# Patient Record
Sex: Female | Born: 1935 | Race: White | Hispanic: No | Marital: Married | Smoking: Former smoker
Health system: Southern US, Community
[De-identification: ages and names within clinical notes are randomized; demographics above are authoritative.]

## PROBLEM LIST (undated history)

## (undated) DIAGNOSIS — N183 Chronic kidney disease, stage 3 unspecified: Secondary | ICD-10-CM

## (undated) DIAGNOSIS — N63 Unspecified lump in unspecified breast: Secondary | ICD-10-CM

## (undated) DIAGNOSIS — R519 Headache, unspecified: Secondary | ICD-10-CM

## (undated) DIAGNOSIS — I10 Essential (primary) hypertension: Secondary | ICD-10-CM

## (undated) DIAGNOSIS — Z8679 Personal history of other diseases of the circulatory system: Secondary | ICD-10-CM

## (undated) DIAGNOSIS — E119 Type 2 diabetes mellitus without complications: Secondary | ICD-10-CM

## (undated) DIAGNOSIS — M797 Fibromyalgia: Secondary | ICD-10-CM

## (undated) DIAGNOSIS — Z932 Ileostomy status: Secondary | ICD-10-CM

## (undated) DIAGNOSIS — D649 Anemia, unspecified: Secondary | ICD-10-CM

## (undated) DIAGNOSIS — R51 Headache: Secondary | ICD-10-CM

## (undated) DIAGNOSIS — F329 Major depressive disorder, single episode, unspecified: Secondary | ICD-10-CM

## (undated) DIAGNOSIS — R079 Chest pain, unspecified: Secondary | ICD-10-CM

## (undated) DIAGNOSIS — E785 Hyperlipidemia, unspecified: Secondary | ICD-10-CM

## (undated) DIAGNOSIS — F32A Depression, unspecified: Secondary | ICD-10-CM

## (undated) DIAGNOSIS — H539 Unspecified visual disturbance: Secondary | ICD-10-CM

## (undated) DIAGNOSIS — Z972 Presence of dental prosthetic device (complete) (partial): Secondary | ICD-10-CM

## (undated) DIAGNOSIS — K219 Gastro-esophageal reflux disease without esophagitis: Secondary | ICD-10-CM

## (undated) DIAGNOSIS — I82419 Acute embolism and thrombosis of unspecified femoral vein: Secondary | ICD-10-CM

## (undated) DIAGNOSIS — C189 Malignant neoplasm of colon, unspecified: Secondary | ICD-10-CM

## (undated) DIAGNOSIS — E039 Hypothyroidism, unspecified: Secondary | ICD-10-CM

## (undated) DIAGNOSIS — M1990 Unspecified osteoarthritis, unspecified site: Secondary | ICD-10-CM

## (undated) DIAGNOSIS — Z8551 Personal history of malignant neoplasm of bladder: Secondary | ICD-10-CM

## (undated) DIAGNOSIS — R001 Bradycardia, unspecified: Secondary | ICD-10-CM

## (undated) DIAGNOSIS — K56609 Unspecified intestinal obstruction, unspecified as to partial versus complete obstruction: Secondary | ICD-10-CM

## (undated) DIAGNOSIS — K220 Achalasia of cardia: Secondary | ICD-10-CM

## (undated) DIAGNOSIS — D500 Iron deficiency anemia secondary to blood loss (chronic): Secondary | ICD-10-CM

## (undated) DIAGNOSIS — K635 Polyp of colon: Secondary | ICD-10-CM

## (undated) DIAGNOSIS — IMO0001 Reserved for inherently not codable concepts without codable children: Secondary | ICD-10-CM

## (undated) DIAGNOSIS — K660 Peritoneal adhesions (postprocedural) (postinfection): Secondary | ICD-10-CM

## (undated) DIAGNOSIS — Z8709 Personal history of other diseases of the respiratory system: Secondary | ICD-10-CM

## (undated) DIAGNOSIS — K469 Unspecified abdominal hernia without obstruction or gangrene: Secondary | ICD-10-CM

## (undated) DIAGNOSIS — Z5189 Encounter for other specified aftercare: Secondary | ICD-10-CM

## (undated) DIAGNOSIS — I447 Left bundle-branch block, unspecified: Secondary | ICD-10-CM

## (undated) HISTORY — DX: Malignant neoplasm of colon, unspecified: C18.9

## (undated) HISTORY — PX: ABDOMINAL HYSTERECTOMY: SHX81

## (undated) HISTORY — DX: Presence of dental prosthetic device (complete) (partial): Z97.2

## (undated) HISTORY — DX: Anemia, unspecified: D64.9

## (undated) HISTORY — PX: APPENDECTOMY: SHX54

## (undated) HISTORY — PX: BREAST SURGERY: SHX581

## (undated) HISTORY — PX: COLON SURGERY: SHX602

## (undated) HISTORY — DX: Chronic kidney disease, stage 3 (moderate): N18.3

## (undated) HISTORY — DX: Bradycardia, unspecified: R00.1

## (undated) HISTORY — DX: Unspecified abdominal hernia without obstruction or gangrene: K46.9

## (undated) HISTORY — DX: Achalasia of cardia: K22.0

## (undated) HISTORY — DX: Unspecified osteoarthritis, unspecified site: M19.90

## (undated) HISTORY — DX: Left bundle-branch block, unspecified: I44.7

## (undated) HISTORY — DX: Hyperlipidemia, unspecified: E78.5

## (undated) HISTORY — DX: Polyp of colon: K63.5

## (undated) HISTORY — DX: Personal history of other diseases of the respiratory system: Z87.09

## (undated) HISTORY — DX: Unspecified lump in unspecified breast: N63.0

## (undated) HISTORY — PX: ILEOSTOMY: SHX1783

## (undated) HISTORY — PX: ROTATOR CUFF REPAIR: SHX139

## (undated) HISTORY — DX: Unspecified intestinal obstruction, unspecified as to partial versus complete obstruction: K56.609

## (undated) HISTORY — PX: CHOLECYSTECTOMY: SHX55

## (undated) HISTORY — DX: Chest pain, unspecified: R07.9

## (undated) HISTORY — DX: Type 2 diabetes mellitus without complications: E11.9

## (undated) HISTORY — DX: Iron deficiency anemia secondary to blood loss (chronic): D50.0

## (undated) HISTORY — DX: Encounter for other specified aftercare: Z51.89

## (undated) HISTORY — PX: TONSILLECTOMY: SUR1361

## (undated) HISTORY — PX: OTHER SURGICAL HISTORY: SHX169

## (undated) HISTORY — DX: Essential (primary) hypertension: I10

## (undated) HISTORY — PX: CORONARY ANGIOPLASTY: SHX604

## (undated) HISTORY — DX: Reserved for inherently not codable concepts without codable children: IMO0001

## (undated) HISTORY — DX: Personal history of malignant neoplasm of bladder: Z85.51

## (undated) HISTORY — DX: Hypothyroidism, unspecified: E03.9

## (undated) HISTORY — DX: Acute embolism and thrombosis of unspecified femoral vein: I82.419

## (undated) HISTORY — DX: Personal history of other diseases of the circulatory system: Z86.79

## (undated) HISTORY — DX: Chronic kidney disease, stage 3 unspecified: N18.30

---

## 2000-12-10 ENCOUNTER — Ambulatory Visit (HOSPITAL_COMMUNITY): Admission: RE | Admit: 2000-12-10 | Discharge: 2000-12-10 | Payer: Self-pay | Admitting: Cardiology

## 2000-12-10 HISTORY — PX: CARDIAC CATHETERIZATION: SHX172

## 2002-05-24 ENCOUNTER — Other Ambulatory Visit: Admission: RE | Admit: 2002-05-24 | Discharge: 2002-05-24 | Payer: Self-pay | Admitting: Orthopaedic Surgery

## 2002-08-25 ENCOUNTER — Ambulatory Visit (HOSPITAL_COMMUNITY): Admission: RE | Admit: 2002-08-25 | Discharge: 2002-08-25 | Payer: Self-pay | Admitting: Orthopaedic Surgery

## 2002-12-20 ENCOUNTER — Other Ambulatory Visit: Admission: RE | Admit: 2002-12-20 | Discharge: 2002-12-20 | Payer: Self-pay | Admitting: Orthopaedic Surgery

## 2003-03-03 ENCOUNTER — Other Ambulatory Visit: Admission: RE | Admit: 2003-03-03 | Discharge: 2003-03-03 | Payer: Self-pay | Admitting: Orthopaedic Surgery

## 2004-07-26 ENCOUNTER — Ambulatory Visit (HOSPITAL_COMMUNITY): Admission: RE | Admit: 2004-07-26 | Discharge: 2004-07-26 | Payer: Self-pay | Admitting: Orthopaedic Surgery

## 2006-02-10 HISTORY — PX: JOINT REPLACEMENT: SHX530

## 2006-07-02 ENCOUNTER — Inpatient Hospital Stay (HOSPITAL_COMMUNITY): Admission: RE | Admit: 2006-07-02 | Discharge: 2006-07-07 | Payer: Self-pay | Admitting: Orthopedic Surgery

## 2006-07-07 ENCOUNTER — Inpatient Hospital Stay: Admission: AD | Admit: 2006-07-07 | Discharge: 2006-07-09 | Payer: Self-pay | Admitting: Internal Medicine

## 2007-04-13 ENCOUNTER — Encounter: Admission: RE | Admit: 2007-04-13 | Discharge: 2007-04-13 | Payer: Self-pay | Admitting: Gastroenterology

## 2007-05-05 ENCOUNTER — Ambulatory Visit (HOSPITAL_COMMUNITY): Admission: RE | Admit: 2007-05-05 | Discharge: 2007-05-05 | Payer: Self-pay | Admitting: Gastroenterology

## 2007-05-05 ENCOUNTER — Encounter (INDEPENDENT_AMBULATORY_CARE_PROVIDER_SITE_OTHER): Payer: Self-pay | Admitting: Gastroenterology

## 2007-05-18 ENCOUNTER — Encounter: Admission: RE | Admit: 2007-05-18 | Discharge: 2007-05-18 | Payer: Self-pay | Admitting: Cardiology

## 2007-05-28 ENCOUNTER — Encounter: Admission: RE | Admit: 2007-05-28 | Discharge: 2007-05-28 | Payer: Self-pay | Admitting: Cardiology

## 2007-06-11 ENCOUNTER — Encounter (INDEPENDENT_AMBULATORY_CARE_PROVIDER_SITE_OTHER): Payer: Self-pay | Admitting: Interventional Radiology

## 2007-06-11 ENCOUNTER — Encounter: Admission: RE | Admit: 2007-06-11 | Discharge: 2007-06-11 | Payer: Self-pay | Admitting: Otolaryngology

## 2007-06-11 ENCOUNTER — Other Ambulatory Visit: Admission: RE | Admit: 2007-06-11 | Discharge: 2007-06-11 | Payer: Self-pay | Admitting: Interventional Radiology

## 2007-07-12 ENCOUNTER — Encounter: Admission: RE | Admit: 2007-07-12 | Discharge: 2007-07-12 | Payer: Self-pay | Admitting: Orthopedic Surgery

## 2007-08-25 ENCOUNTER — Encounter: Admission: RE | Admit: 2007-08-25 | Discharge: 2007-08-25 | Payer: Self-pay | Admitting: Gastroenterology

## 2008-08-21 ENCOUNTER — Inpatient Hospital Stay (HOSPITAL_COMMUNITY): Admission: EM | Admit: 2008-08-21 | Discharge: 2008-11-01 | Payer: Self-pay | Admitting: Pulmonary Disease

## 2008-08-21 ENCOUNTER — Ambulatory Visit: Payer: Self-pay | Admitting: Pulmonary Disease

## 2008-08-21 ENCOUNTER — Encounter (INDEPENDENT_AMBULATORY_CARE_PROVIDER_SITE_OTHER): Payer: Self-pay | Admitting: General Surgery

## 2008-08-29 ENCOUNTER — Ambulatory Visit: Payer: Self-pay | Admitting: Physical Medicine & Rehabilitation

## 2008-09-02 ENCOUNTER — Encounter (INDEPENDENT_AMBULATORY_CARE_PROVIDER_SITE_OTHER): Payer: Self-pay | Admitting: Internal Medicine

## 2008-09-03 ENCOUNTER — Ambulatory Visit: Payer: Self-pay | Admitting: Infectious Diseases

## 2008-09-04 ENCOUNTER — Encounter: Payer: Self-pay | Admitting: Cardiology

## 2008-11-06 ENCOUNTER — Ambulatory Visit (HOSPITAL_COMMUNITY): Admission: RE | Admit: 2008-11-06 | Discharge: 2008-11-06 | Payer: Self-pay | Admitting: General Surgery

## 2008-11-24 ENCOUNTER — Inpatient Hospital Stay (HOSPITAL_COMMUNITY): Admission: EM | Admit: 2008-11-24 | Discharge: 2008-12-05 | Payer: Self-pay | Admitting: Emergency Medicine

## 2008-11-25 ENCOUNTER — Ambulatory Visit: Payer: Self-pay | Admitting: Vascular Surgery

## 2008-11-25 ENCOUNTER — Encounter: Payer: Self-pay | Admitting: Cardiology

## 2008-11-28 ENCOUNTER — Encounter: Payer: Self-pay | Admitting: Cardiology

## 2008-12-20 ENCOUNTER — Ambulatory Visit (HOSPITAL_COMMUNITY): Admission: RE | Admit: 2008-12-20 | Discharge: 2008-12-20 | Payer: Self-pay | Admitting: General Surgery

## 2009-01-08 ENCOUNTER — Ambulatory Visit (HOSPITAL_COMMUNITY): Admission: RE | Admit: 2009-01-08 | Discharge: 2009-01-08 | Payer: Self-pay | Admitting: General Surgery

## 2009-02-16 ENCOUNTER — Encounter: Admission: RE | Admit: 2009-02-16 | Discharge: 2009-02-16 | Payer: Self-pay | Admitting: General Surgery

## 2009-08-03 ENCOUNTER — Ambulatory Visit: Payer: Self-pay | Admitting: Genetic Counselor

## 2009-09-20 ENCOUNTER — Ambulatory Visit: Payer: Self-pay | Admitting: Cardiology

## 2009-10-25 IMAGING — CR DG SINUS / FISTULA TRACT / ABSCESSOGRAM
1 series · 1 of 1 positions shown · non-contrast
Comparison: CT 09/04/2008.

CLINICAL DATA: History given of sepsis, colon carcinoma and
fistula.

SINUS TRACT INJECTION/FISTULOGRAM
TECHNIQUE: A Jackson-Pratt drain has been previously placed into
the percutaneous fistulous tract. A total of 50ml of Omnipaquewere
slowly injected. Spot film images were obtained.
Fluoroscopy Time: 2.3 minutes.

[t abdomen supine]
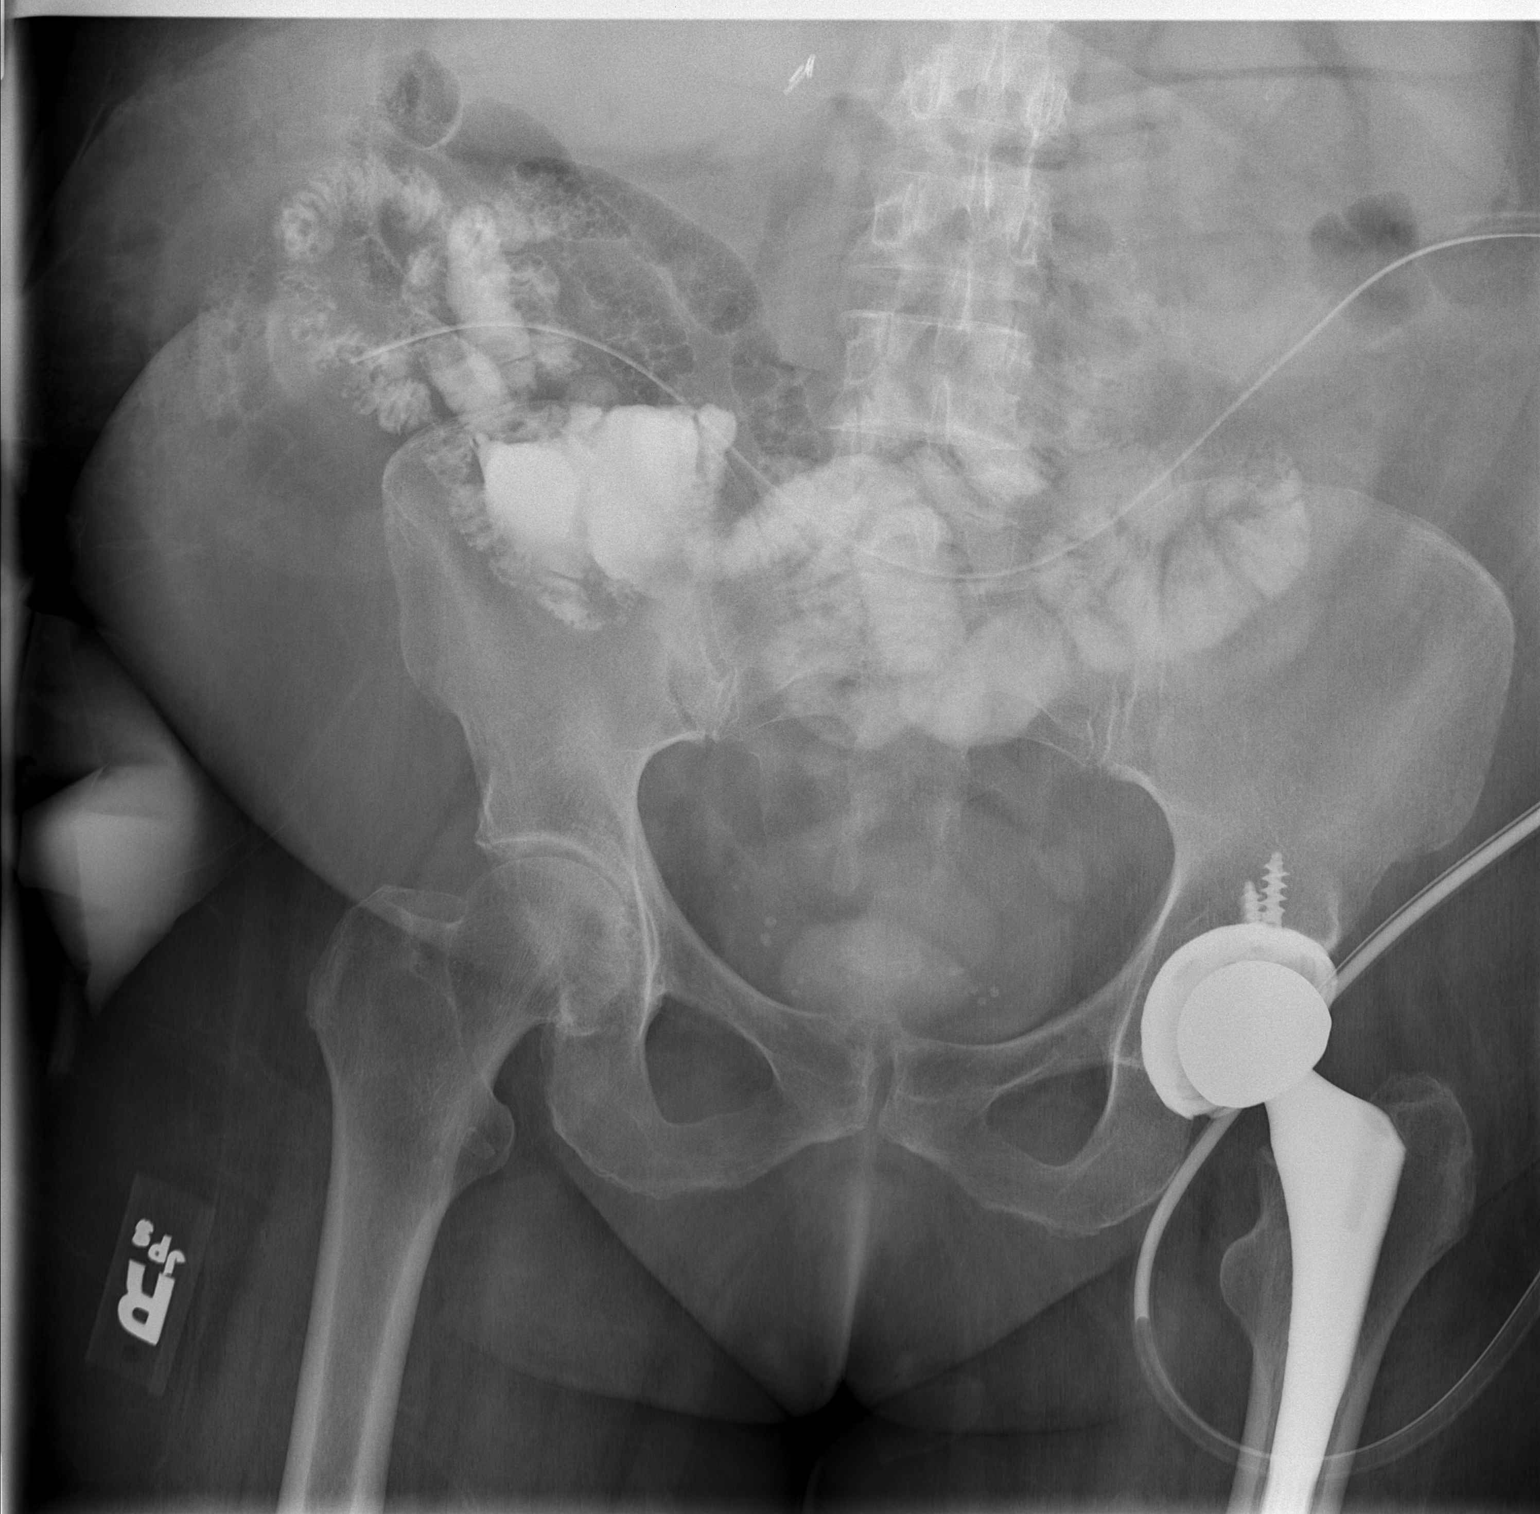

[1 of 1 positions shown; findings below may reference images not displayed]

FINDINGS: The catheter which was attached to the Jackson-Pratt
drain on the left was injected with contrast .  There was immediate
filling of loops of jejunum in the left lower abdomen.  With
further injection of contrast loops of ileum were separately
opacified in the right lower quadrant.  Delayed images were also
obtained again demonstrating connections with intestine.
IMPRESSION: Enterocutaneous fistulae.  With a single injection there is
connection with jejunum in the left lower quadrant and ileum in the
right lower quadrant.

## 2009-11-02 IMAGING — CR DG HIP (WITH OR WITHOUT PELVIS) 2-3V*L*
3 series · 3 of 3 positions shown · non-contrast
Comparison: Left hip radiographs performed 07/02/2006, and
abdominal radiograph performed 10/24/2008

CLINICAL DATA: Left hip pain; history of colon cancer and sepsis.

LEFT HIP - COMPLETE 2+ VIEW

[t pelvis a.p.]
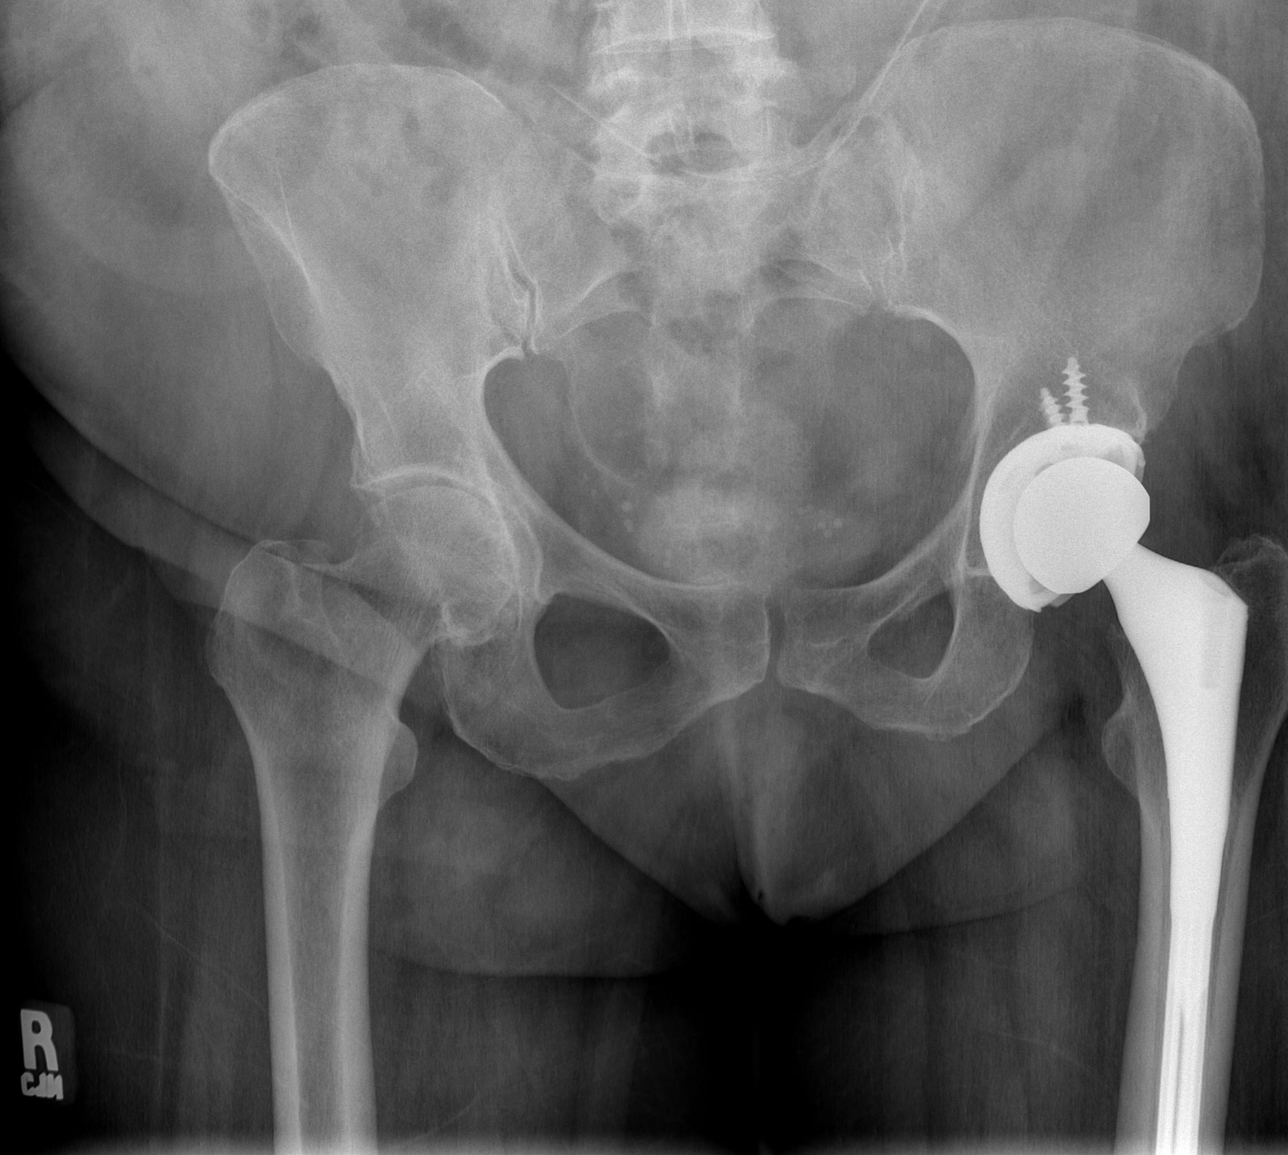

[t hip ap left]
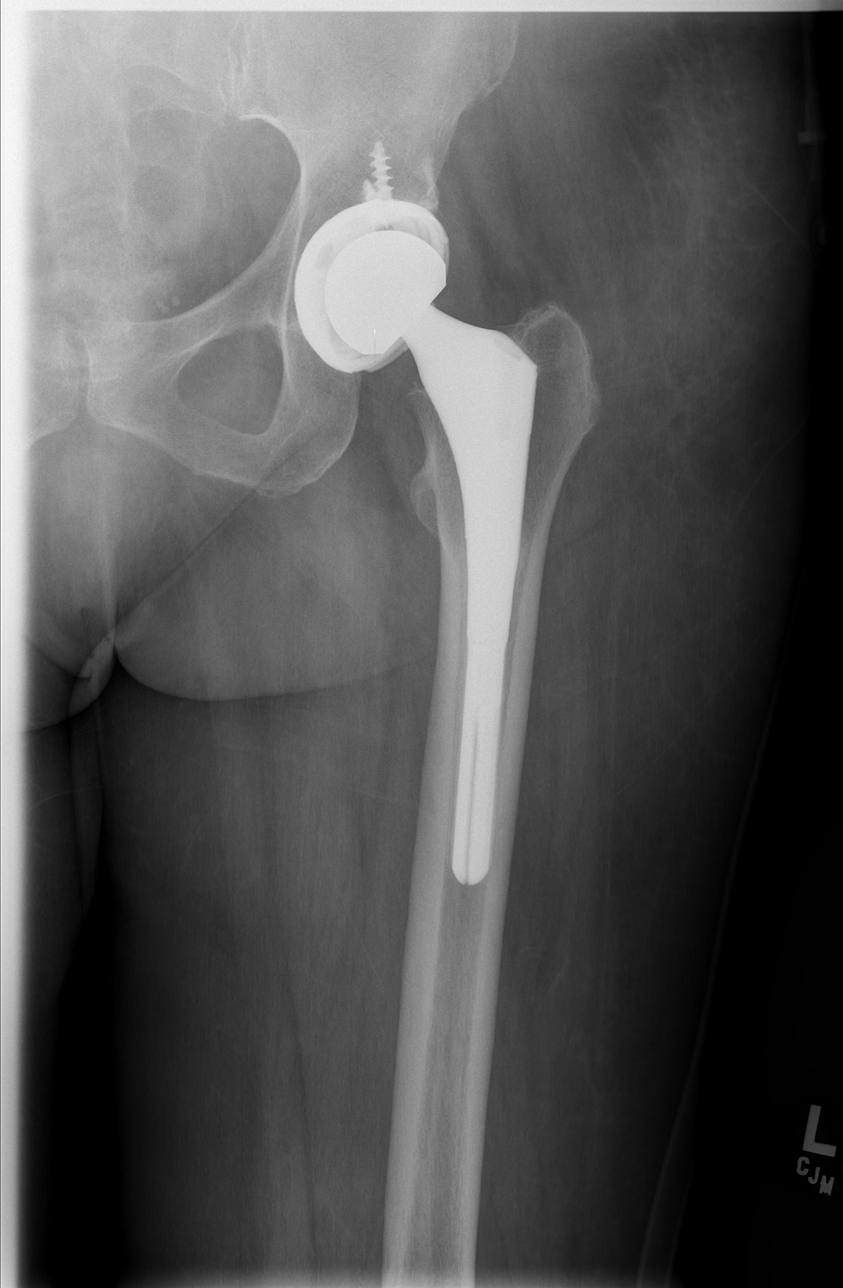

[t hip frog leg left]
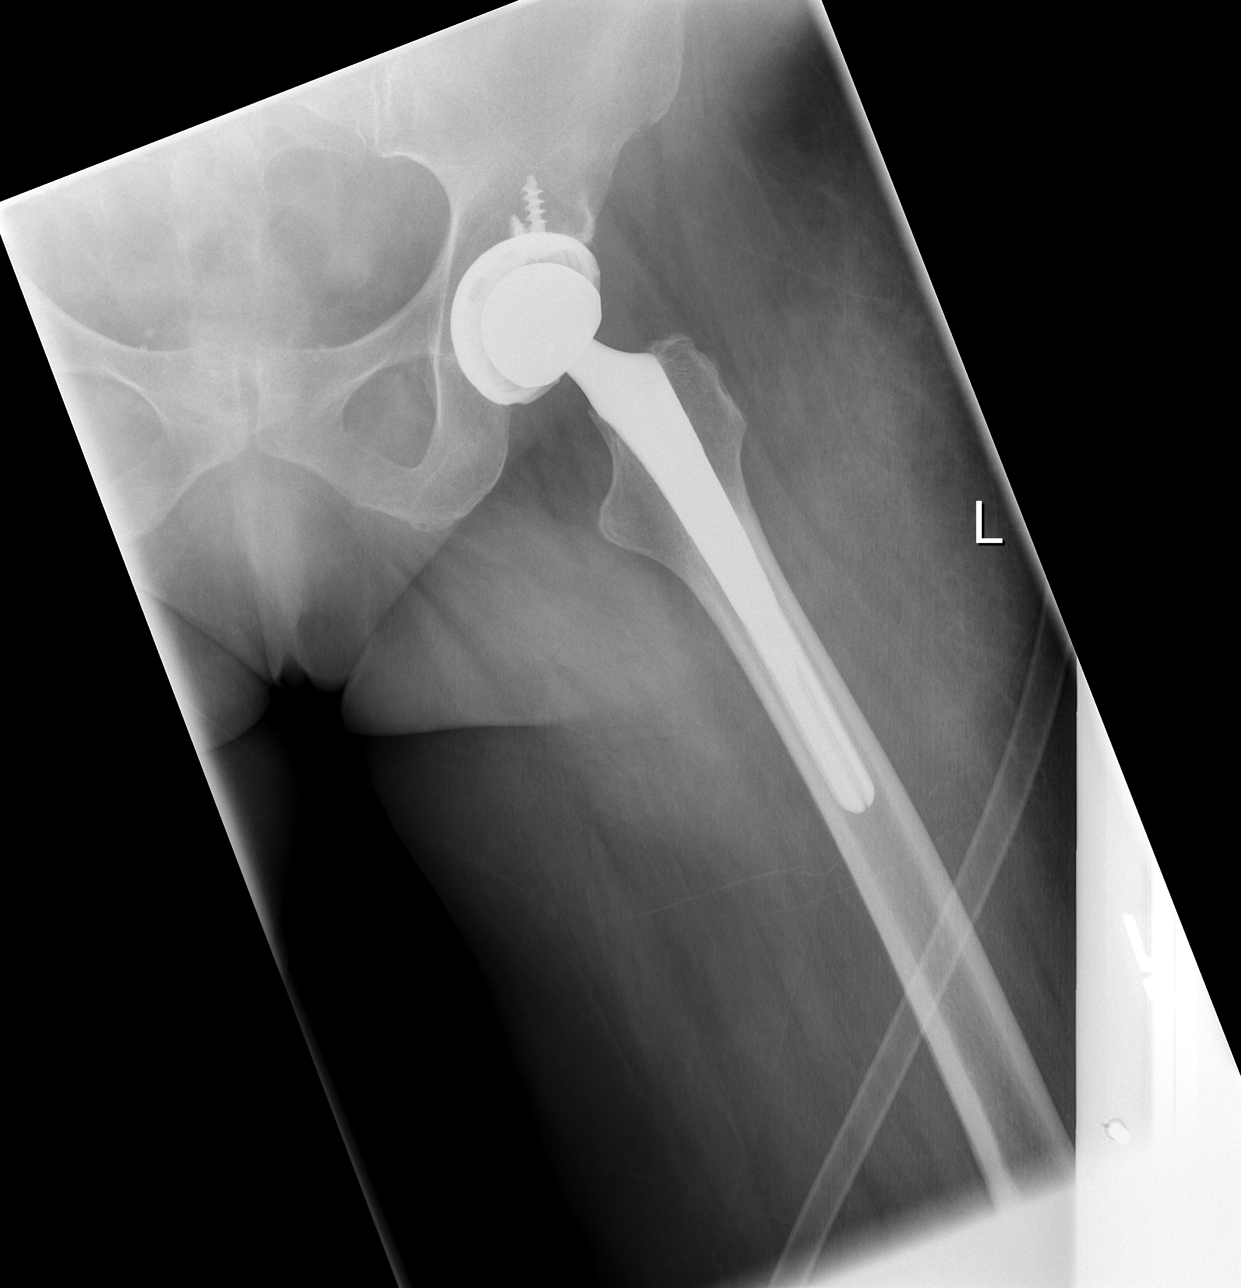

[3 of 3 positions shown; findings below may reference images not displayed]

FINDINGS: The patient's left total hip arthroplasty is unchanged in
appearance.  There is no evidence of loosening or fracture.  The
femoral stem is unremarkable.  No significant soft tissue
abnormalities are seen.  Axial narrowing is noted at the right hip,
reflecting degenerative change.

An abdominal drain is unchanged in appearance from the prior
abdominal radiograph. The visualized bowel gas pattern is grossly
unremarkable.
IMPRESSION: No evidence of fracture; left total hip arthroplasty unremarkable
in appearance.

## 2009-11-27 ENCOUNTER — Ambulatory Visit: Payer: Self-pay | Admitting: Cardiology

## 2010-01-14 ENCOUNTER — Ambulatory Visit: Payer: Self-pay | Admitting: Internal Medicine

## 2010-01-15 IMAGING — XA IR FISTULA/SINUS TRACT
1 series · 6 of 6 positions shown · non-contrast
Comparison: none

CLINICAL HISTORY: History of small bowel fistula.

[Series 1: run · 6 of 6 slices shown]
[im 1/6]
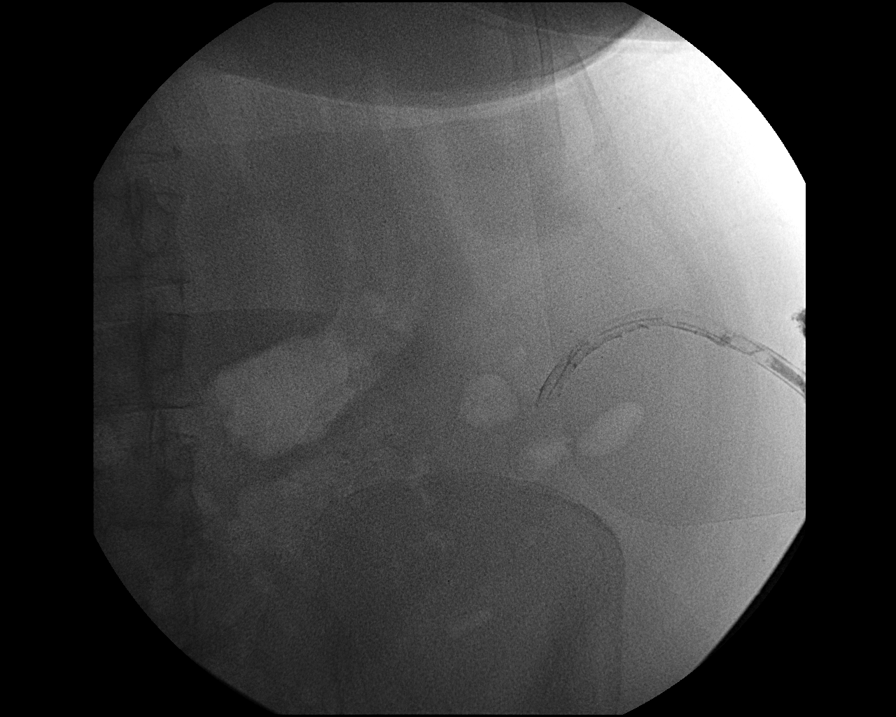
[im 2/6]
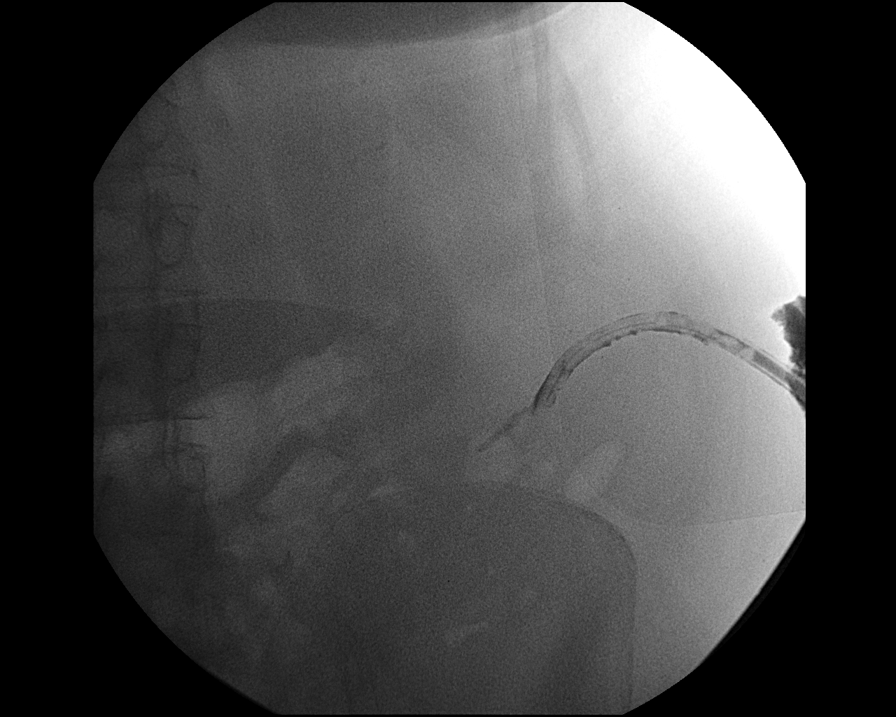
[im 3/6]
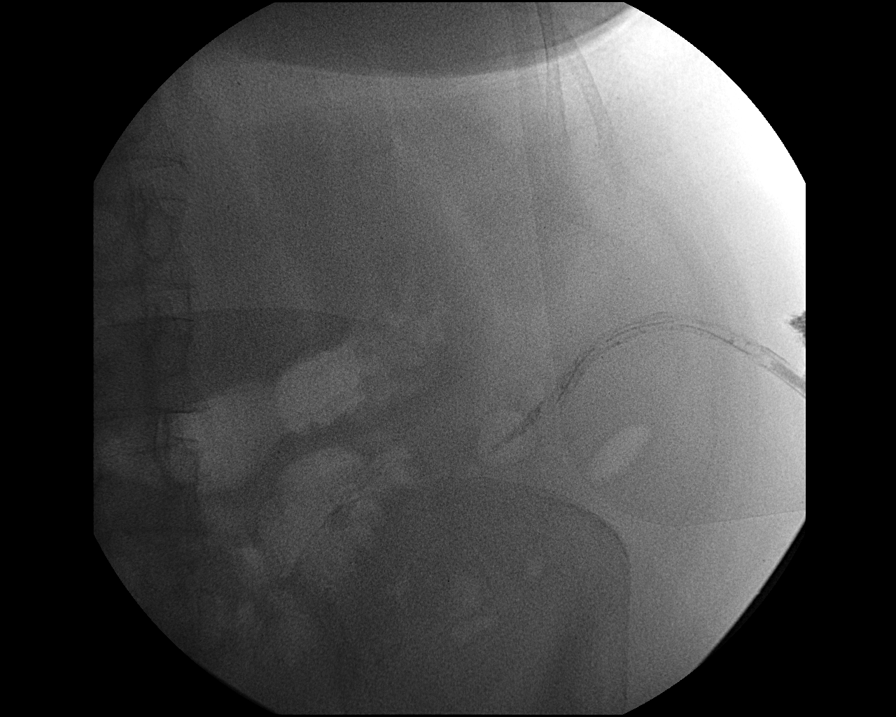
[im 4/6]
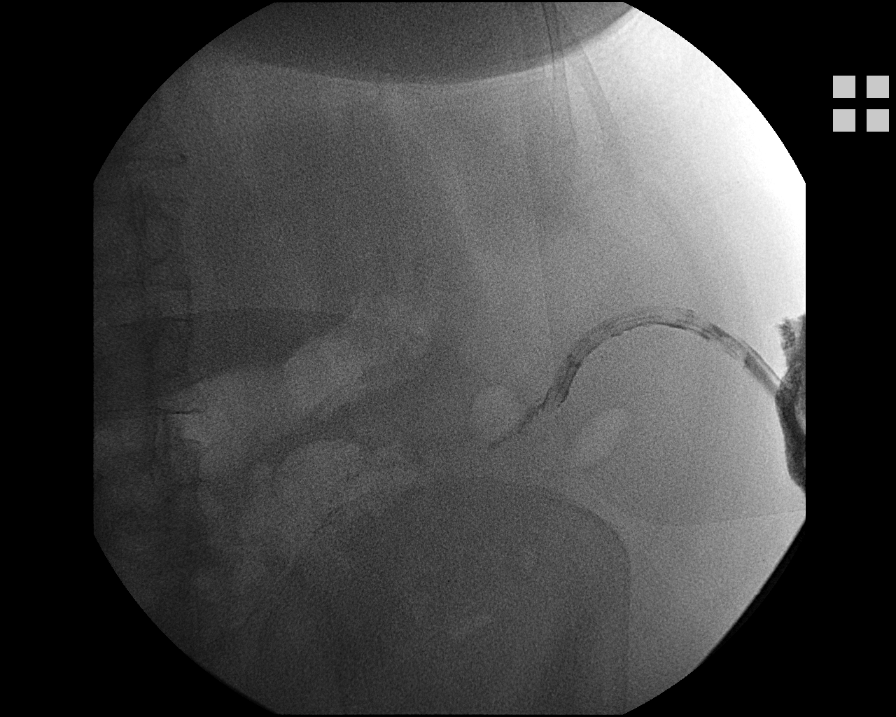
[im 5/6]
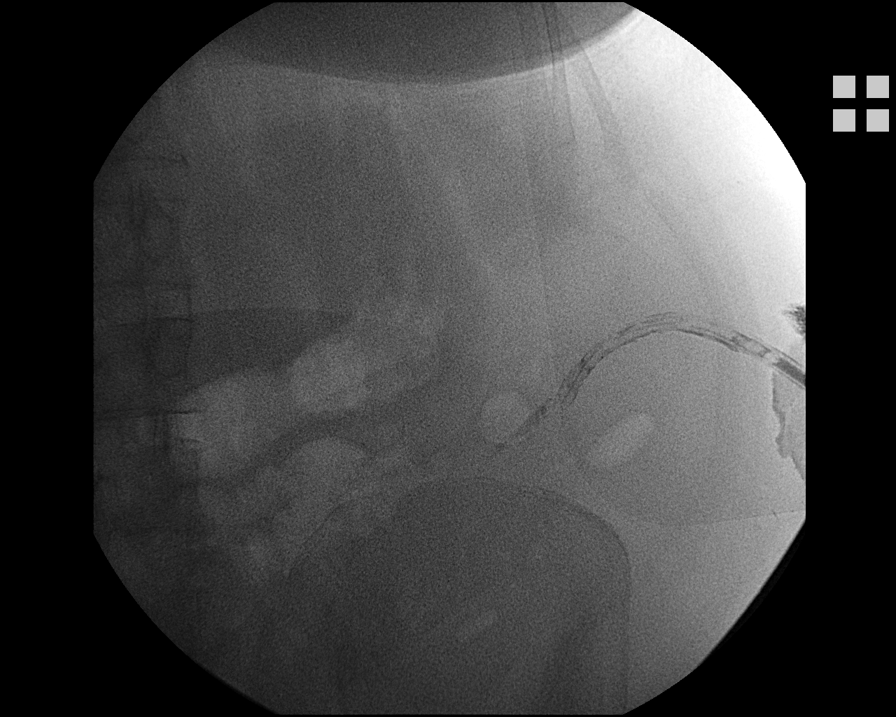
[im 6/6]
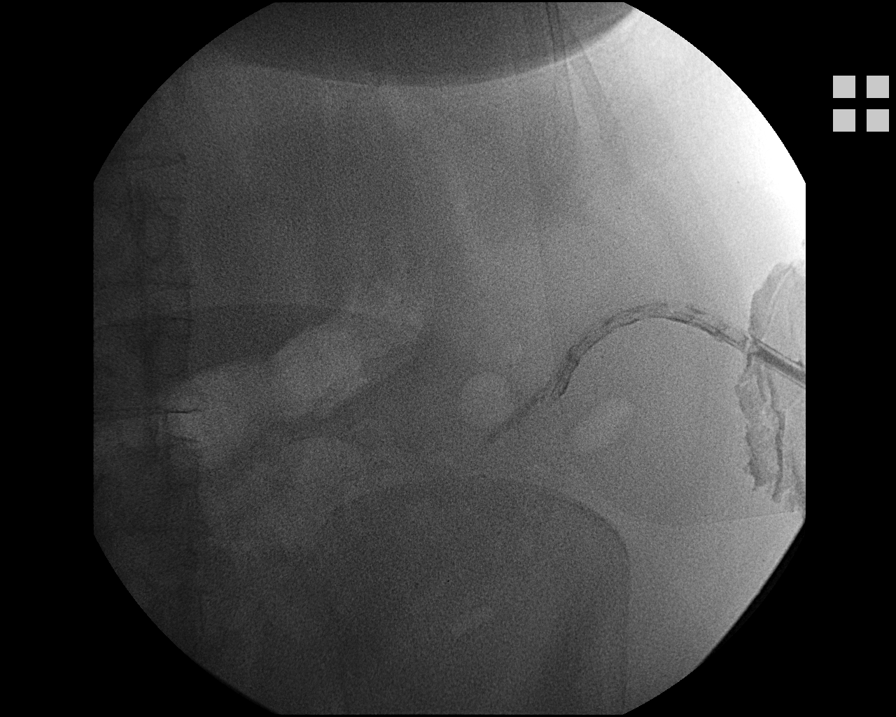

[6 of 6 positions shown; findings below may reference images not displayed]

PROCEDURE(S): FISTULOGRAM THROUGH EXISTING CATHETER

Medications:None

Moderate sedation time:None

Fluoroscopy time: 0.9 minutes

Contrast:  15 ml Fmnipaque-6NN

Procedure:The patient was placed supine on the interventional
table.  Scout film was obtained of the left abdomen.  15 ml of
contrast was injected under fluoroscopic guidance.  The patient was
taken to the nursing station.  The catheter suture was cut and the
catheter was removed.
FINDINGS: Multiple filling defects within the left abdominal drain
and a large amount of the contrast leaked around the skin.  Small
amount of contrast was injected into the abdomen and there was no
connection to the small bowel.
IMPRESSION: Partially occluded left abdominal drain.  No evidence
for a fistula tract to the small bowel.

Findings were discussed with Dr. Petraina and the catheter was
removed.

## 2010-03-03 ENCOUNTER — Encounter: Payer: Self-pay | Admitting: General Surgery

## 2010-03-04 ENCOUNTER — Ambulatory Visit: Payer: Self-pay | Admitting: Cardiology

## 2010-04-03 ENCOUNTER — Other Ambulatory Visit (HOSPITAL_COMMUNITY)
Admission: RE | Admit: 2010-04-03 | Discharge: 2010-04-03 | Disposition: A | Discharge status: Home / Self Care | Payer: Medicare Other | Source: Ambulatory Visit | Attending: Orthopaedic Surgery | Admitting: Orthopaedic Surgery

## 2010-04-03 ENCOUNTER — Other Ambulatory Visit: Payer: Self-pay | Admitting: Orthopaedic Surgery

## 2010-04-03 DIAGNOSIS — M6530 Trigger finger, unspecified finger: Secondary | ICD-10-CM | POA: Insufficient documentation

## 2010-04-15 ENCOUNTER — Inpatient Hospital Stay (HOSPITAL_COMMUNITY)
Admission: AD | Admit: 2010-04-15 | Discharge: 2010-04-19 | DRG: 389 | Disposition: A | Discharge status: Home / Self Care | Payer: Medicare Other | Source: Other Acute Inpatient Hospital | Attending: Surgery | Admitting: Surgery

## 2010-04-15 DIAGNOSIS — E039 Hypothyroidism, unspecified: Secondary | ICD-10-CM | POA: Diagnosis present

## 2010-04-15 DIAGNOSIS — I2510 Atherosclerotic heart disease of native coronary artery without angina pectoris: Secondary | ICD-10-CM | POA: Diagnosis present

## 2010-04-15 DIAGNOSIS — F411 Generalized anxiety disorder: Secondary | ICD-10-CM | POA: Diagnosis present

## 2010-04-15 DIAGNOSIS — N390 Urinary tract infection, site not specified: Secondary | ICD-10-CM | POA: Diagnosis not present

## 2010-04-15 DIAGNOSIS — E119 Type 2 diabetes mellitus without complications: Secondary | ICD-10-CM | POA: Diagnosis present

## 2010-04-15 DIAGNOSIS — K56609 Unspecified intestinal obstruction, unspecified as to partial versus complete obstruction: Principal | ICD-10-CM | POA: Diagnosis present

## 2010-04-15 DIAGNOSIS — Z8551 Personal history of malignant neoplasm of bladder: Secondary | ICD-10-CM

## 2010-04-15 DIAGNOSIS — E785 Hyperlipidemia, unspecified: Secondary | ICD-10-CM | POA: Diagnosis present

## 2010-04-15 DIAGNOSIS — Z85038 Personal history of other malignant neoplasm of large intestine: Secondary | ICD-10-CM

## 2010-04-15 DIAGNOSIS — Z932 Ileostomy status: Secondary | ICD-10-CM

## 2010-04-15 DIAGNOSIS — I10 Essential (primary) hypertension: Secondary | ICD-10-CM | POA: Diagnosis present

## 2010-04-15 DIAGNOSIS — K219 Gastro-esophageal reflux disease without esophagitis: Secondary | ICD-10-CM | POA: Diagnosis present

## 2010-04-15 DIAGNOSIS — N179 Acute kidney failure, unspecified: Secondary | ICD-10-CM | POA: Diagnosis present

## 2010-04-15 LAB — GLUCOSE, CAPILLARY: Glucose-Capillary: 127 mg/dL — ABNORMAL HIGH (ref 70–99)

## 2010-04-16 ENCOUNTER — Inpatient Hospital Stay (HOSPITAL_COMMUNITY): Payer: Medicare Other

## 2010-04-16 LAB — BASIC METABOLIC PANEL
BUN: 26 mg/dL — ABNORMAL HIGH (ref 6–23)
CO2: 26 mEq/L (ref 19–32)
Chloride: 108 mEq/L (ref 96–112)
Creatinine, Ser: 1.09 mg/dL (ref 0.4–1.2)
Glucose, Bld: 124 mg/dL — ABNORMAL HIGH (ref 70–99)

## 2010-04-16 LAB — CBC
HCT: 38.6 % (ref 36.0–46.0)
Hemoglobin: 12.5 g/dL (ref 12.0–15.0)
MCH: 28.7 pg (ref 26.0–34.0)
MCV: 88.7 fL (ref 78.0–100.0)
Platelets: 211 10*3/uL (ref 150–400)
RBC: 4.35 MIL/uL (ref 3.87–5.11)

## 2010-04-17 ENCOUNTER — Inpatient Hospital Stay (HOSPITAL_COMMUNITY): Payer: Medicare Other

## 2010-04-17 LAB — URINALYSIS, ROUTINE W REFLEX MICROSCOPIC
Glucose, UA: NEGATIVE mg/dL
Specific Gravity, Urine: 1.018 (ref 1.005–1.030)

## 2010-04-17 LAB — URINE MICROSCOPIC-ADD ON

## 2010-04-18 LAB — GLUCOSE, CAPILLARY
Glucose-Capillary: 130 mg/dL — ABNORMAL HIGH (ref 70–99)
Glucose-Capillary: 119 mg/dL — ABNORMAL HIGH (ref 70–99)

## 2010-04-20 NOTE — H&P (Signed)
NAME:  Karen [name], Karen [name]               ACCOUNT NO.:  [id number]  MEDICAL RECORD NO.:  [id number]           PATIENT TYPE:  I  LOCATION:  5508                         FACILITY:  [facility]  PHYSICIAN:  Adolph Pollack, M.D.DATE OF BIRTH:  [birth year]  DATE OF ADMISSION:  04/15/2010 DATE OF DISCHARGE:                             HISTORY & PHYSICAL   REASON:  Small-bowel obstruction.  HISTORY:  This is a 77 year old female with very complex abdominal surgical history who was in her normal state of health and awoke this morning with some upper abdominal pain that persisted.  She had bloating and decreased ileostomy output.  She began having some nausea.  She presented to [facility] emergency department, was evaluated there, and found to have a small bowel obstruction.  At patient's request she was transferred down here for further evaluation and treatment.  She states, since she has been here, she has had a little bit of output start from the ileostomy.  She had an NG tube placed at [facility].  Family says she is less distended now.  PAST MEDICAL HISTORY:  Coronary artery disease, hypertension, hypothyroidism, type 2 diabetes mellitus, colon cancer, gastroesophageal reflux disease, anxiety disorder, bladder cancer, dyslipidemia, intracutaneous fistulae, bowel perforation, acute renal failure, and ventilatory-dependent respiratory failure.  PREVIOUS OPERATIONS:  Right colectomy, left hemicolectomy, TAH/BSO, TURBT, right shoulder surgery, bilateral carpal tunnel release, small bowel resection with ileostomy, repair of anastomotic leak, open abdominal wound closure, right hand surgery, and left total hip replacement.  ALLERGIES:  CODEINE, DEMEROL, MORPHINE, PENTAZOCINE, NITROFURANTOIN, NOVOCAIN.  MEDICATIONS: 1. Acyclovir. 2. Alprazolam. 3. Aspirin. 4. Vicodin. 5. Digoxin. 6. Multivitamin. 7. L-thyroxine. 8. Folate. 9. Diovan. 10.Coreg. 11.Omeprazole.  SOCIAL HISTORY:   She is married,  lives with her husband.  Is currently nonsmoker, nondrinker.  REVIEW OF SYSTEMS:  GENERAL:  She denies any fever or chills. CARDIOVASCULAR:  No chest pain.  RESPIRATORY:  No shortness of breath. GI: She states she ate some raw vegetables and a hot dog yesterday.  She has an intermittent mucoid bowel movement.  GU:  No dysuria or hematuria.  PHYSICAL EXAMINATION:  GENERAL:  An overweight female in no acute distress, pleasant and cooperative.  NG tube in. VITAL SIGNS:  Temperature is 98.3, blood pressure is 184/65, pulse 78, and respiratory rate 20. NECK:  Supple without masses. CV:  Regular rate and regular rhythm, no murmurs. LUNGS:  Clear. ABDOMEN:  Soft.  Minimal tenderness present.  There is a transverse upper abdominal scar and a lower midline scar without obvious hernia although some weakness of the abdominal wall in the transverse upper abdominal scar.  There is a right-sided ileostomy present with a little bit of pasty output at the stoma site and a little bit of liquid in the bag.  Intermittent high-pitched bowel sounds are heard. MUSCULOSKELETAL:  No edema present.  LABORATORY DATA:  Electrolytes within normal limits except for glucose of 107.  White count 6700, hemoglobin 11.8.  Albumin 4.0.  Triglycerides elevated at 296.  Urinalysis negative.  CT scan was reviewed and showed dilated small-bowel loops consistent with small-bowel obstruction.  There was no free air  present.  IMPRESSION:  Small-bowel obstruction, most likely adhesive in nature. Also, has multiple other medical problems which appear to be well controlled at this time.  PLAN:  We will admit to the hospital and continue medical management with NG tube decompression and hydration.  Repeat lab and x-ray in the morning.  This would be a very difficult abdominal surgery, and I think only as very last resort would we recommend exploratory laparotomy to her, and this has been explained to her  and her family and they understand this.     Adolph Pollack, M.D.     Kari Baars  D:  04/15/2010  T:  04/16/2010  Job:  [id number]  Electronically Signed by Avel Peace M.D. on 04/20/2010 09:37:20 PM

## 2010-04-21 ENCOUNTER — Emergency Department (HOSPITAL_COMMUNITY): Payer: Medicare Other

## 2010-04-21 ENCOUNTER — Inpatient Hospital Stay (HOSPITAL_COMMUNITY)
Admission: EM | Admit: 2010-04-21 | Discharge: 2010-04-25 | DRG: 389 | Disposition: A | Discharge status: Home / Self Care | Payer: Medicare Other | Attending: Surgery | Admitting: Surgery

## 2010-04-21 DIAGNOSIS — Z7982 Long term (current) use of aspirin: Secondary | ICD-10-CM

## 2010-04-21 DIAGNOSIS — K219 Gastro-esophageal reflux disease without esophagitis: Secondary | ICD-10-CM | POA: Diagnosis present

## 2010-04-21 DIAGNOSIS — N289 Disorder of kidney and ureter, unspecified: Secondary | ICD-10-CM | POA: Diagnosis present

## 2010-04-21 DIAGNOSIS — E785 Hyperlipidemia, unspecified: Secondary | ICD-10-CM | POA: Diagnosis present

## 2010-04-21 DIAGNOSIS — Z8551 Personal history of malignant neoplasm of bladder: Secondary | ICD-10-CM

## 2010-04-21 DIAGNOSIS — I5032 Chronic diastolic (congestive) heart failure: Secondary | ICD-10-CM | POA: Diagnosis present

## 2010-04-21 DIAGNOSIS — E039 Hypothyroidism, unspecified: Secondary | ICD-10-CM | POA: Diagnosis present

## 2010-04-21 DIAGNOSIS — I509 Heart failure, unspecified: Secondary | ICD-10-CM | POA: Diagnosis present

## 2010-04-21 DIAGNOSIS — Z85038 Personal history of other malignant neoplasm of large intestine: Secondary | ICD-10-CM

## 2010-04-21 DIAGNOSIS — K56609 Unspecified intestinal obstruction, unspecified as to partial versus complete obstruction: Principal | ICD-10-CM | POA: Diagnosis present

## 2010-04-21 DIAGNOSIS — I10 Essential (primary) hypertension: Secondary | ICD-10-CM | POA: Diagnosis present

## 2010-04-21 DIAGNOSIS — F411 Generalized anxiety disorder: Secondary | ICD-10-CM | POA: Diagnosis present

## 2010-04-21 DIAGNOSIS — I2510 Atherosclerotic heart disease of native coronary artery without angina pectoris: Secondary | ICD-10-CM | POA: Diagnosis present

## 2010-04-21 LAB — POCT CARDIAC MARKERS
Myoglobin, poc: 98.9 ng/mL (ref 12–200)
Troponin i, poc: <0.05 ng/mL (ref 0.00–0.09)

## 2010-04-21 LAB — URINALYSIS, ROUTINE W REFLEX MICROSCOPIC
Glucose, UA: NEGATIVE mg/dL
pH: 5.5 (ref 5.0–8.0)

## 2010-04-21 LAB — CBC
MCH: 28.5 pg (ref 26.0–34.0)
MCV: 88.4 fL (ref 78.0–100.0)
Platelets: 207 10*3/uL (ref 150–400)
RBC: 3.97 MIL/uL (ref 3.87–5.11)
RDW: 13.7 % (ref 11.5–15.5)

## 2010-04-21 LAB — DIFFERENTIAL
Basophils Relative: 0 % (ref 0–1)
Eosinophils Absolute: 0.1 10*3/uL (ref 0.0–0.7)
Eosinophils Relative: 2 % (ref 0–5)
Lymphs Abs: 2.2 10*3/uL (ref 0.7–4.0)
Monocytes Relative: 10 % (ref 3–12)
Neutrophils Relative %: 58 % (ref 43–77)

## 2010-04-21 LAB — COMPREHENSIVE METABOLIC PANEL
AST: 28 U/L (ref 0–37)
Albumin: 3.7 g/dL (ref 3.5–5.2)
BUN: 31 mg/dL — ABNORMAL HIGH (ref 6–23)
Calcium: 9.1 mg/dL (ref 8.4–10.5)
Chloride: 110 mEq/L (ref 96–112)
Creatinine, Ser: 1.62 mg/dL — ABNORMAL HIGH (ref 0.4–1.2)
GFR calc Af Amer: 37 mL/min — ABNORMAL LOW (ref >60)
Total Protein: 6.9 g/dL (ref 6.0–8.3)

## 2010-04-21 MED ORDER — IOHEXOL 300 MG/ML  SOLN
75.0000 mL | Freq: Once | INTRAMUSCULAR | Status: AC | PRN
  Administered 2010-04-21: 75 mL via INTRAVENOUS

## 2010-04-22 ENCOUNTER — Inpatient Hospital Stay (HOSPITAL_COMMUNITY): Payer: Medicare Other

## 2010-04-22 LAB — BASIC METABOLIC PANEL
CO2: 24 mEq/L (ref 19–32)
Glucose, Bld: 117 mg/dL — ABNORMAL HIGH (ref 70–99)
Potassium: 4 mEq/L (ref 3.5–5.1)
Sodium: 139 mEq/L (ref 135–145)

## 2010-04-22 LAB — CBC
HCT: 34.1 % — ABNORMAL LOW (ref 36.0–46.0)
Hemoglobin: 11 g/dL — ABNORMAL LOW (ref 12.0–15.0)
RBC: 3.88 MIL/uL (ref 3.87–5.11)
WBC: 5.5 10*3/uL (ref 4.0–10.5)

## 2010-04-22 MED ORDER — IOHEXOL 300 MG/ML  SOLN
10.0000 mL | Freq: Once | INTRAMUSCULAR | Status: AC | PRN
  Administered 2010-04-22: 10 mL via INTRAVENOUS

## 2010-04-23 LAB — COMPREHENSIVE METABOLIC PANEL
ALT: 17 U/L (ref 0–35)
Albumin: 3 g/dL — ABNORMAL LOW (ref 3.5–5.2)
Alkaline Phosphatase: 73 U/L (ref 39–117)
BUN: 5 mg/dL — ABNORMAL LOW (ref 6–23)
Chloride: 111 mEq/L (ref 96–112)
Glucose, Bld: 109 mg/dL — ABNORMAL HIGH (ref 70–99)
Potassium: 3.6 mEq/L (ref 3.5–5.1)
Sodium: 142 mEq/L (ref 135–145)
Total Bilirubin: 0.4 mg/dL (ref 0.3–1.2)

## 2010-04-23 LAB — CBC
HCT: 31.3 % — ABNORMAL LOW (ref 36.0–46.0)
MCV: 87.9 fL (ref 78.0–100.0)
Platelets: 163 10*3/uL (ref 150–400)
RBC: 3.56 MIL/uL — ABNORMAL LOW (ref 3.87–5.11)
WBC: 5.6 10*3/uL (ref 4.0–10.5)

## 2010-04-23 NOTE — H&P (Signed)
NAME:  KYNSLEI, ART               ACCOUNT NO.:  [id number]  MEDICAL RECORD NO.:  [id number]           PATIENT TYPE:  I  LOCATION:  5118                         FACILITY:  [facility]  PHYSICIAN:  Almond Lint, MD       DATE OF BIRTH:  [birth year]  DATE OF ADMISSION:  04/21/2010 DATE OF DISCHARGE:                             HISTORY & PHYSICAL   REQUESTING PHYSICIAN:  Charles B. Bernette Mayers, MD  CONSULTING PHYSICIAN:  Lawrence Marseilles. [name] Royalty, MD  CHIEF COMPLAINT:  Decreased ostomy output and abdominal pain.  HISTORY OF PRESENT ILLNESS:  Ms. Bultema is a 77 year old female who has a long complicated surgical history which I will detail later who was recently admitted for a small bowel obstruction last week.  She went home 2 days ago and then last night had a recurrent abdominal pain and nausea, vomiting.  She did not improve this morning despite taking only liquids and so she returns to the hospital.  She describes crampy abdominal pain and shortness of breath.  Shortness of breath has now improved.  She continues to have bloating, abdominal pain, nausea, vomiting.  She has had no output in her ostomy bag since last night.  When she went home on Friday, she had tolerated a regular diet x2 meals with reasonable ostomy output.  She has not had a diarrhea, constipation or recent illnesses. She denies chest pain and also of note denies other symptoms x15 systems.  PAST MEDICAL HISTORY:  is significant for coronary artery disease, hypertension, hyperthyroidism, colon cancer, gastroesophageal reflux disease, anxiety, bladder cancer, dyslipidemia, enterocutaneous fistula, bowel perforation, acute renal failure in the past and ventilator dependent respiratory failure.  FAMILY HISTORY:  Father died at age 67 with leukemia.  Mother at 49 with stroke and bladder cancer.  She has had also a brother with coronary artery disease.  PAST SURGICAL HISTORY:  Most recently she has had a left  hemicolectomy, right hemicolectomy complicated by a leak at an outside hospital.  She was taken back to our hospital for small bowel resection and revision of ileocolonic anastomosis with ileostomy.  She then had another episode of bowel perforation and had repair of enterotomy and open abdomen.  She was taken back for abdominal wound VAC changes and then closure of the abdomen.  Additionally, she has had vaginal hysterectomy and oophorectomy, TURBT, right shoulder surgery, bilateral carpal tunnel release, left hand surgery, recent right hand surgery, left total hip replacement, right breast biopsy in 2000, right knee arthroscopy in 1998, lap chole in 1993, vein stripping bilaterally in 1957, back surgery in 1966.  ALLERGIES:  CODEINE, DEMEROL, MORPHINE, PENTAZOCINE, NITROFURANTOIN, NOVOCAINE.  MEDICATIONS:  Acyclovir, Vicodin, Xanax, aspirin, digoxin, multivitamin, levothyroxine, folic acid, Diovan, Coreg and omeprazole.  PHYSICAL EXAMINATION:  VITAL SIGNS:  Temperature is 99.2, pulse 70, respiratory rate 20, blood pressure 132/51. GENERAL:  She is alert and oriented x3 and looks uncomfortable. HEENT:  Normocephalic, atraumatic.  Sclerae anicteric. NECK:  Supple. HEART:  Regular. LUNGS:  Clear. ABDOMEN:  Soft, distended, mild tenderness with palpation diffusely and no gas or succus in the ostomy bag. EXTREMITIES:  Warm and well  perfused without pain and edema.  She has a dressing on her right palm from recent surgery. SKIN:  No rashes were seen. NEUROLOGIC:  No gross motor sensory deficits.  LABORATORY FINDINGS:  White count 7.2, hematocrit 35.1, platelet count 207,000.  Sodium 137, potassium 4.4, chloride 110, CO2 20, BUN 31, creatinine 1.62, glucose 111, BUN 0.4, AST is 28, ALT 22, alk phos 93, albumin 3.7, lipase 41.  CT shows a small bowel obstruction with transition point on the anterior left pelvis with no evidence of free air, free fluid.  There is also a possible mass  in the left kidney.  IMPRESSION:  Ms. Wilmouth is a 77 year old female with recurrent small bowel obstruction, acute renal insufficiency and possible left kidney mass.  We will admit her to the floor for IV fluids and rehydration, bowel rest with n.p.o. and NG tube.  We will place her on TNA as she has had minimal enteral nutrition over the last week.  I will check her prealbumin to evaluate for protein calorie malnutrition.  Certainly, her albumin is acceptable.   Dr. [name] Royalty of Medicine consulted on her to assist with managing her chronic medical problems.  We will get a Hemoglobin A1c as her past records document diabetes, but she is on no diabetic meds and has normal glucose.  We will also get repeat abdominal films in the morning.     Almond Lint, MD     FB/MEDQ  D:  04/21/2010  T:  04/22/2010  Job:  [id number]  Electronically Signed by Almond Lint MD on 04/23/2010 09:36:17 AM

## 2010-04-24 LAB — URINALYSIS, MICROSCOPIC ONLY
Bilirubin Urine: NEGATIVE
Glucose, UA: NEGATIVE mg/dL
Hgb urine dipstick: NEGATIVE
Ketones, ur: NEGATIVE mg/dL
Protein, ur: NEGATIVE mg/dL

## 2010-04-26 NOTE — Discharge Summary (Signed)
NAME:  Karen [name], Karen [name]               ACCOUNT NO.:  [id number]  MEDICAL RECORD NO.:  [id number]           PATIENT TYPE:  I  LOCATION:  5508                         FACILITY:  [facility]  PHYSICIAN:  Currie Paris, M.D.DATE OF BIRTH:  10-10-1929  DATE OF ADMISSION:  04/15/2010 DATE OF DISCHARGE:  04/19/2010                              DISCHARGE SUMMARY   ADMITTING PHYSICIAN:  Adolph Pollack, MD  DISCHARGING PHYSICIAN:  Currie Paris, MD  PRIMARY SURGEON:  Adolph Pollack, MD  REASON FOR ADMISSION:  Ms. Karen [name] is a 77 year old female with a very complex abdominal surgical history who presented to [facility] with complaints of bloating and decreased ileostomy output along with some nausea.  After evaluation, she was found to have a small bowel obstruction, but the patient requested transfer down to [facility] for further evaluation and treatment.  Please see admitting history and physical for further details.  ADMITTING DIAGNOSES: 1. Small bowel obstruction. 2. Multiple other medical problems.  HOSPITAL COURSE:  At this time, the patient was admitted.  An NG tube was placed for decompression.  This was kept in for approximately 2-1/2 days.  Repeat abdominal films revealed improving and resolution of a small bowel obstruction.  Therefore, her NG tube was discontinued on the hospital day #2-1/2.  Her abdomen at this time was soft and nontender. She had approximately 200 mL of bilious liquid in her ostomy pouch. After NG tube was removed, her diet was advanced as tolerated.  By the hospital day #4, the patient was tolerating a regular diet and having good weekly bowel movement in her ileostomy pouch.  The patient was also found to have a urinary tract infection.  This was treated with Cipro and she will go home with about 5 days of Cipro.  The patient did complain prior to discharge of some mucous-type bowel movements which I assured her was normal.  She did  have some brown tinged to them.  During the colonoscopy that she received earlier this year, they did give her 3 enemas at that time with successful removal of some old contrast and stool.  I discussed with Dr. Jamey Ripa the possibility of giving her a very low-flow gentle enema prior to discharge, however, not knowing how long her rectal stump is, we will defer that as apparently the patient had relatively good results from her prior enemas during her colonoscopy.  DISCHARGE DIAGNOSES: 1. Small bowel obstruction, resolved. 2. Biliary tract infection. 3. Coronary artery disease. 4. Hypertension. 5. Hypothyroidism. 6. Type 2 diabetes mellitus. 7. History of colon cancer, status post multiple resections. 8. GERD. 9. Anxiety disorder. 10.Bladder cancer. 11.Dyslipidemia. 12.Acute renal failure. 13.History of ventilator-dependent respiratory failure.  DISCHARGE MEDICATIONS:  She may resume all normal home medications and I will add Cipro for UTI coverage.  DISCHARGE INSTRUCTIONS:  The patient has no activity restrictions.  She may resume her normal diet.  She may follow up with her primary care as needed.  She may follow up with Dr. Abbey Chatters as needed.     Karen Cape, PA   ______________________________ Currie Paris, M.D.  KEO/MEDQ  D:  04/19/2010  T:  04/19/2010  Job:  [id number]  cc:   Adolph Pollack, M.D.  Electronically Signed by Barnetta Chapel PA on 04/24/2010 01:34:37 PM Electronically Signed by Cyndia Bent M.D. on 04/26/2010 07:22:30 AM

## 2010-05-03 NOTE — Discharge Summary (Signed)
NAME:  Karen [name], Karen [name]               ACCOUNT NO.:  [id number]  MEDICAL RECORD NO.:  [id number]           PATIENT TYPE:  I  LOCATION:  5118                         FACILITY:  [facility]  PHYSICIAN:  Sandria Bales. Ezzard Standing, M.D.  DATE OF BIRTH:  03-12-1929  DATE OF ADMISSION:  04/21/2010 DATE OF DISCHARGE:                        DISCHARGE SUMMARY - REFERRING   ADMISSION DIAGNOSES: 1. Recurrent small bowel obstruction status post hospitalization with     same problem March 6 through April 22, 2010. 2. Status post right colectomy, left hemicolectomy; small bowel     obstruction with ileostomy; repair of an anastomotic leak;     abdominal wound closure. 3. Urinary tract infection on antibiotics from at least March 6     through March 12 hospitalization. 4. New renal lesion left kidney 7 mm, rule out carcinoma on CT this     admission. 5. History of colon cancer, bladder cancer with multiple     enterocutaneous fistulas. 6. History of supraventricular tachycardia and congestive heart     failure. 7. Hypertension. 8. History of acute renal failure. 9. History of ventilatory dependent respiratory failure. 10.She has a history of diabetes but says she has never been on     treatment. 11.Hypothyroid. 12.Gastroesophageal reflux disease. 13.The patient is on acyclovir, we were not sure of the etiology but     only a remote history of shingles.  DISCHARGE DIAGNOSES: 1. Recurrent small bowel obstruction status post hospitalization with     same problem March 6 through April 22, 2010. 2. Status post right colectomy, left hemicolectomy; small bowel     obstruction with ileostomy; repair of an anastomotic leak;     abdominal wound closure. 3. Urinary tract infection on antibiotics from at least March 6     through March 12 hospitalization. 4. New renal lesion left kidney 7 mm, rule out carcinoma on CT this     admission. 5. History of colon cancer, bladder cancer with multiple     enterocutaneous  fistulas. 6. History of supraventricular tachycardia and congestive heart     failure. 7. Hypertension. 8. History of acute renal failure. 9. History of ventilatory dependent respiratory failure. 10.She has a history of diabetes but says she has never been on     treatment. 11.Hypothyroid. 12.Gastroesophageal reflux disease. 13.The patient is on acyclovir, we were not sure of the etiology but     only a remote history of shingles.  PROCEDURES: 1. CT of the abdomen April 21, 2010.  This showed small bowel     obstruction with transition point at the anterior left pelvis     without discrete cause identified.  There is question of adhesions,     no evidence of pneumoperitoneum.  There is a 7 mm possible solid     mass within the left lower kidney, consider elective MRI of the     kidneys with and without contrast confirmation. 2. PICC placement April 23, 2010.  BRIEF HISTORY:  The patient is a 77 year old female with long complicated surgical history as detailed above.  She was recently hospitalized for small bowel obstruction, went home 2 days  prior to this admission.  She had recurrent abdominal pain and nausea and vomiting. This has not improved despite taking only liquids, so she returned to the hospital.  She describes a crampy abdominal feeling, shortness of breath which had subsequently improved.  The patient was readmitted at this time for further treatment and evaluation.  PAST MEDICAL HISTORY:  As detailed above.  ALLERGIES:  CODEINE, DEMEROL, MORPHINE, PENTAZOCINE, NITROFURANTOIN, and NOVOCAINE.  MEDICATIONS ON ADMISSION:  This is from the medication reconciliation order list that included Zofran 4 mg q.6 p.r.n., Nexium 40 mg daily, multivitamin 1 daily, levothyroxine 75 mcg daily, hydrocodone/APAP 5/325 one q.4 h. p.r.n., folic acid 1 mg daily, Diovan 160 mg daily, digoxin 0.125 mg daily, Cipro 500 mg b.i.d., Coreg 25 mg p.o. b.i.d., aspirin 325 mg daily, alprazolam  0.5 mg daily and acyclovir 800 mg one dose 5 times a day, Tylenol 325 mg p.r.n.  For further history and physical, please see the dictated note.  Labs on admission showed a white count of 7.2, hematocrit of 35, platelet count was normal.  Sodium is 137, potassium 4.4, chloride is 110, CO2 is 20, BUN is 31, creatinine slightly elevated at 1.62, glucose 111, BUN was 4.  LFTs were normal.  Lipase was 41.  HOSPITAL COURSE:  The patient was admitted, placed on IV fluids and a PICC line was actually placed and with some difficulty with tentative plans for TNA nutrition since this was a recurrence in such a short time.  By the time we saw her on the following day, her small bowel obstruction broke loose and abdomen was benign.  The ostomy had an output.  We removed her NG tube and started her on some clear liquids. On the next hospital day, April 23, 2010, her abdomen was soft.  She had bowel sounds.  She still had some epigastric discomfort.  Her ileostomy was putting out in fair amount and it was our opinion she was not doing better.   She was advanced to full liquids.  We were asked whether we want to put her on sliding scale with a history of diabetes.  The patient says she has never been treated for that, she is apparently a diet controlled diabetic but we had no documentation. Her sugars were all within normal limits in the hospital, so we chose to defer on the sliding scale.  She was seen by medicine and they recommended placing her back on her regular medicines and felt no further treatment was necessary from their standpoint.  As we reviewed her medicines, we noticed she was on large dose of Coreg and Diovan.  Ordered those medicines but ordered orthostatic blood pressures which showed she in deed have a orthostatic drop, she went from 157/70, supine down to 130/69.  She was asymptomatic but we discussed this with Dr. Neita Carp and agreed to cut her Coreg down to 12.5 b.i.d. and her  Diovan down to 80 mg daily and for Dr. Neita Carp to see her in about a week at which time he will readjust her medicines as needed.  We gave her a 10-day supply of this new doses in order to make sure she has what she needs.      She is rather anxious about going forward with a full diet but would encourage her advance slowly,  we plan to discharge her home this morning, April 25, 2010.  We have  discontinued the Diovan 160 daily, carvedilol 25 mg b.i.d., acyclovir 800 mg daily and Cipro  500 mg b.i.d.  Her UA yesterday showed only 3-6 white cells, no bacteria.  Current meds will be Coreg 25 mg half tablet p.o. b.i.d., Diovan 80 mg daily.  She will continue her aspirin 325 mg daily, Tylenol p.r.n., alprazolam 0.5 mg p.r.n. for sleep, digoxin 0.125 mg daily, folic acid 1 mg daily, hydrocodone/APAP p.r.n., levothyroxine 75 mcg daily, multivitamin 1 daily, Nexium 40 mg daily and Zofran 4 mg p.r.n.  FOLLOWUP:  She will call Dr. Dian Situ office and get an appointment to come back and see him in 1 week.  She will follow up with Dr. Abbey Chatters on May 08, 2010 at 11:15.  DISCHARGE ACTIVITY:  Light to moderate as instructed.  CONDITION ON DISCHARGE:  Improving.  Dr. Neita Carp has been made aware of the 7 mm lesion noted in the left kidney and he will follow this up as an outpatient.  The patient requested outpatient MRI that was necessary.   Eber Hong, P.A.  Sandria Bales. Ezzard Standing, M.D., FACS   WDJ/MEDQ  D:  04/25/2010  T:  04/25/2010  Job:  [id number]  cc:   Fara Chute  Electronically Signed by Sherrie George P.A. on 04/30/2010 04:53:22 PM Electronically Signed by Ovidio Kin M.D. on 05/03/2010 07:41:15 AM

## 2010-05-16 LAB — BRAIN NATRIURETIC PEPTIDE
Pro B Natriuretic peptide (BNP): 411 pg/mL — ABNORMAL HIGH (ref 0.0–100.0)
Pro B Natriuretic peptide (BNP): 446 pg/mL — ABNORMAL HIGH (ref 0.0–100.0)
Pro B Natriuretic peptide (BNP): 1158 pg/mL — ABNORMAL HIGH (ref 0.0–100.0)

## 2010-05-16 LAB — IRON AND TIBC
Iron: 18 ug/dL — ABNORMAL LOW (ref 42–135)
TIBC: 243 ug/dL — ABNORMAL LOW (ref 250–470)

## 2010-05-16 LAB — URINALYSIS, ROUTINE W REFLEX MICROSCOPIC
Bilirubin Urine: NEGATIVE
Glucose, UA: NEGATIVE mg/dL
Ketones, ur: NEGATIVE mg/dL
Nitrite: NEGATIVE
Specific Gravity, Urine: 1.022 (ref 1.005–1.030)
pH: 8 (ref 5.0–8.0)

## 2010-05-16 LAB — GLUCOSE, CAPILLARY
Glucose-Capillary: 103 mg/dL — ABNORMAL HIGH (ref 70–99)
Glucose-Capillary: 106 mg/dL — ABNORMAL HIGH (ref 70–99)
Glucose-Capillary: 109 mg/dL — ABNORMAL HIGH (ref 70–99)
Glucose-Capillary: 111 mg/dL — ABNORMAL HIGH (ref 70–99)
Glucose-Capillary: 112 mg/dL — ABNORMAL HIGH (ref 70–99)
Glucose-Capillary: 114 mg/dL — ABNORMAL HIGH (ref 70–99)
Glucose-Capillary: 115 mg/dL — ABNORMAL HIGH (ref 70–99)
Glucose-Capillary: 117 mg/dL — ABNORMAL HIGH (ref 70–99)
Glucose-Capillary: 118 mg/dL — ABNORMAL HIGH (ref 70–99)
Glucose-Capillary: 124 mg/dL — ABNORMAL HIGH (ref 70–99)
Glucose-Capillary: 130 mg/dL — ABNORMAL HIGH (ref 70–99)
Glucose-Capillary: 131 mg/dL — ABNORMAL HIGH (ref 70–99)
Glucose-Capillary: 139 mg/dL — ABNORMAL HIGH (ref 70–99)
Glucose-Capillary: 141 mg/dL — ABNORMAL HIGH (ref 70–99)
Glucose-Capillary: 144 mg/dL — ABNORMAL HIGH (ref 70–99)
Glucose-Capillary: 150 mg/dL — ABNORMAL HIGH (ref 70–99)
Glucose-Capillary: 158 mg/dL — ABNORMAL HIGH (ref 70–99)
Glucose-Capillary: 90 mg/dL (ref 70–99)
Glucose-Capillary: 92 mg/dL (ref 70–99)
Glucose-Capillary: 94 mg/dL (ref 70–99)
Glucose-Capillary: 95 mg/dL (ref 70–99)
Glucose-Capillary: 97 mg/dL (ref 70–99)
Glucose-Capillary: 99 mg/dL (ref 70–99)
Glucose-Capillary: 98 mg/dL (ref 70–99)
Glucose-Capillary: 99 mg/dL (ref 70–99)

## 2010-05-16 LAB — COMPREHENSIVE METABOLIC PANEL
ALT: 32 U/L (ref 0–35)
ALT: 51 U/L — ABNORMAL HIGH (ref 0–35)
ALT: 37 U/L — ABNORMAL HIGH (ref 0–35)
AST: 44 U/L — ABNORMAL HIGH (ref 0–37)
Albumin: 2.1 g/dL — ABNORMAL LOW (ref 3.5–5.2)
Albumin: 2.3 g/dL — ABNORMAL LOW (ref 3.5–5.2)
Albumin: 2.4 g/dL — ABNORMAL LOW (ref 3.5–5.2)
Alkaline Phosphatase: 129 U/L — ABNORMAL HIGH (ref 39–117)
Alkaline Phosphatase: 153 U/L — ABNORMAL HIGH (ref 39–117)
Alkaline Phosphatase: 135 U/L — ABNORMAL HIGH (ref 39–117)
BUN: 38 mg/dL — ABNORMAL HIGH (ref 6–23)
BUN: 43 mg/dL — ABNORMAL HIGH (ref 6–23)
BUN: 56 mg/dL — ABNORMAL HIGH (ref 6–23)
BUN: 59 mg/dL — ABNORMAL HIGH (ref 6–23)
BUN: 33 mg/dL — ABNORMAL HIGH (ref 6–23)
CO2: 30 mEq/L (ref 19–32)
CO2: 36 mEq/L — ABNORMAL HIGH (ref 19–32)
CO2: 26 mEq/L (ref 19–32)
Calcium: 8.8 mg/dL (ref 8.4–10.5)
Calcium: 8.8 mg/dL (ref 8.4–10.5)
Chloride: 105 mEq/L (ref 96–112)
Chloride: 99 mEq/L (ref 96–112)
Creatinine, Ser: 0.81 mg/dL (ref 0.4–1.2)
Creatinine, Ser: 0.85 mg/dL (ref 0.4–1.2)
Creatinine, Ser: 0.98 mg/dL (ref 0.4–1.2)
GFR calc Af Amer: >60 mL/min (ref >60)
GFR calc non Af Amer: 54 mL/min — ABNORMAL LOW (ref >60)
GFR calc non Af Amer: >60 mL/min (ref >60)
GFR calc non Af Amer: >60 mL/min (ref >60)
Glucose, Bld: 119 mg/dL — ABNORMAL HIGH (ref 70–99)
Glucose, Bld: 133 mg/dL — ABNORMAL HIGH (ref 70–99)
Glucose, Bld: 149 mg/dL — ABNORMAL HIGH (ref 70–99)
Glucose, Bld: 113 mg/dL — ABNORMAL HIGH (ref 70–99)
Potassium: 3.6 mEq/L (ref 3.5–5.1)
Potassium: 3.8 mEq/L (ref 3.5–5.1)
Potassium: 4.1 mEq/L (ref 3.5–5.1)
Potassium: 4.3 mEq/L (ref 3.5–5.1)
Potassium: 3.8 mEq/L (ref 3.5–5.1)
Sodium: 136 mEq/L (ref 135–145)
Sodium: 140 mEq/L (ref 135–145)
Sodium: 141 mEq/L (ref 135–145)
Sodium: 143 mEq/L (ref 135–145)
Total Bilirubin: 0.3 mg/dL (ref 0.3–1.2)
Total Bilirubin: 0.4 mg/dL (ref 0.3–1.2)
Total Protein: 6.4 g/dL (ref 6.0–8.3)
Total Protein: 7 g/dL (ref 6.0–8.3)
Total Protein: 7.2 g/dL (ref 6.0–8.3)

## 2010-05-16 LAB — BASIC METABOLIC PANEL
BUN: 42 mg/dL — ABNORMAL HIGH (ref 6–23)
BUN: 36 mg/dL — ABNORMAL HIGH (ref 6–23)
CO2: 32 mEq/L (ref 19–32)
Chloride: 101 mEq/L (ref 96–112)
Chloride: 105 mEq/L (ref 96–112)
Chloride: 108 mEq/L (ref 96–112)
Creatinine, Ser: 0.86 mg/dL (ref 0.4–1.2)
GFR calc Af Amer: >60 mL/min (ref >60)
GFR calc Af Amer: >60 mL/min (ref >60)
GFR calc non Af Amer: >60 mL/min (ref >60)
GFR calc non Af Amer: >60 mL/min (ref >60)
Potassium: 3.3 mEq/L — ABNORMAL LOW (ref 3.5–5.1)
Potassium: 4 mEq/L (ref 3.5–5.1)
Potassium: 3.9 mEq/L (ref 3.5–5.1)
Sodium: 139 mEq/L (ref 135–145)
Sodium: 141 mEq/L (ref 135–145)

## 2010-05-16 LAB — URINE MICROSCOPIC-ADD ON

## 2010-05-16 LAB — MAGNESIUM
Magnesium: 2.2 mg/dL (ref 1.5–2.5)
Magnesium: 2.2 mg/dL (ref 1.5–2.5)
Magnesium: 2.4 mg/dL (ref 1.5–2.5)

## 2010-05-16 LAB — HEMOGLOBIN A1C: Mean Plasma Glucose: 103 mg/dL

## 2010-05-16 LAB — CBC
HCT: 27.6 % — ABNORMAL LOW (ref 36.0–46.0)
HCT: 28 % — ABNORMAL LOW (ref 36.0–46.0)
HCT: 29.1 % — ABNORMAL LOW (ref 36.0–46.0)
HCT: 29.2 % — ABNORMAL LOW (ref 36.0–46.0)
HCT: 31.4 % — ABNORMAL LOW (ref 36.0–46.0)
Hemoglobin: 9.5 g/dL — ABNORMAL LOW (ref 12.0–15.0)
Hemoglobin: 10.5 g/dL — ABNORMAL LOW (ref 12.0–15.0)
MCHC: 33.1 g/dL (ref 30.0–36.0)
MCHC: 33.1 g/dL (ref 30.0–36.0)
MCHC: 33.7 g/dL (ref 30.0–36.0)
MCV: 87.7 fL (ref 78.0–100.0)
MCV: 87.9 fL (ref 78.0–100.0)
MCV: 88.8 fL (ref 78.0–100.0)
MCV: 89.4 fL (ref 78.0–100.0)
MCV: 90.1 fL (ref 78.0–100.0)
MCV: 89.4 fL (ref 78.0–100.0)
Platelets: 304 10*3/uL (ref 150–400)
Platelets: 318 10*3/uL (ref 150–400)
Platelets: 292 10*3/uL (ref 150–400)
RBC: 3.21 MIL/uL — ABNORMAL LOW (ref 3.87–5.11)
RBC: 3.22 MIL/uL — ABNORMAL LOW (ref 3.87–5.11)
RBC: 3.24 MIL/uL — ABNORMAL LOW (ref 3.87–5.11)
RBC: 3.28 MIL/uL — ABNORMAL LOW (ref 3.87–5.11)
RBC: 3.51 MIL/uL — ABNORMAL LOW (ref 3.87–5.11)
RDW: 15.2 % (ref 11.5–15.5)
RDW: 15.3 % (ref 11.5–15.5)
RDW: 15.7 % — ABNORMAL HIGH (ref 11.5–15.5)
WBC: 5.7 10*3/uL (ref 4.0–10.5)
WBC: 6.3 10*3/uL (ref 4.0–10.5)
WBC: 7.9 10*3/uL (ref 4.0–10.5)

## 2010-05-16 LAB — LIPID PANEL
Cholesterol: 97 mg/dL (ref 0–200)
Cholesterol: 98 mg/dL (ref 0–200)
HDL: 30 mg/dL — ABNORMAL LOW (ref >39)
LDL Cholesterol: 53 mg/dL (ref 0–99)
LDL Cholesterol: 55 mg/dL (ref 0–99)
Triglycerides: 67 mg/dL (ref <150)
Triglycerides: 68 mg/dL (ref <150)

## 2010-05-16 LAB — DIFFERENTIAL
Basophils Absolute: 0 10*3/uL (ref 0.0–0.1)
Basophils Absolute: 0 10*3/uL (ref 0.0–0.1)
Basophils Relative: 0 % (ref 0–1)
Eosinophils Absolute: 0.1 10*3/uL (ref 0.0–0.7)
Eosinophils Absolute: 0.1 10*3/uL (ref 0.0–0.7)
Eosinophils Absolute: 0 10*3/uL (ref 0.0–0.7)
Eosinophils Relative: 1 % (ref 0–5)
Eosinophils Relative: 0 % (ref 0–5)
Lymphocytes Relative: 27 % (ref 12–46)
Lymphocytes Relative: 18 % (ref 12–46)
Lymphs Abs: 1.5 10*3/uL (ref 0.7–4.0)
Lymphs Abs: 1.8 10*3/uL (ref 0.7–4.0)
Lymphs Abs: 1.4 10*3/uL (ref 0.7–4.0)
Monocytes Absolute: 0.6 10*3/uL (ref 0.1–1.0)
Monocytes Absolute: 0.8 10*3/uL (ref 0.1–1.0)
Monocytes Absolute: 0.7 10*3/uL (ref 0.1–1.0)
Monocytes Relative: 12 % (ref 3–12)
Monocytes Relative: 14 % — ABNORMAL HIGH (ref 3–12)
Monocytes Relative: 9 % (ref 3–12)
Neutro Abs: 2.6 10*3/uL (ref 1.7–7.7)
Neutro Abs: 3.2 10*3/uL (ref 1.7–7.7)
Neutrophils Relative %: 46 % (ref 43–77)

## 2010-05-16 LAB — PROTIME-INR
INR: 1.13 (ref 0.00–1.49)
INR: 1.24 (ref 0.00–1.49)
INR: 1.26 (ref 0.00–1.49)
INR: 1.49 (ref 0.00–1.49)
INR: 1.5 — ABNORMAL HIGH (ref 0.00–1.49)
INR: 1.58 — ABNORMAL HIGH (ref 0.00–1.49)
INR: 1.81 — ABNORMAL HIGH (ref 0.00–1.49)
INR: 2.08 — ABNORMAL HIGH (ref 0.00–1.49)
Prothrombin Time: 15.5 seconds — ABNORMAL HIGH (ref 11.6–15.2)
Prothrombin Time: 18 seconds — ABNORMAL HIGH (ref 11.6–15.2)
Prothrombin Time: 21.4 seconds — ABNORMAL HIGH (ref 11.6–15.2)

## 2010-05-16 LAB — CARDIAC PANEL(CRET KIN+CKTOT+MB+TROPI)
CK, MB: 1.6 ng/mL (ref 0.3–4.0)
CK, MB: 2.4 ng/mL (ref 0.3–4.0)
Relative Index: INVALID (ref 0.0–2.5)
Relative Index: INVALID (ref 0.0–2.5)
Total CK: 41 U/L (ref 7–177)
Troponin I: 0.09 ng/mL — ABNORMAL HIGH (ref 0.00–0.06)
Troponin I: 0.21 ng/mL — ABNORMAL HIGH (ref 0.00–0.06)

## 2010-05-16 LAB — TRIGLYCERIDES
Triglycerides: 83 mg/dL (ref <150)
Triglycerides: 83 mg/dL (ref <150)

## 2010-05-16 LAB — PREALBUMIN
Prealbumin: 19.5 mg/dL (ref 18.0–45.0)
Prealbumin: 24.4 mg/dL (ref 18.0–45.0)

## 2010-05-16 LAB — POCT CARDIAC MARKERS: Troponin i, poc: <0.05 ng/mL (ref 0.00–0.09)

## 2010-05-16 LAB — APTT: aPTT: 36 seconds (ref 24–37)

## 2010-05-16 LAB — CK TOTAL AND CKMB (NOT AT ARMC): Relative Index: INVALID (ref 0.0–2.5)

## 2010-05-16 LAB — PHOSPHORUS: Phosphorus: 3.8 mg/dL (ref 2.3–4.6)

## 2010-05-16 LAB — TROPONIN I: Troponin I: 0.23 ng/mL — ABNORMAL HIGH (ref 0.00–0.06)

## 2010-05-16 LAB — TSH: TSH: 2.025 u[IU]/mL (ref 0.350–4.500)

## 2010-05-17 LAB — COMPREHENSIVE METABOLIC PANEL
ALT: 36 U/L — ABNORMAL HIGH (ref 0–35)
ALT: 45 U/L — ABNORMAL HIGH (ref 0–35)
ALT: 25 U/L (ref 0–35)
ALT: 43 U/L — ABNORMAL HIGH (ref 0–35)
AST: 37 U/L (ref 0–37)
AST: 55 U/L — ABNORMAL HIGH (ref 0–37)
Albumin: 1.6 g/dL — ABNORMAL LOW (ref 3.5–5.2)
Albumin: 1.6 g/dL — ABNORMAL LOW (ref 3.5–5.2)
Alkaline Phosphatase: 223 U/L — ABNORMAL HIGH (ref 39–117)
Alkaline Phosphatase: 127 U/L — ABNORMAL HIGH (ref 39–117)
Alkaline Phosphatase: 189 U/L — ABNORMAL HIGH (ref 39–117)
BUN: 34 mg/dL — ABNORMAL HIGH (ref 6–23)
BUN: 28 mg/dL — ABNORMAL HIGH (ref 6–23)
CO2: 26 mEq/L (ref 19–32)
CO2: 29 mEq/L (ref 19–32)
CO2: 25 mEq/L (ref 19–32)
CO2: 28 mEq/L (ref 19–32)
CO2: 29 mEq/L (ref 19–32)
Calcium: 7.7 mg/dL — ABNORMAL LOW (ref 8.4–10.5)
Calcium: 8 mg/dL — ABNORMAL LOW (ref 8.4–10.5)
Calcium: 7.9 mg/dL — ABNORMAL LOW (ref 8.4–10.5)
Calcium: 7.9 mg/dL — ABNORMAL LOW (ref 8.4–10.5)
Calcium: 8 mg/dL — ABNORMAL LOW (ref 8.4–10.5)
Chloride: 105 mEq/L (ref 96–112)
Creatinine, Ser: 0.54 mg/dL (ref 0.4–1.2)
Creatinine, Ser: 0.5 mg/dL (ref 0.4–1.2)
Creatinine, Ser: 0.54 mg/dL (ref 0.4–1.2)
Creatinine, Ser: 0.59 mg/dL (ref 0.4–1.2)
GFR calc Af Amer: >60 mL/min (ref >60)
GFR calc Af Amer: >60 mL/min (ref >60)
GFR calc non Af Amer: >60 mL/min (ref >60)
GFR calc non Af Amer: >60 mL/min (ref >60)
GFR calc non Af Amer: >60 mL/min (ref >60)
GFR calc non Af Amer: >60 mL/min (ref >60)
GFR calc non Af Amer: >60 mL/min (ref >60)
GFR calc non Af Amer: >60 mL/min (ref >60)
Glucose, Bld: 101 mg/dL — ABNORMAL HIGH (ref 70–99)
Glucose, Bld: 127 mg/dL — ABNORMAL HIGH (ref 70–99)
Glucose, Bld: 111 mg/dL — ABNORMAL HIGH (ref 70–99)
Glucose, Bld: 129 mg/dL — ABNORMAL HIGH (ref 70–99)
Potassium: 4 mEq/L (ref 3.5–5.1)
Sodium: 131 mEq/L — ABNORMAL LOW (ref 135–145)
Sodium: 137 mEq/L (ref 135–145)
Sodium: 136 mEq/L (ref 135–145)
Total Bilirubin: 0.3 mg/dL (ref 0.3–1.2)
Total Protein: 5.6 g/dL — ABNORMAL LOW (ref 6.0–8.3)

## 2010-05-17 LAB — CBC
HCT: 24.1 % — ABNORMAL LOW (ref 36.0–46.0)
Hemoglobin: 8.1 g/dL — ABNORMAL LOW (ref 12.0–15.0)
MCHC: 32.9 g/dL (ref 30.0–36.0)
MCHC: 32.9 g/dL (ref 30.0–36.0)
MCHC: 33.3 g/dL (ref 30.0–36.0)
MCHC: 33.8 g/dL (ref 30.0–36.0)
MCV: 91.2 fL (ref 78.0–100.0)
MCV: 90.6 fL (ref 78.0–100.0)
MCV: 91.6 fL (ref 78.0–100.0)
Platelets: 223 10*3/uL (ref 150–400)
Platelets: 237 10*3/uL (ref 150–400)
Platelets: 285 10*3/uL (ref 150–400)
RBC: 2.69 MIL/uL — ABNORMAL LOW (ref 3.87–5.11)
RBC: 2.66 MIL/uL — ABNORMAL LOW (ref 3.87–5.11)
RBC: 2.76 MIL/uL — ABNORMAL LOW (ref 3.87–5.11)
RDW: 19.1 % — ABNORMAL HIGH (ref 11.5–15.5)
RDW: 20.1 % — ABNORMAL HIGH (ref 11.5–15.5)
RDW: 20.2 % — ABNORMAL HIGH (ref 11.5–15.5)
WBC: 5.3 10*3/uL (ref 4.0–10.5)

## 2010-05-17 LAB — URINALYSIS, ROUTINE W REFLEX MICROSCOPIC
Hgb urine dipstick: NEGATIVE
Ketones, ur: NEGATIVE mg/dL
Protein, ur: 30 mg/dL — AB
Urobilinogen, UA: 0.2 mg/dL (ref 0.0–1.0)

## 2010-05-17 LAB — GLUCOSE, CAPILLARY
Glucose-Capillary: 102 mg/dL — ABNORMAL HIGH (ref 70–99)
Glucose-Capillary: 102 mg/dL — ABNORMAL HIGH (ref 70–99)
Glucose-Capillary: 105 mg/dL — ABNORMAL HIGH (ref 70–99)
Glucose-Capillary: 107 mg/dL — ABNORMAL HIGH (ref 70–99)
Glucose-Capillary: 108 mg/dL — ABNORMAL HIGH (ref 70–99)
Glucose-Capillary: 112 mg/dL — ABNORMAL HIGH (ref 70–99)
Glucose-Capillary: 130 mg/dL — ABNORMAL HIGH (ref 70–99)
Glucose-Capillary: 153 mg/dL — ABNORMAL HIGH (ref 70–99)
Glucose-Capillary: 167 mg/dL — ABNORMAL HIGH (ref 70–99)
Glucose-Capillary: 111 mg/dL — ABNORMAL HIGH (ref 70–99)
Glucose-Capillary: 112 mg/dL — ABNORMAL HIGH (ref 70–99)
Glucose-Capillary: 115 mg/dL — ABNORMAL HIGH (ref 70–99)
Glucose-Capillary: 119 mg/dL — ABNORMAL HIGH (ref 70–99)
Glucose-Capillary: 127 mg/dL — ABNORMAL HIGH (ref 70–99)
Glucose-Capillary: 128 mg/dL — ABNORMAL HIGH (ref 70–99)
Glucose-Capillary: 135 mg/dL — ABNORMAL HIGH (ref 70–99)
Glucose-Capillary: 137 mg/dL — ABNORMAL HIGH (ref 70–99)
Glucose-Capillary: 137 mg/dL — ABNORMAL HIGH (ref 70–99)
Glucose-Capillary: 140 mg/dL — ABNORMAL HIGH (ref 70–99)
Glucose-Capillary: 141 mg/dL — ABNORMAL HIGH (ref 70–99)
Glucose-Capillary: 142 mg/dL — ABNORMAL HIGH (ref 70–99)
Glucose-Capillary: 143 mg/dL — ABNORMAL HIGH (ref 70–99)
Glucose-Capillary: 143 mg/dL — ABNORMAL HIGH (ref 70–99)
Glucose-Capillary: 144 mg/dL — ABNORMAL HIGH (ref 70–99)
Glucose-Capillary: 148 mg/dL — ABNORMAL HIGH (ref 70–99)
Glucose-Capillary: 148 mg/dL — ABNORMAL HIGH (ref 70–99)
Glucose-Capillary: 151 mg/dL — ABNORMAL HIGH (ref 70–99)
Glucose-Capillary: 152 mg/dL — ABNORMAL HIGH (ref 70–99)
Glucose-Capillary: 152 mg/dL — ABNORMAL HIGH (ref 70–99)
Glucose-Capillary: 153 mg/dL — ABNORMAL HIGH (ref 70–99)
Glucose-Capillary: 162 mg/dL — ABNORMAL HIGH (ref 70–99)
Glucose-Capillary: 95 mg/dL (ref 70–99)
Glucose-Capillary: 95 mg/dL (ref 70–99)
Glucose-Capillary: 95 mg/dL (ref 70–99)
Glucose-Capillary: 97 mg/dL (ref 70–99)

## 2010-05-17 LAB — IRON AND TIBC
Iron: 41 ug/dL — ABNORMAL LOW (ref 42–135)
UIBC: 105 ug/dL

## 2010-05-17 LAB — PHOSPHORUS
Phosphorus: 3.2 mg/dL (ref 2.3–4.6)
Phosphorus: 3.2 mg/dL (ref 2.3–4.6)
Phosphorus: 3.3 mg/dL (ref 2.3–4.6)
Phosphorus: 3.5 mg/dL (ref 2.3–4.6)

## 2010-05-17 LAB — DIFFERENTIAL
Basophils Absolute: 0 10*3/uL (ref 0.0–0.1)
Basophils Relative: 0 % (ref 0–1)
Eosinophils Absolute: 0 10*3/uL (ref 0.0–0.7)
Eosinophils Absolute: 0 10*3/uL (ref 0.0–0.7)
Eosinophils Relative: 0 % (ref 0–5)
Lymphocytes Relative: 23 % (ref 12–46)
Lymphs Abs: 1.6 10*3/uL (ref 0.7–4.0)
Monocytes Absolute: 0.6 10*3/uL (ref 0.1–1.0)
Monocytes Relative: 11 % (ref 3–12)
Neutro Abs: 4.5 10*3/uL (ref 1.7–7.7)
Neutrophils Relative %: 65 % (ref 43–77)

## 2010-05-17 LAB — BASIC METABOLIC PANEL
BUN: 32 mg/dL — ABNORMAL HIGH (ref 6–23)
BUN: 36 mg/dL — ABNORMAL HIGH (ref 6–23)
CO2: 29 mEq/L (ref 19–32)
CO2: 29 mEq/L (ref 19–32)
CO2: 30 mEq/L (ref 19–32)
CO2: 28 mEq/L (ref 19–32)
CO2: 30 mEq/L (ref 19–32)
Calcium: 8.3 mg/dL — ABNORMAL LOW (ref 8.4–10.5)
Calcium: 7.9 mg/dL — ABNORMAL LOW (ref 8.4–10.5)
Calcium: 8 mg/dL — ABNORMAL LOW (ref 8.4–10.5)
Calcium: 8 mg/dL — ABNORMAL LOW (ref 8.4–10.5)
Chloride: 102 mEq/L (ref 96–112)
Chloride: 104 mEq/L (ref 96–112)
Chloride: 99 mEq/L (ref 96–112)
Creatinine, Ser: 0.53 mg/dL (ref 0.4–1.2)
Creatinine, Ser: 0.55 mg/dL (ref 0.4–1.2)
GFR calc Af Amer: >60 mL/min (ref >60)
GFR calc Af Amer: >60 mL/min (ref >60)
GFR calc Af Amer: >60 mL/min (ref >60)
GFR calc Af Amer: >60 mL/min (ref >60)
GFR calc Af Amer: >60 mL/min (ref >60)
GFR calc non Af Amer: >60 mL/min (ref >60)
GFR calc non Af Amer: >60 mL/min (ref >60)
GFR calc non Af Amer: >60 mL/min (ref >60)
Glucose, Bld: 102 mg/dL — ABNORMAL HIGH (ref 70–99)
Potassium: 3.6 mEq/L (ref 3.5–5.1)
Potassium: 4.2 mEq/L (ref 3.5–5.1)
Potassium: 4.4 mEq/L (ref 3.5–5.1)
Sodium: 137 mEq/L (ref 135–145)
Sodium: 140 mEq/L (ref 135–145)
Sodium: 131 mEq/L — ABNORMAL LOW (ref 135–145)

## 2010-05-17 LAB — URINE CULTURE
Colony Count: >=100000
Special Requests: NEGATIVE

## 2010-05-17 LAB — TRIGLYCERIDES: Triglycerides: 93 mg/dL (ref <150)

## 2010-05-17 LAB — PREALBUMIN
Prealbumin: 11.9 mg/dL — ABNORMAL LOW (ref 18.0–45.0)
Prealbumin: 11 mg/dL — ABNORMAL LOW (ref 18.0–45.0)

## 2010-05-17 LAB — MAGNESIUM
Magnesium: 2 mg/dL (ref 1.5–2.5)
Magnesium: 2 mg/dL (ref 1.5–2.5)
Magnesium: 1.9 mg/dL (ref 1.5–2.5)
Magnesium: 1.9 mg/dL (ref 1.5–2.5)

## 2010-05-17 LAB — URINE MICROSCOPIC-ADD ON

## 2010-05-17 LAB — VITAMIN B12: Vitamin B-12: 478 pg/mL (ref 211–911)

## 2010-05-18 LAB — CBC
HCT: 24.4 % — ABNORMAL LOW (ref 36.0–46.0)
HCT: 24.6 % — ABNORMAL LOW (ref 36.0–46.0)
HCT: 25.6 % — ABNORMAL LOW (ref 36.0–46.0)
HCT: 26.9 % — ABNORMAL LOW (ref 36.0–46.0)
HCT: 28.1 % — ABNORMAL LOW (ref 36.0–46.0)
HCT: 31.5 % — ABNORMAL LOW (ref 36.0–46.0)
Hemoglobin: 10.2 g/dL — ABNORMAL LOW (ref 12.0–15.0)
Hemoglobin: 8.5 g/dL — ABNORMAL LOW (ref 12.0–15.0)
Hemoglobin: 8.6 g/dL — ABNORMAL LOW (ref 12.0–15.0)
Hemoglobin: 9.2 g/dL — ABNORMAL LOW (ref 12.0–15.0)
Hemoglobin: 9.4 g/dL — ABNORMAL LOW (ref 12.0–15.0)
MCHC: 33.5 g/dL (ref 30.0–36.0)
MCHC: 34.2 g/dL (ref 30.0–36.0)
MCHC: 34.3 g/dL (ref 30.0–36.0)
MCHC: 34.5 g/dL (ref 30.0–36.0)
MCHC: 34.6 g/dL (ref 30.0–36.0)
MCV: 86.2 fL (ref 78.0–100.0)
MCV: 86.4 fL (ref 78.0–100.0)
MCV: 87.9 fL (ref 78.0–100.0)
MCV: 89.2 fL (ref 78.0–100.0)
MCV: 89.2 fL (ref 78.0–100.0)
MCV: 89.3 fL (ref 78.0–100.0)
Platelets: 167 10*3/uL (ref 150–400)
Platelets: 199 10*3/uL (ref 150–400)
Platelets: 357 10*3/uL (ref 150–400)
RBC: 2.76 MIL/uL — ABNORMAL LOW (ref 3.87–5.11)
RBC: 2.87 MIL/uL — ABNORMAL LOW (ref 3.87–5.11)
RBC: 3.15 MIL/uL — ABNORMAL LOW (ref 3.87–5.11)
RBC: 3.32 MIL/uL — ABNORMAL LOW (ref 3.87–5.11)
RBC: 3.39 MIL/uL — ABNORMAL LOW (ref 3.87–5.11)
RBC: 3.58 MIL/uL — ABNORMAL LOW (ref 3.87–5.11)
RDW: 16.6 % — ABNORMAL HIGH (ref 11.5–15.5)
RDW: 17 % — ABNORMAL HIGH (ref 11.5–15.5)
RDW: 17.1 % — ABNORMAL HIGH (ref 11.5–15.5)
RDW: 19.2 % — ABNORMAL HIGH (ref 11.5–15.5)
WBC: 10.1 10*3/uL (ref 4.0–10.5)
WBC: 12.8 10*3/uL — ABNORMAL HIGH (ref 4.0–10.5)
WBC: 18.7 10*3/uL — ABNORMAL HIGH (ref 4.0–10.5)
WBC: 4.5 10*3/uL (ref 4.0–10.5)
WBC: 5.9 10*3/uL (ref 4.0–10.5)
WBC: 6.8 10*3/uL (ref 4.0–10.5)

## 2010-05-18 LAB — GLUCOSE, CAPILLARY
Glucose-Capillary: 105 mg/dL — ABNORMAL HIGH (ref 70–99)
Glucose-Capillary: 106 mg/dL — ABNORMAL HIGH (ref 70–99)
Glucose-Capillary: 113 mg/dL — ABNORMAL HIGH (ref 70–99)
Glucose-Capillary: 116 mg/dL — ABNORMAL HIGH (ref 70–99)
Glucose-Capillary: 116 mg/dL — ABNORMAL HIGH (ref 70–99)
Glucose-Capillary: 120 mg/dL — ABNORMAL HIGH (ref 70–99)
Glucose-Capillary: 120 mg/dL — ABNORMAL HIGH (ref 70–99)
Glucose-Capillary: 120 mg/dL — ABNORMAL HIGH (ref 70–99)
Glucose-Capillary: 120 mg/dL — ABNORMAL HIGH (ref 70–99)
Glucose-Capillary: 121 mg/dL — ABNORMAL HIGH (ref 70–99)
Glucose-Capillary: 121 mg/dL — ABNORMAL HIGH (ref 70–99)
Glucose-Capillary: 124 mg/dL — ABNORMAL HIGH (ref 70–99)
Glucose-Capillary: 125 mg/dL — ABNORMAL HIGH (ref 70–99)
Glucose-Capillary: 127 mg/dL — ABNORMAL HIGH (ref 70–99)
Glucose-Capillary: 128 mg/dL — ABNORMAL HIGH (ref 70–99)
Glucose-Capillary: 129 mg/dL — ABNORMAL HIGH (ref 70–99)
Glucose-Capillary: 129 mg/dL — ABNORMAL HIGH (ref 70–99)
Glucose-Capillary: 129 mg/dL — ABNORMAL HIGH (ref 70–99)
Glucose-Capillary: 130 mg/dL — ABNORMAL HIGH (ref 70–99)
Glucose-Capillary: 131 mg/dL — ABNORMAL HIGH (ref 70–99)
Glucose-Capillary: 132 mg/dL — ABNORMAL HIGH (ref 70–99)
Glucose-Capillary: 132 mg/dL — ABNORMAL HIGH (ref 70–99)
Glucose-Capillary: 133 mg/dL — ABNORMAL HIGH (ref 70–99)
Glucose-Capillary: 133 mg/dL — ABNORMAL HIGH (ref 70–99)
Glucose-Capillary: 133 mg/dL — ABNORMAL HIGH (ref 70–99)
Glucose-Capillary: 134 mg/dL — ABNORMAL HIGH (ref 70–99)
Glucose-Capillary: 135 mg/dL — ABNORMAL HIGH (ref 70–99)
Glucose-Capillary: 136 mg/dL — ABNORMAL HIGH (ref 70–99)
Glucose-Capillary: 137 mg/dL — ABNORMAL HIGH (ref 70–99)
Glucose-Capillary: 138 mg/dL — ABNORMAL HIGH (ref 70–99)
Glucose-Capillary: 138 mg/dL — ABNORMAL HIGH (ref 70–99)
Glucose-Capillary: 139 mg/dL — ABNORMAL HIGH (ref 70–99)
Glucose-Capillary: 140 mg/dL — ABNORMAL HIGH (ref 70–99)
Glucose-Capillary: 140 mg/dL — ABNORMAL HIGH (ref 70–99)
Glucose-Capillary: 140 mg/dL — ABNORMAL HIGH (ref 70–99)
Glucose-Capillary: 141 mg/dL — ABNORMAL HIGH (ref 70–99)
Glucose-Capillary: 142 mg/dL — ABNORMAL HIGH (ref 70–99)
Glucose-Capillary: 142 mg/dL — ABNORMAL HIGH (ref 70–99)
Glucose-Capillary: 145 mg/dL — ABNORMAL HIGH (ref 70–99)
Glucose-Capillary: 148 mg/dL — ABNORMAL HIGH (ref 70–99)
Glucose-Capillary: 148 mg/dL — ABNORMAL HIGH (ref 70–99)
Glucose-Capillary: 148 mg/dL — ABNORMAL HIGH (ref 70–99)
Glucose-Capillary: 149 mg/dL — ABNORMAL HIGH (ref 70–99)
Glucose-Capillary: 150 mg/dL — ABNORMAL HIGH (ref 70–99)
Glucose-Capillary: 151 mg/dL — ABNORMAL HIGH (ref 70–99)
Glucose-Capillary: 152 mg/dL — ABNORMAL HIGH (ref 70–99)
Glucose-Capillary: 154 mg/dL — ABNORMAL HIGH (ref 70–99)
Glucose-Capillary: 155 mg/dL — ABNORMAL HIGH (ref 70–99)
Glucose-Capillary: 156 mg/dL — ABNORMAL HIGH (ref 70–99)
Glucose-Capillary: 159 mg/dL — ABNORMAL HIGH (ref 70–99)
Glucose-Capillary: 162 mg/dL — ABNORMAL HIGH (ref 70–99)
Glucose-Capillary: 165 mg/dL — ABNORMAL HIGH (ref 70–99)
Glucose-Capillary: 166 mg/dL — ABNORMAL HIGH (ref 70–99)
Glucose-Capillary: 168 mg/dL — ABNORMAL HIGH (ref 70–99)
Glucose-Capillary: 169 mg/dL — ABNORMAL HIGH (ref 70–99)
Glucose-Capillary: 176 mg/dL — ABNORMAL HIGH (ref 70–99)
Glucose-Capillary: 185 mg/dL — ABNORMAL HIGH (ref 70–99)
Glucose-Capillary: 185 mg/dL — ABNORMAL HIGH (ref 70–99)
Glucose-Capillary: 208 mg/dL — ABNORMAL HIGH (ref 70–99)
Glucose-Capillary: 84 mg/dL (ref 70–99)
Glucose-Capillary: 86 mg/dL (ref 70–99)
Glucose-Capillary: 88 mg/dL (ref 70–99)
Glucose-Capillary: 94 mg/dL (ref 70–99)

## 2010-05-18 LAB — CHOLESTEROL, TOTAL
Cholesterol: 109 mg/dL (ref 0–200)
Cholesterol: 94 mg/dL (ref 0–200)

## 2010-05-18 LAB — BLOOD GAS, ARTERIAL
Acid-base deficit: 3.8 mmol/L — ABNORMAL HIGH (ref 0.0–2.0)
Bicarbonate: 19.7 mEq/L — ABNORMAL LOW (ref 20.0–24.0)
O2 Saturation: 93.5 %
pO2, Arterial: 65.4 mmHg — ABNORMAL LOW (ref 80.0–100.0)

## 2010-05-18 LAB — BASIC METABOLIC PANEL
BUN: 15 mg/dL (ref 6–23)
BUN: 39 mg/dL — ABNORMAL HIGH (ref 6–23)
BUN: 44 mg/dL — ABNORMAL HIGH (ref 6–23)
BUN: 45 mg/dL — ABNORMAL HIGH (ref 6–23)
BUN: 46 mg/dL — ABNORMAL HIGH (ref 6–23)
BUN: 46 mg/dL — ABNORMAL HIGH (ref 6–23)
BUN: 48 mg/dL — ABNORMAL HIGH (ref 6–23)
CO2: 21 mEq/L (ref 19–32)
CO2: 21 mEq/L (ref 19–32)
CO2: 22 mEq/L (ref 19–32)
Calcium: 7.1 mg/dL — ABNORMAL LOW (ref 8.4–10.5)
Calcium: 7.6 mg/dL — ABNORMAL LOW (ref 8.4–10.5)
Calcium: 7.7 mg/dL — ABNORMAL LOW (ref 8.4–10.5)
Calcium: 7.7 mg/dL — ABNORMAL LOW (ref 8.4–10.5)
Calcium: 7.7 mg/dL — ABNORMAL LOW (ref 8.4–10.5)
Calcium: 7.9 mg/dL — ABNORMAL LOW (ref 8.4–10.5)
Calcium: 8 mg/dL — ABNORMAL LOW (ref 8.4–10.5)
Calcium: 8.1 mg/dL — ABNORMAL LOW (ref 8.4–10.5)
Calcium: 8.2 mg/dL — ABNORMAL LOW (ref 8.4–10.5)
Chloride: 102 mEq/L (ref 96–112)
Chloride: 105 mEq/L (ref 96–112)
Chloride: 106 mEq/L (ref 96–112)
Chloride: 107 mEq/L (ref 96–112)
Chloride: 108 mEq/L (ref 96–112)
Creatinine, Ser: 0.73 mg/dL (ref 0.4–1.2)
Creatinine, Ser: 0.8 mg/dL (ref 0.4–1.2)
Creatinine, Ser: 0.83 mg/dL (ref 0.4–1.2)
Creatinine, Ser: 0.91 mg/dL (ref 0.4–1.2)
Creatinine, Ser: 1.16 mg/dL (ref 0.4–1.2)
Creatinine, Ser: 1.2 mg/dL (ref 0.4–1.2)
GFR calc Af Amer: 52 mL/min — ABNORMAL LOW (ref >60)
GFR calc Af Amer: 55 mL/min — ABNORMAL LOW (ref >60)
GFR calc Af Amer: >60 mL/min (ref >60)
GFR calc Af Amer: >60 mL/min (ref >60)
GFR calc Af Amer: >60 mL/min (ref >60)
GFR calc Af Amer: >60 mL/min (ref >60)
GFR calc non Af Amer: 43 mL/min — ABNORMAL LOW (ref >60)
GFR calc non Af Amer: 45 mL/min — ABNORMAL LOW (ref >60)
GFR calc non Af Amer: 49 mL/min — ABNORMAL LOW (ref >60)
GFR calc non Af Amer: >60 mL/min (ref >60)
GFR calc non Af Amer: >60 mL/min (ref >60)
GFR calc non Af Amer: >60 mL/min (ref >60)
GFR calc non Af Amer: >60 mL/min (ref >60)
GFR calc non Af Amer: >60 mL/min (ref >60)
GFR calc non Af Amer: >60 mL/min (ref >60)
GFR calc non Af Amer: >60 mL/min (ref >60)
GFR calc non Af Amer: >60 mL/min (ref >60)
Glucose, Bld: 122 mg/dL — ABNORMAL HIGH (ref 70–99)
Glucose, Bld: 128 mg/dL — ABNORMAL HIGH (ref 70–99)
Glucose, Bld: 138 mg/dL — ABNORMAL HIGH (ref 70–99)
Glucose, Bld: 140 mg/dL — ABNORMAL HIGH (ref 70–99)
Glucose, Bld: 140 mg/dL — ABNORMAL HIGH (ref 70–99)
Glucose, Bld: 155 mg/dL — ABNORMAL HIGH (ref 70–99)
Glucose, Bld: 158 mg/dL — ABNORMAL HIGH (ref 70–99)
Potassium: 3.8 mEq/L (ref 3.5–5.1)
Potassium: 3.8 mEq/L (ref 3.5–5.1)
Potassium: 4 mEq/L (ref 3.5–5.1)
Potassium: 4.7 mEq/L (ref 3.5–5.1)
Potassium: 4.9 mEq/L (ref 3.5–5.1)
Potassium: 7.1 mEq/L (ref 3.5–5.1)
Sodium: 128 mEq/L — ABNORMAL LOW (ref 135–145)
Sodium: 134 mEq/L — ABNORMAL LOW (ref 135–145)
Sodium: 135 mEq/L (ref 135–145)
Sodium: 138 mEq/L (ref 135–145)
Sodium: 141 mEq/L (ref 135–145)
Sodium: 150 mEq/L — ABNORMAL HIGH (ref 135–145)

## 2010-05-18 LAB — POCT I-STAT 3, ART BLOOD GAS (G3+)
Acid-Base Excess: 4 mmol/L — ABNORMAL HIGH (ref 0.0–2.0)
Bicarbonate: 23.9 mEq/L (ref 20.0–24.0)
O2 Saturation: 97 %
Patient temperature: 98.3
pH, Arterial: 7.355 (ref 7.350–7.400)

## 2010-05-18 LAB — CULTURE, BLOOD (ROUTINE X 2): Culture: NO GROWTH

## 2010-05-18 LAB — MAGNESIUM
Magnesium: 1.9 mg/dL (ref 1.5–2.5)
Magnesium: 1.9 mg/dL (ref 1.5–2.5)
Magnesium: 2 mg/dL (ref 1.5–2.5)
Magnesium: 2.1 mg/dL (ref 1.5–2.5)

## 2010-05-18 LAB — URINE CULTURE
Colony Count: NO GROWTH
Colony Count: NO GROWTH
Culture: NO GROWTH
Culture: NO GROWTH
Special Requests: NEGATIVE

## 2010-05-18 LAB — COMPREHENSIVE METABOLIC PANEL
ALT: 23 U/L (ref 0–35)
ALT: 30 U/L (ref 0–35)
ALT: 30 U/L (ref 0–35)
ALT: 42 U/L — ABNORMAL HIGH (ref 0–35)
ALT: 56 U/L — ABNORMAL HIGH (ref 0–35)
AST: 28 U/L (ref 0–37)
AST: 29 U/L (ref 0–37)
AST: 31 U/L (ref 0–37)
AST: 53 U/L — ABNORMAL HIGH (ref 0–37)
Albumin: 1.4 g/dL — ABNORMAL LOW (ref 3.5–5.2)
Albumin: 1.5 g/dL — ABNORMAL LOW (ref 3.5–5.2)
Albumin: 1.6 g/dL — ABNORMAL LOW (ref 3.5–5.2)
Alkaline Phosphatase: 107 U/L (ref 39–117)
Alkaline Phosphatase: 116 U/L (ref 39–117)
Alkaline Phosphatase: 117 U/L (ref 39–117)
Alkaline Phosphatase: 131 U/L — ABNORMAL HIGH (ref 39–117)
Alkaline Phosphatase: 146 U/L — ABNORMAL HIGH (ref 39–117)
Alkaline Phosphatase: 153 U/L — ABNORMAL HIGH (ref 39–117)
Alkaline Phosphatase: 158 U/L — ABNORMAL HIGH (ref 39–117)
Alkaline Phosphatase: 171 U/L — ABNORMAL HIGH (ref 39–117)
BUN: 33 mg/dL — ABNORMAL HIGH (ref 6–23)
BUN: 36 mg/dL — ABNORMAL HIGH (ref 6–23)
BUN: 37 mg/dL — ABNORMAL HIGH (ref 6–23)
BUN: 39 mg/dL — ABNORMAL HIGH (ref 6–23)
BUN: 54 mg/dL — ABNORMAL HIGH (ref 6–23)
CO2: 22 mEq/L (ref 19–32)
CO2: 24 mEq/L (ref 19–32)
CO2: 24 mEq/L (ref 19–32)
CO2: 24 mEq/L (ref 19–32)
CO2: 25 mEq/L (ref 19–32)
CO2: 30 mEq/L (ref 19–32)
Calcium: 7.6 mg/dL — ABNORMAL LOW (ref 8.4–10.5)
Calcium: 8 mg/dL — ABNORMAL LOW (ref 8.4–10.5)
Chloride: 106 mEq/L (ref 96–112)
Chloride: 113 mEq/L — ABNORMAL HIGH (ref 96–112)
Chloride: 117 mEq/L — ABNORMAL HIGH (ref 96–112)
Chloride: 119 mEq/L — ABNORMAL HIGH (ref 96–112)
Chloride: 99 mEq/L (ref 96–112)
Creatinine, Ser: 0.59 mg/dL (ref 0.4–1.2)
Creatinine, Ser: 0.59 mg/dL (ref 0.4–1.2)
Creatinine, Ser: 0.8 mg/dL (ref 0.4–1.2)
Creatinine, Ser: 1.01 mg/dL (ref 0.4–1.2)
GFR calc Af Amer: >60 mL/min (ref >60)
GFR calc Af Amer: >60 mL/min (ref >60)
GFR calc Af Amer: >60 mL/min (ref >60)
GFR calc Af Amer: >60 mL/min (ref >60)
GFR calc Af Amer: >60 mL/min (ref >60)
GFR calc non Af Amer: 53 mL/min — ABNORMAL LOW (ref >60)
GFR calc non Af Amer: >60 mL/min (ref >60)
GFR calc non Af Amer: >60 mL/min (ref >60)
GFR calc non Af Amer: >60 mL/min (ref >60)
Glucose, Bld: 119 mg/dL — ABNORMAL HIGH (ref 70–99)
Glucose, Bld: 131 mg/dL — ABNORMAL HIGH (ref 70–99)
Glucose, Bld: 136 mg/dL — ABNORMAL HIGH (ref 70–99)
Glucose, Bld: 144 mg/dL — ABNORMAL HIGH (ref 70–99)
Glucose, Bld: 152 mg/dL — ABNORMAL HIGH (ref 70–99)
Potassium: 3.4 mEq/L — ABNORMAL LOW (ref 3.5–5.1)
Potassium: 3.7 mEq/L (ref 3.5–5.1)
Potassium: 4.1 mEq/L (ref 3.5–5.1)
Potassium: 4.3 mEq/L (ref 3.5–5.1)
Potassium: 4.3 mEq/L (ref 3.5–5.1)
Potassium: 4.4 mEq/L (ref 3.5–5.1)
Sodium: 130 mEq/L — ABNORMAL LOW (ref 135–145)
Sodium: 131 mEq/L — ABNORMAL LOW (ref 135–145)
Sodium: 139 mEq/L (ref 135–145)
Sodium: 141 mEq/L (ref 135–145)
Sodium: 147 mEq/L — ABNORMAL HIGH (ref 135–145)
Sodium: 147 mEq/L — ABNORMAL HIGH (ref 135–145)
Total Bilirubin: 0.6 mg/dL (ref 0.3–1.2)
Total Bilirubin: 0.7 mg/dL (ref 0.3–1.2)
Total Bilirubin: 0.7 mg/dL (ref 0.3–1.2)
Total Bilirubin: 0.8 mg/dL (ref 0.3–1.2)
Total Bilirubin: 1.2 mg/dL (ref 0.3–1.2)
Total Protein: 4.9 g/dL — ABNORMAL LOW (ref 6.0–8.3)
Total Protein: 5.2 g/dL — ABNORMAL LOW (ref 6.0–8.3)
Total Protein: 5.3 g/dL — ABNORMAL LOW (ref 6.0–8.3)
Total Protein: 5.4 g/dL — ABNORMAL LOW (ref 6.0–8.3)
Total Protein: 5.6 g/dL — ABNORMAL LOW (ref 6.0–8.3)

## 2010-05-18 LAB — DIFFERENTIAL
Basophils Relative: 0 % (ref 0–1)
Eosinophils Relative: 1 % (ref 0–5)
Lymphs Abs: 1.8 10*3/uL (ref 0.7–4.0)
Monocytes Absolute: 0.8 10*3/uL (ref 0.1–1.0)
Monocytes Relative: 14 % — ABNORMAL HIGH (ref 3–12)
Neutro Abs: 3.2 10*3/uL (ref 1.7–7.7)

## 2010-05-18 LAB — TYPE AND SCREEN: Antibody Screen: POSITIVE

## 2010-05-18 LAB — URINALYSIS, MICROSCOPIC ONLY
Bilirubin Urine: NEGATIVE
Hgb urine dipstick: NEGATIVE
Ketones, ur: NEGATIVE mg/dL
Nitrite: NEGATIVE
Specific Gravity, Urine: 1.019 (ref 1.005–1.030)
Urobilinogen, UA: 0.2 mg/dL (ref 0.0–1.0)

## 2010-05-18 LAB — PHOSPHORUS
Phosphorus: 2.6 mg/dL (ref 2.3–4.6)
Phosphorus: 3.4 mg/dL (ref 2.3–4.6)
Phosphorus: 3.7 mg/dL (ref 2.3–4.6)
Phosphorus: 4.7 mg/dL — ABNORMAL HIGH (ref 2.3–4.6)
Phosphorus: 5 mg/dL — ABNORMAL HIGH (ref 2.3–4.6)

## 2010-05-18 LAB — CARDIAC PANEL(CRET KIN+CKTOT+MB+TROPI)
CK, MB: 2.9 ng/mL (ref 0.3–4.0)
Relative Index: INVALID (ref 0.0–2.5)
Total CK: 26 U/L (ref 7–177)
Troponin I: 0.31 ng/mL — ABNORMAL HIGH (ref 0.00–0.06)

## 2010-05-18 LAB — TRIGLYCERIDES
Triglycerides: 121 mg/dL (ref <150)
Triglycerides: 175 mg/dL — ABNORMAL HIGH (ref <150)

## 2010-05-18 LAB — PREALBUMIN
Prealbumin: 12.2 mg/dL — ABNORMAL LOW (ref 18.0–45.0)
Prealbumin: 15.2 mg/dL — ABNORMAL LOW (ref 18.0–45.0)

## 2010-05-19 LAB — PREALBUMIN
Prealbumin: 11.4 mg/dL — ABNORMAL LOW (ref 18.0–45.0)
Prealbumin: 3.7 mg/dL — ABNORMAL LOW (ref 18.0–45.0)

## 2010-05-19 LAB — POCT I-STAT 3, ART BLOOD GAS (G3+)
Acid-Base Excess: 22 mmol/L — ABNORMAL HIGH (ref 0.0–2.0)
Acid-base deficit: 11 mmol/L — ABNORMAL HIGH (ref 0.0–2.0)
Acid-base deficit: 12 mmol/L — ABNORMAL HIGH (ref 0.0–2.0)
Bicarbonate: 43.6 mEq/L — ABNORMAL HIGH (ref 20.0–24.0)
Bicarbonate: 46.7 mEq/L — ABNORMAL HIGH (ref 20.0–24.0)
O2 Saturation: 95 %
O2 Saturation: 97 %
O2 Saturation: 99 %
Patient temperature: 97.5
Patient temperature: 97.9
Patient temperature: 98.6
Patient temperature: 98.7
TCO2: 15 mmol/L (ref 0–100)
TCO2: 25 mmol/L (ref 0–100)
TCO2: 45 mmol/L (ref 0–100)
TCO2: 48 mmol/L (ref 0–100)
TCO2: >50 mmol/L (ref 0–100)
pCO2 arterial: 37.7 mmHg (ref 35.0–45.0)
pCO2 arterial: 48.5 mmHg — ABNORMAL HIGH (ref 35.0–45.0)
pCO2 arterial: 48.5 mmHg — ABNORMAL HIGH (ref 35.0–45.0)
pCO2 arterial: 51.1 mmHg — ABNORMAL HIGH (ref 35.0–45.0)
pH, Arterial: 7.255 — ABNORMAL LOW (ref 7.350–7.400)
pH, Arterial: 7.56 — ABNORMAL HIGH (ref 7.350–7.400)
pH, Arterial: 7.567 — ABNORMAL HIGH (ref 7.350–7.400)
pH, Arterial: 7.612 (ref 7.350–7.400)
pO2, Arterial: 147 mmHg — ABNORMAL HIGH (ref 80.0–100.0)
pO2, Arterial: 69 mmHg — ABNORMAL LOW (ref 80.0–100.0)
pO2, Arterial: 74 mmHg — ABNORMAL LOW (ref 80.0–100.0)
pO2, Arterial: 77 mmHg — ABNORMAL LOW (ref 80.0–100.0)

## 2010-05-19 LAB — CBC
HCT: 22.5 % — ABNORMAL LOW (ref 36.0–46.0)
HCT: 23.6 % — ABNORMAL LOW (ref 36.0–46.0)
HCT: 24.5 % — ABNORMAL LOW (ref 36.0–46.0)
HCT: 24.7 % — ABNORMAL LOW (ref 36.0–46.0)
HCT: 26.1 % — ABNORMAL LOW (ref 36.0–46.0)
HCT: 27.4 % — ABNORMAL LOW (ref 36.0–46.0)
HCT: 27.9 % — ABNORMAL LOW (ref 36.0–46.0)
HCT: 28.4 % — ABNORMAL LOW (ref 36.0–46.0)
HCT: 30.4 % — ABNORMAL LOW (ref 36.0–46.0)
Hemoglobin: 11 g/dL — ABNORMAL LOW (ref 12.0–15.0)
Hemoglobin: 11.1 g/dL — ABNORMAL LOW (ref 12.0–15.0)
Hemoglobin: 7.6 g/dL — CL (ref 12.0–15.0)
Hemoglobin: 8.3 g/dL — ABNORMAL LOW (ref 12.0–15.0)
Hemoglobin: 8.4 g/dL — ABNORMAL LOW (ref 12.0–15.0)
Hemoglobin: 8.5 g/dL — ABNORMAL LOW (ref 12.0–15.0)
Hemoglobin: 9.2 g/dL — ABNORMAL LOW (ref 12.0–15.0)
Hemoglobin: 9.2 g/dL — ABNORMAL LOW (ref 12.0–15.0)
Hemoglobin: 9.3 g/dL — ABNORMAL LOW (ref 12.0–15.0)
Hemoglobin: 9.3 g/dL — ABNORMAL LOW (ref 12.0–15.0)
Hemoglobin: 9.5 g/dL — ABNORMAL LOW (ref 12.0–15.0)
Hemoglobin: 9.7 g/dL — ABNORMAL LOW (ref 12.0–15.0)
Hemoglobin: 9.9 g/dL — ABNORMAL LOW (ref 12.0–15.0)
MCHC: 32.7 g/dL (ref 30.0–36.0)
MCHC: 33.3 g/dL (ref 30.0–36.0)
MCHC: 33.6 g/dL (ref 30.0–36.0)
MCHC: 33.6 g/dL (ref 30.0–36.0)
MCHC: 34 g/dL (ref 30.0–36.0)
MCHC: 34.1 g/dL (ref 30.0–36.0)
MCHC: 34.3 g/dL (ref 30.0–36.0)
MCHC: 35.3 g/dL (ref 30.0–36.0)
MCHC: 35.8 g/dL (ref 30.0–36.0)
MCV: 84 fL (ref 78.0–100.0)
MCV: 84.6 fL (ref 78.0–100.0)
MCV: 85.7 fL (ref 78.0–100.0)
MCV: 85.8 fL (ref 78.0–100.0)
MCV: 86.9 fL (ref 78.0–100.0)
MCV: 89.7 fL (ref 78.0–100.0)
Platelets: 130 10*3/uL — ABNORMAL LOW (ref 150–400)
Platelets: 177 10*3/uL (ref 150–400)
Platelets: 204 10*3/uL (ref 150–400)
Platelets: 257 10*3/uL (ref 150–400)
RBC: 2.62 MIL/uL — ABNORMAL LOW (ref 3.87–5.11)
RBC: 2.79 MIL/uL — ABNORMAL LOW (ref 3.87–5.11)
RBC: 2.88 MIL/uL — ABNORMAL LOW (ref 3.87–5.11)
RBC: 2.94 MIL/uL — ABNORMAL LOW (ref 3.87–5.11)
RBC: 2.96 MIL/uL — ABNORMAL LOW (ref 3.87–5.11)
RBC: 3.02 MIL/uL — ABNORMAL LOW (ref 3.87–5.11)
RBC: 3.1 MIL/uL — ABNORMAL LOW (ref 3.87–5.11)
RBC: 3.11 MIL/uL — ABNORMAL LOW (ref 3.87–5.11)
RBC: 3.14 MIL/uL — ABNORMAL LOW (ref 3.87–5.11)
RBC: 3.24 MIL/uL — ABNORMAL LOW (ref 3.87–5.11)
RBC: 3.27 MIL/uL — ABNORMAL LOW (ref 3.87–5.11)
RBC: 3.42 MIL/uL — ABNORMAL LOW (ref 3.87–5.11)
RBC: 3.64 MIL/uL — ABNORMAL LOW (ref 3.87–5.11)
RBC: 3.68 MIL/uL — ABNORMAL LOW (ref 3.87–5.11)
RDW: 15.3 % (ref 11.5–15.5)
RDW: 15.9 % — ABNORMAL HIGH (ref 11.5–15.5)
RDW: 16 % — ABNORMAL HIGH (ref 11.5–15.5)
RDW: 16.2 % — ABNORMAL HIGH (ref 11.5–15.5)
RDW: 16.7 % — ABNORMAL HIGH (ref 11.5–15.5)
RDW: 18.5 % — ABNORMAL HIGH (ref 11.5–15.5)
WBC: 10.2 10*3/uL (ref 4.0–10.5)
WBC: 10.2 10*3/uL (ref 4.0–10.5)
WBC: 11.3 10*3/uL — ABNORMAL HIGH (ref 4.0–10.5)
WBC: 11.5 10*3/uL — ABNORMAL HIGH (ref 4.0–10.5)
WBC: 5.1 10*3/uL (ref 4.0–10.5)
WBC: 6.1 10*3/uL (ref 4.0–10.5)
WBC: 6.2 10*3/uL (ref 4.0–10.5)
WBC: 6.7 10*3/uL (ref 4.0–10.5)
WBC: 7.4 10*3/uL (ref 4.0–10.5)
WBC: 7.7 10*3/uL (ref 4.0–10.5)
WBC: 7.9 10*3/uL (ref 4.0–10.5)

## 2010-05-19 LAB — CROSSMATCH
DAT, IgG: POSITIVE
Donor AG Type: NEGATIVE
Donor AG Type: NEGATIVE
Donor AG Type: NEGATIVE

## 2010-05-19 LAB — CULTURE, BLOOD (ROUTINE X 2)
Culture: NO GROWTH
Culture: NO GROWTH
Culture: NO GROWTH

## 2010-05-19 LAB — GLUCOSE, CAPILLARY
Glucose-Capillary: 101 mg/dL — ABNORMAL HIGH (ref 70–99)
Glucose-Capillary: 103 mg/dL — ABNORMAL HIGH (ref 70–99)
Glucose-Capillary: 108 mg/dL — ABNORMAL HIGH (ref 70–99)
Glucose-Capillary: 109 mg/dL — ABNORMAL HIGH (ref 70–99)
Glucose-Capillary: 115 mg/dL — ABNORMAL HIGH (ref 70–99)
Glucose-Capillary: 116 mg/dL — ABNORMAL HIGH (ref 70–99)
Glucose-Capillary: 116 mg/dL — ABNORMAL HIGH (ref 70–99)
Glucose-Capillary: 118 mg/dL — ABNORMAL HIGH (ref 70–99)
Glucose-Capillary: 118 mg/dL — ABNORMAL HIGH (ref 70–99)
Glucose-Capillary: 118 mg/dL — ABNORMAL HIGH (ref 70–99)
Glucose-Capillary: 124 mg/dL — ABNORMAL HIGH (ref 70–99)
Glucose-Capillary: 124 mg/dL — ABNORMAL HIGH (ref 70–99)
Glucose-Capillary: 124 mg/dL — ABNORMAL HIGH (ref 70–99)
Glucose-Capillary: 124 mg/dL — ABNORMAL HIGH (ref 70–99)
Glucose-Capillary: 126 mg/dL — ABNORMAL HIGH (ref 70–99)
Glucose-Capillary: 128 mg/dL — ABNORMAL HIGH (ref 70–99)
Glucose-Capillary: 128 mg/dL — ABNORMAL HIGH (ref 70–99)
Glucose-Capillary: 128 mg/dL — ABNORMAL HIGH (ref 70–99)
Glucose-Capillary: 128 mg/dL — ABNORMAL HIGH (ref 70–99)
Glucose-Capillary: 129 mg/dL — ABNORMAL HIGH (ref 70–99)
Glucose-Capillary: 131 mg/dL — ABNORMAL HIGH (ref 70–99)
Glucose-Capillary: 134 mg/dL — ABNORMAL HIGH (ref 70–99)
Glucose-Capillary: 135 mg/dL — ABNORMAL HIGH (ref 70–99)
Glucose-Capillary: 140 mg/dL — ABNORMAL HIGH (ref 70–99)
Glucose-Capillary: 142 mg/dL — ABNORMAL HIGH (ref 70–99)
Glucose-Capillary: 142 mg/dL — ABNORMAL HIGH (ref 70–99)
Glucose-Capillary: 143 mg/dL — ABNORMAL HIGH (ref 70–99)
Glucose-Capillary: 146 mg/dL — ABNORMAL HIGH (ref 70–99)
Glucose-Capillary: 151 mg/dL — ABNORMAL HIGH (ref 70–99)
Glucose-Capillary: 154 mg/dL — ABNORMAL HIGH (ref 70–99)
Glucose-Capillary: 157 mg/dL — ABNORMAL HIGH (ref 70–99)
Glucose-Capillary: 159 mg/dL — ABNORMAL HIGH (ref 70–99)
Glucose-Capillary: 159 mg/dL — ABNORMAL HIGH (ref 70–99)
Glucose-Capillary: 161 mg/dL — ABNORMAL HIGH (ref 70–99)
Glucose-Capillary: 161 mg/dL — ABNORMAL HIGH (ref 70–99)
Glucose-Capillary: 162 mg/dL — ABNORMAL HIGH (ref 70–99)
Glucose-Capillary: 165 mg/dL — ABNORMAL HIGH (ref 70–99)
Glucose-Capillary: 168 mg/dL — ABNORMAL HIGH (ref 70–99)
Glucose-Capillary: 170 mg/dL — ABNORMAL HIGH (ref 70–99)
Glucose-Capillary: 179 mg/dL — ABNORMAL HIGH (ref 70–99)
Glucose-Capillary: 184 mg/dL — ABNORMAL HIGH (ref 70–99)
Glucose-Capillary: 184 mg/dL — ABNORMAL HIGH (ref 70–99)
Glucose-Capillary: 188 mg/dL — ABNORMAL HIGH (ref 70–99)
Glucose-Capillary: 265 mg/dL — ABNORMAL HIGH (ref 70–99)
Glucose-Capillary: 66 mg/dL — ABNORMAL LOW (ref 70–99)
Glucose-Capillary: 76 mg/dL (ref 70–99)
Glucose-Capillary: 88 mg/dL (ref 70–99)
Glucose-Capillary: 91 mg/dL (ref 70–99)
Glucose-Capillary: 97 mg/dL (ref 70–99)
Glucose-Capillary: 98 mg/dL (ref 70–99)
Glucose-Capillary: 98 mg/dL (ref 70–99)

## 2010-05-19 LAB — BASIC METABOLIC PANEL
BUN: 35 mg/dL — ABNORMAL HIGH (ref 6–23)
BUN: 55 mg/dL — ABNORMAL HIGH (ref 6–23)
CO2: 29 mEq/L (ref 19–32)
CO2: 34 mEq/L — ABNORMAL HIGH (ref 19–32)
CO2: 35 mEq/L — ABNORMAL HIGH (ref 19–32)
CO2: 38 mEq/L — ABNORMAL HIGH (ref 19–32)
Calcium: 7.4 mg/dL — ABNORMAL LOW (ref 8.4–10.5)
Calcium: 7.6 mg/dL — ABNORMAL LOW (ref 8.4–10.5)
Calcium: 7.6 mg/dL — ABNORMAL LOW (ref 8.4–10.5)
Calcium: 8.1 mg/dL — ABNORMAL LOW (ref 8.4–10.5)
Calcium: 8.2 mg/dL — ABNORMAL LOW (ref 8.4–10.5)
Chloride: 101 mEq/L (ref 96–112)
Chloride: 104 mEq/L (ref 96–112)
Chloride: 106 mEq/L (ref 96–112)
Chloride: 92 mEq/L — ABNORMAL LOW (ref 96–112)
Chloride: 97 mEq/L (ref 96–112)
Creatinine, Ser: 0.88 mg/dL (ref 0.4–1.2)
Creatinine, Ser: 0.91 mg/dL (ref 0.4–1.2)
Creatinine, Ser: 1.13 mg/dL (ref 0.4–1.2)
Creatinine, Ser: 4.45 mg/dL — ABNORMAL HIGH (ref 0.4–1.2)
GFR calc Af Amer: 12 mL/min — ABNORMAL LOW (ref >60)
GFR calc Af Amer: 28 mL/min — ABNORMAL LOW (ref >60)
GFR calc Af Amer: 56 mL/min — ABNORMAL LOW (ref >60)
GFR calc Af Amer: 57 mL/min — ABNORMAL LOW (ref >60)
GFR calc Af Amer: 58 mL/min — ABNORMAL LOW (ref >60)
GFR calc Af Amer: >60 mL/min (ref >60)
GFR calc non Af Amer: 31 mL/min — ABNORMAL LOW (ref >60)
GFR calc non Af Amer: 35 mL/min — ABNORMAL LOW (ref >60)
GFR calc non Af Amer: 40 mL/min — ABNORMAL LOW (ref >60)
GFR calc non Af Amer: 46 mL/min — ABNORMAL LOW (ref >60)
GFR calc non Af Amer: 48 mL/min — ABNORMAL LOW (ref >60)
GFR calc non Af Amer: >60 mL/min (ref >60)
GFR calc non Af Amer: >60 mL/min (ref >60)
Glucose, Bld: 101 mg/dL — ABNORMAL HIGH (ref 70–99)
Glucose, Bld: 119 mg/dL — ABNORMAL HIGH (ref 70–99)
Glucose, Bld: 125 mg/dL — ABNORMAL HIGH (ref 70–99)
Glucose, Bld: 137 mg/dL — ABNORMAL HIGH (ref 70–99)
Glucose, Bld: 159 mg/dL — ABNORMAL HIGH (ref 70–99)
Glucose, Bld: 160 mg/dL — ABNORMAL HIGH (ref 70–99)
Potassium: 2.6 mEq/L — CL (ref 3.5–5.1)
Potassium: 2.6 mEq/L — CL (ref 3.5–5.1)
Potassium: 2.7 mEq/L — CL (ref 3.5–5.1)
Potassium: 3 mEq/L — ABNORMAL LOW (ref 3.5–5.1)
Potassium: 3 mEq/L — ABNORMAL LOW (ref 3.5–5.1)
Potassium: 3.5 mEq/L (ref 3.5–5.1)
Potassium: 4.3 mEq/L (ref 3.5–5.1)
Sodium: 131 mEq/L — ABNORMAL LOW (ref 135–145)
Sodium: 137 mEq/L (ref 135–145)
Sodium: 141 mEq/L (ref 135–145)
Sodium: 142 mEq/L (ref 135–145)
Sodium: 142 mEq/L (ref 135–145)
Sodium: 143 mEq/L (ref 135–145)
Sodium: 143 mEq/L (ref 135–145)
Sodium: 143 mEq/L (ref 135–145)
Sodium: 148 mEq/L — ABNORMAL HIGH (ref 135–145)

## 2010-05-19 LAB — DIFFERENTIAL
Basophils Absolute: 0 10*3/uL (ref 0.0–0.1)
Basophils Absolute: 0 10*3/uL (ref 0.0–0.1)
Basophils Absolute: 0 10*3/uL (ref 0.0–0.1)
Basophils Absolute: 0 10*3/uL (ref 0.0–0.1)
Basophils Relative: 0 % (ref 0–1)
Basophils Relative: 0 % (ref 0–1)
Blasts: 0 %
Eosinophils Absolute: 0 10*3/uL (ref 0.0–0.7)
Eosinophils Absolute: 0 10*3/uL (ref 0.0–0.7)
Eosinophils Absolute: 0.1 10*3/uL (ref 0.0–0.7)
Eosinophils Absolute: 0.1 10*3/uL (ref 0.0–0.7)
Eosinophils Absolute: 0.1 10*3/uL (ref 0.0–0.7)
Eosinophils Relative: 1 % (ref 0–5)
Eosinophils Relative: 1 % (ref 0–5)
Eosinophils Relative: 1 % (ref 0–5)
Lymphocytes Relative: 10 % — ABNORMAL LOW (ref 12–46)
Lymphocytes Relative: 12 % (ref 12–46)
Lymphocytes Relative: 9 % — ABNORMAL LOW (ref 12–46)
Lymphs Abs: 0.5 10*3/uL — ABNORMAL LOW (ref 0.7–4.0)
Lymphs Abs: 0.7 10*3/uL (ref 0.7–4.0)
Lymphs Abs: 0.8 10*3/uL (ref 0.7–4.0)
Lymphs Abs: 0.9 10*3/uL (ref 0.7–4.0)
Metamyelocytes Relative: 0 %
Monocytes Absolute: 0.1 10*3/uL (ref 0.1–1.0)
Monocytes Absolute: 0.6 10*3/uL (ref 0.1–1.0)
Monocytes Absolute: 0.7 10*3/uL (ref 0.1–1.0)
Monocytes Relative: 2 % — ABNORMAL LOW (ref 3–12)
Monocytes Relative: 9 % (ref 3–12)
Monocytes Relative: 9 % (ref 3–12)
Myelocytes: 0 %
Neutro Abs: 4.7 10*3/uL (ref 1.7–7.7)
Neutro Abs: 6.1 10*3/uL (ref 1.7–7.7)
Neutro Abs: 6.1 10*3/uL (ref 1.7–7.7)
Neutro Abs: 7.4 10*3/uL (ref 1.7–7.7)
Neutro Abs: 8.3 10*3/uL — ABNORMAL HIGH (ref 1.7–7.7)
Neutrophils Relative %: 88 % — ABNORMAL HIGH (ref 43–77)
Neutrophils Relative %: 90 % — ABNORMAL HIGH (ref 43–77)
WBC Morphology: INCREASED
nRBC: 0 /100 WBC

## 2010-05-19 LAB — RENAL FUNCTION PANEL
Albumin: 1.4 g/dL — ABNORMAL LOW (ref 3.5–5.2)
Albumin: 1.6 g/dL — ABNORMAL LOW (ref 3.5–5.2)
Albumin: 2.1 g/dL — ABNORMAL LOW (ref 3.5–5.2)
Albumin: 2.8 g/dL — ABNORMAL LOW (ref 3.5–5.2)
BUN: 50 mg/dL — ABNORMAL HIGH (ref 6–23)
BUN: 58 mg/dL — ABNORMAL HIGH (ref 6–23)
BUN: 67 mg/dL — ABNORMAL HIGH (ref 6–23)
CO2: 32 mEq/L (ref 19–32)
CO2: 36 mEq/L — ABNORMAL HIGH (ref 19–32)
CO2: 41 mEq/L — ABNORMAL HIGH (ref 19–32)
Calcium: 7.4 mg/dL — ABNORMAL LOW (ref 8.4–10.5)
Calcium: 7.5 mg/dL — ABNORMAL LOW (ref 8.4–10.5)
Chloride: 92 mEq/L — ABNORMAL LOW (ref 96–112)
Creatinine, Ser: 3.96 mg/dL — ABNORMAL HIGH (ref 0.4–1.2)
Creatinine, Ser: 5.09 mg/dL — ABNORMAL HIGH (ref 0.4–1.2)
GFR calc Af Amer: 25 mL/min — ABNORMAL LOW (ref >60)
GFR calc non Af Amer: 23 mL/min — ABNORMAL LOW (ref >60)
Glucose, Bld: 101 mg/dL — ABNORMAL HIGH (ref 70–99)
Glucose, Bld: 114 mg/dL — ABNORMAL HIGH (ref 70–99)
Glucose, Bld: 125 mg/dL — ABNORMAL HIGH (ref 70–99)
Glucose, Bld: 166 mg/dL — ABNORMAL HIGH (ref 70–99)
Phosphorus: 3.1 mg/dL (ref 2.3–4.6)
Phosphorus: 3.5 mg/dL (ref 2.3–4.6)
Phosphorus: 4.4 mg/dL (ref 2.3–4.6)
Phosphorus: 4.8 mg/dL — ABNORMAL HIGH (ref 2.3–4.6)
Potassium: 2.8 mEq/L — ABNORMAL LOW (ref 3.5–5.1)
Potassium: 2.8 mEq/L — ABNORMAL LOW (ref 3.5–5.1)
Potassium: 3 mEq/L — ABNORMAL LOW (ref 3.5–5.1)
Potassium: 3.4 mEq/L — ABNORMAL LOW (ref 3.5–5.1)
Sodium: 131 mEq/L — ABNORMAL LOW (ref 135–145)
Sodium: 134 mEq/L — ABNORMAL LOW (ref 135–145)
Sodium: 134 mEq/L — ABNORMAL LOW (ref 135–145)

## 2010-05-19 LAB — COMPREHENSIVE METABOLIC PANEL
ALT: 12 U/L (ref 0–35)
ALT: 19 U/L (ref 0–35)
ALT: 22 U/L (ref 0–35)
ALT: 22 U/L (ref 0–35)
AST: 22 U/L (ref 0–37)
AST: 22 U/L (ref 0–37)
AST: 26 U/L (ref 0–37)
AST: 64 U/L — ABNORMAL HIGH (ref 0–37)
Albumin: 1.3 g/dL — ABNORMAL LOW (ref 3.5–5.2)
Albumin: 1.6 g/dL — ABNORMAL LOW (ref 3.5–5.2)
Alkaline Phosphatase: 57 U/L (ref 39–117)
Alkaline Phosphatase: 62 U/L (ref 39–117)
Alkaline Phosphatase: 72 U/L (ref 39–117)
Alkaline Phosphatase: 75 U/L (ref 39–117)
Alkaline Phosphatase: 79 U/L (ref 39–117)
BUN: 22 mg/dL (ref 6–23)
BUN: 58 mg/dL — ABNORMAL HIGH (ref 6–23)
BUN: 62 mg/dL — ABNORMAL HIGH (ref 6–23)
CO2: 28 mEq/L (ref 19–32)
CO2: 37 mEq/L — ABNORMAL HIGH (ref 19–32)
CO2: 38 mEq/L — ABNORMAL HIGH (ref 19–32)
CO2: 40 mEq/L — ABNORMAL HIGH (ref 19–32)
Calcium: 7.5 mg/dL — ABNORMAL LOW (ref 8.4–10.5)
Calcium: 8 mg/dL — ABNORMAL LOW (ref 8.4–10.5)
Calcium: 8.2 mg/dL — ABNORMAL LOW (ref 8.4–10.5)
Chloride: 105 mEq/L (ref 96–112)
Chloride: 109 mEq/L (ref 96–112)
Chloride: 87 mEq/L — ABNORMAL LOW (ref 96–112)
Chloride: 95 mEq/L — ABNORMAL LOW (ref 96–112)
Creatinine, Ser: 1.02 mg/dL (ref 0.4–1.2)
Creatinine, Ser: 2.33 mg/dL — ABNORMAL HIGH (ref 0.4–1.2)
GFR calc Af Amer: 25 mL/min — ABNORMAL LOW (ref >60)
GFR calc Af Amer: 34 mL/min — ABNORMAL LOW (ref >60)
GFR calc Af Amer: >60 mL/min (ref >60)
GFR calc Af Amer: >60 mL/min (ref >60)
GFR calc non Af Amer: 20 mL/min — ABNORMAL LOW (ref >60)
GFR calc non Af Amer: 20 mL/min — ABNORMAL LOW (ref >60)
GFR calc non Af Amer: 28 mL/min — ABNORMAL LOW (ref >60)
GFR calc non Af Amer: 57 mL/min — ABNORMAL LOW (ref >60)
GFR calc non Af Amer: 8 mL/min — ABNORMAL LOW (ref >60)
Glucose, Bld: 121 mg/dL — ABNORMAL HIGH (ref 70–99)
Glucose, Bld: 141 mg/dL — ABNORMAL HIGH (ref 70–99)
Glucose, Bld: 79 mg/dL (ref 70–99)
Potassium: 2.9 mEq/L — ABNORMAL LOW (ref 3.5–5.1)
Potassium: 3.7 mEq/L (ref 3.5–5.1)
Potassium: 5.9 mEq/L — ABNORMAL HIGH (ref 3.5–5.1)
Sodium: 140 mEq/L (ref 135–145)
Sodium: 143 mEq/L (ref 135–145)
Sodium: 145 mEq/L (ref 135–145)
Total Bilirubin: 0.9 mg/dL (ref 0.3–1.2)
Total Bilirubin: 0.9 mg/dL (ref 0.3–1.2)
Total Bilirubin: 1.3 mg/dL — ABNORMAL HIGH (ref 0.3–1.2)
Total Protein: 4.6 g/dL — ABNORMAL LOW (ref 6.0–8.3)
Total Protein: 5 g/dL — ABNORMAL LOW (ref 6.0–8.3)
Total Protein: 5.8 g/dL — ABNORMAL LOW (ref 6.0–8.3)

## 2010-05-19 LAB — BLOOD GAS, ARTERIAL
Acid-Base Excess: 16.5 mmol/L — ABNORMAL HIGH (ref 0.0–2.0)
FIO2: 0.4 %
FIO2: 0.4 %
MECHVT: 400 mL
O2 Content: 4 L/min
PEEP: 5 cmH2O
RATE: 8 resp/min
TCO2: 41.1 mmol/L (ref 0–100)
TCO2: 42.7 mmol/L (ref 0–100)
pCO2 arterial: 48.2 mmHg — ABNORMAL HIGH (ref 35.0–45.0)
pCO2 arterial: 50.7 mmHg — ABNORMAL HIGH (ref 35.0–45.0)
pCO2 arterial: 53.5 mmHg — ABNORMAL HIGH (ref 35.0–45.0)
pH, Arterial: 7.511 — ABNORMAL HIGH (ref 7.350–7.400)
pH, Arterial: 7.517 — ABNORMAL HIGH (ref 7.350–7.400)
pH, Arterial: 7.524 — ABNORMAL HIGH (ref 7.350–7.400)
pO2, Arterial: 49.8 mmHg — ABNORMAL LOW (ref 80.0–100.0)
pO2, Arterial: 82.1 mmHg (ref 80.0–100.0)
pO2, Arterial: 95.1 mmHg (ref 80.0–100.0)

## 2010-05-19 LAB — CARDIAC PANEL(CRET KIN+CKTOT+MB+TROPI)
CK, MB: 11.7 ng/mL — ABNORMAL HIGH (ref 0.3–4.0)
CK, MB: 21.1 ng/mL — ABNORMAL HIGH (ref 0.3–4.0)
Relative Index: 3.1 — ABNORMAL HIGH (ref 0.0–2.5)

## 2010-05-19 LAB — TYPE AND SCREEN
ABO/RH(D): O POS
DAT, IgG: NEGATIVE
Donor AG Type: NEGATIVE
Donor AG Type: NEGATIVE
PT AG Type: NEGATIVE

## 2010-05-19 LAB — POCT I-STAT 7, (LYTES, BLD GAS, ICA,H+H)
Acid-base deficit: 11 mmol/L — ABNORMAL HIGH (ref 0.0–2.0)
Bicarbonate: 16.9 mEq/L — ABNORMAL LOW (ref 20.0–24.0)
HCT: 24 % — ABNORMAL LOW (ref 36.0–46.0)
Hemoglobin: 8.8 g/dL — ABNORMAL LOW (ref 12.0–15.0)
O2 Saturation: 100 %
O2 Saturation: 100 %
Potassium: 5.1 mEq/L (ref 3.5–5.1)
Sodium: 130 mEq/L — ABNORMAL LOW (ref 135–145)
TCO2: 18 mmol/L (ref 0–100)
TCO2: 18 mmol/L (ref 0–100)
pCO2 arterial: 31.7 mmHg — ABNORMAL LOW (ref 35.0–45.0)
pH, Arterial: 7.343 — ABNORMAL LOW (ref 7.350–7.400)
pO2, Arterial: 237 mmHg — ABNORMAL HIGH (ref 80.0–100.0)
pO2, Arterial: 274 mmHg — ABNORMAL HIGH (ref 80.0–100.0)

## 2010-05-19 LAB — TRIGLYCERIDES
Triglycerides: 136 mg/dL (ref <150)
Triglycerides: 149 mg/dL (ref <150)
Triglycerides: 184 mg/dL — ABNORMAL HIGH (ref <150)

## 2010-05-19 LAB — WOUND CULTURE

## 2010-05-19 LAB — URINALYSIS, ROUTINE W REFLEX MICROSCOPIC
Ketones, ur: NEGATIVE mg/dL
Nitrite: NEGATIVE
Specific Gravity, Urine: 1.016 (ref 1.005–1.030)
pH: 5 (ref 5.0–8.0)

## 2010-05-19 LAB — URINE MICROSCOPIC-ADD ON

## 2010-05-19 LAB — CULTURE, BLOOD (SINGLE)
Culture: NO GROWTH
Culture: NO GROWTH

## 2010-05-19 LAB — MAGNESIUM
Magnesium: 1.4 mg/dL — ABNORMAL LOW (ref 1.5–2.5)
Magnesium: 1.4 mg/dL — ABNORMAL LOW (ref 1.5–2.5)
Magnesium: 1.8 mg/dL (ref 1.5–2.5)
Magnesium: 2 mg/dL (ref 1.5–2.5)
Magnesium: 2.2 mg/dL (ref 1.5–2.5)
Magnesium: 2.3 mg/dL (ref 1.5–2.5)

## 2010-05-19 LAB — PROTIME-INR
INR: 1.4 (ref 0.00–1.49)
INR: 1.6 — ABNORMAL HIGH (ref 0.00–1.49)
Prothrombin Time: 17.4 seconds — ABNORMAL HIGH (ref 11.6–15.2)

## 2010-05-19 LAB — APTT
aPTT: 28 seconds (ref 24–37)
aPTT: 34 seconds (ref 24–37)
aPTT: 38 seconds — ABNORMAL HIGH (ref 24–37)
aPTT: 39 seconds — ABNORMAL HIGH (ref 24–37)
aPTT: 39 seconds — ABNORMAL HIGH (ref 24–37)

## 2010-05-19 LAB — TSH: TSH: 9.6 u[IU]/mL — ABNORMAL HIGH (ref 0.350–4.500)

## 2010-05-19 LAB — PHOSPHORUS
Phosphorus: 2.5 mg/dL (ref 2.3–4.6)
Phosphorus: 3.4 mg/dL (ref 2.3–4.6)
Phosphorus: 4.9 mg/dL — ABNORMAL HIGH (ref 2.3–4.6)

## 2010-05-19 LAB — CARBOXYHEMOGLOBIN: Methemoglobin: 0.8 % (ref 0.0–1.5)

## 2010-05-19 LAB — CHOLESTEROL, TOTAL: Cholesterol: 95 mg/dL (ref 0–200)

## 2010-05-19 LAB — PREPARE RBC (CROSSMATCH)

## 2010-05-19 LAB — MRSA PCR SCREENING: MRSA by PCR: NEGATIVE

## 2010-05-19 LAB — ANAEROBIC CULTURE

## 2010-05-21 NOTE — Consult Note (Signed)
NAME:  Karen [name], Karen [name]               ACCOUNT NO.:  [id number]  MEDICAL RECORD NO.:  [id number]           PATIENT TYPE:  I  LOCATION:  5118                         FACILITY:  [facility]  PHYSICIAN:  Altha Harm, MDDATE OF BIRTH:  Jun 15, 1929  DATE OF CONSULTATION:  04/21/2010 DATE OF DISCHARGE:                                CONSULTATION   REQUESTING PHYSICIAN:  Almond Lint, MD.  CHIEF COMPLAINT:  Decrease ostomy output and abdominal pain.  RECENT FOR CONSULTATION:  Management of diabetes, hypertension, and other medical issues.  HISTORY OF PRESENT ILLNESS:  Karen [name] is a lovely 77 year old female with a long complicated surgical history, who was recently admitted for small bowel obstruction.  The patient went home 2 days ago and was presented again to the emergency with recurrent abdominal pain for the last 24 hours accompanied by nausea and vomiting.  The patient comes in with crampy abdominal pain, shortness of breath, which is now improved.The patient continues to have bloating, abdominal pain, nausea, and vomiting.  She is being admitted by the surgical service.  Dr. Donell Beers is asked to see the patient in consultation to assist with management of her medical issues.  Presently, the patient states that her blood sugars in terms of her diabetes had been well controlled at home.  However, her oral intake has been decreased.  She has no chest pain.  No dizziness.  No headaches. No blurring of vision.  No cough.  No difficulty with breathing at present.  No wheezing.  She has no difficulty with urination.  PAST MEDICAL HISTORY:  Significant for: 1. Colorectal cancer status post hemicolectomy. 2. Coronary artery disease. 3. Hypertension. 4. Hypothyroidism. 5. Left bundle branch block. 6. Gastroesophageal reflux disease. 7. Bladder cancer. 8. Abnormal lipids. 9. Chronic diastolic dysfunction.  FAMILY HISTORY:  Significant for cancer in her siblings and coronary artery  disease in her brother and a CVA in her mother.  SOCIAL HISTORY:  The patient lives with her husband.  She has a very supportive children.  Her son and daughter were here with her.  She denies any tobacco, alcohol, or drug use.  MEDICATIONS AT HOME:  Include the following: 1. Zofran 4 mg p.o. q.6 h. p.r.n. 2. Tylenol 650 mg p.o. q.6 h. p.r.n. 3. Diovan 160 mg p.o. daily. 4. Xanax 0.5 mg p.o. nightly. 5. Coreg 25 mg p.o. b.i.d. 6. Digoxin 0.125 mg p.o. daily. 7. Multivitamin 1 tablet p.o. daily. 8. Acyclovir 800 mg p.o. 5 times a day, which she takes only when she     has a shingles flare. 9. Hydrocodone/APAP 5/325 p.o. q.4 h. p.r.n. 10.Aspirin enteric-coated 325 mg p.o. daily. 11.Nexium 40 mg p.o. daily. 12.Ciprofloxacin 500 mg p.o. b.i.d., which the patient was taking for     partially treated urinary tract infection. 13.Synthroid 75 mcg p.o. daily. 14.Folic acid 1 mg p.o. daily.  ALLERGIES:  CODEINE, DEMEROL, MORPHINE, PENTAZOCINE, NITROFURANTOIN, and NOVOCAINE.  REVIEW OF SYSTEMS:  All other systems negative.  PHYSICAL EXAMINATION:  GENERAL:  The patient is laying in bed, in some distress due to her abdominal pain. VITAL SIGNS:  Her temperature is  99.3, heart rate 70, blood pressure 132/71, respiratory rate 20, O2 sat 100% on room air. HEENT:  She is normocephalic, atraumatic.  Pupils are equal, round, reactive to light, and accommodation.  Extraocular movements are intact. Oropharynx is moist.  No exudates, erythema, or lesions noted. NECK:  Trachea is midline.  No masses.  No thyromegaly.  No JVD.  No carotid bruit. RESPIRATORY:  The patient has a normal respiratory effort and equal excursion bilaterally.  No wheezing or rhonchi noted. ABDOMEN:  Soft.  There is mild distention and there is diffuse mild tenderness.  The patient had diminished bowel sounds. EXTREMITIES:  No clubbing, cyanosis, or edema.  Capillary refill is less than 3 seconds.  The patient has a  postsurgical area on the right pronator surface of her hand. NEUROLOGIC:  Cranial nerves II-XII are grossly intact.  No focal neurological deficits noted.  LABORATORY STUDIES:  Shows a white blood cell count of 7.2, hemoglobin of 11.3, hematocrit of 35.1, platelet count of 207.  Sodium 139, potassium 4.4, chloride 110, bicarb 20, BUN 31, creatinine 1.62.  Please note that baseline BUN is 26 and baseline creatinine is 1.0.  Review of her recent echo in 2010 shows an ejection fraction of 65%.  Urinalysis done in the emergency room shows no elements consistent with urinary tract infection.  Chest x-ray is negative for any acute cardiopulmonary conditions.  ASSESSMENT AND PLAN:  This is a patient who presents to the hospital with recurrent small bowel obstruction.  Additionally, the patient has the following medical conditions: 1. Hypothyroidism. 2. Hypertension. 3. Renal mass found on the CT of the abdomen of left kidney. 4. Partially treated urinary tract infection. 5. Prerenal azotemia.  RECOMMENDATIONS:  The patient should resume her Synthroid and half the dose by IV.  The patient has been kept n.p.o. that she needs to be on her rate control medications.  I will go ahead and place the patient on IV dose of Lopressor scheduled and additional p.r.n. medications for hypertension.  The patient shows evidence of a prerenal azotemia likely secondary to mild dehydration and I would recommend that the patient gets gentle IV fluids in order to keep her well hydrated.  In terms of her partially treated urinary tract infection, the patient has 3 remaining days of ciprofloxacin and this will be given by IV with a does a 400 mg q.12 hours for 3 days.  We will be happy to follow along with this patient.  I have informed the patient and her family of the renal mass found on the left kidney and the recommendations are per elective MRI of the kidneys with and without contrast for confirmation as  clinically indicated.     Altha Harm, MD     MAM/MEDQ  D:  04/24/2010  T:  04/25/2010  Job:  [id number]  Electronically Signed by Marthann Schiller MD on 05/21/2010 08:42:55 PM

## 2010-06-07 ENCOUNTER — Emergency Department (HOSPITAL_COMMUNITY): Payer: Medicare Other

## 2010-06-07 ENCOUNTER — Inpatient Hospital Stay (HOSPITAL_COMMUNITY)
Admission: EM | Admit: 2010-06-07 | Discharge: 2010-06-10 | DRG: 390 | Disposition: A | Discharge status: Home / Self Care | Payer: Medicare Other | Attending: Surgery | Admitting: Surgery

## 2010-06-07 DIAGNOSIS — K56609 Unspecified intestinal obstruction, unspecified as to partial versus complete obstruction: Principal | ICD-10-CM | POA: Diagnosis present

## 2010-06-07 DIAGNOSIS — Z7982 Long term (current) use of aspirin: Secondary | ICD-10-CM

## 2010-06-07 DIAGNOSIS — I2510 Atherosclerotic heart disease of native coronary artery without angina pectoris: Secondary | ICD-10-CM | POA: Diagnosis present

## 2010-06-07 DIAGNOSIS — I10 Essential (primary) hypertension: Secondary | ICD-10-CM | POA: Diagnosis present

## 2010-06-07 DIAGNOSIS — E039 Hypothyroidism, unspecified: Secondary | ICD-10-CM | POA: Diagnosis present

## 2010-06-07 DIAGNOSIS — Z8551 Personal history of malignant neoplasm of bladder: Secondary | ICD-10-CM

## 2010-06-07 DIAGNOSIS — Z85038 Personal history of other malignant neoplasm of large intestine: Secondary | ICD-10-CM

## 2010-06-07 LAB — COMPREHENSIVE METABOLIC PANEL
AST: 24 U/L (ref 0–37)
Albumin: 4.3 g/dL (ref 3.5–5.2)
Alkaline Phosphatase: 86 U/L (ref 39–117)
Chloride: 104 mEq/L (ref 96–112)
Creatinine, Ser: 0.92 mg/dL (ref 0.4–1.2)
GFR calc Af Amer: >60 mL/min (ref >60)
Potassium: 4 mEq/L (ref 3.5–5.1)
Sodium: 141 mEq/L (ref 135–145)
Total Bilirubin: 0.5 mg/dL (ref 0.3–1.2)

## 2010-06-07 LAB — CBC
Platelets: 202 10*3/uL (ref 150–400)
RBC: 4.21 MIL/uL (ref 3.87–5.11)
WBC: 8.8 10*3/uL (ref 4.0–10.5)

## 2010-06-07 LAB — DIFFERENTIAL
Basophils Relative: 0 % (ref 0–1)
Eosinophils Absolute: 0.1 10*3/uL (ref 0.0–0.7)
Neutrophils Relative %: 60 % (ref 43–77)

## 2010-06-08 ENCOUNTER — Inpatient Hospital Stay (HOSPITAL_COMMUNITY): Payer: Medicare Other

## 2010-06-08 LAB — DIFFERENTIAL
Eosinophils Absolute: 0.1 10*3/uL (ref 0.0–0.7)
Lymphs Abs: 1.4 10*3/uL (ref 0.7–4.0)
Monocytes Relative: 8 % (ref 3–12)
Neutrophils Relative %: 71 % (ref 43–77)

## 2010-06-08 LAB — BASIC METABOLIC PANEL
BUN: 15 mg/dL (ref 6–23)
CO2: 26 mEq/L (ref 19–32)
Chloride: 104 mEq/L (ref 96–112)
Creatinine, Ser: 0.81 mg/dL (ref 0.4–1.2)
Glucose, Bld: 121 mg/dL — ABNORMAL HIGH (ref 70–99)

## 2010-06-08 LAB — CBC
HCT: 34.2 % — ABNORMAL LOW (ref 36.0–46.0)
Hemoglobin: 11 g/dL — ABNORMAL LOW (ref 12.0–15.0)
MCH: 27.9 pg (ref 26.0–34.0)
MCV: 86.8 fL (ref 78.0–100.0)
Platelets: 159 10*3/uL (ref 150–400)
RBC: 3.94 MIL/uL (ref 3.87–5.11)

## 2010-06-08 LAB — URINALYSIS, ROUTINE W REFLEX MICROSCOPIC
Glucose, UA: NEGATIVE mg/dL
Hgb urine dipstick: NEGATIVE
Protein, ur: NEGATIVE mg/dL

## 2010-06-09 LAB — BASIC METABOLIC PANEL
BUN: 9 mg/dL (ref 6–23)
CO2: 27 mEq/L (ref 19–32)
Calcium: 8.8 mg/dL (ref 8.4–10.5)
Creatinine, Ser: 0.81 mg/dL (ref 0.4–1.2)
GFR calc Af Amer: >60 mL/min (ref >60)

## 2010-06-09 LAB — DIFFERENTIAL
Basophils Relative: 0 % (ref 0–1)
Eosinophils Absolute: 0.1 10*3/uL (ref 0.0–0.7)
Eosinophils Relative: 2 % (ref 0–5)
Monocytes Absolute: 0.5 10*3/uL (ref 0.1–1.0)
Monocytes Relative: 9 % (ref 3–12)

## 2010-06-09 LAB — CBC
MCH: 27.8 pg (ref 26.0–34.0)
MCHC: 31.6 g/dL (ref 30.0–36.0)
MCV: 88.1 fL (ref 78.0–100.0)
Platelets: 152 10*3/uL (ref 150–400)

## 2010-06-09 NOTE — H&P (Signed)
  NAME:  Karen [name], KLAPPER               ACCOUNT NO.:  [id number]  MEDICAL RECORD NO.:  [id number]           PATIENT TYPE:  I  LOCATION:  5118                         FACILITY:  [facility]  PHYSICIAN:  Ollen Gross. Vernell Morgans, M.D. DATE OF BIRTH:  October 04, 1929  DATE OF ADMISSION:  06/07/2010 DATE OF DISCHARGE:                             HISTORY & PHYSICAL   Ms. Karen [name] is a 77 year old white female, who presents with abdominal cramps that started a few hours ago.  She has had some nausea and vomiting associated with it over the last few hours.  She was apparently hospitalized in March twice with similar symptoms of partial SBO that resolved spontaneously.  She does have a very complicated abdominal history of multiple surgeries.  PAST MEDICAL HISTORY:  Significant for: 1. Colon cancer. 2. Coronary artery disease. 3. Hypertension. 4. Hypothyroidism. 5. Bladder cancer.  PAST SURGICAL HISTORY:  Significant for multiple abdominal surgeries for colectomy, ileostomy, and intracutaneous fistulas.  MEDICATIONS:  Zofran, Diovan, Xanax, Coreg, digoxin, acyclovir, Vicodin, aspirin, Nexium, Cipro, Synthroid, folate.  ALLERGIES:  CODEINE, DEMEROL, MORPHINE, TERAZOSIN, NITROFURANTOIN, and NOVOCAINE.  SOCIAL HISTORY:  She denies use of alcohol or tobacco products.  FAMILY HISTORY:  Noncontributory.  PHYSICAL EXAMINATION:  GENERAL:  She is an elderly white female in no acute distress. SKIN:  Warm and dry.  No jaundice. HEENT:  Eyes, her extraocular movements are intact.  Pupils are equal, round, and reactive to light.  Sclerae are nonicteric. LUNGS:  Clear bilaterally with no use of accessory respiratory muscles. HEART:  Regular rate and rhythm with an impulse in the left chest. ABDOMEN:  Soft.  She has got some mild diffuse tenderness, mild distention.  No guarding or peritonitis.  Her ostomy is pink with a small amount stool in the bag. EXTREMITIES:  No cyanosis, clubbing, or edema.  Good strength  in her arms and legs. PSYCHOLOGIC:  She is alert and oriented x3 with no evidence of anxiety or depression.  On review of her lab work, her white count is normal at 8800.  Her abdominal x-rays seem to be unchanged from what they were about a month or so ago with a nonspecific bowel gas pattern.  We will plan to admit her for IV hydration, bowel rest, NG tube, and monitor closely.     Ollen Gross. Vernell Morgans, M.D.     PST/MEDQ  D:  06/07/2010  T:  06/08/2010  Job:  [id number]  Electronically Signed by Chevis Pretty III M.D. on 06/09/2010 06:41:43 AM

## 2010-06-10 LAB — BASIC METABOLIC PANEL
CO2: 24 mEq/L (ref 19–32)
Calcium: 8.6 mg/dL (ref 8.4–10.5)
Creatinine, Ser: 0.74 mg/dL (ref 0.4–1.2)
GFR calc Af Amer: >60 mL/min (ref >60)
GFR calc non Af Amer: >60 mL/min (ref >60)
Sodium: 143 mEq/L (ref 135–145)

## 2010-06-10 LAB — DIFFERENTIAL
Basophils Absolute: 0 10*3/uL (ref 0.0–0.1)
Basophils Relative: 0 % (ref 0–1)
Eosinophils Absolute: 0.2 10*3/uL (ref 0.0–0.7)
Monocytes Absolute: 0.5 10*3/uL (ref 0.1–1.0)
Neutro Abs: 1.9 10*3/uL (ref 1.7–7.7)
Neutrophils Relative %: 45 % (ref 43–77)

## 2010-06-10 LAB — CBC
Hemoglobin: 10.5 g/dL — ABNORMAL LOW (ref 12.0–15.0)
MCHC: 32 g/dL (ref 30.0–36.0)
Platelets: 143 10*3/uL — ABNORMAL LOW (ref 150–400)

## 2010-06-11 ENCOUNTER — Other Ambulatory Visit: Payer: Self-pay | Admitting: *Deleted

## 2010-06-11 MED ORDER — DIGOXIN 125 MCG PO TABS: 125.0000 ug | ORAL_TABLET | Freq: Every day | ORAL | Status: DC

## 2010-06-11 NOTE — Telephone Encounter (Signed)
Refilled meds per fax request.  

## 2010-06-25 NOTE — H&P (Signed)
NAME:  Karen [name], Karen [name]               ACCOUNT NO.:  [id number]   MEDICAL RECORD NO.:  [id number]           PATIENT TYPE:   LOCATION:                                 FACILITY:   PHYSICIAN:  Dooley L. Underwood, P.A.DATE OF BIRTH:  [birth year]   DATE OF ADMISSION:  07/02/2006  DATE OF DISCHARGE:                              HISTORY & PHYSICAL   CHIEF COMPLAINT:  Pain in my left hip.   PRESENT ILLNESS:  This 77 year old white female was seen by Dr.  Simonne Come primarily for a second opinion concerning left hip pain and  left leg pain.  She has had progressive problems concerning this hip  with the pain in the groin with radiation in the lower left lower  extremity to her foot.  She has had evaluation by another orthopedist as  well as a Midwife.  MRI, as well as routine x-rays, have shown  advanced left hip osteoarthritis.  After much consideration, including  the risks and benefits of surgery, it was decided to go ahead with total  hip replacement arthroplasty of the left hip.  Dr. Cassell Clement has  cleared her as far as her cardiology status is concerned for this  surgical procedure.   Recently, she had been diagnosed with diabetes by Dr. Neita Carp.  All  questions, including the risks and benefits of surgery, have been  discussed with the patient and her husband by myself as well as Dr.  Simonne Come.   PAST MEDICAL HISTORY:  The patient is being treated for coronary artery  disease, hypertension, diabetes (diet), hypothyroidism, fibrocystic  breast disease.   CURRENT MEDICATIONS:  1. Coreg 0.25 mg 1 b.i.d..  2. Potassium 10 mEq caps 1 b.i.d..  3. Levoxyl 50 mcg tablet 1 daily.  4. Nexium 40 mg tab 1 daily.  5. Triamterene/HCTZ 75/50 tabs 1/2 tablet daily.  6. Ecotrin 81 mg tab daily (will stop prior to surgery).  7. Ranitidine 150 mg 1/2 tablet at bedtime.  8. __________ 5 mg 1/2 tablet daily.  9. Amitriptyline hydrochloride 50 mg tab at bedtime.  10.Alprazolam 0.5 mg tab  1/2 tablet at bedtime.  11.Diovan tab 160 mg tab daily.  12.Celebrex 200 mg tabs daily.  13.Hydrocodone for discomfort.   PAST SURGERIES AND PROCEDURES:  1. Colonoscopy 06/15/2001.  2. Heart catheterization October 2002.  3. Right hand carpal tunnel release 2002.  4. Left hand unknown date.  5. Right breast biopsy in 2000.  6. Right knee scope in 1998.  7. Repair of the rotator cuff with subacromial decompression 1991.  8. Laparoscopic cholecystectomy in 1993.  9. Left hemicolectomy for adenocarcinoma of the colon 04/12/1987 as      well as removal of cyst from the liver.  10.Heart catheterization August 1987.  11.Colonoscopy for benign polyps 05/04/1984.  12.Bilateral vein stripping in 1967.  13.Right hysterectomy 1996.  14.Removal of tumor embedded in back 1966.  15.Vaginal hysterectomies and oophorectomy 1962.  16.Left ear surgery 1957.  17.Partial removal of right ovary 1955.   ALLERGIES:  No allergies to medications, but she is very sensitive to  analgesics and other sedative-type  medications.  She cannot take  Vicodin.   SOCIAL HISTORY:  The patient is married, retired, no intake of alcohol  or tobacco products.  She has four children.  They live in a one-story  house.   FAMILY HISTORY:  Positive for heart disease, diabetes, cancer, stroke  and arthritis.   REVIEW OF SYSTEMS:  CNS: No seizure disorder, paralysis or double  vision.  RESPIRATORY: No productive cough.  However, the patient does  have an intermittent bronchitic-type symptomatology which she says  clears in a few days.  Her last episode of bronchitis was in January of  this year.  CARDIOVASCULAR:  No chest pain, no angina or orthopnea.  GASTROINTESTINAL:  No nausea, vomiting, melena or bloody stool.  GENITOURINARY:  No discharge, dysuria or hematuria.  MUSCULOSKELETAL:  Primarily in present illness of the left hip.   PHYSICAL EXAMINATION:  GENERAL:  Alert, cooperative and friendly,  somewhat obese,  77 year old white female who is accompanied by her  husband.  VITAL SIGNS:  Blood pressure 160/80 seated, pulse 80 regular,  respirations 12 unlabored.  HEENT: Normocephalic, wears glasses.  Pupils  are equal and reactive to light.  However, she does have lax ocular  muscle control particularly in the right eye.  Oropharynx is clear.  CHEST:  Clear to auscultation.  No rhonchi or rales.  HEART:  Regular rate and rhythm.  No murmurs are heard.  ABDOMEN:  Obese, soft, nontender.  Liver and spleen not felt.  GENITALIA:  Rectal not done, not pertinent to present illness.  EXTREMITIES:  Good circulation in the left lower extremity; however, she  has painful range of motion any parameters and extreme difficulty coming  to a standing position secondary to her left hip pain.   ADMISSION DIAGNOSES:  1. Severe end-stage osteoarthritis of left hip.  2. Coronary artery disease.  3. Hypertension.  4. Hypothyroidism.  5. Fibrocystic breast disease.  6. Recently diagnosed diabetes.   PLAN:  The patient has been admitted for left total hip replacement  arthroplasty assisted by Dr. Darrelyn Hillock.  Will see how she does with her in-  house physical therapy.  She may need to have rehabilitation after  regular hospitalization.  We will certainly see at that time.  Should  she have any medical problems, we will certainly contact one of the  hospitalists, and if any cardiology situation arise, will contact Dr.  Ronny Flurry.      Dooley L. Cherlynn June.     DLU/MEDQ  D:  06/23/2006  T:  06/23/2006  Job:  [id number]   cc:   Marlowe Kays, M.D.  Fax: [phone]   Fara Chute  Fax: [phone]   Cassell Clement, M.D.  Fax: [phone]

## 2010-06-25 NOTE — Discharge Summary (Signed)
NAME:  Karen [name], Karen [name]               ACCOUNT NO.:  [id number]   MEDICAL RECORD NO.:  [id number]          PATIENT TYPE:  INP   LOCATION:  1620                         FACILITY:  [facility]   PHYSICIAN:  Marlowe Kays, M.D.  DATE OF BIRTH:  Aug 13, 1929   DATE OF ADMISSION:  07/02/2006  DATE OF DISCHARGE:  07/07/2006                               DISCHARGE SUMMARY   ADMISSION DIAGNOSES:  1. Severe end-stage osteoarthritis of left hip.  2. Coronary artery disease.  3. Hypertension.  4. Hypothyroidism.  5. Fibrocystic breast disease.  6. Recently diagnosed diabetes.   DISCHARGE DIAGNOSES:  1. Severe end-stage osteoarthritis of left hip.  2. Coronary artery disease.  3. Hypertension.  4. Hypothyroidism.  5. Fibrocystic breast disease.  6. Recently diagnosed diabetes.  7. Postoperative anemia treated with transfusion.   OPERATION:  On Jul 02, 2006 the patient underwent [employer] total hip  replacement arthroplasty of left hip, press fit femoral stem and cup.  Ronald A. Gioffre, M.D. assisted.   CONSULTATIONS:  None.   BRIEF HISTORY:  This 77 year old white female was seen by [place] for  primarily second opinion concerning left hip and leg pain.  She had  previous evaluation by another orthopedist as well as a Midwife.  X-  ray, routine x-rays and MRI showed advanced left hip arthritis.  The  patient chose to be treated by [place] and the groin and leg pain was  primarily associated with osteoarthritis of the left hip versus lumbar  radiculitis and total hip replacement arthroplasty was discussed.  After  risks and benefits of surgery had been explained to the patient and her  family, it was decided to go ahead with the above procedure.   HOSPITAL COURSE:  The patient tolerated the surgical procedure quite  well.  She did have a drop in hemoglobin postoperatively to 7.4.  She  was transfused with packed red blood cells bringing her hemoglobin back  up to 9.2.  She was placed on iron  postoperatively.   She was very slow with her physical therapy and it is felt she would  benefit from her rehabilitation program versus going directly home with  home health.  Search was undertaken utilizing skills of Reece Levy,  social worker and her family.  T[facility]atively the [facility] was chosen  and she can continue with her physical therapy and total hip protocol as  well as precautions there.   Therapy was limited to touch down weightbearing to the opposite  extremity on the left due to the fact that it was press-fit and not  cemented.  Press-fit was chosen by Dr. Simonne Come due to her good bone  stock.  Stability with ingrowth of bone into the prosthesis is much  better than cemented and that was chosen for long life on this hip.   Wound was dry.  Neurovascular intact to opposite extremity.  Patient was  looking forward to her continuing rehabilitation.   LABORATORY DATA:  Hematologically showed preoperative CBC with a mild  anemia, hemoglobin was 11.8, hematocrit was 35.1.  Final hemoglobin she  was 9.2 with hematocrit of 27.1.  Blood chemistries were essentially  normal.  Glucose was 92.  Urinalysis negative for urinary tract  infection.  GFR was 58.  Chest x-ray showed mild cardiomegaly and mild  __________  with chronic bronchitis.  Postoperative hip x-ray showed no  radiographic evidence of complications.  Electrocardiogram showed normal  sinus rhythm with left bundle branch block.   CONDITION ON DISCHARGE:  Improved stable.   PLAN:  Patient discharged to skilled nursing facilities, continue with  touch down weightbearing on the left lower extremity.  Use dry dressings  to the hip on an as needed basis.   CURRENT MEDICATIONS:  Coumadin protocol which will continue for four  weeks after the date of surgery, maintaining the INR around 2 by dosing  by pharmacy utilizing Coumadin protocol.  Colace cap twice daily or  daily.  Coreg 25 mg twice daily.  Zocor 10 mg at 6  p.m.  Xanax 0.25 mg  bedtime.  Protonix 40 mg EZ tab daily w. c. Elavil 50 mg at bedtime.  Pepcid 10 mg at bedtime.  Triamterene hydrochlorothiazide 37.5/25 mg  daily.  Avapro 300 mg tab daily, Synthroid Levothroid 50 mcg tab daily.  Celebrex 20 mg tab daily.  Maalox as needed.  Darvocet 1 to 2 every four  hours as needed for pain.  Robaxin 500 mg 1 by mouth every six hours as  needed for muscle spasm.   She is to follow-up with Dr. Simonne Come at 2 to 2-1/2 weeks after date of  surgery.  His number is [phone].  Staples may be removed ast 2-1/2  weeks after date of surgery using Steri-Strips as needed.  For any  medical problems recommend you call Fara Chute, MD and any cardiology  problems or questions, call Cassell Clement, M.D.      Dooley L. Cherlynn June.    ______________________________  Marlowe Kays, M.D.    DLU/MEDQ  D:  07/07/2006  T:  07/07/2006  Job:  [id number]   cc:   Fara Chute  Fax: [phone]   Cassell Clement, M.D.  Fax: [phone]

## 2010-06-25 NOTE — Op Note (Signed)
[name], [name]               ACCOUNT NO.:  [id number]   MEDICAL RECORD NO.:  [id number]          PATIENT TYPE:  INP   LOCATION:  2116                         FACILITY:  [facility]   PHYSICIAN:  Adolph Pollack, M.D.DATE OF BIRTH:  [birth year]   DATE OF PROCEDURE:  08/21/2008  DATE OF DISCHARGE:                               OPERATIVE REPORT   PREOPERATIVE DIAGNOSIS:  Sepsis with anastomotic leak.   POSTOPERATIVE DIAGNOSES:  1. Sepsis with small bowel leak x2.  2. Anastomotic ischemia.   PROCEDURES:  1. Exploratory laparotomy.  2. Small bowel resection.  3. Resection of ileocolonic anastomosis.  4. Ileostomy.   SURGEON:  Adolph Pollack, MD   ASSISTANT:  Amber L. Freida Busman, MD   ANESTHESIA:  General.   INDICATIONS:  Karen [name] is a 77 year old female who is postop day #5  from a right colectomy for colon cancer.  She began becoming ill and  anuric over the weekend and then was transferred to [facility] today  with acute renal failure and sepsis.  CT scan demonstrated the enteral  contrast to be in the peritoneal cavity and it was felt that she had an  anastomotic leak.  She is now being brought to the operating room for  emergency exploratory laparotomy.   TECHNIQUE:  She was brought to the operating room, intubated, and placed  on the operating table.  General anesthesia was administered.  The  abdominal area was sterilely prepped and draped.   The staples were removed.  Opening up the subcutaneous tissue, there was  a large rush of air and succus material.  I evacuated this by way of  suction.  There was succus staining on the subcutaneous tissue.  I then  identified a Prolene type suture and removed this in the anterior layer  and I removed the posterior layer Prolene sutures as well.  Large amount  of succus contamination was encountered.  This was evacuated by way of  the suction.  There was significant staining on the all intra-abdominal  organs in the  peritoneal cavity.  Subsequently, I approached the midline  area.  I then identified what appeared to be the anastomosis.  Upon  inspection, there was an area at the anastomosis that was paper thin and  ischemic.  This was not consistent with the leak but was consistent with  potential anastomotic failure in the near future.   I began to free up some of the small bowel and drained the interloop  contamination.  I then noted 2 holes in the small bowel in the left  upper quadrant region very close to one another and not far from the  anastomosis.  These were leaking the succus material.  I mobilized these  areas of small bowel by lysing adhesions sharply between the abdominal  wall and from small bowel to small bowel.  Once I had this area up into  the wound, I then divided the small bowel at relatively normal areas to  include both the enterotomies and a segment of resection.  I then  divided the mesentery with the LigaSure and  handed the specimen off the  field.  The distal portion of the small bowel appeared to be a bit  ischemic, so I removed some more of it until it was pink.   I then created a side-to-side anastomosis between the 2 loops of small  bowel using a GIA stapler.  The common defect was closed with a linear  noncutting stapler.  The anastomosis was patent, viable under no  tension.  A suture of 3-0 Vicryl was placed at the distal staple line.  The mesenteric defect was closed with interrupted 3-0 Vicryl sutures.   Following this, I then approached the anastomosis and noted with minimal  manipulation, the area of ischemia was easily disrupted.  I subsequently  divided the small bowel just proximal to the anastomosis and the colon  just distal to the anastomosis, then divided the mesentery with LigaSure  and resected the anastomosis and handed it off the field.  I left a long  Hartmann limb.  I mobilized the ileum by dividing the sutures  approximating the mesenteric  defect.   Following this, the abdominal cavity was irrigated out copiously with  saline solution.  All quadrants were irrigated and fluid evacuated until  it was clear.   I then chose a point in the right lower quadrant adjacent to the  umbilicus.  This was the best point available to me given the extended  right subcostal incision that was used.  I made a circular incision in  the skin and subcutaneous tissue.  A cruciate incision was then made in  the anterior rectus sheath and posterior rectus sheath.  I brought up  the distal ileus stump through a very thick abdominal wall and went to  the mesentery.  I brought up enough to potentially perform a Brooke  ileostomy.   Following this, I then closed the fascia in 1 layer with a running #1  looped PDS suture.  Subcutaneous tissue was then packed with sterile  gauze.  The ileostomy was somewhat ischemic at its stump, so I resected  part of this and then matured the ileostomy with interrupted 3-0 Vicryl  sutures.  It was somewhat pale, but given of the distance between the  abdominal cavity and the skin, I had no choice but to accept this.   A stoma appliance was then applied, and a dry dressing was placed over  the wound.   She tolerated the procedure without any apparent complications and was  taken back to the recovery room in critical condition.  Findings were  discussed with her family.      Adolph Pollack, M.D.  Electronically Signed     TJR/MEDQ  D:  08/22/2008  T:  08/22/2008  Job:  [id number]   cc:   Sherilyn Cooter A. Cleotis Nipper, M.D.

## 2010-06-25 NOTE — Consult Note (Signed)
[name], [name]               ACCOUNT NO.:  [id number]   MEDICAL RECORD NO.:  [id number]          PATIENT TYPE:  INP   LOCATION:  2116                         FACILITY:  [facility]   PHYSICIAN:  Adolph Pollack, M.D.DATE OF BIRTH:  10-Sep-1929   DATE OF CONSULTATION:  08/21/2008  DATE OF DISCHARGE:                                 CONSULTATION   REASON FOR CONSULT:  Anastomotic leak, sepsis, and acute renal failure.   HISTORY:  This is a 77 year old female who underwent a right colectomy  for adenocarcinoma of the colon 5 days ago.  Subsequently, she became  acutely ill with acute renal failure and septic shock and was  transferred to [facility] for further evaluation.  She was placed on  vasopressors and underwent abdominal CT scan with oral contrast, which  demonstrated free contrast in the peritoneal cavity and evidence around  the anastomosis of an anastomotic leak.  I was asked to see her at that  time.  She is currently intubated and thus I cannot get a history from  her, thus it comes from notes that are on the chart.   PAST MEDICAL HISTORY:  1. Left colon cancer.  2. Hypertension.  3. Dyslipidemia.  4. Bladder cancer.  5. Coronary artery disease.  6. Gastroesophageal reflux disease.  7. Hypothyroidism.  8. Degenerative joint disease.   PAST SURGICAL HISTORY:  Previous operations per chart include left  hemicolectomy, left total hip replacement, TAH/BSO, TURBT.   CURRENT MEDICATIONS:  Include Levophed, phenylephrine, Solu-Cortef,  Zosyn, amiodarone, as well as sedation protocol.   SOCIAL HISTORY:  Unobtainable.   REVIEW OF SYSTEMS:  Unobtainable.   PHYSICAL EXAMINATION:  GENERAL:  Ill-appearing female, sedated on the  ventilator.  Pulse rate is in the low 100s, blood pressure is 103  systolic.  CARDIOVASCULAR:  Increased rate.  RESPIRATORY:  Breath sounds are equal and clear.  ABDOMEN:  Soft with a fresh extended right subcostal incision with  staples and no  purulent drainage present.  The abdomen is somewhat  distended.   LABORATORY DATA:  White cell count 11,600, hemoglobin is 8.5.  BUN 58,  creatinine 5.1, potassium 5.4, sodium 131, lactate 2.3, albumin 2.1.   CT scan was reviewed.   IMPRESSION:  Septic shock and acute renal failure secondary to  anastomotic leak.   PLAN:  To the operating room for emergency exploratory laparotomy,  likely ileostomy.  I have discussed the procedure and the risks with her  husband.  I told there is a chance she may not survive the operation.  He seems to understand this and agrees to proceed.      Adolph Pollack, M.D.  Electronically Signed     TJR/MEDQ  D:  08/21/2008  T:  08/22/2008  Job:  [id number]

## 2010-06-25 NOTE — Consult Note (Signed)
NAME:  Karen [name], Karen [name]               ACCOUNT NO.:  [id number]   MEDICAL RECORD NO.:  [id number]          PATIENT TYPE:  INP   LOCATION:  2116                         FACILITY:  [facility]   PHYSICIAN:  Aram Beecham B. Eliott Nine, M.D.DATE OF BIRTH:  [birth year]   DATE OF CONSULTATION:  08/21/2008  DATE OF DISCHARGE:                                 CONSULTATION   HISTORY OF PRESENT ILLNESS:  We are asked to see this patient because of  acute renal failure, hyperkalemia and metabolic acidosis.  She is a 77-  year-old female who was transferred from [facility] to the  critical care service today because of renal failure, hypotension and  respiratory failure.  She has a recent history of poorly differentiated  adenocarcinoma of the ascending colon and is status post a right  hemicolectomy on August 16, 2008, with primary anastomosis of the ileum to  the transverse colon.  Her preoperative creatinine on August 16, 2008 was  0.95.  On 07/08 was 1.86, 07/10 4.09 and 07/11, 4.49.  Today creatinine  was up to 5.1 with a venous CO2 of 14 and a potassium of 5.8.   Over this past weekend, she had hypotension into the 80s to 90s.  She  was receiving q. 6-hour Toradol which was discontinued on the 11th.  She  had a drop in hemoglobin from 11.3 preop to 8.6.  In addition, she was  on Diovan therapy along with a diuretic as an outpatient prior to her  surgery. She had no exposure to contrast that I could locate in the  records which were sent with her.   Her workup to [facility] was limited to an ultrasound which showed 9.8 and  9.7 cm kidneys right and left respectively, and on July 10, she had a  urinalysis which showed greater than 300 mg percent protein, but was a  very concentrated specimen with a specific gravity of 1.030.  She also  had large bacteria on that specimen.   She had other issues while she was at [facility] including shortness of  breath with hypoxemia and a significant AA gradient. On arrival to  the  [facility] ICU she required urgent intubation for respiratory failure and was  noted as well to be in atrial fib with a rapid ventricular response.   PAST MEDICAL HISTORY:  1. Hypertension.  2. Dyslipidemia.  3. History of TURB for bladder cancer.  4. Coronary artery disease with a left bundle branch block (chronic)      followed by Dr. Patty Sermons.  5. GERD.  6. Hypothyroidism, on replacement.  7. History of TAH-BSO.  8. Left total hip replacement (2008).  9. Prior left hemicolectomy for cancer in 1989, recent second      hemicolectomy August 16, 2008.  10.History of carpal tunnel syndrome.  11.According to the note, she was also started on Septra one day prior      to admission because of some sort of bronchial infection.   MEDICATIONS:  Her medications prior to transfer included:  1. Levaquin.  2. Zosyn.  3. Reglan.  4. Protonix.  5. Toradol (which  was stopped on the 11th).  6. Synthroid 44 mcg IV daily.  7. Half normal saline with 1 ampule of bicarb/liter.  8. Lasix drip.  9. Lovenox.  10.Kayexalate was ordered but does not appear to have been given.   Preoperatively, as an outpatient, she was receiving:  1. Carvedilol 25 mg b.i.d.  2. Maxzide 25 once a day.  3. Levothyroxine 88 mcg a day.  4. Aspirin.  5. Amitriptyline 50 mg a day.  6. Diovan 160 mg a day.  7. Xanax 0.5 mg as needed.  8. Nexium 40 mg a day.  9. Vicodin 7.5/750 as needed.  10.Iron and potassium supplementation.   Current medications pending her admission orders right now include only  Levophed and Zosyn, and meds she is receiving therapy through the sepsis  protocol.   FAMILY HISTORY:  Noncontributory.   SOCIAL HISTORY:  No alcohol.  No tobacco.   REVIEW OF SYSTEMS:  Unobtainable as the patient has just been intubated.   PHYSICAL EXAMINATION:  VITAL SIGNS:  Blood pressure on pressor support  is 120/70, heart rate is 150, A fib versus SVT.  HEENT:  Her eyes are open and she appears to be following  some commands.  She will squeeze the respiratory therapist's hand on the left, but I  cannot get her to squeeze on the right.  She is a corpulent pale white  female.  She has a fresh right IJ hemodialysis catheter which was just  placed.  LUNGS:  Grossly clear anteriorly.  ABDOMEN:  Reveals a large new right semicircular incision with some  bruising and some peau d'orange look to the right lower abdomen.  She  has a healed left semicircular old hemicolectomy scar.  Abdomen is  somewhat distended, but I cannot elicit any focal tenderness.  She has  2+ edema of both lower extremities.  There is bruising on the left arm,  and she has scarring from a left carpal tunnel surgery done in the past.  Foley catheter has 2 mL of muddy brown urine in the tubing.   LABORATORY DATA:  All [place] laboratory and chest x-rays are pending.   From [facility] today, her creatinine was 5, CO2 14, potassium  5.8, anion gap 15, hemoglobin 8.6 down from 11.3 preoperatively and  blood gas on 6 liters of oxygen at [facility] pH 7.26, pO2 67,  pCO2 of 34.  Chest x-ray and CT the abdomen are both pending.  Renal ultrasound from [facility] showed right and left kidneys  measuring 9.8 and 9.7 cm respectively with no evidence for  hydronephrosis.   IMPRESSION:  A 77 year old woman who is status post hemicolectomy for  colon cancer who has developed oligoanuric acute renal failure in the  setting of hypotension, acute blood loss anemia, nonsteroidal anti-  inflammatory drug administration and preoperative therapy with  angiotensin receptor blockers.  She also now has rapid atrial  fibrillation contributing to hypotension with the need for pressors.  She has developed a metabolic acidosis with hyperkalemia, and she is  essentially anuric.  Because she is requiring pressor support for  hypotension, she will require CVVHD opposed to  conventional  hemodialysis for correction of her electrolyte  abnormalities.  Pending  CVP and chest x-ray data, will keep volume even though my sense is that  she is volume overloaded.   I note on outside urinalysis that she had greater than 300 mg percent  protein, although this was a concentrated specimen.  Will need  to  recheck a urinalysis if she makes any urine and check a protein  creatinine ratio.  If there is significant proteinuria, then she will  require additional workup for this, but as far as her current renal  failure goes most of this picture is acute particularly since her  preoperative creatinine was 0.95.   Thank you for asking [place] to see this lady.  Will initiate CVVHD and  follow closely with you.      Duke Salvia Eliott Nine, M.D.  Electronically Signed     CBD/MEDQ  D:  08/21/2008  T:  08/21/2008  Job:  [id number]

## 2010-06-25 NOTE — Op Note (Signed)
NAME:  TARAH, BUBOLTZ               ACCOUNT NO.:  [id number]   MEDICAL RECORD NO.:  [id number]          PATIENT TYPE:  INP   LOCATION:  2030                         FACILITY:  [facility]   PHYSICIAN:  Cherylynn Ridges, M.D.    DATE OF BIRTH:  10-07-1929   DATE OF PROCEDURE:  09/06/2008  DATE OF DISCHARGE:                               OPERATIVE REPORT   PREOPERATIVE DIAGNOSIS:  Open abdomen with enteric drainage, bilious  drainage in necrotic fashion.   POSTOPERATIVE DIAGNOSIS:  Open abdomen with enteric drainage, bilious  drainage in necrotic fashion with enterotomy at the site of her previous  small bowel anastomosis.   PROCEDURE:  Irrigation and debridement of necrotic abdominal fascia,  exploratory laparotomy, repair of small bowel enterotomy or  enterorrhaphy, and VAC dressing.   SURGEON:  Marta Lamas. Lindie Spruce, MD   ASSISTANT:  Brayton El, PA-C   ANESTHESIA:  General endotracheal.   ESTIMATED BLOOD LOSS:  100 mL.   COMPLICATIONS:  None.   CONDITION:  Stable.  She had a VAC dressing in place with Mepitel  protecting the bowel.   INDICATIONS FOR OPERATION:  The patient is an unfortunate 77 year old  who had undergone a right colectomy with the subsequent development of  small necrosis of the anastomosis, a leak.  She was re-explored about a  week to 10 days after original operation, found to have exposed bowel  with leakage.  Bowel resection was performed along with new ileostomy  and now 2 weeks after that operation, she has an open abdomen with  necrotic fascia, gross drainage of enteric contents in bowel.  So, she  was taken back to the operating room for exploration and treatment.   OPERATION:  The patient was taken to the operating room, placed on table  in supine position.  After an adequate general endotracheal anesthetic  was administered, she was prepped and draped in usual sterile manner.  Her stoma bag was taken off.  A 1000 drape was used to protect that.  She was  sprayed in the midline wound and then painted with Betadine.   The initial portion of the procedure, there has been debriding necrotic  fascia, subcutaneous tissue, and removing of suture material that have  been placed at the last operation.  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   This small bowel opening was repaired using interrupted 2-0 silk  sutures.  However, the chances of this healing without leak again seems  unlikely because of a gross amount of contamination.  We did place a 19-  mm Blake drain in the left upper quadrant, brought out the lateral  aspect of the left abdominal wall and secured it with 3-0 nylon.  This  was placed on top of the piece of Mepitel that was placed on the bowel  itself completely across the open abdomen.   We debrided a large amount of necrotic subcutaneous tissue and tissue in  the anterior abdominal wall and the muscle fascia on the right side.  This was sharply and bluntly dissected free and washed out with a pulse  lavage.   We did enter to peritoneal cavity completely because of her frozen  bowel.  There was no complete evisceration.  We removed a large amount  of double-stranded looped PDS, which had pulled through necrotic fascia.  We irrigated everything with pulse lavage and with saline solution.  Once we put down the Mepitel, we placed the Gilbert drain on top that can  completely across the wound, then we put on abdominal VAC dressing using  an inner double-layered plastic black dressing on the inner part, then  two foam dressings to conform to the wound on top, seemed to be an  excellent seal once we got it completely on place.  The bulb of the  Blake drain in left upper quadrant did appear to lose discharge;  however, may be maintained once the [facility] is completely functional.  The   patient was taken to the recovery room in stable condition.      Cherylynn Ridges, M.D.  Electronically Signed     JOW/MEDQ  D:  09/06/2008  T:  09/07/2008  Job:  [id number]   cc:   Adolph Pollack, M.D.  Henry A. Cleotis Nipper, M.D.

## 2010-06-25 NOTE — Op Note (Signed)
[name], [name]               ACCOUNT NO.:  [id number]   MEDICAL RECORD NO.:  [id number]          PATIENT TYPE:  INP   LOCATION:  2109                         FACILITY:  [facility]   PHYSICIAN:  Lennie Muckle, MD      DATE OF BIRTH:  Jul 15, 1929   DATE OF PROCEDURE:  DATE OF DISCHARGE:                               OPERATIVE REPORT   PREOPERATIVE DIAGNOSIS:  Open abdomen.   POSTOPERATIVE DIAGNOSIS:  Open abdomen.   PROCEDURE:  Closure of abdomen, placement of an abdominal drain.   FINDINGS:  Poor fascia, small continued leak from the small intestinal  anastomosis, the abdomen was closed with retention sutures as well as a  looped PDS.  Minimal blood loss.  Tolerated procedure well.   INDICATIONS FOR PROCEDURE:  Karen [name] is a 77 year old female who has  had a colectomy, was taken to the operating room for anastomotic leak  and enterotomy. Subsequently, developed increasing abdominal pain and  was noted to have leaking from anastomosis site.  This is reinforced  last week by Dr. Elmon Kirschner.  She comes today for wound VAC and possible  abdominal closure.   DETAILS OF PROCEDURE:  Karen [name] was identified in the preoperative  holding area.  She had already been on vancomycin.  She was taken to the  operating room and placed under general endotracheal anesthesia.  Abdomen was prepped.  The wound VAC was removed, prepped the abdomen  with Betadine including the ostomy site.  Irrigated the abdomen, removed  the gauze.  There appeared to be still a small amount of bilious leakage  from the anastomosis.  Tissues were very friable.  There was no further  evidence of infection.  I removed the packing on the right side of the  abdomen, irrigated the abdomen with 2 liters of saline.  I then closed  the fascial defect of the umbilicus with #1 PDS suture in a running  fashion.  At some point, the fascia was very poor, therefore intervening  the retention sutures and Ethibond suture.  The wound  was packed with a  moist gauze.  She was transported to the unit intubated, hopefully being  weaned to extubate, and will continue wound management.      Lennie Muckle, MD  Electronically Signed     ALA/MEDQ  D:  09/11/2008  T:  09/12/2008  [name]:  [phone]

## 2010-06-25 NOTE — H&P (Signed)
NAME:  Karen [name], Karen [name]               ACCOUNT NO.:  [id number]   MEDICAL RECORD NO.:  [id number]           PATIENT TYPE:   LOCATION:                                 FACILITY:   PHYSICIAN:  Marlowe Kays, M.D.  DATE OF BIRTH:  Sep 09, 1929   DATE OF ADMISSION:  07/02/2006  DATE OF DISCHARGE:                              HISTORY & PHYSICAL   CHIEF COMPLAINT:  Pain in my left hip.   PRESENT ILLNESS:  This 77 year old white female was seen by Dr.  Simonne Come primarily for a second opinion concerning left hip pain and  left leg pain.  She has had progressive problems concerning this hip  with the pain in the groin with radiation in the lower left lower  extremity to her foot.  She has had evaluation by another orthopedist as  well as a Midwife.  MRI, as well as routine x-rays, have shown  advanced left hip osteoarthritis.  After much consideration, including  the risks and benefits of surgery, it was decided to go ahead with total  hip replacement arthroplasty of the left hip.  Dr. Cassell Clement has  cleared her as far as her cardiology status is concerned for this  surgical procedure.   Recently, she had been diagnosed with diabetes by Dr. Neita Carp.  All  questions, including the risks and benefits of surgery, have been  discussed with the patient and her husband by myself as well as Dr.  Simonne Come.   PAST MEDICAL HISTORY:  The patient is being treated for coronary artery  disease, hypertension, diabetes (diet), hypothyroidism, fibrocystic  breast disease.   CURRENT MEDICATIONS:  1. Coreg 0.25 mg 1 b.i.d..  2. Potassium 10 mEq caps 1 b.i.d..  3. Levoxyl 50 mcg tablet 1 daily.  4. Nexium 40 mg tab 1 daily.  5. Triamterene/HCTZ 75/50 tabs 1/2 tablet daily.  6. Ecotrin 81 mg tab daily (will stop prior to surgery).  7. Ranitidine 150 mg 1/2 tablet at bedtime.  8. __________ 5 mg 1/2 tablet daily.  9. Amitriptyline hydrochloride 50 mg tab at bedtime.  10.Alprazolam 0.5 mg tab  1/2 tablet at bedtime.  11.Diovan tab 160 mg tab daily.  12.Celebrex 200 mg tabs daily.  13.Hydrocodone for discomfort.   PAST SURGERIES AND PROCEDURES:  1. Colonoscopy 06/15/2001.  2. Heart catheterization October 2002.  3. Right hand carpal tunnel release 2002.  4. Left hand unknown date.  5. Right breast biopsy in 2000.  6. Right knee scope in 1998.  7. Repair of the rotator cuff with subacromial decompression 1991.  8. Laparoscopic cholecystectomy in 1993.  9. Left hemicolectomy for adenocarcinoma of the colon 04/12/1987 as      well as removal of cyst from the liver.  10.Heart catheterization August 1987.  11.Colonoscopy for benign polyps 05/04/1984.  12.Bilateral vein stripping in 1967.  13.Right hysterectomy 1996.  14.Removal of tumor embedded in back 1966.  15.Vaginal hysterectomies and oophorectomy 1962.  16.Left ear surgery 1957.  17.Partial removal of right ovary 1955.   ALLERGIES:  No allergies to medications, but she is very sensitive to  analgesics and other  sedative-type medications.  She cannot take  Vicodin.   SOCIAL HISTORY:  The patient is married, retired, no intake of alcohol  or tobacco products.  She has four children.  They live in a one-story  house.   FAMILY HISTORY:  Positive for heart disease, diabetes, cancer, stroke  and arthritis.   REVIEW OF SYSTEMS:  CNS: No seizure disorder, paralysis or double  vision.  RESPIRATORY: No productive cough.  However, the patient does  have an intermittent bronchitic-type symptomatology which she says  clears in a few days.  Her last episode of bronchitis was in January of  this year.  CARDIOVASCULAR:  No chest pain, no angina or orthopnea.  GASTROINTESTINAL:  No nausea, vomiting, melena or bloody stool.  GENITOURINARY:  No discharge, dysuria or hematuria.  MUSCULOSKELETAL:  Primarily in present illness of the left hip.   PHYSICAL EXAMINATION:  GENERAL:  Alert, cooperative and friendly,  somewhat obese,  77 year old white female who is accompanied by her  husband.  VITAL SIGNS:  Blood pressure 160/80 seated, pulse 80 regular,  respirations 12 unlabored.  HEENT: Normocephalic, wears glasses.  Pupils  are equal and reactive to light.  However, she does have lax ocular  muscle control particularly in the right eye.  Oropharynx is clear.  CHEST:  Clear to auscultation.  No rhonchi or rales.  HEART:  Regular rate and rhythm.  No murmurs are heard.  ABDOMEN:  Obese, soft, nontender.  Liver and spleen not felt.  GENITALIA:  Rectal not done, not pertinent to present illness.  EXTREMITIES:  Good circulation in the left lower extremity; however, she  has painful range of motion any parameters and extreme difficulty coming  to a standing position secondary to her left hip pain.   ADMISSION DIAGNOSES:  1. Severe end-stage osteoarthritis of left hip.  2. Coronary artery disease.  3. Hypertension.  4. Hypothyroidism.  5. Fibrocystic breast disease.  6. Recently diagnosed diabetes.   PLAN:  The patient has been admitted for left total hip replacement  arthroplasty assisted by Dr. Darrelyn Hillock.  Will see how she does with her in-  house physical therapy.  She may need to have rehabilitation after  regular hospitalization.  We will certainly see at that time.  Should  she have any medical problems, we will certainly contact one of the  hospitalists, and if any cardiology situation arise, will contact Dr.  Ronny Flurry.      Dooley L. Cherlynn June.    ______________________________  Marlowe Kays, M.D.    DLU/MEDQ  D:  06/23/2006  T:  06/23/2006  Job:  [id number]   cc:   Marlowe Kays, M.D.  Fax: [phone]   Fara Chute  Fax: [phone]   Cassell Clement, M.D.  Fax: [phone]

## 2010-06-25 NOTE — Op Note (Signed)
NAME:  [name], Karen               ACCOUNT NO.:  [id number]   MEDICAL RECORD NO.:  [id number]          PATIENT TYPE:  AMB   LOCATION:  ENDO                         FACILITY:  [facility]   PHYSICIAN:  Graylin Shiver, M.D.   DATE OF BIRTH:  1929-08-22   DATE OF PROCEDURE:  05/05/2007  DATE OF DISCHARGE:                               OPERATIVE REPORT   PROCEDURE:  Esophagogastroduodenoscopy with biopsy and Savory dilatation  of an esophageal stenosis.   INDICATION:  A 77 year old female with dysphagia, a barium swallow  showed a stricture in the mid esophagus region.  Endoscopy with  dilatation is being done.   Informed consent was obtained after explanation of the risks of  bleeding, infection and perforation.   PREMEDICATION:  Fentanyl 75 mcg IV, Versed 7 mg IV.   PROCEDURE:  With the patient in the left lateral decubitus position, the  Pentax gastroscope was inserted into the oropharynx and passed into the  esophagus.  It was advanced down the esophagus then into the stomach and  into the duodenum.  The second portion and bulb of the duodenum were  normal.  The stomach showed a 1 cm mid body polyp on the greater  curvature.  This was benign-appearing and was biopsied.  There was a  tiny polyp adjacent to it.  The fundus and cardia of the stomach and the  rest of the stomach looked unremarkable.  The esophagus was inspected.  The esophagogastric junction area was normal.  The distal and mid  esophagus appeared normal but up in the region of the mid to proximal  esophagus area there did appear to be a narrowing which was difficult to  photograph because it kept spasming and closing down very quickly, I was  never able to document with a photograph.  I passed the scope through  this area without difficulty but it kept closing down and this I believe  is in the region where the x-ray showed the narrowing.  I advanced the  scope back down into the stomach and placed a guidewire.  I then  ran a  12 mm as Savary dilator under fluoroscopy over the guidewire across the  strictured area.  Then a 15 mm Savary dilator across the strictured  area.  The Savary dilator and guidewire were removed.  The scope was  then reinserted and I inspected the esophagus.  There was a little heme  in the proximal esophagus area where the strictured area was.  The rest  of the esophagus looked fine.  She tolerated this procedure well without  complications.   IMPRESSION:  Proximal esophageal stenosis dilated to 15 mm.  We will  observe the response to the dilatation.  There was also a 1 cm benign-  appearing gastric polyp that was biopsied.           ______________________________  Graylin Shiver, M.D.     SFG/MEDQ  D:  05/05/2007  T:  05/05/2007  Job:  [id number]   cc:   Cassell Clement, M.D.

## 2010-06-25 NOTE — Op Note (Signed)
NAME:  Karen [name], Karen [name]               ACCOUNT NO.:  [id number]   MEDICAL RECORD NO.:  [id number]          PATIENT TYPE:  INP   LOCATION:  3304                         FACILITY:  [facility]   PHYSICIAN:  Cherylynn Ridges, M.D.    DATE OF BIRTH:  1929-07-09   DATE OF PROCEDURE:  09/08/2008  DATE OF DISCHARGE:                               OPERATIVE REPORT   PREOPERATIVE DIAGNOSIS:  Open abdomen with infection and necrosis.   POSTOPERATIVE DIAGNOSIS:  Open abdomen with infection and necrosis.   PROCEDURE:  Irrigation, debridement, and change of vacuum-assisted  closure dressing on open abdomen.   SURGEON:  Marta Lamas. Lindie Spruce, MD   ANESTHESIA:  General endotracheal.   ESTIMATED BLOOD LOSS:  Less than 50 mL.   COMPLICATIONS:  None.   CONDITION:  Serious to fair.   INDICATIONS FOR OPERATION:  The patient has an open abdomen from a  previous dehiscence enteric contamination and necrosis of fascia who was  taken to the operating room 2 days ago for debridement and VAC dressing  change and drainage, now comes in for replacement of her drains and  packing and replacement of VAC dressing.   OPERATION:  The patient was taken to the operating room, placed on table  in a supine position.  After an adequate general endotracheal anesthetic  was administered, her VAC dressing was removed and appropriate prep was  performed.   The patient had a Kerlix gauze in the right upper quadrant between the  anterior abdominal wall and the rib cage and the abdominal wall on the  right side.  This packing was soaked with purulent material; however,  there was no obvious enteric content, it was not foul smelling or  feculent.  We removed the Kerlix gauze.  Ended up replacing with 2 wet-  to-dry Kerlix gauzes on that right side.  There was significant  improvement in the amount of necrosis and the tissue on that right side,  however, still significant contamination and what appeared to be  infection.  There was no  active drainage and again no obvious feculent  drainage.   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  This was actively leaking  bile.  We attempted to repair this with several 2-0 silk sutures.  At  the end of the case, there was no bile leakage; however, I would be  surprised that these would hold these sutures.  The drain of the [place]  drain was still in place near that side and will likely drain that area.   We used a pulse lavage, irrigated out the abdomen in the right upper  quadrant, the abdomen, around the stoma in the left upper quadrant.  All  obvious necrotic debris was removed either with sharp debridement with  Metzenbaum scissors or with a gauze whipping.   Once we had completed our debridement and irrigation, we packed the  right upper quadrant with 2 Kerlix gauze wet-to-dry, then we placed a  Mepitel on top of the bowel directly, and laid the [place] drain across  the abdomen on top of the Mepitel, then placed a VAC dressing using 3  layers, the plastic line layer on top and then 2 black foams on top of  that.  There was excellent VAC coverage and suction for the wound.  Stoma device was then placed.      Cherylynn Ridges, M.D.  Electronically Signed     JOW/MEDQ  D:  09/08/2008  T:  09/09/2008  Job:  [id number]

## 2010-06-25 NOTE — Op Note (Signed)
NAME:  Karen [name], Karen [name]               ACCOUNT NO.:  [id number]   MEDICAL RECORD NO.:  [id number]          PATIENT TYPE:  INP   LOCATION:  0010                         [facility]ACILITY:  [facility]   PHYSICIAN:  Marlowe Kays, M.D.  DATE OF BIRTH:  July 29, 1929   DATE OF PROCEDURE:  07/02/2006  DATE OF DISCHARGE:                               OPERATIVE REPORT   PREOPERATIVE DIAGNOSIS:  Osteoarthritis left hip.   POSTOPERATIVE DIAGNOSIS:  Osteoarthritis left hip.   OPERATION:  Osteonics total hip replacement, left.   SURGEON:  Marlowe Kays, M.D.   ASSISTANT:  Georges Lynch. Darrelyn Hillock, M.D.   INDICATIONS:  She had painful advanced osteoarthritic left hip.   DESCRIPTION OF PROCEDURE:  Time-out was performed.  Prophylactic  antibiotics.  Right lateral decubitus position on the Sharpes II frame.  Left hip was prepped with DuraPrep and sterile field.  Posterolateral  incision down to the fascia lata, Zickel's band was cut and external  rotators detached from the femur.  In general they were somewhat  deficient.  The hip capsule was identified.  Subtotal capsulectomy  performed and the hip dislocated.  The piriformis fossa was cleared of  soft tissue and I then placed a guide pin down through it into the  femur, overdrilling it,  followed by canal finder.  Just prior to this,  he had cut the femoral neck just below the femoral head, then began a  vertical reaming process along the way.  Used the trochanteric reamer  and also made my final femoral neck cut using a guide a fingerbreadth  above the lesser trochanter.  I reamed up to a #8 which is what was  templated, then began the rasping process.  In general, her bone seen to  be quite good for a 77 year old, and consequently I elected to proceed  with the noncemented femoral component.  We were able to rasp up to #8  and reamed for the distal femoral tip at 12.5 feet.  After completing  this process, I then went to the acetabulum and completed the  capsulectomy and began deepening and reaming process.  She had a fairly  deep fossa ovale and also on finally, and by the time I had leveled this  off, there was not a lot of residual acetabular wall which I monitored  with a small drill hole.  Then began the expanding process, working up  to a 50.  At this point even though her acetabulum was sclerotic on  plain x-rays, we felt we had obtained good cancellus bone.  A trial 50  cuff fit nicely and consequently we went to the final porous coated  Trident PSL HA cup, impacting it and stabilizing with two screws which  were individually drilled, measured and screwed.  Then went through a  trial reduction using a 10-degree liner centered at about 2 o'clock and  a -5 neck length which gave [place] a nice snug fit.  We felt that maybe a  little more coverage anteriorly would be desirable and consequently  plans were made to rotate the final polyethylene shell at the 12 o'clock  position.  This was done after removing the trial components, the wound  was irrigated and the final polyethylene cup placed.  It is noted to  accept a 36 mm head.  I then inserted a size 8, 127-degree Secur-Fit  plus max femoral component and we went through another trial which  gently impacted.  There was no iatrogenic fracture noted.  We then went  through another trial reduction.  Once again the -5 neck length appeared  to be appropriate and consequently the -5 C tapered head was placed, the  hip reduced and found to be nicely stable in all directions with good  coverage.  There wound was then irrigated with sterile saline.  Remnants  of the external rotators were reattached to the femur the Zickel's band  was reapproximated as well, both with #1 Vicryl.  Fascia lata was closed  with the same.  Subcutaneous tissue with combination of #1 and  2-0  Vicryl, staples in the skin.  Betadine and Adaptic dressing were  applied.  She was gently placed on her back in an abduction  pillow and  then transferred to her PACU bed.  Estimated blood loss was 750 mL.  No  blood replacement.  No known complications.           ______________________________  Marlowe Kays, M.D.     JA/MEDQ  D:  07/02/2006  T:  07/02/2006  Job:  [id number]

## 2010-06-25 NOTE — Consult Note (Signed)
NAME:  Karen [name], Karen [name]               ACCOUNT NO.:  [id number]   MEDICAL RECORD NO.:  [id number]          PATIENT TYPE:  INP   LOCATION:  2030                         FACILITY:  [facility]   PHYSICIAN:  Acey Lav, MD  DATE OF BIRTH:  04/15/1929   DATE OF CONSULTATION:  DATE OF DISCHARGE:                                 CONSULTATION   REQUESTING PHYSICIAN:  [facility]   REASON FOR INFECTIOUS DISEASE CONSULTATION:  A patient with persistent  fevers, persistent fungemia after treatment for sepsis with anastomotic  leak and exploratory laparotomy and small bowel resection.   HISTORY OF PRESENT ILLNESS:  Ms. Karen [name] is a 77 year old Caucasian  female who underwent a right colectomy for colon cancer on approximately  June 8.  She became ill, anuric, septic and went into renal failure and  was transferred to [facility].  At [facility] she had imaging  of the abdomen which showed contrast in the peritoneal cavity and she  was found to have an anastomotic leak.  She was taken urgently to the  operating room by Dr. Abbey Chatters who performed exploratory laparotomy  with resection of small bowel, resection of her ileocolonic anastomosis,  placement of an ileostomy and packing of her abdominal wound with gauze.  The patient was covered with Zosyn and has been continued on this since  then.  In the interim she developed fungemia which grew from her  hemodialysis catheter and from peripheral site on July 12.  This was  later identified as Candida glabrata.  She was started on fluconazole on  July 14 and was continued through yesterday.  She had repeat blood  cultures taken on July 19 and these are again growing yeast 2 out of 2  cultures.  Of note, the patient had did have a PICC line in place  through which she was getting total parenteral nutrition.  This has been  removed and discontinued.  She is scheduled for a 2-D echocardiogram  today.  She remains persistently  febrile and has copious drainage from  her abdominal wound.  My colleague, Dr. Sampson Goon, was called yesterday  about the patient in the evening and he changed her from fluconazole to  caspofungin.   PAST MEDICAL HISTORY:  1. Adenocarcinoma status post partial colectomy as described above.  2. Exploratory laparotomy with small bowel resection and resection of      ileocolonic anastomosis as described above with ileostomy.  3. History of surgery for bladder cancer.  4. Coronary artery disease with a left bundle branch block followed by      Dr. Patty Sermons.  5. Gastroesophageal reflux disease.  6. Hypothyroidism.  7. History of total abdominal hysterectomy and bilateral salpingo-      oophorectomy.  8. Total left hip replacement 2008.  9. Prior left hemicolectomy for cancer in 1989.  10.History of carpal tunnel syndrome.   FAMILY HISTORY:  Noncontributory.   SOCIAL HISTORY:  Married, retired, has four children.   REVIEW OF SYSTEMS:  As described in the history of present illness.  Pertinent for cough also with some occasional productive  sputum, copious  drainage, fevers, some abdominal pain.  Otherwise 10 point review of  systems negative.   CURRENT MEDICATIONS AND ANTIBIOTIC HISTORY:  1. Piperacillin, tazobactam, Zosyn started on July 12 and continued      through present day, fluconazole started on July 14 and continued      through yesterday, caspofungin initiated yesterday with a one time      dose of 70 mg and now 50 mg being given.  2. Other medications acyclovir topically q.i.d.  3. Aspirin.  4. Lovenox.  5. Dilaudid.  6. Lactose free nutrition.  7. Levothyroxine.  8. Metoprolol.  9. Nystatin.  10.Pantoprazole.  11.Protein supplements.  12.Tylenol.  13.Ambien.   ALLERGIES:  CODEINE, MEPERIDINE, MORPHINE, PROCAINE.   PHYSICAL EXAMINATION:  VITAL SIGNS:  T maximum last 24 hours 101.2, T  current 97.3, blood pressure 128/59, pulse 72, respirations 20, pulse ox   90% on 2 liters via nasal cannula.  GENERAL:  A dysphoric lady who is in pain while wound dressings were  being made.  She is alert and oriented x3.  HEENT:  Normocephalic, atraumatic.  Pupils equal, round and reactive to  light.  She has some herpetiform lesions above her lips.  Oropharynx  clear.  CARDIOVASCULAR:  Regular rate and rhythm.  No murmurs, gallops or rubs.  ABDOMEN:  She has a large wound which extends from right to left and  across the upper quadrants of her abdomen.  The right lateral aspect of  this wound drained copious brown bile colored material fairly vigorously  with removal of bandages.  This persistently drains for at least a  minute after removal of the gauze.  That slowed down a bit but then  still continued to drain material.  There was some erythema around this  wound as well.  She has diminished bowel sounds.  Her ostomy site has  dark liquidy stool in it.  EXTREMITIES:  Her extremities are with trace edema.  NEUROLOGICAL:  Nonfocal.   LABORATORY DATA:  Chest x-ray two views shows some bilateral pleural  effusions present.  CT abdomen and pelvis on July 12 showed a  perioperative bowel leak with collection of contrast enteric contents  adjacent to small bowel, large bowel anastomosis level mid transverse  colon with free transverse and free air elsewhere.  There is a large  right abdominal oblique muscular area suspicious for postoperative  spontaneous intramuscular hematoma versus superinfection, moderate right  pleural effusion.   Metabolic panel today sodium 142, potassium 3.5, chloride 108, bicarb  27, BUN and creatinine 24 and 1.5, glucose 113, calcium 7.8.  CBC  differential white count 5.1, hemoglobin 9.3, platelets 315.   MICROBIOLOGICAL DATA:  Cultures from July 19 right arm growing yeast,  another one from July 19 from the right hand no growth to date.  Wound  culture August 27, 2008, yeast consistent with candida, anaerobic culture,  no growth.   Blood cultures from July 12 two out of two grew Candida  glabrata.   IMPRESSION AND RECOMMENDATIONS:  This is a 77 year old Caucasian female  with multiple comorbidities who underwent hemicolectomy for  adenocarcinoma complicated by anastomotic leak, sepsis with renal  failure with the patient having undergone exploratory laparotomy by Dr.  Abbey Chatters on July 12 with exploratory laparotomy, small bowel resection  and resection of ileocolonic anastomosis, ileostomy.  The patient since  then has remained on Zosyn but has been persistently febrile and now  growing Candida glabrata from yeast which has been persistent  from July  12 through July 19.  1. Candida glabrata fungemia.  Unfortunately Candida glabrata is      frequently resistant to azoles and she was likely receiving      ineffective antimicrobial therapy throughout this time.      Additionally the patient was receiving total parenteral nutrition      through a PICC line which is high risk for fungemia as well.  The      PICC line is out and the TPN is no longer being administered.  She      has been correctly changed to caspofungin.  I would continue      caspofungin and she will require at least 2 weeks of therapy.  I      think in addition to the 2-D echocardiogram she needs a      transesophageal echocardiogram to look for vegetation given that      she has had essentially unchecked fungemia from July 12 to now July      24, nearly 12 days of fungemia.  She also need an eye exam by      ophthalmology to look for endophthalmitis.  2. Intra-abdominal infection.  The patient had anastomotic leak and      sepsis from this.  She has persistent drainage of dark brown almost      bilious material from this wound.  I am concerned she potentially      could have a fistula or undrained abscess in her abdomen.  I agree      with getting a CT of the abdomen and pelvis with oral and IV      contrast to look for this.  I will touch base  with general surgery      as well.  I agree with continuing Zosyn for now.  Certainly should      she deteriorate I would broaden her to carbapenem and also consider      vancomycin although Staphylococcus would be an unlikely bacteria in      a patient with intra-abdominal pathology as the primary source of      her troubles.  3. History of previous renal failure.  This is resolving.  Will dose      antibiotics appropriately according to function.  She is getting      hydration prior to her CT contrast.   Thank you for this infectious disease consultation.  We will follow  along closely.  Dr. Maurice March will be here tomorrow.      Acey Lav, MD  Electronically Signed     CV/MEDQ  D:  09/03/2008  T:  09/03/2008  Job:  [id number]   cc:   Adolph Pollack, M.D.  Velora Heckler, MD

## 2010-06-26 NOTE — Discharge Summary (Signed)
  NAME:  Karen [name], MENEELY               ACCOUNT NO.:  [id number]  MEDICAL RECORD NO.:  [id number]           PATIENT TYPE:  I  LOCATION:  5118                         FACILITY:  [facility]  PHYSICIAN:  Wilmon Arms. Corliss Skains, M.D. DATE OF BIRTH:  1929/12/05  DATE OF ADMISSION:  06/07/2010 DATE OF DISCHARGE:  06/10/2010                              DISCHARGE SUMMARY   HISTORY OF PRESENT ILLNESS:  Ms. Karen [name] is a patient well known to our service for prior extensive abdominal history and surgery.  She has had multiple previous procedures.  She has also been back multiple times for partial small bowel obstruction secondary to adhesive disease from her extensive surgical history.  She represented with complaint of abdominal discomfort and nausea and vomiting, and was readmitted on June 07, 2010, with the diagnosis of partial small bowel obstruction.  SUMMARY OF HOSPITAL COURSE:  The patient was admitted on April 27 with diagnosis of partial small bowel obstruction.  She was admitted and placed at bowel rest with an NG tube for bowel decompression.  She quickly improved having some output from her ileostomy and significantly feeling better.  Her NG tube was clamped and subsequently discontinued. She was started on liquid diet and advanced to full liquid diet.  She understands that she is to avoid certain food products that potentially cause food impaction or bezoar for her; but after tolerating full liquid diet, she felt well enough to go home as of June 10, 2010.  DISCHARGE DIAGNOSIS:  Recurrent partial small bowel obstruction.  DISCHARGE/PLAN:  The patient will resume her previous diet.  She is encouraged to avoid raw fruits and vegetables that are set up for her to have recurrent symptoms.  She can otherwise follow up with Dr. Abbey Chatters in office on a p.r.n. basis as well as her primary care physician.     Brayton El, PA-C   ______________________________ Wilmon Arms. Corliss Skains,  M.D.    KB/MEDQ  D:  06/18/2010  T:  06/19/2010  Job:  [id number]  Electronically Signed by Brayton El  on 06/25/2010 01:25:04 PM Electronically Signed by Manus Rudd M.D. on 06/26/2010 07:57:13 AM

## 2010-06-28 NOTE — H&P (Signed)
[place]  Patient:    Karen [name], Karen [name] Visit Number: [id number] MRN: [id number]          Service Type: Attending:  Darden Palmer., M.D. Dictated by:   Darden Palmer., M.D. Adm. Date:  12/10/00   CC:         Dr. Fara Chute in [place]  Dr. Cassell Clement   History and Physical  REASON FOR ADMISSION:  Chest pain and for catheterization.  HISTORY:  The patient is a 77 year old woman who has a prior history of a left bundle-branch block.  She underwent a cardiac catheterization in 1987 with findings of normal coronary arteries at that time.  She had normal ejection fraction at that time.  At that time she had had a longstanding history of chest pain and palpitations.  Since that time she has had episodic chest discomfort and respiratory problems as well as palpitations.  She recently had the onset of throat pain and chest pain which has worsened.  Last summer she had an adenosine Cardiolite scan which showed breast attenuation.  She had a recent onset of a Cardiolite scan with findings of chest tightness and throat tightness and left bundle-branch block pattern.  EF was only 36%.  There was possible reversible anterior wall ischemia although there was a question about whether this could be breast attenuation.  Because of this, catheterization was advised.  The patient has continued to have a history of chest discomfort and tightness and exertional dyspnea.  PAST MEDICAL HISTORY:  Her past history is complex.  She has a longstanding history of esophageal reflux.  She has never been diagnosed with hypertension but her blood pressure has been elevated recently.  There is a longstanding history of tachycardia.  She has hypothyroidism and significant osteoarthritis.  PREVIOUS SURGERY:  She has recently had carpal tunnel surgery on her right hand, cholecystectomy, right breast biopsy, right knee arthroscopy, right shoulder spurs,  colectomy for colon cancer, vaginal hysterectomy, tonsillectomy, growth on her right ovary, oophorectomy, mass in bladder, vein stripping bilaterally.  She has had ear surgery four times.  ALLERGIES:  DEMEROL, CODEINE, NORPACE, and MORPHINE.  She complains of tachycardia from NOVOCAINE but can take Carbocaine okay; tolerated Xylocaine well previously.  CURRENT MEDICATIONS:  1. Maxzide 75/50 one-half tablet daily.  2. Premarin 1.25 daily.  3. Xanax 0.25 q.h.s.  4. Aspirin daily.  5. Verapamil 180 mg daily.  6. Tenormin 50 mg daily.  7. Vitamin E 800 mg daily.  8. Prilosec 40 mg daily.  9. Elavil 25 q.h.s. 10. Celebrex 200 mg daily. 11. Synthroid 0.05 mg daily. 12. Flexeril 10 mg p.r.n. 13. Nitroglycerin p.r.n.  FAMILY HISTORY:  Father died age 58 of leukemia.  Mother died age 41 of stroke and bladder cancer.  Three brothers are living - one has had bypass grafting. She has two sisters.  SOCIAL HISTORY:  She is a retired Diplomatic Services operational officer for [employer].  She married at age 67 and has been married for 52 years.  She and her husband have four children.  She attends [employer] up in [place].  She has been mildly anxious.  REVIEW OF SYSTEMS:  She has significant difficulty with weight gain and has gained significant amounts of weight.  She has insomnia at night and has had significant fatigue.  She wears glasses and has a strabismus in the right eye. She has poor hearing and has had several ear operations and is supposed to wear bilateral  hearing aids but only wears it on the left side.  She does have moderate dyspnea on exertion.  She does not have pleuritic-type chest pain. She has significant symptoms of esophageal reflux and has had intermittent constipation.  She has some urgency and some mild stress incontinence.  She has significant arthritis.  She continues to have pain involving her right wrist, for which she wears an ankle brace.  She does tend to be anxious  and mildly depressed.  Other than as noted above, the remainder of the review of systems is unremarkable.  PHYSICAL EXAMINATION:  GENERAL:  She is an obese, anxious-appearing woman who appears her stated age.  VITAL SIGNS:  Blood pressure 160/90 sitting and standing, pulse 76.  SKIN:  Warm and dry.  HEENT:  The patient wears glasses.  There is a right external strabismus present.  EOMI, PERRLA.  C&S clear.  Fundi were normal.  Pharynx is negative. There is a hearing aid present in the left ear.  NECK:  Supple without masses.  No JVD, thyromegaly, or bruits.  LUNGS:  Clear to A&P.  LYMPH NODES:  Normal.  BREASTS:  Not examined.  CARDIAC:  Shows normal S1 and S2, no S3, S4, or murmur.  ABDOMEN:  Obese, soft and nontender.  No mass and no organomegaly.  Femoral pulses are 2+ without bruits.  EXTREMITIES:  Show no significant edema.  There are moderate venous varicosities present over both lower extremities.  There are also previous surgical scars of vein stripping present bilaterally.  Posterior tibial pulses are 1+ and dorsalis pedis are 2+ bilaterally.  NEUROLOGIC:  Normal.  GENITOURINARY AND RECTAL:  Deferred due to cardiac reasons.  LABORATORY DATA:  A 12-lead electrocardiogram shows a left bundle-branch block pattern.  Recent chest x-ray shows cardiomegaly.  IMPRESSION: 1. Throat and chest discomfort which has been progressive, rule out    significant coronary artery disease in light of an abnormal Cardiolite. 2. Cardiomegaly with depressed ejection fraction consistent with    cardiomyopathy, rule out ischemia; look at drug effect or possible viral    as an etiology. 3. Elevation of blood pressure without previous diagnosis of hypertension. 4. Episodic palpitations. 5. Esophageal reflux. 6. History of vein stripping. 7. Hypothyroidism under treatment.   RECOMMENDATIONS:  The patient is brought in at this time for same-day cardiac catheterization, possible  angioplasty and stenting.  The procedure was discussed with the patient fully including risks of MI, death, or CVA, and she is agreeable to proceed. Dictated by:   Darden Palmer., M.D. Attending:  Darden Palmer., M.D. DD:  12/09/00 TD:  12/09/00 Job: [id number]

## 2010-06-28 NOTE — Op Note (Signed)
   NAME:  Karen [name], [name]                         ACCOUNT NO.:  [id number]   MEDICAL RECORD NO.:  [id number]                   PATIENT TYPE:  AMB   LOCATION:  DAY                                  FACILITY:  [facility]   PHYSICIAN:  J. Darreld Mclean, M.D.              DATE OF BIRTH:  [birth year]   DATE OF PROCEDURE:  DATE OF DISCHARGE:                                 OPERATIVE REPORT   PREOPERATIVE DIAGNOSIS:  Carpal tunnel syndrome, left.   POSTOPERATIVE DIAGNOSIS:  Carpal tunnel syndrome, left.   PROCEDURE:  Release volar carpal ligament. saline neurolysis epineurotomy  left median nerve.   ANESTHESIA:  Bier block.   SURGEON:  J. Darreld Mclean, M.D.   ASSISTANT:  Candace Cruise, P.A.   INDICATIONS FOR PROCEDURE:  The patient is a 77 year old female with a  history of carpal tunnel syndrome on the left proven by nerve conduction  EMG. She previously had right carpal tunnel release in 2002, done well with  it and now she has pain and tenderness on the left not improved by  conservative treatment. Surgery is now recommended. Risks and imponderables  have been discussed with the patient preoperatively.  She understands and  wants to proceed forward.  She states she understands it well.   DESCRIPTION OF PROCEDURE:  The patient was placed supine on the operating  room table and Bier block was given. Her arm was prepped and draped in the  usual manner. We reascertained we were doing Ms. Sherrod's left arm. An  incision was made and with careful dissection the median nerve was  identified proximally. A Vessiloop placed around the nerve. A groove  director was then placed in the carpal tunnel space and the volar carpal  ligament was then incised. Specimen of volar carpal ligament sent to  pathology. The  nerve was obviously compressed. Retinaculum cut proximally,  saline neurolysis epineurotomy carried over the nerve. Nerve reinspected, no  apparent injury, wound reapproximated using 3-0 nylon  suture interrupted  vertical mattress manner. Sterile dressing applied, bulky dressing applied,  sheet cotton applied, sheet cotton cut dorsally. Volar plaster splint  applied, Ace bandage applied. The patient tolerated the procedure well and  will go to recovery in good condition. A prescription for Darvocet 100 given  for pain. I will see her in the office in approximately 10 days to 2 days.  Any difficulty or problem she is to call the office and let me know, numbers  have been provided.                                               Teola Bradley, M.D.    JWK/MEDQ  D:  08/25/2002  T:  08/25/2002  Job:  [id number]

## 2010-06-28 NOTE — Cardiovascular Report (Signed)
[place]  Patient:    Karen [name], Karen [name] Visit Number: [id number] MRN: [id number]          Service Type: CAT Location: [facility] 2852 01 Attending Physician:  Norman Clay Dictated by:   Darden Palmer., M.D. Proc. Date: 12/10/00 Admit Date:  12/10/2000   CC:         Fara Chute, M.D.  Thomas A. Patty Sermons, M.D.   Cardiac Catheterization  INDICATIONS: The patient is a 77 year old female with left bundle-branch block, chest pain and abnormal Cardiolite test. She also has cardiomyopathy noted.  COMMENTS ABOUT PROCEDURE: The patient tolerated the procedure well without complications. At the end of the procedure, she had good hemostasis and good pedal pulses present. Please see the attached catheterization log for remainder of the details.  HEMODYNAMIC DATA: Aorta post contrast 153/76, LV post contrast 153/21.  ANGIOGRAPHIC DATA:  LEFT VENTRICULOGRAM: The left ventriculogram performed in the 30 degree RAO projection. The aortic valve appears normal. The mitral valve appears normal. The left ventricle is mildly dilated. There is mild to moderate diffuse hypokinesis noted with an estimated ejection fraction of 35%. Coronary arteries arise and distribute normally. No significant coronary calcification is present.  The left main coronary artery appears normal.  The left anterior descending appears normal and has a large first diagonal branch. No significant coronary disease is noted.  The circumflex coronary artery contains a large marginal and two smaller marginal branches and is free of disease.  The right coronary artery is normal and contains no significant disease.  IMPRESSION: 1. Dilated left ventricle with mildly elevated left ventricular end-diastolic    pressure and diffuse hypokinesis compatible with cardiomyopathy. 2. No significant coronary artery disease identified. Coronaries appear     normal.  RECOMMENDATIONS: Discontinuation of verapamil, add ACE inhibitors and other treatment for depressed LV function to regimen. Dictated by:   Darden Palmer., M.D. Attending Physician:  Norman Clay DD:  12/10/00 TD:  12/10/00 Job: [id number]

## 2010-07-22 ENCOUNTER — Encounter: Payer: Self-pay | Admitting: Cardiology

## 2010-07-24 ENCOUNTER — Encounter: Payer: Self-pay | Admitting: Cardiology

## 2010-07-24 ENCOUNTER — Ambulatory Visit (INDEPENDENT_AMBULATORY_CARE_PROVIDER_SITE_OTHER): Payer: Medicare Other | Admitting: Cardiology

## 2010-07-24 VITALS — BP 178/80 | HR 69 | Wt 152.0 lb

## 2010-07-24 DIAGNOSIS — I119 Hypertensive heart disease without heart failure: Secondary | ICD-10-CM

## 2010-07-24 DIAGNOSIS — I447 Left bundle-branch block, unspecified: Secondary | ICD-10-CM

## 2010-07-24 DIAGNOSIS — R079 Chest pain, unspecified: Secondary | ICD-10-CM | POA: Insufficient documentation

## 2010-07-24 DIAGNOSIS — E785 Hyperlipidemia, unspecified: Secondary | ICD-10-CM

## 2010-07-24 DIAGNOSIS — E039 Hypothyroidism, unspecified: Secondary | ICD-10-CM

## 2010-07-24 MED ORDER — LOSARTAN POTASSIUM-HCTZ 100-12.5 MG PO TABS: 1.0000 | ORAL_TABLET | Freq: Every day | ORAL | Status: DC

## 2010-07-24 NOTE — Progress Notes (Signed)
Karen [name] Date of Birth:  [birth year] [facility] / [facility] [address]   [address] [place], [place]  [address] [phone]           Fax   [phone]  History of Present Illness: This pleasant 77 year old woman is seen for a scheduled followup office visit.  She has not been feeling as well as usual.  She has been experiencing exertional throat pain when she walks.  She does not have it at rest.  She has been on last blood pressure medication since her recent hospitalizations for intestinal obstruction and adhesions in March 2012.  Her weight is essentially unchanged since last visit.  She does not have any history of known coronary disease and she had normal coronary arteriogram in 2002 and she had a normal adenosine Cardiolite stress test in 2009.  Her last echocardiogram was 05/14/09 and showed an ejection fraction of 50-55% and paradoxical septal motion consistent with her known left bundle-branch block and no significant valvular heart disease.  She does have diastolic dysfunction with impaired relaxation.  Current Outpatient Prescriptions  Medication Sig Dispense Refill  . ALPRAZolam (XANAX) 0.5 MG tablet Take 0.5 mg by mouth at bedtime.        Marland Kitchen aspirin 325 MG tablet Take 325 mg by mouth daily.        . carvedilol (COREG) 12.5 MG tablet Take 12.5 mg by mouth 2 (two) times daily with a meal.        . digoxin (LANOXIN) 0.125 MG tablet Take 1 tablet (125 mcg total) by mouth daily.  60 tablet  5  . esomeprazole (NEXIUM) 40 MG capsule Take 40 mg by mouth daily before breakfast.        . folic acid (FOLVITE) 1 MG tablet Take 1 mg by mouth daily.        Marland Kitchen levothyroxine (SYNTHROID, LEVOTHROID) 75 MCG tablet Take 75 mcg by mouth daily.        . metoprolol succinate (TOPROL-XL) 25 MG 24 hr tablet Take 25 mg by mouth as needed.        . Multiple Vitamin (MULTIVITAMIN) tablet Take 1 tablet by mouth daily.        Marland Kitchen DISCONTD: losartan-hydrochlorothiazide (HYZAAR) 100-12.5  MG per tablet Take 1 tablet by mouth daily. 1/2 daily      . DISCONTD: valsartan (DIOVAN) 80 MG tablet Take 80 mg by mouth daily.        Marland Kitchen losartan-hydrochlorothiazide (HYZAAR) 100-12.5 MG per tablet Take 1 tablet by mouth daily.  30 tablet  12  . DISCONTD: losartan-hydrochlorothiazide (HYZAAR) 100-12.5 MG per tablet Take 1 tablet by mouth daily.  30 tablet  12  . DISCONTD: valsartan-hydrochlorothiazide (DIOVAN-HCT) 160-12.5 MG per tablet Take 1 tablet by mouth daily.          Allergies  Allergen Reactions  . Codeine   . Demerol   . Lisinopril     Patient Active Problem List  Diagnoses  . Left bundle branch block  . Chest pain, exertional  . Benign hypertensive heart disease without heart failure  . Hypothyroid  . Dyslipidemia    History  Smoking status  . Former Smoker  . Quit date: 02/11/1963  Smokeless tobacco  . Not on file    History  Alcohol Use No    Family History  Problem Relation Age of Onset  . Stroke    . Hypertension      Review of Systems: Constitutional: no fever chills diaphoresis  or fatigue or change in weight.  Head and neck: no hearing loss, no epistaxis, no photophobia or visual disturbance. Respiratory: No cough, shortness of breath or wheezing. Cardiovascular: No chest pain peripheral edema, palpitations. Gastrointestinal: No abdominal distention, no abdominal pain, no change in bowel habits hematochezia or melena. Genitourinary: No dysuria, no frequency, no urgency, no nocturia. Musculoskeletal:No arthralgias, no back pain, no gait disturbance or myalgias. Neurological: No dizziness, no headaches, no numbness, no seizures, no syncope, no weakness, no tremors. Hematologic: No lymphadenopathy, no easy bruising. Psychiatric: No confusion, no hallucinations, no sleep disturbance.    Physical Exam: Filed Vitals:   07/24/10 1138  BP: 178/80  Pulse: 69  The general appearance reveals a well-developed well-nourished woman in no distress.The  head and neck exam reveals pupils equal and reactive.  Extraocular movements are full.  There is no scleral icterus.  The mouth and pharynx are normal.  The neck is supple.  The carotids reveal no bruits.  The jugular venous pressure is normal.  The  thyroid is not enlarged.  There is no lymphadenopathy.  The chest is clear to percussion and auscultation.  There are no rales or rhonchi.  Expansion of the chest is symmetrical.  The precordium is quiet.  The first heart sound is normal.  The second heart sound is physiologically split.  There is no murmur gallop rub or click.  There is no abnormal lift or heave.  The abdomen is soft and nontender.  The bowel sounds are normal.  The liver and spleen are not enlarged.  There are no abdominal masses.  There are no abdominal bruits.  Extremities reveal good pedal pulses.  There is no phlebitis or edema.  There is no cyanosis or clubbing.  Strength is normal and symmetrical in all extremities.  There is no lateralizing weakness.  There are no sensory deficits.  The skin is warm and dry.  There is no rash.  EKG shows normal sinus rhythm at 69 per minute with left bundle-branch block with secondary ST-T wave abnormalities.  Assessment / Plan: Her high blood pressure may be causing some if not all of her exertional throat pain.  We will adjust her medication and put her back on losartan HCT for better blood pressure control.  We'll plan to see her back in 4 months or sooner if she continues to have difficulty we will consider a repeat LexiScan Myoview if symptoms persist.  She has a office visit appointment with her primary care physician Dr. Neita Carp next week and we can see what progress she is making in blood pressure control.  She may also need to go back up to her previous dose of Coreg 25 mg twice a day if blood pressure remains elevated.

## 2010-07-24 NOTE — Assessment & Plan Note (Signed)
The patient has been experiencing some exertional throat pain.  The pain resolved within several minutes when she stops walking and rests.  The patient does not have any history of known coronary disease.  She does have a known left bundle-branch block the patient had cardiac catheterization in 2002 which showed no significant coronary artery disease.  At the time of catheter ejection fraction was 35% and she had diffuse hypokinesis the patient had a normal one day adenosine Cardiolite stress test on 01/21/08 no evidence of ischemia and her ejection fraction on that study was 74%.  The patient has not tried any sublingual nitroglycerin for her throat pain because it resolves on its own

## 2010-07-24 NOTE — Assessment & Plan Note (Signed)
The patient has a past history of high blood pressure.  She had 3 admissions to [facility] in March 2012 for transient intestinal obstruction secondary to adhesions.  She was on the surgical service at that time.  Her blood pressure was low and her blood pressure medications were reduced.  She comes in today and her blood pressure is high at 180/80.  Her high blood pressure may be contributing to her exertional throat pain.

## 2010-07-25 ENCOUNTER — Encounter: Payer: Self-pay | Admitting: Cardiology

## 2010-08-08 ENCOUNTER — Emergency Department (HOSPITAL_COMMUNITY): Payer: Medicare Other

## 2010-08-08 ENCOUNTER — Encounter (HOSPITAL_COMMUNITY): Payer: Self-pay | Admitting: Radiology

## 2010-08-08 ENCOUNTER — Inpatient Hospital Stay (HOSPITAL_COMMUNITY)
Admission: EM | Admit: 2010-08-08 | Discharge: 2010-08-09 | DRG: 390 | Disposition: A | Discharge status: Home / Self Care | Payer: Medicare Other | Attending: General Surgery | Admitting: General Surgery

## 2010-08-08 DIAGNOSIS — R112 Nausea with vomiting, unspecified: Secondary | ICD-10-CM

## 2010-08-08 DIAGNOSIS — R109 Unspecified abdominal pain: Secondary | ICD-10-CM

## 2010-08-08 DIAGNOSIS — Z932 Ileostomy status: Secondary | ICD-10-CM

## 2010-08-08 DIAGNOSIS — E039 Hypothyroidism, unspecified: Secondary | ICD-10-CM | POA: Diagnosis present

## 2010-08-08 DIAGNOSIS — Z85038 Personal history of other malignant neoplasm of large intestine: Secondary | ICD-10-CM

## 2010-08-08 DIAGNOSIS — I2510 Atherosclerotic heart disease of native coronary artery without angina pectoris: Secondary | ICD-10-CM | POA: Diagnosis present

## 2010-08-08 DIAGNOSIS — I10 Essential (primary) hypertension: Secondary | ICD-10-CM | POA: Diagnosis present

## 2010-08-08 DIAGNOSIS — Z8551 Personal history of malignant neoplasm of bladder: Secondary | ICD-10-CM

## 2010-08-08 DIAGNOSIS — Z7982 Long term (current) use of aspirin: Secondary | ICD-10-CM

## 2010-08-08 DIAGNOSIS — K56609 Unspecified intestinal obstruction, unspecified as to partial versus complete obstruction: Principal | ICD-10-CM | POA: Diagnosis present

## 2010-08-08 HISTORY — DX: Peritoneal adhesions (postprocedural) (postinfection): K66.0

## 2010-08-08 HISTORY — DX: Ileostomy status: Z93.2

## 2010-08-08 LAB — CBC
HCT: 35.7 % — ABNORMAL LOW (ref 36.0–46.0)
MCH: 28.6 pg (ref 26.0–34.0)
MCHC: 33.3 g/dL (ref 30.0–36.0)
RDW: 13.5 % (ref 11.5–15.5)

## 2010-08-08 LAB — DIFFERENTIAL
Basophils Absolute: 0 10*3/uL (ref 0.0–0.1)
Basophils Relative: 0 % (ref 0–1)
Eosinophils Relative: 1 % (ref 0–5)
Monocytes Absolute: 0.5 10*3/uL (ref 0.1–1.0)
Monocytes Relative: 6 % (ref 3–12)

## 2010-08-08 LAB — URINALYSIS, ROUTINE W REFLEX MICROSCOPIC
Hgb urine dipstick: NEGATIVE
Leukocytes, UA: NEGATIVE
Nitrite: NEGATIVE
Protein, ur: NEGATIVE mg/dL
Specific Gravity, Urine: >1.046 — ABNORMAL HIGH (ref 1.005–1.030)
Urobilinogen, UA: 0.2 mg/dL (ref 0.0–1.0)

## 2010-08-08 LAB — DIGOXIN LEVEL: Digoxin Level: 0.4 ng/mL — ABNORMAL LOW (ref 0.8–2.0)

## 2010-08-08 LAB — COMPREHENSIVE METABOLIC PANEL
Albumin: 4 g/dL (ref 3.5–5.2)
Alkaline Phosphatase: 94 U/L (ref 39–117)
BUN: 16 mg/dL (ref 6–23)
Calcium: 8.9 mg/dL (ref 8.4–10.5)
GFR calc Af Amer: >60 mL/min (ref >60)
Potassium: 3.7 mEq/L (ref 3.5–5.1)
Total Protein: 7.3 g/dL (ref 6.0–8.3)

## 2010-08-08 LAB — LIPASE, BLOOD: Lipase: 25 U/L (ref 11–59)

## 2010-08-08 MED ORDER — IOHEXOL 300 MG/ML  SOLN
100.0000 mL | Freq: Once | INTRAMUSCULAR | Status: AC | PRN
  Administered 2010-08-08: 100 mL via INTRAVENOUS

## 2010-08-09 ENCOUNTER — Inpatient Hospital Stay (HOSPITAL_COMMUNITY): Payer: Medicare Other

## 2010-08-09 LAB — CBC
Platelets: 202 10*3/uL (ref 150–400)
RBC: 4.43 MIL/uL (ref 3.87–5.11)
RDW: 13.6 % (ref 11.5–15.5)
WBC: 6.6 10*3/uL (ref 4.0–10.5)

## 2010-08-09 LAB — BASIC METABOLIC PANEL
CO2: 26 mEq/L (ref 19–32)
Chloride: 101 mEq/L (ref 96–112)
Creatinine, Ser: 0.75 mg/dL (ref 0.50–1.10)
GFR calc Af Amer: >60 mL/min (ref >60)
Potassium: 3.1 mEq/L — ABNORMAL LOW (ref 3.5–5.1)
Sodium: 140 mEq/L (ref 135–145)

## 2010-08-10 LAB — URINE CULTURE: Colony Count: 85000

## 2010-08-22 NOTE — H&P (Signed)
[name], [name]               ACCOUNT NO.:  [id number]  MEDICAL RECORD NO.:  [id number]  LOCATION:  [facility]                         FACILITY:  [facility]  PHYSICIAN:  Mary Sella. Andrey Campanile, MD     DATE OF BIRTH:  [birth year]  DATE OF ADMISSION:  08/08/2010 DATE OF DISCHARGE:                             HISTORY & PHYSICAL   PRIMARY CARE PHYSICIAN:  Cassell Clement, MD  CHIEF COMPLAINT:  Abdominal pain, nausea, vomiting.  HISTORY OF PRESENT ILLNESS:  Karen [name] is a 77 year old female, who has an extensive history of recurrent partial bowel obstructions due to very complicated abdominal history, requiring multiple surgeries that initiated with colectomy for colon cancer.  She was most recently admitted early part of May for similar symptoms and had a partial bowel obstruction that resolved with conservative management.  She states she had been well since that time, but has since yesterday had abdominal discomfort and pain, leading up to nausea and vomiting that prompted her to come to the emergency department.  She states her ileostomy has otherwise been functioning fine up until early this morning where there really has not been any significant output from this.  She denies any chest pain, shortness of breath.  She denies any ill contacts or recent viral syndrome.  She denies any blood coming from her ostomy.  She states she has been trying to adhere to the diet that we have given her to try to avoid food impactions or digestive problems that could lead to recurrent bowel obstructions.  However, she is presenting to the emergency department today with similar symptoms.  Her films in the ER looked pretty unremarkable, but the ER physician is concerned that the patient still has pain and nausea and vomiting.  Therefore, we are asked to evaluate the patient.  PAST MEDICAL HISTORY:  Significant for: 1. 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 2. Coronary artery disease. 3. Hypertension. 4. Hypothyroidism. 5. History of bladder cancer.  PAST SURGICAL HISTORY:  As mentioned and included in one of those surgeries was a creation of an ileostomy.  FAMILY HISTORY:  Noncontributory to present case.  SOCIAL HISTORY:  The patient is married, lives at home.  She denies any alcohol, tobacco, or illicit drug use.  Allergies include CODEINE, MORPHINE, DEMEROL, and NOVOCAINE.  Medications currently include: 1. Levothyroxine. 2. Vicodin p.r.n. 3. Nexium once daily. 4. Aspirin 325 mg daily. 5. Folic acid 1 mg daily. 6. Alprazolam 0.5 mg as needed. 7. Coreg 12.5 mg daily. 8. Digoxin 0.125 mg daily.  REVIEW OF SYSTEMS:  Please see history of present illness for pertinent findings, otherwise complete 12-system review negative.  PHYSICAL EXAMINATION:  GENERAL:  A 77 year old female who is in mild distress, but nontoxic appearing, certainly not in any extreme pain. ENT:  Unremarkable. NECK:  Supple without lymphadenopathy.  Trachea is midline.  No thyromegaly or masses. LUNGS:  Clear to auscultation.  No wheezes, rhonchi, or rales. HEART:  Regular rate and rhythm.  No murmurs, gallops, or rubs. Carotids are 2+ and brisk without bruit. ABDOMEN:  Soft and mildly distended.  She has had some  generalized tenderness, but no evidence of peritonitis.  No mass effect is appreciated.  Her right midabdomen ileostomy is pink and viable with minimal-to-no output.  She has an upper abdominal chevron incision that is well healed. RECTAL:  Deferred. EXTREMITIES:  Good active range of motion in all extremities without crepitus or pain.  Normal muscle strength and tone without atrophy. SKIN:  Otherwise warm and dry with good turgor. NEUROLOGIC:  The patient is alert and oriented x3.  DIAGNOSTICS:  CBC today shows a white blood cell count of 7.0, hemoglobin of 11.9, hematocrit of 35.7, platelet count of  178. Metabolic panel shows a sodium of 143, potassium of 3.7, chloride of 103, CO2 of 28, BUN of 16, creatinine of 0.7, glucose of 139.  Liver enzymes within normal limits including a lipase of 25.  Abdominal x-rays show basically unremarkable bowel gas pattern with no evidence of obstruction or pneumoperitoneum.  IMPRESSION: 1. Abdominal pain. 2. Nausea, vomiting. 3. Probable recurrent partial small bowel obstruction secondary to     adhesions. 4. Hypertension. 5. Coronary artery disease.  PLAN:  We will admit the patient, make her n.p.o. status, place an NG tube if the patient has recurrent nausea, vomiting.  We will order a CT scan of her abdomen and pelvis with contrast to further assess for definitive obstruction.  Hopefully, this patient will be able to be managed with conservative management as she has in the past.     Brayton El, PA-C   ______________________________ Mary Sella. Andrey Campanile, MD    KB/MEDQ  D:  08/08/2010  T:  08/08/2010  Job:  [id number]  Electronically Signed by Brayton El  on 08/21/2010 03:05:35 PM Electronically Signed by Gaynelle Adu M.D. on 08/22/2010 02:31:31 PM

## 2010-09-02 ENCOUNTER — Encounter (INDEPENDENT_AMBULATORY_CARE_PROVIDER_SITE_OTHER): Payer: Self-pay | Admitting: General Surgery

## 2010-09-03 ENCOUNTER — Ambulatory Visit (INDEPENDENT_AMBULATORY_CARE_PROVIDER_SITE_OTHER): Payer: Medicare Other | Admitting: General Surgery

## 2010-09-03 ENCOUNTER — Encounter (INDEPENDENT_AMBULATORY_CARE_PROVIDER_SITE_OTHER): Payer: Self-pay | Admitting: General Surgery

## 2010-09-03 VITALS — Wt 149.0 lb

## 2010-09-03 DIAGNOSIS — K56609 Unspecified intestinal obstruction, unspecified as to partial versus complete obstruction: Secondary | ICD-10-CM

## 2010-09-03 DIAGNOSIS — K56600 Partial intestinal obstruction, unspecified as to cause: Secondary | ICD-10-CM

## 2010-09-03 NOTE — Patient Instructions (Signed)
Low residue (low fiber) diet, lifelong.

## 2010-09-03 NOTE — Progress Notes (Signed)
She is here for her post hospital visit. He is admitted briefly at the end of June for what was felt to be a partial small bowel obstruction clinically. Radiographically there is no evidence of this. She feels much better now.  Exam: Her abdomen is soft and nontender. There is a right lower quadrant ileostomy with green pasty output. There is a transverse incision was some weakness in the right upper quadrant area.  Assessment: A partial small bowel obstruction that resolved rapidly. This was more evident on clinical exam and it was radiographically.  Plan: Lifelong low fiber low-residue diet. Return visit p.r.n.

## 2010-12-21 ENCOUNTER — Other Ambulatory Visit: Payer: Self-pay | Admitting: Cardiology

## 2010-12-23 ENCOUNTER — Other Ambulatory Visit: Payer: Self-pay | Admitting: Cardiology

## 2010-12-23 MED ORDER — CARVEDILOL 25 MG PO TABS: 12.5000 mg | ORAL_TABLET | Freq: Two times a day (BID) | ORAL | Status: DC

## 2010-12-30 ENCOUNTER — Ambulatory Visit (INDEPENDENT_AMBULATORY_CARE_PROVIDER_SITE_OTHER): Payer: Medicare Other | Admitting: Cardiology

## 2010-12-30 ENCOUNTER — Encounter: Payer: Self-pay | Admitting: Cardiology

## 2010-12-30 VITALS — BP 128/70 | HR 80 | Ht 59.0 in | Wt 152.0 lb

## 2010-12-30 DIAGNOSIS — E785 Hyperlipidemia, unspecified: Secondary | ICD-10-CM

## 2010-12-30 DIAGNOSIS — I447 Left bundle-branch block, unspecified: Secondary | ICD-10-CM

## 2010-12-30 DIAGNOSIS — I119 Hypertensive heart disease without heart failure: Secondary | ICD-10-CM

## 2010-12-30 DIAGNOSIS — E1169 Type 2 diabetes mellitus with other specified complication: Secondary | ICD-10-CM

## 2010-12-30 DIAGNOSIS — R1310 Dysphagia, unspecified: Secondary | ICD-10-CM | POA: Insufficient documentation

## 2010-12-30 NOTE — Progress Notes (Signed)
Karen [name] [place] Date of Birth:  [birth year] [facility] / [facility] [address]   [address] [place], [place]  [address] [phone]           Fax   [phone]  History of Present Illness: This pleasant 77 year old woman is seen for a scheduled followup office visit.  She has a past history of left bundle branch block.  She does not have any significant valvular heart disease.  She does have normal systolic function with diastolic dysfunction and impaired relaxation by echocardiogram in April of 2011.  Her ejection fraction was 50-55%.  She had normal coronary arteriograms in 2002 and a normal adenosine Cardiolite stress test in 2009.  Since last visit she's not been experiencing any exertional chest pain or angina.  She has begun to have problems with recurrent symptoms of achalasia which previously was treated at [facility] with Botox injections.  She now is experiencing recurrent dysphagia.  Current Outpatient Prescriptions  Medication Sig Dispense Refill  . ALPRAZolam (XANAX) 0.5 MG tablet Take 0.5 mg by mouth at bedtime.        Marland Kitchen aspirin 325 MG tablet Take 325 mg by mouth daily.        . carvedilol (COREG) 25 MG tablet Take 0.5 tablets (12.5 mg total) by mouth 2 (two) times daily with a meal.  30 tablet  6  . digoxin (LANOXIN) 0.125 MG tablet Take 1 tablet (125 mcg total) by mouth daily.  60 tablet  5  . esomeprazole (NEXIUM) 40 MG capsule Take 40 mg by mouth daily before breakfast.        . folic acid (FOLVITE) 1 MG tablet Take 1 mg by mouth daily.        Marland Kitchen levothyroxine (SYNTHROID, LEVOTHROID) 75 MCG tablet Take 75 mcg by mouth daily.        Marland Kitchen losartan-hydrochlorothiazide (HYZAAR) 100-12.5 MG per tablet Take 1 tablet by mouth daily.  30 tablet  12  . metoprolol succinate (TOPROL-XL) 25 MG 24 hr tablet Take 25 mg by mouth as needed.        . Multiple Vitamin (MULTIVITAMIN) tablet Take 1 tablet by mouth daily.        Marland Kitchen HYDROcodone-acetaminophen (VICODIN) 5-500 MG per  tablet Take 5-500 mg by mouth Daily. Given by Dr. Neita Carp        Allergies  Allergen Reactions  . Codeine   . Demerol   . Lisinopril   . Macrobid   . Morphine And Related   . Ramipril (Altace)     Patient Active Problem List  Diagnoses  . Left bundle branch block  . Chest pain, exertional  . Benign hypertensive heart disease without heart failure  . Hypothyroid  . Dyslipidemia  . Small bowel obstruction, partial    History  Smoking status  . Former Smoker  . Quit date: 02/11/1963  Smokeless tobacco  . Not on file    History  Alcohol Use No    Family History  Problem Relation Age of Onset  . Stroke    . Hypertension    . Heart disease Mother   . Stroke Mother   . Cancer Mother   . Arthritis Mother   . Arthritis Father   . Heart disease Father   . Cancer Father     leukemia  . Stroke Sister   . Hypertension Sister   . Other Sister     paralysis  . Heart disease Brother     bypass  surgery  . Cancer Brother   . Arthritis Brother   . Stroke Sister   . Diabetes Sister   . Other Brother     stomach problems  . Other Brother     bladder    Review of Systems: Constitutional: no fever chills diaphoresis or fatigue or change in weight.  Head and neck: no hearing loss, no epistaxis, no photophobia or visual disturbance. Respiratory: No cough, shortness of breath or wheezing. Cardiovascular: No chest pain peripheral edema, palpitations. Gastrointestinal: No abdominal distention, no abdominal pain, no change in bowel habits hematochezia or melena. Genitourinary: No dysuria, no frequency, no urgency, no nocturia. Musculoskeletal:No arthralgias, no back pain, no gait disturbance or myalgias. Neurological: No dizziness, no headaches, no numbness, no seizures, no syncope, no weakness, no tremors. Hematologic: No lymphadenopathy, no easy bruising. Psychiatric: No confusion, no hallucinations, no sleep disturbance.    Physical Exam: Filed Vitals:   12/30/10  1410  BP: 128/70  Pulse: 80   the general appearance reveals a well-developed well-nourished woman in no distress.Pupils equal and reactive.   Extraocular Movements are full.  There is no scleral icterus.  The mouth and pharynx are normal.  The neck is supple.  The carotids reveal no bruits.  The jugular venous pressure is normal.  The thyroid is not enlarged.  There is no lymphadenopathy.  The chest is clear to percussion and auscultation. There are no rales or rhonchi. Expansion of the chest is symmetrical.  The precordium is quiet.  The first heart sound is normal.  The second heart sound is physiologically split.  There is no murmur gallop rub or click.  There is no abnormal lift or heave.  The abdomen reveals a ileostomy on the right side.  Normal extremity without phlebitis or edema.Strength is normal and symmetrical in all extremities.  There is no lateralizing weakness.  There are no sensory deficits.  The skin is warm and dry.  There is no rash.    Assessment / Plan:  Same medication.  Recheck in 4 months for followup office visit and EKG

## 2010-12-30 NOTE — Patient Instructions (Signed)
Your physician recommends that you continue on your current medications as directed. Please refer to the Current Medication list given to you today. Your physician recommends that you schedule a follow-up appointment in: 4 months for office visit and EKG

## 2010-12-30 NOTE — Assessment & Plan Note (Signed)
The patient is beginning to have difficulty swallowing again.  Several years ago she had to have multiple treatments at [facility] and eventually had a Botox injection which helped.  She will discuss her situation further with her primary care physician

## 2010-12-30 NOTE — Assessment & Plan Note (Signed)
The patient has a history of previous supraventricular tachycardia and palpitations.  She has not experienced any recent symptoms.  She's not having any symptoms or blood pressure the present time

## 2010-12-30 NOTE — Assessment & Plan Note (Signed)
Patient complains of weakness and fatigue but no dizziness or syncope.  No episodes of known bradycardia or Stokes-Adams attack

## 2011-01-16 ENCOUNTER — Encounter (INDEPENDENT_AMBULATORY_CARE_PROVIDER_SITE_OTHER): Payer: Self-pay | Admitting: Internal Medicine

## 2011-01-16 ENCOUNTER — Ambulatory Visit (INDEPENDENT_AMBULATORY_CARE_PROVIDER_SITE_OTHER): Payer: Medicare Other | Admitting: Internal Medicine

## 2011-01-16 DIAGNOSIS — R1310 Dysphagia, unspecified: Secondary | ICD-10-CM

## 2011-01-16 DIAGNOSIS — K220 Achalasia of cardia: Secondary | ICD-10-CM

## 2011-01-16 NOTE — Patient Instructions (Signed)
Follow up in 1 year. Schedule appt with [facility]

## 2011-01-16 NOTE — Progress Notes (Addendum)
Subjective:     Patient ID: Karen [name], female   DOB: 1929/11/24, 77 y.o.   MRN: [id number]  HPI   Patient is allergic to Novacaine  Karen [name] is a 77 yr old female presenting with c/o dysphagia.   She has a hx of dysphagia.  She underwent and EGD/ED which did not help years ago.   She was sent to [facility] in 2009 and she received botox to her esophagus which helped with her dysphagia.  She was suppose to return 2010 but cancelled. . She has an ileostomy.  No melena or bright red bleeding. Hx of colon cancer x 2.  Her last colon cancer was in 2010 she had another stage 2 cancer of her ascending colon.   Appetite is good. No weight loss. Flexible sigmoid  09/2009: Patchy erythema which was quite intense in the sigmoid colon.  This may be related to fecal matter or she may be developing diversion colitis.  Today she tell me that dysphagia is occurring every day.     Review of Systems  See hpi Current Outpatient Prescriptions  Medication Sig Dispense Refill  . ALPRAZolam (XANAX) 0.5 MG tablet Take 0.5 mg by mouth at bedtime.        Marland Kitchen aspirin 325 MG tablet Take 325 mg by mouth daily.        . carvedilol (COREG) 25 MG tablet Take 0.5 tablets (12.5 mg total) by mouth 2 (two) times daily with a meal.  30 tablet  6  . digoxin (LANOXIN) 0.125 MG tablet Take 1 tablet (125 mcg total) by mouth daily.  60 tablet  5  . esomeprazole (NEXIUM) 40 MG capsule Take 40 mg by mouth daily before breakfast.        . folic acid (FOLVITE) 1 MG tablet Take 1 mg by mouth daily.        Marland Kitchen HYDROcodone-acetaminophen (VICODIN) 5-500 MG per tablet Take 5-500 mg by mouth Daily. Given by Dr. Neita Carp      . levothyroxine (SYNTHROID, LEVOTHROID) 75 MCG tablet Take 75 mcg by mouth daily.        Marland Kitchen losartan-hydrochlorothiazide (HYZAAR) 100-12.5 MG per tablet Take 1 tablet by mouth daily.  30 tablet  12  . Multiple Vitamin (MULTIVITAMIN) tablet Take 1 tablet by mouth daily.         Past Medical History  Diagnosis Date  . CHF  (congestive heart failure)     SECONDARY TO DIASTOLIC DYSFUNCTION  . DVT of deep femoral vein   . Palpitations   . History of PSVT (paroxysmal supraventricular tachycardia)   . LBBB (left bundle branch block)   . Diabetes mellitus   . Hypertension   . Abdominal adhesions   . Ileostomy present   . Colon polyp   . Heart disease   . Wears dentures   . Breast lump   . Hyperlipidemia   . Thyroid disease   . Cancer     bladder  . Anemia   . History of blood clots   . Hernia   . Blood transfusion   . Hearing loss   . Arthritis   . Colon cancer     x 2. Last 2010   Past Surgical History  Procedure Date  . Cardiac catheterization 12/10/2000    THE LEFT VENTRICLE IS MILDY DILATED. THERE IS MILD TO MODERATE DIFFUSE HYPOKINESIS WITH EF 35%  . Colon cancer surgery   . Ileostomy    Family Status  Relation Status Death Age  .  Mother Deceased   . Father Deceased   . Sister Alive   . Sister Alive    History   Social History  . Marital Status: Married    Spouse Name: N/A    Number of Children: N/A  . Years of Education: N/A   Occupational History  . Not on file.   Social History Main Topics  . Smoking status: Former Smoker    Quit date: 02/11/1963  . Smokeless tobacco: Not on file  . Alcohol Use: No  . Drug Use: No  . Sexually Active:    Other Topics Concern  . Not on file   Social History Narrative  . No narrative on file   Allergies  Allergen Reactions  . Codeine   . Demerol   . Lisinopril   . Macrobid   . Morphine And Related   . Ramipril (Altace)    History   Social History Narrative  . No narrative on file   .    Objective:   Physical Exam Filed Vitals:   01/16/11 1043  BP: 140/62  Pulse: 64  Temp: 98.6 F (37 C)  Height: 5' (1.524 m)  Weight: 148 lb (67.132 kg)   Alert and oriented. Skin warm and dry. Oral mucosa is moist. Natural teeth in good condition. Sclera anicteric, conjunctivae is pink. Thyroid not enlarged. No cervical  lymphadenopathy. Lungs clear. Heart regular rate and rhythm.  Abdomen is soft. Bowel sounds are positive. No hepatomegaly. No abdominal masses felt. No tenderness.  No edema to lower extremities. Patient is alert and oriented.     Assessment:    Hx of colon cancer x 2.  Achalasia. I discussed this case with Dr Karilyn Cota. Will get records from [facility] before proceeding with EGD/Botox.      Plan:    Continue present medication. Flara will come in in the next few days and sign release for her records.

## 2011-01-22 ENCOUNTER — Other Ambulatory Visit (INDEPENDENT_AMBULATORY_CARE_PROVIDER_SITE_OTHER): Payer: Self-pay | Admitting: *Deleted

## 2011-01-22 ENCOUNTER — Other Ambulatory Visit (INDEPENDENT_AMBULATORY_CARE_PROVIDER_SITE_OTHER): Payer: Self-pay | Admitting: Internal Medicine

## 2011-01-22 ENCOUNTER — Telehealth (INDEPENDENT_AMBULATORY_CARE_PROVIDER_SITE_OTHER): Payer: Self-pay | Admitting: Internal Medicine

## 2011-01-22 DIAGNOSIS — K220 Achalasia of cardia: Secondary | ICD-10-CM

## 2011-01-22 NOTE — Telephone Encounter (Signed)
Discussed with Dr. Karilyn Cota.  Records from [facility] indicated Kierstyn has achalasia.  Dr. Karilyn Cota will do Botox.    Ann, EGD with Botox

## 2011-01-22 NOTE — Telephone Encounter (Signed)
EGD w/ Botox sch'd 02/12/10, patient aware

## 2011-01-31 ENCOUNTER — Encounter (HOSPITAL_COMMUNITY): Payer: Self-pay

## 2011-02-12 MED ORDER — SODIUM CHLORIDE 0.45 % IV SOLN
Freq: Once | INTRAVENOUS | Status: AC
  Administered 2011-02-13: 20 mL via INTRAVENOUS

## 2011-02-13 ENCOUNTER — Ambulatory Visit (HOSPITAL_COMMUNITY): Payer: Medicare Other

## 2011-02-13 ENCOUNTER — Ambulatory Visit (HOSPITAL_COMMUNITY)
Admission: RE | Admit: 2011-02-13 | Discharge: 2011-02-13 | Disposition: A | Discharge status: Home / Self Care | Payer: Medicare Other | Source: Ambulatory Visit | Attending: Internal Medicine | Admitting: Internal Medicine

## 2011-02-13 ENCOUNTER — Encounter (HOSPITAL_COMMUNITY)
Admission: RE | Disposition: A | Discharge status: Home / Self Care | Payer: Self-pay | Source: Ambulatory Visit | Attending: Internal Medicine

## 2011-02-13 ENCOUNTER — Encounter (HOSPITAL_COMMUNITY): Payer: Self-pay | Admitting: *Deleted

## 2011-02-13 DIAGNOSIS — Z79899 Other long term (current) drug therapy: Secondary | ICD-10-CM | POA: Diagnosis not present

## 2011-02-13 DIAGNOSIS — E785 Hyperlipidemia, unspecified: Secondary | ICD-10-CM | POA: Insufficient documentation

## 2011-02-13 DIAGNOSIS — K220 Achalasia of cardia: Secondary | ICD-10-CM | POA: Insufficient documentation

## 2011-02-13 DIAGNOSIS — R1310 Dysphagia, unspecified: Secondary | ICD-10-CM | POA: Insufficient documentation

## 2011-02-13 DIAGNOSIS — D130 Benign neoplasm of esophagus: Secondary | ICD-10-CM

## 2011-02-13 DIAGNOSIS — I10 Essential (primary) hypertension: Secondary | ICD-10-CM | POA: Diagnosis not present

## 2011-02-13 DIAGNOSIS — Z7982 Long term (current) use of aspirin: Secondary | ICD-10-CM | POA: Diagnosis not present

## 2011-02-13 DIAGNOSIS — E119 Type 2 diabetes mellitus without complications: Secondary | ICD-10-CM | POA: Diagnosis not present

## 2011-02-13 HISTORY — PX: ESOPHAGOGASTRODUODENOSCOPY: SHX5428

## 2011-02-13 SURGERY — EGD (ESOPHAGOGASTRODUODENOSCOPY)
Anesthesia: Moderate Sedation

## 2011-02-13 MED ORDER — FENTANYL CITRATE 0.05 MG/ML IJ SOLN
INTRAMUSCULAR | Status: DC | PRN
  Administered 2011-02-13 (×4): 25 ug via INTRAVENOUS

## 2011-02-13 MED ORDER — BENZONATATE 100 MG PO CAPS
ORAL_CAPSULE | ORAL | Status: DC | PRN
  Administered 2011-02-13: 200 mg via ORAL

## 2011-02-13 MED ORDER — BENZONATATE 100 MG PO CAPS
ORAL_CAPSULE | ORAL | Status: AC
  Filled 2011-02-13: qty 2

## 2011-02-13 MED ORDER — ONABOTULINUMTOXINA 100 UNITS IJ SOLR
INTRAMUSCULAR | Status: DC | PRN
  Administered 2011-02-13: 100 [IU] via INTRAMUSCULAR

## 2011-02-13 MED ORDER — SODIUM CHLORIDE 0.9 % IJ SOLN
INTRAMUSCULAR | Status: AC
  Filled 2011-02-13: qty 30

## 2011-02-13 MED ORDER — FENTANYL CITRATE 0.05 MG/ML IJ SOLN
INTRAMUSCULAR | Status: AC
  Filled 2011-02-13: qty 2

## 2011-02-13 MED ORDER — MIDAZOLAM HCL 5 MG/5ML IJ SOLN
INTRAMUSCULAR | Status: DC | PRN
  Administered 2011-02-13 (×3): 2 mg via INTRAVENOUS

## 2011-02-13 MED ORDER — STERILE WATER FOR IRRIGATION IR SOLN
Status: DC | PRN
  Administered 2011-02-13: 11:00:00

## 2011-02-13 MED ORDER — MORPHINE SULFATE 4 MG/ML IJ SOLN
INTRAMUSCULAR | Status: AC
  Administered 2011-02-13: 3 mg via INTRAVENOUS
  Filled 2011-02-13: qty 1

## 2011-02-13 MED ORDER — HYDROCODONE-HOMATROPINE 5-1.5 MG/5ML PO SYRP: 5.0000 mL | ORAL_SOLUTION | Freq: Four times a day (QID) | ORAL | Status: AC | PRN

## 2011-02-13 MED ORDER — SODIUM CHLORIDE 0.9 % IV SOLN
INTRAVENOUS | Status: DC
  Administered 2011-02-13: 500 mL via INTRAVENOUS

## 2011-02-13 MED ORDER — MIDAZOLAM HCL 5 MG/5ML IJ SOLN
INTRAMUSCULAR | Status: AC
  Filled 2011-02-13: qty 10

## 2011-02-13 NOTE — Progress Notes (Signed)
Patient underwent  barium esophagogram because she was complaining of chest pain and regurgitated water. Study performed by Dr. Ulyses Southward she has tertiary contractions involving esophageal body some delay in passage of contrast into her stomach. Symptoms appear to be secondary to Botox injection. Plan Liquid diet for 24 hours. Prescription given for Hycodan liquid

## 2011-02-13 NOTE — Op Note (Signed)
EGD PROCEDURE REPORT  PATIENT:  Karen [name]  MR#:  [id number] Birthdate:  18-Jun-1929, 78 y.o., female Endoscopist:  Dr. Malissa Hippo, MD Referred By:  Dr. Estanislado Pandy, MD Procedure Date: 02/13/2011  Procedure:   EGD with Botox injection for achalasia.  Indications:  Patient is 78 year old Caucasian female was history of achalasia she diagnosed at [facility] and she had Botox injection November 2009 and has done well until a few weeks ago. Now she is experiencing dysphagia.. Since she has done so well with Botox therapy, he is undergoing repeat session today.            Informed Consent:  Procedure and risks were reviewed with the patient and informed consent was obtained. Medications:  Fentanyl 100 mcg IV Versed 6 mg IV two Tessalon Perles for topical  oropharyngeal anesthesia  Description of procedure:  The endoscope was introduced through the mouth and advanced to the second portion of the duodenum without difficulty or limitations. The mucosal surfaces were surveyed very carefully during advancement of the scope and upon withdrawal.  Findings:  Esophagus:  Mucosa of the esophagus was normal. Narrow segment was noted just proximal to gastroesophageal junction. Significant resistance noted as the scope was passed across it. However esophageal body was not dilated and no food debris noted. GEJ:  38 cm Stomach:  Stomach was empty and distended very well with insufflation. Few hyperplastic appearing polyps were noted at gastric body and fundus. These were  few millimeters in size with exception of one which was about 10 mm. Largest polyp was located at gastric body along the posterior wall. Pyloric  channel was patent. Angularis was unremarkable. Fundus and cardia were carefully examined and no abnormality noted. Duodenum:  Normal bulbar and post bulbar mucosa.  Therapeutic/Diagnostic Maneuvers Performed:  Botox was injected at 4 sites just  proximal to GE junction. Each site was  injected with 1.5 cc fluid (22 units of Botox). There appeared to be submucosal injection at one site but others appeared to be into the muscle. Postinjection scope was passed across this area with much less resistance.  Complications:  None  Impression: Status post Botox injection for achalasia. Total of 88 units of Botox was injected with 22 units at each site. Hyperplastic appearing gastric polyps which were left alone.  Recommendations:  Standard instructions given. Patient advised to call office  a progress report next week.  Baron Parmelee U  02/13/2011  11:36 AM  CC: Dr. Estanislado Pandy, MD & Dr. Bonnetta Barry ref. provider found

## 2011-02-13 NOTE — H&P (Signed)
This is an update to history and physical from 01/16/2011. Patient is here for therapeutic EGD. She is undergoing EGD with Botox injection for achalasia. Initial injection was in November 2009 at [facility]. Now she is experiencing solid food dysphagia and therefore undergoing this procedure.

## 2011-02-17 ENCOUNTER — Telehealth (INDEPENDENT_AMBULATORY_CARE_PROVIDER_SITE_OTHER): Payer: Self-pay | Admitting: Internal Medicine

## 2011-02-17 NOTE — Telephone Encounter (Signed)
I called patient to see how she was doing. She is not able to swallow soft foods without any difficulty. She is not having any chest pain. She will return for office visit in 8 weeks.

## 2011-02-18 NOTE — Telephone Encounter (Signed)
8 week f/u has been scheduled for 04/14/11 at 10:30 am.

## 2011-02-19 DIAGNOSIS — Z85038 Personal history of other malignant neoplasm of large intestine: Secondary | ICD-10-CM | POA: Diagnosis not present

## 2011-02-19 DIAGNOSIS — Z7982 Long term (current) use of aspirin: Secondary | ICD-10-CM | POA: Diagnosis not present

## 2011-02-19 DIAGNOSIS — IMO0001 Reserved for inherently not codable concepts without codable children: Secondary | ICD-10-CM | POA: Diagnosis not present

## 2011-02-19 DIAGNOSIS — I2510 Atherosclerotic heart disease of native coronary artery without angina pectoris: Secondary | ICD-10-CM | POA: Diagnosis not present

## 2011-02-19 DIAGNOSIS — C679 Malignant neoplasm of bladder, unspecified: Secondary | ICD-10-CM | POA: Diagnosis not present

## 2011-02-19 DIAGNOSIS — E039 Hypothyroidism, unspecified: Secondary | ICD-10-CM | POA: Diagnosis not present

## 2011-02-19 DIAGNOSIS — C182 Malignant neoplasm of ascending colon: Secondary | ICD-10-CM | POA: Diagnosis not present

## 2011-02-19 DIAGNOSIS — Z79899 Other long term (current) drug therapy: Secondary | ICD-10-CM | POA: Diagnosis not present

## 2011-02-20 ENCOUNTER — Encounter (HOSPITAL_COMMUNITY): Payer: Self-pay | Admitting: Internal Medicine

## 2011-02-20 DIAGNOSIS — C679 Malignant neoplasm of bladder, unspecified: Secondary | ICD-10-CM | POA: Diagnosis not present

## 2011-02-20 DIAGNOSIS — Z79899 Other long term (current) drug therapy: Secondary | ICD-10-CM | POA: Diagnosis not present

## 2011-02-20 DIAGNOSIS — Z85038 Personal history of other malignant neoplasm of large intestine: Secondary | ICD-10-CM | POA: Diagnosis not present

## 2011-02-20 DIAGNOSIS — Z7982 Long term (current) use of aspirin: Secondary | ICD-10-CM | POA: Diagnosis not present

## 2011-02-20 DIAGNOSIS — C182 Malignant neoplasm of ascending colon: Secondary | ICD-10-CM | POA: Diagnosis not present

## 2011-02-20 DIAGNOSIS — IMO0001 Reserved for inherently not codable concepts without codable children: Secondary | ICD-10-CM | POA: Diagnosis not present

## 2011-02-20 DIAGNOSIS — I2510 Atherosclerotic heart disease of native coronary artery without angina pectoris: Secondary | ICD-10-CM | POA: Diagnosis not present

## 2011-02-20 DIAGNOSIS — E039 Hypothyroidism, unspecified: Secondary | ICD-10-CM | POA: Diagnosis not present

## 2011-03-14 DIAGNOSIS — Z1231 Encounter for screening mammogram for malignant neoplasm of breast: Secondary | ICD-10-CM | POA: Diagnosis not present

## 2011-03-14 DIAGNOSIS — R922 Inconclusive mammogram: Secondary | ICD-10-CM | POA: Diagnosis not present

## 2011-03-18 DIAGNOSIS — R928 Other abnormal and inconclusive findings on diagnostic imaging of breast: Secondary | ICD-10-CM | POA: Diagnosis not present

## 2011-03-18 DIAGNOSIS — N63 Unspecified lump in unspecified breast: Secondary | ICD-10-CM | POA: Diagnosis not present

## 2011-03-27 DIAGNOSIS — IMO0002 Reserved for concepts with insufficient information to code with codable children: Secondary | ICD-10-CM | POA: Diagnosis not present

## 2011-03-31 DIAGNOSIS — M545 Low back pain: Secondary | ICD-10-CM | POA: Diagnosis not present

## 2011-03-31 DIAGNOSIS — M25559 Pain in unspecified hip: Secondary | ICD-10-CM | POA: Diagnosis not present

## 2011-04-14 ENCOUNTER — Ambulatory Visit (INDEPENDENT_AMBULATORY_CARE_PROVIDER_SITE_OTHER): Payer: Medicare Other | Admitting: Internal Medicine

## 2011-04-14 ENCOUNTER — Encounter (INDEPENDENT_AMBULATORY_CARE_PROVIDER_SITE_OTHER): Payer: Self-pay | Admitting: Internal Medicine

## 2011-04-14 VITALS — BP 130/72 | HR 78 | Temp 98.6°F | Resp 14 | Ht 60.0 in | Wt 147.0 lb

## 2011-04-14 DIAGNOSIS — K220 Achalasia of cardia: Secondary | ICD-10-CM | POA: Diagnosis not present

## 2011-04-14 NOTE — Progress Notes (Signed)
Presenting complaint; Followup for dysphagia/achalasia. Subjective: Patient is 78 year old Caucasian female who was achalasia and underwent Botox injection therapy on 02/13/2011. She has some difficulty and pain for few days and took Lortab liquid. Since then she has noted improvement in her dysphagia. She only had 2 episodes of regurgitation last week after she ate at a restaurant. He has dropped her Nexium to once a day and not having any breakthrough symptoms. Her appetite is fair. Her weight has been stable. She denies nausea or vomiting. She has chronic abdominal soreness. At times she feels as if she needs to have a bowel movement but nothing comes out. There's been no change in ileostomy output. She has had surgery for colon cancer on 2 occasions. She is advised against takedown of her colostomy because of risks involved. Current Medications: Current Outpatient Prescriptions  Medication Sig Dispense Refill  . ALPRAZolam (XANAX) 0.5 MG tablet Take 0.5 mg by mouth at bedtime.        Marland Kitchen aspirin EC 325 MG tablet Take 325 mg by mouth daily.      . carvedilol (COREG) 25 MG tablet Take 0.5 tablets (12.5 mg total) by mouth 2 (two) times daily with a meal.  30 tablet  6  . digoxin (LANOXIN) 0.125 MG tablet Take 1 tablet (125 mcg total) by mouth daily.  60 tablet  5  . esomeprazole (NEXIUM) 40 MG capsule Take 40 mg by mouth daily before breakfast.       . folic acid (FOLVITE) 1 MG tablet Take 1 mg by mouth daily.        Marland Kitchen HYDROcodone-acetaminophen (VICODIN) 5-500 MG per tablet Take 1 tablet by mouth daily as needed. For pain       . levothyroxine (SYNTHROID, LEVOTHROID) 75 MCG tablet Take 75 mcg by mouth daily.       Marland Kitchen losartan-hydrochlorothiazide (HYZAAR) 100-12.5 MG per tablet Take 1 tablet by mouth daily.  30 tablet  12  . Multiple Vitamins-Minerals (EQL GUMMY ADULT) CHEW Chew 1 tablet by mouth daily.          Objective: Blood pressure 130/72, pulse 78, temperature 98.6 F (37 C), temperature  source Oral, resp. rate 14, height 5' (1.524 m), weight 147 lb (66.679 kg). Patient appears to be in no acute distress. Conjunctiva is pink. Sclera is nonicteric Oropharyngeal mucosa is normal. No neck masses or thyromegaly noted. Cardiac exam with regular rhythm normal S1 and S2. No murmur or gallop noted. Lungs are clear to auscultation. Abdomen; long Scott across upper abdomen and ileostomy is located in right mid abdomen with greenish stool in the bag. Abdomen is soft with mild generalized tenderness without organomegaly or masses.  No LE edema or clubbing noted.  Assessment: #1. Achalasia. She appears to be doing much better since she had or talks injection therapy 2 months ago. Need for further therapy will depend on her clinical course. #2. History of colon carcinoma. Initial surgery was in 1989 when she had left, colectomy. She had another primary and had right, colectomy in 2010 complicated by leak and prolonged hospitalization with abscesses leading to ileostomy. She had a flexible sigmoidoscopy along with Gastrografin study to look at excluded segment and no significant abnormality was noted.   Plan: Patient will continue Nexium at 40 mg by mouth every morning. Office visit in one year unless dysphagia recurs or she has rectal discharge.

## 2011-04-14 NOTE — Patient Instructions (Addendum)
Call if swallowing difficulty gets worse or rectal discharge noted.

## 2011-04-23 ENCOUNTER — Telehealth (INDEPENDENT_AMBULATORY_CARE_PROVIDER_SITE_OTHER): Payer: Self-pay | Admitting: *Deleted

## 2011-04-23 DIAGNOSIS — Z432 Encounter for attention to ileostomy: Secondary | ICD-10-CM | POA: Diagnosis not present

## 2011-04-23 DIAGNOSIS — C189 Malignant neoplasm of colon, unspecified: Secondary | ICD-10-CM | POA: Diagnosis not present

## 2011-04-23 DIAGNOSIS — R1310 Dysphagia, unspecified: Secondary | ICD-10-CM | POA: Diagnosis not present

## 2011-04-23 DIAGNOSIS — E119 Type 2 diabetes mellitus without complications: Secondary | ICD-10-CM | POA: Diagnosis not present

## 2011-04-23 DIAGNOSIS — K220 Achalasia of cardia: Secondary | ICD-10-CM | POA: Diagnosis not present

## 2011-04-23 NOTE — Telephone Encounter (Signed)
Nurse from [facility] called, she was in the home of Karen [name] to do an assessment of her Stoma, She states there is no yeast, does not itch,nor is the patient experiencing any pain. There is discoloration noted but per the nurse this is not anything new. They did try something different and that was applying the waffer with no paste. The nurse, Bonita Quin may be reached at [phone]. Dr. Karilyn Cota made aware.

## 2011-04-24 DIAGNOSIS — E119 Type 2 diabetes mellitus without complications: Secondary | ICD-10-CM | POA: Diagnosis not present

## 2011-04-24 DIAGNOSIS — I10 Essential (primary) hypertension: Secondary | ICD-10-CM | POA: Diagnosis not present

## 2011-04-24 DIAGNOSIS — E7800 Pure hypercholesterolemia, unspecified: Secondary | ICD-10-CM | POA: Diagnosis not present

## 2011-04-25 NOTE — Telephone Encounter (Signed)
Noted  

## 2011-04-29 DIAGNOSIS — K220 Achalasia of cardia: Secondary | ICD-10-CM | POA: Diagnosis not present

## 2011-04-29 DIAGNOSIS — R1310 Dysphagia, unspecified: Secondary | ICD-10-CM | POA: Diagnosis not present

## 2011-04-29 DIAGNOSIS — E119 Type 2 diabetes mellitus without complications: Secondary | ICD-10-CM | POA: Diagnosis not present

## 2011-04-29 DIAGNOSIS — C189 Malignant neoplasm of colon, unspecified: Secondary | ICD-10-CM | POA: Diagnosis not present

## 2011-04-29 DIAGNOSIS — Z432 Encounter for attention to ileostomy: Secondary | ICD-10-CM | POA: Diagnosis not present

## 2011-05-01 ENCOUNTER — Ambulatory Visit: Payer: Medicare Other | Admitting: Cardiology

## 2011-05-02 DIAGNOSIS — C189 Malignant neoplasm of colon, unspecified: Secondary | ICD-10-CM | POA: Diagnosis not present

## 2011-05-02 DIAGNOSIS — Z432 Encounter for attention to ileostomy: Secondary | ICD-10-CM | POA: Diagnosis not present

## 2011-05-02 DIAGNOSIS — E119 Type 2 diabetes mellitus without complications: Secondary | ICD-10-CM | POA: Diagnosis not present

## 2011-05-02 DIAGNOSIS — K220 Achalasia of cardia: Secondary | ICD-10-CM | POA: Diagnosis not present

## 2011-05-02 DIAGNOSIS — R1310 Dysphagia, unspecified: Secondary | ICD-10-CM | POA: Diagnosis not present

## 2011-05-06 ENCOUNTER — Encounter: Payer: Self-pay | Admitting: Cardiology

## 2011-05-06 ENCOUNTER — Ambulatory Visit (INDEPENDENT_AMBULATORY_CARE_PROVIDER_SITE_OTHER): Payer: Medicare Other | Admitting: Cardiology

## 2011-05-06 VITALS — BP 128/64 | HR 76 | Wt 147.0 lb

## 2011-05-06 DIAGNOSIS — M1990 Unspecified osteoarthritis, unspecified site: Secondary | ICD-10-CM | POA: Diagnosis not present

## 2011-05-06 DIAGNOSIS — E039 Hypothyroidism, unspecified: Secondary | ICD-10-CM | POA: Diagnosis not present

## 2011-05-06 DIAGNOSIS — Z432 Encounter for attention to ileostomy: Secondary | ICD-10-CM | POA: Diagnosis not present

## 2011-05-06 DIAGNOSIS — K220 Achalasia of cardia: Secondary | ICD-10-CM | POA: Diagnosis not present

## 2011-05-06 DIAGNOSIS — R300 Dysuria: Secondary | ICD-10-CM | POA: Diagnosis not present

## 2011-05-06 DIAGNOSIS — I447 Left bundle-branch block, unspecified: Secondary | ICD-10-CM

## 2011-05-06 DIAGNOSIS — M545 Low back pain: Secondary | ICD-10-CM | POA: Diagnosis not present

## 2011-05-06 DIAGNOSIS — R350 Frequency of micturition: Secondary | ICD-10-CM | POA: Diagnosis not present

## 2011-05-06 DIAGNOSIS — I471 Supraventricular tachycardia, unspecified: Secondary | ICD-10-CM | POA: Insufficient documentation

## 2011-05-06 DIAGNOSIS — R079 Chest pain, unspecified: Secondary | ICD-10-CM | POA: Diagnosis not present

## 2011-05-06 DIAGNOSIS — E119 Type 2 diabetes mellitus without complications: Secondary | ICD-10-CM | POA: Diagnosis not present

## 2011-05-06 DIAGNOSIS — I119 Hypertensive heart disease without heart failure: Secondary | ICD-10-CM | POA: Diagnosis not present

## 2011-05-06 DIAGNOSIS — C189 Malignant neoplasm of colon, unspecified: Secondary | ICD-10-CM | POA: Diagnosis not present

## 2011-05-06 DIAGNOSIS — I10 Essential (primary) hypertension: Secondary | ICD-10-CM | POA: Diagnosis not present

## 2011-05-06 DIAGNOSIS — R1310 Dysphagia, unspecified: Secondary | ICD-10-CM | POA: Diagnosis not present

## 2011-05-06 DIAGNOSIS — E7800 Pure hypercholesterolemia, unspecified: Secondary | ICD-10-CM | POA: Diagnosis not present

## 2011-05-06 DIAGNOSIS — F411 Generalized anxiety disorder: Secondary | ICD-10-CM | POA: Diagnosis not present

## 2011-05-06 MED ORDER — DIGOXIN 125 MCG PO TABS: 125.0000 ug | ORAL_TABLET | Freq: Every day | ORAL | Status: DC

## 2011-05-06 MED ORDER — NITROGLYCERIN 0.4 MG SL SUBL: 0.4000 mg | SUBLINGUAL_TABLET | SUBLINGUAL | Status: DC | PRN

## 2011-05-06 NOTE — Patient Instructions (Signed)
Your physician recommends that you continue on your current medications as directed. Please refer to the Current Medication list given to you today.  Your physician recommends that you schedule a follow-up appointment in: 3 months  

## 2011-05-06 NOTE — Progress Notes (Signed)
Karen [name] Date of Birth:  [birth year] [facility] [address] [place], [place]  [address] [phone]         Fax   [phone]  History of Present Illness: This pleasant 78 year old woman is seen for a scheduled followup office visit she has a history of known left bundle branch block.  She does not have any history of ischemic heart disease.  She had a normal coronary arteriogram in 2002.  She had a normal adenosine Cardiolite stress test in 2009.  She does have a history of exertional throat pain.  She has a history of central hypertension and she has a history of diastolic dysfunction by echo.  Her last echo was in April of 2011 showing normal systolic function with diastolic dysfunction.  She has a history of swallowing problems secondary to achalasia and has had recent successful endoscopic injections of Botox into her esophagus.  She has a remote history of having had a ileostomy present partial small bowel obstruction and intra-abdominal infection.  She has a history of hypothyroidism.  Current Outpatient Prescriptions  Medication Sig Dispense Refill  . ALPRAZolam (XANAX) 0.5 MG tablet Take 0.5 mg by mouth at bedtime.        Marland Kitchen aspirin EC 325 MG tablet Take 325 mg by mouth daily.      . carvedilol (COREG) 25 MG tablet Take 0.5 tablets (12.5 mg total) by mouth 2 (two) times daily with a meal.  30 tablet  6  . digoxin (LANOXIN) 0.125 MG tablet Take 1 tablet (125 mcg total) by mouth daily.  30 tablet  11  . esomeprazole (NEXIUM) 40 MG capsule Take 40 mg by mouth daily before breakfast.       . folic acid (FOLVITE) 1 MG tablet Take 1 mg by mouth daily.        Marland Kitchen HYDROcodone-acetaminophen (VICODIN) 5-500 MG per tablet Take 1 tablet by mouth daily as needed. For pain       . levothyroxine (SYNTHROID, LEVOTHROID) 75 MCG tablet Take 75 mcg by mouth daily.       Marland Kitchen losartan-hydrochlorothiazide (HYZAAR) 100-12.5 MG per tablet Take 1 tablet by mouth daily.  30 tablet   12  . Multiple Vitamins-Minerals (EQL GUMMY ADULT) CHEW Chew 1 tablet by mouth daily.        Marland Kitchen DISCONTD: digoxin (LANOXIN) 0.125 MG tablet Take 1 tablet (125 mcg total) by mouth daily.  60 tablet  5  . nitroGLYCERIN (NITROSTAT) 0.4 MG SL tablet Place 1 tablet (0.4 mg total) under the tongue every 5 (five) minutes as needed for chest pain.  25 tablet  prn    Allergies  Allergen Reactions  . Xylocaine Other (See Comments)    tachycardia  . Lisinopril Cough  . Macrobid Other (See Comments)    Unknown   . Ramipril (Altace) Cough  . Codeine Palpitations and Other (See Comments)    Heart races  . Demerol Palpitations and Other (See Comments)    tachycardia  . Morphine And Related Palpitations    Tachycardia     Patient Active Problem List  Diagnoses  . Left bundle branch block  . Chest pain, exertional  . Benign hypertensive heart disease without heart failure  . Hypothyroid  . Dyslipidemia  . Small bowel obstruction, partial  . Dysphagia    History  Smoking status  . Former Smoker  . Quit date: 02/11/1963  Smokeless tobacco  . Never Used    History  Alcohol Use  No    Family History  Problem Relation Age of Onset  . Stroke    . Hypertension    . Heart disease Mother   . Stroke Mother   . Cancer Mother   . Arthritis Mother   . Arthritis Father   . Heart disease Father   . Cancer Father     leukemia  . Stroke Sister   . Hypertension Sister   . Other Sister     paralysis  . Heart disease Brother     bypass surgery  . Cancer Brother   . Arthritis Brother   . Stroke Sister   . Diabetes Sister   . Other Brother     stomach problems  . Other Brother     bladder    Review of Systems: Constitutional: no fever chills diaphoresis or fatigue or change in weight.  Head and neck: no hearing loss, no epistaxis, no photophobia or visual disturbance. Respiratory: No cough, shortness of breath or wheezing. Cardiovascular: No chest pain peripheral edema,  palpitations. Gastrointestinal: No abdominal distention, no abdominal pain, no change in bowel habits hematochezia or melena. Genitourinary: No dysuria, no frequency, no urgency, no nocturia. Musculoskeletal:No arthralgias, no back pain, no gait disturbance or myalgias. Neurological: No dizziness, no headaches, no numbness, no seizures, no syncope, no weakness, no tremors. Hematologic: No lymphadenopathy, no easy bruising. Psychiatric: No confusion, no hallucinations, no sleep disturbance.    Physical Exam: Filed Vitals:   05/06/11 1605  BP: 128/64  Pulse: 76   the general appearance reveals a well-developed well-nourished middle-age woman in no distress.Pupils equal and reactive.   Extraocular Movements are full.  There is no scleral icterus.  The mouth and pharynx are normal.  The neck is supple.  The carotids reveal no bruits.  The jugular venous pressure is normal.  The thyroid is not enlarged.  There is no lymphadenopathy.  The chest is clear to percussion and auscultation. There are no rales or rhonchi. Expansion of the chest is symmetrical.  The precordium is quiet.  The first heart sound is normal.  The second heart sound is physiologically split.  There is no murmur gallop rub or click.  There is no abnormal lift or heave.  The abdomen reveals ileostomy and no hepatosplenomegaly.The abdomen is soft and nontender. Bowel sounds are normal. The liver and spleen are not enlarged. There Are no abdominal masses. There are no bruits.  The pedal pulses are good.  There is no phlebitis or edema.  There is no cyanosis or clubbing. Strength is normal and symmetrical in all extremities.  There is no lateralizing weakness.  There are no sensory deficits.  The skin is warm and dry.  There is no rash.  EKG today shows normal sinus rhythm with left bundle branch block unchanged from prior tracings  Assessment / Plan: The patient is doing well on her current regimen we will continue same  medications today.  Recheck in 3-4 months for followup office visit.  Of note is the fact that her daughter had a recent non-STEMI and was hospitalized at [facility]

## 2011-05-06 NOTE — Assessment & Plan Note (Signed)
The patient has a past history of paroxysmal supraventricular tachycardia.  These episodes have resolved since she started on digoxin 0.125 mg daily.

## 2011-05-06 NOTE — Assessment & Plan Note (Signed)
EKG today shows a left bundle branch block unchanged from prior tracings.  She has not had any dizzy spells or bradycardia arrhythmias secondary to her left bundle branch block.

## 2011-05-06 NOTE — Assessment & Plan Note (Signed)
The patient has not been having any recent substernal chest discomfort.  She does have exertional throat pain with efforts at going for a fast walk.  She has no symptoms with ordinary activity.

## 2011-05-08 ENCOUNTER — Telehealth (INDEPENDENT_AMBULATORY_CARE_PROVIDER_SITE_OTHER): Payer: Self-pay

## 2011-05-08 NOTE — Telephone Encounter (Signed)
Pt calling in b/c she has had a thick brown drainage from her rectum 4 times in the month of March. The pt has a ileostomy which is working fine. I asked the pt about the drainage being more of a mucous drainage and the pt said no this was different b/c she has had the mucous drainage before to happen from her bottom. The pt said this is thick like chocolate syrup with a bad odor. I asked the pt if she has had any bloodwork done lately to check her hgb. The pt had some labs drawn at the beginning of the month with her PCP that was normal except for her Triglycerides. I told the pt that she just needed to be seen by Dr Abbey Chatters b/c she has not been seen since last July. The first available appt with Dr Abbey Chatters is not till 06-04-11 so I told her that I would notify his nurse and him just in case if they need to do something different with her date or order any test on the pt. The pt understands if any changes Cyndra Numbers will call with the info.

## 2011-05-09 DIAGNOSIS — R1310 Dysphagia, unspecified: Secondary | ICD-10-CM | POA: Diagnosis not present

## 2011-05-09 DIAGNOSIS — K220 Achalasia of cardia: Secondary | ICD-10-CM | POA: Diagnosis not present

## 2011-05-09 DIAGNOSIS — C189 Malignant neoplasm of colon, unspecified: Secondary | ICD-10-CM | POA: Diagnosis not present

## 2011-05-09 DIAGNOSIS — E119 Type 2 diabetes mellitus without complications: Secondary | ICD-10-CM | POA: Diagnosis not present

## 2011-05-09 DIAGNOSIS — Z432 Encounter for attention to ileostomy: Secondary | ICD-10-CM | POA: Diagnosis not present

## 2011-05-16 ENCOUNTER — Telehealth (INDEPENDENT_AMBULATORY_CARE_PROVIDER_SITE_OTHER): Payer: Self-pay

## 2011-05-16 NOTE — Telephone Encounter (Signed)
Called Karen [name] today to check on her.  She said the drainage from her rectum had stopped.  She is having intermittent stomach discomfort, but it goes away.  She is emptying her ileostomy about 4xdaily.  I advised her to call us if anything changed between now and her appt on 06/04/11.

## 2011-05-31 ENCOUNTER — Other Ambulatory Visit: Payer: Self-pay | Admitting: Cardiology

## 2011-06-02 NOTE — Telephone Encounter (Signed)
Refilled generic hyzaar 

## 2011-06-04 ENCOUNTER — Ambulatory Visit (INDEPENDENT_AMBULATORY_CARE_PROVIDER_SITE_OTHER): Payer: Medicare Other | Admitting: General Surgery

## 2011-06-04 ENCOUNTER — Encounter (INDEPENDENT_AMBULATORY_CARE_PROVIDER_SITE_OTHER): Payer: Self-pay | Admitting: General Surgery

## 2011-06-04 VITALS — BP 150/72 | HR 76 | Temp 98.6°F | Resp 18 | Ht 60.0 in | Wt 146.0 lb

## 2011-06-04 DIAGNOSIS — Z932 Ileostomy status: Secondary | ICD-10-CM

## 2011-06-04 NOTE — Progress Notes (Signed)
Patient ID: Karen [name], female   DOB: 10/05/1929, 78 y.o.   MRN: [id number]  Chief Complaint  Patient presents with  . Follow-up    reck ileostomy    HPI Karen [name] is a 78 y.o. female.   HPI  She presents today complaining of having some bowel movements out of her rectum. She called on March 28 and it had 4 loose brown colored BMs per rectum. She had 2 more couple days after that. Her ileostomy is working well. She was concerned that she is having bowel movements. She still has some of her large intestine and all of her rectum left.  Past Medical History  Diagnosis Date  . CHF (congestive heart failure)     SECONDARY TO DIASTOLIC DYSFUNCTION  . DVT of deep femoral vein   . Palpitations   . History of PSVT (paroxysmal supraventricular tachycardia)   . LBBB (left bundle branch block)   . Diabetes mellitus   . Hypertension   . Abdominal adhesions   . Ileostomy present   . Colon polyp   . Heart disease   . Wears dentures   . Breast lump   . Hyperlipidemia   . Thyroid disease   . Cancer     bladder  . Anemia   . History of blood clots   . Hernia   . Blood transfusion   . Hearing loss   . Arthritis   . Colon cancer     x 2. Last 2010    Past Surgical History  Procedure Date  . Cardiac catheterization 12/10/2000    THE LEFT VENTRICLE IS MILDY DILATED. THERE IS MILD TO MODERATE DIFFUSE HYPOKINESIS WITH EF 35%  . Colon cancer surgery   . Ileostomy   . Esophagogastroduodenoscopy 02/13/2011    Procedure: ESOPHAGOGASTRODUODENOSCOPY (EGD);  Surgeon: Malissa Hippo, MD;  Location: [facility];  Service: Endoscopy;  Laterality: N/A;  1030    Family History  Problem Relation Age of Onset  . Stroke    . Hypertension    . Heart disease Mother   . Stroke Mother   . Cancer Mother     bladder  . Arthritis Mother   . Arthritis Father   . Heart disease Father   . Cancer Father     leukemia  . Stroke Sister   . Hypertension Sister   . Other Sister     paralysis    . Heart disease Brother     bypass surgery  . Cancer Brother   . Arthritis Brother   . Stroke Sister   . Diabetes Sister   . Other Brother     stomach problems  . Other Brother     bladder    Social History History  Substance Use Topics  . Smoking status: Former Smoker    Quit date: 02/11/1963  . Smokeless tobacco: Never Used  . Alcohol Use: No    Allergies  Allergen Reactions  . Lidocaine Hcl (Local Anesth.) Other (See Comments)    tachycardia  . Lisinopril Cough  . Macrobid Other (See Comments)    Unknown   . Ramipril (Altace) Cough  . Codeine Palpitations and Other (See Comments)    Heart races  . Demerol Palpitations and Other (See Comments)    tachycardia  . Morphine And Related Palpitations    Tachycardia     Current Outpatient Prescriptions  Medication Sig Dispense Refill  . ALPRAZolam (XANAX) 0.5 MG tablet Take 0.5 mg by mouth at  bedtime.        Marland Kitchen aspirin EC 325 MG tablet Take 325 mg by mouth daily.      . carvedilol (COREG) 25 MG tablet Take 0.5 tablets (12.5 mg total) by mouth 2 (two) times daily with a meal.  30 tablet  6  . digoxin (LANOXIN) 0.125 MG tablet Take 1 tablet (125 mcg total) by mouth daily.  30 tablet  11  . esomeprazole (NEXIUM) 40 MG capsule Take 40 mg by mouth daily before breakfast.       . folic acid (FOLVITE) 1 MG tablet Take 1 mg by mouth daily.        Marland Kitchen HYDROcodone-acetaminophen (VICODIN) 5-500 MG per tablet Take 1 tablet by mouth daily as needed. For pain       . levothyroxine (SYNTHROID, LEVOTHROID) 75 MCG tablet Take 75 mcg by mouth daily.       Marland Kitchen losartan-hydrochlorothiazide (HYZAAR) 100-12.5 MG per tablet TAKE 1 TABLET BY MOUTH EVERY DAY  90 tablet  3  . Multiple Vitamins-Minerals (EQL GUMMY ADULT) CHEW Chew 1 tablet by mouth daily.        . nitroGLYCERIN (NITROSTAT) 0.4 MG SL tablet Place 1 tablet (0.4 mg total) under the tongue every 5 (five) minutes as needed for chest pain.  25 tablet  prn    Review of Systems Review  of Systems  Constitutional: Negative for fever and chills.  Gastrointestinal: Positive for abdominal pain (ruq area with heavy activity).    Blood pressure 150/72, pulse 76, temperature 98.6 F (37 C), temperature source Temporal, resp. rate 18, height 5' (1.524 m), weight 146 lb (66.225 kg).  Physical Exam Physical Exam  Constitutional: She appears well-developed and well-nourished. No distress.  Abdominal: Soft. She exhibits no mass. There is no tenderness.       Right lower quadrant ileostomy putting out thick green stool. Subcostal incision with weakness in the central and right upper quadrant areas.  Genitourinary:       Digital rectal exam-normal tone; thin, brown, pasty stool is present; no masses.    Data Reviewed Dr. Patty Sermons note  Assessment    Ileostomy is working well.  I feel she is just slowly evacuating residual stool from her remaining large intestine and rectum.    Plan    I gave her some reassurance I felt this was a normal condition at this time. I told her to talk with Dr. Karilyn Cota and ask when he wanted to repeat her colonoscopy.  Return prn.       Lyriq Finerty J 06/04/2011, 12:34 PM

## 2011-06-04 NOTE — Patient Instructions (Signed)
Ask Dr. Karilyn Cota when he wants you to have your next colonoscopy.

## 2011-08-07 ENCOUNTER — Ambulatory Visit: Payer: Medicare Other | Admitting: Cardiology

## 2011-08-08 ENCOUNTER — Encounter: Payer: Self-pay | Admitting: Cardiology

## 2011-08-08 ENCOUNTER — Ambulatory Visit (INDEPENDENT_AMBULATORY_CARE_PROVIDER_SITE_OTHER): Payer: Medicare Other | Admitting: Cardiology

## 2011-08-08 VITALS — BP 108/60 | HR 72 | Ht 60.0 in | Wt 147.0 lb

## 2011-08-08 DIAGNOSIS — I447 Left bundle-branch block, unspecified: Secondary | ICD-10-CM

## 2011-08-08 DIAGNOSIS — M792 Neuralgia and neuritis, unspecified: Secondary | ICD-10-CM

## 2011-08-08 DIAGNOSIS — I119 Hypertensive heart disease without heart failure: Secondary | ICD-10-CM

## 2011-08-08 DIAGNOSIS — I471 Supraventricular tachycardia, unspecified: Secondary | ICD-10-CM

## 2011-08-08 DIAGNOSIS — IMO0002 Reserved for concepts with insufficient information to code with codable children: Secondary | ICD-10-CM | POA: Diagnosis not present

## 2011-08-08 DIAGNOSIS — Z932 Ileostomy status: Secondary | ICD-10-CM | POA: Diagnosis not present

## 2011-08-08 NOTE — Assessment & Plan Note (Signed)
Blood pressure has been remaining stable on current therapy.  She has had occasional palpitations.  She has had some left arm and left shoulder discomfort which is present essentially constantly and suggests a pinched nerve rather than coronary ischemia.

## 2011-08-08 NOTE — Assessment & Plan Note (Signed)
The patient has had past history of paroxysmal supraventricular tachycardia but has had no recent episodes since being on chronic low-dose digoxin

## 2011-08-08 NOTE — Assessment & Plan Note (Signed)
The patient has been having some discomfort around the ileostomy stoma.  Her oncologist Dr. Lafayette Dragon has ordered a CT scan of the abdomen to be done on 08/27/11.  Her surgeon however has indicated that even if a hernia is found that she would be a very high risk for any further intra-abdominal surgical procedures.

## 2011-08-08 NOTE — Patient Instructions (Addendum)
Your physician recommends that you continue on your current medications as directed. Please refer to the Current Medication list given to you today. Your physician wants you to follow-up in: 4 months You will receive a reminder letter in the mail two months in advance. If you don't receive a letter, please call our office to schedule the follow-up appointment.  

## 2011-08-08 NOTE — Assessment & Plan Note (Signed)
The patient has not had any symptoms referable to her left bundle-branch block

## 2011-08-08 NOTE — Progress Notes (Signed)
Karen [name] Date of Birth:  1929/04/03 [facility] [address] [place], [place]  [address] [phone]         Fax   [phone]  History of Present Illness: This pleasant 78 year old woman is seen for a scheduled followup office visit.  She has a complex past medical history.  She has a known left bundle branch block.  She does not have coronary disease.  She had a normal coronary arteriogram in 2002 and a normal adenosine Cardiolite stress test in 2009.  She had an echocardiogram in April 2011 showing normal systolic function with diastolic dysfunction.  He's had a past history of swallowing problems secondary to achalasia and has had successful endoscopic injections of Botox into her esophagus.  She has a remote history of a severe intra-abdominal infection resulting in the need for an ileostomy.  She has a history of hypothyroidism.  Current Outpatient Prescriptions  Medication Sig Dispense Refill  . ALPRAZolam (XANAX) 0.5 MG tablet Take 0.5 mg by mouth at bedtime.        Marland Kitchen aspirin EC 325 MG tablet Take 325 mg by mouth daily.      . carvedilol (COREG) 25 MG tablet Take 0.5 tablets (12.5 mg total) by mouth 2 (two) times daily with a meal.  30 tablet  6  . digoxin (LANOXIN) 0.125 MG tablet Take 1 tablet (125 mcg total) by mouth daily.  30 tablet  11  . esomeprazole (NEXIUM) 40 MG capsule Take 40 mg by mouth daily before breakfast.       . folic acid (FOLVITE) 1 MG tablet Take 1 mg by mouth daily.        Marland Kitchen HYDROcodone-acetaminophen (VICODIN) 5-500 MG per tablet Take 1 tablet by mouth daily as needed. For pain       . levothyroxine (SYNTHROID, LEVOTHROID) 75 MCG tablet Take 75 mcg by mouth daily.       Marland Kitchen losartan-hydrochlorothiazide (HYZAAR) 100-12.5 MG per tablet TAKE 1 TABLET BY MOUTH EVERY DAY  90 tablet  3  . Multiple Vitamins-Minerals (EQL GUMMY ADULT) CHEW Chew 1 tablet by mouth daily.        . nitroGLYCERIN (NITROSTAT) 0.4 MG SL tablet Place 1 tablet (0.4  mg total) under the tongue every 5 (five) minutes as needed for chest pain.  25 tablet  prn    Allergies  Allergen Reactions  . Lidocaine Hcl Other (See Comments)    tachycardia  . Lisinopril Cough  . Nitrofurantoin Monohyd Macro Other (See Comments)    Unknown   . Ramipril (Ramipril) Cough  . Codeine Palpitations and Other (See Comments)    Heart races  . Demerol Palpitations and Other (See Comments)    tachycardia  . Morphine And Related Palpitations    Tachycardia     Patient Active Problem List  Diagnosis  . Left bundle branch block  . Chest pain, exertional  . Benign hypertensive heart disease without heart failure  . Hypothyroid  . Dyslipidemia  . Small bowel obstruction, partial  . Dysphagia  . Paroxysmal SVT (supraventricular tachycardia)  . Ileostomy status    History  Smoking status  . Former Smoker  . Quit date: 02/11/1963  Smokeless tobacco  . Never Used    History  Alcohol Use No    Family History  Problem Relation Age of Onset  . Stroke    . Hypertension    . Heart disease Mother   . Stroke Mother   . Cancer Mother  bladder  . Arthritis Mother   . Arthritis Father   . Heart disease Father   . Cancer Father     leukemia  . Stroke Sister   . Hypertension Sister   . Other Sister     paralysis  . Heart disease Brother     bypass surgery  . Cancer Brother   . Arthritis Brother   . Stroke Sister   . Diabetes Sister   . Other Brother     stomach problems  . Other Brother     bladder    Review of Systems: Constitutional: no fever chills diaphoresis or fatigue or change in weight.  Head and neck: no hearing loss, no epistaxis, no photophobia or visual disturbance. Respiratory: No cough, shortness of breath or wheezing. Cardiovascular: No chest pain peripheral edema, palpitations. Gastrointestinal: No abdominal distention, no abdominal pain, no change in bowel habits hematochezia or melena. Genitourinary: No dysuria, no  frequency, no urgency, no nocturia. Musculoskeletal:No arthralgias, no back pain, no gait disturbance or myalgias. Neurological: No dizziness, no headaches, no numbness, no seizures, no syncope, no weakness, no tremors. Hematologic: No lymphadenopathy, no easy bruising. Psychiatric: No confusion, no hallucinations, no sleep disturbance.    Physical Exam: Filed Vitals:   08/08/11 1431  BP: 108/60  Pulse: 72   the general appearance reveals a well-developed well-nourished woman in no distress.Pupils equal and reactive.   Extraocular Movements are full.  There is no scleral icterus.  The mouth and pharynx are normal.  The neck is supple.  The carotids reveal no bruits.  The jugular venous pressure is normal.  The thyroid is not enlarged.  There is no lymphadenopathy.  The chest is clear to percussion and auscultation. There are no rales or rhonchi. Expansion of the chest is symmetrical.  The heart reveals a regular rhythm without murmur gallop rub or click.  The abdomen is mildly tender in the right lower coughing and she has an ileostomy in the right lower quadrant bowel sounds are present The pedal pulses are good.  There is no phlebitis or edema.  There is no cyanosis or clubbing. Strength is normal and symmetrical in all extremities.  There is no lateralizing weakness.  There are no sensory deficits.  The skin is warm and dry.  There is no rash.    Assessment / Plan: Continue same medication.  Recheck in 4 months for followup office visit and EKG

## 2011-08-25 ENCOUNTER — Other Ambulatory Visit (HOSPITAL_COMMUNITY)
Admission: RE | Admit: 2011-08-25 | Discharge: 2011-08-25 | Disposition: A | Discharge status: Home / Self Care | Payer: Medicare Other | Source: Ambulatory Visit | Attending: Orthopaedic Surgery | Admitting: Orthopaedic Surgery

## 2011-08-25 DIAGNOSIS — M6530 Trigger finger, unspecified finger: Secondary | ICD-10-CM | POA: Diagnosis not present

## 2011-08-25 DIAGNOSIS — D2110 Benign neoplasm of connective and other soft tissue of unspecified upper limb, including shoulder: Secondary | ICD-10-CM | POA: Diagnosis not present

## 2011-08-26 DIAGNOSIS — I10 Essential (primary) hypertension: Secondary | ICD-10-CM | POA: Diagnosis not present

## 2011-08-26 DIAGNOSIS — E119 Type 2 diabetes mellitus without complications: Secondary | ICD-10-CM | POA: Diagnosis not present

## 2011-08-26 DIAGNOSIS — H353190 Nonexudative age-related macular degeneration, unspecified eye, stage unspecified: Secondary | ICD-10-CM | POA: Diagnosis not present

## 2011-08-26 DIAGNOSIS — E7800 Pure hypercholesterolemia, unspecified: Secondary | ICD-10-CM | POA: Diagnosis not present

## 2011-08-27 DIAGNOSIS — I2510 Atherosclerotic heart disease of native coronary artery without angina pectoris: Secondary | ICD-10-CM | POA: Diagnosis not present

## 2011-08-27 DIAGNOSIS — M4000 Postural kyphosis, site unspecified: Secondary | ICD-10-CM | POA: Diagnosis not present

## 2011-08-27 DIAGNOSIS — C189 Malignant neoplasm of colon, unspecified: Secondary | ICD-10-CM | POA: Diagnosis not present

## 2011-08-27 DIAGNOSIS — N281 Cyst of kidney, acquired: Secondary | ICD-10-CM | POA: Diagnosis not present

## 2011-08-27 DIAGNOSIS — I517 Cardiomegaly: Secondary | ICD-10-CM | POA: Diagnosis not present

## 2011-08-27 DIAGNOSIS — I428 Other cardiomyopathies: Secondary | ICD-10-CM | POA: Diagnosis not present

## 2011-08-27 DIAGNOSIS — IMO0001 Reserved for inherently not codable concepts without codable children: Secondary | ICD-10-CM | POA: Diagnosis not present

## 2011-08-27 DIAGNOSIS — E039 Hypothyroidism, unspecified: Secondary | ICD-10-CM | POA: Diagnosis not present

## 2011-08-27 DIAGNOSIS — Z980 Intestinal bypass and anastomosis status: Secondary | ICD-10-CM | POA: Diagnosis not present

## 2011-08-27 DIAGNOSIS — Z85038 Personal history of other malignant neoplasm of large intestine: Secondary | ICD-10-CM | POA: Diagnosis not present

## 2011-08-28 ENCOUNTER — Encounter: Payer: Medicare Other | Admitting: Hematology and Oncology

## 2011-08-28 DIAGNOSIS — I2510 Atherosclerotic heart disease of native coronary artery without angina pectoris: Secondary | ICD-10-CM | POA: Diagnosis not present

## 2011-08-28 DIAGNOSIS — I10 Essential (primary) hypertension: Secondary | ICD-10-CM | POA: Diagnosis not present

## 2011-08-28 DIAGNOSIS — C189 Malignant neoplasm of colon, unspecified: Secondary | ICD-10-CM

## 2011-08-28 DIAGNOSIS — C679 Malignant neoplasm of bladder, unspecified: Secondary | ICD-10-CM | POA: Diagnosis not present

## 2011-09-03 DIAGNOSIS — R300 Dysuria: Secondary | ICD-10-CM | POA: Diagnosis not present

## 2011-09-03 DIAGNOSIS — E7800 Pure hypercholesterolemia, unspecified: Secondary | ICD-10-CM | POA: Diagnosis not present

## 2011-09-03 DIAGNOSIS — E781 Pure hyperglyceridemia: Secondary | ICD-10-CM | POA: Diagnosis not present

## 2011-09-03 DIAGNOSIS — N3000 Acute cystitis without hematuria: Secondary | ICD-10-CM | POA: Diagnosis not present

## 2011-09-03 DIAGNOSIS — R350 Frequency of micturition: Secondary | ICD-10-CM | POA: Diagnosis not present

## 2011-09-03 DIAGNOSIS — E039 Hypothyroidism, unspecified: Secondary | ICD-10-CM | POA: Diagnosis not present

## 2011-09-03 DIAGNOSIS — E119 Type 2 diabetes mellitus without complications: Secondary | ICD-10-CM | POA: Diagnosis not present

## 2011-09-03 DIAGNOSIS — I10 Essential (primary) hypertension: Secondary | ICD-10-CM | POA: Diagnosis not present

## 2011-09-03 DIAGNOSIS — F411 Generalized anxiety disorder: Secondary | ICD-10-CM | POA: Diagnosis not present

## 2011-09-05 ENCOUNTER — Encounter (INDEPENDENT_AMBULATORY_CARE_PROVIDER_SITE_OTHER): Payer: Self-pay | Admitting: *Deleted

## 2011-09-09 DIAGNOSIS — N6009 Solitary cyst of unspecified breast: Secondary | ICD-10-CM | POA: Diagnosis not present

## 2011-09-09 DIAGNOSIS — Z09 Encounter for follow-up examination after completed treatment for conditions other than malignant neoplasm: Secondary | ICD-10-CM | POA: Diagnosis not present

## 2011-09-09 DIAGNOSIS — N63 Unspecified lump in unspecified breast: Secondary | ICD-10-CM | POA: Diagnosis not present

## 2011-09-15 ENCOUNTER — Telehealth (INDEPENDENT_AMBULATORY_CARE_PROVIDER_SITE_OTHER): Payer: Self-pay | Admitting: *Deleted

## 2011-09-15 NOTE — Telephone Encounter (Signed)
agree

## 2011-09-15 NOTE — Telephone Encounter (Signed)
PCP/Requesting MD: Dr Cleone Slim  Name & DOB: Karen [name] 2029-04-22      Procedure: tcs  Reason/Indication:  Hx colon ca, fh crc  Has patient had this procedure before?  Flex sig 2011  If so, when, by whom and where?    Is there a family history of colon cancer?  yes  Who?  What age when diagnosed?  brother  Is patient diabetic?   Diet control      Does patient have prosthetic heart valve?  no  Do you have a pacemaker?  no  Has patient had joint replacement within last 12 months?  no  Is patient on Coumadin, Plavix and/or Aspirin? yes  Medications: asa 325 mg daily, carvedilol 25 mg  tab bid,  alprazolam 0.5 mg @ bedtime nightly, digoxin 0.125 mg daily, nexium 40 mg daily, folic acid 1 mg daily, levothyroxine 75 mg daily, losartan/hctz 100/12.5 mg  tab bid, hydrocone 5/500 mg q 6 hrs prn, multi vit bid  Allergies: bactrim, codiene, demerol, novacain, ambien, lisinopril, ramipril, lidocaine, morphine ?  Medication Adjustment: asa 2 days  Procedure date & time: 09/26/11 at 2:15

## 2011-09-16 ENCOUNTER — Other Ambulatory Visit (INDEPENDENT_AMBULATORY_CARE_PROVIDER_SITE_OTHER): Payer: Self-pay | Admitting: *Deleted

## 2011-09-16 DIAGNOSIS — Z800 Family history of malignant neoplasm of digestive organs: Secondary | ICD-10-CM

## 2011-09-16 DIAGNOSIS — Z85038 Personal history of other malignant neoplasm of large intestine: Secondary | ICD-10-CM

## 2011-09-19 ENCOUNTER — Encounter (HOSPITAL_COMMUNITY): Payer: Self-pay | Admitting: Pharmacy Technician

## 2011-09-26 ENCOUNTER — Telehealth: Payer: Self-pay | Admitting: Cardiology

## 2011-09-26 ENCOUNTER — Encounter (HOSPITAL_COMMUNITY): Payer: Self-pay | Admitting: *Deleted

## 2011-09-26 ENCOUNTER — Ambulatory Visit (HOSPITAL_COMMUNITY)
Admission: RE | Admit: 2011-09-26 | Discharge: 2011-09-26 | Disposition: A | Discharge status: Home / Self Care | Payer: Medicare Other | Source: Ambulatory Visit | Attending: Internal Medicine | Admitting: Internal Medicine

## 2011-09-26 ENCOUNTER — Encounter (HOSPITAL_COMMUNITY)
Admission: RE | Disposition: A | Discharge status: Home / Self Care | Payer: Self-pay | Source: Ambulatory Visit | Attending: Internal Medicine

## 2011-09-26 DIAGNOSIS — E119 Type 2 diabetes mellitus without complications: Secondary | ICD-10-CM | POA: Diagnosis not present

## 2011-09-26 DIAGNOSIS — Z85038 Personal history of other malignant neoplasm of large intestine: Secondary | ICD-10-CM | POA: Insufficient documentation

## 2011-09-26 DIAGNOSIS — I10 Essential (primary) hypertension: Secondary | ICD-10-CM | POA: Diagnosis not present

## 2011-09-26 DIAGNOSIS — Z79899 Other long term (current) drug therapy: Secondary | ICD-10-CM | POA: Insufficient documentation

## 2011-09-26 DIAGNOSIS — Z800 Family history of malignant neoplasm of digestive organs: Secondary | ICD-10-CM

## 2011-09-26 DIAGNOSIS — Z932 Ileostomy status: Secondary | ICD-10-CM | POA: Insufficient documentation

## 2011-09-26 HISTORY — PX: COLONOSCOPY: SHX5424

## 2011-09-26 SURGERY — COLONOSCOPY
Anesthesia: Moderate Sedation

## 2011-09-26 MED ORDER — SODIUM CHLORIDE 0.9 % IV SOLN: INTRAVENOUS | Status: DC

## 2011-09-26 MED ORDER — SODIUM CHLORIDE 0.45 % IV SOLN
Freq: Once | INTRAVENOUS | Status: AC
  Administered 2011-09-26: 14:00:00 via INTRAVENOUS

## 2011-09-26 MED ORDER — FENTANYL CITRATE 0.05 MG/ML IJ SOLN
INTRAMUSCULAR | Status: AC
  Filled 2011-09-26: qty 2

## 2011-09-26 MED ORDER — MIDAZOLAM HCL 5 MG/5ML IJ SOLN
INTRAMUSCULAR | Status: DC | PRN
  Administered 2011-09-26: 2 mg via INTRAVENOUS
  Administered 2011-09-26: 1 mg via INTRAVENOUS
  Administered 2011-09-26: 2 mg via INTRAVENOUS

## 2011-09-26 MED ORDER — MIDAZOLAM HCL 5 MG/5ML IJ SOLN
INTRAMUSCULAR | Status: AC
  Filled 2011-09-26: qty 10

## 2011-09-26 MED ORDER — FENTANYL CITRATE 0.05 MG/ML IJ SOLN
INTRAMUSCULAR | Status: DC | PRN
  Administered 2011-09-26 (×3): 25 ug via INTRAVENOUS

## 2011-09-26 MED ORDER — STERILE WATER FOR IRRIGATION IR SOLN
Status: DC | PRN
  Administered 2011-09-26: 15:00:00

## 2011-09-26 NOTE — Telephone Encounter (Signed)
Advised patient and she will call back next week with update

## 2011-09-26 NOTE — Telephone Encounter (Signed)
Feeling bloated feeling and unable to get a deep breath last couple of nights.  Under a lot of stress.  Has history of fluid retention about 3 years ago. Does sleep on 2 pillows.  Is going for colonoscopy today. Husband has some lasix and wanted to use some. Offered appointment for Monday with Lawson Fiscal NP but would like to try something first.  Depends a ride to [place].  Call back before 12:30

## 2011-09-26 NOTE — H&P (Signed)
Karen [name] is an 78 y.o. female.   Chief Complaint: Patient is here for colonoscopy. HPI: Patient is 78 year old Caucasian female with history of colon carcinoma. Her initial surgery was in 66s and she had second primary 3 years ago. She had anastomotic leak and abscesses. She finally and tablet ileostomy. Her last colonoscopy a flexible sigmoidoscopy was in September 2011 followed by a Gastrografin enema. She is now returning for surveillance examination. He denies rectal bleeding. She still has sporadic stool per rectum. She also has history of achalasia and underwent Botox injection months ago and is still able to swallow well.  Past Medical History  Diagnosis Date  . CHF (congestive heart failure)     SECONDARY TO DIASTOLIC DYSFUNCTION  . DVT of deep femoral vein   . Palpitations   . History of PSVT (paroxysmal supraventricular tachycardia)   . LBBB (left bundle branch block)   . Diabetes mellitus   . Hypertension   . Abdominal adhesions   . Ileostomy present   . Colon polyp   . Heart disease   . Wears dentures   . Breast lump   . Hyperlipidemia   . Thyroid disease   . Cancer     bladder  . Anemia   . History of blood clots   . Hernia   . Blood transfusion   . Hearing loss   . Arthritis   . Colon cancer     x 2. Last 2010    Past Surgical History  Procedure Date  . Cardiac catheterization 12/10/2000    THE LEFT VENTRICLE IS MILDY DILATED. THERE IS MILD TO MODERATE DIFFUSE HYPOKINESIS WITH EF 35%  . Colon cancer surgery   . Ileostomy   . Esophagogastroduodenoscopy 02/13/2011    Procedure: ESOPHAGOGASTRODUODENOSCOPY (EGD);  Surgeon: Karen Hippo, MD;  Location: [facility];  Service: Endoscopy;  Laterality: N/A;  1030    Family History  Problem Relation Age of Onset  . Stroke    . Hypertension    . Heart disease Mother   . Stroke Mother   . Cancer Mother     bladder  . Arthritis Mother   . Arthritis Father   . Heart disease Father   . Cancer Father       leukemia  . Stroke Sister   . Hypertension Sister   . Other Sister     paralysis  . Heart disease Brother     bypass surgery  . Cancer Brother   . Arthritis Brother   . Stroke Sister   . Diabetes Sister   . Other Brother     stomach problems  . Other Brother     bladder   Social History:  reports that she quit smoking about 48 years ago. She has never used smokeless tobacco. She reports that she does not drink alcohol or use illicit drugs.  Allergies:  Allergies  Allergen Reactions  . Bactrim (Sulfamethoxazole W-Trimethoprim) Shortness Of Breath and Other (See Comments)    Hurting all over  . Lidocaine Hcl Other (See Comments)    tachycardia  . Lisinopril Cough  . Nitrofurantoin Monohyd Macro Other (See Comments)    Unknown   . Ramipril (Ramipril) Cough  . Codeine Palpitations and Other (See Comments)    Heart races  . Demerol Palpitations and Other (See Comments)    tachycardia  . Morphine And Related Palpitations    Tachycardia     Medications Prior to Admission  Medication Sig Dispense Refill  .  ALPRAZolam (XANAX) 0.5 MG tablet Take 0.5 mg by mouth at bedtime.       Marland Kitchen aspirin EC 325 MG tablet Take 325 mg by mouth daily.      . carvedilol (COREG) 25 MG tablet Take 0.5 tablets (12.5 mg total) by mouth 2 (two) times daily with a meal.  30 tablet  6  . digoxin (LANOXIN) 0.125 MG tablet Take 1 tablet (125 mcg total) by mouth daily.  30 tablet  11  . esomeprazole (NEXIUM) 40 MG capsule Take 40 mg by mouth daily before breakfast.       . folic acid (FOLVITE) 1 MG tablet Take 1 mg by mouth daily.        Marland Kitchen HYDROcodone-acetaminophen (VICODIN) 5-500 MG per tablet Take 1 tablet by mouth every 6 (six) hours as needed. For pain       . levothyroxine (SYNTHROID, LEVOTHROID) 75 MCG tablet Take 75 mcg by mouth daily.       Marland Kitchen losartan-hydrochlorothiazide (HYZAAR) 100-12.5 MG per tablet       . Multiple Vitamins-Minerals (EQL GUMMY ADULT) CHEW Chew 2 tablets by mouth daily.        . nitroGLYCERIN (NITROSTAT) 0.4 MG SL tablet Place 0.4 mg under the tongue every 5 (five) minutes as needed. For chest pain        No results found for this or any previous visit (from the past 48 hour(s)). No results found.  ROS  Blood pressure 168/76, temperature 97.7 F (36.5 C), temperature source Oral, resp. rate 16, height 4\' 11"  (1.499 m), weight 146 lb (66.225 kg), SpO2 71.00%. Physical Exam  Constitutional: She is oriented to person, place, and time. She appears well-developed and well-nourished.  HENT:  Mouth/Throat: Oropharynx is clear and moist.  Eyes: Conjunctivae are normal. No scleral icterus.  Neck: No thyromegaly present.  Cardiovascular: Normal rate, regular rhythm and normal heart sounds.   No murmur heard. GI: Soft. She exhibits no mass. There is Tenderness: mild tenderness at LLQ.Marland Kitchen       Ileostomy at right lower quadrant  Musculoskeletal: She exhibits no edema.  Lymphadenopathy:    She has no cervical adenopathy.  Neurological: She is alert and oriented to person, place, and time.     Assessment/Plan History of colon carcinoma. Colonoscopy to examine residual large bowel extending from rectum to distal transverse colon.  Karen [name] U 09/26/2011, 3:11 PM

## 2011-09-26 NOTE — Telephone Encounter (Signed)
Okay for her to try her husband's Lasix and see if that might help

## 2011-09-26 NOTE — Op Note (Signed)
COLONOSCOPY PROCEDURE REPORT  PATIENT:  Karen [name]  MR#:  [id number] Birthdate:  Jun 12, 1929, 78 y.o., female Endoscopist:  Dr. Malissa Hippo, MD Referred By:  Dr. Margo Common, MD Procedure Date: 09/26/2011  Procedure:   Colonoscopy(incomplete).  Indications: Patient is 78 year old Caucasian female with history of colon carcinoma(initially in 53s and second primary in 2010) who is undergoing surveillance examination. She has ileostomy. She still has rectum, sigmoid colon, descending and distal transverse colon. She is felt to be too high risk for reversal of ileostomy.  Informed Consent:  The procedure and risks were reviewed with the patient and informed consent was obtained.  Medications:  Fentanyl 75 mcg IV Versed 5 mg IV  Description of procedure: Procedure performed endoscopy suite. Patient's vital signs and O2 sat were monitored during the procedure and remained stable. Patient was placed in left lateral position rectal examination performed. No abnormality noted an external addition exam. Pediatric colonoscope was placed in the rectum where remnants of inspissated dried stool noted. Scope was passed in the sigmoid colon which was noncompliant tortuous with fecal matter. Therefore unable to advance scope to descending colon. Multiple stool fragments were removed using Roth net. Anorectal junction was unremarkable.  Findings:   Normal mucosa of rectum and sigmoid colon. Descending colon and splenic flexure could not be examined. Fecal matter removed from sigmoid colon and rectum.  Therapeutic/Diagnostic Maneuvers Performed:  See above  Complications:  None  Cecal Withdrawal Time:  NA minutes  Impression:  Examination limited to sigmoidoscopy. Unable to advance scope and splenic flexure as she still has fecal matter in the colon and sigmoid colon is very noncompliant and tortuous. Most of the fecal matter was removed using Roth net.  Recommendations:  Standard instructions  given. Patient is agreeable we'll consider either Gastrografin enema or virtual colonoscopy.  Saul Dorsi U  09/26/2011 3:53 PM  CC: Dr. Estanislado Pandy, MD & Dr. Bonnetta Barry ref. provider found        Dr. Margo Common, MD

## 2011-09-26 NOTE — Telephone Encounter (Signed)
New msg Pt called and said she has some sob and some swelling. Please call

## 2011-09-29 ENCOUNTER — Telehealth (INDEPENDENT_AMBULATORY_CARE_PROVIDER_SITE_OTHER): Payer: Self-pay | Admitting: *Deleted

## 2011-09-29 NOTE — Telephone Encounter (Signed)
Do I need to schedule patient for Gastrografin enema or virtual colonoscopy?

## 2011-10-01 DIAGNOSIS — Q438 Other specified congenital malformations of intestine: Secondary | ICD-10-CM

## 2011-10-01 DIAGNOSIS — Z85038 Personal history of other malignant neoplasm of large intestine: Secondary | ICD-10-CM | POA: Diagnosis not present

## 2011-10-02 ENCOUNTER — Encounter (HOSPITAL_COMMUNITY): Payer: Self-pay | Admitting: Internal Medicine

## 2011-11-04 DIAGNOSIS — M47812 Spondylosis without myelopathy or radiculopathy, cervical region: Secondary | ICD-10-CM | POA: Diagnosis not present

## 2011-11-04 DIAGNOSIS — M542 Cervicalgia: Secondary | ICD-10-CM | POA: Diagnosis not present

## 2011-11-05 ENCOUNTER — Other Ambulatory Visit: Payer: Self-pay | Admitting: Neurosurgery

## 2011-11-05 DIAGNOSIS — M542 Cervicalgia: Secondary | ICD-10-CM

## 2011-11-10 ENCOUNTER — Encounter (HOSPITAL_COMMUNITY): Payer: Self-pay | Admitting: Pharmacy Technician

## 2011-11-10 DIAGNOSIS — H2510 Age-related nuclear cataract, unspecified eye: Secondary | ICD-10-CM | POA: Diagnosis not present

## 2011-11-10 DIAGNOSIS — Z23 Encounter for immunization: Secondary | ICD-10-CM | POA: Diagnosis not present

## 2011-11-10 DIAGNOSIS — H31019 Macula scars of posterior pole (postinflammatory) (post-traumatic), unspecified eye: Secondary | ICD-10-CM | POA: Diagnosis not present

## 2011-11-10 DIAGNOSIS — H35379 Puckering of macula, unspecified eye: Secondary | ICD-10-CM | POA: Diagnosis not present

## 2011-11-10 DIAGNOSIS — H53029 Refractive amblyopia, unspecified eye: Secondary | ICD-10-CM | POA: Diagnosis not present

## 2011-11-11 ENCOUNTER — Other Ambulatory Visit: Payer: Self-pay | Admitting: Neurosurgery

## 2011-11-11 ENCOUNTER — Encounter (HOSPITAL_COMMUNITY): Payer: Self-pay

## 2011-11-11 ENCOUNTER — Ambulatory Visit (INDEPENDENT_AMBULATORY_CARE_PROVIDER_SITE_OTHER): Payer: Medicare Other | Admitting: Urology

## 2011-11-11 ENCOUNTER — Encounter (HOSPITAL_COMMUNITY)
Admission: RE | Admit: 2011-11-11 | Discharge: 2011-11-11 | Disposition: A | Discharge status: Home / Self Care | Payer: Medicare Other | Source: Ambulatory Visit | Attending: Ophthalmology | Admitting: Ophthalmology

## 2011-11-11 DIAGNOSIS — Z01810 Encounter for preprocedural cardiovascular examination: Secondary | ICD-10-CM | POA: Diagnosis not present

## 2011-11-11 DIAGNOSIS — H2510 Age-related nuclear cataract, unspecified eye: Secondary | ICD-10-CM | POA: Diagnosis not present

## 2011-11-11 DIAGNOSIS — I10 Essential (primary) hypertension: Secondary | ICD-10-CM | POA: Diagnosis not present

## 2011-11-11 DIAGNOSIS — C679 Malignant neoplasm of bladder, unspecified: Secondary | ICD-10-CM | POA: Diagnosis not present

## 2011-11-11 DIAGNOSIS — Z79899 Other long term (current) drug therapy: Secondary | ICD-10-CM | POA: Diagnosis not present

## 2011-11-11 DIAGNOSIS — E119 Type 2 diabetes mellitus without complications: Secondary | ICD-10-CM | POA: Diagnosis not present

## 2011-11-11 DIAGNOSIS — Z01812 Encounter for preprocedural laboratory examination: Secondary | ICD-10-CM | POA: Diagnosis not present

## 2011-11-11 DIAGNOSIS — Z139 Encounter for screening, unspecified: Secondary | ICD-10-CM

## 2011-11-11 HISTORY — DX: Gastro-esophageal reflux disease without esophagitis: K21.9

## 2011-11-11 LAB — BASIC METABOLIC PANEL
BUN: 22 mg/dL (ref 6–23)
Calcium: 9.5 mg/dL (ref 8.4–10.5)
GFR calc Af Amer: 59 mL/min — ABNORMAL LOW (ref >90)
GFR calc non Af Amer: 51 mL/min — ABNORMAL LOW (ref >90)
Potassium: 3.3 mEq/L — ABNORMAL LOW (ref 3.5–5.1)
Sodium: 142 mEq/L (ref 135–145)

## 2011-11-11 LAB — HEMOGLOBIN AND HEMATOCRIT, BLOOD: HCT: 34.9 % — ABNORMAL LOW (ref 36.0–46.0)

## 2011-11-11 NOTE — Patient Instructions (Addendum)
Your procedure is scheduled on:  11/13/2011  Report to [facility] at  800      AM.  Call this number if you have problems the morning of surgery: [phone]   Do not eat food or drink liquids :After Midnight.      Take these medicines the morning of surgery with A SIP OF WATER:xanax,vicodin,coreg,digoxin,synthroid,hyzaar,protonix  Do not wear jewelry, make-up or nail polish.  Do not wear lotions, powders, or perfumes. You may wear deodorant.  Do not shave 48 hours prior to surgery.  Do not bring valuables to the hospital.  Contacts, dentures or bridgework may not be worn into surgery.  Leave suitcase in the car. After surgery it may be brought to your room.  For patients admitted to the hospital, checkout time is 11:00 AM the day of discharge.   Patients discharged the day of surgery will not be allowed to drive home.  :     Please read over the following fact sheets that you were given: Coughing and Deep Breathing, Surgical Site Infection Prevention, Anesthesia Post-op Instructions and Care and Recovery After Surgery    Cataract A cataract is a clouding of the lens of the eye. When a lens becomes cloudy, vision is reduced based on the degree and nature of the clouding. Many cataracts reduce vision to some degree. Some cataracts make people more near-sighted as they develop. Other cataracts increase glare. Cataracts that are ignored and become worse can sometimes look white. The white color can be seen through the pupil. CAUSES   Aging. However, cataracts may occur at any age, even in newborns.   Certain drugs.   Trauma to the eye.   Certain diseases such as diabetes.   Specific eye diseases such as chronic inflammation inside the eye or a sudden attack of a rare form of glaucoma.   Inherited or acquired medical problems.  SYMPTOMS   Gradual, progressive drop in vision in the affected eye.   Severe, rapid visual loss. This most often happens when trauma is the cause.    DIAGNOSIS  To detect a cataract, an eye doctor examines the lens. Cataracts are best diagnosed with an exam of the eyes with the pupils enlarged (dilated) by drops.  TREATMENT  For an early cataract, vision may improve by using different eyeglasses or stronger lighting. If that does not help your vision, surgery is the only effective treatment. A cataract needs to be surgically removed when vision loss interferes with your everyday activities, such as driving, reading, or watching TV. A cataract may also have to be removed if it prevents examination or treatment of another eye problem. Surgery removes the cloudy lens and usually replaces it with a substitute lens (intraocular lens, IOL).  At a time when both you and your doctor agree, the cataract will be surgically removed. If you have cataracts in both eyes, only one is usually removed at a time. This allows the operated eye to heal and be out of danger from any possible problems after surgery (such as infection or poor wound healing). In rare cases, a cataract may be doing damage to your eye. In these cases, your caregiver may advise surgical removal right away. The vast majority of people who have cataract surgery have better vision afterward. HOME CARE INSTRUCTIONS  If you are not planning surgery, you may be asked to do the following:  Use different eyeglasses.   Use stronger or brighter lighting.   Ask your eye doctor about reducing  your medicine dose or changing medicines if it is thought that a medicine caused your cataract. Changing medicines does not make the cataract go away on its own.   Become familiar with your surroundings. Poor vision can lead to injury. Avoid bumping into things on the affected side. You are at a higher risk for tripping or falling.   Exercise extreme care when driving or operating machinery.   Wear sunglasses if you are sensitive to bright light or experiencing problems with glare.  SEEK IMMEDIATE MEDICAL CARE  IF:   You have a worsening or sudden vision loss.   You notice redness, swelling, or increasing pain in the eye.   You have a fever.  Document Released: 01/27/2005 Document Revised: 01/16/2011 Document Reviewed: 09/20/2010 [facility] Patient Information 2012 [place], [place].PATIENT INSTRUCTIONS POST-ANESTHESIA  IMMEDIATELY FOLLOWING SURGERY:  Do not drive or operate machinery for the first twenty four hours after surgery.  Do not make any important decisions for twenty four hours after surgery or while taking narcotic pain medications or sedatives.  If you develop intractable nausea and vomiting or a severe headache please notify your doctor immediately.  FOLLOW-UP:  Please make an appointment with your surgeon as instructed. You do not need to follow up with anesthesia unless specifically instructed to do so.  WOUND CARE INSTRUCTIONS (if applicable):  Keep a dry clean dressing on the anesthesia/puncture wound site if there is drainage.  Once the wound has quit draining you may leave it open to air.  Generally you should leave the bandage intact for twenty four hours unless there is drainage.  If the epidural site drains for more than 36-48 hours please call the anesthesia department.  QUESTIONS?:  Please feel free to call your physician or the hospital operator if you have any questions, and they will be happy to assist you.

## 2011-11-12 ENCOUNTER — Inpatient Hospital Stay: Admission: RE | Admit: 2011-11-12 | Payer: Medicare Other | Source: Ambulatory Visit

## 2011-11-12 ENCOUNTER — Other Ambulatory Visit: Payer: Medicare Other

## 2011-11-12 MED ORDER — TETRACAINE HCL 0.5 % OP SOLN
OPHTHALMIC | Status: AC
  Filled 2011-11-12: qty 2

## 2011-11-12 MED ORDER — NEOMYCIN-POLYMYXIN-DEXAMETH 3.5-10000-0.1 OP OINT
TOPICAL_OINTMENT | OPHTHALMIC | Status: AC
  Filled 2011-11-12: qty 3.5

## 2011-11-12 MED ORDER — PHENYLEPHRINE HCL 2.5 % OP SOLN
OPHTHALMIC | Status: AC
  Filled 2011-11-12: qty 2

## 2011-11-12 MED ORDER — CYCLOPENTOLATE HCL 1 % OP SOLN
OPHTHALMIC | Status: AC
  Filled 2011-11-12: qty 2

## 2011-11-12 MED ORDER — LIDOCAINE HCL 3.5 % OP GEL
OPHTHALMIC | Status: AC
  Filled 2011-11-12: qty 5

## 2011-11-12 MED ORDER — LIDOCAINE HCL (PF) 1 % IJ SOLN
INTRAMUSCULAR | Status: AC
  Filled 2011-11-12: qty 2

## 2011-11-12 NOTE — Pre-Procedure Instructions (Signed)
Potassium 3.3. Dr Jayme Cloud notified and no orders given.

## 2011-11-13 ENCOUNTER — Ambulatory Visit (HOSPITAL_COMMUNITY)
Admission: RE | Admit: 2011-11-13 | Discharge: 2011-11-13 | Disposition: A | Discharge status: Home / Self Care | Payer: Medicare Other | Source: Ambulatory Visit | Attending: Ophthalmology | Admitting: Ophthalmology

## 2011-11-13 ENCOUNTER — Encounter (HOSPITAL_COMMUNITY): Payer: Self-pay | Admitting: Anesthesiology

## 2011-11-13 ENCOUNTER — Ambulatory Visit (HOSPITAL_COMMUNITY): Payer: Medicare Other | Admitting: Anesthesiology

## 2011-11-13 ENCOUNTER — Encounter (HOSPITAL_COMMUNITY)
Admission: RE | Disposition: A | Discharge status: Home / Self Care | Payer: Self-pay | Source: Ambulatory Visit | Attending: Ophthalmology

## 2011-11-13 ENCOUNTER — Encounter (HOSPITAL_COMMUNITY): Payer: Self-pay | Admitting: *Deleted

## 2011-11-13 DIAGNOSIS — H2510 Age-related nuclear cataract, unspecified eye: Secondary | ICD-10-CM | POA: Insufficient documentation

## 2011-11-13 DIAGNOSIS — Z79899 Other long term (current) drug therapy: Secondary | ICD-10-CM | POA: Insufficient documentation

## 2011-11-13 DIAGNOSIS — Z01810 Encounter for preprocedural cardiovascular examination: Secondary | ICD-10-CM | POA: Insufficient documentation

## 2011-11-13 DIAGNOSIS — E119 Type 2 diabetes mellitus without complications: Secondary | ICD-10-CM | POA: Insufficient documentation

## 2011-11-13 DIAGNOSIS — I10 Essential (primary) hypertension: Secondary | ICD-10-CM | POA: Diagnosis not present

## 2011-11-13 DIAGNOSIS — Z01812 Encounter for preprocedural laboratory examination: Secondary | ICD-10-CM | POA: Diagnosis not present

## 2011-11-13 DIAGNOSIS — H269 Unspecified cataract: Secondary | ICD-10-CM | POA: Diagnosis not present

## 2011-11-13 HISTORY — PX: CATARACT EXTRACTION W/PHACO: SHX586

## 2011-11-13 SURGERY — PHACOEMULSIFICATION, CATARACT, WITH IOL INSERTION
Anesthesia: Monitor Anesthesia Care | Site: Eye | Laterality: Right | Wound class: Clean

## 2011-11-13 MED ORDER — PHENYLEPHRINE HCL 2.5 % OP SOLN
1.0000 [drp] | OPHTHALMIC | Status: AC
  Administered 2011-11-13 (×3): 1 [drp] via OPHTHALMIC

## 2011-11-13 MED ORDER — TETRACAINE HCL 0.5 % OP SOLN
1.0000 [drp] | OPHTHALMIC | Status: AC
  Administered 2011-11-13 (×3): 1 [drp] via OPHTHALMIC

## 2011-11-13 MED ORDER — LIDOCAINE HCL 3.5 % OP GEL
1.0000 "application " | Freq: Once | OPHTHALMIC | Status: AC
  Administered 2011-11-13: 1 via OPHTHALMIC

## 2011-11-13 MED ORDER — MIDAZOLAM HCL 2 MG/2ML IJ SOLN
1.0000 mg | INTRAMUSCULAR | Status: DC | PRN
Start: 2011-11-13 — End: 2011-11-13
  Administered 2011-11-13: 2 mg via INTRAVENOUS

## 2011-11-13 MED ORDER — EPINEPHRINE HCL 1 MG/ML IJ SOLN
INTRAOCULAR | Status: DC | PRN
  Administered 2011-11-13: 10:00:00

## 2011-11-13 MED ORDER — PROVISC 10 MG/ML IO SOLN
INTRAOCULAR | Status: DC | PRN
  Administered 2011-11-13: 8.5 mg via INTRAOCULAR

## 2011-11-13 MED ORDER — LIDOCAINE 3.5 % OP GEL OPTIME - NO CHARGE
OPHTHALMIC | Status: DC | PRN
  Administered 2011-11-13: 2 [drp] via OPHTHALMIC

## 2011-11-13 MED ORDER — CYCLOPENTOLATE-PHENYLEPHRINE 0.2-1 % OP SOLN: 1.0000 [drp] | OPHTHALMIC | Status: DC

## 2011-11-13 MED ORDER — POVIDONE-IODINE 5 % OP SOLN
OPHTHALMIC | Status: DC | PRN
  Administered 2011-11-13: 1 via OPHTHALMIC

## 2011-11-13 MED ORDER — MIDAZOLAM HCL 2 MG/2ML IJ SOLN
INTRAMUSCULAR | Status: AC
  Filled 2011-11-13: qty 2

## 2011-11-13 MED ORDER — LIDOCAINE HCL (PF) 1 % IJ SOLN
INTRAMUSCULAR | Status: DC | PRN
  Administered 2011-11-13: .2 mL

## 2011-11-13 MED ORDER — LACTATED RINGERS IV SOLN
INTRAVENOUS | Status: DC
  Administered 2011-11-13: 1000 mL via INTRAVENOUS

## 2011-11-13 MED ORDER — BSS IO SOLN
INTRAOCULAR | Status: DC | PRN
  Administered 2011-11-13: 15 mL via INTRAOCULAR

## 2011-11-13 MED ORDER — NEOMYCIN-POLYMYXIN-DEXAMETH 0.1 % OP OINT
TOPICAL_OINTMENT | OPHTHALMIC | Status: DC | PRN
  Administered 2011-11-13: 1 via OPHTHALMIC

## 2011-11-13 MED ORDER — CYCLOPENTOLATE HCL 1 % OP SOLN
1.0000 [drp] | OPHTHALMIC | Status: AC
  Administered 2011-11-13 (×3): 1 [drp] via OPHTHALMIC

## 2011-11-13 MED ORDER — EPINEPHRINE HCL 1 MG/ML IJ SOLN
INTRAMUSCULAR | Status: AC
  Filled 2011-11-13: qty 1

## 2011-11-13 SURGICAL SUPPLY — 32 items

## 2011-11-13 NOTE — H&P (Signed)
I have reviewed the H&P, the patient was re-examined, and I have identified no interval changes in medical condition and plan of care since the history and physical of record  

## 2011-11-13 NOTE — Anesthesia Preprocedure Evaluation (Signed)
Anesthesia Evaluation  Patient identified by MRN, date of birth, ID band Patient awake    Reviewed: Allergy & Precautions, NPO status , Patient's Chart, lab work & pertinent test results  Airway Mallampati: II      Dental  (+) Edentulous Upper and Edentulous Lower   Pulmonary  breath sounds clear to auscultation        Cardiovascular hypertension, +CHF and DVT + dysrhythmias Supra Ventricular Tachycardia Rhythm:Regular     Neuro/Psych    GI/Hepatic GERD-  ,  Endo/Other  diabetesHypothyroidism   Renal/GU      Musculoskeletal   Abdominal   Peds  Hematology   Anesthesia Other Findings   Reproductive/Obstetrics                           Anesthesia Physical Anesthesia Plan  ASA: III  Anesthesia Plan: MAC   Post-op Pain Management:    Induction: Intravenous  Airway Management Planned: Nasal Cannula  Additional Equipment:   Intra-op Plan:   Post-operative Plan:   Informed Consent: I have reviewed the patients History and Physical, chart, labs and discussed the procedure including the risks, benefits and alternatives for the proposed anesthesia with the patient or authorized representative who has indicated his/her understanding and acceptance.     Plan Discussed with:   Anesthesia Plan Comments:         Anesthesia Quick Evaluation

## 2011-11-13 NOTE — Brief Op Note (Signed)
Pre-Op Dx: Cataract OD Post-Op Dx: Cataract OD Surgeon: Sirus Labrie Anesthesia: Topical with MAC Surgery: Cataract Extraction with Intraocular lens Implant OD Implant: B&L enVista Specimen: None Complications: None 

## 2011-11-13 NOTE — Op Note (Signed)
NAME:  Karen [name], Karen [name]               ACCOUNT NO.:  [id number]  MEDICAL RECORD NO.:  [id number]  LOCATION:  [facility]                          FACILITY:  [facility]  PHYSICIAN:  Susanne Greenhouse, MD       DATE OF BIRTH:  1929-12-23  DATE OF PROCEDURE:  11/13/2011 DATE OF DISCHARGE:  11/13/2011                              OPERATIVE REPORT   PREOPERATIVE DIAGNOSIS:  Nuclear cataract, right eye, diagnosis code 366.16.  POSTOPERATIVE DIAGNOSIS:  Nuclear cataract, right eye, diagnosis code 366.16.  SURGEON:  Susanne Greenhouse, MD  OPERATION PERFORMED:  Phacoemulsification with posterior chamber intraocular lens implantation, right eye.  ANESTHESIA:  Topical with IV sedation.  OPERATIVE SUMMARY:  In the preoperative area, dilating drops were placed into the right eye.  The patient was then brought into the operating room where she was placed under topical anesthesia and IV sedation.  The eye was then prepped and draped.  Beginning with a 75 blade, a paracentesis port was made at the surgeon's 2 o'clock position.  The anterior chamber was then filled with a 1% nonpreserved lidocaine solution with epinephrine.  This was followed by Viscoat to deepen the chamber.  A small fornix-based peritomy was performed superiorly.  Next, a single iris hook was placed through the limbus superiorly.  A 2.4-mm keratome blade was then used to make a clear corneal incision over the iris hook.  A bent cystotome needle and Utrata forceps were used to create a continuous tear capsulotomy.  Hydrodissection was performed using balanced salt solution on a fine cannula.  The lens nucleus was then removed using phacoemulsification in a quadrant cracking technique. The cortical material was then removed with irrigation and aspiration. The capsular bag and anterior chamber were refilled with Provisc.  The wound was widened to approximately 3 mm and a posterior chamber intraocular lens was placed into the capsular bag without  difficulty using an [employer] lens injecting system.  A single 10-0 nylon suture was then used to close the incision as well as stromal hydration. The Provisc was removed from the anterior chamber and capsular bag with irrigation and aspiration.  At this point, the wounds were tested for leak, which were negative.  The anterior chamber remained deep and stable.  The patient tolerated the procedure well.  There were no operative complications, and she awoke from topical anesthesia and IV sedation without problem.  No surgical specimens.  Prosthetic device used is a [employer]ch [employer] enVista posterior chamber lens, model MX60, power of 30.0, serial number is [id number].          ______________________________ Susanne Greenhouse, MD     KEH/MEDQ  D:  11/13/2011  T:  11/13/2011  Job:  [id number]

## 2011-11-13 NOTE — Anesthesia Postprocedure Evaluation (Signed)
  Anesthesia Post-op Note  Patient: Karen [name]  Procedure(s) Performed: Procedure(s) (LRB) with comments: CATARACT EXTRACTION PHACO AND INTRAOCULAR LENS PLACEMENT (IOC) (Right) - CDE 12.26  Patient Location: PACU and Short Stay  Anesthesia Type: MAC  Level of Consciousness: awake  Airway and Oxygen Therapy: Patient Spontanous Breathing  Post-op Pain: none  Post-op Assessment: Post-op Vital signs reviewed  Post-op Vital Signs: Reviewed and stable  Complications: No apparent anesthesia complications

## 2011-11-13 NOTE — Transfer of Care (Signed)
Immediate Anesthesia Transfer of Care Note  Patient: Karen [name]  Procedure(s) Performed: Procedure(s) (LRB) with comments: CATARACT EXTRACTION PHACO AND INTRAOCULAR LENS PLACEMENT (IOC) (Right) - CDE 12.26  Patient Location: PACU and Short Stay  Anesthesia Type: MAC  Level of Consciousness: awake  Airway & Oxygen Therapy: Patient Spontanous Breathing  Post-op Assessment: Report given to PACU RN  Post vital signs: Reviewed  Complications: No apparent anesthesia complications

## 2011-11-17 ENCOUNTER — Encounter (HOSPITAL_COMMUNITY): Payer: Self-pay | Admitting: Ophthalmology

## 2011-11-17 ENCOUNTER — Other Ambulatory Visit: Payer: Medicare Other

## 2011-11-22 DIAGNOSIS — M5030 Other cervical disc degeneration, unspecified cervical region: Secondary | ICD-10-CM | POA: Diagnosis not present

## 2011-11-22 DIAGNOSIS — M47812 Spondylosis without myelopathy or radiculopathy, cervical region: Secondary | ICD-10-CM | POA: Diagnosis not present

## 2011-11-25 DIAGNOSIS — M47812 Spondylosis without myelopathy or radiculopathy, cervical region: Secondary | ICD-10-CM | POA: Diagnosis not present

## 2011-11-25 DIAGNOSIS — M542 Cervicalgia: Secondary | ICD-10-CM | POA: Diagnosis not present

## 2011-11-26 ENCOUNTER — Encounter (HOSPITAL_COMMUNITY): Payer: Self-pay

## 2011-11-27 DIAGNOSIS — H5231 Anisometropia: Secondary | ICD-10-CM | POA: Diagnosis not present

## 2011-11-27 DIAGNOSIS — H2510 Age-related nuclear cataract, unspecified eye: Secondary | ICD-10-CM | POA: Diagnosis not present

## 2011-11-27 DIAGNOSIS — H5200 Hypermetropia, unspecified eye: Secondary | ICD-10-CM | POA: Diagnosis not present

## 2011-12-02 ENCOUNTER — Encounter (HOSPITAL_COMMUNITY): Payer: Self-pay

## 2011-12-02 ENCOUNTER — Encounter (HOSPITAL_COMMUNITY)
Admission: RE | Admit: 2011-12-02 | Discharge: 2011-12-02 | Disposition: A | Discharge status: Home / Self Care | Payer: Medicare Other | Source: Ambulatory Visit | Attending: Ophthalmology | Admitting: Ophthalmology

## 2011-12-02 DIAGNOSIS — M542 Cervicalgia: Secondary | ICD-10-CM | POA: Diagnosis not present

## 2011-12-02 MED ORDER — FENTANYL CITRATE 0.05 MG/ML IJ SOLN: 25.0000 ug | INTRAMUSCULAR | Status: DC | PRN

## 2011-12-02 MED ORDER — ONDANSETRON HCL 4 MG/2ML IJ SOLN: 4.0000 mg | Freq: Once | INTRAMUSCULAR | Status: AC | PRN

## 2011-12-02 NOTE — Progress Notes (Signed)
Pt has Lidocaine allergy listed. During work-up for preop for cataract surgery on 12/08/11 Dr. Lanae Crumbly eye drop protocol is Lidocaine drops in pre-op. This is pt's second cataract being done and her last one was done on 11/13/11 and was given the Lidocaine drops then. During pre-op phone interview, pt said she experienced no problems with her last cataract surgery, therefore the Lidocaine eyedrops were entered in as the pre-op eye drop order since she was given it last time and did fine with it.

## 2011-12-04 DIAGNOSIS — M542 Cervicalgia: Secondary | ICD-10-CM | POA: Diagnosis not present

## 2011-12-05 MED ORDER — CYCLOPENTOLATE-PHENYLEPHRINE 0.2-1 % OP SOLN
OPHTHALMIC | Status: AC
  Filled 2011-12-05: qty 2

## 2011-12-05 MED ORDER — PHENYLEPHRINE HCL 2.5 % OP SOLN
OPHTHALMIC | Status: AC
  Filled 2011-12-05: qty 2

## 2011-12-05 MED ORDER — LIDOCAINE HCL 3.5 % OP GEL
OPHTHALMIC | Status: AC
  Filled 2011-12-05: qty 5

## 2011-12-05 MED ORDER — LIDOCAINE HCL (PF) 1 % IJ SOLN
INTRAMUSCULAR | Status: AC
  Filled 2011-12-05: qty 2

## 2011-12-05 MED ORDER — NEOMYCIN-POLYMYXIN-DEXAMETH 3.5-10000-0.1 OP OINT
TOPICAL_OINTMENT | OPHTHALMIC | Status: AC
  Filled 2011-12-05: qty 3.5

## 2011-12-05 MED ORDER — TETRACAINE HCL 0.5 % OP SOLN
OPHTHALMIC | Status: AC
  Filled 2011-12-05: qty 2

## 2011-12-08 ENCOUNTER — Encounter (HOSPITAL_COMMUNITY): Payer: Self-pay

## 2011-12-08 ENCOUNTER — Ambulatory Visit (HOSPITAL_COMMUNITY): Payer: Medicare Other | Admitting: Anesthesiology

## 2011-12-08 ENCOUNTER — Encounter (HOSPITAL_COMMUNITY): Payer: Self-pay | Admitting: Anesthesiology

## 2011-12-08 ENCOUNTER — Encounter (HOSPITAL_COMMUNITY)
Admission: RE | Disposition: A | Discharge status: Home / Self Care | Payer: Self-pay | Source: Ambulatory Visit | Attending: Ophthalmology

## 2011-12-08 ENCOUNTER — Ambulatory Visit (HOSPITAL_COMMUNITY)
Admission: RE | Admit: 2011-12-08 | Discharge: 2011-12-08 | Disposition: A | Discharge status: Home / Self Care | Payer: Medicare Other | Source: Ambulatory Visit | Attending: Ophthalmology | Admitting: Ophthalmology

## 2011-12-08 DIAGNOSIS — Z01812 Encounter for preprocedural laboratory examination: Secondary | ICD-10-CM | POA: Insufficient documentation

## 2011-12-08 DIAGNOSIS — I10 Essential (primary) hypertension: Secondary | ICD-10-CM | POA: Insufficient documentation

## 2011-12-08 DIAGNOSIS — H2510 Age-related nuclear cataract, unspecified eye: Secondary | ICD-10-CM | POA: Diagnosis not present

## 2011-12-08 DIAGNOSIS — E119 Type 2 diabetes mellitus without complications: Secondary | ICD-10-CM | POA: Insufficient documentation

## 2011-12-08 DIAGNOSIS — H269 Unspecified cataract: Secondary | ICD-10-CM | POA: Diagnosis not present

## 2011-12-08 HISTORY — PX: CATARACT EXTRACTION W/PHACO: SHX586

## 2011-12-08 LAB — GLUCOSE, CAPILLARY: Glucose-Capillary: 100 mg/dL — ABNORMAL HIGH (ref 70–99)

## 2011-12-08 SURGERY — PHACOEMULSIFICATION, CATARACT, WITH IOL INSERTION
Anesthesia: Monitor Anesthesia Care | Site: Eye | Laterality: Left | Wound class: Clean

## 2011-12-08 MED ORDER — LACTATED RINGERS IV SOLN
INTRAVENOUS | Status: DC
  Administered 2011-12-08: 1000 mL via INTRAVENOUS

## 2011-12-08 MED ORDER — PROVISC 10 MG/ML IO SOLN
INTRAOCULAR | Status: DC | PRN
  Administered 2011-12-08: 8.5 mg via INTRAOCULAR

## 2011-12-08 MED ORDER — MIDAZOLAM HCL 2 MG/2ML IJ SOLN
1.0000 mg | INTRAMUSCULAR | Status: DC | PRN
  Administered 2011-12-08 (×2): 1 mg via INTRAVENOUS

## 2011-12-08 MED ORDER — EPINEPHRINE HCL 1 MG/ML IJ SOLN
INTRAOCULAR | Status: DC | PRN
  Administered 2011-12-08: 11:00:00

## 2011-12-08 MED ORDER — MIDAZOLAM HCL 2 MG/2ML IJ SOLN
INTRAMUSCULAR | Status: AC
  Filled 2011-12-08: qty 2

## 2011-12-08 MED ORDER — LIDOCAINE HCL (PF) 1 % IJ SOLN
INTRAMUSCULAR | Status: DC | PRN
  Administered 2011-12-08: .5 mL

## 2011-12-08 MED ORDER — CYCLOPENTOLATE-PHENYLEPHRINE 0.2-1 % OP SOLN
1.0000 [drp] | OPHTHALMIC | Status: AC
  Administered 2011-12-08 (×3): 1 [drp] via OPHTHALMIC

## 2011-12-08 MED ORDER — ONDANSETRON HCL 4 MG/2ML IJ SOLN: 4.0000 mg | Freq: Once | INTRAMUSCULAR | Status: DC | PRN

## 2011-12-08 MED ORDER — POVIDONE-IODINE 5 % OP SOLN
OPHTHALMIC | Status: DC | PRN
  Administered 2011-12-08: 1 via OPHTHALMIC

## 2011-12-08 MED ORDER — BSS IO SOLN
INTRAOCULAR | Status: DC | PRN
  Administered 2011-12-08: 15 mL via INTRAOCULAR

## 2011-12-08 MED ORDER — NEOMYCIN-POLYMYXIN-DEXAMETH 0.1 % OP OINT
TOPICAL_OINTMENT | OPHTHALMIC | Status: DC | PRN
  Administered 2011-12-08: 1 via OPHTHALMIC

## 2011-12-08 MED ORDER — FENTANYL CITRATE 0.05 MG/ML IJ SOLN: 25.0000 ug | INTRAMUSCULAR | Status: DC | PRN

## 2011-12-08 MED ORDER — TETRACAINE HCL 0.5 % OP SOLN
1.0000 [drp] | OPHTHALMIC | Status: AC
  Administered 2011-12-08 (×3): 1 [drp] via OPHTHALMIC

## 2011-12-08 MED ORDER — LIDOCAINE HCL 3.5 % OP GEL
1.0000 "application " | Freq: Once | OPHTHALMIC | Status: AC
  Administered 2011-12-08: 1 via OPHTHALMIC

## 2011-12-08 MED ORDER — PHENYLEPHRINE HCL 2.5 % OP SOLN
1.0000 [drp] | OPHTHALMIC | Status: AC
  Administered 2011-12-08 (×3): 1 [drp] via OPHTHALMIC

## 2011-12-08 SURGICAL SUPPLY — 31 items
CAPSULAR TENSION RING-AMO (OPHTHALMIC RELATED) IMPLANT
CLOTH BEACON ORANGE TIMEOUT ST (SAFETY) ×2 IMPLANT
EYE SHIELD UNIVERSAL CLEAR (GAUZE/BANDAGES/DRESSINGS) ×2 IMPLANT
GLOVE BIO SURGEON STRL SZ 6.5 (GLOVE) IMPLANT
GLOVE BIOGEL PI IND STRL 6.5 (GLOVE) ×1 IMPLANT
GLOVE BIOGEL PI IND STRL 7.0 (GLOVE) IMPLANT
GLOVE BIOGEL PI IND STRL 7.5 (GLOVE) IMPLANT
GLOVE BIOGEL PI INDICATOR 6.5 (GLOVE) ×1
GLOVE BIOGEL PI INDICATOR 7.0 (GLOVE)
GLOVE BIOGEL PI INDICATOR 7.5 (GLOVE)
GLOVE ECLIPSE 6.5 STRL STRAW (GLOVE) IMPLANT
GLOVE ECLIPSE 7.0 STRL STRAW (GLOVE) IMPLANT
GLOVE ECLIPSE 7.5 STRL STRAW (GLOVE) IMPLANT
GLOVE EXAM NITRILE LRG STRL (GLOVE) IMPLANT
GLOVE EXAM NITRILE MD LF STRL (GLOVE) ×2 IMPLANT
GLOVE SKINSENSE NS SZ6.5 (GLOVE)
GLOVE SKINSENSE NS SZ7.0 (GLOVE)
GLOVE SKINSENSE STRL SZ6.5 (GLOVE) IMPLANT
GLOVE SKINSENSE STRL SZ7.0 (GLOVE) IMPLANT
KIT VITRECTOMY (OPHTHALMIC RELATED) IMPLANT
PAD ARMBOARD 7.5X6 YLW CONV (MISCELLANEOUS) ×2 IMPLANT
PROC W NO LENS (INTRAOCULAR LENS)
PROC W SPEC LENS (INTRAOCULAR LENS)
PROCESS W NO LENS (INTRAOCULAR LENS) IMPLANT
PROCESS W SPEC LENS (INTRAOCULAR LENS) IMPLANT
RING MALYGIN (MISCELLANEOUS) IMPLANT
SIGHTPATH CAT PROC W REG LENS (Ophthalmic Related) ×2 IMPLANT
SYR TB 1ML LL NO SAFETY (SYRINGE) ×2 IMPLANT
TAPE CLOTH SOFT 2X10 (GAUZE/BANDAGES/DRESSINGS) ×2 IMPLANT
VISCOELASTIC ADDITIONAL (OPHTHALMIC RELATED) IMPLANT
WATER STERILE IRR 250ML POUR (IV SOLUTION) ×2 IMPLANT

## 2011-12-08 NOTE — Transfer of Care (Signed)
Immediate Anesthesia Transfer of Care Note  Patient: Karen [name]  Procedure(s) Performed: Procedure(s) (LRB) with comments: CATARACT EXTRACTION PHACO AND INTRAOCULAR LENS PLACEMENT (IOC) (Left) - CDE 15.11  Patient Location: PACU and Short Stay  Anesthesia Type:MAC  Level of Consciousness: awake, alert , oriented and patient cooperative  Airway & Oxygen Therapy: Patient Spontanous Breathing  Post-op Assessment: Report given to PACU RN, Post -op Vital signs reviewed and stable and Patient moving all extremities  Post vital signs: Reviewed and stable  Complications: No apparent anesthesia complications

## 2011-12-08 NOTE — Anesthesia Postprocedure Evaluation (Signed)
  Anesthesia Post-op Note  Patient: Karen [name]  Procedure(s) Performed: Procedure(s) (LRB) with comments: CATARACT EXTRACTION PHACO AND INTRAOCULAR LENS PLACEMENT (IOC) (Left) - CDE 15.11  Patient Location: PACU and Short Stay  Anesthesia Type: MAC   Level of Consciousness: awake, alert , oriented and patient cooperative  Airway and Oxygen Therapy: Patient Spontanous Breathing  Post-op Pain: none  Post-op Assessment: Post-op Vital signs reviewed  Post-op Vital Signs: Reviewed and stable  Complications: No apparent anesthesia complications

## 2011-12-08 NOTE — Anesthesia Preprocedure Evaluation (Signed)
Anesthesia Evaluation  Patient identified by MRN, date of birth, ID band Patient awake    Reviewed: Allergy & Precautions, NPO status , Patient's Chart, lab work & pertinent test results  Airway Mallampati: II      Dental  (+) Edentulous Upper and Edentulous Lower   Pulmonary  breath sounds clear to auscultation        Cardiovascular hypertension, +CHF and DVT + dysrhythmias Supra Ventricular Tachycardia Rhythm:Regular     Neuro/Psych    GI/Hepatic GERD-  ,  Endo/Other  diabetesHypothyroidism   Renal/GU      Musculoskeletal   Abdominal   Peds  Hematology   Anesthesia Other Findings   Reproductive/Obstetrics                           Anesthesia Physical Anesthesia Plan  ASA: III  Anesthesia Plan: MAC   Post-op Pain Management:    Induction: Intravenous  Airway Management Planned: Nasal Cannula  Additional Equipment:   Intra-op Plan:   Post-operative Plan:   Informed Consent: I have reviewed the patients History and Physical, chart, labs and discussed the procedure including the risks, benefits and alternatives for the proposed anesthesia with the patient or authorized representative who has indicated his/her understanding and acceptance.     Plan Discussed with:   Anesthesia Plan Comments:         Anesthesia Quick Evaluation  

## 2011-12-08 NOTE — H&P (Signed)
I have reviewed the H&P, the patient was re-examined, and I have identified no interval changes in medical condition and plan of care since the history and physical of record  

## 2011-12-08 NOTE — Brief Op Note (Signed)
Pre-Op Dx: Cataract OS Post-Op Dx: Cataract OS Surgeon: Semira Stoltzfus Anesthesia: Topical with MAC Surgery: Cataract Extraction with Intraocular lens Implant OS Implant: B&L enVista Specimen: None Complications: None 

## 2011-12-09 ENCOUNTER — Ambulatory Visit (INDEPENDENT_AMBULATORY_CARE_PROVIDER_SITE_OTHER): Payer: Medicare Other | Admitting: Cardiology

## 2011-12-09 ENCOUNTER — Encounter: Payer: Self-pay | Admitting: Cardiology

## 2011-12-09 VITALS — BP 116/60 | HR 73 | Ht 59.0 in | Wt 143.0 lb

## 2011-12-09 DIAGNOSIS — Z932 Ileostomy status: Secondary | ICD-10-CM | POA: Diagnosis not present

## 2011-12-09 DIAGNOSIS — I119 Hypertensive heart disease without heart failure: Secondary | ICD-10-CM

## 2011-12-09 DIAGNOSIS — I447 Left bundle-branch block, unspecified: Secondary | ICD-10-CM | POA: Diagnosis not present

## 2011-12-09 DIAGNOSIS — I471 Supraventricular tachycardia: Secondary | ICD-10-CM

## 2011-12-09 DIAGNOSIS — I498 Other specified cardiac arrhythmias: Secondary | ICD-10-CM | POA: Diagnosis not present

## 2011-12-09 NOTE — Assessment & Plan Note (Signed)
The patient remains on low-dose digoxin 0.125 mg daily.  She's had no recurrence of her paroxysmal SVT

## 2011-12-09 NOTE — Op Note (Signed)
NAME:  Karen [name], Karen [name]               ACCOUNT NO.:  [id number]  MEDICAL RECORD NO.:  [id number]  LOCATION:  [facility]                          FACILITY:  [facility]  PHYSICIAN:  Susanne Greenhouse, MD       DATE OF BIRTH:  [birth year]  DATE OF PROCEDURE:  12/08/2011 DATE OF DISCHARGE:  12/08/2011                              OPERATIVE REPORT   PREOPERATIVE DIAGNOSIS:  Nuclear cataract, left eye, diagnosis code 366.16.  POSTOPERATIVE DIAGNOSIS:  Nuclear cataract, left eye, diagnosis code 366.16.  SURGEON:  Susanne Greenhouse, MD  OPERATION PERFORMED:  Phacoemulsification with posterior chamber intraocular lens implantation, left eye.  ANESTHESIA:   Topical with IV sedation.  OPERATIVE SUMMARY:  In the preoperative area, dilating drops were placed into the left eye.  The patient was then brought into the operating room where she was placed under topical anesthesia and IV sedation.  The eye was then prepped and draped.  Beginning with a 75 blade, a paracentesis port was made at the surgeon's 2 o'clock position.  The anterior chamber was then filled with a 1% nonpreserved lidocaine solution with epinephrine.  This was followed by Viscoat to deepen the chamber.  A small fornix-based peritomy was performed superiorly.  Next, a single iris hook was placed through the limbus superiorly.  A 2.4-mm keratome blade was then used to make a clear corneal incision over the iris hook. A bent cystotome needle and Utrata forceps were used to create a continuous tear capsulotomy.  Hydrodissection was performed using balanced salt solution on a fine cannula.  The lens nucleus was then removed using phacoemulsification in a quadrant cracking technique.  The cortical material was then removed with irrigation and aspiration.  The capsular bag and anterior chamber were refilled with Provisc.  The wound was widened to approximately 3 mm and a posterior chamber intraocular lens was placed into the capsular bag without  difficulty using an [employer] lens injecting system.  A single 10-0 nylon suture was then used to close the incision as well as stromal hydration.  The Provisc was removed from the anterior chamber and capsular bag with irrigation and aspiration.  At this point, the wounds were tested for leak, which were negative.  The anterior chamber remained deep and stable.  The patient tolerated the procedure well.  There were no operative complications, and she awoke from topical anesthesia and IV sedation without problem.  No surgical specimens.  Prosthetic device used is a [employer] enVista posterior chamber lens, model MX60, power of 31.0, serial number is [id number].          ______________________________ Susanne Greenhouse, MD     KEH/MEDQ  D:  12/08/2011  T:  12/09/2011  Job:  [id number]

## 2011-12-09 NOTE — Assessment & Plan Note (Signed)
The patient complains of fatigue.  She has occasional palpitations.  She has not been experiencing any sustained chest pain.

## 2011-12-09 NOTE — Assessment & Plan Note (Signed)
EKG shows left bundle branch block unchanged.  He has not been having any dizzy spells or syncope or symptoms from her left bundle branch block

## 2011-12-09 NOTE — Patient Instructions (Addendum)
Your physician recommends that you continue on your current medications as directed. Please refer to the Current Medication list given to you today. Your physician recommends that you schedule a follow-up appointment in: 4 month 

## 2011-12-09 NOTE — Progress Notes (Signed)
Karen [name] Date of Birth:  26-May-1929 [facility] [address] [place], [place]  [address] [phone]         Fax   [phone]  History of Present Illness: This pleasant 78 year old woman is seen for a scheduled followup office visit. She has a complex past medical history. She has a known left bundle branch block. She does not have coronary disease. She had a normal coronary arteriogram in 2002 and a normal adenosine Cardiolite stress test in 2009. She had an echocardiogram in April 2011 showing normal systolic function with diastolic dysfunction. He's had a past history of swallowing problems secondary to achalasia and has had successful endoscopic injections of Botox into her esophagus. She has a remote history of a severe intra-abdominal infection resulting in the need for an ileostomy. She has a history of hypothyroidism.  Since last visit she has had no new cardiac problems.  Her recent lipid studies at her primary care providers revealed elevated triglycerides and she is now on fish oil.  Current Outpatient Prescriptions  Medication Sig Dispense Refill  . ALPRAZolam (XANAX) 0.5 MG tablet Take 0.5 mg by mouth at bedtime.       Marland Kitchen aspirin EC 325 MG tablet Take 325 mg by mouth daily.      . carvedilol (COREG) 25 MG tablet Take 0.5 tablets (12.5 mg total) by mouth 2 (two) times daily with a meal.  30 tablet  6  . digoxin (LANOXIN) 0.125 MG tablet Take 1 tablet (125 mcg total) by mouth daily.  30 tablet  11  . fish oil-omega-3 fatty acids 1000 MG capsule Take 2 g by mouth daily.      Marland Kitchen HYDROcodone-acetaminophen (VICODIN) 5-500 MG per tablet Take 1 tablet by mouth every 6 (six) hours as needed. For pain      . levothyroxine (SYNTHROID, LEVOTHROID) 75 MCG tablet Take 75 mcg by mouth daily.       Marland Kitchen losartan-hydrochlorothiazide (HYZAAR) 100-12.5 MG per tablet Take 1 tablet by mouth daily.       . Multiple Vitamins-Minerals (EQL GUMMY ADULT) CHEW Chew 2 tablets by  mouth daily.       . nitroGLYCERIN (NITROSTAT) 0.4 MG SL tablet Place 0.4 mg under the tongue every 5 (five) minutes as needed. For chest pain      . pantoprazole (PROTONIX) 40 MG tablet Take 40 mg by mouth daily.        Allergies  Allergen Reactions  . Bactrim (Sulfamethoxazole W-Trimethoprim) Shortness Of Breath and Other (See Comments)    Hurting all over  . Lidocaine Hcl Other (See Comments)    tachycardia  . Lisinopril Cough  . Nitrofurantoin Monohyd Macro Other (See Comments)    Unknown   . Ramipril (Ramipril) Cough  . Codeine Palpitations and Other (See Comments)    Heart races  . Demerol Palpitations and Other (See Comments)    tachycardia  . Morphine And Related Palpitations    Tachycardia     Patient Active Problem List  Diagnosis  . Left bundle branch block  . Chest pain, exertional  . Benign hypertensive heart disease without heart failure  . Hypothyroid  . Dyslipidemia  . Small bowel obstruction, partial  . Dysphagia  . Paroxysmal SVT (supraventricular tachycardia)  . Ileostomy status    History  Smoking status  . Former Smoker  . Quit date: 02/11/1963  Smokeless tobacco  . Never Used    History  Alcohol Use No    Family  History  Problem Relation Age of Onset  . Stroke    . Hypertension    . Heart disease Mother   . Stroke Mother   . Cancer Mother     bladder  . Arthritis Mother   . Arthritis Father   . Heart disease Father   . Cancer Father     leukemia  . Stroke Sister   . Hypertension Sister   . Other Sister     paralysis  . Heart disease Brother     bypass surgery  . Cancer Brother   . Arthritis Brother   . Stroke Sister   . Diabetes Sister   . Other Brother     stomach problems  . Other Brother     bladder    Review of Systems: Constitutional: no fever chills diaphoresis or fatigue or change in weight.  Head and neck: no hearing loss, no epistaxis, no photophobia or visual disturbance. Respiratory: No cough, shortness  of breath or wheezing. Cardiovascular: No chest pain peripheral edema, palpitations. Gastrointestinal: No abdominal distention, no abdominal pain, no change in bowel habits hematochezia or melena. Genitourinary: No dysuria, no frequency, no urgency, no nocturia. Musculoskeletal:No arthralgias, no back pain, no gait disturbance or myalgias. Neurological: No dizziness, no headaches, no numbness, no seizures, no syncope, no weakness, no tremors. Hematologic: No lymphadenopathy, no easy bruising. Psychiatric: No confusion, no hallucinations, no sleep disturbance.    Physical Exam: Filed Vitals:   12/09/11 1353  BP: 116/60  Pulse: 73   the general appearance reveals a well-developed well-nourished woman in no distress.The head and neck exam reveals pupils equal and reactive.  Extraocular movements are full.  There is no scleral icterus.  The mouth and pharynx are normal.  The neck is supple.  The carotids reveal no bruits.  The jugular venous pressure is normal.  The  thyroid is not enlarged.  There is no lymphadenopathy.  The chest is clear to percussion and auscultation.  There are no rales or rhonchi.  Expansion of the chest is symmetrical.  The precordium is quiet.  The first heart sound is normal.  The second heart sound is physiologically split.  There is no murmur gallop rub or click.  There is no abnormal lift or heave.  The abdomen is soft and nontender.  The bowel sounds are normal.  The liver and spleen are not enlarged.  There are no abdominal masses.  She has an ileostomy in the left lower quadrant There are no abdominal bruits.  Extremities reveal good pedal pulses.  There is no phlebitis or edema.  There is no cyanosis or clubbing.  Strength is normal and symmetrical in all extremities.  There is no lateralizing weakness.  There are no sensory deficits.  The skin is warm and dry.  There is no rash.   EKG shows sinus rhythm with left bundle branch block unchanged  Assessment /  Plan: Continue same medication.  Continue heart healthy diet.  Recheck in 4 months for followup office visit

## 2011-12-10 ENCOUNTER — Encounter (HOSPITAL_COMMUNITY): Payer: Self-pay | Admitting: Ophthalmology

## 2011-12-10 DIAGNOSIS — M542 Cervicalgia: Secondary | ICD-10-CM | POA: Diagnosis not present

## 2011-12-12 DIAGNOSIS — M542 Cervicalgia: Secondary | ICD-10-CM | POA: Diagnosis not present

## 2011-12-17 DIAGNOSIS — M542 Cervicalgia: Secondary | ICD-10-CM | POA: Diagnosis not present

## 2011-12-18 DIAGNOSIS — M542 Cervicalgia: Secondary | ICD-10-CM | POA: Diagnosis not present

## 2011-12-22 NOTE — Telephone Encounter (Signed)
Let us do gastrograffin enema in august next year.

## 2011-12-22 NOTE — Telephone Encounter (Signed)
Noted in compuer for Aug 2014

## 2011-12-23 DIAGNOSIS — M542 Cervicalgia: Secondary | ICD-10-CM | POA: Diagnosis not present

## 2011-12-24 DIAGNOSIS — M542 Cervicalgia: Secondary | ICD-10-CM | POA: Diagnosis not present

## 2011-12-25 DIAGNOSIS — E7800 Pure hypercholesterolemia, unspecified: Secondary | ICD-10-CM | POA: Diagnosis not present

## 2011-12-25 DIAGNOSIS — E039 Hypothyroidism, unspecified: Secondary | ICD-10-CM | POA: Diagnosis not present

## 2011-12-25 DIAGNOSIS — E119 Type 2 diabetes mellitus without complications: Secondary | ICD-10-CM | POA: Diagnosis not present

## 2011-12-25 DIAGNOSIS — I10 Essential (primary) hypertension: Secondary | ICD-10-CM | POA: Diagnosis not present

## 2012-01-01 DIAGNOSIS — E7800 Pure hypercholesterolemia, unspecified: Secondary | ICD-10-CM | POA: Diagnosis not present

## 2012-01-01 DIAGNOSIS — M545 Low back pain: Secondary | ICD-10-CM | POA: Diagnosis not present

## 2012-01-01 DIAGNOSIS — F321 Major depressive disorder, single episode, moderate: Secondary | ICD-10-CM | POA: Diagnosis not present

## 2012-01-01 DIAGNOSIS — E119 Type 2 diabetes mellitus without complications: Secondary | ICD-10-CM | POA: Diagnosis not present

## 2012-01-01 DIAGNOSIS — F411 Generalized anxiety disorder: Secondary | ICD-10-CM | POA: Diagnosis not present

## 2012-01-01 DIAGNOSIS — E781 Pure hyperglyceridemia: Secondary | ICD-10-CM | POA: Diagnosis not present

## 2012-01-01 DIAGNOSIS — I10 Essential (primary) hypertension: Secondary | ICD-10-CM | POA: Diagnosis not present

## 2012-01-01 DIAGNOSIS — M542 Cervicalgia: Secondary | ICD-10-CM | POA: Diagnosis not present

## 2012-01-01 DIAGNOSIS — E039 Hypothyroidism, unspecified: Secondary | ICD-10-CM | POA: Diagnosis not present

## 2012-01-02 DIAGNOSIS — M542 Cervicalgia: Secondary | ICD-10-CM | POA: Diagnosis not present

## 2012-01-19 DIAGNOSIS — J0100 Acute maxillary sinusitis, unspecified: Secondary | ICD-10-CM | POA: Diagnosis not present

## 2012-01-21 DIAGNOSIS — M542 Cervicalgia: Secondary | ICD-10-CM | POA: Diagnosis not present

## 2012-01-23 ENCOUNTER — Other Ambulatory Visit: Payer: Self-pay

## 2012-01-23 DIAGNOSIS — M542 Cervicalgia: Secondary | ICD-10-CM | POA: Diagnosis not present

## 2012-01-23 MED ORDER — CARVEDILOL 25 MG PO TABS: 12.5000 mg | ORAL_TABLET | Freq: Two times a day (BID) | ORAL | Status: DC

## 2012-02-11 HISTORY — PX: EYE SURGERY: SHX253

## 2012-02-13 DIAGNOSIS — M545 Low back pain: Secondary | ICD-10-CM | POA: Diagnosis not present

## 2012-02-13 DIAGNOSIS — E039 Hypothyroidism, unspecified: Secondary | ICD-10-CM | POA: Diagnosis not present

## 2012-02-13 DIAGNOSIS — F411 Generalized anxiety disorder: Secondary | ICD-10-CM | POA: Diagnosis not present

## 2012-02-13 DIAGNOSIS — IMO0001 Reserved for inherently not codable concepts without codable children: Secondary | ICD-10-CM | POA: Diagnosis not present

## 2012-02-13 DIAGNOSIS — F321 Major depressive disorder, single episode, moderate: Secondary | ICD-10-CM | POA: Diagnosis not present

## 2012-02-24 ENCOUNTER — Encounter: Payer: Medicare Other | Admitting: Internal Medicine

## 2012-02-24 DIAGNOSIS — IMO0001 Reserved for inherently not codable concepts without codable children: Secondary | ICD-10-CM | POA: Diagnosis not present

## 2012-02-24 DIAGNOSIS — I2510 Atherosclerotic heart disease of native coronary artery without angina pectoris: Secondary | ICD-10-CM | POA: Diagnosis not present

## 2012-02-24 DIAGNOSIS — I10 Essential (primary) hypertension: Secondary | ICD-10-CM | POA: Diagnosis not present

## 2012-02-24 DIAGNOSIS — M1990 Unspecified osteoarthritis, unspecified site: Secondary | ICD-10-CM | POA: Diagnosis not present

## 2012-02-24 DIAGNOSIS — C189 Malignant neoplasm of colon, unspecified: Secondary | ICD-10-CM | POA: Diagnosis not present

## 2012-02-24 DIAGNOSIS — G8929 Other chronic pain: Secondary | ICD-10-CM

## 2012-02-24 DIAGNOSIS — M549 Dorsalgia, unspecified: Secondary | ICD-10-CM | POA: Diagnosis not present

## 2012-02-24 DIAGNOSIS — C679 Malignant neoplasm of bladder, unspecified: Secondary | ICD-10-CM | POA: Diagnosis not present

## 2012-02-24 DIAGNOSIS — E039 Hypothyroidism, unspecified: Secondary | ICD-10-CM | POA: Diagnosis not present

## 2012-03-15 DIAGNOSIS — H35359 Cystoid macular degeneration, unspecified eye: Secondary | ICD-10-CM | POA: Diagnosis not present

## 2012-03-15 DIAGNOSIS — Z961 Presence of intraocular lens: Secondary | ICD-10-CM | POA: Diagnosis not present

## 2012-03-15 DIAGNOSIS — H353290 Exudative age-related macular degeneration, unspecified eye, stage unspecified: Secondary | ICD-10-CM | POA: Diagnosis not present

## 2012-03-16 DIAGNOSIS — N6009 Solitary cyst of unspecified breast: Secondary | ICD-10-CM | POA: Diagnosis not present

## 2012-03-16 DIAGNOSIS — N63 Unspecified lump in unspecified breast: Secondary | ICD-10-CM | POA: Diagnosis not present

## 2012-03-16 DIAGNOSIS — N6459 Other signs and symptoms in breast: Secondary | ICD-10-CM | POA: Diagnosis not present

## 2012-03-18 DIAGNOSIS — H35079 Retinal telangiectasis, unspecified eye: Secondary | ICD-10-CM | POA: Diagnosis not present

## 2012-03-18 DIAGNOSIS — H31029 Solar retinopathy, unspecified eye: Secondary | ICD-10-CM | POA: Diagnosis not present

## 2012-03-18 DIAGNOSIS — Z961 Presence of intraocular lens: Secondary | ICD-10-CM | POA: Diagnosis not present

## 2012-03-18 DIAGNOSIS — H35349 Macular cyst, hole, or pseudohole, unspecified eye: Secondary | ICD-10-CM | POA: Diagnosis not present

## 2012-04-02 ENCOUNTER — Ambulatory Visit (INDEPENDENT_AMBULATORY_CARE_PROVIDER_SITE_OTHER): Payer: Medicare Other | Admitting: Cardiology

## 2012-04-02 VITALS — BP 147/69 | HR 69 | Ht 60.0 in | Wt 140.0 lb

## 2012-04-02 DIAGNOSIS — R079 Chest pain, unspecified: Secondary | ICD-10-CM | POA: Diagnosis not present

## 2012-04-02 DIAGNOSIS — I119 Hypertensive heart disease without heart failure: Secondary | ICD-10-CM

## 2012-04-02 DIAGNOSIS — I471 Supraventricular tachycardia: Secondary | ICD-10-CM

## 2012-04-02 NOTE — Patient Instructions (Addendum)
Your physician recommends that you continue on your current medications as directed. Please refer to the Current Medication list given to you today.  Your physician recommends that you schedule a follow-up appointment in: 4 month ov  

## 2012-04-02 NOTE — Assessment & Plan Note (Signed)
Blood pressure is remaining stable on current therapy.  No symptoms of CHF. 

## 2012-04-02 NOTE — Assessment & Plan Note (Signed)
The patient has had previous Botox injections into her esophagus.  Her last injections were a year ago and so far her swallowing is holding up nicely.

## 2012-04-02 NOTE — Progress Notes (Signed)
Karen [name] Date of Birth:  October 04, 1929 [facility] [address] [place], [place]  [address] [phone]         Fax   [phone]  History of Present Illness: This pleasant 79 year old woman is seen for a scheduled followup office visit. She has a complex past medical history. She has a known left bundle branch block. She does not have coronary disease. She had a normal coronary arteriogram in 2002 and a normal adenosine Cardiolite stress test in 2009. She had an echocardiogram in April 2011 showing normal systolic function with diastolic dysfunction. He's had a past history of swallowing problems secondary to achalasia and has had successful endoscopic injections of Botox into her esophagus. She has a remote history of a severe intra-abdominal infection resulting in the need for an ileostomy. She has a history of hypothyroidism.   Current Outpatient Prescriptions  Medication Sig Dispense Refill  . ALPRAZolam (XANAX) 0.5 MG tablet Take 0.5 mg by mouth at bedtime.       Marland Kitchen aspirin EC 325 MG tablet Take 325 mg by mouth daily.      . carvedilol (COREG) 25 MG tablet Take 0.5 tablets (12.5 mg total) by mouth 2 (two) times daily with a meal.  30 tablet  6  . digoxin (LANOXIN) 0.125 MG tablet Take 1 tablet (125 mcg total) by mouth daily.  30 tablet  11  . fish oil-omega-3 fatty acids 1000 MG capsule Take 2 g by mouth daily.      Marland Kitchen HYDROcodone-acetaminophen (VICODIN) 5-500 MG per tablet Take 1 tablet by mouth every 6 (six) hours as needed. For pain      . levothyroxine (SYNTHROID, LEVOTHROID) 75 MCG tablet Take 75 mcg by mouth daily.       Marland Kitchen losartan-hydrochlorothiazide (HYZAAR) 100-12.5 MG per tablet Take 1 tablet by mouth daily.       . Multiple Vitamins-Minerals (EQL GUMMY ADULT) CHEW Chew 2 tablets by mouth daily.       . nitroGLYCERIN (NITROSTAT) 0.4 MG SL tablet Place 0.4 mg under the tongue every 5 (five) minutes as needed. For chest pain      . pantoprazole  (PROTONIX) 40 MG tablet Take 40 mg by mouth daily.       No current facility-administered medications for this visit.    Allergies  Allergen Reactions  . Bactrim (Sulfamethoxazole W-Trimethoprim) Shortness Of Breath and Other (See Comments)    Hurting all over  . Lidocaine Hcl Other (See Comments)    tachycardia  . Lisinopril Cough  . Nitrofurantoin Monohyd Macro Other (See Comments)    Unknown   . Ramipril (Ramipril) Cough  . Codeine Palpitations and Other (See Comments)    Heart races  . Demerol Palpitations and Other (See Comments)    tachycardia  . Morphine And Related Palpitations    Tachycardia     Patient Active Problem List  Diagnosis  . Left bundle branch block  . Chest pain, exertional  . Benign hypertensive heart disease without heart failure  . Hypothyroid  . Dyslipidemia  . Small bowel obstruction, partial  . Dysphagia  . Paroxysmal SVT (supraventricular tachycardia)  . Ileostomy status    History  Smoking status  . Former Smoker  . Quit date: 02/11/1963  Smokeless tobacco  . Never Used    History  Alcohol Use No    Family History  Problem Relation Age of Onset  . Stroke    . Hypertension    . Heart disease  Mother   . Stroke Mother   . Cancer Mother     bladder  . Arthritis Mother   . Arthritis Father   . Heart disease Father   . Cancer Father     leukemia  . Stroke Sister   . Hypertension Sister   . Other Sister     paralysis  . Heart disease Brother     bypass surgery  . Cancer Brother   . Arthritis Brother   . Stroke Sister   . Diabetes Sister   . Other Brother     stomach problems  . Other Brother     bladder    Review of Systems: Constitutional: no fever chills diaphoresis or fatigue or change in weight.  Head and neck: no hearing loss, no epistaxis, no photophobia or visual disturbance. Respiratory: No cough, shortness of breath or wheezing. Cardiovascular: No chest pain peripheral edema,  palpitations. Gastrointestinal: No abdominal distention, no abdominal pain, no change in bowel habits hematochezia or melena. Genitourinary: No dysuria, no frequency, no urgency, no nocturia. Musculoskeletal:No arthralgias, no back pain, no gait disturbance or myalgias. Neurological: No dizziness, no headaches, no numbness, no seizures, no syncope, no weakness, no tremors. Hematologic: No lymphadenopathy, no easy bruising. Psychiatric: No confusion, no hallucinations, no sleep disturbance.    Physical Exam: Filed Vitals:   04/02/12 1430  BP: 147/69  Pulse: 69   the general appearance reveals an elderly woman in no distress.The head and neck exam reveals pupils equal and reactive.  Extraocular movements are full.  There is no scleral icterus.  The mouth and pharynx are normal.  The neck is supple.  The carotids reveal no bruits.  The jugular venous pressure is normal.  The  thyroid is not enlarged.  There is no lymphadenopathy.  The chest is clear to percussion and auscultation.  There are no rales or rhonchi.  Expansion of the chest is symmetrical.  The precordium is quiet.  The first heart sound is normal.  The second heart sound is physiologically split.  There is no murmur gallop rub or click.  There is no abnormal lift or heave.  The abdomen is soft and nontender.  The bowel sounds are normal.  The liver and spleen are not enlarged.  There are no abdominal masses.  There are no abdominal bruits.  Extremities reveal good pedal pulses.  There is no phlebitis or edema.  There is no cyanosis or clubbing.  Strength is normal and symmetrical in all extremities.  There is no lateralizing weakness.  There are no sensory deficits.  The skin is warm and dry.  There is no rash.     Assessment / Plan: Continue same medication and be rechecked in 4 months for followup office visit and EKG

## 2012-04-02 NOTE — Assessment & Plan Note (Signed)
The patient reports that since we last saw her she has had occasional recurrent episodes of SVT.  She had one earlier this week.  They stop on their own without specific therapy.  If she does have one that is prolonged, she can take an extra digoxin on a when necessary basis.

## 2012-04-02 NOTE — Assessment & Plan Note (Signed)
The patient has not had any recent symptoms of exertional chest pain.

## 2012-04-07 ENCOUNTER — Encounter (INDEPENDENT_AMBULATORY_CARE_PROVIDER_SITE_OTHER): Payer: Self-pay | Admitting: *Deleted

## 2012-04-26 ENCOUNTER — Other Ambulatory Visit: Payer: Self-pay | Admitting: *Deleted

## 2012-04-26 MED ORDER — DIGOXIN 125 MCG PO TABS: 0.1250 mg | ORAL_TABLET | Freq: Every day | ORAL | Status: DC

## 2012-04-27 ENCOUNTER — Ambulatory Visit (INDEPENDENT_AMBULATORY_CARE_PROVIDER_SITE_OTHER): Payer: Medicare Other | Admitting: Internal Medicine

## 2012-04-29 ENCOUNTER — Encounter (INDEPENDENT_AMBULATORY_CARE_PROVIDER_SITE_OTHER): Payer: Self-pay | Admitting: Internal Medicine

## 2012-04-29 ENCOUNTER — Ambulatory Visit (INDEPENDENT_AMBULATORY_CARE_PROVIDER_SITE_OTHER): Payer: Medicare Other | Admitting: Internal Medicine

## 2012-04-29 VITALS — BP 106/52 | HR 70 | Ht 60.0 in | Wt 141.7 lb

## 2012-04-29 DIAGNOSIS — Z538 Procedure and treatment not carried out for other reasons: Secondary | ICD-10-CM

## 2012-04-29 DIAGNOSIS — C189 Malignant neoplasm of colon, unspecified: Secondary | ICD-10-CM

## 2012-04-29 DIAGNOSIS — C182 Malignant neoplasm of ascending colon: Secondary | ICD-10-CM | POA: Diagnosis not present

## 2012-04-29 NOTE — Progress Notes (Signed)
Subjective:     Patient ID: Karen [name], female   DOB: 12-Feb-1929, 79 y.o.   MRN: [id number]  HPIHx of colon carcinoma. Initial surgery in the 80s and had a second primary 4 yrs ago. She had an anastomotic leak and abscess. Has an ileostomy.  She tells me is doing pretty good.  She occasionally has acid reflux. Appetite for the most part is good.  No weight loss.  She is changing the ileostomy bag  She tells me her stools are very loose. No melena or bright red blood.     8./16/2013 Colonoscopy: :Examination limited to sigmoidoscopy.  Unable to advance scope and splenic flexure as she still has fecal matter in the colon and sigmoid colon is very noncompliant and tortuous.  Most of the fecal matter was removed using Roth net.      Review of Systems see hpi Current Outpatient Prescriptions  Medication Sig Dispense Refill  . ALPRAZolam (XANAX) 0.5 MG tablet Take 0.5 mg by mouth at bedtime.       Marland Kitchen amitriptyline (ELAVIL) 10 MG tablet Take 10 mg by mouth at bedtime.      . carvedilol (COREG) 25 MG tablet Take 0.5 tablets (12.5 mg total) by mouth 2 (two) times daily with a meal.  30 tablet  6  . digoxin (LANOXIN) 0.125 MG tablet Take 1 tablet (0.125 mg total) by mouth daily.  30 tablet  5  . fish oil-omega-3 fatty acids 1000 MG capsule Take 2 g by mouth daily.      Marland Kitchen HYDROcodone-acetaminophen (VICODIN) 5-500 MG per tablet Take 1 tablet by mouth every 6 (six) hours as needed. For pain      . levothyroxine (SYNTHROID, LEVOTHROID) 75 MCG tablet Take 75 mcg by mouth daily.       Marland Kitchen losartan-hydrochlorothiazide (HYZAAR) 100-12.5 MG per tablet Take 1 tablet by mouth daily.       . Multiple Vitamins-Minerals (EQL GUMMY ADULT) CHEW Chew 2 tablets by mouth daily.       . nitroGLYCERIN (NITROSTAT) 0.4 MG SL tablet Place 0.4 mg under the tongue every 5 (five) minutes as needed. For chest pain      . pantoprazole (PROTONIX) 40 MG tablet Take 40 mg by mouth daily.      Marland Kitchen aspirin EC 325 MG tablet Take  325 mg by mouth daily.       No current facility-administered medications for this visit.   Past Medical History  Diagnosis Date  . CHF (congestive heart failure)     SECONDARY TO DIASTOLIC DYSFUNCTION  . DVT of deep femoral vein   . Palpitations   . History of PSVT (paroxysmal supraventricular tachycardia)   . LBBB (left bundle branch block)   . Diabetes mellitus   . Hypertension   . Abdominal adhesions   . Ileostomy present   . Colon polyp   . Heart disease   . Wears dentures   . Breast lump   . Hyperlipidemia   . Thyroid disease   . Cancer     bladder  . Anemia   . History of blood clots   . Hernia   . Blood transfusion   . Hearing loss   . Arthritis   . Colon cancer     x 2. Last 2010  . GERD (gastroesophageal reflux disease)    Past Surgical History  Procedure Laterality Date  . Cardiac catheterization  12/10/2000    THE LEFT VENTRICLE IS MILDY DILATED. THERE IS MILD TO  MODERATE DIFFUSE HYPOKINESIS WITH EF 35%  . Colon cancer surgery    . Ileostomy    . Esophagogastroduodenoscopy  02/13/2011    Procedure: ESOPHAGOGASTRODUODENOSCOPY (EGD);  Surgeon: Malissa Hippo, MD;  [facility];  Service: Endoscopy;  Laterality: N/A;  1030  . Colonoscopy  09/26/2011    Procedure: COLONOSCOPY;  Surgeon: Malissa Hippo, MD;  [facility];  Service: Endoscopy;  Laterality: N/A;  215  . Cataract extraction w/phaco  11/13/2011    Procedure: CATARACT EXTRACTION PHACO AND INTRAOCULAR LENS PLACEMENT (IOC);  Surgeon: Gemma Payor, MD;  Location: [facility];  Service: Ophthalmology;  Laterality: Right;  CDE 12.26  . Cataract extraction w/phaco  12/08/2011    Procedure: CATARACT EXTRACTION PHACO AND INTRAOCULAR LENS PLACEMENT (IOC);  Surgeon: Gemma Payor, MD;  Location: [facility];  Service: Ophthalmology;  Laterality: Left;  CDE 15.11   Allergies  Allergen Reactions  . Bactrim (Sulfamethoxazole W-Trimethoprim) Shortness Of Breath and Other (See Comments)    Hurting all over   . Lidocaine Hcl Other (See Comments)    tachycardia  . Lisinopril Cough  . Nitrofurantoin Monohyd Macro Other (See Comments)    Unknown   . Ramipril (Ramipril) Cough  . Codeine Palpitations and Other (See Comments)    Heart races  . Demerol Palpitations and Other (See Comments)    tachycardia  . Morphine And Related Palpitations    Tachycardia         Objective:   Physical Exam Alert and oriented. Skin warm and dry. Oral mucosa is moist.   . Sclera anicteric, conjunctivae is pink. Thyroid not enlarged. No cervical lymphadenopathy. Lungs clear. Heart regular rate and rhythm.  Abdomen is soft. Bowel sounds are positive. No hepatomegaly. No abdominal masses felt. No tenderness.  No edema to lower extremities.       Assessment:     Personal hx of colon cancer. She is doing well. No GI complaints.   Colonoscopy incomplete in 2013.  Plan:    OV in 1 yr. Continue present medicatons. Will schedule a virtual colonoscopy

## 2012-04-29 NOTE — Patient Instructions (Addendum)
OV in 1 yr. Continue present medications. Will schedule a virtual colonocopy

## 2012-05-10 DIAGNOSIS — E119 Type 2 diabetes mellitus without complications: Secondary | ICD-10-CM | POA: Diagnosis not present

## 2012-05-10 DIAGNOSIS — E039 Hypothyroidism, unspecified: Secondary | ICD-10-CM | POA: Diagnosis not present

## 2012-05-10 DIAGNOSIS — I10 Essential (primary) hypertension: Secondary | ICD-10-CM | POA: Diagnosis not present

## 2012-05-10 DIAGNOSIS — E7800 Pure hypercholesterolemia, unspecified: Secondary | ICD-10-CM | POA: Diagnosis not present

## 2012-05-13 ENCOUNTER — Ambulatory Visit
Admission: RE | Admit: 2012-05-13 | Discharge: 2012-05-13 | Disposition: A | Discharge status: Home / Self Care | Payer: Medicare Other | Source: Ambulatory Visit | Attending: Internal Medicine | Admitting: Internal Medicine

## 2012-05-13 ENCOUNTER — Telehealth (INDEPENDENT_AMBULATORY_CARE_PROVIDER_SITE_OTHER): Payer: Self-pay | Admitting: Internal Medicine

## 2012-05-13 DIAGNOSIS — C189 Malignant neoplasm of colon, unspecified: Secondary | ICD-10-CM

## 2012-05-13 DIAGNOSIS — C182 Malignant neoplasm of ascending colon: Secondary | ICD-10-CM

## 2012-05-13 DIAGNOSIS — K5730 Diverticulosis of large intestine without perforation or abscess without bleeding: Secondary | ICD-10-CM | POA: Diagnosis not present

## 2012-05-13 DIAGNOSIS — K6389 Other specified diseases of intestine: Secondary | ICD-10-CM | POA: Diagnosis not present

## 2012-05-13 DIAGNOSIS — Z538 Procedure and treatment not carried out for other reasons: Secondary | ICD-10-CM

## 2012-05-13 NOTE — Telephone Encounter (Signed)
Discussed with Dr. Karilyn Cota

## 2012-05-17 ENCOUNTER — Encounter: Payer: Self-pay | Admitting: Nurse Practitioner

## 2012-05-17 DIAGNOSIS — E039 Hypothyroidism, unspecified: Secondary | ICD-10-CM | POA: Diagnosis not present

## 2012-05-17 DIAGNOSIS — M545 Low back pain: Secondary | ICD-10-CM | POA: Diagnosis not present

## 2012-05-17 DIAGNOSIS — I209 Angina pectoris, unspecified: Secondary | ICD-10-CM | POA: Diagnosis not present

## 2012-05-17 DIAGNOSIS — IMO0001 Reserved for inherently not codable concepts without codable children: Secondary | ICD-10-CM | POA: Diagnosis not present

## 2012-05-17 DIAGNOSIS — E119 Type 2 diabetes mellitus without complications: Secondary | ICD-10-CM | POA: Diagnosis not present

## 2012-05-17 DIAGNOSIS — F411 Generalized anxiety disorder: Secondary | ICD-10-CM | POA: Diagnosis not present

## 2012-05-17 DIAGNOSIS — F321 Major depressive disorder, single episode, moderate: Secondary | ICD-10-CM | POA: Diagnosis not present

## 2012-05-17 DIAGNOSIS — R072 Precordial pain: Secondary | ICD-10-CM | POA: Diagnosis not present

## 2012-05-19 ENCOUNTER — Ambulatory Visit: Payer: Medicare Other | Admitting: Physician Assistant

## 2012-05-24 ENCOUNTER — Encounter: Payer: Self-pay | Admitting: Nurse Practitioner

## 2012-05-24 ENCOUNTER — Ambulatory Visit (INDEPENDENT_AMBULATORY_CARE_PROVIDER_SITE_OTHER): Payer: Medicare Other | Admitting: Nurse Practitioner

## 2012-05-24 VITALS — BP 116/51 | HR 67 | Ht 60.0 in | Wt 142.4 lb

## 2012-05-24 DIAGNOSIS — I200 Unstable angina: Secondary | ICD-10-CM | POA: Insufficient documentation

## 2012-05-24 DIAGNOSIS — I119 Hypertensive heart disease without heart failure: Secondary | ICD-10-CM | POA: Diagnosis not present

## 2012-05-24 NOTE — Progress Notes (Signed)
Patient Name: Karen [name] Date of Encounter: 05/24/2012  Primary Care Provider:  Estanislado Pandy, MD Primary Cardiologist:  Wylene Simmer, MD  Patient Profile  79 y/o female with h/o somewhat chronic exertional throat pain, who presents after a particularly bad episode 10 days ago.  Problem List   Past Medical History  Diagnosis Date  . Chronic diastolic CHF (congestive heart failure)     a. nl EF by echo 05/2009.  Marland Kitchen DVT of deep femoral vein   . Palpitations   . History of PSVT (paroxysmal supraventricular tachycardia)   . LBBB (left bundle branch block)   . Diabetes mellitus   . Hypertension   . Abdominal adhesions   . Ileostomy present   . Colon polyp   . Chest pain     a. 2002 Cath: nl cors;  b. 2009 aden mv: nl;  c. 05/2009 Echo: nl.  . Wears dentures   . Breast lump   . Hyperlipidemia   . Thyroid disease   . Cancer     bladder  . Anemia   . History of blood clots   . Hernia   . Blood transfusion   . Hearing loss   . Arthritis   . Colon cancer     x 2. Last 2010  . GERD (gastroesophageal reflux disease)    Past Surgical History  Procedure Laterality Date  . Cardiac catheterization  12/10/2000    THE LEFT VENTRICLE IS MILDY DILATED. THERE IS MILD TO MODERATE DIFFUSE HYPOKINESIS WITH EF 35%  . Colon cancer surgery    . Ileostomy    . Esophagogastroduodenoscopy  02/13/2011    Procedure: ESOPHAGOGASTRODUODENOSCOPY (EGD);  Surgeon: Malissa Hippo, MD;  [facility];  Service: Endoscopy;  Laterality: N/A;  1030  . Colonoscopy  09/26/2011    Procedure: COLONOSCOPY;  Surgeon: Malissa Hippo, MD;  [facility];  Service: Endoscopy;  Laterality: N/A;  215  . Cataract extraction w/phaco  11/13/2011    Procedure: CATARACT EXTRACTION PHACO AND INTRAOCULAR LENS PLACEMENT (IOC);  Surgeon: Gemma Payor, MD;  Location: [facility];  Service: Ophthalmology;  Laterality: Right;  CDE 12.26  . Cataract extraction w/phaco  12/08/2011    Procedure: CATARACT  EXTRACTION PHACO AND INTRAOCULAR LENS PLACEMENT (IOC);  Surgeon: Gemma Payor, MD;  Location: [facility];  Service: Ophthalmology;  Laterality: Left;  CDE 15.11    Allergies  Allergies  Allergen Reactions  . Bactrim (Sulfamethoxazole W-Trimethoprim) Shortness Of Breath and Other (See Comments)    Hurting all over  . Lidocaine Hcl Other (See Comments)    tachycardia  . Lisinopril Cough  . Nitrofurantoin Monohyd Macro Other (See Comments)    Unknown   . Ramipril (Ramipril) Cough  . Codeine Palpitations and Other (See Comments)    Heart races  . Demerol Palpitations and Other (See Comments)    tachycardia  . Morphine And Related Palpitations    Tachycardia     HPI  79 y/o female with the above problem list.  She has a long h/o exertional throat tightness with previous nl cath and nl myoview (last 2009).  Over the past year, she has been walking less and less.  Approximately 10 days ago, she was sweeping off her front porch and developed severe throat discomfort radiating to her left subscapular area, associated with dyspnea.  She sat and took a sl ntg, with relief of Ss in about 15 mins.  She saw her PCP last week and upon mentioning  Ss, she was advised to f/u with [place].  ECG that day showed LBBB w/o acute changes.  Over this past weekend, while helping to clean up @ church, she had a brief/fleeting episode of throat discomfort, which resolved spontaneously within a few seconds.  She was able to continue cleaning w/o recurrence of Ss.  She denies pnd, orthopnea, n, v, dizziness, syncope, edema, or early satiety.  Home Medications  Prior to Admission medications   Medication Sig Start Date End Date Taking? Authorizing Provider  ALPRAZolam Prudy Feeler) 0.5 MG tablet Take 0.5 mg by mouth at bedtime.    Yes Historical Provider, MD  amitriptyline (ELAVIL) 10 MG tablet Take 10 mg by mouth at bedtime.   Yes Historical Provider, MD  aspirin EC 325 MG tablet Take 325 mg by mouth daily.   Yes Historical  Provider, MD  carvedilol (COREG) 25 MG tablet Take 0.5 tablets (12.5 mg total) by mouth 2 (two) times daily with a meal. 01/23/12  Yes Cassell Clement, MD  digoxin (LANOXIN) 0.125 MG tablet Take 1 tablet (0.125 mg total) by mouth daily. 04/26/12 04/26/13 Yes Cassell Clement, MD  fish oil-omega-3 fatty acids 1000 MG capsule Take 2 g by mouth daily.   Yes Historical Provider, MD  HYDROcodone-acetaminophen (VICODIN) 5-500 MG per tablet Take 1 tablet by mouth every 6 (six) hours as needed. For pain 12/02/10  Yes Historical Provider, MD  levothyroxine (SYNTHROID, LEVOTHROID) 75 MCG tablet Take 75 mcg by mouth daily.    Yes Historical Provider, MD  losartan-hydrochlorothiazide (HYZAAR) 100-12.5 MG per tablet Take 1 tablet by mouth daily.  05/31/11  Yes Cassell Clement, MD  nitroGLYCERIN (NITROSTAT) 0.4 MG SL tablet Place 0.4 mg under the tongue every 5 (five) minutes as needed. For chest pain 05/06/11 05/24/12 Yes Cassell Clement, MD  pantoprazole (PROTONIX) 40 MG tablet Take 40 mg by mouth daily.   Yes Historical Provider, MD  Multiple Vitamins-Minerals (EQL GUMMY ADULT) CHEW Chew 2 tablets by mouth daily.     Historical Provider, MD   Family History  Problem Relation Age of Onset  . Stroke    . Hypertension    . Heart disease Mother   . Stroke Mother     deceased  . Cancer Mother     bladder  . Arthritis Mother   . Arthritis Father   . Heart disease Father     decesaed  . Cancer Father     leukemia  . Stroke Sister     alive/debilitated  . Hypertension Sister   . Other Sister     paralysis  . Heart disease Brother     bypass surgery  . Cancer Brother   . Arthritis Brother   . Stroke Sister     alive/debilitated  . Diabetes Sister   . Other Brother     stomach problems  . Other Brother     bladder   History   Social History  . Marital Status: Married    Spouse Name: N/A    Number of Children: N/A  . Years of Education: N/A   Social History Main Topics  . Smoking status:  Former Smoker    Quit date: 02/11/1963  . Smokeless tobacco: Never Used  . Alcohol Use: No  . Drug Use: No  . Sexually Active: None   Other Topics Concern  . None   Social History Narrative  . Lives in [place] with her husband.   Review of Systems  Exertional throat tightness, dyspnea, and left upper  back discomfort as outline above.  She has had some fatigue.  She uses a cane to get around.  She denies pnd, orthopnea, dizziness, syncope, edema, or early satiety.  All other systems reviewed and are otherwise negative except as noted above.  Physical Exam  Blood pressure 116/51, pulse 67, height 5' (1.524 m), weight 142 lb 6.4 oz (64.592 kg).  General: Pleasant, NAD Psych: Normal affect. Neuro: Alert and oriented X 3. Moves all extremities spontaneously. HEENT: Normal  Neck: Supple without bruits or JVD. Lungs:  Resp regular and unlabored, CTA. Heart: RRR no s3, s4, or murmurs. Abdomen: Soft, non-tender, non-distended, BS + x 4.  Extremities: No clubbing, cyanosis or edema. DP/PT/Radials 2+ and equal bilaterally.  Accessory Clinical Findings  ECG - rsr, 70, lbbb, no acute st/t changes.  Assessment & Plan  1.  [place]:  Pt presents following an episode approx 10 days ago, that was nitrate responsive and concerning for angina.  I have discussed her case with Dr. Patty Sermons and we will plan to proceed with lexiscan myoview to evaluate for ischemia.  She will continue on asa, bb, and arb therapy.  If Celine Ahr is high-risk, she will require diagnostic cath.  2.  HTN:  Stable.  Cont bb/arb/hctz.  3.  Dispo: Myoview this week and f/u within the next 2 wks.  Nicolasa Ducking, NP 05/24/2012, 12:56 PM

## 2012-05-24 NOTE — Patient Instructions (Addendum)
Your physician has requested that you have a lexiscan myoview. For further information please visit [email or web address] Please follow instruction sheet, as given.  PLEASE FOLLOW UP WITH DR. Patty Sermons IN THE NEXT 2 WEEKS   NO MEDICATION CHANGES WERE MADE TODAY

## 2012-05-26 ENCOUNTER — Telehealth (INDEPENDENT_AMBULATORY_CARE_PROVIDER_SITE_OTHER): Payer: Self-pay | Admitting: *Deleted

## 2012-05-26 NOTE — Telephone Encounter (Signed)
Karen [name] called concerning her virtual colonoscopy

## 2012-05-26 NOTE — Telephone Encounter (Signed)
Chaneka would like for Terri to return her call at [phone]. She has not heard anything back from her Virtual TCS that was done about 3 weeks ago in [place].

## 2012-05-28 NOTE — Telephone Encounter (Signed)
Results a virtual colonoscopy reviewed with patient. Suspect these abnormalities are do to stool and not necessarily polyps. Patient would like to see the films. She would stop by at the office next week Thursday so that she can review the films with me. No appointment needed.

## 2012-06-01 ENCOUNTER — Ambulatory Visit: Payer: Medicare Other | Admitting: Physician Assistant

## 2012-06-01 ENCOUNTER — Other Ambulatory Visit: Payer: Self-pay | Admitting: *Deleted

## 2012-06-01 MED ORDER — LOSARTAN POTASSIUM-HCTZ 100-12.5 MG PO TABS: 1.0000 | ORAL_TABLET | Freq: Every day | ORAL | Status: DC

## 2012-06-03 ENCOUNTER — Ambulatory Visit (HOSPITAL_COMMUNITY): Payer: Medicare Other | Attending: Cardiology | Admitting: Radiology

## 2012-06-03 VITALS — BP 175/78 | HR 68 | Ht 60.0 in | Wt 147.0 lb

## 2012-06-03 DIAGNOSIS — Z87891 Personal history of nicotine dependence: Secondary | ICD-10-CM | POA: Insufficient documentation

## 2012-06-03 DIAGNOSIS — R070 Pain in throat: Secondary | ICD-10-CM | POA: Insufficient documentation

## 2012-06-03 DIAGNOSIS — R5383 Other fatigue: Secondary | ICD-10-CM | POA: Insufficient documentation

## 2012-06-03 DIAGNOSIS — R002 Palpitations: Secondary | ICD-10-CM | POA: Diagnosis not present

## 2012-06-03 DIAGNOSIS — I10 Essential (primary) hypertension: Secondary | ICD-10-CM | POA: Insufficient documentation

## 2012-06-03 DIAGNOSIS — R079 Chest pain, unspecified: Secondary | ICD-10-CM

## 2012-06-03 DIAGNOSIS — I200 Unstable angina: Secondary | ICD-10-CM

## 2012-06-03 DIAGNOSIS — R0789 Other chest pain: Secondary | ICD-10-CM | POA: Insufficient documentation

## 2012-06-03 DIAGNOSIS — R0602 Shortness of breath: Secondary | ICD-10-CM | POA: Insufficient documentation

## 2012-06-03 DIAGNOSIS — I447 Left bundle-branch block, unspecified: Secondary | ICD-10-CM | POA: Diagnosis not present

## 2012-06-03 DIAGNOSIS — R000 Tachycardia, unspecified: Secondary | ICD-10-CM | POA: Insufficient documentation

## 2012-06-03 DIAGNOSIS — R0989 Other specified symptoms and signs involving the circulatory and respiratory systems: Secondary | ICD-10-CM | POA: Insufficient documentation

## 2012-06-03 DIAGNOSIS — Z8249 Family history of ischemic heart disease and other diseases of the circulatory system: Secondary | ICD-10-CM | POA: Diagnosis not present

## 2012-06-03 DIAGNOSIS — R5381 Other malaise: Secondary | ICD-10-CM | POA: Insufficient documentation

## 2012-06-03 DIAGNOSIS — R0609 Other forms of dyspnea: Secondary | ICD-10-CM | POA: Diagnosis not present

## 2012-06-03 DIAGNOSIS — E119 Type 2 diabetes mellitus without complications: Secondary | ICD-10-CM | POA: Diagnosis not present

## 2012-06-03 DIAGNOSIS — E785 Hyperlipidemia, unspecified: Secondary | ICD-10-CM | POA: Insufficient documentation

## 2012-06-03 MED ORDER — TECHNETIUM TC 99M SESTAMIBI GENERIC - CARDIOLITE
11.0000 | Freq: Once | INTRAVENOUS | Status: AC | PRN
  Administered 2012-06-03: 11 via INTRAVENOUS

## 2012-06-03 MED ORDER — ADENOSINE (DIAGNOSTIC) 3 MG/ML IV SOLN
0.5600 mg/kg | Freq: Once | INTRAVENOUS | Status: AC
  Administered 2012-06-03: 37.5 mg via INTRAVENOUS

## 2012-06-03 MED ORDER — TECHNETIUM TC 99M SESTAMIBI GENERIC - CARDIOLITE
33.0000 | Freq: Once | INTRAVENOUS | Status: AC | PRN
  Administered 2012-06-03: 33 via INTRAVENOUS

## 2012-06-03 NOTE — Progress Notes (Signed)
[facility] SITE 3 NUCLEAR MED [address] [place], [place] [address] [phone]    Cardiology Nuclear Med Study  Karen [name] is a 79 y.o. female     MRN : [id number]     DOB: 1929-10-13  Procedure Date: 06/03/2012  Nuclear Med Background Indication for Stress Test:  Evaluation for Ischemia History:  '02 Cath:normal coronaries, EF=35%; '09 OZH:YQMVHQ; '10 Echo:EF=45% Cardiac Risk Factors: Family History - CAD, History of Smoking, Hypertension, LBBB, Lipids and NIDDM  Symptoms:  Chest/Throat Pain/Tightness with Exertion (last episode of chest discomfort was about 2-days ago), DOE/SOB, Fatigue, Palpitations and Rapid HR    Nuclear Pre-Procedure Caffeine/Decaff Intake:  None NPO After: 9:30pm   Lungs:  Clear. O2 Sat: 99% on room air. IV 0.9% NS with Angio Cath:  22g  IV Site: R Antecubital  IV Started by:  Bonnita Levan, RN  Chest Size (in):  36 Cup Size: B  Height: 5' (1.524 m)  Weight:  147 lb (66.679 kg)  BMI:  Body mass index is 28.71 kg/(m^2). Tech Comments:  Patient held Coreg this AM    Nuclear Med Study 1 or 2 day study: 1 day  Stress Test Type:  Adenosine  Reading MD: Marca Ancona, MD  Order Authorizing Provider:  Cassell Clement, MD  Resting Radionuclide: Technetium 51m Sestamibi  Resting Radionuclide Dose: 11.0 mCi   Stress Radionuclide:  Technetium 41m Sestamibi  Stress Radionuclide Dose: 33.0 mCi           Stress Protocol Rest HR: 68 Stress HR: 93  Rest BP: 175/78 Stress BP: 149/65  Exercise Time (min): n/a METS: n/a   Predicted Max HR: 140 bpm % Max HR: 66.43 bpm Rate Pressure Product: 46962   Dose of Adenosine (mg):  37.4 Dose of Lexiscan: n/a mg  Dose of Atropine (mg): n/a Dose of Dobutamine: n/a mcg/kg/min (at max HR)  Stress Test Technologist: Smiley Houseman, CMA-N  Nuclear Technologist:  Domenic Polite, CNMT     Rest Procedure:  Myocardial perfusion imaging was performed at rest 45 minutes following the intravenous  administration of Technetium 19m Sestamibi.  Rest ECG: NSR-LBBB  Stress Procedure:  The patient received IV adenosine at 140 mcg/kg/min for 4 minutes.  She was very symptomatic with the infusion; c/o chest, head and stomach tightness.  Technetium 54m Sestamibi was injected at the 2 minute mark and quantitative spect images were obtained after a 45 minute delay.  Stress ECG: No significant change from baseline ECG  QPS Raw Data Images:  Normal; no motion artifact; normal heart/lung ratio. Stress Images:  Normal homogeneous uptake in all areas of the myocardium. Rest Images:  Normal homogeneous uptake in all areas of the myocardium. Subtraction (SDS):  There is no evidence of scar or ischemia. Transient Ischemic Dilatation (Normal <1.22):  1.00 Lung/Heart Ratio (Normal <0.45):  0.32  Quantitative Gated Spect Images QGS EDV:  76 ml QGS ESV:  23 ml  Impression Exercise Capacity:  Adenosine study with no exercise. BP Response:  Normal blood pressure response. Clinical Symptoms:  Chest tightness, headache.  ECG Impression:  Baseline:  LBBB.  EKG uninterpretable due to LBBB at rest and stress. Comparison with Prior Nuclear Study: No significant change from previous study  Overall Impression:  Normal stress nuclear study.  LV Ejection Fraction: 69%.  LV Wall Motion:  NL LV Function; NL Wall Motion  Marca Ancona 06/03/2012

## 2012-06-04 ENCOUNTER — Ambulatory Visit: Payer: Medicare Other | Admitting: Physician Assistant

## 2012-06-04 NOTE — Telephone Encounter (Signed)
Patient came in on 06/03/12 to see Dr. Karilyn Cota.

## 2012-06-07 ENCOUNTER — Encounter (INDEPENDENT_AMBULATORY_CARE_PROVIDER_SITE_OTHER): Payer: Self-pay | Admitting: Internal Medicine

## 2012-06-07 ENCOUNTER — Other Ambulatory Visit (INDEPENDENT_AMBULATORY_CARE_PROVIDER_SITE_OTHER): Payer: Self-pay | Admitting: Internal Medicine

## 2012-06-07 ENCOUNTER — Telehealth (INDEPENDENT_AMBULATORY_CARE_PROVIDER_SITE_OTHER): Payer: Self-pay | Admitting: *Deleted

## 2012-06-07 DIAGNOSIS — Z85038 Personal history of other malignant neoplasm of large intestine: Secondary | ICD-10-CM

## 2012-06-07 DIAGNOSIS — Z0189 Encounter for other specified special examinations: Secondary | ICD-10-CM | POA: Diagnosis not present

## 2012-06-07 DIAGNOSIS — Q438 Other specified congenital malformations of intestine: Secondary | ICD-10-CM

## 2012-06-07 DIAGNOSIS — R935 Abnormal findings on diagnostic imaging of other abdominal regions, including retroperitoneum: Secondary | ICD-10-CM

## 2012-06-07 NOTE — Telephone Encounter (Signed)
Lab noted.

## 2012-06-07 NOTE — Telephone Encounter (Signed)
Patient is having CT 06/14/12 and will need creatinine order sent to [facility]

## 2012-06-07 NOTE — Progress Notes (Signed)
This encounter was created in error - please disregard.

## 2012-06-10 DIAGNOSIS — Z0189 Encounter for other specified special examinations: Secondary | ICD-10-CM | POA: Diagnosis not present

## 2012-06-10 LAB — CREATININE, SERUM: Creat: 1.04 mg/dL (ref 0.50–1.10)

## 2012-06-11 ENCOUNTER — Encounter: Payer: Self-pay | Admitting: Cardiology

## 2012-06-11 ENCOUNTER — Ambulatory Visit (INDEPENDENT_AMBULATORY_CARE_PROVIDER_SITE_OTHER): Payer: Medicare Other | Admitting: Cardiology

## 2012-06-11 VITALS — BP 140/74 | HR 64 | Ht 60.0 in | Wt 144.4 lb

## 2012-06-11 DIAGNOSIS — Z932 Ileostomy status: Secondary | ICD-10-CM

## 2012-06-11 DIAGNOSIS — I200 Unstable angina: Secondary | ICD-10-CM | POA: Diagnosis not present

## 2012-06-11 DIAGNOSIS — I471 Supraventricular tachycardia, unspecified: Secondary | ICD-10-CM

## 2012-06-11 DIAGNOSIS — I447 Left bundle-branch block, unspecified: Secondary | ICD-10-CM

## 2012-06-11 NOTE — Assessment & Plan Note (Signed)
The patient is scheduled for another CT scan of her abdomen next week.  She has a functioning ileostomy.  She has had a history of bladder cancer once and a history of colon cancer twice

## 2012-06-11 NOTE — Patient Instructions (Addendum)
Your physician recommends that you continue on your current medications as directed. Please refer to the Current Medication list given to you today.  Your physician wants you to follow-up in: 6 month ov/ekg You will receive a reminder letter in the mail two months in advance. If you don't receive a letter, please call our office to schedule the follow-up appointment.  

## 2012-06-11 NOTE — Progress Notes (Signed)
Karen [name] Date of Birth:  05/25/1929 [facility] [address] [place], [place]  [address] [phone]         Fax   [phone]  History of Present Illness: This pleasant 79 year old woman is seen for a scheduled followup office visit. She has a complex past medical history. She has a known left bundle branch block. She does not have coronary disease. She had a normal coronary arteriogram in 2002 and a normal adenosine Cardiolite stress test in 2009. She had an echocardiogram in April 2011 showing normal systolic function with diastolic dysfunction.she was seen in the office last month with chest and jaw pain and underwent a Lexus scan Myoview stress test on 06/03/12 which was normal and showed an ejection fraction of 69% and no ischemia. He's had a past history of swallowing problems secondary to achalasia and has had successful endoscopic injections of Botox into her esophagus. She has a remote history of a severe intra-abdominal infection resulting in the need for an ileostomy. She has a history of hypothyroidism.   Current Outpatient Prescriptions  Medication Sig Dispense Refill  . ALPRAZolam (XANAX) 0.5 MG tablet Take 0.5 mg by mouth at bedtime.       Marland Kitchen amitriptyline (ELAVIL) 10 MG tablet Take 10 mg by mouth at bedtime.      Marland Kitchen aspirin EC 325 MG tablet Take 325 mg by mouth daily.      . carvedilol (COREG) 25 MG tablet Take 0.5 tablets (12.5 mg total) by mouth 2 (two) times daily with a meal.  30 tablet  6  . digoxin (LANOXIN) 0.125 MG tablet Take 1 tablet (0.125 mg total) by mouth daily.  30 tablet  5  . fish oil-omega-3 fatty acids 1000 MG capsule Take 2 g by mouth daily.      Marland Kitchen HYDROcodone-acetaminophen (VICODIN) 5-500 MG per tablet Take 1 tablet by mouth every 6 (six) hours as needed. For pain      . levothyroxine (SYNTHROID, LEVOTHROID) 75 MCG tablet Take 75 mcg by mouth daily.       Marland Kitchen losartan-hydrochlorothiazide (HYZAAR) 100-12.5 MG per tablet Take 1  tablet by mouth daily.  90 tablet  3  . Multiple Vitamins-Minerals (EQL GUMMY ADULT) CHEW Chew 2 tablets by mouth daily.       . pantoprazole (PROTONIX) 40 MG tablet Take 40 mg by mouth daily.      . nitroGLYCERIN (NITROSTAT) 0.4 MG SL tablet Place 0.4 mg under the tongue every 5 (five) minutes as needed. For chest pain       No current facility-administered medications for this visit.    Allergies  Allergen Reactions  . Bactrim (Sulfamethoxazole W-Trimethoprim) Shortness Of Breath and Other (See Comments)    Hurting all over  . Lidocaine Hcl Other (See Comments)    tachycardia  . Lisinopril Cough  . Nitrofurantoin Monohyd Macro Other (See Comments)    Unknown   . Ramipril (Ramipril) Cough  . Codeine Palpitations and Other (See Comments)    Heart races  . Demerol Palpitations and Other (See Comments)    tachycardia  . Morphine And Related Palpitations    Tachycardia     Patient Active Problem List   Diagnosis Date Noted  . Unstable angina 05/24/2012  . Colon cancer 04/29/2012  . Ileostomy status 06/04/2011  . Paroxysmal SVT (supraventricular tachycardia) 05/06/2011  . Dysphagia 12/30/2010  . Small bowel obstruction, partial 09/03/2010  . Left bundle branch block 07/24/2010  . Chest pain, exertional  07/24/2010  . Benign hypertensive heart disease without heart failure 07/24/2010  . Hypothyroid 07/24/2010    History  Smoking status  . Former Smoker  . Quit date: 02/11/1963  Smokeless tobacco  . Never Used    History  Alcohol Use No    Family History  Problem Relation Age of Onset  . Stroke    . Hypertension    . Heart disease Mother   . Stroke Mother     deceased  . Cancer Mother     bladder  . Arthritis Mother   . Arthritis Father   . Heart disease Father     decesaed  . Cancer Father     leukemia  . Stroke Sister     alive/debilitated  . Hypertension Sister   . Other Sister     paralysis  . Heart disease Brother     bypass surgery  . Cancer  Brother   . Arthritis Brother   . Stroke Sister     alive/debilitated  . Diabetes Sister   . Other Brother     stomach problems  . Other Brother     bladder    Review of Systems: Constitutional: no fever chills diaphoresis or fatigue or change in weight.  Head and neck: no hearing loss, no epistaxis, no photophobia or visual disturbance. Respiratory: No cough, shortness of breath or wheezing. Cardiovascular: No chest pain peripheral edema, palpitations. Gastrointestinal: No abdominal distention, no abdominal pain, no change in bowel habits hematochezia or melena. Genitourinary: No dysuria, no frequency, no urgency, no nocturia. Musculoskeletal:No arthralgias, no back pain, no gait disturbance or myalgias. Neurological: No dizziness, no headaches, no numbness, no seizures, no syncope, no weakness, no tremors. Hematologic: No lymphadenopathy, no easy bruising. Psychiatric: No confusion, no hallucinations, no sleep disturbance.    Physical Exam: Filed Vitals:   06/11/12 1147  BP: 140/74  Pulse: 64  the general appearance reveals a well-developed well-nourished woman in no distress.The head and neck exam reveals pupils equal and reactive.  Extraocular movements are full.  There is no scleral icterus.  The mouth and pharynx are normal.  The neck is supple.  The carotids reveal no bruits.  The jugular venous pressure is normal.  The  thyroid is not enlarged.  There is no lymphadenopathy.  The chest is clear to percussion and auscultation.  There are no rales or rhonchi.  Expansion of the chest is symmetrical.  The precordium is quiet.  The first heart sound is normal.  The second heart sound is physiologically split.  There is no murmur gallop rub or click.  There is no abnormal lift or heave.  The abdomen is soft and nontender.ileostomy present  The bowel sounds are normal.  The liver and spleen are not enlarged.  There are no abdominal masses.  There are no abdominal bruits.  Extremities  reveal good pedal pulses.  There is no phlebitis or edema.  There is no cyanosis or clubbing.  Strength is normal and symmetrical in all extremities.  There is no lateralizing weakness.  There are no sensory deficits.  The skin is warm and dry.  There is no rash.     Assessment / Plan:  Continue same medication.  Recheck in 6 months for followup office visit and EKG

## 2012-06-11 NOTE — Assessment & Plan Note (Signed)
The patient has not had any prolonged episodes of SVT.  She will sometimes feel palpitations which last just a few seconds.

## 2012-06-11 NOTE — Assessment & Plan Note (Signed)
Since her last office visit she had a normal nuclear stress test which was reassuring and showed normal LV function.  She has had less chest and jaw discomfort and she is less concerned about it

## 2012-06-14 ENCOUNTER — Telehealth: Payer: Self-pay | Admitting: *Deleted

## 2012-06-14 ENCOUNTER — Ambulatory Visit (HOSPITAL_COMMUNITY)
Admission: RE | Admit: 2012-06-14 | Discharge: 2012-06-14 | Disposition: A | Discharge status: Home / Self Care | Payer: Medicare Other | Source: Ambulatory Visit | Attending: Internal Medicine | Admitting: Internal Medicine

## 2012-06-14 DIAGNOSIS — Q438 Other specified congenital malformations of intestine: Secondary | ICD-10-CM

## 2012-06-14 DIAGNOSIS — R935 Abnormal findings on diagnostic imaging of other abdominal regions, including retroperitoneum: Secondary | ICD-10-CM

## 2012-06-14 DIAGNOSIS — IMO0002 Reserved for concepts with insufficient information to code with codable children: Secondary | ICD-10-CM | POA: Insufficient documentation

## 2012-06-14 DIAGNOSIS — Z85038 Personal history of other malignant neoplasm of large intestine: Secondary | ICD-10-CM

## 2012-06-14 DIAGNOSIS — N281 Cyst of kidney, acquired: Secondary | ICD-10-CM | POA: Diagnosis not present

## 2012-06-14 MED ORDER — IOHEXOL 300 MG/ML  SOLN
100.0000 mL | Freq: Once | INTRAMUSCULAR | Status: AC | PRN
  Administered 2012-06-14: 100 mL via INTRAVENOUS

## 2012-06-14 NOTE — Telephone Encounter (Signed)
Message copied by Burnell Blanks on Mon Jun 14, 2012  9:45 AM ------      Message from: Cassell Clement      Created: Wed Jun 09, 2012 11:20 AM       Please report.  The nuclear stress test was normal.  The ejection fraction was 69%. ------

## 2012-06-14 NOTE — Telephone Encounter (Signed)
Advised patient

## 2012-07-08 NOTE — Progress Notes (Signed)
Apt has been scheduled for 07/20/12 at 11:15 am with Dr. Karilyn Cota. Patient advised and voices understood.

## 2012-07-20 ENCOUNTER — Ambulatory Visit (INDEPENDENT_AMBULATORY_CARE_PROVIDER_SITE_OTHER): Payer: Medicare Other | Admitting: Internal Medicine

## 2012-07-20 ENCOUNTER — Encounter (INDEPENDENT_AMBULATORY_CARE_PROVIDER_SITE_OTHER): Payer: Self-pay | Admitting: Internal Medicine

## 2012-07-20 ENCOUNTER — Ambulatory Visit: Payer: Medicare Other | Admitting: Cardiology

## 2012-07-20 VITALS — BP 120/72 | HR 70 | Temp 99.1°F | Resp 18 | Ht 60.0 in | Wt 141.9 lb

## 2012-07-20 DIAGNOSIS — R935 Abnormal findings on diagnostic imaging of other abdominal regions, including retroperitoneum: Secondary | ICD-10-CM | POA: Diagnosis not present

## 2012-07-20 NOTE — Patient Instructions (Signed)
Will arrange consultation with Dr. Glenford Peers.

## 2012-07-20 NOTE — Progress Notes (Signed)
Presenting complaint;  Patient is here to discuss results of abdominopelvic CT revealing persistent defects in sigmoid colon.  Subjective:  Patient is an 79 year old Caucasian female who is here for scheduled visit accompanied by her husband and her daughter. She has history of colon carcinoma. Initial surgery was over 30 years ago. She had second primary about 4 years ago leading to another surgery which was complicated by anastomotic leak and intra-abdominal abscesses resulting in prolonged hospitalization and multiple intervention and she finally ended doublets ileostomy. She had flexible sigmoidoscopy in August 2013 all of the excluded segment could not be examined. She finally had a virtual colonoscopy in March 2014 which revealed at least 3 polypoid lesions. She had abdominal pelvic CT with rectal contrast on 07/06/2012 revealing small caliber to sigmoid colon and 3 filling defects. She feels fine. She has occasional pain in right mid abdomen ratio is developed parastomal hernia. She denies rectal discharge or bleeding. Ileostomy output has not changed. She says she is able to swallow fairly well. There has been no change in the last several months. He has history of a chalasia treated with Botox injection.   Current Medications: Current Outpatient Prescriptions  Medication Sig Dispense Refill  . ALPRAZolam (XANAX) 0.5 MG tablet Take 0.5 mg by mouth at bedtime.       Marland Kitchen aspirin EC 325 MG tablet Take 325 mg by mouth daily.      . carvedilol (COREG) 25 MG tablet Take 0.5 tablets (12.5 mg total) by mouth 2 (two) times daily with a meal.  30 tablet  6  . digoxin (LANOXIN) 0.125 MG tablet Take 1 tablet (0.125 mg total) by mouth daily.  30 tablet  5  . fish oil-omega-3 fatty acids 1000 MG capsule Take 2 g by mouth daily.      Marland Kitchen HYDROcodone-acetaminophen (VICODIN) 5-500 MG per tablet Take 1 tablet by mouth every 6 (six) hours as needed. For pain      . levothyroxine (SYNTHROID, LEVOTHROID) 75 MCG  tablet Take 75 mcg by mouth daily.       Marland Kitchen losartan-hydrochlorothiazide (HYZAAR) 100-12.5 MG per tablet Take 1 tablet by mouth daily.  90 tablet  3  . Multiple Vitamins-Minerals (EQL GUMMY ADULT) CHEW Chew 2 tablets by mouth daily.       . pantoprazole (PROTONIX) 40 MG tablet Take 40 mg by mouth daily.      Marland Kitchen amitriptyline (ELAVIL) 10 MG tablet Take 10 mg by mouth at bedtime.      . nitroGLYCERIN (NITROSTAT) 0.4 MG SL tablet Place 0.4 mg under the tongue every 5 (five) minutes as needed. For chest pain       No current facility-administered medications for this visit.     Objective: Blood pressure 120/72, pulse 70, temperature 99.1 F (37.3 C), temperature source Oral, resp. rate 18, height 5' (1.524 m), weight 141 lb 14.4 oz (64.365 kg). Patient is alert and in no acute distress. Conjunctiva is pink. Sclera is nonicteric Oropharyngeal mucosa is normal. No neck masses or thyromegaly noted. Cardiac exam with regular rhythm normal S1 and S2. No murmur or gallop noted. Lungs are clear to auscultation. Abdomen is asymmetric with the bulge in the right flank lateral to ileostomy. Ileostomy bag has greenish liquid stool. Abdomen is soft and nontender. No organomegaly or masses the  No LE edema or clubbing noted.  Labs/studies Results: Virtual colonoscopy films from 05/13/2012 and CT results from 07/06/2012 reviewed with patient and her family members. They're to soft tissue densities  involving proximal sigmoid colon. One of these appeared to be in the same location on both of these studies. Sigmoid colon is very tortuous and of small caliber.   Assessment; Persistent filling defects in sigmoid colon. This segment could not be examined via colonoscopy even with pediatric colonoscope. I doubt that her colon could be examined with ultra slim scope. These abnormalities could be polyps or residual stool. Perhaps only other way to further evaluate these abnormalities is the PET scan or she could have  a followup CT in 6 months. .   Plan:  Consultation with Dr. Mariel Sleet. Office visit in 6 months.

## 2012-07-22 DIAGNOSIS — H31019 Macula scars of posterior pole (postinflammatory) (post-traumatic), unspecified eye: Secondary | ICD-10-CM | POA: Diagnosis not present

## 2012-07-22 DIAGNOSIS — H35349 Macular cyst, hole, or pseudohole, unspecified eye: Secondary | ICD-10-CM | POA: Diagnosis not present

## 2012-07-22 DIAGNOSIS — D3130 Benign neoplasm of unspecified choroid: Secondary | ICD-10-CM | POA: Diagnosis not present

## 2012-07-22 DIAGNOSIS — H35079 Retinal telangiectasis, unspecified eye: Secondary | ICD-10-CM | POA: Diagnosis not present

## 2012-07-28 ENCOUNTER — Ambulatory Visit: Payer: Medicare Other | Admitting: Cardiology

## 2012-07-30 DIAGNOSIS — I10 Essential (primary) hypertension: Secondary | ICD-10-CM | POA: Diagnosis not present

## 2012-07-30 DIAGNOSIS — M654 Radial styloid tenosynovitis [de Quervain]: Secondary | ICD-10-CM | POA: Diagnosis not present

## 2012-07-30 DIAGNOSIS — M25539 Pain in unspecified wrist: Secondary | ICD-10-CM | POA: Diagnosis not present

## 2012-08-17 DIAGNOSIS — M654 Radial styloid tenosynovitis [de Quervain]: Secondary | ICD-10-CM | POA: Diagnosis not present

## 2012-08-17 DIAGNOSIS — M25539 Pain in unspecified wrist: Secondary | ICD-10-CM | POA: Diagnosis not present

## 2012-08-17 DIAGNOSIS — I10 Essential (primary) hypertension: Secondary | ICD-10-CM | POA: Diagnosis not present

## 2012-08-24 ENCOUNTER — Other Ambulatory Visit: Payer: Self-pay | Admitting: *Deleted

## 2012-08-24 ENCOUNTER — Ambulatory Visit (INDEPENDENT_AMBULATORY_CARE_PROVIDER_SITE_OTHER): Payer: Medicare Other | Admitting: Urology

## 2012-08-24 DIAGNOSIS — N393 Stress incontinence (female) (male): Secondary | ICD-10-CM | POA: Diagnosis not present

## 2012-08-24 DIAGNOSIS — C679 Malignant neoplasm of bladder, unspecified: Secondary | ICD-10-CM

## 2012-08-24 MED ORDER — CARVEDILOL 25 MG PO TABS: 12.5000 mg | ORAL_TABLET | Freq: Two times a day (BID) | ORAL | Status: DC

## 2012-08-26 ENCOUNTER — Other Ambulatory Visit (INDEPENDENT_AMBULATORY_CARE_PROVIDER_SITE_OTHER): Payer: Self-pay | Admitting: Internal Medicine

## 2012-08-26 DIAGNOSIS — R935 Abnormal findings on diagnostic imaging of other abdominal regions, including retroperitoneum: Secondary | ICD-10-CM

## 2012-09-02 ENCOUNTER — Encounter (HOSPITAL_COMMUNITY): Payer: Medicare Other | Attending: Hematology and Oncology

## 2012-09-02 ENCOUNTER — Encounter (HOSPITAL_COMMUNITY): Payer: Self-pay

## 2012-09-02 VITALS — BP 149/76 | HR 72 | Temp 98.0°F | Resp 16 | Ht 61.75 in | Wt 146.6 lb

## 2012-09-02 DIAGNOSIS — C189 Malignant neoplasm of colon, unspecified: Secondary | ICD-10-CM | POA: Diagnosis not present

## 2012-09-02 DIAGNOSIS — Z85038 Personal history of other malignant neoplasm of large intestine: Secondary | ICD-10-CM | POA: Diagnosis not present

## 2012-09-02 DIAGNOSIS — Z09 Encounter for follow-up examination after completed treatment for conditions other than malignant neoplasm: Secondary | ICD-10-CM | POA: Diagnosis not present

## 2012-09-02 DIAGNOSIS — C679 Malignant neoplasm of bladder, unspecified: Secondary | ICD-10-CM | POA: Diagnosis not present

## 2012-09-02 DIAGNOSIS — Z8551 Personal history of malignant neoplasm of bladder: Secondary | ICD-10-CM | POA: Insufficient documentation

## 2012-09-02 LAB — CBC WITH DIFFERENTIAL/PLATELET
Basophils Absolute: 0 10*3/uL (ref 0.0–0.1)
Basophils Relative: 0 % (ref 0–1)
Eosinophils Absolute: 0.1 10*3/uL (ref 0.0–0.7)
MCH: 29.7 pg (ref 26.0–34.0)
MCHC: 33 g/dL (ref 30.0–36.0)
Neutro Abs: 3.4 10*3/uL (ref 1.7–7.7)
Neutrophils Relative %: 65 % (ref 43–77)
RDW: 13 % (ref 11.5–15.5)

## 2012-09-02 LAB — COMPREHENSIVE METABOLIC PANEL
AST: 32 U/L (ref 0–37)
Albumin: 3.9 g/dL (ref 3.5–5.2)
Alkaline Phosphatase: 75 U/L (ref 39–117)
Chloride: 100 mEq/L (ref 96–112)
Creatinine, Ser: 1.03 mg/dL (ref 0.50–1.10)
Potassium: 3.9 mEq/L (ref 3.5–5.1)
Total Bilirubin: 0.3 mg/dL (ref 0.3–1.2)
Total Protein: 7.6 g/dL (ref 6.0–8.3)

## 2012-09-02 NOTE — Progress Notes (Addendum)
Patient History and Physical   Karen [name] [id number] [birth year] 79 y.o. 09/02/2012  Referring MD: Lionel December, MD.   Chief Complaint: Abnormal Virtual colonoscopy/ Abdominal CT scan.  HPI:  Ms Karen [name] is an 79 year old woman with history of multiple cancers as she tells me.  I have reviewed her records in  [facility] EHR. She tells me she was referred by Dr. Karilyn Cota and  his most recent note on 07/22/2012 has been reviewed .   She was accompanied by her daughter Karen [name].  I do not have an oncologic records at this time, however patient tells me that she was initially diagnosed with the first colon cancer in 1989 and was treated with surgery at [facility] .  She states that subsequently she was diagnosed with bladder cancer 08/2008 also treated at [facility].  Then in July of 2010 she was diagnosed with a second colon cancer and was hospitalized at [facility]  But later sent to [facility] where she underwent surgery x3 and now has an ileostomy. She reports that she was never treated with adjuvant chemotherapy in of the cancers.  According to Dr. Karilyn Cota, patient had flexible sigmoidoscopy in August of 2013 all the segments could not be examined, she been was sent for Virtual colonoscopy in April of 2014;  "persistent polypoid lesion within the mid sigmoid colon at approximately 31 cm from anal verge measuring 19 x 14 mm. This has attenuation characteristics of a polyp. Also, within the proximal sigmoid - distal descending colon there is a 7 x 6 mm polypoid lesion at approximately 44 cm from the anal verge suspicious for a small polyp as well. Since the entire colon did not insufflate well, these distance measurements are possibly  inaccurate. A questionable lesion is present at 48 cm from anal verge measuring 10 x 13 mm. There is a polypoid defect in the region of the splenic flexure which appears to change shape between the supine and prone images. This could represent  retained feces  although an additional polypoid lesion cannot be excluded"   There is a question of repeat CT scan versus PET /CT scan.  She states that she feels well except for generalized chronic aches and pains related to arthritis and fibromyalgia.  She denies shortness of breath or fatigue.  Appetite is good and denies unintended weight loss. She raised the concern that there may be a hernia around ileostomy stump I was not able to see during exam as the view was obscured by the colostomy bag.  She states that she saw her urologist last week and was today that urine cytology was negative.  PMH: Past Medical History  Diagnosis Date  . Chronic diastolic CHF (congestive heart failure)     a. nl EF by echo 05/2009.  Marland Kitchen DVT of deep femoral vein   . Palpitations   . History of PSVT (paroxysmal supraventricular tachycardia)   . LBBB (left bundle branch block)   . Hypertension   . Abdominal adhesions   . Ileostomy present   . Colon polyp   . Chest pain     a. 2002 Cath: nl cors;  b. 2009 aden mv: nl;  c. 05/2009 Echo: nl.  . Wears dentures   . Breast lump   . Hyperlipidemia   . Thyroid disease   . Cancer     bladder  . Anemia   . History of blood clots   . Hernia   . Blood transfusion   . Hearing loss   .  Arthritis   . Colon cancer     x 2. Last 2010  . GERD (gastroesophageal reflux disease)     Past Surgical History  Procedure Laterality Date  . Cardiac catheterization  12/10/2000    THE LEFT VENTRICLE IS MILDY DILATED. THERE IS MILD TO MODERATE DIFFUSE HYPOKINESIS WITH EF 35%  . Colon cancer surgery    . Ileostomy    . Esophagogastroduodenoscopy  02/13/2011    Procedure: ESOPHAGOGASTRODUODENOSCOPY (EGD);  Surgeon: Malissa Hippo, MD;  Location: [facility];  Service: Endoscopy;  Laterality: N/A;  1030  . Colonoscopy  09/26/2011    Procedure: COLONOSCOPY;  Surgeon: Malissa Hippo, MD;  Location: [facility];  Service: Endoscopy;  Laterality: N/A;  215  . Cataract  extraction w/phaco  11/13/2011    Procedure: CATARACT EXTRACTION PHACO AND INTRAOCULAR LENS PLACEMENT (IOC);  Surgeon: Gemma Payor, MD;  Location: [facility] ORS;  Service: Ophthalmology;  Laterality: Right;  CDE 12.26  . Cataract extraction w/phaco  12/08/2011    Procedure: CATARACT EXTRACTION PHACO AND INTRAOCULAR LENS PLACEMENT (IOC);  Surgeon: Gemma Payor, MD;  Location: [facility] OR[facility];  Service: Ophthalmology;  Laterality: Left;  CDE 15.11    Allergies: Allergies  Allergen Reactions  . Bactrim (Sulfamethoxazole W-Trimethoprim) Shortness Of Breath and Other (See Comments)    Hurting all over  . Lidocaine Hcl Other (See Comments)    tachycardia  . Lisinopril Cough  . Nitrofurantoin Monohyd Macro Other (See Comments)    Unknown   . Ramipril (Ramipril) Cough  . Codeine Palpitations and Other (See Comments)    Heart races  . Demerol Palpitations and Other (See Comments)    tachycardia  . Morphine And Related Palpitations    Tachycardia     Medications: Current outpatient prescriptions:ALPRAZolam (XANAX) 0.5 MG tablet, Take 0.5 mg by mouth at bedtime. , Disp: , Rfl: ;  amitriptyline (ELAVIL) 10 MG tablet, Take 10 mg by mouth at bedtime., Disp: , Rfl: ;  aspirin EC 325 MG tablet, Take 325 mg by mouth daily., Disp: , Rfl: ;  carvedilol (COREG) 25 MG tablet, Take 0.5 tablets (12.5 mg total) by mouth 2 (two) times daily with a meal., Disp: 30 tablet, Rfl: 6 digoxin (LANOXIN) 0.125 MG tablet, Take 1 tablet (0.125 mg total) by mouth daily., Disp: 30 tablet, Rfl: 5;  fish oil-omega-3 fatty acids 1000 MG capsule, Take 2 g by mouth daily., Disp: , Rfl: ;  HYDROcodone-acetaminophen (VICODIN) 5-500 MG per tablet, Take 1 tablet by mouth every 6 (six) hours as needed. For pain, Disp: , Rfl: ;  levothyroxine (SYNTHROID, LEVOTHROID) 75 MCG tablet, Take 75 mcg by mouth daily. , Disp: , Rfl:  losartan-hydrochlorothiazide (HYZAAR) 100-12.5 MG per tablet, Take 1 tablet by mouth daily., Disp: 90 tablet, Rfl: 3;  Multiple  Vitamins-Minerals (EQL GUMMY ADULT) CHEW, Chew 2 tablets by mouth daily. , Disp: , Rfl: ;  pantoprazole (PROTONIX) 40 MG tablet, Take 40 mg by mouth daily., Disp: , Rfl: ;  nitroGLYCERIN (NITROSTAT) 0.4 MG SL tablet, Place 0.4 mg under the tongue every 5 (five) minutes as needed. For chest pain, Disp: , Rfl:    Social History:   reports that she quit smoking about 49 years ago. She has never used smokeless tobacco. She reports that she does not drink alcohol or use illicit drugs. Quit  Smoking in 1965. 1pack per week for  12 yrs. Denies alcohol use. Lives with Husband husband. Independent with ADLS and IADLS.  Family History: Family History  Problem  Relation Age of Onset  . Stroke    . Hypertension    . Heart disease Mother   . Stroke Mother     deceased  . Cancer Mother     bladder  . Arthritis Mother   . Arthritis Father   . Heart disease Father     decesaed  . Cancer Father     leukemia  . Stroke Sister     alive/debilitated  . Hypertension Sister   . Other Sister     paralysis  . Heart disease Brother     bypass surgery  . Cancer Brother   . Arthritis Brother   . Stroke Sister     alive/debilitated  . Diabetes Sister   . Other Brother     stomach problems  . Other Brother     bladder  Mother had ?'colon cancer' died in her 98s of stoke Had cancer of the bladder when she was a baby. Dad died of leukemia at 79. Brother dies at 43 had colon and bladder cancer. Came with Therasa walker. Has 4 children ; 2 girls and 2 boys.  Review of Systems: 14 point review of system is as in the history above otherwise negative.or   Physical Exam: Blood pressure 149/76, pulse 72, temperature 98 F (36.7 C), temperature source Oral, resp. rate 16, height 5' 1.75" (1.568 m), weight 146 lb 9.6 oz (66.497 kg). GENERAL: No distress, elderly Caucasian female. SKIN:  No rashes or significant lesions  HEAD: Normocephalic, No masses, lesions, tenderness or abnormalities  EYES:  Conjunctiva are pink and non-injected  ENT: External ears normal ,lips, buccal mucosa, and tongue normal and mucous membranes are moist  LYMPH: No palpable lymphadenopathy, in the neck or supraclavicular areas. LUNGS: clear to auscultation , no crackles or wheezes HEART: regular rate & rhythm, no murmurs, no gallops, S1 normal and S2 normal  ABDOMEN: Abdomen soft,well surgical scars noted.  Colostomy bag in place with liquid fecal material. organomegaly and no hepatosplenomegaly palpable.  Abdomen was otherwise non tender with positive bowel sounds.  EXTREMITIES: No edema, no skin discoloration or tenderness NEURO: alert & oriented no focal neurologic deficits.     Lab Results: Lab Results  Component Value Date   WBC 6.6 08/09/2010   HGB 11.6* 11/11/2011   HCT 34.9* 11/11/2011   MCV 86.7 08/09/2010   PLT 202 08/09/2010     Chemistry      Component Value Date/Time   NA 142 11/11/2011 1141   K 3.3* 11/11/2011 1141   CL 105 11/11/2011 1141   CO2 28 11/11/2011 1141   BUN 22 11/11/2011 1141   CREATININE 1.04 06/07/2012 1651   CREATININE 1.01 11/11/2011 1141      Component Value Date/Time   CALCIUM 9.5 11/11/2011 1141   ALKPHOS 94 08/08/2010 0655   AST 22 08/08/2010 0655   ALT 19 08/08/2010 0655   BILITOT 0.3 08/08/2010 0655         Radiological Studies: No results found.    Impression: Ms Beilfuss  has history of multiple colon cancers from her history.  Her oncology records are not available at this time to confirm this. There is concern about abnormal findings on abdominal  CT/ virtual  Colonoscopy , report that this area is not accessible by an endoscope  due to narrowing is also noted. It is difficult to say what these are based on her the reports.  This could represent polyps or fecal material . I  think that repeat  CT of the Abdomen and pelvis in 6 months is reasonable. I do not feel that a PET/CT will be very helpful given that physiologic FDG  uptake in the colon is not uncommon  and would likely not make the etiology clearer.   Recommendations: 1.  Repeat CT of the abdomen with contrast in 6 months from the last one in May of 2014. 2.  I will order a CBC, CMP and CEA today. 3.  I will try to obtain patient's  previous oncology records. 4. Surveillance for colon cancer per NCCN guidelines 5. Return to clinic in 4 months after her CT scan. 6.  Discuss ileostomy  issues with her surgeon    All questions were satisfactorily answered.She knows to call if she has any concern.  I spent more than 50% of the time counseling the patient face to face. The total time spent in the appointment was 60 minutes.  Thank you Dr Karilyn Cota for this referral.   Sherral Hammers, MD FACP. Hematology/Oncology.    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  This was interpreted as possible somatic/epigenetic  inactivation and a was very unlikely to be germline mutation. BRAF [id number] was recommended but I don't have any evidence at this time that this was done.  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 The tumor penetrated through the bowel wall however no tumor was identified in the fat outside the bowel which appears T3 lesion measuring 4 cm in diameter and was ulcerated.  Previously; colonoscopic biopsy of the colon on 04/10/1987 had shown moderately differentiated infiltrating adenocarcinoma.

## 2012-09-02 NOTE — Patient Instructions (Signed)
[facility] Discharge Instructions  RECOMMENDATIONS MADE BY THE CONSULTANT AND ANY TEST RESULTS WILL BE SENT TO YOUR REFERRING PHYSICIAN.  EXAM FINDINGS BY THE PHYSICIAN TODAY AND SIGNS OR SYMPTOMS TO REPORT TO CLINIC OR PRIMARY PHYSICIAN: Exam findings as discussed by Dr. Sharia Reeve.  SPECIAL INSTRUCTIONS/FOLLOW-UP: 1.  Dr. Sharia Reeve suggests that we monitor your abdominal CTs for any changes that may be suggestive for cancer at this point - you are scheduled to have a CT 6 months from your last.  Information on taking your contrast is provided below. 2.  Please keep your appointment to be seen here in the office after your CT. 3.  You had baseline labs done today for which we'll contact you with any abnormals. 4.  Please follow-up with Dr. Abbey Chatters regarding your concerns about your hernia size.  Thank you for choosing [facility] to provide your oncology and hematology care.  To afford each patient quality time with our providers, please arrive at least 15 minutes before your scheduled appointment time.  With your help, our goal is to use those 15 minutes to complete the necessary work-up to ensure our physicians have the information they need to help with your evaluation and healthcare recommendations.    Effective January 1st, 2014, we ask that you re-schedule your appointment with our physicians should you arrive 10 or more minutes late for your appointment.  We strive to give you quality time with our providers, and arriving late affects you and other patients whose appointments are after yours.    Again, thank you for choosing [facility].  Our hope is that these requests will decrease the amount of time that you wait before being seen by our physicians.       _____________________________________________________________  Should you have questions after your visit to [facility], please contact our office at [phone] between  the hours of 8:30 a.m. and 5:00 p.m.  Voicemails left after 4:30 p.m. will not be returned until the following business day.  For prescription refill requests, have your pharmacy contact our office with your prescription refill request.    [facility] Radiology Department  Instructions for CT Abdomen/Pelvis  Karen [name], your exam is scheduled for Tuesday at 0930  in the morning.  **WARNING** IF YOU ARE ALLERGIC TO IODINE/X-RAY DYE, PLEASE NOTIFY us IMMEDIATELY.  YOU MAY NEED ADDITIONAL PRE-MEDICATIONS THE DAY PRIOR TO YOUR EXAM.   Please follow these instructions on the day of your exam.   Do not eat or drink anything after 0730.  (2 hours prior to the exam time)   You will be given two bottles of ReadiCat oral contrast solution to drink.  The solution may taste better if refrigerated, but do not add ice.  SHAKE [name] BEFORE DRINKING   Drink one bottle of ReadiCat (barium solution) at 0730.  (2 hours prior to your exam)   Drink one bottle of ReadiCat (barium solution) at 0830.  (1 hour prior to your exam)   You may take any medications as prescribed, with a small amount of water, if necessary.   Please arrive 30 minutes prior to appointment time to register.   Come in and report to Short Stay to register.   If registering in any area other than Radiology, notify the Radiology front office upon your arrival in the Radiology Department and they will notify the CT Department.  The purpose of you drinking the oral contrast solution (  ReadiCat) is to aid in the visualization of your intestinal tract.  The contrast solution may cause some diarrhea. Before your exam is started, you will be given a small amount of water to drink.  Depending on your individual set of symptoms, you may also receive an intravenous injection of x-ray contrast/iodine.  Plan on being in Radiology for 30-60 minutes or longer, depending on the type of exam you are having performed.  If you have any  questions regarding your procedure, you may call the CT Department at [phone].

## 2012-09-03 LAB — CEA: CEA: 1.3 ng/mL (ref 0.0–5.0)

## 2012-09-23 DIAGNOSIS — F411 Generalized anxiety disorder: Secondary | ICD-10-CM | POA: Diagnosis not present

## 2012-09-23 DIAGNOSIS — E119 Type 2 diabetes mellitus without complications: Secondary | ICD-10-CM | POA: Diagnosis not present

## 2012-09-23 DIAGNOSIS — E7800 Pure hypercholesterolemia, unspecified: Secondary | ICD-10-CM | POA: Diagnosis not present

## 2012-09-23 DIAGNOSIS — E039 Hypothyroidism, unspecified: Secondary | ICD-10-CM | POA: Diagnosis not present

## 2012-09-23 DIAGNOSIS — I10 Essential (primary) hypertension: Secondary | ICD-10-CM | POA: Diagnosis not present

## 2012-09-23 DIAGNOSIS — M1990 Unspecified osteoarthritis, unspecified site: Secondary | ICD-10-CM | POA: Diagnosis not present

## 2012-09-30 DIAGNOSIS — E039 Hypothyroidism, unspecified: Secondary | ICD-10-CM | POA: Diagnosis not present

## 2012-09-30 DIAGNOSIS — E119 Type 2 diabetes mellitus without complications: Secondary | ICD-10-CM | POA: Diagnosis not present

## 2012-09-30 DIAGNOSIS — R072 Precordial pain: Secondary | ICD-10-CM | POA: Diagnosis not present

## 2012-09-30 DIAGNOSIS — M545 Low back pain: Secondary | ICD-10-CM | POA: Diagnosis not present

## 2012-09-30 DIAGNOSIS — I209 Angina pectoris, unspecified: Secondary | ICD-10-CM | POA: Diagnosis not present

## 2012-09-30 DIAGNOSIS — F321 Major depressive disorder, single episode, moderate: Secondary | ICD-10-CM | POA: Diagnosis not present

## 2012-09-30 DIAGNOSIS — F411 Generalized anxiety disorder: Secondary | ICD-10-CM | POA: Diagnosis not present

## 2012-09-30 DIAGNOSIS — IMO0001 Reserved for inherently not codable concepts without codable children: Secondary | ICD-10-CM | POA: Diagnosis not present

## 2012-10-19 ENCOUNTER — Ambulatory Visit (INDEPENDENT_AMBULATORY_CARE_PROVIDER_SITE_OTHER): Payer: Medicare Other | Admitting: General Surgery

## 2012-10-19 ENCOUNTER — Encounter (INDEPENDENT_AMBULATORY_CARE_PROVIDER_SITE_OTHER): Payer: Self-pay | Admitting: General Surgery

## 2012-10-19 VITALS — BP 130/80 | HR 76 | Resp 16 | Ht 60.0 in | Wt 149.0 lb

## 2012-10-19 DIAGNOSIS — IMO0002 Reserved for concepts with insufficient information to code with codable children: Secondary | ICD-10-CM

## 2012-10-19 DIAGNOSIS — K435 Parastomal hernia without obstruction or  gangrene: Secondary | ICD-10-CM

## 2012-10-19 NOTE — Progress Notes (Signed)
Patient ID: Karen [name], female   DOB: [birth year], 79 y.o.   MRN: [id number]  Chief Complaint  Patient presents with  . New Evaluation    eval hernia next to ileostomy    HPI Karen [name] is a 79 y.o. female.   HPI  I ws asked to see her by Dr. Sharia Reeve regarding a parastomal hernia.  This has been there for at least 2 years. She does have some intermittent pains around that area. She has had the ileostomy ever since her emergency surgery following anastomotic leak from a partial colectomy. She is able to do most of what she wants to do. No obstruction symptoms.  Past Medical History  Diagnosis Date  . Chronic diastolic CHF (congestive heart failure)     a. nl EF by echo 05/2009.  Marland Kitchen DVT of deep femoral vein   . Palpitations   . History of PSVT (paroxysmal supraventricular tachycardia)   . LBBB (left bundle branch block)   . Hypertension   . Abdominal adhesions   . Ileostomy present   . Colon polyp   . Chest pain     a. 2002 Cath: nl cors;  b. 2009 aden mv: nl;  c. 05/2009 Echo: nl.  . Wears dentures   . Breast lump   . Hyperlipidemia   . Thyroid disease   . Cancer     bladder  . Anemia   . History of blood clots   . Hernia   . Blood transfusion   . Hearing loss   . Arthritis   . Colon cancer     x 2. Last 2010  . GERD (gastroesophageal reflux disease)     Past Surgical History  Procedure Laterality Date  . Cardiac catheterization  12/10/2000    THE LEFT VENTRICLE IS MILDY DILATED. THERE IS MILD TO MODERATE DIFFUSE HYPOKINESIS WITH EF 35%  . Colon cancer surgery    . Ileostomy    . Esophagogastroduodenoscopy  02/13/2011    Procedure: ESOPHAGOGASTRODUODENOSCOPY (EGD);  Surgeon: Malissa Hippo, MD;  Location: [facility]O [facility];  Service: Endoscopy;  Laterality: N/A;  1030  . Colonoscopy  09/26/2011    Procedure: COLONOSCOPY;  Surgeon: Malissa Hippo, MD;  Location: [facility];  Service: Endoscopy;  Laterality: N/A;  215  . Cataract extraction w/phaco  11/13/2011   Procedure: CATARACT EXTRACTION PHACO AND INTRAOCULAR LENS PLACEMENT (IOC);  Surgeon: Gemma Payor, MD;  Location: [facility] ORS;  Service: Ophthalmology;  Laterality: Right;  CDE 12.26  . Cataract extraction w/phaco  12/08/2011    Procedure: CATARACT EXTRACTION PHACO AND INTRAOCULAR LENS PLACEMENT (IOC);  Surgeon: Gemma Payor, MD;  Location: [facility] ORS;  Service: Ophthalmology;  Laterality: Left;  CDE 15.11    Family History  Problem Relation Age of Onset  . Stroke    . Hypertension    . Heart disease Mother   . Stroke Mother     deceased  . Cancer Mother     bladder  . Arthritis Mother   . Arthritis Father   . Heart disease Father     decesaed  . Cancer Father     leukemia  . Stroke Sister     alive/debilitated  . Hypertension Sister   . Other Sister     paralysis  . Heart disease Brother     bypass surgery  . Cancer Brother   . Arthritis Brother   . Stroke Sister     alive/debilitated  . Diabetes Sister   .  Other Brother     stomach problems  . Other Brother     bladder    Social History History  Substance Use Topics  . Smoking status: Former Smoker    Quit date: 02/11/1963  . Smokeless tobacco: Never Used  . Alcohol Use: No    Allergies  Allergen Reactions  . Bactrim [Sulfamethoxazole W-Trimethoprim] Shortness Of Breath and Other (See Comments)    Hurting all over  . Lidocaine Hcl Other (See Comments)    tachycardia  . Lisinopril Cough  . Nitrofurantoin Monohyd Macro Other (See Comments)    Unknown   . Ramipril [Ramipril] Cough  . Codeine Palpitations and Other (See Comments)    Heart races  . Demerol Palpitations and Other (See Comments)    tachycardia  . Morphine And Related Palpitations    Tachycardia     Current Outpatient Prescriptions  Medication Sig Dispense Refill  . ALPRAZolam (XANAX) 0.5 MG tablet Take 0.5 mg by mouth at bedtime.       Marland Kitchen amitriptyline (ELAVIL) 10 MG tablet Take 10 mg by mouth 2 (two) times daily.       Marland Kitchen aspirin EC 325 MG tablet  Take 325 mg by mouth daily.      . carvedilol (COREG) 25 MG tablet Take 0.5 tablets (12.5 mg total) by mouth 2 (two) times daily with a meal.  30 tablet  6  . digoxin (LANOXIN) 0.125 MG tablet Take 1 tablet (0.125 mg total) by mouth daily.  30 tablet  5  . fish oil-omega-3 fatty acids 1000 MG capsule Take 2 g by mouth daily.      Marland Kitchen HYDROcodone-acetaminophen (VICODIN) 5-500 MG per tablet Take 1 tablet by mouth every 6 (six) hours as needed. For pain      . levothyroxine (SYNTHROID, LEVOTHROID) 75 MCG tablet Take 75 mcg by mouth daily.       Marland Kitchen losartan-hydrochlorothiazide (HYZAAR) 100-12.5 MG per tablet Take 1 tablet by mouth daily.  90 tablet  3  . Multiple Vitamins-Minerals (EQL GUMMY ADULT) CHEW Chew 2 tablets by mouth daily.       . pantoprazole (PROTONIX) 40 MG tablet Take 40 mg by mouth daily.      . nitroGLYCERIN (NITROSTAT) 0.4 MG SL tablet Place 0.4 mg under the tongue every 5 (five) minutes as needed. For chest pain       No current facility-administered medications for this visit.    Review of Systems Review of Systems  Constitutional: Negative for appetite change.  Cardiovascular: Negative for chest pain.  Gastrointestinal: Positive for abdominal pain.       Right lower quadrant area    Blood pressure 130/80, pulse 76, resp. rate 16, height 5' (1.524 m), weight 149 lb (67.586 kg).  Physical Exam Physical Exam  Constitutional: No distress.  Elderly female  Abdominal: Soft.  Transverse upper abdominal incision. Ileostomy present on the right side. Bulge on the lateral aspect of the ileostomy which is reducible but then comes back out. There is some tenderness at the site.    Data Reviewed Previous CT scans.  Note from Dr. Sharia Reeve and Karilyn Cota.  Assessment    Long-standing parastomal hernia. I do not recommend repair because of the high risk of recurrence as well as her other comorbidities. She is very active now and able to do what she wants. She has no obstruction  symptoms.     Plan    Neurosurgery recommended for parastomal hernia. In fact, I do not recommend she  have any intra-abdominal surgery at all unless it is a dire situation, because I do not think she would do well.        Shaneque Merkle J 10/19/2012, 4:58 PM

## 2012-10-25 DIAGNOSIS — D3130 Benign neoplasm of unspecified choroid: Secondary | ICD-10-CM | POA: Diagnosis not present

## 2012-10-25 DIAGNOSIS — H31019 Macula scars of posterior pole (postinflammatory) (post-traumatic), unspecified eye: Secondary | ICD-10-CM | POA: Diagnosis not present

## 2012-10-25 DIAGNOSIS — H35079 Retinal telangiectasis, unspecified eye: Secondary | ICD-10-CM | POA: Diagnosis not present

## 2012-10-25 DIAGNOSIS — H35349 Macular cyst, hole, or pseudohole, unspecified eye: Secondary | ICD-10-CM | POA: Diagnosis not present

## 2012-10-29 ENCOUNTER — Other Ambulatory Visit: Payer: Self-pay

## 2012-10-29 MED ORDER — DIGOXIN 125 MCG PO TABS: 0.1250 mg | ORAL_TABLET | Freq: Every day | ORAL | Status: DC

## 2012-11-01 DIAGNOSIS — I10 Essential (primary) hypertension: Secondary | ICD-10-CM | POA: Diagnosis not present

## 2012-11-01 DIAGNOSIS — M654 Radial styloid tenosynovitis [de Quervain]: Secondary | ICD-10-CM | POA: Diagnosis not present

## 2012-11-03 DIAGNOSIS — Z23 Encounter for immunization: Secondary | ICD-10-CM | POA: Diagnosis not present

## 2012-11-13 ENCOUNTER — Encounter: Payer: Self-pay | Admitting: Hematology and Oncology

## 2012-11-16 DIAGNOSIS — I10 Essential (primary) hypertension: Secondary | ICD-10-CM | POA: Diagnosis not present

## 2012-11-16 DIAGNOSIS — M25539 Pain in unspecified wrist: Secondary | ICD-10-CM | POA: Diagnosis not present

## 2012-12-09 ENCOUNTER — Ambulatory Visit (INDEPENDENT_AMBULATORY_CARE_PROVIDER_SITE_OTHER): Payer: Medicare Other | Admitting: Cardiology

## 2012-12-09 ENCOUNTER — Encounter: Payer: Self-pay | Admitting: Cardiology

## 2012-12-09 VITALS — BP 112/64 | HR 73 | Ht 60.0 in | Wt 147.0 lb

## 2012-12-09 DIAGNOSIS — I471 Supraventricular tachycardia, unspecified: Secondary | ICD-10-CM

## 2012-12-09 DIAGNOSIS — I447 Left bundle-branch block, unspecified: Secondary | ICD-10-CM | POA: Diagnosis not present

## 2012-12-09 DIAGNOSIS — I200 Unstable angina: Secondary | ICD-10-CM | POA: Diagnosis not present

## 2012-12-09 DIAGNOSIS — I119 Hypertensive heart disease without heart failure: Secondary | ICD-10-CM | POA: Diagnosis not present

## 2012-12-09 NOTE — Patient Instructions (Signed)
Your physician recommends that you continue on your current medications as directed. Please refer to the Current Medication list given to you today.  Your physician wants you to follow-up in: 6 month ov/ekg You will receive a reminder letter in the mail two months in advance. If you don't receive a letter, please call our office to schedule the follow-up appointment.  

## 2012-12-09 NOTE — Assessment & Plan Note (Signed)
The patient has not been experiencing any recurrent chest pain or angina pectoris. 

## 2012-12-09 NOTE — Assessment & Plan Note (Signed)
Blood pressure was remaining stable on current medication 

## 2012-12-09 NOTE — Assessment & Plan Note (Signed)
The patient continues to have very brief episodes of racing of her heart.  The episodes last only a few seconds.  She has extra medication on hand but normally does not have to take it

## 2012-12-09 NOTE — Progress Notes (Signed)
Karen [name] Date of Birth:  06-24-1929 [address] [place], [place]  [address] [phone]         Fax   [phone]  History of Present Illness: This pleasant 79 year old woman is seen for a scheduled followup office visit. She has a complex past medical history. She has a known left bundle branch block. She does not have coronary disease. She had a normal coronary arteriogram in 2002 and a normal adenosine Cardiolite stress test in 2009. She had an echocardiogram in April 2011 showing normal systolic function with diastolic dysfunction.she was seen in the office last month with chest and jaw pain and underwent a Lexus scan Myoview stress test on 06/03/12 which was normal and showed an ejection fraction of 69% and no ischemia. He's had a past history of swallowing problems secondary to achalasia and has had successful endoscopic injections of Botox into her esophagus. She has a remote history of a severe intra-abdominal infection resulting in the need for an ileostomy.  She has a past history of colon cancer and colon polyps. She has a history of hypothyroidism.   Current Outpatient Prescriptions  Medication Sig Dispense Refill  . ALPRAZolam (XANAX) 0.5 MG tablet Take 0.5 mg by mouth at bedtime.       Marland Kitchen amitriptyline (ELAVIL) 10 MG tablet Take 10 mg by mouth 2 (two) times daily.       Marland Kitchen aspirin EC 325 MG tablet Take 325 mg by mouth daily.      . carvedilol (COREG) 25 MG tablet Take 0.5 tablets (12.5 mg total) by mouth 2 (two) times daily with a meal.  30 tablet  6  . digoxin (LANOXIN) 0.125 MG tablet Take 1 tablet (0.125 mg total) by mouth daily.  30 tablet  5  . fish oil-omega-3 fatty acids 1000 MG capsule Take 2 g by mouth daily.      Marland Kitchen HYDROcodone-acetaminophen (VICODIN) 5-500 MG per tablet Take 1 tablet by mouth every 6 (six) hours as needed. For pain      . levothyroxine (SYNTHROID, LEVOTHROID) 75 MCG tablet Take 75 mcg by mouth daily.       Marland Kitchen  losartan-hydrochlorothiazide (HYZAAR) 100-12.5 MG per tablet Take 1 tablet by mouth daily.  90 tablet  3  . Multiple Vitamins-Minerals (EQL GUMMY ADULT) CHEW Chew 2 tablets by mouth daily.       . nitroGLYCERIN (NITROSTAT) 0.4 MG SL tablet Place 0.4 mg under the tongue every 5 (five) minutes as needed. For chest pain      . pantoprazole (PROTONIX) 40 MG tablet Take 40 mg by mouth daily.       No current facility-administered medications for this visit.    Allergies  Allergen Reactions  . Bactrim [Sulfamethoxazole-Trimethoprim] Shortness Of Breath and Other (See Comments)    Hurting all over  . Lidocaine Hcl Other (See Comments)    tachycardia  . Lisinopril Cough  . Nitrofurantoin Monohyd Macro Other (See Comments)    Unknown   . Ramipril [Ramipril] Cough  . Codeine Palpitations and Other (See Comments)    Heart races  . Demerol Palpitations and Other (See Comments)    tachycardia  . Morphine And Related Palpitations    Tachycardia     Patient Active Problem List   Diagnosis Date Noted  . Parastomal hernia 10/19/2012  . Unstable angina 05/24/2012  . Colon cancer 04/29/2012  . Ileostomy status 06/04/2011  . Paroxysmal SVT (supraventricular tachycardia) 05/06/2011  . Dysphagia 12/30/2010  .  Small bowel obstruction, partial 09/03/2010  . Left bundle branch block 07/24/2010  . Chest pain, exertional 07/24/2010  . Benign hypertensive heart disease without heart failure 07/24/2010  . Hypothyroid 07/24/2010    History  Smoking status  . Former Smoker  . Quit date: 02/11/1963  Smokeless tobacco  . Never Used    History  Alcohol Use No    Family History  Problem Relation Age of Onset  . Stroke    . Hypertension    . Heart disease Mother   . Stroke Mother     deceased  . Cancer Mother     bladder  . Arthritis Mother   . Arthritis Father   . Heart disease Father     decesaed  . Cancer Father     leukemia  . Stroke Sister     alive/debilitated  .  Hypertension Sister   . Other Sister     paralysis  . Heart disease Brother     bypass surgery  . Cancer Brother   . Arthritis Brother   . Stroke Sister     alive/debilitated  . Diabetes Sister   . Other Brother     stomach problems  . Other Brother     bladder    Review of Systems: Constitutional: no fever chills diaphoresis or fatigue or change in weight.  Head and neck: no hearing loss, no epistaxis, no photophobia or visual disturbance. Respiratory: No cough, shortness of breath or wheezing. Cardiovascular: No chest pain peripheral edema, palpitations. Gastrointestinal: No abdominal distention, no abdominal pain, no change in bowel habits hematochezia or melena. Genitourinary: No dysuria, no frequency, no urgency, no nocturia. Musculoskeletal:No arthralgias, no back pain, no gait disturbance or myalgias. Neurological: No dizziness, no headaches, no numbness, no seizures, no syncope, no weakness, no tremors. Hematologic: No lymphadenopathy, no easy bruising. Psychiatric: No confusion, no hallucinations, no sleep disturbance.    Physical Exam: Filed Vitals:   12/09/12 1016  BP: 112/64  Pulse: 73  the general appearance reveals a well-developed well-nourished woman in no distress.The head and neck exam reveals pupils equal and reactive.  Extraocular movements are full.  There is no scleral icterus.  The mouth and pharynx are normal.  The neck is supple.  The carotids reveal no bruits.  The jugular venous pressure is normal.  The  thyroid is not enlarged.  There is no lymphadenopathy.  The chest is clear to percussion and auscultation.  There are no rales or rhonchi.  Expansion of the chest is symmetrical.  The precordium is quiet.  The first heart sound is normal.  The second heart sound is physiologically split.  There is no murmur gallop rub or click.  There is no abnormal lift or heave.  The abdomen is soft and nontender.ileostomy present  The bowel sounds are normal.  The  liver and spleen are not enlarged.  There are no abdominal masses.  There are no abdominal bruits.  Extremities reveal good pedal pulses.  There is no phlebitis or edema.  There is no cyanosis or clubbing.  Strength is normal and symmetrical in all extremities.  There is no lateralizing weakness.  There are no sensory deficits.  The skin is warm and dry.  There is no rash.  EKG today shows normal sinus rhythm with left bundle branch block and secondary ST-T wave changes.    Assessment / Plan:  Continue same medication.  Recheck in 6 months for followup office visit and EKG. she continues to have  a lot of GI issues and musculoskeletal pain.  She states that her surgeon has not recommended any further GI surgery unless it was a true emergency.

## 2013-01-04 ENCOUNTER — Ambulatory Visit (HOSPITAL_COMMUNITY)
Admission: RE | Admit: 2013-01-04 | Discharge: 2013-01-04 | Disposition: A | Discharge status: Home / Self Care | Payer: Medicare Other | Source: Ambulatory Visit | Attending: Hematology and Oncology | Admitting: Hematology and Oncology

## 2013-01-04 ENCOUNTER — Ambulatory Visit (HOSPITAL_COMMUNITY): Payer: Medicare Other

## 2013-01-04 ENCOUNTER — Encounter (HOSPITAL_COMMUNITY): Payer: Self-pay

## 2013-01-04 DIAGNOSIS — Z9049 Acquired absence of other specified parts of digestive tract: Secondary | ICD-10-CM | POA: Diagnosis not present

## 2013-01-04 DIAGNOSIS — R9389 Abnormal findings on diagnostic imaging of other specified body structures: Secondary | ICD-10-CM | POA: Diagnosis not present

## 2013-01-04 DIAGNOSIS — R918 Other nonspecific abnormal finding of lung field: Secondary | ICD-10-CM | POA: Diagnosis not present

## 2013-01-04 DIAGNOSIS — M545 Low back pain, unspecified: Secondary | ICD-10-CM | POA: Insufficient documentation

## 2013-01-04 DIAGNOSIS — Z85038 Personal history of other malignant neoplasm of large intestine: Secondary | ICD-10-CM | POA: Diagnosis not present

## 2013-01-04 DIAGNOSIS — K559 Vascular disorder of intestine, unspecified: Secondary | ICD-10-CM | POA: Insufficient documentation

## 2013-01-04 DIAGNOSIS — N2889 Other specified disorders of kidney and ureter: Secondary | ICD-10-CM | POA: Diagnosis not present

## 2013-01-04 DIAGNOSIS — I517 Cardiomegaly: Secondary | ICD-10-CM | POA: Insufficient documentation

## 2013-01-04 DIAGNOSIS — C189 Malignant neoplasm of colon, unspecified: Secondary | ICD-10-CM

## 2013-01-04 MED ORDER — IOHEXOL 300 MG/ML  SOLN
100.0000 mL | Freq: Once | INTRAMUSCULAR | Status: AC | PRN
  Administered 2013-01-04: 80 mL via INTRAVENOUS

## 2013-01-05 ENCOUNTER — Other Ambulatory Visit (HOSPITAL_COMMUNITY): Payer: Self-pay | Admitting: Oncology

## 2013-01-05 ENCOUNTER — Encounter (HOSPITAL_COMMUNITY): Payer: Medicare Other | Attending: Hematology and Oncology

## 2013-01-05 ENCOUNTER — Encounter (HOSPITAL_COMMUNITY): Payer: Self-pay

## 2013-01-05 VITALS — BP 111/71 | HR 80 | Temp 97.3°F | Resp 18 | Wt 147.6 lb

## 2013-01-05 DIAGNOSIS — C679 Malignant neoplasm of bladder, unspecified: Secondary | ICD-10-CM

## 2013-01-05 DIAGNOSIS — Z09 Encounter for follow-up examination after completed treatment for conditions other than malignant neoplasm: Secondary | ICD-10-CM | POA: Diagnosis not present

## 2013-01-05 DIAGNOSIS — Z85038 Personal history of other malignant neoplasm of large intestine: Secondary | ICD-10-CM

## 2013-01-05 DIAGNOSIS — Z139 Encounter for screening, unspecified: Secondary | ICD-10-CM

## 2013-01-05 DIAGNOSIS — Z932 Ileostomy status: Secondary | ICD-10-CM

## 2013-01-05 DIAGNOSIS — C189 Malignant neoplasm of colon, unspecified: Secondary | ICD-10-CM

## 2013-01-05 DIAGNOSIS — E876 Hypokalemia: Secondary | ICD-10-CM

## 2013-01-05 LAB — COMPREHENSIVE METABOLIC PANEL
AST: 34 U/L (ref 0–37)
Albumin: 3.7 g/dL (ref 3.5–5.2)
CO2: 28 mEq/L (ref 19–32)
Calcium: 9.6 mg/dL (ref 8.4–10.5)
Chloride: 101 mEq/L (ref 96–112)
GFR calc Af Amer: 43 mL/min — ABNORMAL LOW (ref >90)
GFR calc non Af Amer: 37 mL/min — ABNORMAL LOW (ref >90)
Glucose, Bld: 148 mg/dL — ABNORMAL HIGH (ref 70–99)
Potassium: 3.4 mEq/L — ABNORMAL LOW (ref 3.5–5.1)
Sodium: 141 mEq/L (ref 135–145)
Total Bilirubin: 0.4 mg/dL (ref 0.3–1.2)

## 2013-01-05 LAB — CBC WITH DIFFERENTIAL/PLATELET
Eosinophils Absolute: 0.1 10*3/uL (ref 0.0–0.7)
Hemoglobin: 11.5 g/dL — ABNORMAL LOW (ref 12.0–15.0)
Lymphocytes Relative: 21 % (ref 12–46)
Lymphs Abs: 1.1 10*3/uL (ref 0.7–4.0)
MCH: 30.2 pg (ref 26.0–34.0)
MCV: 90.3 fL (ref 78.0–100.0)
Monocytes Relative: 10 % (ref 3–12)
Neutrophils Relative %: 66 % (ref 43–77)
Platelets: 194 10*3/uL (ref 150–400)
RBC: 3.81 MIL/uL — ABNORMAL LOW (ref 3.87–5.11)
RDW: 13.2 % (ref 11.5–15.5)
WBC: 5.3 10*3/uL (ref 4.0–10.5)

## 2013-01-05 MED ORDER — POTASSIUM CHLORIDE CRYS ER 20 MEQ PO TBCR: 20.0000 meq | EXTENDED_RELEASE_TABLET | Freq: Every day | ORAL | Status: DC

## 2013-01-05 NOTE — Progress Notes (Signed)
[facility]  OFFICE PROGRESS NOTE  Estanislado Pandy, MD [address] [place] [address]  DIAGNOSIS: Colon cancer - Plan: Comprehensive metabolic panel, CBC with Differential, CEA  Malignant neoplasm of bladder, part unspecified  Screening - Plan: CT Temporal Bones W/O CM  Chief Complaint  Patient presents with  . Colon Cancer    CURRENT THERAPY: watchful expectation.  INTERVAL HISTORY: Karen [name] 79 y.o. female returns for followup of multiple colon cancers, status post resection with ultimate ileostomy but never having received any chemotherapy.  Because of suspicious findings on virtual colonoscopy done in April of 2014, the patient underwent CT scanning on 01/04/2013 with ileostomy obviously present with a lower left pole kidney cyst that was slightly larger but with no evidence of metastatic disease or evidence of gross primary. Primary problem is low back pain radiating bilaterally for which she takes 1 hydrocodone at bedtime and sometimes one in the morning. Ileostomy is functioning well with no bleeding or pain. She denies any abdominal distention, PND, orthopnea, palpitations, headache, night sweats, lower extremity swelling that persists, vaginal bleeding, or incontinence.she also denies any urinary hesitancy, incontinence, or gross hematuria.  MEDICAL HISTORY: Past Medical History  Diagnosis Date  . Chronic diastolic CHF (congestive heart failure)     a. nl EF by echo 05/2009.  Marland Kitchen DVT of deep femoral vein   . Palpitations   . History of PSVT (paroxysmal supraventricular tachycardia)   . LBBB (left bundle branch block)   . Hypertension   . Abdominal adhesions   . Ileostomy present   . Colon polyp   . Chest pain     a. 2002 Cath: nl cors;  b. 2009 aden mv: nl;  c. 05/2009 Echo: nl.  . Wears dentures   . Breast lump   . Hyperlipidemia   . Thyroid disease   . Anemia   . History of blood clots   . Hernia   . Blood  transfusion   . Hearing loss   . Arthritis   . GERD (gastroesophageal reflux disease)   . Cancer     bladder  . Colon cancer     x 2. Last 2010    INTERIM HISTORY: has Left bundle branch block; Chest pain, exertional; Benign hypertensive heart disease without heart failure; Hypothyroid; Small bowel obstruction, partial; Dysphagia; Paroxysmal SVT (supraventricular tachycardia); Ileostomy status; Colon cancer; Unstable angina; and Parastomal hernia on her problem list.   First colon cancer in 1989 and was treated with surgery at [facility] . She states that subsequently she was diagnosed with bladder cancer 08/2008 also treated at [facility]. Then in July of 2010 she was diagnosed with a second colon cancer and was hospitalized at [facility] But later sent to [facility] where she underwent surgery x3 and now has an ileostomy.  She reports that she was never treated with adjuvant chemotherapy in of the cancers.  ALLERGIES:  is allergic to bactrim; lidocaine hcl; lisinopril; nitrofurantoin monohyd macro; ramipril; codeine; demerol; and morphine and related.  MEDICATIONS: has a current medication list which includes the following prescription(s): alprazolam, amitriptyline, aspirin ec, carvedilol, digoxin, fish oil-omega-3 fatty acids, hydrocodone-acetaminophen, levothyroxine, losartan-hydrochlorothiazide, eql gummy adult, nitroglycerin, and pantoprazole.  SURGICAL HISTORY:  Past Surgical History  Procedure Laterality Date  . Cardiac catheterization  12/10/2000    THE LEFT VENTRICLE IS MILDY DILATED. THERE IS MILD TO MODERATE DIFFUSE HYPOKINESIS WITH EF 35%  . Colon cancer surgery    .  Ileostomy    . Esophagogastroduodenoscopy  02/13/2011    Procedure: ESOPHAGOGASTRODUODENOSCOPY (EGD);  Surgeon: Malissa Hippo, MD;  Location: [facility] ENDO SUITE;  Service: Endoscopy;  Laterality: N/A;  1030  . Colonoscopy  09/26/2011    Procedure: COLONOSCOPY;  Surgeon: Malissa Hippo, MD;  Location: [facility]  ENDO SUITE;  Service: Endoscopy;  Laterality: N/A;  215  . Cataract extraction w/phaco  11/13/2011    Procedure: CATARACT EXTRACTION PHACO AND INTRAOCULAR LENS PLACEMENT (IOC);  Surgeon: Gemma Payor, MD;  Location: [facility] ORS;  Service: Ophthalmology;  Laterality: Right;  CDE 12.26  . Cataract extraction w/phaco  12/08/2011    Procedure: CATARACT EXTRACTION PHACO AND INTRAOCULAR LENS PLACEMENT (IOC);  Surgeon: Gemma Payor, MD;  Location: [facility] ORS;  Service: Ophthalmology;  Laterality: Left;  CDE 15.11    FAMILY HISTORY: family history includes Arthritis in her brother, father, and mother; Cancer in her brother, father, and mother; Diabetes in her sister; Heart disease in her brother, father, and mother; Hypertension in her sister and another family member; Other in her brother, brother, and sister; Stroke in her mother, sister, sister, and another family member.  SOCIAL HISTORY:  reports that she quit smoking about 49 years ago. She has never used smokeless tobacco. She reports that she does not drink alcohol or use illicit drugs.  REVIEW OF SYSTEMS:  Other than that discussed above is noncontributory.  PHYSICAL EXAMINATION: ECOG PERFORMANCE STATUS: 1 - Symptomatic but completely ambulatory  Blood pressure 111/71, pulse 80, temperature 97.3 F (36.3 C), temperature source Oral, resp. rate 18, weight 147 lb 9.6 oz (66.951 kg).  GENERAL:alert, no distress and comfortable SKIN: skin color, texture, turgor are normal, no rashes or significant lesions EYES: PERLA; Conjunctiva are pink and non-injected, sclera clear OROPHARYNX:no exudate, no erythema on lips, buccal mucosa, or tongue. NECK: supple, thyroid normal size, non-tender, without nodularity. No masses CHEST: normal AP diameter with no breast masses. LYMPH:  no palpable lymphadenopathy in the cervical, axillary or inguinal LUNGS: clear to auscultation and percussion with normal breathing effort HEART: regular rate & rhythm and no  murmurs.paradoxical split S2. ABDOMEN:abdomen soft, non-tender and normal bowel sounds. Ileostomy in place with no free fluid wave or shifting dullness at MUSCULOSKELETAL:no cyanosis of digits and no clubbing. Range of motion normal.  NEURO: alert & oriented x 3 with fluent speech, no focal motor/sensory deficits   LABORATORY DATA: Office Visit on 01/05/2013  Component Date Value Range Status  . Sodium 01/05/2013 141  135 - 145 mEq/L Final  . Potassium 01/05/2013 3.4* 3.5 - 5.1 mEq/L Final  . Chloride 01/05/2013 101  96 - 112 mEq/L Final  . CO2 01/05/2013 28  19 - 32 mEq/L Final  . Glucose, Bld 01/05/2013 148* 70 - 99 mg/dL Final  . BUN 16/11/9602 23  6 - 23 mg/dL Final  . Creatinine, Ser 01/05/2013 1.32* 0.50 - 1.10 mg/dL Final  . Calcium 54/10/8117 9.6  8.4 - 10.5 mg/dL Final  . Total Protein 01/05/2013 7.5  6.0 - 8.3 g/dL Final  . Albumin 14/78/2956 3.7  3.5 - 5.2 g/dL Final  . AST 21/30/8657 34  0 - 37 U/L Final  . ALT 01/05/2013 29  0 - 35 U/L Final  . Alkaline Phosphatase 01/05/2013 76  39 - 117 U/L Final  . Total Bilirubin 01/05/2013 0.4  0.3 - 1.2 mg/dL Final  . GFR calc non Af Amer 01/05/2013 37* >90 mL/min Final  . GFR calc Af Amer 01/05/2013 43* >90 mL/min Final  Comment: (NOTE)                          The eGFR has been calculated using the CKD EPI equation.                          This calculation has not been validated in all clinical situations.                          eGFR's persistently <90 mL/min signify possible Chronic Kidney                          Disease.  . WBC 01/05/2013 5.3  4.0 - 10.5 K/uL Final  . RBC 01/05/2013 3.81* 3.87 - 5.11 MIL/uL Final  . Hemoglobin 01/05/2013 11.5* 12.0 - 15.0 g/dL Final  . HCT 16/11/9602 34.4* 36.0 - 46.0 % Final  . MCV 01/05/2013 90.3  78.0 - 100.0 fL Final  . MCH 01/05/2013 30.2  26.0 - 34.0 pg Final  . MCHC 01/05/2013 33.4  30.0 - 36.0 g/dL Final  . RDW 54/10/8117 13.2  11.5 - 15.5 % Final  . Platelets 01/05/2013 194   150 - 400 K/uL Final  . Neutrophils Relative % 01/05/2013 66  43 - 77 % Final  . Neutro Abs 01/05/2013 3.5  1.7 - 7.7 K/uL Final  . Lymphocytes Relative 01/05/2013 21  12 - 46 % Final  . Lymphs Abs 01/05/2013 1.1  0.7 - 4.0 K/uL Final  . Monocytes Relative 01/05/2013 10  3 - 12 % Final  . Monocytes Absolute 01/05/2013 0.5  0.1 - 1.0 K/uL Final  . Eosinophils Relative 01/05/2013 3  0 - 5 % Final  . Eosinophils Absolute 01/05/2013 0.1  0.0 - 0.7 K/uL Final  . Basophils Relative 01/05/2013 0  0 - 1 % Final  . Basophils Absolute 01/05/2013 0.0  0.0 - 0.1 K/uL Final  Hospital Outpatient Visit on 01/04/2013  Component Date Value Range Status  . Creatinine, Ser 01/04/2013 1.50* 0.50 - 1.10 mg/dL Final    PATHOLOGY:none new  Urinalysis    Component Value Date/Time   COLORURINE YELLOW 08/08/2010 1802   APPEARANCEUR CLEAR 08/08/2010 1802   LABSPEC >1.046* 08/08/2010 1802   PHURINE 6.0 08/08/2010 1802   GLUCOSEU NEGATIVE 08/08/2010 1802   HGBUR NEGATIVE 08/08/2010 1802   BILIRUBINUR NEGATIVE 08/08/2010 1802   KETONESUR NEGATIVE 08/08/2010 1802   PROTEINUR NEGATIVE 08/08/2010 1802   UROBILINOGEN 0.2 08/08/2010 1802   NITRITE NEGATIVE 08/08/2010 1802   LEUKOCYTESUR NEGATIVE 08/08/2010 1802    RADIOGRAPHIC STUDIES: Ct Chest W Contrast  01/04/2013   CLINICAL DATA:  History bladder colon cancer. History mesenteric ischemia  EXAM: CT CHEST, ABDOMEN, AND PELVIS WITH CONTRAST  TECHNIQUE: Multidetector CT imaging of the chest, abdomen and pelvis was performed following the standard protocol during bolus administration of intravenous contrast.  CONTRAST:  80mL OMNIPAQUE IOHEXOL 300 MG/ML  SOLN  COMPARISON:  CT of the abdomen and pelvis 06/14/2012. Chest CT 09/20/2009.  FINDINGS: CT CHEST FINDINGS  Mediastinum: Heart size is mildly enlarged. There is no significant pericardial fluid, thickening or pericardial calcification. No pathologically enlarged mediastinal or hilar lymph nodes. Esophagus is unremarkable in  appearance.  Lungs/Pleura: No suspicious appearing pulmonary nodules or masses. No acute consolidative airspace disease. No pleural effusions. Mild scarring or subsegmental atelectasis in the right lower lobe.  Musculoskeletal:  There are no aggressive appearing lytic or blastic lesions noted in the visualized portions of the skeleton.  CT ABDOMEN AND PELVIS FINDINGS  Abdomen/Pelvis: Status post cholecystectomy. The appearance of the liver, pancreas, spleen and bilateral adrenal glands is unremarkable. Extending exophytically from the anterior aspect of the lower pole of the left kidney there is a 10 mm high attenuation lesion (80-82 HU), which appears only slightly larger than an prior study from 06/14/2012, but has increased in size compared to more remote prior CT scan 08/08/2010. 2.0 cm low-attenuation nonenhancing lesion in the interpolar region of the right kidney is similar, compatible with a simple cyst.  Assessment of the anatomic pelvis is significantly limited by extensive beam hardening artifact from the patient's left total hip arthroplasty. With these limitations in mind, there is no definite significant volume of ascites. No pneumoperitoneum. No pathologic distention of small bowel. Status post right hemicolectomy. Right lower quadrant ileostomy. Diffuse laxity of the musculature in the right anterior abdominal wall resulting and mild herniation of small bowel, without evidence incarceration obstruction at this time. No pneumoperitoneum. No definite lymphadenopathy identified within the abdomen or pelvis. Atherosclerosis throughout the abdominal and pelvic vasculature, without definite aneurysm. Status post hysterectomy. Ovaries are not confidently identified may be surgically absent or atrophic.  Musculoskeletal: Status post left total hip arthroplasty. There are no aggressive appearing lytic or blastic lesions noted in the visualized portions of the skeleton.  IMPRESSION: 1. Status post right  hemicolectomy and placement of right lower quadrant ileostomy, without evidence to suggest metastatic disease in the chest, abdomen or pelvis. 2. 1 cm indeterminate lesion in the lower pole of the left kidney appears slightly larger than prior examinations, as discussed above. While this may simply represent slight growth of a proteinaceous or hemorrhagic cyst, the possibility of a tiny solid neoplasm is not excluded. Attention on followup studies is recommended. If definitive diagnosis is required at this time, this could be further evaluated with an MRI of the abdomen with and without IV gadolinium. 3. Status post cholecystectomy, hysterectomy and left hip total arthroplasty.   Electronically Signed   By: Trudie Reed M.D.   On: 01/04/2013 11:36   Ct Abdomen Pelvis W Contrast  01/04/2013   CLINICAL DATA:  History bladder colon cancer. History mesenteric ischemia  EXAM: CT CHEST, ABDOMEN, AND PELVIS WITH CONTRAST  TECHNIQUE: Multidetector CT imaging of the chest, abdomen and pelvis was performed following the standard protocol during bolus administration of intravenous contrast.  CONTRAST:  80mL OMNIPAQUE IOHEXOL 300 MG/ML  SOLN  COMPARISON:  CT of the abdomen and pelvis 06/14/2012. Chest CT 09/20/2009.  FINDINGS: CT CHEST FINDINGS  Mediastinum: Heart size is mildly enlarged. There is no significant pericardial fluid, thickening or pericardial calcification. No pathologically enlarged mediastinal or hilar lymph nodes. Esophagus is unremarkable in appearance.  Lungs/Pleura: No suspicious appearing pulmonary nodules or masses. No acute consolidative airspace disease. No pleural effusions. Mild scarring or subsegmental atelectasis in the right lower lobe.  Musculoskeletal: There are no aggressive appearing lytic or blastic lesions noted in the visualized portions of the skeleton.  CT ABDOMEN AND PELVIS FINDINGS  Abdomen/Pelvis: Status post cholecystectomy. The appearance of the liver, pancreas, spleen and  bilateral adrenal glands is unremarkable. Extending exophytically from the anterior aspect of the lower pole of the left kidney there is a 10 mm high attenuation lesion (80-82 HU), which appears only slightly larger than an prior study from 06/14/2012, but has increased in size compared to more remote prior CT  scan 08/08/2010. 2.0 cm low-attenuation nonenhancing lesion in the interpolar region of the right kidney is similar, compatible with a simple cyst.  Assessment of the anatomic pelvis is significantly limited by extensive beam hardening artifact from the patient's left total hip arthroplasty. With these limitations in mind, there is no definite significant volume of ascites. No pneumoperitoneum. No pathologic distention of small bowel. Status post right hemicolectomy. Right lower quadrant ileostomy. Diffuse laxity of the musculature in the right anterior abdominal wall resulting and mild herniation of small bowel, without evidence incarceration obstruction at this time. No pneumoperitoneum. No definite lymphadenopathy identified within the abdomen or pelvis. Atherosclerosis throughout the abdominal and pelvic vasculature, without definite aneurysm. Status post hysterectomy. Ovaries are not confidently identified may be surgically absent or atrophic.  Musculoskeletal: Status post left total hip arthroplasty. There are no aggressive appearing lytic or blastic lesions noted in the visualized portions of the skeleton.  IMPRESSION: 1. Status post right hemicolectomy and placement of right lower quadrant ileostomy, without evidence to suggest metastatic disease in the chest, abdomen or pelvis. 2. 1 cm indeterminate lesion in the lower pole of the left kidney appears slightly larger than prior examinations, as discussed above. While this may simply represent slight growth of a proteinaceous or hemorrhagic cyst, the possibility of a tiny solid neoplasm is not excluded. Attention on followup studies is recommended. If  definitive diagnosis is required at this time, this could be further evaluated with an MRI of the abdomen with and without IV gadolinium. 3. Status post cholecystectomy, hysterectomy and left hip total arthroplasty.   Electronically Signed   By: Trudie Reed M.D.   On: 01/04/2013 11:36    ASSESSMENT:  #1.Multiple colon cancers she was surgery alone, now with ileostomy, no evidence of disease. #2. Left kidney lower pole cyst. #3. Degenerative joint disease involving the spine, symptomatic. #4. Previous history of DVT of the left femoral vein. #5. Left bundle branch block. #6.hypothyroidism. #7. Gastroesophageal reflux disease, stable.   PLAN:  #1. Patient was instructed to take analgesics every 4 hours while awake and 2 at bedtime. This will not only 8 in control of her pain but also will decrease the output from her ileostomy. #2. Followup in 6 months with lab tests. She was told to call should any new symptoms occur that are troublesome and persistent.   All questions were answered. The patient knows to call the clinic with any problems, questions or concerns. We can certainly see the patient much sooner if necessary.   I spent 25 minutes counseling the patient face to face. The total time spent in the appointment was 30 minutes.    Maurilio Lovely, MD 01/05/2013 12:02 PM

## 2013-01-05 NOTE — Patient Instructions (Signed)
.  [facility] Discharge Instructions  RECOMMENDATIONS MADE BY THE CONSULTANT AND ANY TEST RESULTS WILL BE SENT TO YOUR REFERRING PHYSICIAN.  EXAM FINDINGS BY THE PHYSICIAN TODAY AND SIGNS OR SYMPTOMS TO REPORT TO CLINIC OR PRIMARY PHYSICIAN: Exam and findings as discussed by Dr. Zigmund Daniel.  Take your pain med q 4 hrs and 2 at bedtime  INSTRUCTIONS/FOLLOW-UP: 6 months  Thank you for choosing [facility] to provide your oncology and hematology care.  To afford each patient quality time with our providers, please arrive at least 15 minutes before your scheduled appointment time.  With your help, our goal is to use those 15 minutes to complete the necessary work-up to ensure our physicians have the information they need to help with your evaluation and healthcare recommendations.    Effective January 1st, 2014, we ask that you re-schedule your appointment with our physicians should you arrive 10 or more minutes late for your appointment.  We strive to give you quality time with our providers, and arriving late affects you and other patients whose appointments are after yours.    Again, thank you for choosing [facility].  Our hope is that these requests will decrease the amount of time that you wait before being seen by our physicians.       _____________________________________________________________  Should you have questions after your visit to [facility], please contact our office at [phone] between the hours of 8:30 a.m. and 5:00 p.m.  Voicemails left after 4:30 p.m. will not be returned until the following business day.  For prescription refill requests, have your pharmacy contact our office with your prescription refill request.

## 2013-01-06 LAB — CEA: CEA: 1.5 ng/mL (ref 0.0–5.0)

## 2013-01-14 ENCOUNTER — Other Ambulatory Visit (HOSPITAL_COMMUNITY): Payer: Self-pay | Admitting: Oncology

## 2013-01-14 ENCOUNTER — Telehealth (HOSPITAL_COMMUNITY): Payer: Self-pay | Admitting: *Deleted

## 2013-01-14 DIAGNOSIS — E876 Hypokalemia: Secondary | ICD-10-CM

## 2013-01-14 MED ORDER — POTASSIUM CHLORIDE 20 MEQ/15ML (10%) PO LIQD: 40.0000 meq | Freq: Every day | ORAL | Status: DC

## 2013-01-17 DIAGNOSIS — E119 Type 2 diabetes mellitus without complications: Secondary | ICD-10-CM | POA: Diagnosis not present

## 2013-01-17 DIAGNOSIS — E039 Hypothyroidism, unspecified: Secondary | ICD-10-CM | POA: Diagnosis not present

## 2013-01-17 DIAGNOSIS — E7800 Pure hypercholesterolemia, unspecified: Secondary | ICD-10-CM | POA: Diagnosis not present

## 2013-01-17 DIAGNOSIS — I10 Essential (primary) hypertension: Secondary | ICD-10-CM | POA: Diagnosis not present

## 2013-01-24 ENCOUNTER — Ambulatory Visit (HOSPITAL_COMMUNITY)
Admission: RE | Admit: 2013-01-24 | Discharge: 2013-01-24 | Disposition: A | Discharge status: Home / Self Care | Payer: Medicare Other | Source: Ambulatory Visit | Attending: Internal Medicine | Admitting: Internal Medicine

## 2013-01-24 ENCOUNTER — Ambulatory Visit (INDEPENDENT_AMBULATORY_CARE_PROVIDER_SITE_OTHER): Payer: Medicare Other | Admitting: Internal Medicine

## 2013-01-24 ENCOUNTER — Encounter (INDEPENDENT_AMBULATORY_CARE_PROVIDER_SITE_OTHER): Payer: Self-pay | Admitting: Internal Medicine

## 2013-01-24 VITALS — BP 130/70 | HR 72 | Temp 98.0°F | Resp 18 | Ht 60.0 in | Wt 148.9 lb

## 2013-01-24 DIAGNOSIS — K220 Achalasia of cardia: Secondary | ICD-10-CM | POA: Diagnosis not present

## 2013-01-24 DIAGNOSIS — Z85038 Personal history of other malignant neoplasm of large intestine: Secondary | ICD-10-CM

## 2013-01-24 DIAGNOSIS — R1310 Dysphagia, unspecified: Secondary | ICD-10-CM | POA: Diagnosis not present

## 2013-01-24 NOTE — Progress Notes (Signed)
Presenting complaint;  Dysphagia.  Subjective:  Patient is 79 year old Caucasian female with history of a chalasia and colon carcinoma who is here for scheduled visit. She was last seen 6 months ago. She was found to have filling defects in the excluded segment of sigmoid colon not reachable even with slim scope. She was referred oncologist. She had chest and abdominal pelvic CT last month and no suspicious areas were seen. She now presents with dysphagia to solids liquids and pills. She has undergone Botox injection therapy twice initially at [facility] and more recently in January 2013 at [facility]. She states it helped for several months. Dysphagia started several weeks ago and occurring more and more frequently. She was given potassium pills by Dr. Zigmund Daniel for hypokalemia and had difficulty swallowing these pills. Yesterday she choked while eating pizza. At times she has difficulty with liquids. She denies heartburn. She has gained 8 pounds as her last visit 6 months ago. She denies melena or bleeding into ileostomy. She denies rectal bleeding but every now and then she surpasses scant amount of stool per rectum.  Current Medications: Current Outpatient Prescriptions  Medication Sig Dispense Refill  . ALPRAZolam (XANAX) 0.5 MG tablet Take 0.5 mg by mouth at bedtime.       Marland Kitchen amitriptyline (ELAVIL) 10 MG tablet Take 10 mg by mouth 2 (two) times daily.       Marland Kitchen aspirin EC 325 MG tablet Take 325 mg by mouth daily.      . carvedilol (COREG) 25 MG tablet Take 0.5 tablets (12.5 mg total) by mouth 2 (two) times daily with a meal.  30 tablet  6  . digoxin (LANOXIN) 0.125 MG tablet Take 1 tablet (0.125 mg total) by mouth daily.  30 tablet  5  . fish oil-omega-3 fatty acids 1000 MG capsule Take 2 g by mouth daily.      Marland Kitchen HYDROcodone-acetaminophen (VICODIN) 5-500 MG per tablet Take 1 tablet by mouth every 6 (six) hours as needed. For pain      . levothyroxine (SYNTHROID, LEVOTHROID) 75 MCG tablet Take 75  mcg by mouth daily.       Marland Kitchen losartan-hydrochlorothiazide (HYZAAR) 100-12.5 MG per tablet Take 1 tablet by mouth daily.  90 tablet  3  . Multiple Vitamins-Minerals (EQL GUMMY ADULT) CHEW Chew 2 tablets by mouth daily.       . nitroGLYCERIN (NITROSTAT) 0.4 MG SL tablet Place 0.4 mg under the tongue every 5 (five) minutes as needed. For chest pain      . pantoprazole (PROTONIX) 40 MG tablet Take 40 mg by mouth 2 (two) times daily.       . potassium chloride 20 MEQ/15ML (10%) solution Take 30 mLs (40 mEq total) by mouth daily.  210 mL  0   No current facility-administered medications for this visit.     Objective: Blood pressure 130/70, pulse 72, temperature 98 F (36.7 C), temperature source Oral, resp. rate 18, height 5' (1.524 m), weight 148 lb 14.4 oz (67.541 kg). Patient is alert and in no acute distress. Conjunctiva is pink. Sclera is nonicteric Oropharyngeal mucosa is normal. No neck masses or thyromegaly noted. Cardiac exam with regular rhythm normal S1 and S2. No murmur or gallop noted. Lungs are clear to auscultation. Abdomen his fold. Ileostomy located right lower quadrant. Back has greenish stool. Abdomen is soft and nontender without organomegaly or masses.  No LE edema or clubbing noted.  Lab data;  Chest and abdominopelvic CT from 01/04/2013 reviewed and  compared with prior study of 07/06/2012. No defects noted in sigmoid colon but current study was done without rectal contrast. CEA on 01/05/2013 was 1.5  Assessment:  #1. Dysphagia. Patient has history of achalasia. Last session of Botox therapy was in January 2013 and provided significant relief just like first session about 5 years ago at [facility]. She she may also develop esophagitis secondary to pill making her symptoms worse. She will be further evaluated with barium study prior to endoscopic therapy. #2. Filling defects in sigmoid colon most likely secondary to adherent stool. Sequential studies have failed to show  enlarging lesions. She has very tortuous narrow sigmoid colon not examined able to endoscopic evaluation.    Plan:  Barium Pill esophagogram. Further recommendations to follow.

## 2013-01-24 NOTE — Patient Instructions (Signed)
Physician will contact you with the results of barium study and further recommendations.

## 2013-01-25 ENCOUNTER — Other Ambulatory Visit (INDEPENDENT_AMBULATORY_CARE_PROVIDER_SITE_OTHER): Payer: Self-pay | Admitting: Internal Medicine

## 2013-01-25 DIAGNOSIS — I209 Angina pectoris, unspecified: Secondary | ICD-10-CM | POA: Diagnosis not present

## 2013-01-25 DIAGNOSIS — M545 Low back pain: Secondary | ICD-10-CM | POA: Diagnosis not present

## 2013-01-25 DIAGNOSIS — F411 Generalized anxiety disorder: Secondary | ICD-10-CM | POA: Diagnosis not present

## 2013-01-25 DIAGNOSIS — E039 Hypothyroidism, unspecified: Secondary | ICD-10-CM | POA: Diagnosis not present

## 2013-01-25 DIAGNOSIS — R072 Precordial pain: Secondary | ICD-10-CM | POA: Diagnosis not present

## 2013-01-25 DIAGNOSIS — E119 Type 2 diabetes mellitus without complications: Secondary | ICD-10-CM | POA: Diagnosis not present

## 2013-01-25 DIAGNOSIS — F321 Major depressive disorder, single episode, moderate: Secondary | ICD-10-CM | POA: Diagnosis not present

## 2013-01-25 DIAGNOSIS — IMO0001 Reserved for inherently not codable concepts without codable children: Secondary | ICD-10-CM | POA: Diagnosis not present

## 2013-01-25 MED ORDER — ISOSORBIDE MONONITRATE 10 MG PO TABS: 5.0000 mg | ORAL_TABLET | Freq: Two times a day (BID) | ORAL | Status: DC

## 2013-01-31 ENCOUNTER — Encounter (INDEPENDENT_AMBULATORY_CARE_PROVIDER_SITE_OTHER): Payer: Self-pay | Admitting: *Deleted

## 2013-02-17 DIAGNOSIS — H35079 Retinal telangiectasis, unspecified eye: Secondary | ICD-10-CM | POA: Diagnosis not present

## 2013-02-17 DIAGNOSIS — H35349 Macular cyst, hole, or pseudohole, unspecified eye: Secondary | ICD-10-CM | POA: Diagnosis not present

## 2013-02-17 DIAGNOSIS — H31019 Macula scars of posterior pole (postinflammatory) (post-traumatic), unspecified eye: Secondary | ICD-10-CM | POA: Diagnosis not present

## 2013-02-17 DIAGNOSIS — D3130 Benign neoplasm of unspecified choroid: Secondary | ICD-10-CM | POA: Diagnosis not present

## 2013-03-18 DIAGNOSIS — R300 Dysuria: Secondary | ICD-10-CM | POA: Diagnosis not present

## 2013-03-21 ENCOUNTER — Other Ambulatory Visit: Payer: Self-pay | Admitting: Cardiology

## 2013-04-18 ENCOUNTER — Other Ambulatory Visit: Payer: Self-pay | Admitting: Cardiology

## 2013-04-19 ENCOUNTER — Emergency Department (HOSPITAL_COMMUNITY): Payer: Medicare Other

## 2013-04-19 ENCOUNTER — Emergency Department (HOSPITAL_COMMUNITY)
Admission: EM | Admit: 2013-04-19 | Discharge: 2013-04-19 | Disposition: A | Discharge status: Home / Self Care | Payer: Medicare Other | Attending: Emergency Medicine | Admitting: Emergency Medicine

## 2013-04-19 ENCOUNTER — Encounter (HOSPITAL_COMMUNITY): Payer: Self-pay | Admitting: Emergency Medicine

## 2013-04-19 DIAGNOSIS — Z860100 Personal history of colon polyps, unspecified: Secondary | ICD-10-CM | POA: Insufficient documentation

## 2013-04-19 DIAGNOSIS — Z8551 Personal history of malignant neoplasm of bladder: Secondary | ICD-10-CM | POA: Insufficient documentation

## 2013-04-19 DIAGNOSIS — Z79899 Other long term (current) drug therapy: Secondary | ICD-10-CM | POA: Insufficient documentation

## 2013-04-19 DIAGNOSIS — Z85038 Personal history of other malignant neoplasm of large intestine: Secondary | ICD-10-CM | POA: Diagnosis not present

## 2013-04-19 DIAGNOSIS — K219 Gastro-esophageal reflux disease without esophagitis: Secondary | ICD-10-CM | POA: Diagnosis not present

## 2013-04-19 DIAGNOSIS — Z9889 Other specified postprocedural states: Secondary | ICD-10-CM | POA: Insufficient documentation

## 2013-04-19 DIAGNOSIS — Z87891 Personal history of nicotine dependence: Secondary | ICD-10-CM | POA: Insufficient documentation

## 2013-04-19 DIAGNOSIS — I10 Essential (primary) hypertension: Secondary | ICD-10-CM | POA: Insufficient documentation

## 2013-04-19 DIAGNOSIS — Z8742 Personal history of other diseases of the female genital tract: Secondary | ICD-10-CM | POA: Diagnosis not present

## 2013-04-19 DIAGNOSIS — E079 Disorder of thyroid, unspecified: Secondary | ICD-10-CM | POA: Diagnosis not present

## 2013-04-19 DIAGNOSIS — R112 Nausea with vomiting, unspecified: Secondary | ICD-10-CM | POA: Diagnosis not present

## 2013-04-19 DIAGNOSIS — H9190 Unspecified hearing loss, unspecified ear: Secondary | ICD-10-CM | POA: Diagnosis not present

## 2013-04-19 DIAGNOSIS — I5032 Chronic diastolic (congestive) heart failure: Secondary | ICD-10-CM | POA: Diagnosis not present

## 2013-04-19 DIAGNOSIS — Z862 Personal history of diseases of the blood and blood-forming organs and certain disorders involving the immune mechanism: Secondary | ICD-10-CM | POA: Insufficient documentation

## 2013-04-19 DIAGNOSIS — Z7982 Long term (current) use of aspirin: Secondary | ICD-10-CM | POA: Diagnosis not present

## 2013-04-19 DIAGNOSIS — Z86718 Personal history of other venous thrombosis and embolism: Secondary | ICD-10-CM | POA: Diagnosis not present

## 2013-04-19 DIAGNOSIS — R1084 Generalized abdominal pain: Secondary | ICD-10-CM | POA: Diagnosis not present

## 2013-04-19 DIAGNOSIS — R1110 Vomiting, unspecified: Secondary | ICD-10-CM

## 2013-04-19 DIAGNOSIS — M129 Arthropathy, unspecified: Secondary | ICD-10-CM | POA: Insufficient documentation

## 2013-04-19 LAB — URINALYSIS, ROUTINE W REFLEX MICROSCOPIC
BILIRUBIN URINE: NEGATIVE
GLUCOSE, UA: NEGATIVE mg/dL
Hgb urine dipstick: NEGATIVE
KETONES UR: NEGATIVE mg/dL
Nitrite: NEGATIVE
PH: 5 (ref 5.0–8.0)
Protein, ur: NEGATIVE mg/dL
Specific Gravity, Urine: 1.016 (ref 1.005–1.030)
Urobilinogen, UA: 0.2 mg/dL (ref 0.0–1.0)

## 2013-04-19 LAB — CBC WITH DIFFERENTIAL/PLATELET
BASOS PCT: 0 % (ref 0–1)
Basophils Absolute: 0 10*3/uL (ref 0.0–0.1)
EOS ABS: 0.1 10*3/uL (ref 0.0–0.7)
Eosinophils Relative: 1 % (ref 0–5)
HCT: 36.5 % (ref 36.0–46.0)
Hemoglobin: 12.2 g/dL (ref 12.0–15.0)
Lymphocytes Relative: 11 % — ABNORMAL LOW (ref 12–46)
Lymphs Abs: 0.9 10*3/uL (ref 0.7–4.0)
MCH: 29.6 pg (ref 26.0–34.0)
MCHC: 33.4 g/dL (ref 30.0–36.0)
MCV: 88.6 fL (ref 78.0–100.0)
Monocytes Absolute: 0.5 10*3/uL (ref 0.1–1.0)
Monocytes Relative: 7 % (ref 3–12)
NEUTROS PCT: 81 % — AB (ref 43–77)
Neutro Abs: 6.6 10*3/uL (ref 1.7–7.7)
PLATELETS: 167 10*3/uL (ref 150–400)
RBC: 4.12 MIL/uL (ref 3.87–5.11)
RDW: 13 % (ref 11.5–15.5)
WBC: 8.1 10*3/uL (ref 4.0–10.5)

## 2013-04-19 LAB — COMPREHENSIVE METABOLIC PANEL
ALBUMIN: 3.9 g/dL (ref 3.5–5.2)
ALT: 24 U/L (ref 0–35)
AST: 27 U/L (ref 0–37)
Alkaline Phosphatase: 74 U/L (ref 39–117)
BUN: 24 mg/dL — ABNORMAL HIGH (ref 6–23)
CO2: 25 mEq/L (ref 19–32)
Calcium: 9.7 mg/dL (ref 8.4–10.5)
Chloride: 101 mEq/L (ref 96–112)
Creatinine, Ser: 0.98 mg/dL (ref 0.50–1.10)
GFR calc Af Amer: 61 mL/min — ABNORMAL LOW (ref >90)
GFR calc non Af Amer: 53 mL/min — ABNORMAL LOW (ref >90)
Glucose, Bld: 153 mg/dL — ABNORMAL HIGH (ref 70–99)
POTASSIUM: 3.5 meq/L — AB (ref 3.7–5.3)
SODIUM: 144 meq/L (ref 137–147)
TOTAL PROTEIN: 7.3 g/dL (ref 6.0–8.3)
Total Bilirubin: 0.4 mg/dL (ref 0.3–1.2)

## 2013-04-19 LAB — URINE MICROSCOPIC-ADD ON

## 2013-04-19 MED ORDER — ONDANSETRON HCL 4 MG/2ML IJ SOLN
4.0000 mg | Freq: Once | INTRAMUSCULAR | Status: AC
  Administered 2013-04-19: 4 mg via INTRAVENOUS
  Filled 2013-04-19: qty 2

## 2013-04-19 MED ORDER — ONDANSETRON HCL 8 MG PO TABS: 8.0000 mg | ORAL_TABLET | Freq: Three times a day (TID) | ORAL | Status: DC | PRN

## 2013-04-19 MED ORDER — SODIUM CHLORIDE 0.9 % IV SOLN
INTRAVENOUS | Status: DC
Start: 2013-04-19 — End: 2013-04-19

## 2013-04-19 MED ORDER — SODIUM CHLORIDE 0.9 % IV BOLUS (SEPSIS)
500.0000 mL | Freq: Once | INTRAVENOUS | Status: AC
Start: 2013-04-19 — End: 2013-04-19
  Administered 2013-04-19: 500 mL via INTRAVENOUS

## 2013-04-19 NOTE — ED Notes (Signed)
Pt c/o abd pain that started Sunday. Pt has hx of bowel obstruction in 2012 and has an ileostomy. Pt has been vomiting today and c/o constant nausea. Currently has no pain but tender to touch.

## 2013-04-19 NOTE — ED Provider Notes (Signed)
CSN: [id number]     Arrival date & time 04/19/13  [id number] History   First MD Initiated Contact with Patient 04/19/13 [phone]     Chief Complaint  Patient presents with  . Abdominal Pain     (Consider location/radiation/quality/duration/timing/severity/associated sxs/prior Treatment) Patient is a 80 y.o. female presenting with abdominal pain. The history is provided by the patient.  Abdominal Pain  She presents for evaluation of nausea, vomiting, and abdominal pain. The discomfort started yesterday with nausea and pain, it worsened during the night, vomiting. The emesis is the color of food. She is also noticed decreased ostomy output during the night, but while in the ED, she has had ostomy output at 0900 hours. Her abdominal pain is crampy and intermittent. She does not know of any known sick contacts. She denies fever or cough, chest pain, weakness, or dizziness. She has had bowel obstructions previously and feels like that has reoccurred. Her last obstruction was in 2012. She has an ileostomy. She has had colon cancer. There are no other known modifying factors.  Past Medical History  Diagnosis Date  . Chronic diastolic CHF (congestive heart failure)     a. nl EF by echo 05/2009.  Marland Kitchen DVT of deep femoral vein   . Palpitations   . History of PSVT (paroxysmal supraventricular tachycardia)   . LBBB (left bundle branch block)   . Hypertension   . Abdominal adhesions   . Ileostomy present   . Colon polyp   . Chest pain     a. 2002 Cath: nl cors;  b. 2009 aden mv: nl;  c. 05/2009 Echo: nl.  . Wears dentures   . Breast lump   . Hyperlipidemia   . Thyroid disease   . Anemia   . History of blood clots   . Hernia   . Blood transfusion   . Hearing loss   . Arthritis   . GERD (gastroesophageal reflux disease)   . Cancer     bladder  . Colon cancer     x 2. Last 2010   Past Surgical History  Procedure Laterality Date  . Cardiac catheterization  12/10/2000    THE LEFT VENTRICLE IS MILDY  DILATED. THERE IS MILD TO MODERATE DIFFUSE HYPOKINESIS WITH EF 35%  . Colon cancer surgery    . Ileostomy    . Esophagogastroduodenoscopy  02/13/2011    Procedure: ESOPHAGOGASTRODUODENOSCOPY (EGD);  Surgeon: Rogene Houston, MD;  [facility]: [facility];  Service: Endoscopy;  Laterality: N/A;  1030  . Colonoscopy  09/26/2011    Procedure: COLONOSCOPY;  Surgeon: Rogene Houston, MD;  Location: [facility];  Service: Endoscopy;  Laterality: N/A;  215  . Cataract extraction w/phaco  11/13/2011    Procedure: CATARACT EXTRACTION PHACO AND INTRAOCULAR LENS PLACEMENT (IOC);  Surgeon: Tonny Branch, MD;  Location: [facility];  Service: Ophthalmology;  Laterality: Right;  CDE 12.26  . Cataract extraction w/phaco  12/08/2011    Procedure: CATARACT EXTRACTION PHACO AND INTRAOCULAR LENS PLACEMENT (IOC);  Surgeon: Tonny Branch, MD;  Location: [facility];  Service: Ophthalmology;  Laterality: Left;  CDE 15.11   Family History  Problem Relation Age of Onset  . Stroke    . Hypertension    . Heart disease Mother   . Stroke Mother     deceased  . Cancer Mother     bladder  . Arthritis Mother   . Arthritis Father   . Heart disease Father     decesaed  . Cancer  Father     leukemia  . Stroke Sister     alive/debilitated  . Hypertension Sister   . Other Sister     paralysis  . Heart disease Brother     bypass surgery  . Cancer Brother   . Arthritis Brother   . Stroke Sister     alive/debilitated  . Diabetes Sister   . Other Brother     stomach problems  . Other Brother     bladder   History  Substance Use Topics  . Smoking status: Former Smoker    Quit date: 02/11/1963  . Smokeless tobacco: Never Used  . Alcohol Use: No   OB History   Grav Para Term Preterm Abortions TAB SAB Ect Mult Living                 Review of Systems  Gastrointestinal: Positive for abdominal pain.  All other systems reviewed and are negative.      Allergies  Bactrim; Lidocaine hcl; Lisinopril; Nitrofurantoin  monohyd macro; Ramipril; Codeine; Demerol; and Morphine and related  Home Medications   Current Outpatient Rx  Name  Route  Sig  Dispense  Refill  . ALPRAZolam (XANAX) 0.5 MG tablet   Oral   Take 0.5 mg by mouth every 6 (six) hours as needed for anxiety.          Marland Kitchen amitriptyline (ELAVIL) 10 MG tablet   Oral   Take 10 mg by mouth 2 (two) times daily.          Marland Kitchen aspirin EC 325 MG tablet   Oral   Take 325 mg by mouth daily.         . carvedilol (COREG) 25 MG tablet   Oral   Take 12.5 mg by mouth 2 (two) times daily with a meal.         . digoxin (LANOXIN) 0.125 MG tablet   Oral   Take 1 tablet (0.125 mg total) by mouth daily.   30 tablet   5   . HYDROcodone-acetaminophen (NORCO/VICODIN) 5-325 MG per tablet   Oral   Take 1 tablet by mouth every 4 (four) hours as needed for moderate pain.         Marland Kitchen levothyroxine (SYNTHROID, LEVOTHROID) 75 MCG tablet   Oral   Take 75 mcg by mouth daily.          Marland Kitchen losartan-hydrochlorothiazide (HYZAAR) 100-12.5 MG per tablet   Oral   Take 1 tablet by mouth daily.   90 tablet   3   . Multiple Vitamins-Minerals (EQL GUMMY ADULT) CHEW   Oral   Chew 2 tablets by mouth daily.          . nitroGLYCERIN (NITROSTAT) 0.4 MG SL tablet   Sublingual   Place 0.4 mg under the tongue every 5 (five) minutes as needed. For chest pain         . Omega-3 Fatty Acids (FISH OIL) 1200 MG CAPS   Oral   Take 1,200 mg by mouth daily.         . pantoprazole (PROTONIX) 40 MG tablet   Oral   Take 40 mg by mouth daily.          . ondansetron (ZOFRAN) 8 MG tablet   Oral   Take 1 tablet (8 mg total) by mouth every 8 (eight) hours as needed for nausea or vomiting.   20 tablet   0    BP 143/56  Pulse 86  Temp(Src)  98 F (36.7 C) (Oral)  Resp 22  SpO2 96% Physical Exam  Nursing note and vitals reviewed. Constitutional: She is oriented to person, place, and time. She appears well-developed.  Elderly, frail  HENT:  Head:  Normocephalic and atraumatic.  Eyes: Conjunctivae and EOM are normal. Pupils are equal, round, and reactive to light.  Neck: Normal range of motion and phonation normal. Neck supple.  Cardiovascular: Normal rate, regular rhythm and intact distal pulses.   Pulmonary/Chest: Effort normal and breath sounds normal. No respiratory distress. She exhibits no tenderness.  Abdominal: Soft. She exhibits no distension and no mass. There is tenderness (mild, diffuse). There is no rebound and no guarding.  There is green stool in the ostomy bag  Musculoskeletal: Normal range of motion.  Neurological: She is alert and oriented to person, place, and time. She exhibits normal muscle tone.  Skin: Skin is warm and dry.  Psychiatric: She has a normal mood and affect. Her behavior is normal.    ED Course  Procedures (including critical care time)  Medications  0.9 %  sodium chloride infusion (not administered)  sodium chloride 0.9 % bolus 500 mL (0 mLs Intravenous Stopped 04/19/13 1058)  ondansetron (ZOFRAN) injection 4 mg (4 mg Intravenous Given 04/19/13 0917)    Patient Vitals for the past 24 hrs:  BP Temp Temp src Pulse Resp SpO2  04/19/13 1050 143/56 mmHg - - 86 22 96 %  04/19/13 0759 159/66 mmHg 98 F (36.7 C) Oral 77 24 97 %    11:58 AM Reevaluation with update and discussion. After initial assessment and treatment, an updated evaluation reveals she feels better, "like a blockage has passed". Vaani Morren L   1:23 PM Reevaluation with update and discussion. After initial assessment and treatment, an updated evaluation reveals tolerating oral fluids and food now. Findings discussed with patient and family, all questions answered.Daleen Bo L    Labs Review Labs Reviewed  CBC WITH DIFFERENTIAL - Abnormal; Notable for the following:    Neutrophils Relative % 81 (*)    Lymphocytes Relative 11 (*)    All other components within normal limits  COMPREHENSIVE METABOLIC PANEL - Abnormal; Notable  for the following:    Potassium 3.5 (*)    Glucose, Bld 153 (*)    BUN 24 (*)    GFR calc non Af Amer 53 (*)    GFR calc Af Amer 61 (*)    All other components within normal limits  URINALYSIS, ROUTINE W REFLEX MICROSCOPIC - Abnormal; Notable for the following:    Leukocytes, UA TRACE (*)    All other components within normal limits  URINE CULTURE  URINE MICROSCOPIC-ADD ON   Imaging Review Dg Abd Acute W/chest  04/19/2013   CLINICAL DATA:  Ostomy, pain, nausea and vomiting  EXAM: ACUTE ABDOMEN SERIES (ABDOMEN 2 VIEW & CHEST 1 VIEW)  COMPARISON:  DG ESOPHAGUS dated 01/24/2013; CT ABD - PELV W/ CM dated 01/04/2013; DG CHEST 2V dated 08/27/2011  FINDINGS: Mild cardiac enlargement is stable. The vascular pattern is normal. Lungs are clear. No pleural effusions. No free air.  There are no abnormally dilated loops of bowel. There are multiple loops containing air-fluid levels, likely involving both small and large bowel.  IMPRESSION: Nonobstructive gas pattern. Air-fluid levels can be seen with enteritis or ileus.   Electronically Signed   By: Skipper Cliche M.D.   On: 04/19/2013 08:54     EKG Interpretation   Date/Time:  Tuesday April 19 2013 09:37:06 EDT Ventricular  Rate:  77 PR Interval:  240 QRS Duration: 144 QT Interval:  420 QTC Calculation: 475 R Axis:   89 Text Interpretation:  Sinus rhythm Prolonged PR interval Nonspecific  intraventricular conduction delay Anteroseptal infarct, acute since last  tracing no significant change Confirmed by Eulis Foster  MD, Calani Gick ([id number]) on  04/19/2013 9:44:31 AM      MDM   Final diagnoses:  Vomiting    Likely transient small bowel obstruction, improved spontaneously. Doubt colitis, metabolic instability, serious bacterial infection.  Nursing Notes Reviewed/ Care Coordinated Applicable Imaging Reviewed Interpretation of Laboratory Data incorporated into ED treatment  The patient appears reasonably screened and/or stabilized for discharge  and I doubt any other medical condition or other [facility] requiring further screening, evaluation, or treatment in the ED at this time prior to discharge.  Plan: Home Medications- Zofran; Home Treatments- rest, advance diet; return here if the recommended treatment, does not improve the symptoms; Recommended follow up- PCP prn    Richarda Blade, MD 04/19/13 1325

## 2013-04-19 NOTE — Discharge Instructions (Signed)

## 2013-04-20 LAB — URINE CULTURE

## 2013-04-26 DIAGNOSIS — J0100 Acute maxillary sinusitis, unspecified: Secondary | ICD-10-CM | POA: Diagnosis not present

## 2013-04-27 ENCOUNTER — Other Ambulatory Visit: Payer: Self-pay | Admitting: Cardiology

## 2013-05-20 DIAGNOSIS — I10 Essential (primary) hypertension: Secondary | ICD-10-CM | POA: Diagnosis not present

## 2013-05-20 DIAGNOSIS — E119 Type 2 diabetes mellitus without complications: Secondary | ICD-10-CM | POA: Diagnosis not present

## 2013-05-20 DIAGNOSIS — E039 Hypothyroidism, unspecified: Secondary | ICD-10-CM | POA: Diagnosis not present

## 2013-05-20 DIAGNOSIS — E7800 Pure hypercholesterolemia, unspecified: Secondary | ICD-10-CM | POA: Diagnosis not present

## 2013-05-23 ENCOUNTER — Other Ambulatory Visit: Payer: Self-pay | Admitting: Cardiology

## 2013-05-27 DIAGNOSIS — M545 Low back pain, unspecified: Secondary | ICD-10-CM | POA: Diagnosis not present

## 2013-05-27 DIAGNOSIS — L570 Actinic keratosis: Secondary | ICD-10-CM | POA: Diagnosis not present

## 2013-05-27 DIAGNOSIS — IMO0001 Reserved for inherently not codable concepts without codable children: Secondary | ICD-10-CM | POA: Diagnosis not present

## 2013-05-27 DIAGNOSIS — E039 Hypothyroidism, unspecified: Secondary | ICD-10-CM | POA: Diagnosis not present

## 2013-05-27 DIAGNOSIS — F321 Major depressive disorder, single episode, moderate: Secondary | ICD-10-CM | POA: Diagnosis not present

## 2013-05-27 DIAGNOSIS — R072 Precordial pain: Secondary | ICD-10-CM | POA: Diagnosis not present

## 2013-05-27 DIAGNOSIS — F411 Generalized anxiety disorder: Secondary | ICD-10-CM | POA: Diagnosis not present

## 2013-05-27 DIAGNOSIS — I209 Angina pectoris, unspecified: Secondary | ICD-10-CM | POA: Diagnosis not present

## 2013-06-01 ENCOUNTER — Encounter: Payer: Self-pay | Admitting: Cardiology

## 2013-06-01 ENCOUNTER — Ambulatory Visit (INDEPENDENT_AMBULATORY_CARE_PROVIDER_SITE_OTHER): Payer: Medicare Other | Admitting: Cardiology

## 2013-06-01 VITALS — BP 122/64 | HR 63 | Ht 60.0 in | Wt 144.0 lb

## 2013-06-01 DIAGNOSIS — R079 Chest pain, unspecified: Secondary | ICD-10-CM

## 2013-06-01 DIAGNOSIS — I471 Supraventricular tachycardia: Secondary | ICD-10-CM

## 2013-06-01 DIAGNOSIS — I447 Left bundle-branch block, unspecified: Secondary | ICD-10-CM

## 2013-06-01 DIAGNOSIS — I119 Hypertensive heart disease without heart failure: Secondary | ICD-10-CM

## 2013-06-01 NOTE — Assessment & Plan Note (Signed)
She has a past history of documented SVT.  She has episodes of SVT which are infrequent.  They last just a few minutes and convert without specific therapy.  They relieved her feeling weak but she does not experience any chest discomfort.

## 2013-06-01 NOTE — Patient Instructions (Signed)
Your physician recommends that you continue on your current medications as directed. Please refer to the Current Medication list given to you today.  Your physician wants you to follow-up in: 6 month ov/ekg You will receive a reminder letter in the mail two months in advance. If you don't receive a letter, please call our office to schedule the follow-up appointment.  

## 2013-06-01 NOTE — Assessment & Plan Note (Signed)
Blood pressure has been remaining stable on current therapy.  No symptoms of CHF.  She does have exertional dyspnea which is chronic and multifactorial.

## 2013-06-01 NOTE — Assessment & Plan Note (Signed)
The patient has a history of chest tightness and discomfort in her throat with exertion such as taking out the trash.  The discomfort resolves with rest.  She will sometimes take a sublingual nitroglycerin which also helps.

## 2013-06-01 NOTE — Progress Notes (Signed)
Karen [name] Date of Birth:  1929/03/12 [address] [place], [place]  [address] [phone]         Fax   [phone]  History of Present Illness: This pleasant 80 year old woman is seen for a scheduled followup office visit. She has a complex past medical history. She has a known left bundle branch block. She does not have coronary disease. She had a normal coronary arteriogram in 2002 and a normal adenosine Cardiolite stress test in 2009. She had an echocardiogram in April 2011 showing normal systolic function with diastolic dysfunction.  In April 2014 she complained of chest and jaw pain and underwent a Lexus scan Myoview stress test on 06/03/12 which was normal and showed an ejection fraction of 69% and no ischemia. He's had a past history of swallowing problems secondary to achalasia and has had successful endoscopic injections of Botox into her esophagus. She has a remote history of a severe intra-abdominal infection resulting in the need for an ileostomy.  She has a past history of colon cancer and colon polyps. She has a history of hypothyroidism.   Current Outpatient Prescriptions  Medication Sig Dispense Refill  . ALPRAZolam (XANAX) 0.5 MG tablet Take 0.5 mg by mouth every 6 (six) hours as needed for anxiety.       Marland Kitchen amitriptyline (ELAVIL) 10 MG tablet Take 10 mg by mouth 2 (two) times daily.       Marland Kitchen aspirin EC 325 MG tablet Take 325 mg by mouth daily.      . carvedilol (COREG) 25 MG tablet Take 12.5 mg by mouth 2 (two) times daily with a meal.      . digoxin (LANOXIN) 0.125 MG tablet TAKE ONE TABLET BY MOUTH ONCE DAILY  30 tablet  0  . HYDROcodone-acetaminophen (NORCO/VICODIN) 5-325 MG per tablet Take 1 tablet by mouth every 4 (four) hours as needed for moderate pain.      Marland Kitchen levothyroxine (SYNTHROID, LEVOTHROID) 75 MCG tablet Take 75 mcg by mouth daily.       Marland Kitchen losartan-hydrochlorothiazide (HYZAAR) 100-12.5 MG per tablet Take 1 tablet by mouth daily.  90 tablet   3  . Multiple Vitamins-Minerals (EQL GUMMY ADULT) CHEW Chew 2 tablets by mouth daily.       . nitroGLYCERIN (NITROSTAT) 0.4 MG SL tablet Place 0.4 mg under the tongue every 5 (five) minutes as needed. For chest pain      . Omega-3 Fatty Acids (FISH OIL) 1200 MG CAPS Take 1,200 mg by mouth daily.      . ondansetron (ZOFRAN) 8 MG tablet Take 1 tablet (8 mg total) by mouth every 8 (eight) hours as needed for nausea or vomiting.  20 tablet  0  . pantoprazole (PROTONIX) 40 MG tablet Take 40 mg by mouth daily.        No current facility-administered medications for this visit.    Allergies  Allergen Reactions  . Bactrim [Sulfamethoxazole-Trimethoprim] Shortness Of Breath and Other (See Comments)    Hurting all over  . Lidocaine Hcl Other (See Comments)    tachycardia  . Lisinopril Cough  . Nitrofurantoin Monohyd Macro Other (See Comments)    Unknown   . Ramipril [Ramipril] Cough  . Codeine Palpitations and Other (See Comments)    Heart races  . Demerol Palpitations and Other (See Comments)    tachycardia  . Morphine And Related Palpitations    Tachycardia     Patient Active Problem List   Diagnosis Date  Noted  . Parastomal hernia 10/19/2012  . Unstable angina 05/24/2012  . Colon cancer 04/29/2012  . Ileostomy status 06/04/2011  . Paroxysmal SVT (supraventricular tachycardia) 05/06/2011  . Dysphagia 12/30/2010  . Small bowel obstruction, partial 09/03/2010  . Left bundle branch block 07/24/2010  . Chest pain, exertional 07/24/2010  . Benign hypertensive heart disease without heart failure 07/24/2010  . Hypothyroid 07/24/2010    History  Smoking status  . Former Smoker  . Quit date: 02/11/1963  Smokeless tobacco  . Never Used    History  Alcohol Use No    Family History  Problem Relation Age of Onset  . Stroke    . Hypertension    . Heart disease Mother   . Stroke Mother     deceased  . Cancer Mother     bladder  . Arthritis Mother   . Arthritis Father   .  Heart disease Father     decesaed  . Cancer Father     leukemia  . Stroke Sister     alive/debilitated  . Hypertension Sister   . Other Sister     paralysis  . Heart disease Brother     bypass surgery  . Cancer Brother   . Arthritis Brother   . Stroke Sister     alive/debilitated  . Diabetes Sister   . Other Brother     stomach problems  . Other Brother     bladder    Review of Systems: Constitutional: no fever chills diaphoresis or fatigue or change in weight.  Head and neck: no hearing loss, no epistaxis, no photophobia or visual disturbance. Respiratory: No cough, shortness of breath or wheezing. Cardiovascular: No chest pain peripheral edema, palpitations. Gastrointestinal: No abdominal distention, no abdominal pain, no change in bowel habits hematochezia or melena. Genitourinary: No dysuria, no frequency, no urgency, no nocturia. Musculoskeletal:No arthralgias, no back pain, no gait disturbance or myalgias. Neurological: No dizziness, no headaches, no numbness, no seizures, no syncope, no weakness, no tremors. Hematologic: No lymphadenopathy, no easy bruising. Psychiatric: No confusion, no hallucinations, no sleep disturbance.    Physical Exam: Filed Vitals:   06/01/13 1032  BP: 122/64  Pulse: 63  the general appearance reveals a well-developed well-nourished woman in no distress.The head and neck exam reveals pupils equal and reactive.  Extraocular movements are full.  There is no scleral icterus.  The mouth and pharynx are normal.  The neck is supple.  The carotids reveal no bruits.  The jugular venous pressure is normal.  The  thyroid is not enlarged.  There is no lymphadenopathy.  The chest is clear to percussion and auscultation.  There are no rales or rhonchi.  Expansion of the chest is symmetrical.  The precordium is quiet.  The first heart sound is normal.  The second heart sound is physiologically split.  There is no murmur gallop rub or click.  There is no  abnormal lift or heave.  The abdomen is soft and nontender.ileostomy present  The bowel sounds are normal.  The liver and spleen are not enlarged.  There are no abdominal masses.  There are no abdominal bruits.  Extremities reveal good pedal pulses.  There is no phlebitis or edema.  There is no cyanosis or clubbing.  Strength is normal and symmetrical in all extremities.  There is no lateralizing weakness.  There are no sensory deficits.  The skin is warm and dry.  There is no rash.  EKG today shows normal sinus rhythm with  left bundle branch block and secondary ST-T wave changes.  Since previous tracing of 04/19/13, heart rate is slower  Assessment / Plan:  Continue same medication.  Recheck in 6 months for followup office visit and EKG. she continues to have a lot of GI issues and musculoskeletal pain.  She states that her surgeon has not recommended any further GI surgery unless it was a true emergency.

## 2013-06-02 DIAGNOSIS — J0100 Acute maxillary sinusitis, unspecified: Secondary | ICD-10-CM | POA: Diagnosis not present

## 2013-06-02 DIAGNOSIS — J069 Acute upper respiratory infection, unspecified: Secondary | ICD-10-CM | POA: Diagnosis not present

## 2013-06-27 ENCOUNTER — Other Ambulatory Visit: Payer: Self-pay | Admitting: Cardiology

## 2013-06-29 ENCOUNTER — Telehealth (INDEPENDENT_AMBULATORY_CARE_PROVIDER_SITE_OTHER): Payer: Self-pay | Admitting: *Deleted

## 2013-06-29 NOTE — Telephone Encounter (Signed)
Spoke with Karen [name] about getting her June 15 apt rescheduled to July 13. She mentioned that she was still having trouble swallowing and the medicine Dr. Laural Golden put her on didn't help. Teigan could not remember the name of the medicine.

## 2013-06-30 ENCOUNTER — Other Ambulatory Visit (INDEPENDENT_AMBULATORY_CARE_PROVIDER_SITE_OTHER): Payer: Self-pay | Admitting: Internal Medicine

## 2013-06-30 MED ORDER — ISOSORBIDE MONONITRATE 10 MG PO TABS: 10.0000 mg | ORAL_TABLET | Freq: Two times a day (BID) | ORAL | Status: DC

## 2013-06-30 NOTE — Telephone Encounter (Signed)
Patient tried Ismo but she could not tell the difference. He took 5 mg twice a day and had no side effects. Patient not interested in laparoscopic surgery or Haller's myotomy. Will try her on Ismo 10 mg by mouth twice a day and if she does not feel better will offer Botox injection therapy which she's had twice before.

## 2013-06-30 NOTE — Telephone Encounter (Signed)
I have talked with Mrs. Wieber. She states that she is having Dysphagia with both Solids/Liquids. C/O food/liquid going down to breast bone,great pain that goes through to back. Patient is taking Protonix 40 mg QD. Appointment 08/22/13.

## 2013-07-05 ENCOUNTER — Encounter (HOSPITAL_COMMUNITY): Payer: Medicare Other | Attending: Hematology and Oncology

## 2013-07-05 ENCOUNTER — Encounter (HOSPITAL_COMMUNITY): Payer: Self-pay

## 2013-07-05 VITALS — BP 101/54 | HR 79 | Temp 98.0°F | Resp 18

## 2013-07-05 DIAGNOSIS — M479 Spondylosis, unspecified: Secondary | ICD-10-CM | POA: Diagnosis not present

## 2013-07-05 DIAGNOSIS — I447 Left bundle-branch block, unspecified: Secondary | ICD-10-CM | POA: Insufficient documentation

## 2013-07-05 DIAGNOSIS — I10 Essential (primary) hypertension: Secondary | ICD-10-CM | POA: Insufficient documentation

## 2013-07-05 DIAGNOSIS — Z85038 Personal history of other malignant neoplasm of large intestine: Secondary | ICD-10-CM | POA: Diagnosis not present

## 2013-07-05 DIAGNOSIS — Z9049 Acquired absence of other specified parts of digestive tract: Secondary | ICD-10-CM | POA: Diagnosis not present

## 2013-07-05 DIAGNOSIS — Z86718 Personal history of other venous thrombosis and embolism: Secondary | ICD-10-CM | POA: Insufficient documentation

## 2013-07-05 DIAGNOSIS — N289 Disorder of kidney and ureter, unspecified: Secondary | ICD-10-CM

## 2013-07-05 DIAGNOSIS — K220 Achalasia of cardia: Secondary | ICD-10-CM | POA: Insufficient documentation

## 2013-07-05 DIAGNOSIS — I509 Heart failure, unspecified: Secondary | ICD-10-CM | POA: Insufficient documentation

## 2013-07-05 DIAGNOSIS — Z932 Ileostomy status: Secondary | ICD-10-CM | POA: Insufficient documentation

## 2013-07-05 DIAGNOSIS — E039 Hypothyroidism, unspecified: Secondary | ICD-10-CM | POA: Diagnosis not present

## 2013-07-05 DIAGNOSIS — C189 Malignant neoplasm of colon, unspecified: Secondary | ICD-10-CM

## 2013-07-05 DIAGNOSIS — Z09 Encounter for follow-up examination after completed treatment for conditions other than malignant neoplasm: Secondary | ICD-10-CM | POA: Diagnosis not present

## 2013-07-05 DIAGNOSIS — Z87891 Personal history of nicotine dependence: Secondary | ICD-10-CM | POA: Insufficient documentation

## 2013-07-05 DIAGNOSIS — K219 Gastro-esophageal reflux disease without esophagitis: Secondary | ICD-10-CM | POA: Diagnosis not present

## 2013-07-05 DIAGNOSIS — I5032 Chronic diastolic (congestive) heart failure: Secondary | ICD-10-CM | POA: Insufficient documentation

## 2013-07-05 DIAGNOSIS — C679 Malignant neoplasm of bladder, unspecified: Secondary | ICD-10-CM

## 2013-07-05 LAB — COMPREHENSIVE METABOLIC PANEL
ALT: 18 U/L (ref 0–35)
AST: 20 U/L (ref 0–37)
Albumin: 3.4 g/dL — ABNORMAL LOW (ref 3.5–5.2)
Alkaline Phosphatase: 77 U/L (ref 39–117)
BILIRUBIN TOTAL: 0.4 mg/dL (ref 0.3–1.2)
BUN: 22 mg/dL (ref 6–23)
CO2: 29 meq/L (ref 19–32)
CREATININE: 1.23 mg/dL — AB (ref 0.50–1.10)
Calcium: 9.3 mg/dL (ref 8.4–10.5)
Chloride: 102 mEq/L (ref 96–112)
GFR calc Af Amer: 46 mL/min — ABNORMAL LOW (ref >90)
GFR, EST NON AFRICAN AMERICAN: 40 mL/min — AB (ref >90)
GLUCOSE: 117 mg/dL — AB (ref 70–99)
Potassium: 4 mEq/L (ref 3.7–5.3)
Sodium: 142 mEq/L (ref 137–147)
Total Protein: 7 g/dL (ref 6.0–8.3)

## 2013-07-05 LAB — CBC WITH DIFFERENTIAL/PLATELET
BASOS ABS: 0 10*3/uL (ref 0.0–0.1)
Basophils Relative: 0 % (ref 0–1)
EOS PCT: 3 % (ref 0–5)
Eosinophils Absolute: 0.1 10*3/uL (ref 0.0–0.7)
HCT: 31.5 % — ABNORMAL LOW (ref 36.0–46.0)
Hemoglobin: 10.6 g/dL — ABNORMAL LOW (ref 12.0–15.0)
LYMPHS ABS: 1.2 10*3/uL (ref 0.7–4.0)
Lymphocytes Relative: 26 % (ref 12–46)
MCH: 30 pg (ref 26.0–34.0)
MCHC: 33.7 g/dL (ref 30.0–36.0)
MCV: 89.2 fL (ref 78.0–100.0)
Monocytes Absolute: 0.6 10*3/uL (ref 0.1–1.0)
Monocytes Relative: 12 % (ref 3–12)
Neutro Abs: 2.9 10*3/uL (ref 1.7–7.7)
Neutrophils Relative %: 59 % (ref 43–77)
Platelets: 159 10*3/uL (ref 150–400)
RBC: 3.53 MIL/uL — ABNORMAL LOW (ref 3.87–5.11)
RDW: 14.2 % (ref 11.5–15.5)
WBC: 4.8 10*3/uL (ref 4.0–10.5)

## 2013-07-05 LAB — FERRITIN: Ferritin: 46 ng/mL (ref 10–291)

## 2013-07-05 MED ORDER — METOCLOPRAMIDE HCL 5 MG PO TABS: ORAL_TABLET | ORAL | Status: DC

## 2013-07-05 NOTE — Patient Instructions (Signed)
..  [facility] Discharge Instructions  RECOMMENDATIONS MADE BY THE CONSULTANT AND ANY TEST RESULTS WILL BE SENT TO YOUR REFERRING PHYSICIAN.  EXAM FINDINGS BY THE PHYSICIAN TODAY AND SIGNS OR SYMPTOMS TO REPORT TO CLINIC OR PRIMARY PHYSICIAN:  Exam and findings as discussed by Dr. Barnet Glasgow .  MEDICATIONS PRESCRIBED:Reglan called to your drug store    INSTRUCTIONS/FOLLOW-UP:let us know in a few weeks if the abdominal distress is better with the reglan  Return in 6 months with labs Thank you for choosing [facility] to provide your oncology and hematology care.  To afford each patient quality time with our providers, please arrive at least 15 minutes before your scheduled appointment time.  With your help, our goal is to use those 15 minutes to complete the necessary work-up to ensure our physicians have the information they need to help with your evaluation and healthcare recommendations.    Effective January 1st, 2014, we ask that you re-schedule your appointment with our physicians should you arrive 10 or more minutes late for your appointment.  We strive to give you quality time with our providers, and arriving late affects you and other patients whose appointments are after yours.    Again, thank you for choosing [facility].  Our hope is that these requests will decrease the amount of time that you wait before being seen by our physicians.       _____________________________________________________________  Should you have questions after your visit to [facility], please contact our office at [phone] between the hours of 8:30 a.m. and 5:00 p.m.  Voicemails left after 4:30 p.m. will not be returned until the following business day.  For prescription refill requests, have your pharmacy contact our office with your prescription refill request.

## 2013-07-05 NOTE — Progress Notes (Signed)
[place]  OFFICE PROGRESS NOTE  [name], MD [address] [place] [address]  DIAGNOSIS: Colon cancer - Plan: CBC with Differential, Comprehensive metabolic panel, CEA, Ferritin, CBC with Differential, Comprehensive metabolic panel, CEA, Ferritin  Malignant neoplasm of bladder, part unspecified - Plan: CBC with Differential, Comprehensive metabolic panel, CEA, Ferritin, CBC with Differential, Comprehensive metabolic panel, CEA, Ferritin  Chief Complaint  Patient presents with  . Follow-up  . Colon Cancer    CURRENT THERAPY: Watchful expectation.  INTERVAL HISTORY: Karen [name] 80 y.o. female returns for followup of multiple colon cancers, status post resection with ultimate ileostomy but never having received any chemotherapy. Virtual colonoscopy done in April 2014 showed a possible abnormality so CT scan was done 01/04/2013 which showed evidence of the ileostomy plus a left lower pole kidney cyst is slightly enlarged compared to the previous but without evidence of metastatic disease or evidence of a new primary.  In March of 2015 she was seen in the emergency room with a partial small bowel obstruction that relieved itself. She has intermittent bloating almost on a daily basis. Ileostomy is functioning well. She did find a tick a right axilla  which was not completely removed. She denies any fever, night sweats, lower extremity swelling or redness present a sore throat over the last 2 days. She denies any earache, fever, expectoration, skin rash, headache, or seizures.  MEDICAL HISTORY: Past Medical History  Diagnosis Date  . Chronic diastolic CHF (congestive heart failure)     a. nl EF by echo 05/2009.  Marland Kitchen DVT of deep femoral vein   . Palpitations   . History of PSVT (paroxysmal supraventricular tachycardia)   . LBBB (left bundle branch block)   . Hypertension   . Abdominal adhesions   . Ileostomy present   . Colon polyp     . Chest pain     a. 2002 Cath: nl cors;  b. 2009 aden mv: nl;  c. 05/2009 Echo: nl.  . Wears dentures   . Breast lump   . Hyperlipidemia   . Thyroid disease   . Anemia   . History of blood clots   . Hernia   . Blood transfusion   . Hearing loss   . Arthritis   . GERD (gastroesophageal reflux disease)   . Cancer     bladder  . Colon cancer     x 2. Last 2010    INTERIM HISTORY: has Left bundle branch block; Chest pain, exertional; Benign hypertensive heart disease without heart failure; Hypothyroid; Small bowel obstruction, partial; Dysphagia; Paroxysmal SVT (supraventricular tachycardia); Ileostomy status; Colon cancer; Unstable angina; and Parastomal hernia on her problem list.   First colon cancer in 1989 and was treated with surgery at [facility] . She states that subsequently she was diagnosed with bladder cancer 08/2008 also treated at [facility]. Then in July of 2010 she was diagnosed with a second colon cancer and was hospitalized at [facility] But later sent to [facility] where she underwent surgery x3 and now has an ileostomy.  She reports that she was never treated with adjuvant chemotherapy in of the cancers.  ALLERGIES:  is allergic to bactrim; lidocaine hcl; lisinopril; nitrofurantoin monohyd macro; ramipril; codeine; demerol; and morphine and related.  MEDICATIONS: has a current medication list which includes the following prescription(s): alprazolam, amitriptyline, aspirin ec, carvedilol, digoxin, hydrocodone-acetaminophen, isosorbide mononitrate, levothyroxine, losartan-hydrochlorothiazide, eql gummy adult, fish oil, pantoprazole, metoclopramide,  nitroglycerin, and ondansetron.  SURGICAL HISTORY:  Past Surgical History  Procedure Laterality Date  . Cardiac catheterization  12/10/2000    THE LEFT VENTRICLE IS MILDY DILATED. THERE IS MILD TO MODERATE DIFFUSE HYPOKINESIS WITH EF 35%  . Colon cancer surgery    . Ileostomy    . Esophagogastroduodenoscopy   02/13/2011    Procedure: ESOPHAGOGASTRODUODENOSCOPY (EGD);  Surgeon: Rogene Houston, MD;  Location: [facility];  Service: Endoscopy;  Laterality: N/A;  1030  . Colonoscopy  09/26/2011    Procedure: COLONOSCOPY;  Surgeon: Rogene Houston, MD;  Location: [facility];  Service: Endoscopy;  Laterality: N/A;  215  . Cataract extraction w/phaco  11/13/2011    Procedure: CATARACT EXTRACTION PHACO AND INTRAOCULAR LENS PLACEMENT (IOC);  Surgeon: Tonny Branch, MD;  Location: [facility] ORS;  Service: Ophthalmology;  Laterality: Right;  CDE 12.26  . Cataract extraction w/phaco  12/08/2011    Procedure: CATARACT EXTRACTION PHACO AND INTRAOCULAR LENS PLACEMENT (IOC);  Surgeon: Tonny Branch, MD;  Location: [facility] ORS;  Service: Ophthalmology;  Laterality: Left;  CDE 15.11    FAMILY HISTORY: family history includes Arthritis in her brother, father, and mother; Cancer in her brother, father, and mother; Diabetes in her sister; Heart disease in her brother, father, and mother; Hypertension in her sister and another family member; Other in her brother, brother, and sister; Stroke in her mother, sister, sister, and another family member.  SOCIAL HISTORY:  reports that she quit smoking about 50 years ago. She has never used smokeless tobacco. She reports that she does not drink alcohol or use illicit drugs.  REVIEW OF SYSTEMS:  Other than that discussed above is noncontributory.  PHYSICAL EXAMINATION: ECOG PERFORMANCE STATUS: 1 - Symptomatic but completely ambulatory  Blood pressure 101/54, pulse 79, temperature 98 F (36.7 C), resp. rate 18.  GENERAL:alert, no distress and comfortable SKIN: skin color, texture, turgor are normal, no rashes or significant lesions. Right axillary tic was removed. EYES: PERLA; Conjunctiva are pink and non-injected, sclera clear SINUSES: No redness or tenderness over maxillary or ethmoid sinuses OROPHARYNX:no exudate, no erythema on lips, buccal mucosa, or tongue. NECK: supple, thyroid normal  size, non-tender, without nodularity. No masses CHEST: Normal AP diameter with no breast masses. LYMPH:  no palpable lymphadenopathy in the cervical, axillary or inguinal LUNGS: clear to auscultation and percussion with normal breathing effort HEART: regular rate & rhythm and no murmurs. ABDOMEN:abdomen soft, non-tender and normal bowel sounds. Ileostomy in place with normal active bowel sounds. MUSCULOSKELETAL:no cyanosis of digits and no clubbing. Range of motion normal.  NEURO: alert & oriented x 3 with fluent speech, no focal motor/sensory deficits   LABORATORY DATA: Office Visit on 07/05/2013  Component Date Value Ref Range Status  . WBC 07/05/2013 4.8  4.0 - 10.5 K/uL Final  . RBC 07/05/2013 3.53* 3.87 - 5.11 MIL/uL Final  . Hemoglobin 07/05/2013 10.6* 12.0 - 15.0 g/dL Final  . HCT 07/05/2013 31.5* 36.0 - 46.0 % Final  . MCV 07/05/2013 89.2  78.0 - 100.0 fL Final  . MCH 07/05/2013 30.0  26.0 - 34.0 pg Final  . MCHC 07/05/2013 33.7  30.0 - 36.0 g/dL Final  . RDW 07/05/2013 14.2  11.5 - 15.5 % Final  . Platelets 07/05/2013 159  150 - 400 K/uL Final  . Neutrophils Relative % 07/05/2013 59  43 - 77 % Final  . Neutro Abs 07/05/2013 2.9  1.7 - 7.7 K/uL Final  . Lymphocytes Relative 07/05/2013 26  12 - 46 %  Final  . Lymphs Abs 07/05/2013 1.2  0.7 - 4.0 K/uL Final  . Monocytes Relative 07/05/2013 12  3 - 12 % Final  . Monocytes Absolute 07/05/2013 0.6  0.1 - 1.0 K/uL Final  . Eosinophils Relative 07/05/2013 3  0 - 5 % Final  . Eosinophils Absolute 07/05/2013 0.1  0.0 - 0.7 K/uL Final  . Basophils Relative 07/05/2013 0  0 - 1 % Final  . Basophils Absolute 07/05/2013 0.0  0.0 - 0.1 K/uL Final  . Sodium 07/05/2013 142  137 - 147 mEq/L Final  . Potassium 07/05/2013 4.0  3.7 - 5.3 mEq/L Final  . Chloride 07/05/2013 102  96 - 112 mEq/L Final  . CO2 07/05/2013 29  19 - 32 mEq/L Final  . Glucose, Bld 07/05/2013 117* 70 - 99 mg/dL Final  . BUN 07/05/2013 22  6 - 23 mg/dL Final  .  Creatinine, Ser 07/05/2013 1.23* 0.50 - 1.10 mg/dL Final  . Calcium 07/05/2013 9.3  8.4 - 10.5 mg/dL Final  . Total Protein 07/05/2013 7.0  6.0 - 8.3 g/dL Final  . Albumin 07/05/2013 3.4* 3.5 - 5.2 g/dL Final  . AST 07/05/2013 20  0 - 37 U/L Final  . ALT 07/05/2013 18  0 - 35 U/L Final  . Alkaline Phosphatase 07/05/2013 77  39 - 117 U/L Final  . Total Bilirubin 07/05/2013 0.4  0.3 - 1.2 mg/dL Final  . GFR calc non Af Amer 07/05/2013 40* >90 mL/min Final  . GFR calc Af Amer 07/05/2013 46* >90 mL/min Final   Comment: (NOTE)                          The eGFR has been calculated using the CKD EPI equation.                          This calculation has not been validated in all clinical situations.                          eGFR's persistently <90 mL/min signify possible Chronic Kidney                          Disease.    PATHOLOGY: No new pathology.  Urinalysis    Component Value Date/Time   COLORURINE YELLOW 04/19/2013 1050   APPEARANCEUR CLEAR 04/19/2013 1050   LABSPEC 1.016 04/19/2013 1050   PHURINE 5.0 04/19/2013 1050   GLUCOSEU NEGATIVE 04/19/2013 1050   HGBUR NEGATIVE 04/19/2013 1050   BILIRUBINUR NEGATIVE 04/19/2013 1050   KETONESUR NEGATIVE 04/19/2013 1050   PROTEINUR NEGATIVE 04/19/2013 1050   UROBILINOGEN 0.2 04/19/2013 1050   NITRITE NEGATIVE 04/19/2013 1050   LEUKOCYTESUR TRACE* 04/19/2013 1050    RADIOGRAPHIC STUDIES: No results found.  ASSESSMENT:  #1.Multiple colon cancers she was surgery alone, now with ileostomy, no evidence of disease.  #2. Left kidney lower pole cyst.  #3. Degenerative joint disease involving the spine, symptomatic.  #4. Previous history of DVT of the left femoral vein.  #5. Left bundle branch block.  #6.hypothyroidism.  #7. Gastroesophageal reflux disease with achalasia. #8. Intermittent small bowel obstruction.    PLAN:  #1. Reglan 5 mg before breakfast and dinner. Patient was told to call in 7-10 days regarding relief of daily symptoms. #2.  Followup in 6 months with CBC, chem profile, and CEA.   All questions were  answered. The patient knows to call the clinic with any problems, questions or concerns. We can certainly see the patient much sooner if necessary.   I spent 25 minutes counseling the patient face to face. The total time spent in the appointment was 30 minutes.    Farrel Gobble, MD 07/05/2013 12:17 PM  DISCLAIMER:  This note was dictated with voice recognition software.  Similar sounding words can inadvertently be transcribed inaccurately and may not be corrected upon review.

## 2013-07-06 ENCOUNTER — Telehealth (HOSPITAL_COMMUNITY): Payer: Self-pay

## 2013-07-06 ENCOUNTER — Other Ambulatory Visit (HOSPITAL_COMMUNITY): Payer: Self-pay | Admitting: Hematology and Oncology

## 2013-07-06 DIAGNOSIS — E611 Iron deficiency: Secondary | ICD-10-CM

## 2013-07-06 DIAGNOSIS — D500 Iron deficiency anemia secondary to blood loss (chronic): Secondary | ICD-10-CM | POA: Insufficient documentation

## 2013-07-06 DIAGNOSIS — J019 Acute sinusitis, unspecified: Secondary | ICD-10-CM | POA: Diagnosis not present

## 2013-07-06 DIAGNOSIS — K909 Intestinal malabsorption, unspecified: Secondary | ICD-10-CM | POA: Insufficient documentation

## 2013-07-06 HISTORY — DX: Iron deficiency anemia secondary to blood loss (chronic): D50.0

## 2013-07-06 LAB — CEA: CEA: 1.6 ng/mL (ref 0.0–5.0)

## 2013-07-06 NOTE — Telephone Encounter (Signed)
Message copied by Horton Chin on Wed Jul 06, 2013  3:03 PM ------      Message from: [place], [place]: Wed Jul 06, 2013  6:45 AM       Have ordered Feraheme for 07/12/2013.  Please contact patient and arrange at her convenience.  Dx.Iron Malabsorption, Iron Deficiency  Thanks.  ------

## 2013-07-07 DIAGNOSIS — D649 Anemia, unspecified: Secondary | ICD-10-CM | POA: Diagnosis not present

## 2013-07-07 DIAGNOSIS — E538 Deficiency of other specified B group vitamins: Secondary | ICD-10-CM | POA: Diagnosis not present

## 2013-07-11 ENCOUNTER — Emergency Department (HOSPITAL_COMMUNITY)
Admission: EM | Admit: 2013-07-11 | Discharge: 2013-07-11 | Disposition: A | Discharge status: Home / Self Care | Payer: Medicare Other | Attending: Emergency Medicine | Admitting: Emergency Medicine

## 2013-07-11 ENCOUNTER — Encounter (HOSPITAL_COMMUNITY): Payer: Self-pay | Admitting: Emergency Medicine

## 2013-07-11 ENCOUNTER — Emergency Department (HOSPITAL_COMMUNITY): Payer: Medicare Other

## 2013-07-11 DIAGNOSIS — Z86718 Personal history of other venous thrombosis and embolism: Secondary | ICD-10-CM | POA: Diagnosis not present

## 2013-07-11 DIAGNOSIS — M129 Arthropathy, unspecified: Secondary | ICD-10-CM | POA: Insufficient documentation

## 2013-07-11 DIAGNOSIS — Z860100 Personal history of colon polyps, unspecified: Secondary | ICD-10-CM | POA: Insufficient documentation

## 2013-07-11 DIAGNOSIS — Z85038 Personal history of other malignant neoplasm of large intestine: Secondary | ICD-10-CM | POA: Diagnosis not present

## 2013-07-11 DIAGNOSIS — Z932 Ileostomy status: Secondary | ICD-10-CM | POA: Insufficient documentation

## 2013-07-11 DIAGNOSIS — K219 Gastro-esophageal reflux disease without esophagitis: Secondary | ICD-10-CM | POA: Diagnosis not present

## 2013-07-11 DIAGNOSIS — Z79899 Other long term (current) drug therapy: Secondary | ICD-10-CM | POA: Insufficient documentation

## 2013-07-11 DIAGNOSIS — Z98811 Dental restoration status: Secondary | ICD-10-CM | POA: Diagnosis not present

## 2013-07-11 DIAGNOSIS — E079 Disorder of thyroid, unspecified: Secondary | ICD-10-CM | POA: Diagnosis not present

## 2013-07-11 DIAGNOSIS — R112 Nausea with vomiting, unspecified: Secondary | ICD-10-CM | POA: Diagnosis not present

## 2013-07-11 DIAGNOSIS — K56600 Partial intestinal obstruction, unspecified as to cause: Secondary | ICD-10-CM

## 2013-07-11 DIAGNOSIS — Z8639 Personal history of other endocrine, nutritional and metabolic disease: Secondary | ICD-10-CM | POA: Insufficient documentation

## 2013-07-11 DIAGNOSIS — R1110 Vomiting, unspecified: Secondary | ICD-10-CM | POA: Diagnosis not present

## 2013-07-11 DIAGNOSIS — Z7982 Long term (current) use of aspirin: Secondary | ICD-10-CM | POA: Diagnosis not present

## 2013-07-11 DIAGNOSIS — R109 Unspecified abdominal pain: Secondary | ICD-10-CM | POA: Diagnosis not present

## 2013-07-11 DIAGNOSIS — Z862 Personal history of diseases of the blood and blood-forming organs and certain disorders involving the immune mechanism: Secondary | ICD-10-CM | POA: Insufficient documentation

## 2013-07-11 DIAGNOSIS — Z9889 Other specified postprocedural states: Secondary | ICD-10-CM | POA: Insufficient documentation

## 2013-07-11 DIAGNOSIS — I10 Essential (primary) hypertension: Secondary | ICD-10-CM | POA: Diagnosis not present

## 2013-07-11 DIAGNOSIS — H9190 Unspecified hearing loss, unspecified ear: Secondary | ICD-10-CM | POA: Diagnosis not present

## 2013-07-11 DIAGNOSIS — I5032 Chronic diastolic (congestive) heart failure: Secondary | ICD-10-CM | POA: Diagnosis not present

## 2013-07-11 DIAGNOSIS — K56609 Unspecified intestinal obstruction, unspecified as to partial versus complete obstruction: Secondary | ICD-10-CM | POA: Insufficient documentation

## 2013-07-11 DIAGNOSIS — Z8551 Personal history of malignant neoplasm of bladder: Secondary | ICD-10-CM | POA: Insufficient documentation

## 2013-07-11 DIAGNOSIS — Z87891 Personal history of nicotine dependence: Secondary | ICD-10-CM | POA: Diagnosis not present

## 2013-07-11 LAB — CBC WITH DIFFERENTIAL/PLATELET
BASOS ABS: 0 10*3/uL (ref 0.0–0.1)
BASOS PCT: 0 % (ref 0–1)
Eosinophils Absolute: 0.1 10*3/uL (ref 0.0–0.7)
Eosinophils Relative: 1 % (ref 0–5)
HCT: 37 % (ref 36.0–46.0)
Hemoglobin: 12.3 g/dL (ref 12.0–15.0)
LYMPHS PCT: 17 % (ref 12–46)
Lymphs Abs: 1.3 10*3/uL (ref 0.7–4.0)
MCH: 29.4 pg (ref 26.0–34.0)
MCHC: 33.2 g/dL (ref 30.0–36.0)
MCV: 88.3 fL (ref 78.0–100.0)
Monocytes Absolute: 0.7 10*3/uL (ref 0.1–1.0)
Monocytes Relative: 9 % (ref 3–12)
NEUTROS ABS: 5.5 10*3/uL (ref 1.7–7.7)
Neutrophils Relative %: 73 % (ref 43–77)
PLATELETS: 185 10*3/uL (ref 150–400)
RBC: 4.19 MIL/uL (ref 3.87–5.11)
RDW: 13.9 % (ref 11.5–15.5)
WBC: 7.6 10*3/uL (ref 4.0–10.5)

## 2013-07-11 LAB — COMPREHENSIVE METABOLIC PANEL
ALBUMIN: 4.1 g/dL (ref 3.5–5.2)
ALT: 26 U/L (ref 0–35)
AST: 29 U/L (ref 0–37)
Alkaline Phosphatase: 87 U/L (ref 39–117)
BUN: 20 mg/dL (ref 6–23)
CO2: 25 meq/L (ref 19–32)
CREATININE: 1.05 mg/dL (ref 0.50–1.10)
Calcium: 10.6 mg/dL — ABNORMAL HIGH (ref 8.4–10.5)
Chloride: 97 mEq/L (ref 96–112)
GFR calc Af Amer: 56 mL/min — ABNORMAL LOW (ref >90)
GFR, EST NON AFRICAN AMERICAN: 48 mL/min — AB (ref >90)
Glucose, Bld: 160 mg/dL — ABNORMAL HIGH (ref 70–99)
Potassium: 4 mEq/L (ref 3.7–5.3)
SODIUM: 140 meq/L (ref 137–147)
Total Bilirubin: 0.6 mg/dL (ref 0.3–1.2)
Total Protein: 8.3 g/dL (ref 6.0–8.3)

## 2013-07-11 LAB — URINALYSIS, ROUTINE W REFLEX MICROSCOPIC
Bilirubin Urine: NEGATIVE
GLUCOSE, UA: NEGATIVE mg/dL
HGB URINE DIPSTICK: NEGATIVE
Ketones, ur: NEGATIVE mg/dL
LEUKOCYTES UA: NEGATIVE
Nitrite: NEGATIVE
PH: 5.5 (ref 5.0–8.0)
PROTEIN: NEGATIVE mg/dL
Specific Gravity, Urine: 1.025 (ref 1.005–1.030)
Urobilinogen, UA: 0.2 mg/dL (ref 0.0–1.0)

## 2013-07-11 LAB — DIGOXIN LEVEL: DIGOXIN LVL: 0.8 ng/mL (ref 0.8–2.0)

## 2013-07-11 LAB — LIPASE, BLOOD: Lipase: 42 U/L (ref 11–59)

## 2013-07-11 MED ORDER — ONDANSETRON 4 MG PO TBDP: 4.0000 mg | ORAL_TABLET | Freq: Three times a day (TID) | ORAL | Status: DC | PRN

## 2013-07-11 MED ORDER — FENTANYL CITRATE 0.05 MG/ML IJ SOLN
50.0000 ug | Freq: Once | INTRAMUSCULAR | Status: AC
  Administered 2013-07-11: 50 ug via INTRAVENOUS
  Filled 2013-07-11: qty 2

## 2013-07-11 MED ORDER — ONDANSETRON HCL 4 MG/2ML IJ SOLN
4.0000 mg | Freq: Once | INTRAMUSCULAR | Status: AC
  Administered 2013-07-11: 4 mg via INTRAVENOUS

## 2013-07-11 MED ORDER — ONDANSETRON HCL 4 MG/2ML IJ SOLN
INTRAMUSCULAR | Status: AC
  Filled 2013-07-11: qty 2

## 2013-07-11 MED ORDER — SODIUM CHLORIDE 0.9 % IV BOLUS (SEPSIS)
500.0000 mL | Freq: Once | INTRAVENOUS | Status: AC
  Administered 2013-07-11: 500 mL via INTRAVENOUS

## 2013-07-11 MED ORDER — ONDANSETRON HCL 4 MG/2ML IJ SOLN: 4.0000 mg | Freq: Once | INTRAMUSCULAR | Status: DC

## 2013-07-11 NOTE — ED Notes (Signed)
Patient given discharge instruction, verbalized understand. IV removed, band aid applied. Patient in wheelchair out of the department by RN with family

## 2013-07-11 NOTE — ED Notes (Signed)
Pt states she emptied bag while in bathroom.

## 2013-07-11 NOTE — ED Notes (Signed)
Pt eating meal tray 

## 2013-07-11 NOTE — Discharge Instructions (Signed)
Intestinal Obstruction °An intestinal obstruction is a blockage of the intestine. It can be caused by a physical blockage or by a problem of abnormal function of the intestine.  °CAUSES  °· Adhesions from previous surgeries. °· Cancer or tumor. °· A hernia, which is a condition in which a portion of the bowel bulges out through an opening or weakness in the abdomen. This sometimes squeezes the bowel. °· A swallowed object. °· Blockage (impaction) with worms is common in third world countries. °· A twisting of the bowel or telescoping of a portion of the bowel into another portion (intussusceptions). °· Anything that stops food from going through from the stomach to the anus. °SYMPTOMS  °Symptoms of bowel obstruction may include abdominal bloating, nausea, vomiting, explosive diarrhea, or explosive stool. You may not be able to hear your normal bowel sounds (such as "growling in your stomach"). You may also stop having bowel movements or passing gas. °DIAGNOSIS  °Usually this condition is diagnosed with a history and an examination. Often, lab studies (blood work) and X-rays may be used to find the cause. °TREATMENT  °The main treatment for this condition is to rest the intestine. Often, the obstruction may relieve itself and allow the intestine to start working again. Think of the intestine like a balloon that is blown up (filled with trapped food and water that has squeezed into a hole or area that it cannot get through).  °· If the obstruction is complete, a nasogastric (NG) tube is passed through the nose and into the stomach. It is then connected to suction to keep the stomach emptied out. This also helps treat the nausea and vomiting. °· If there is an imbalance in the electrolytes, they are corrected with intravenous fluids. These fluids have the proper chemicals in them to correct the problem. °· If the reason for the blockage does not get better with conservative (nonsurgical) treatment, surgery may be  necessary. Sometimes, surgery is done immediately if your surgeon knows that the problem is not going to get better with conservative treatment. °PROGNOSIS  °Depending on what the problem is, most of these problems can be treated by your caregivers with good results. Your caregiver will discuss with you the best course of action to take. °FOLLOWING SURGERY °Seek immediate medical attention if you have: °· Increasing abdominal pain, repeated vomiting, dehydration, or fainting. °· Severe weakness, chest pain, or back pain. °· Blood in your vomit or stool. °· Tarry stool. °Document Released: 04/19/2003 Document Revised: 05/24/2012 Document Reviewed: 09/17/2007 °ExitCare® Patient Information ©2014 [employer]. ° °

## 2013-07-11 NOTE — ED Notes (Signed)
Emptied 500cc from colostomy bag, pt denies pt

## 2013-07-11 NOTE — ED Notes (Signed)
MD at the bedside  

## 2013-07-11 NOTE — ED Notes (Signed)
Patient ambulatory to restroom with steady gait, clean catch instructions given and advised pt to bring specimen back to room as well.  

## 2013-07-11 NOTE — ED Notes (Signed)
Pt bag is leaking. Pulled loose from skin, cleaned pt and changed bag, family assisted

## 2013-07-11 NOTE — ED Notes (Signed)
Abdominal pain began last night. Vomiting began this morning. Pt has colostomy and states there was no drainage from 8pm last night until this morning. Colostomy bag was full on arrival.

## 2013-07-11 NOTE — ED Notes (Signed)
Emptied 500 cc for colostomy bag

## 2013-07-11 NOTE — ED Provider Notes (Signed)
CSN: [id number]     Arrival date & time 07/11/13  0759 History   First MD Initiated Contact with Patient 07/11/13 [phone]     Chief Complaint  Patient presents with  . Abdominal Pain     (Consider location/radiation/quality/duration/timing/severity/associated sxs/prior Treatment) Patient is a 80 y.o. female presenting with abdominal pain. The history is provided by the patient.  Abdominal Pain Associated symptoms: nausea and vomiting   Associated symptoms: no chest pain, no diarrhea and no shortness of breath    patient developed nausea vomiting and abdominal pain  last night. She has ileostomy do to previous colon cancer. She states that there was no output from her ileostomy last night but did have some on the way to the ER here. No fevers. She states her abdomen was firm and swollen. She states she was told she has a hernia next to the ileostomy, but  adhe has hadsions in the past also.no fevers. No sick contacts.   Past Medical History  Diagnosis Date  . Chronic diastolic CHF (congestive heart failure)     a. nl EF by echo 05/2009.  Marland Kitchen DVT of deep femoral vein   . Palpitations   . History of PSVT (paroxysmal supraventricular tachycardia)   . LBBB (left bundle branch block)   . Hypertension   . Abdominal adhesions   . Ileostomy present   . Colon polyp   . Chest pain     a. 2002 Cath: nl cors;  b. 2009 aden mv: nl;  c. 05/2009 Echo: nl.  . Wears dentures   . Breast lump   . Hyperlipidemia   . Thyroid disease   . Anemia   . History of blood clots   . Hernia   . Blood transfusion   . Hearing loss   . Arthritis   . GERD (gastroesophageal reflux disease)   . Cancer     bladder  . Colon cancer     x 2. Last 2010   Past Surgical History  Procedure Laterality Date  . Cardiac catheterization  12/10/2000    THE LEFT VENTRICLE IS MILDY DILATED. THERE IS MILD TO MODERATE DIFFUSE HYPOKINESIS WITH EF 35%  . Colon cancer surgery    . Ileostomy    . Esophagogastroduodenoscopy   02/13/2011    Procedure: ESOPHAGOGASTRODUODENOSCOPY (EGD);  Surgeon: Rogene Houston, MD;  [facility];  Service: Endoscopy;  Laterality: N/A;  1030  . Colonoscopy  09/26/2011    Procedure: COLONOSCOPY;  Surgeon: Rogene Houston, MD;  Location: [facility];  Service: Endoscopy;  Laterality: N/A;  215  . Cataract extraction w/phaco  11/13/2011    Procedure: CATARACT EXTRACTION PHACO AND INTRAOCULAR LENS PLACEMENT (IOC);  Surgeon: Tonny Branch, MD;  Location: [facility];  Service: Ophthalmology;  Laterality: Right;  CDE 12.26  . Cataract extraction w/phaco  12/08/2011    Procedure: CATARACT EXTRACTION PHACO AND INTRAOCULAR LENS PLACEMENT (IOC);  Surgeon: Tonny Branch, MD;  Location: [facility];  Service: Ophthalmology;  Laterality: Left;  CDE 15.11   Family History  Problem Relation Age of Onset  . Stroke    . Hypertension    . Heart disease Mother   . Stroke Mother     deceased  . Cancer Mother     bladder  . Arthritis Mother   . Arthritis Father   . Heart disease Father     decesaed  . Cancer Father     leukemia  . Stroke Sister     alive/debilitated  .  Hypertension Sister   . Other Sister     paralysis  . Heart disease Brother     bypass surgery  . Cancer Brother   . Arthritis Brother   . Stroke Sister     alive/debilitated  . Diabetes Sister   . Other Brother     stomach problems  . Other Brother     bladder   History  Substance Use Topics  . Smoking status: Former Smoker    Quit date: 02/11/1963  . Smokeless tobacco: Never Used  . Alcohol Use: No   OB History   Grav Para Term Preterm Abortions TAB SAB Ect Mult Living                 Review of Systems  Constitutional: Negative for activity change and appetite change.  Eyes: Negative for pain.  Respiratory: Negative for chest tightness and shortness of breath.   Cardiovascular: Negative for chest pain and leg swelling.  Gastrointestinal: Positive for nausea, vomiting, abdominal pain and abdominal distention.  Negative for diarrhea.  Genitourinary: Negative for flank pain.  Musculoskeletal: Negative for back pain and neck stiffness.  Skin: Negative for rash.  Neurological: Negative for weakness, numbness and headaches.  Psychiatric/Behavioral: Negative for behavioral problems.      Allergies  Bactrim; Lidocaine hcl; Lisinopril; Nitrofurantoin monohyd macro; Ramipril; Codeine; Demerol; and Morphine and related  Home Medications   Prior to Admission medications   Medication Sig Start Date End Date Taking? Authorizing Provider  ALPRAZolam Duanne Moron) 0.5 MG tablet Take 0.5 mg by mouth every 6 (six) hours as needed for anxiety.    Yes Historical Provider, MD  amitriptyline (ELAVIL) 10 MG tablet Take 10 mg by mouth at bedtime.    Yes Historical Provider, MD  amoxicillin-clavulanate (AUGMENTIN) 875-125 MG per tablet Take 1 tablet by mouth 2 (two) times daily. For 10 days Started on 07/06/2013 07/06/13  Yes Historical Provider, MD  aspirin EC 325 MG tablet Take 325 mg by mouth daily.   Yes Historical Provider, MD  carvedilol (COREG) 25 MG tablet Take 12.5 mg by mouth 2 (two) times daily with a meal.   Yes Historical Provider, MD  digoxin (LANOXIN) 0.125 MG tablet Take 1 tablet (0.125 mg total) by mouth daily.   Yes Darlin Coco, MD  HYDROcodone-acetaminophen (NORCO/VICODIN) 5-325 MG per tablet Take 1 tablet by mouth every 4 (four) hours as needed for moderate pain.   Yes Historical Provider, MD  levothyroxine (SYNTHROID, LEVOTHROID) 75 MCG tablet Take 75 mcg by mouth daily.    Yes Historical Provider, MD  losartan-hydrochlorothiazide (HYZAAR) 100-12.5 MG per tablet Take 1 tablet by mouth daily. 06/01/12  Yes Darlin Coco, MD  metoCLOPramide (REGLAN) 5 MG tablet Take one tablet twice a day before breakfast and dinner 07/05/13  Yes Farrel Gobble, MD  Multiple Vitamins-Minerals (EQL GUMMY ADULT) CHEW Chew 2 tablets by mouth daily.    Yes Historical Provider, MD  Omega-3 Fatty Acids (FISH OIL) 1200 MG  CAPS Take 1,200 mg by mouth daily.   Yes Historical Provider, MD  pantoprazole (PROTONIX) 40 MG tablet Take 40 mg by mouth daily.    Yes Historical Provider, MD  isosorbide mononitrate (ISMO,MONOKET) 10 MG tablet Take 1 tablet (10 mg total) by mouth 2 (two) times daily. 06/30/13   Rogene Houston, MD  nitroGLYCERIN (NITROSTAT) 0.4 MG SL tablet Place 0.4 mg under the tongue every 5 (five) minutes as needed. For chest pain 05/06/11   Darlin Coco, MD  ondansetron (ZOFRAN-ODT) 4 MG  disintegrating tablet Take 1 tablet (4 mg total) by mouth every 8 (eight) hours as needed for nausea or vomiting. 07/11/13   Jasper Riling. Jenesa Foresta, MD   BP 156/55  Pulse 76  Temp(Src) 97.8 F (36.6 C) (Oral)  Resp 16  Ht 5' (1.524 m)  Wt 144 lb (65.318 kg)  BMI 28.12 kg/m2  SpO2 100% Physical Exam  Nursing note and vitals reviewed. Constitutional: She is oriented to person, place, and time. She appears well-developed and well-nourished.  Patient appears uncomfortable  HENT:  Head: Normocephalic and atraumatic.  Eyes: EOM are normal. Pupils are equal, round, and reactive to light.  Neck: Normal range of motion. Neck supple.  Cardiovascular: Normal rate, regular rhythm and normal heart sounds.   No murmur heard. Pulmonary/Chest: Effort normal and breath sounds normal. No respiratory distress. She has no wheezes. She has no rales.  Abdominal: Soft. She exhibits no distension. There is tenderness. There is no rebound and no guarding.  Ileostomy the right abdomen. Mild distention. Mild tenderness on the right abdomen.  Musculoskeletal: Normal range of motion.  Neurological: She is alert and oriented to person, place, and time. No cranial nerve deficit.  Skin: Skin is warm and dry.  Psychiatric: She has a normal mood and affect. Her speech is normal.    ED Course  Procedures (including critical care time) Labs Review Labs Reviewed  COMPREHENSIVE METABOLIC PANEL - Abnormal; Notable for the following:    Glucose,  Bld 160 (*)    Calcium 10.6 (*)    GFR calc non Af Amer 48 (*)    GFR calc Af Amer 56 (*)    All other components within normal limits  CBC WITH DIFFERENTIAL  LIPASE, BLOOD  URINALYSIS, ROUTINE W REFLEX MICROSCOPIC  DIGOXIN LEVEL    Imaging Review Dg Abd Acute W/chest  07/11/2013   CLINICAL DATA:  Abdominal pain and vomiting  EXAM: ACUTE ABDOMEN SERIES (ABDOMEN 2 VIEW & CHEST 1 VIEW)  COMPARISON:  April 19, 2013  FINDINGS: PA chest: There is evidence of underlying emphysematous change. There is no edema or consolidation. The heart is borderline enlarged with normal pulmonary vascularity. No adenopathy.  Supine and upright abdomen: There is moderate stool in the colon. The bowel gas pattern is unremarkable. No obstruction or free air. There are surgical clips in the upper abdomen. There are phleboliths in the pelvis. Patient is status post total hip replacement on the left.  IMPRESSION: Bowel gas pattern unremarkable. No obstruction or free air. Moderate stool in colon. Underlying emphysematous change is noted without edema or consolidation.   Electronically Signed   By: Lowella Grip M.D.   On: 07/11/2013 09:18     EKG Interpretation None      MDM   Final diagnoses:  Partial small bowel obstruction    Patient with ileostomy and nausea vomiting with some abdominal distention. On the way to the ER she began having output from her ileostomy. She's had 4 days worth output. Patient is feeling better. She is tolerated orals here. Likely was a partial obstruction that has improved itself. Patient was given good instructions in terms of followup, and will be discharged home. Signs of return of obstruction she'll return to the ER.    Jasper Riling. Alvino Chapel, MD 07/11/13 1306

## 2013-07-12 ENCOUNTER — Ambulatory Visit (HOSPITAL_COMMUNITY): Payer: Medicare Other

## 2013-07-19 DIAGNOSIS — R5381 Other malaise: Secondary | ICD-10-CM | POA: Diagnosis not present

## 2013-07-19 DIAGNOSIS — E538 Deficiency of other specified B group vitamins: Secondary | ICD-10-CM | POA: Diagnosis not present

## 2013-07-19 DIAGNOSIS — R5383 Other fatigue: Secondary | ICD-10-CM | POA: Diagnosis not present

## 2013-07-25 ENCOUNTER — Ambulatory Visit (INDEPENDENT_AMBULATORY_CARE_PROVIDER_SITE_OTHER): Payer: Medicare Other | Admitting: Internal Medicine

## 2013-08-09 DIAGNOSIS — N6019 Diffuse cystic mastopathy of unspecified breast: Secondary | ICD-10-CM | POA: Diagnosis not present

## 2013-08-09 DIAGNOSIS — N63 Unspecified lump in unspecified breast: Secondary | ICD-10-CM | POA: Diagnosis not present

## 2013-08-09 DIAGNOSIS — N6009 Solitary cyst of unspecified breast: Secondary | ICD-10-CM | POA: Diagnosis not present

## 2013-08-11 DIAGNOSIS — L820 Inflamed seborrheic keratosis: Secondary | ICD-10-CM | POA: Diagnosis not present

## 2013-08-11 DIAGNOSIS — D235 Other benign neoplasm of skin of trunk: Secondary | ICD-10-CM | POA: Diagnosis not present

## 2013-08-11 DIAGNOSIS — I781 Nevus, non-neoplastic: Secondary | ICD-10-CM | POA: Diagnosis not present

## 2013-08-11 DIAGNOSIS — D239 Other benign neoplasm of skin, unspecified: Secondary | ICD-10-CM | POA: Diagnosis not present

## 2013-08-15 DIAGNOSIS — H353190 Nonexudative age-related macular degeneration, unspecified eye, stage unspecified: Secondary | ICD-10-CM | POA: Diagnosis not present

## 2013-08-18 DIAGNOSIS — H35079 Retinal telangiectasis, unspecified eye: Secondary | ICD-10-CM | POA: Diagnosis not present

## 2013-08-18 DIAGNOSIS — H35349 Macular cyst, hole, or pseudohole, unspecified eye: Secondary | ICD-10-CM | POA: Diagnosis not present

## 2013-08-18 DIAGNOSIS — H31019 Macula scars of posterior pole (postinflammatory) (post-traumatic), unspecified eye: Secondary | ICD-10-CM | POA: Diagnosis not present

## 2013-08-18 DIAGNOSIS — D3130 Benign neoplasm of unspecified choroid: Secondary | ICD-10-CM | POA: Diagnosis not present

## 2013-08-22 ENCOUNTER — Other Ambulatory Visit (INDEPENDENT_AMBULATORY_CARE_PROVIDER_SITE_OTHER): Payer: Self-pay | Admitting: *Deleted

## 2013-08-22 ENCOUNTER — Encounter (INDEPENDENT_AMBULATORY_CARE_PROVIDER_SITE_OTHER): Payer: Self-pay | Admitting: Internal Medicine

## 2013-08-22 ENCOUNTER — Encounter (INDEPENDENT_AMBULATORY_CARE_PROVIDER_SITE_OTHER): Payer: Self-pay | Admitting: *Deleted

## 2013-08-22 ENCOUNTER — Ambulatory Visit (INDEPENDENT_AMBULATORY_CARE_PROVIDER_SITE_OTHER): Payer: Medicare Other | Admitting: Internal Medicine

## 2013-08-22 VITALS — BP 130/70 | HR 74 | Temp 98.5°F | Resp 18 | Ht 60.0 in | Wt 147.3 lb

## 2013-08-22 DIAGNOSIS — R142 Eructation: Secondary | ICD-10-CM

## 2013-08-22 DIAGNOSIS — R141 Gas pain: Secondary | ICD-10-CM | POA: Diagnosis not present

## 2013-08-22 DIAGNOSIS — Z85038 Personal history of other malignant neoplasm of large intestine: Secondary | ICD-10-CM | POA: Diagnosis not present

## 2013-08-22 DIAGNOSIS — K220 Achalasia of cardia: Secondary | ICD-10-CM

## 2013-08-22 DIAGNOSIS — R140 Abdominal distension (gaseous): Secondary | ICD-10-CM

## 2013-08-22 DIAGNOSIS — R143 Flatulence: Secondary | ICD-10-CM

## 2013-08-22 MED ORDER — ALIGN PO CAPS: 1.0000 | ORAL_CAPSULE | Freq: Every day | ORAL | Status: DC

## 2013-08-22 NOTE — Patient Instructions (Signed)
Esophagogastroduodenoscopy with Botox injection to be scheduled. Try Align or equivalent one capsule daily for 30 days; then stop if it does not help with bloating. Eat multiple small meals daily

## 2013-08-22 NOTE — OR Nursing (Signed)
Alesia Banda in pharmacy notified of needing Botox 100 IU for patient's EGD on July 23rd.

## 2013-08-22 NOTE — Progress Notes (Signed)
Presenting complaint;  Dysphagia.  Subjective:  Patient is 80 year old Caucasian female who has history of achalasia and colon carcinoma presents for scheduled visit accompanied by her husband. She was last seen in December 2014 and underwent barium study revealing diffusely impaired insufficient motility. Contrast did pass the stomach immediately and GE junction appeared to be of increased caliber compared to prior study and barium pill did not pass upper thoracic region. Patient was Ismo at 5 mg twice a day and subsequently 10 mg twice a day. She tried it for several days but did not experience improvement in dysphagia and instead headache and she finally stopped this medication. She is having dysphagia virtually every day with knifelike pain in her chest followed by regurgitation. She denies hematemesis or melena. She only has lost 1 pound since her last visit. She states she had more bowel obstruction in March and more recently last month when she was in the emergency room. Ileostomy started to work while she was in ER and she was able to go home. She complains of post prandial fullness even at small meals. She was given low-dose metoclopramide by Dr. Barnet Glasgow about 7 weeks ago she cannot tell any improvement. She continues to intermittent urge after meals. While back she took fleets enema and 3 bowel movements. When she was seen in oncology clinic on 07/05/2013 her hemoglobin was low and iron fusion was recommended she has not gone back to to get this treatment. She had followup blood work by Dr. Quintin Alto who felt that she does not have iron deficiency anemia and found her to have low B12 and now she is on B12 tablets daily. She states followup hemoglobin had improved. She is not having bleeding or melena end ileostomy. She has multiple other complaints. She has hip pain as well as pain in other joints. She feels she is in need of hip replacement. He is also seeing a retina specialist for macular  degeneration.   Current Medications: Outpatient Encounter Prescriptions as of 08/22/2013  Medication Sig  . ALPRAZolam (XANAX) 0.5 MG tablet Take 0.5 mg by mouth at bedtime.   Marland Kitchen amitriptyline (ELAVIL) 10 MG tablet Take 10 mg by mouth at bedtime.   Marland Kitchen aspirin EC 325 MG tablet Take 325 mg by mouth daily.  . carvedilol (COREG) 25 MG tablet Take 12.5 mg by mouth 2 (two) times daily with a meal.  . Cyanocobalamin (VITAMIN B 12 PO) Take 1,000 mcg by mouth daily.  . digoxin (LANOXIN) 0.125 MG tablet Take 1 tablet (0.125 mg total) by mouth daily.  Marland Kitchen HYDROcodone-acetaminophen (NORCO/VICODIN) 5-325 MG per tablet Take 1 tablet by mouth every 4 (four) hours as needed for moderate pain.  Marland Kitchen levothyroxine (SYNTHROID, LEVOTHROID) 75 MCG tablet Take 75 mcg by mouth daily.   Marland Kitchen losartan-hydrochlorothiazide (HYZAAR) 100-12.5 MG per tablet Take 1 tablet by mouth daily.  . metoCLOPramide (REGLAN) 5 MG tablet Take one tablet twice a day before breakfast and dinner  . Multiple Vitamins-Minerals (EQL GUMMY ADULT) CHEW Chew 2 tablets by mouth daily.   . nitroGLYCERIN (NITROSTAT) 0.4 MG SL tablet Place 0.4 mg under the tongue every 5 (five) minutes as needed. For chest pain  . Omega-3 Fatty Acids (FISH OIL) 1200 MG CAPS Take 1,200 mg by mouth daily.  . ondansetron (ZOFRAN-ODT) 4 MG disintegrating tablet Take 1 tablet (4 mg total) by mouth every 8 (eight) hours as needed for nausea or vomiting.  . pantoprazole (PROTONIX) 40 MG tablet Take 40 mg by mouth daily.   Marland Kitchen  isosorbide mononitrate (ISMO,MONOKET) 10 MG tablet Take 1 tablet (10 mg total) by mouth 2 (two) times daily.  . [DISCONTINUED] amoxicillin-clavulanate (AUGMENTIN) 875-125 MG per tablet Take 1 tablet by mouth 2 (two) times daily. For 10 days Started on 07/06/2013    Objective: Blood pressure 130/70, pulse 74, temperature 98.5 F (36.9 C), temperature source Oral, resp. rate 18, height 5' (1.524 m), weight 147 lb 4.8 oz (66.815 kg). Patient is alert and in no  acute distress. She is using cane to move around. Conjunctiva is pink. Sclera is nonicteric Oropharyngeal mucosa is normal. No neck masses or thyromegaly noted. Cardiac exam with regular rhythm normal S1 and S2. No murmur or gallop noted. Lungs are clear to auscultation. Abdomen is symmetric with bulge in the right flank. Ileostomy is located on the right lower quadrant and there is greenish stool in the bag. Bowel sounds are normal. Abdomen is soft and nontender without organomegaly or masses.  No LE edema or clubbing noted.  Labs/studies Results:   H&H on ER visit on 07/11/2013 was 12.3 and 37.0 CEA was 1.6 on 07/05/2013  Assessment:  #1. Dysphagia secondary to achalasia. She has received Botox therapy twice; first treatment was at [facility] over 5 years ago and last session was in January 2013. Treatment options discussed with patient and she would like to undergo Botox therapy before considering endoscopic or laparoscopic therapy. #2. History of colon carcinoma. She has a fairly long excluded segment of sigmoid and descending colon which cannot be examined even with slim scope. She has had multiple imaging studies and defects felt to be stool she is still passing stool per rectum even though her last surgery 5 years ago. #3. Postprandial bloating without relief low-dose metoclopramide. #4. History of anemia. H&H 6 weeks ago was normal when she was in emergency room and may have been mildly dehydrated. Request recent blood work from Dr. Edythe Lynn office for review.   Plan:  EGD with Botox therapy for achalasia. Discontinue metoclopramide as it is not helping. Align one capsule by mouth daily for 1 months to see if it helps with bloating. Obtain copy of recent blood work from Dr. Edythe Lynn office.

## 2013-08-29 ENCOUNTER — Encounter (HOSPITAL_COMMUNITY): Payer: Self-pay | Admitting: Pharmacy Technician

## 2013-09-01 ENCOUNTER — Other Ambulatory Visit (INDEPENDENT_AMBULATORY_CARE_PROVIDER_SITE_OTHER): Payer: Self-pay | Admitting: Internal Medicine

## 2013-09-01 ENCOUNTER — Encounter (HOSPITAL_COMMUNITY)
Admission: RE | Disposition: A | Discharge status: Home / Self Care | Payer: Self-pay | Source: Ambulatory Visit | Attending: Internal Medicine

## 2013-09-01 ENCOUNTER — Ambulatory Visit (HOSPITAL_COMMUNITY)
Admission: RE | Admit: 2013-09-01 | Discharge: 2013-09-01 | Disposition: A | Discharge status: Home / Self Care | Payer: Medicare Other | Source: Ambulatory Visit | Attending: Internal Medicine | Admitting: Internal Medicine

## 2013-09-01 DIAGNOSIS — I509 Heart failure, unspecified: Secondary | ICD-10-CM | POA: Diagnosis not present

## 2013-09-01 DIAGNOSIS — R1110 Vomiting, unspecified: Secondary | ICD-10-CM | POA: Insufficient documentation

## 2013-09-01 DIAGNOSIS — R1310 Dysphagia, unspecified: Secondary | ICD-10-CM | POA: Diagnosis not present

## 2013-09-01 DIAGNOSIS — Z79899 Other long term (current) drug therapy: Secondary | ICD-10-CM | POA: Insufficient documentation

## 2013-09-01 DIAGNOSIS — Z86718 Personal history of other venous thrombosis and embolism: Secondary | ICD-10-CM | POA: Insufficient documentation

## 2013-09-01 DIAGNOSIS — Z85038 Personal history of other malignant neoplasm of large intestine: Secondary | ICD-10-CM | POA: Diagnosis not present

## 2013-09-01 DIAGNOSIS — I10 Essential (primary) hypertension: Secondary | ICD-10-CM | POA: Diagnosis not present

## 2013-09-01 DIAGNOSIS — K220 Achalasia of cardia: Secondary | ICD-10-CM | POA: Insufficient documentation

## 2013-09-01 DIAGNOSIS — Z87891 Personal history of nicotine dependence: Secondary | ICD-10-CM | POA: Diagnosis not present

## 2013-09-01 DIAGNOSIS — I5032 Chronic diastolic (congestive) heart failure: Secondary | ICD-10-CM | POA: Insufficient documentation

## 2013-09-01 HISTORY — PX: BOTOX INJECTION: SHX5754

## 2013-09-01 HISTORY — PX: ESOPHAGOGASTRODUODENOSCOPY: SHX5428

## 2013-09-01 SURGERY — EGD (ESOPHAGOGASTRODUODENOSCOPY)
Anesthesia: Moderate Sedation

## 2013-09-01 MED ORDER — ONABOTULINUMTOXINA 100 UNITS IJ SOLR
100.0000 [IU] | Freq: Once | INTRAMUSCULAR | Status: AC
Start: 2013-09-01 — End: 2013-09-01
  Administered 2013-09-01: 100 [IU] via INTRAMUSCULAR
  Filled 2013-09-01: qty 100

## 2013-09-01 MED ORDER — STERILE WATER FOR INJECTION IJ SOLN
INTRAMUSCULAR | Status: AC
  Filled 2013-09-01: qty 10

## 2013-09-01 MED ORDER — MIDAZOLAM HCL 5 MG/5ML IJ SOLN
INTRAMUSCULAR | Status: DC | PRN
  Administered 2013-09-01 (×4): 2 mg via INTRAVENOUS

## 2013-09-01 MED ORDER — HYOSCYAMINE SULFATE 0.125 MG SL SUBL: 0.1250 mg | SUBLINGUAL_TABLET | Freq: Four times a day (QID) | SUBLINGUAL | Status: DC | PRN

## 2013-09-01 MED ORDER — MEPERIDINE HCL 50 MG/ML IJ SOLN: INTRAMUSCULAR | Status: DC | PRN

## 2013-09-01 MED ORDER — FENTANYL CITRATE 0.05 MG/ML IJ SOLN
INTRAMUSCULAR | Status: AC
  Filled 2013-09-01: qty 2

## 2013-09-01 MED ORDER — MEPERIDINE HCL 50 MG/ML IJ SOLN
INTRAMUSCULAR | Status: AC
  Filled 2013-09-01: qty 1

## 2013-09-01 MED ORDER — FENTANYL CITRATE 0.05 MG/ML IJ SOLN
INTRAMUSCULAR | Status: DC | PRN
  Administered 2013-09-01 (×2): 25 ug via INTRAVENOUS

## 2013-09-01 MED ORDER — SODIUM CHLORIDE 0.9 % IV SOLN
INTRAVENOUS | Status: DC
  Administered 2013-09-01: 08:00:00 via INTRAVENOUS

## 2013-09-01 MED ORDER — STERILE WATER FOR IRRIGATION IR SOLN
Status: DC | PRN
  Administered 2013-09-01: 08:00:00

## 2013-09-01 MED ORDER — SODIUM CHLORIDE 0.9 % IJ SOLN
INTRAMUSCULAR | Status: DC
Start: 2013-09-01 — End: 2013-09-01
  Filled 2013-09-01: qty 10

## 2013-09-01 MED ORDER — MIDAZOLAM HCL 5 MG/5ML IJ SOLN
INTRAMUSCULAR | Status: AC
  Filled 2013-09-01: qty 10

## 2013-09-01 MED ORDER — BUTAMBEN-TETRACAINE-BENZOCAINE 2-2-14 % EX AERO
INHALATION_SPRAY | CUTANEOUS | Status: DC | PRN
  Administered 2013-09-01: 2 via TOPICAL

## 2013-09-01 MED ORDER — SODIUM CHLORIDE 0.9 % IJ SOLN
INTRAMUSCULAR | Status: AC
  Filled 2013-09-01: qty 10

## 2013-09-01 NOTE — Op Note (Signed)
EGD PROCEDURE REPORT  PATIENT:  Karen [name]  MR#:  [id number] Birthdate:  [birth year], 80 y.o., female Endoscopist:  Dr. Rogene Houston, MD Referred By:  Dr. [name], MD Procedure Date: 09/01/2013  Procedure:   EGD with Botox injection for achalasia.  Indications:  Patient is an 80 year old Caucasian female who has a chalasia and is here for Botox injection therapy. She has declined to undergo Heller's myotomy or endoscopic myotomy. She has had Botox injections twice in the past.            Informed Consent:  The risks, benefits, alternatives & imponderables which include, but are not limited to, bleeding, infection, perforation, drug reaction and potential missed lesion have been reviewed.  The potential for biopsy, lesion removal, esophageal dilation, etc. have also been discussed.  Questions have been answered.  All parties agreeable.  Please see history & physical in medical record for more information.  Medications:  Fentanyl 50 mcg IV Versed 8 mg IV Cetacaine spray topically for oropharyngeal anesthesia  Description of procedure:  The endoscope was introduced through the mouth and advanced to the second portion of the duodenum without difficulty or limitations. The mucosal surfaces were surveyed very carefully during advancement of the scope and upon withdrawal.  Findings:  Esophagus:  Mucosa of the esophagus was normal. Some resistance noted as the scope was passed across the distal segment. No erosions or ulcers were noted. GEJ:  39 cm Stomach:  Stomach was empty and distended very well with insufflation. Folds in the proximal stomach are normal. Few small hyperplastic appearing polyps noted at gastric body. Antral mucosa was normal. Pyloric channel was patent. Angularis fundus and cardia were unremarkable. Duodenum:  Normal bulbar and post bulbar mucosa.  Therapeutic/Diagnostic Maneuvers Performed:   Botox was injected in 4 quadrants about 1 cm proximal to GE junction. 17  units injected at each site into muscular layer. 2 more sites were injected about 2 cm proximal to GE junction. Total of 400 units of Botox was injected.  Complications:  None  Impression: Resistance noted at lower end of the esophagus consistent with achalasia. Few small hyperplastic appearing polyps at gastric body. Botox injection as above. Total of 100 units was injected with approximately 17 units at each site(6 sites injected altogether).  Recommendations:  Standard instructions given. Patient resume aspirin at usual dose starting in a.m.   REHMAN,NAJEEB U  09/01/2013  8:59 AM  CC: Dr. [name], MD & Dr. Rayne Du ref. provider found

## 2013-09-01 NOTE — Discharge Instructions (Signed)
Resume usual medications except aspirin which can be started on 09/02/2013. Full liquids today than usual diet starting in a.m. No driving for 24 hours. Please call office with progress report in one week.  Gastrointestinal Endoscopy, Care After Refer to this sheet in the next few weeks. These instructions provide you with information on caring for yourself after your procedure. Your caregiver may also give you more specific instructions. Your treatment has been planned according to current medical practices, but problems sometimes occur. Call your caregiver if you have any problems or questions after your procedure. HOME CARE INSTRUCTIONS  If you were given medicine to help you relax (sedative), do not drive, operate machinery, or sign important documents for 24 hours.  Avoid alcohol and hot or warm beverages for the first 24 hours after the procedure.  Only take over-the-counter or prescription medicines for pain, discomfort, or fever as directed by your caregiver. You may resume taking your normal medicines unless your caregiver tells you otherwise. Ask your caregiver when you may resume taking medicines that may cause bleeding, such as aspirin, clopidogrel, or warfarin.  You may return to your normal diet and activities on the day after your procedure, or as directed by your caregiver. Walking may help to reduce any bloated feeling in your abdomen.  Drink enough fluids to keep your urine clear or pale yellow.  You may gargle with salt water if you have a sore throat. SEEK IMMEDIATE MEDICAL CARE IF:  You have severe nausea or vomiting.  You have severe abdominal pain, abdominal cramps that last longer than 6 hours, or abdominal swelling (distention).  You have severe shoulder or back pain.  You have trouble swallowing.  You have shortness of breath, your breathing is shallow, or you are breathing faster than normal.  You have a fever or a rapid heartbeat.  You vomit blood or  material that looks like coffee grounds.  You have bloody, black, or tarry stools. MAKE SURE YOU:  Understand these instructions.  Will watch your condition.  Will get help right away if you are not doing well or get worse. Document Released: 09/11/2003 Document Revised: 06/13/2013 Document Reviewed: 04/29/2011 [facility] Patient Information 2015 [place], [place]. This information is not intended to replace advice given to you by your health care provider. Make sure you discuss any questions you have with your health care provider.  Full Liquid Diet A full liquid diet may be used:   To help you transition from a clear liquid diet to a soft diet.   When your body is healing and can only tolerate foods that are easy to digest.  Before or after certain a procedure, test, or surgery (such as stomach or intestinal surgeries).   If you have trouble swallowing or chewing.  A full liquid diet includes fluids and foods that are liquid or will become liquid at room temperature. The full liquid diet gives you the proteins, fluids, salts, and minerals that you need for energy. If you continue this diet for more than 72 hours, talk to your health care provider about how many calories you need to consume. If you continue the diet for more than 5 days, talk to your health care provider about taking a multivitamin or a nutritional supplement. WHAT DO I NEED TO KNOW ABOUT A FULL LIQUID DIET?  You may have any liquid.  You may have any food that becomes a liquid at room temperature. The food is considered a liquid if it can be poured off  a spoon at room temperature.  Drink one serving of citrus or vitamin C-enriched fruit juice daily. WHAT FOODS CAN I EAT? Grains Any grain food that can be pureed in soup (such as crackers, pasta, and rice). Hot cereal (such as farina or oatmeal) that has been blended. Talk to your health care provider or dietitian about these foods. Vegetables Pulp-free tomato or  vegetable juice. Vegetables pureed in soup.  Fruits Fruit juice, including nectars and juices with pulp. Meats and Other Protein Sources Eggs in custard, eggnog mix, and eggs used in ice cream or pudding. Strained meats, like in baby food, may be allowed. Consult your health care provider.  Dairy Milk and milk-based beverages, including milk shakes and instant breakfast mixes. Smooth yogurt. Pureed cottage cheese. Avoid these foods if they are not well tolerated. Beverages All beverages, including liquid nutritional supplements. Ask your health care provider if you can have carbonated beverages. They may not be well tolerated. Condiments Iodized salt, pepper, spices, and flavorings. Cocoa powder. Vinegar, ketchup, yellow mustard, smooth sauces (such as hollandaise, cheese sauce, or white sauce), and soy sauce. Sweets and Desserts Custard, smooth pudding. Flavored gelatin. Tapioca, junket. Plain ice cream, sherbet, fruit ices. Frozen ice pops, frozen fudge pops, pudding pops, and other frozen bars with cream. Syrups, including chocolate syrup. Sugar, honey, jelly.  Fats and Oils Margarine, butter, cream, sour cream, and oils. Other Broth and cream soups. Strained, broth-based soups. The items listed above may not be a complete list of recommended foods or beverages. Contact your dietitian for more options.  WHAT FOODS CAN I NOT EAT? Grains All breads. Grains are not allowed unless they are pureed into soup. Vegetables Vegetables are not allowed unless they are juiced, or cooked and pureed into soup. Fruits Fruits are not allowed unless they are juiced. Meats and Other Protein Sources Any meat or fish. Cooked or raw eggs. Nut butters.  Dairy Cheese.  Condiments Stone ground mustards. Fats and Oils Fats that are coarse or chunky. Sweets and Desserts Ice cream or other frozen desserts that have any solids in them or on top, such as nuts, chocolate chips, and pieces of cookies. Cakes.  Cookies. Candy. Others Soups with chunks or pieces in them. The items listed above may not be a complete list of foods and beverages to avoid. Contact your dietitian for more information. Document Released: 01/27/2005 Document Revised: 02/01/2013 Document Reviewed: 12/02/2012 [facility] Patient Information 2015 [place], [place]. This information is not intended to replace advice given to you by your health care provider. Make sure you discuss any questions you have with your health care provider.

## 2013-09-01 NOTE — H&P (Signed)
Karen [name] is an 80 y.o. female.   Chief Complaint: Patient is here for EGD with Botox injection. HPI: Patient 80 year old Caucasian female who presents with progressive dysphagia and regurgitation. She has a history of achalasia and has been treated with Botox and 2 occasions. She had therapy at [facility] in 2009 and more recently at this facility in January 2013. Patient has declined to undergo other forms of therapy.  Past Medical History  Diagnosis Date  . Chronic diastolic CHF (congestive heart failure)     a. nl EF by echo 05/2009.  Marland Kitchen DVT of deep femoral vein   . Palpitations   . History of PSVT (paroxysmal supraventricular tachycardia)   . LBBB (left bundle branch block)   . Hypertension   . Abdominal adhesions   . Ileostomy present   . Colon polyp   . Chest pain     a. 2002 Cath: nl cors;  b. 2009 aden mv: nl;  c. 05/2009 Echo: nl.  . Wears dentures   . Breast lump   . Hyperlipidemia   . Thyroid disease   . Anemia   . History of blood clots   . Hernia   . Blood transfusion   . Hearing loss   . Arthritis   . GERD (gastroesophageal reflux disease)   . Cancer     bladder  . Colon cancer     x 2. Last 2010    Past Surgical History  Procedure Laterality Date  . Cardiac catheterization  12/10/2000    THE LEFT VENTRICLE IS MILDY DILATED. THERE IS MILD TO MODERATE DIFFUSE HYPOKINESIS WITH EF 35%  . Colon cancer surgery    . Ileostomy    . Esophagogastroduodenoscopy  02/13/2011    Procedure: ESOPHAGOGASTRODUODENOSCOPY (EGD);  Surgeon: Rogene Houston, MD;  Location: [facility];  Service: Endoscopy;  Laterality: N/A;  1030  . Colonoscopy  09/26/2011    Procedure: COLONOSCOPY;  Surgeon: Rogene Houston, MD;  Location: [facility];  Service: Endoscopy;  Laterality: N/A;  215  . Cataract extraction w/phaco  11/13/2011    Procedure: CATARACT EXTRACTION PHACO AND INTRAOCULAR LENS PLACEMENT (IOC);  Surgeon: Tonny Branch, MD;  Location: [facility] ORS;  Service: Ophthalmology;   Laterality: Right;  CDE 12.26  . Cataract extraction w/phaco  12/08/2011    Procedure: CATARACT EXTRACTION PHACO AND INTRAOCULAR LENS PLACEMENT (IOC);  Surgeon: Tonny Branch, MD;  Location: [facility] OR[facility];  Service: Ophthalmology;  Laterality: Left;  CDE 15.11    Family History  Problem Relation Age of Onset  . Stroke    . Hypertension    . Heart disease Mother   . Stroke Mother     deceased  . Cancer Mother     bladder  . Arthritis Mother   . Arthritis Father   . Heart disease Father     decesaed  . Cancer Father     leukemia  . Stroke Sister     alive/debilitated  . Hypertension Sister   . Other Sister     paralysis  . Heart disease Brother     bypass surgery  . Cancer Brother   . Arthritis Brother   . Stroke Sister     alive/debilitated  . Diabetes Sister   . Other Brother     stomach problems  . Other Brother     bladder   Social History:  reports that she quit smoking about 50 years ago. She has never used smokeless tobacco. She reports  that she does not drink alcohol or use illicit drugs.  Allergies:  Allergies  Allergen Reactions  . Bactrim [Sulfamethoxazole-Trimethoprim] Shortness Of Breath and Other (See Comments)    Hurting all over  . Lidocaine Hcl Other (See Comments)    tachycardia  . Lisinopril Cough  . Nitrofurantoin Monohyd Macro Other (See Comments)    Unknown   . Ramipril [Ramipril] Cough  . Codeine Palpitations and Other (See Comments)    Heart races  . Demerol Palpitations and Other (See Comments)    tachycardia  . Morphine And Related Palpitations    Tachycardia     Medications Prior to Admission  Medication Sig Dispense Refill  . ALPRAZolam (XANAX) 0.5 MG tablet Take 0.5 mg by mouth at bedtime.       Marland Kitchen amitriptyline (ELAVIL) 10 MG tablet Take 10 mg by mouth at bedtime.       Marland Kitchen aspirin EC 325 MG tablet Take 325 mg by mouth daily.      . carvedilol (COREG) 25 MG tablet Take 12.5 mg by mouth 2 (two) times daily with a meal.      .  Cyanocobalamin (VITAMIN B 12 PO) Take 1,000 mcg by mouth daily.      . digoxin (LANOXIN) 0.125 MG tablet Take 1 tablet (0.125 mg total) by mouth daily.  30 tablet  3  . HYDROcodone-acetaminophen (NORCO/VICODIN) 5-325 MG per tablet Take 1 tablet by mouth every 4 (four) hours as needed for moderate pain.      Marland Kitchen levothyroxine (SYNTHROID, LEVOTHROID) 75 MCG tablet Take 75 mcg by mouth daily.       Marland Kitchen losartan-hydrochlorothiazide (HYZAAR) 100-12.5 MG per tablet Take 1 tablet by mouth daily.  90 tablet  3  . Multiple Vitamins-Minerals (EQL GUMMY ADULT) CHEW Chew 2 tablets by mouth daily.       . nitroGLYCERIN (NITROSTAT) 0.4 MG SL tablet Place 0.4 mg under the tongue every 5 (five) minutes as needed. For chest pain      . Omega-3 Fatty Acids (FISH OIL) 1200 MG CAPS Take 1,200 mg by mouth daily.      . ondansetron (ZOFRAN-ODT) 4 MG disintegrating tablet Take 1 tablet (4 mg total) by mouth every 8 (eight) hours as needed for nausea or vomiting.  20 tablet  0  . pantoprazole (PROTONIX) 40 MG tablet Take 40 mg by mouth daily.       . bifidobacterium infantis (ALIGN) capsule Take 1 capsule by mouth daily.  14 capsule  0    No results found for this or any previous visit (from the past 48 hour(s)). No results found.  ROS  Blood pressure 147/69, pulse 73, temperature 97.6 F (36.4 C), temperature source Oral, resp. rate 18, SpO2 95.00%. Physical Exam  Constitutional: She appears well-developed and well-nourished.  HENT:  Mouth/Throat: Oropharynx is clear and moist.  Eyes: Conjunctivae are normal. No scleral icterus.  Neck: No thyromegaly present.  Cardiovascular: Normal rate, regular rhythm and normal heart sounds.   No murmur heard. Respiratory: Effort normal and breath sounds normal.  GI: Soft.  Asymmetric abdomen with bulging right flank and ileostomy in right lower quadrant  Musculoskeletal: She exhibits no edema.  Lymphadenopathy:    She has no cervical adenopathy.  Neurological: She is  alert.  Skin: Skin is warm and dry.     Assessment/Plan Dysphagia secondary to achalasia. EGD with Botox injection.  Karen [name] U 09/01/2013, 8:22 AM

## 2013-09-02 ENCOUNTER — Encounter (HOSPITAL_COMMUNITY): Payer: Self-pay | Admitting: Internal Medicine

## 2013-09-05 ENCOUNTER — Telehealth (INDEPENDENT_AMBULATORY_CARE_PROVIDER_SITE_OTHER): Payer: Self-pay | Admitting: *Deleted

## 2013-09-05 NOTE — Telephone Encounter (Signed)
Patient will need to ask her Pharmacy. I will address this with Dr.Rehman on 09/06/13.

## 2013-09-05 NOTE — Telephone Encounter (Signed)
Drena said the Levsin was going to cost her $54 and is not covered by her insurance. Would like to know if there is anything cheaper? The return phone number is [phone].

## 2013-09-06 NOTE — Telephone Encounter (Signed)
Per [facility]. They have no way of knowing what is covered until they get the prescription from the doctor. They will process it and then get a response.

## 2013-09-07 NOTE — Telephone Encounter (Signed)
Dr.Rehman- is there another medication that you would like to write for?

## 2013-09-08 NOTE — Telephone Encounter (Signed)
Patient was called and a message was left with Dr.Rehman's recommendation. Samples were pulled and place up front for the patient to pick up.

## 2013-09-08 NOTE — Telephone Encounter (Signed)
She can try [facility]. Can give samples; bid or tid.

## 2013-09-22 DIAGNOSIS — E7800 Pure hypercholesterolemia, unspecified: Secondary | ICD-10-CM | POA: Diagnosis not present

## 2013-09-22 DIAGNOSIS — E119 Type 2 diabetes mellitus without complications: Secondary | ICD-10-CM | POA: Diagnosis not present

## 2013-09-22 DIAGNOSIS — E039 Hypothyroidism, unspecified: Secondary | ICD-10-CM | POA: Diagnosis not present

## 2013-09-29 DIAGNOSIS — F411 Generalized anxiety disorder: Secondary | ICD-10-CM | POA: Diagnosis not present

## 2013-09-29 DIAGNOSIS — IMO0001 Reserved for inherently not codable concepts without codable children: Secondary | ICD-10-CM | POA: Diagnosis not present

## 2013-09-29 DIAGNOSIS — I209 Angina pectoris, unspecified: Secondary | ICD-10-CM | POA: Diagnosis not present

## 2013-09-29 DIAGNOSIS — R1311 Dysphagia, oral phase: Secondary | ICD-10-CM | POA: Diagnosis not present

## 2013-09-29 DIAGNOSIS — M545 Low back pain, unspecified: Secondary | ICD-10-CM | POA: Diagnosis not present

## 2013-09-29 DIAGNOSIS — E039 Hypothyroidism, unspecified: Secondary | ICD-10-CM | POA: Diagnosis not present

## 2013-09-29 DIAGNOSIS — E119 Type 2 diabetes mellitus without complications: Secondary | ICD-10-CM | POA: Diagnosis not present

## 2013-09-29 DIAGNOSIS — R072 Precordial pain: Secondary | ICD-10-CM | POA: Diagnosis not present

## 2013-10-10 ENCOUNTER — Other Ambulatory Visit: Payer: Self-pay | Admitting: Cardiology

## 2013-10-11 ENCOUNTER — Ambulatory Visit (INDEPENDENT_AMBULATORY_CARE_PROVIDER_SITE_OTHER): Payer: Medicare Other | Admitting: Urology

## 2013-10-11 DIAGNOSIS — C679 Malignant neoplasm of bladder, unspecified: Secondary | ICD-10-CM

## 2013-10-20 DIAGNOSIS — L723 Sebaceous cyst: Secondary | ICD-10-CM | POA: Diagnosis not present

## 2013-10-20 DIAGNOSIS — D2320 Other benign neoplasm of skin of unspecified ear and external auricular canal: Secondary | ICD-10-CM | POA: Diagnosis not present

## 2013-10-21 DIAGNOSIS — Z23 Encounter for immunization: Secondary | ICD-10-CM | POA: Diagnosis not present

## 2013-10-26 ENCOUNTER — Other Ambulatory Visit: Payer: Self-pay | Admitting: Cardiology

## 2013-11-14 ENCOUNTER — Other Ambulatory Visit: Payer: Self-pay | Admitting: Cardiology

## 2013-11-15 DIAGNOSIS — R0982 Postnasal drip: Secondary | ICD-10-CM | POA: Diagnosis not present

## 2013-11-15 DIAGNOSIS — R0981 Nasal congestion: Secondary | ICD-10-CM | POA: Diagnosis not present

## 2013-11-16 ENCOUNTER — Other Ambulatory Visit: Payer: Self-pay | Admitting: *Deleted

## 2013-11-16 MED ORDER — CARVEDILOL 25 MG PO TABS: 12.5000 mg | ORAL_TABLET | Freq: Two times a day (BID) | ORAL | Status: DC

## 2013-11-23 ENCOUNTER — Other Ambulatory Visit: Payer: Self-pay | Admitting: Cardiology

## 2013-12-02 ENCOUNTER — Ambulatory Visit (INDEPENDENT_AMBULATORY_CARE_PROVIDER_SITE_OTHER): Payer: Medicare Other | Admitting: Cardiology

## 2013-12-02 ENCOUNTER — Encounter: Payer: Self-pay | Admitting: Cardiology

## 2013-12-02 VITALS — BP 130/68 | HR 70 | Ht 60.0 in | Wt 143.0 lb

## 2013-12-02 DIAGNOSIS — I200 Unstable angina: Secondary | ICD-10-CM

## 2013-12-02 DIAGNOSIS — Z932 Ileostomy status: Secondary | ICD-10-CM | POA: Diagnosis not present

## 2013-12-02 DIAGNOSIS — C189 Malignant neoplasm of colon, unspecified: Secondary | ICD-10-CM

## 2013-12-02 DIAGNOSIS — I447 Left bundle-branch block, unspecified: Secondary | ICD-10-CM

## 2013-12-02 DIAGNOSIS — I471 Supraventricular tachycardia, unspecified: Secondary | ICD-10-CM

## 2013-12-02 DIAGNOSIS — I119 Hypertensive heart disease without heart failure: Secondary | ICD-10-CM

## 2013-12-02 MED ORDER — CARVEDILOL 12.5 MG PO TABS: 12.5000 mg | ORAL_TABLET | Freq: Two times a day (BID) | ORAL | Status: DC

## 2013-12-02 MED ORDER — DIGOXIN 125 MCG PO TABS: ORAL_TABLET | ORAL | Status: DC

## 2013-12-02 MED ORDER — CARVEDILOL 6.25 MG PO TABS: 6.2500 mg | ORAL_TABLET | Freq: Two times a day (BID) | ORAL | Status: DC

## 2013-12-02 NOTE — Progress Notes (Addendum)
Karen [name] Date of Birth:  1929-05-05 [place] [address] [place], [place]  [address] [phone]        Fax   [phone]   History of Present Illness: This pleasant 80 year old woman is seen for a scheduled followup office visit. She has a complex past medical history. She has a known left bundle branch block. She does not have coronary disease. She had a normal coronary arteriogram in 2002 and a normal adenosine Cardiolite stress test in 2009. She had an echocardiogram in April 2011 showing normal systolic function with diastolic dysfunction. In April 2014 she complained of chest and jaw pain and underwent a Lexus scan Myoview stress test on 06/03/12 which was normal and showed an ejection fraction of 69% and no ischemia. He's had a past history of swallowing problems secondary to achalasia and has had successful endoscopic injections of Botox into her esophagus. She has a remote history of a severe intra-abdominal infection resulting in the need for an ileostomy. She has a past history of colon cancer twice and a history of colon polyps. She has a history of hypothyroidism.   Current Outpatient Prescriptions  Medication Sig Dispense Refill  . ALPRAZolam (XANAX) 0.5 MG tablet Take 0.5 mg by mouth at bedtime.       Marland Kitchen amitriptyline (ELAVIL) 10 MG tablet Take 10 mg by mouth at bedtime.       Marland Kitchen aspirin EC 325 MG tablet Take 325 mg by mouth daily.      . bifidobacterium infantis (ALIGN) capsule Take 1 capsule by mouth daily.  14 capsule  0  . carvedilol (COREG) 12.5 MG tablet Take 1 tablet (12.5 mg total) by mouth 2 (two) times daily with a meal.  180 tablet  3  . Cyanocobalamin (VITAMIN B 12 PO) Take 1,000 mcg by mouth daily.      . digoxin (LANOXIN) 0.125 MG tablet TAKE ONE TABLET BY MOUTH ONCE DAILY  90 tablet  3  . HYDROcodone-acetaminophen (NORCO/VICODIN) 5-325 MG per tablet Take 1 tablet by mouth every 4 (four) hours as needed for moderate pain.      .  hyoscyamine (LEVSIN SL) 0.125 MG SL tablet Place 1 tablet (0.125 mg total) under the tongue every 6 (six) hours as needed.  60 tablet  0  . levothyroxine (SYNTHROID, LEVOTHROID) 75 MCG tablet Take 75 mcg by mouth daily.       Marland Kitchen losartan-hydrochlorothiazide (HYZAAR) 100-12.5 MG per tablet Take 1 tablet by mouth daily.  90 tablet  3  . Multiple Vitamins-Minerals (EQL GUMMY ADULT) CHEW Chew 2 tablets by mouth daily.       . nitroGLYCERIN (NITROSTAT) 0.4 MG SL tablet Place 0.4 mg under the tongue every 5 (five) minutes as needed. For chest pain      . NITROSTAT 0.4 MG SL tablet DISSOLVE ONE TABLET UNDER THE TONGUE EVERY 5 MINUTES AS NEEDED FOR CHEST PAIN.  DO NOT EXCEED A TOTAL OF 3 DOSES IN 15 MINUTES  25 tablet  0  . Omega-3 Fatty Acids (FISH OIL) 1200 MG CAPS Take 1,200 mg by mouth daily.      . ondansetron (ZOFRAN-ODT) 4 MG disintegrating tablet Take 1 tablet (4 mg total) by mouth every 8 (eight) hours as needed for nausea or vomiting.  20 tablet  0  . pantoprazole (PROTONIX) 40 MG tablet Take 40 mg by mouth daily.        No current facility-administered medications for this visit.  Allergies  Allergen Reactions  . Bactrim [Sulfamethoxazole-Trimethoprim] Shortness Of Breath and Other (See Comments)    Hurting all over  . Lidocaine Hcl Other (See Comments)    tachycardia  . Lisinopril Cough  . Nitrofurantoin Monohyd Macro Other (See Comments)    Unknown   . Ramipril [Ramipril] Cough  . Codeine Palpitations and Other (See Comments)    Heart races  . Demerol Palpitations and Other (See Comments)    tachycardia  . Morphine And Related Palpitations    Tachycardia     Patient Active Problem List   Diagnosis Date Noted  . Iron deficiency 07/06/2013  . Malabsorption of iron 07/06/2013  . Parastomal hernia 10/19/2012  . Unstable angina 05/24/2012  . Colon cancer 04/29/2012  . Ileostomy status 06/04/2011  . Paroxysmal SVT (supraventricular tachycardia) 05/06/2011  . Dysphagia  12/30/2010  . Small bowel obstruction, partial 09/03/2010  . Left bundle branch block 07/24/2010  . Chest pain, exertional 07/24/2010  . Benign hypertensive heart disease without heart failure 07/24/2010  . Hypothyroid 07/24/2010    History  Smoking status  . Former Smoker  . Quit date: 02/11/1963  Smokeless tobacco  . Never Used    History  Alcohol Use No    Family History  Problem Relation Age of Onset  . Stroke    . Hypertension    . Heart disease Mother   . Stroke Mother     deceased  . Cancer Mother     bladder  . Arthritis Mother   . Arthritis Father   . Heart disease Father     decesaed  . Cancer Father     leukemia  . Stroke Sister     alive/debilitated  . Hypertension Sister   . Other Sister     paralysis  . Heart disease Brother     bypass surgery  . Cancer Brother   . Arthritis Brother   . Stroke Sister     alive/debilitated  . Diabetes Sister   . Other Brother     stomach problems  . Other Brother     bladder    Review of Systems: Constitutional: no fever chills diaphoresis or fatigue or change in weight.  Head and neck: no hearing loss, no epistaxis, no photophobia or visual disturbance. Respiratory: No cough, shortness of breath or wheezing. Cardiovascular: No chest pain peripheral edema, palpitations. Gastrointestinal: No abdominal distention, no abdominal pain, no change in bowel habits hematochezia or melena. Genitourinary: No dysuria, no frequency, no urgency, no nocturia. Musculoskeletal:No arthralgias, no back pain, no gait disturbance or myalgias. Neurological: No dizziness, no headaches, no numbness, no seizures, no syncope, no weakness, no tremors. Hematologic: No lymphadenopathy, no easy bruising. Psychiatric: No confusion, no hallucinations, no sleep disturbance.    Physical Exam: Filed Vitals:   12/02/13 1346  BP: 130/68  Pulse: 70  The patient appears to be in no distress.  Head and neck exam reveals that the pupils  are equal and reactive.  The extraocular movements are full.  There is no scleral icterus.  Mouth and pharynx are benign.  No lymphadenopathy.  No carotid bruits.  The jugular venous pressure is normal.  Thyroid is not enlarged or tender.  Chest is clear to percussion and auscultation.  No rales or rhonchi.  Expansion of the chest is symmetrical.  Heart reveals no abnormal lift or heave.  First and second heart sounds are normal.  There is no murmur gallop rub or click.  The abdomen is soft and  nontender.  Bowel sounds are normoactive.  There is an ileostomy present. There is no hepatosplenomegaly or mass.  There are no abdominal bruits.  Extremities reveal no phlebitis or edema.  Pedal pulses are good.  There is no cyanosis or clubbing.  Neurologic exam is normal strength and no lateralizing weakness.  No sensory deficits.  Integument reveals no rash  EKG shows normal sinus rhythm with left bundle branch block it is unchanged since 06/01/13  Assessment / Plan: 1.  Chest pain history with normal coronary arteries in 2002 and normal Myoview stress test in April 2014. 2. normal systolic function with diastolic dysfunction by echocardiogram. 3. chronic left bundle branch block 4. past history of paroxysmal SVT 5. past history of colon cancer with functioning ileostomy 6.  Past history of swallowing problems secondary to achalasia treated with endoscopic injection of Botox into the esophagus. 7. hypothyroidism  Disposition: Continue current medication.  Recheck in 6 months for office visit and EKG

## 2013-12-02 NOTE — Patient Instructions (Signed)
Your physician recommends that you continue on your current medications as directed. Please refer to the Current Medication list given to you today.  Your physician wants you to follow-up in: 6 month ov/ekg You will receive a reminder letter in the mail two months in advance. If you don't receive a letter, please call our office to schedule the follow-up appointment.  

## 2013-12-02 NOTE — Assessment & Plan Note (Signed)
She has begun to have more lower abdominal discomfort.  She will be following up with her gastroenterologist

## 2013-12-02 NOTE — Assessment & Plan Note (Signed)
She continues to have very brief runs of SVT.  These last only a few seconds.

## 2013-12-02 NOTE — Assessment & Plan Note (Signed)
No symptoms of congestive heart failure.  No dizziness or syncope.

## 2013-12-15 DIAGNOSIS — H35073 Retinal telangiectasis, bilateral: Secondary | ICD-10-CM | POA: Diagnosis not present

## 2013-12-19 ENCOUNTER — Other Ambulatory Visit (HOSPITAL_COMMUNITY): Payer: Self-pay

## 2013-12-19 DIAGNOSIS — M25511 Pain in right shoulder: Secondary | ICD-10-CM | POA: Diagnosis not present

## 2013-12-19 DIAGNOSIS — M25512 Pain in left shoulder: Secondary | ICD-10-CM | POA: Diagnosis not present

## 2013-12-19 DIAGNOSIS — S60011A Contusion of right thumb without damage to nail, initial encounter: Secondary | ICD-10-CM | POA: Diagnosis not present

## 2013-12-19 DIAGNOSIS — C189 Malignant neoplasm of colon, unspecified: Secondary | ICD-10-CM

## 2013-12-30 ENCOUNTER — Encounter (HOSPITAL_COMMUNITY): Payer: Medicare Other | Attending: Hematology and Oncology

## 2013-12-30 ENCOUNTER — Encounter (HOSPITAL_COMMUNITY): Payer: Self-pay

## 2013-12-30 ENCOUNTER — Encounter (HOSPITAL_BASED_OUTPATIENT_CLINIC_OR_DEPARTMENT_OTHER): Payer: Medicare Other

## 2013-12-30 VITALS — BP 125/66 | HR 66 | Temp 98.1°F | Resp 18 | Wt 145.6 lb

## 2013-12-30 DIAGNOSIS — E039 Hypothyroidism, unspecified: Secondary | ICD-10-CM

## 2013-12-30 DIAGNOSIS — C189 Malignant neoplasm of colon, unspecified: Secondary | ICD-10-CM

## 2013-12-30 DIAGNOSIS — Z85038 Personal history of other malignant neoplasm of large intestine: Secondary | ICD-10-CM | POA: Diagnosis not present

## 2013-12-30 DIAGNOSIS — R52 Pain, unspecified: Secondary | ICD-10-CM | POA: Diagnosis not present

## 2013-12-30 DIAGNOSIS — D509 Iron deficiency anemia, unspecified: Secondary | ICD-10-CM | POA: Insufficient documentation

## 2013-12-30 LAB — CBC WITH DIFFERENTIAL/PLATELET
BASOS PCT: 0 % (ref 0–1)
Basophils Absolute: 0 10*3/uL (ref 0.0–0.1)
EOS ABS: 0.2 10*3/uL (ref 0.0–0.7)
Eosinophils Relative: 3 % (ref 0–5)
HCT: 34.7 % — ABNORMAL LOW (ref 36.0–46.0)
Hemoglobin: 11.5 g/dL — ABNORMAL LOW (ref 12.0–15.0)
Lymphocytes Relative: 27 % (ref 12–46)
Lymphs Abs: 1.4 10*3/uL (ref 0.7–4.0)
MCH: 29.1 pg (ref 26.0–34.0)
MCHC: 33.1 g/dL (ref 30.0–36.0)
MCV: 87.8 fL (ref 78.0–100.0)
Monocytes Absolute: 0.6 10*3/uL (ref 0.1–1.0)
Monocytes Relative: 11 % (ref 3–12)
Neutro Abs: 3.2 10*3/uL (ref 1.7–7.7)
Neutrophils Relative %: 59 % (ref 43–77)
Platelets: 158 10*3/uL (ref 150–400)
RBC: 3.95 MIL/uL (ref 3.87–5.11)
RDW: 14.1 % (ref 11.5–15.5)
WBC: 5.4 10*3/uL (ref 4.0–10.5)

## 2013-12-30 LAB — COMPREHENSIVE METABOLIC PANEL
ALBUMIN: 3.6 g/dL (ref 3.5–5.2)
ALT: 19 U/L (ref 0–35)
ANION GAP: 10 (ref 5–15)
AST: 23 U/L (ref 0–37)
Alkaline Phosphatase: 79 U/L (ref 39–117)
BUN: 26 mg/dL — AB (ref 6–23)
CO2: 30 mEq/L (ref 19–32)
Calcium: 9.5 mg/dL (ref 8.4–10.5)
Chloride: 103 mEq/L (ref 96–112)
Creatinine, Ser: 1.26 mg/dL — ABNORMAL HIGH (ref 0.50–1.10)
GFR calc Af Amer: 45 mL/min — ABNORMAL LOW (ref >90)
GFR calc non Af Amer: 39 mL/min — ABNORMAL LOW (ref >90)
Glucose, Bld: 102 mg/dL — ABNORMAL HIGH (ref 70–99)
POTASSIUM: 4.2 meq/L (ref 3.7–5.3)
SODIUM: 143 meq/L (ref 137–147)
TOTAL PROTEIN: 7.3 g/dL (ref 6.0–8.3)
Total Bilirubin: 0.4 mg/dL (ref 0.3–1.2)

## 2013-12-30 NOTE — Progress Notes (Signed)
Labs for cbcd,cmp,ferr,cea 

## 2013-12-30 NOTE — Patient Instructions (Signed)
[facility] Discharge Instructions  RECOMMENDATIONS MADE BY THE CONSULTANT AND ANY TEST RESULTS WILL BE SENT TO YOUR REFERRING PHYSICIAN.  EXAM FINDINGS BY THE PHYSICIAN TODAY AND SIGNS OR SYMPTOMS TO REPORT TO CLINIC OR PRIMARY PHYSICIAN: Exam and findings as discussed by Dr. Barnet Glasgow.  MD thinks you may have a fistula.  Recommends that you discuss this with Dr. Laural Golden at your next office visit. If iron infusions are needed we will let you know on Monday.  MEDICATIONS PRESCRIBED:  Take pain medication routinely Take the reglan before meals to help your digestion and relieve the bloating that you are experiencing.  INSTRUCTIONS/FOLLOW-UP: Follow-up in 6 months with labs and office visit.  Thank you for choosing [facility] to provide your oncology and hematology care.  To afford each patient quality time with our providers, please arrive at least 15 minutes before your scheduled appointment time.  With your help, our goal is to use those 15 minutes to complete the necessary work-up to ensure our physicians have the information they need to help with your evaluation and healthcare recommendations.    Effective January 1st, 2014, we ask that you re-schedule your appointment with our physicians should you arrive 10 or more minutes late for your appointment.  We strive to give you quality time with our providers, and arriving late affects you and other patients whose appointments are after yours.    Again, thank you for choosing [facility].  Our hope is that these requests will decrease the amount of time that you wait before being seen by our physicians.       _____________________________________________________________  Should you have questions after your visit to [facility], please contact our office at [phone] between the hours of 8:30 a.m. and 4:30 p.m.  Voicemails left after 4:30 p.m. will not be returned until the  following business day.  For prescription refill requests, have your pharmacy contact our office with your prescription refill request.    _______________________________________________________________  We hope that we have given you very good care.  You may receive a patient satisfaction survey in the mail, please complete it and return it as soon as possible.  We value your feedback!  _______________________________________________________________  Have you asked about our STAR program?  STAR stands for Survivorship Training and Rehabilitation, and this is a nationally recognized cancer care program that focuses on survivorship and rehabilitation.  Cancer and cancer treatments may cause problems, such as, pain, making you feel tired and keeping you from doing the things that you need or want to do. Cancer rehabilitation can help. Our goal is to reduce these troubling effects and help you have the best quality of life possible.  You may receive a survey from a nurse that asks questions about your current state of health.  Based on the survey results, all eligible patients will be referred to the [facility] program for an evaluation so we can better serve you!  A frequently asked questions sheet is available upon request.

## 2013-12-30 NOTE — Progress Notes (Signed)
[facility]  OFFICE PROGRESS NOTE  [name], MD [place] [address]  DIAGNOSIS: Colon cancer - Plan: CBC with Differential, Comprehensive metabolic panel  Iron deficiency anemia - Plan: Ferritin  Chief Complaint  Patient presents with  . Colon Cancer  . Iron deficiency    CURRENT THERAPY: Watchful expectation  INTERVAL HISTORY: Karen [name] 80 y.o. female returns for followup of multiple colon cancers, status post resection with ultimate ileostomy but never having received any chemotherapy. Virtual colonoscopy done in April 2014 showed a possible abnormality so CT scan was done 01/04/2013 which showed evidence of the ileostomy plus a left lower pole kidney cyst is slightly enlarged compared to the previous but without evidence of metastatic disease or evidence of a new primary.  In March of 2015 she was seen in the emergency room with a partial small bowel obstruction that relieved itself. She has had worsening neck, right hip, and upper back pain with true stool coming through her ileostomy and rectum. She denies any fever, night sweats, but does have abdominal bloating. She denies any headache, and does not crave ice. She has seen no blood in her ileostomy or through the true rectum.  MEDICAL HISTORY: Past Medical History  Diagnosis Date  . Chronic diastolic CHF (congestive heart failure)     a. nl EF by echo 05/2009.  Marland Kitchen DVT of deep femoral vein   . Palpitations   . History of PSVT (paroxysmal supraventricular tachycardia)   . LBBB (left bundle branch block)   . Hypertension   . Abdominal adhesions   . Ileostomy present   . Colon polyp   . Chest pain     a. 2002 Cath: nl cors;  b. 2009 aden mv: nl;  c. 05/2009 Echo: nl.  . Wears dentures   . Breast lump   . Hyperlipidemia   . Thyroid disease   . Anemia   . History of blood clots   . Hernia   . Blood transfusion   . Hearing loss   . Arthritis   . GERD  (gastroesophageal reflux disease)   . Cancer     bladder  . Colon cancer     x 2. Last 2010    INTERIM HISTORY: has Left bundle branch block; Chest pain, exertional; Benign hypertensive heart disease without heart failure; Hypothyroid; Small bowel obstruction, partial; Dysphagia; Paroxysmal SVT (supraventricular tachycardia); Ileostomy status; Colon cancer; Unstable angina; Parastomal hernia; Iron deficiency; and Malabsorption of iron on her problem list.    ALLERGIES:  is allergic to bactrim; lidocaine hcl; lisinopril; nitrofurantoin monohyd macro; ramipril; codeine; demerol; and morphine and related.  MEDICATIONS: has a current medication list which includes the following prescription(s): alprazolam, amitriptyline, aspirin ec, carvedilol, digoxin, hydrocodone-acetaminophen, levothyroxine, losartan-hydrochlorothiazide, eql gummy adult, nitrostat, fish oil, ondansetron, pantoprazole, bifidobacterium infantis, cyanocobalamin, hyoscyamine, and metoclopramide.  SURGICAL HISTORY:  Past Surgical History  Procedure Laterality Date  . Cardiac catheterization  12/10/2000    THE LEFT VENTRICLE IS MILDY DILATED. THERE IS MILD TO MODERATE DIFFUSE HYPOKINESIS WITH EF 35%  . Colon cancer surgery    . Ileostomy    . Esophagogastroduodenoscopy  02/13/2011    Procedure: ESOPHAGOGASTRODUODENOSCOPY (EGD);  Surgeon: Rogene Houston, MD;  Location: [facility];  Service: Endoscopy;  Laterality: N/A;  1030  . Colonoscopy  09/26/2011    Procedure: COLONOSCOPY;  Surgeon: Rogene Houston, MD;  Location: [facility];  Service: Endoscopy;  Laterality: N/A;  215  . Cataract extraction w/phaco  11/13/2011    Procedure: CATARACT EXTRACTION PHACO AND INTRAOCULAR LENS PLACEMENT (IOC);  Surgeon: Tonny Branch, MD;  Location: [facility] ORS;  Service: Ophthalmology;  Laterality: Right;  CDE 12.26  . Cataract extraction w/phaco  12/08/2011    Procedure: CATARACT EXTRACTION PHACO AND INTRAOCULAR LENS PLACEMENT (IOC);  Surgeon: Tonny Branch, MD;  Location: [facility] OR[facility];  Service: Ophthalmology;  Laterality: Left;  CDE 15.11  . Esophagogastroduodenoscopy N/A 09/01/2013    Procedure: ESOPHAGOGASTRODUODENOSCOPY (EGD);  Surgeon: Rogene Houston, MD;  Location: [facility];  Service: Endoscopy;  Laterality: N/A;  830  . Botox injection N/A 09/01/2013    Procedure: BOTOX INJECTION;  Surgeon: Rogene Houston, MD;  Location: [facility];  Service: Endoscopy;  Laterality: N/A;    FAMILY HISTORY: family history includes Arthritis in her brother, father, and mother; Cancer in her brother, father, and mother; Diabetes in her sister; Heart disease in her brother, father, and mother; Hypertension in her sister and another family member; Other in her brother, brother, and sister; Stroke in her mother, sister, sister, and another family member.  SOCIAL HISTORY:  reports that she quit smoking about 50 years ago. She has never used smokeless tobacco. She reports that she does not drink alcohol or use illicit drugs.  REVIEW OF SYSTEMS:  Other than that discussed above is noncontributory.  PHYSICAL EXAMINATION: ECOG PERFORMANCE STATUS: 1 - Symptomatic but completely ambulatory  Blood pressure 125/66, pulse 66, temperature 98.1 F (36.7 C), temperature source Oral, resp. rate 18, weight 145 lb 9.6 oz (66.044 kg).  GENERAL:alert, no distress and comfortable, flat affect SKIN: skin color, texture, turgor are normal, no rashes or significant lesions EYES: PERLA; Conjunctiva are pink and non-injected, sclera clear SINUSES: No redness or tenderness over maxillary or ethmoid sinuses OROPHARYNX:no exudate, no erythema on lips, buccal mucosa, or tongue. NECK: supple, thyroid normal size, non-tender, without nodularity. No masses CHEST: Normal AP diameter with no breast masses. LYMPH:  no palpable lymphadenopathy in the cervical, axillary or inguinal LUNGS: clear to auscultation and percussion with normal breathing effort HEART: regular rate & rhythm  and no murmurs. ABDOMEN:abdomen soft, non-tender and normal bowel sounds. Ileostomy in place with no rebound tenderness. No free fluid wave or shifting dullness. MUSCULOSKELETAL:no cyanosis of digits and no clubbing. Range of motion normal.  NEURO: alert & oriented x 3 with fluent speech, no focal motor/sensory deficits   LABORATORY DATA: Lab on 12/30/2013  Component Date Value Ref Range Status  . WBC 12/30/2013 5.4  4.0 - 10.5 K/uL Final  . RBC 12/30/2013 3.95  3.87 - 5.11 MIL/uL Final  . Hemoglobin 12/30/2013 11.5* 12.0 - 15.0 g/dL Final  . HCT 12/30/2013 34.7* 36.0 - 46.0 % Final  . MCV 12/30/2013 87.8  78.0 - 100.0 fL Final  . MCH 12/30/2013 29.1  26.0 - 34.0 pg Final  . MCHC 12/30/2013 33.1  30.0 - 36.0 g/dL Final  . RDW 12/30/2013 14.1  11.5 - 15.5 % Final  . Platelets 12/30/2013 158  150 - 400 K/uL Final  . Neutrophils Relative % 12/30/2013 59  43 - 77 % Final  . Neutro Abs 12/30/2013 3.2  1.7 - 7.7 K/uL Final  . Lymphocytes Relative 12/30/2013 27  12 - 46 % Final  . Lymphs Abs 12/30/2013 1.4  0.7 - 4.0 K/uL Final  . Monocytes Relative 12/30/2013 11  3 - 12 % Final  . Monocytes Absolute 12/30/2013 0.6  0.1 -  1.0 K/uL Final  . Eosinophils Relative 12/30/2013 3  0 - 5 % Final  . Eosinophils Absolute 12/30/2013 0.2  0.0 - 0.7 K/uL Final  . Basophils Relative 12/30/2013 0  0 - 1 % Final  . Basophils Absolute 12/30/2013 0.0  0.0 - 0.1 K/uL Final  . Sodium 12/30/2013 143  137 - 147 mEq/L Final  . Potassium 12/30/2013 4.2  3.7 - 5.3 mEq/L Final  . Chloride 12/30/2013 103  96 - 112 mEq/L Final  . CO2 12/30/2013 30  19 - 32 mEq/L Final  . Glucose, Bld 12/30/2013 102* 70 - 99 mg/dL Final  . BUN 12/30/2013 26* 6 - 23 mg/dL Final  . Creatinine, Ser 12/30/2013 1.26* 0.50 - 1.10 mg/dL Final  . Calcium 12/30/2013 9.5  8.4 - 10.5 mg/dL Final  . Total Protein 12/30/2013 7.3  6.0 - 8.3 g/dL Final  . Albumin 12/30/2013 3.6  3.5 - 5.2 g/dL Final  . AST 12/30/2013 23  0 - 37 U/L Final  . ALT  12/30/2013 19  0 - 35 U/L Final  . Alkaline Phosphatase 12/30/2013 79  39 - 117 U/L Final  . Total Bilirubin 12/30/2013 0.4  0.3 - 1.2 mg/dL Final  . GFR calc non Af Amer 12/30/2013 39* >90 mL/min Final  . GFR calc Af Amer 12/30/2013 45* >90 mL/min Final   Comment: (NOTE) The eGFR has been calculated using the CKD EPI equation. This calculation has not been validated in all clinical situations. eGFR's persistently <90 mL/min signify possible Chronic Kidney Disease.   . Anion gap 12/30/2013 10  5 - 15 Final    PATHOLOGY: No new pathology.  Urinalysis    Component Value Date/Time   COLORURINE YELLOW 07/11/2013 1145   APPEARANCEUR CLEAR 07/11/2013 1145   LABSPEC 1.025 07/11/2013 1145   PHURINE 5.5 07/11/2013 1145   GLUCOSEU NEGATIVE 07/11/2013 1145   HGBUR NEGATIVE 07/11/2013 1145   BILIRUBINUR NEGATIVE 07/11/2013 1145   KETONESUR NEGATIVE 07/11/2013 1145   PROTEINUR NEGATIVE 07/11/2013 1145   UROBILINOGEN 0.2 07/11/2013 1145   NITRITE NEGATIVE 07/11/2013 1145   LEUKOCYTESUR NEGATIVE 07/11/2013 1145    RADIOGRAPHIC STUDIES: No results found.  ASSESSMENT:  #1.Multiple colon cancers she was surgery alone, now with ileostomy, no evidence of disease.  #2. Left kidney lower pole cyst.  #3. Degenerative joint disease involving the spine, symptomatic.  #4. Previous history of DVT of the left femoral vein.  #5. Left bundle branch block.  #6. Hypothyroidism.  #7. Gastroesophageal reflux disease with achalasia. #8. 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  PLAN:  #1. Take hydrocodone every 6 hours by the clock in order to control pain. #2. Take Reglan 5 mg before breakfast and dinner or up to 4 times a day. #3. Follow-up as scheduled with gastroenterology in January. #4. If ferritin is low, intravenous iron will be administered on 01/03/2014 and 01/10/2014. #5. Follow-up in 6 months with CBC and ferritin.   All questions were answered. The  patient knows to call the clinic with any problems, questions or concerns. We can certainly see the patient much sooner if necessary.   I spent 30 minutes counseling the patient face to face. The total time spent in the appointment was 40 minutes.    Doroteo Bradford, MD 12/30/2013 11:57 AM  DISCLAIMER:  This note was dictated with voice recognition software.  Similar sounding words can inadvertently be transcribed inaccurately and may not be corrected upon review.

## 2013-12-31 LAB — FERRITIN: Ferritin: 36 ng/mL (ref 10–291)

## 2013-12-31 LAB — CEA: CEA: 6.1 ng/mL — ABNORMAL HIGH (ref 0.0–5.0)

## 2014-01-04 ENCOUNTER — Other Ambulatory Visit (HOSPITAL_COMMUNITY): Payer: Medicare Other

## 2014-01-04 ENCOUNTER — Ambulatory Visit (HOSPITAL_COMMUNITY): Payer: Medicare Other

## 2014-01-19 DIAGNOSIS — M25551 Pain in right hip: Secondary | ICD-10-CM | POA: Diagnosis not present

## 2014-01-19 DIAGNOSIS — M7061 Trochanteric bursitis, right hip: Secondary | ICD-10-CM | POA: Diagnosis not present

## 2014-01-24 DIAGNOSIS — I10 Essential (primary) hypertension: Secondary | ICD-10-CM | POA: Diagnosis not present

## 2014-01-24 DIAGNOSIS — E780 Pure hypercholesterolemia: Secondary | ICD-10-CM | POA: Diagnosis not present

## 2014-01-24 DIAGNOSIS — E039 Hypothyroidism, unspecified: Secondary | ICD-10-CM | POA: Diagnosis not present

## 2014-01-24 DIAGNOSIS — E119 Type 2 diabetes mellitus without complications: Secondary | ICD-10-CM | POA: Diagnosis not present

## 2014-01-30 DIAGNOSIS — M545 Low back pain: Secondary | ICD-10-CM | POA: Diagnosis not present

## 2014-01-30 DIAGNOSIS — F411 Generalized anxiety disorder: Secondary | ICD-10-CM | POA: Diagnosis not present

## 2014-01-30 DIAGNOSIS — M797 Fibromyalgia: Secondary | ICD-10-CM | POA: Diagnosis not present

## 2014-01-30 DIAGNOSIS — E039 Hypothyroidism, unspecified: Secondary | ICD-10-CM | POA: Diagnosis not present

## 2014-01-30 DIAGNOSIS — I10 Essential (primary) hypertension: Secondary | ICD-10-CM | POA: Diagnosis not present

## 2014-01-30 DIAGNOSIS — E780 Pure hypercholesterolemia: Secondary | ICD-10-CM | POA: Diagnosis not present

## 2014-01-30 DIAGNOSIS — Z1389 Encounter for screening for other disorder: Secondary | ICD-10-CM | POA: Diagnosis not present

## 2014-01-30 DIAGNOSIS — E119 Type 2 diabetes mellitus without complications: Secondary | ICD-10-CM | POA: Diagnosis not present

## 2014-02-06 DIAGNOSIS — M47896 Other spondylosis, lumbar region: Secondary | ICD-10-CM | POA: Diagnosis not present

## 2014-02-06 DIAGNOSIS — M4316 Spondylolisthesis, lumbar region: Secondary | ICD-10-CM | POA: Diagnosis not present

## 2014-02-06 DIAGNOSIS — M5136 Other intervertebral disc degeneration, lumbar region: Secondary | ICD-10-CM | POA: Diagnosis not present

## 2014-02-06 DIAGNOSIS — M419 Scoliosis, unspecified: Secondary | ICD-10-CM | POA: Diagnosis not present

## 2014-02-06 DIAGNOSIS — M5137 Other intervertebral disc degeneration, lumbosacral region: Secondary | ICD-10-CM | POA: Diagnosis not present

## 2014-02-06 DIAGNOSIS — M47816 Spondylosis without myelopathy or radiculopathy, lumbar region: Secondary | ICD-10-CM | POA: Diagnosis not present

## 2014-02-08 ENCOUNTER — Encounter (INDEPENDENT_AMBULATORY_CARE_PROVIDER_SITE_OTHER): Payer: Self-pay

## 2014-02-10 DIAGNOSIS — H539 Unspecified visual disturbance: Secondary | ICD-10-CM

## 2014-02-10 HISTORY — DX: Unspecified visual disturbance: H53.9

## 2014-02-21 ENCOUNTER — Ambulatory Visit (INDEPENDENT_AMBULATORY_CARE_PROVIDER_SITE_OTHER): Payer: Medicare Other | Admitting: Internal Medicine

## 2014-02-21 ENCOUNTER — Encounter (INDEPENDENT_AMBULATORY_CARE_PROVIDER_SITE_OTHER): Payer: Self-pay | Admitting: Internal Medicine

## 2014-02-21 VITALS — BP 124/68 | HR 74 | Temp 98.1°F | Resp 18 | Ht 60.0 in | Wt 145.6 lb

## 2014-02-21 DIAGNOSIS — R1030 Lower abdominal pain, unspecified: Secondary | ICD-10-CM

## 2014-02-21 DIAGNOSIS — K220 Achalasia of cardia: Secondary | ICD-10-CM

## 2014-02-21 DIAGNOSIS — M5126 Other intervertebral disc displacement, lumbar region: Secondary | ICD-10-CM | POA: Diagnosis not present

## 2014-02-21 DIAGNOSIS — M4056 Lordosis, unspecified, lumbar region: Secondary | ICD-10-CM | POA: Diagnosis not present

## 2014-02-21 DIAGNOSIS — M5136 Other intervertebral disc degeneration, lumbar region: Secondary | ICD-10-CM | POA: Diagnosis not present

## 2014-02-21 DIAGNOSIS — M4316 Spondylolisthesis, lumbar region: Secondary | ICD-10-CM | POA: Diagnosis not present

## 2014-02-21 DIAGNOSIS — K5289 Other specified noninfective gastroenteritis and colitis: Secondary | ICD-10-CM

## 2014-02-21 DIAGNOSIS — M438X6 Other specified deforming dorsopathies, lumbar region: Secondary | ICD-10-CM | POA: Diagnosis not present

## 2014-02-21 MED ORDER — MESALAMINE 1000 MG RE SUPP: 1000.0000 mg | Freq: Every day | RECTAL | Status: DC

## 2014-02-21 MED ORDER — HYOSCYAMINE SULFATE 0.125 MG SL SUBL: 0.1250 mg | SUBLINGUAL_TABLET | SUBLINGUAL | Status: DC | PRN

## 2014-02-21 NOTE — Patient Instructions (Signed)
Hyoscyamine or Levsin sublingual one before each meal for 1 week and then as needed. Try nitroglycerin sublingual before each meal for 1 week and then when necessary. Progress report in one week

## 2014-02-21 NOTE — Progress Notes (Signed)
Presenting complaint;  Lower abdominal pain, dysphagia and passage of stools per rectum.  Subjective:  Patient is 81 year old Caucasian female who has achalasia and history of colon carcinoma with ileostomy dating back to 2010. She presents with worsening dysphagia. She has difficulty swallowing liquids and solids virtually every day. 2 days ago she had chest pain described as cutting pain not relieved with nitroglycerin which she decided not to go to emergency room. She developed headache with nitroglycerin and pain eventually resolved. Achalasia was diagnosed over 6 years ago and she's been treated with Botox injection therapy. 4 session was at [facility] in 2009 she had Botox injection in January 2013 and again in July 2014. She states she had relief for a few weeks following her last injection. She also complains of hypogastric pain every time she eats. She also complains of frequent urge to have a bowel movement and she is still passing brownish liquid and dry stool. She doesn't understand why this is happening even though she had ileostomy in 2010. She denies passing bright red blood per rectum. She does not have a good appetite. She has lost 2 pounds since her last visit 6 months ago.   Current Medications: Outpatient Encounter Prescriptions as of 02/21/2014  Medication Sig  . ALPRAZolam (XANAX) 0.5 MG tablet Take 0.5 mg by mouth at bedtime.   Marland Kitchen amitriptyline (ELAVIL) 10 MG tablet Take 10 mg by mouth at bedtime.   Marland Kitchen aspirin EC 325 MG tablet Take 325 mg by mouth daily.  . carvedilol (COREG) 12.5 MG tablet Take 1 tablet (12.5 mg total) by mouth 2 (two) times daily with a meal.  . digoxin (LANOXIN) 0.125 MG tablet TAKE ONE TABLET BY MOUTH ONCE DAILY  . HYDROcodone-acetaminophen (NORCO/VICODIN) 5-325 MG per tablet Take 1 tablet by mouth every 4 (four) hours as needed for moderate pain.  Marland Kitchen levothyroxine (SYNTHROID, LEVOTHROID) 75 MCG tablet Take 75 mcg by mouth daily.   Marland Kitchen  losartan-hydrochlorothiazide (HYZAAR) 100-12.5 MG per tablet Take 1 tablet by mouth daily.  . metoCLOPramide (REGLAN) 5 MG tablet Take 5 mg by mouth 2 (two) times daily before a meal.  . Multiple Vitamins-Minerals (EQL GUMMY ADULT) CHEW Chew 2 tablets by mouth daily.   Marland Kitchen NITROSTAT 0.4 MG SL tablet DISSOLVE ONE TABLET UNDER THE TONGUE EVERY 5 MINUTES AS NEEDED FOR CHEST PAIN.  DO NOT EXCEED A TOTAL OF 3 DOSES IN 15 MINUTES  . Omega-3 Fatty Acids (FISH OIL) 1200 MG CAPS Take 1,200 mg by mouth daily.  . ondansetron (ZOFRAN-ODT) 4 MG disintegrating tablet Take 1 tablet (4 mg total) by mouth every 8 (eight) hours as needed for nausea or vomiting.  . pantoprazole (PROTONIX) 40 MG tablet Take 40 mg by mouth daily.   . bifidobacterium infantis (ALIGN) capsule Take 1 capsule by mouth daily. (Patient not taking: Reported on 02/21/2014)  . [DISCONTINUED] Cyanocobalamin (VITAMIN B 12 PO) Take 1,000 mcg by mouth daily.  . [DISCONTINUED] hyoscyamine (LEVSIN SL) 0.125 MG SL tablet Place 1 tablet (0.125 mg total) under the tongue every 6 (six) hours as needed. (Patient not taking: Reported on 02/21/2014)     Objective: Blood pressure 124/68, pulse 74, temperature 98.1 F (36.7 C), temperature source Oral, resp. rate 18, height 5' (1.524 m), weight 145 lb 9.6 oz (66.044 kg). Patient is alert and in no acute distress. Conjunctiva is pink. Sclera is nonicteric Oropharyngeal mucosa is normal. No neck masses or thyromegaly noted. Cardiac exam with regular rhythm normal S1 and S2. No  murmur or gallop noted. Lungs are clear to auscultation. Abdomen. She has ileostomy in right lower quadrant. Ileostomy back as greenish liquid stool. Abdomen is asymmetric with bulge around the stoma. Abdomen however is soft and nontender without organomegaly or masses. No LE edema or clubbing noted.   Assessment:  #1. 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 She may also have IBS. #2. Dysphagia  secondary to known diagnosis of achalasia. Botox injection therapy 6 months ago provided transient relief. Patient has declined surgical therapy previously. #3. History of colon carcinoma. First primary was in 43s and second primary was in 2010. She had anastomotic leak with peritonitis in 2010 leading to ileostomy. She had sigmoidoscopy of extruded segment in August 2013 revealing narrowed sigmoid colon but she still had fecal matter in sigmoid colon and rectum.     Plan:  Hyoscyamine sublingual before each meal. Nitroglycerin sublingual before each meal if tolerated. Canasa suppository 1 g per rectum daily at bedtime for 1 month. We will find out if endoscopic myotomy is is being performed by Dr. Gayleen Orem of [facility]. Progress report in 1 week. Office visit in 3 months.

## 2014-02-24 ENCOUNTER — Telehealth (INDEPENDENT_AMBULATORY_CARE_PROVIDER_SITE_OTHER): Payer: Self-pay | Admitting: *Deleted

## 2014-02-24 ENCOUNTER — Other Ambulatory Visit (HOSPITAL_COMMUNITY): Payer: Self-pay | Admitting: Oncology

## 2014-02-24 ENCOUNTER — Encounter (HOSPITAL_COMMUNITY): Payer: Medicare Other | Attending: Hematology and Oncology

## 2014-02-24 DIAGNOSIS — C189 Malignant neoplasm of colon, unspecified: Secondary | ICD-10-CM | POA: Insufficient documentation

## 2014-02-24 DIAGNOSIS — K909 Intestinal malabsorption, unspecified: Secondary | ICD-10-CM

## 2014-02-24 DIAGNOSIS — D509 Iron deficiency anemia, unspecified: Secondary | ICD-10-CM | POA: Diagnosis not present

## 2014-02-24 DIAGNOSIS — E611 Iron deficiency: Secondary | ICD-10-CM

## 2014-02-24 LAB — IRON AND TIBC
Iron: 58 ug/dL (ref 42–145)
SATURATION RATIOS: 16 % — AB (ref 20–55)
TIBC: 373 ug/dL (ref 250–470)
UIBC: 315 ug/dL (ref 125–400)

## 2014-02-24 LAB — CBC WITH DIFFERENTIAL/PLATELET
BASOS ABS: 0 10*3/uL (ref 0.0–0.1)
Basophils Relative: 1 % (ref 0–1)
EOS ABS: 0.2 10*3/uL (ref 0.0–0.7)
Eosinophils Relative: 3 % (ref 0–5)
HCT: 34.7 % — ABNORMAL LOW (ref 36.0–46.0)
Hemoglobin: 11.2 g/dL — ABNORMAL LOW (ref 12.0–15.0)
LYMPHS PCT: 27 % (ref 12–46)
Lymphs Abs: 1.3 10*3/uL (ref 0.7–4.0)
MCH: 29.3 pg (ref 26.0–34.0)
MCHC: 32.3 g/dL (ref 30.0–36.0)
MCV: 90.8 fL (ref 78.0–100.0)
MONO ABS: 0.6 10*3/uL (ref 0.1–1.0)
Monocytes Relative: 12 % (ref 3–12)
Neutro Abs: 2.8 10*3/uL (ref 1.7–7.7)
Neutrophils Relative %: 57 % (ref 43–77)
Platelets: 201 10*3/uL (ref 150–400)
RBC: 3.82 MIL/uL — ABNORMAL LOW (ref 3.87–5.11)
RDW: 13.5 % (ref 11.5–15.5)
WBC: 4.9 10*3/uL (ref 4.0–10.5)

## 2014-02-24 LAB — FERRITIN: Ferritin: 24 ng/mL (ref 10–291)

## 2014-02-24 NOTE — Progress Notes (Signed)
Karen [name] presented for labwork. Labs per MD order drawn via Peripheral Line 23 gauge needle inserted in RT AC  Good blood return present. Procedure without incident.  Needle removed intact. Patient tolerated procedure well.

## 2014-02-24 NOTE — Telephone Encounter (Signed)
Canasa to expensive. $200.00 after insurance. Per Dr.Rehman - patient may try Pro foam 2.5 % cream- apply rectally at bedtime 1 refill. This was called to [facility]/ [place]. They will contact the patient.

## 2014-02-25 ENCOUNTER — Other Ambulatory Visit (HOSPITAL_COMMUNITY): Payer: Self-pay | Admitting: Oncology

## 2014-02-27 ENCOUNTER — Telehealth (HOSPITAL_COMMUNITY): Payer: Self-pay | Admitting: *Deleted

## 2014-02-27 ENCOUNTER — Other Ambulatory Visit (HOSPITAL_COMMUNITY): Payer: Self-pay | Admitting: Oncology

## 2014-02-27 ENCOUNTER — Telehealth (INDEPENDENT_AMBULATORY_CARE_PROVIDER_SITE_OTHER): Payer: Self-pay | Admitting: *Deleted

## 2014-02-27 ENCOUNTER — Encounter (HOSPITAL_COMMUNITY): Payer: Self-pay | Admitting: Oncology

## 2014-02-27 DIAGNOSIS — E611 Iron deficiency: Secondary | ICD-10-CM

## 2014-02-27 DIAGNOSIS — D500 Iron deficiency anemia secondary to blood loss (chronic): Secondary | ICD-10-CM

## 2014-02-27 NOTE — Telephone Encounter (Signed)
Patient states she was given iron pills several years ago but to the best of her memory she could not tolerate them so she stopped.

## 2014-02-27 NOTE — Telephone Encounter (Signed)
   Diagnosis:    Result(s)   Card 1: Positive:              Completed by: Lyrick Worland,LPN   HEMOCCULT SENSA DEVELOPER: LOT#:  [id number] EXPIRATION DATE: 09/17   HEMOCCULT SENSA CARD:  LOT#:  02/14 EXPIRATION DATE: 07/18   CARD CONTROL RESULTS:  POSITIVE: Postitive NEGATIVE: Negative    ADDITIONAL COMMENTS: Forwarded to [place] for review

## 2014-02-27 NOTE — Telephone Encounter (Signed)
Patient has tried Iron po in the past and had poor tolerance. Was unable to take due to side effects.

## 2014-03-02 ENCOUNTER — Encounter (HOSPITAL_COMMUNITY): Payer: Self-pay

## 2014-03-02 ENCOUNTER — Encounter (HOSPITAL_BASED_OUTPATIENT_CLINIC_OR_DEPARTMENT_OTHER): Payer: Medicare Other

## 2014-03-02 VITALS — BP 120/39 | HR 69 | Temp 97.7°F | Resp 18

## 2014-03-02 DIAGNOSIS — E611 Iron deficiency: Secondary | ICD-10-CM

## 2014-03-02 MED ORDER — SODIUM CHLORIDE 0.9 % IV SOLN
INTRAVENOUS | Status: DC
  Administered 2014-03-02: 14:00:00 via INTRAVENOUS

## 2014-03-02 MED ORDER — SODIUM CHLORIDE 0.9 % IV SOLN
250.0000 mg | Freq: Once | INTRAVENOUS | Status: AC
  Administered 2014-03-02: 250 mg via INTRAVENOUS
  Filled 2014-03-02: qty 20

## 2014-03-02 NOTE — Patient Instructions (Signed)
[place] at [facility]  Discharge Instructions:  You had an iron infusion today.  Please follow up as scheduled if you have any questions or concerns call the clinic _______________________________________________________________  Thank you for choosing [place] at [facility] to provide your oncology and hematology care.  To afford each patient quality time with our providers, please arrive at least 15 minutes before your scheduled appointment.  You need to re-schedule your appointment if you arrive 10 or more minutes late.  We strive to give you quality time with our providers, and arriving late affects you and other patients whose appointments are after yours.  Also, if you no show three or more times for appointments you may be dismissed from the clinic.  Again, thank you for choosing [place] [facility] [place] hope is that these requests will allow you access to exceptional care and in a timely manner. _______________________________________________________________  If you have questions after your visit, please contact our office at [phone] between the hours of 8:30 a.m. and 5:00 p.m. Voicemails left after 4:30 p.m. will not be returned until the following business day. _______________________________________________________________  For prescription refill requests, have your pharmacy contact our office. _______________________________________________________________  Recommendations made by the consultant and any test results will be sent to your referring physician. _______________________________________________________________

## 2014-03-02 NOTE — Progress Notes (Signed)
Karen [name] Tolerated iron infusion well. Discharged ambulatory

## 2014-03-03 ENCOUNTER — Ambulatory Visit (HOSPITAL_COMMUNITY): Payer: Medicare Other

## 2014-03-12 NOTE — Telephone Encounter (Signed)
Dr. Lysle Rubens of [facility] returned my call and said they were not doing endoscopic myotomy for achalasia the present time but will start doing this procedure later this year.

## 2014-03-15 ENCOUNTER — Encounter (INDEPENDENT_AMBULATORY_CARE_PROVIDER_SITE_OTHER): Payer: Self-pay | Admitting: General Surgery

## 2014-03-15 DIAGNOSIS — K220 Achalasia of cardia: Secondary | ICD-10-CM | POA: Diagnosis not present

## 2014-03-15 DIAGNOSIS — R195 Other fecal abnormalities: Secondary | ICD-10-CM | POA: Diagnosis not present

## 2014-03-15 DIAGNOSIS — R1032 Left lower quadrant pain: Secondary | ICD-10-CM | POA: Diagnosis not present

## 2014-03-15 DIAGNOSIS — K5289 Other specified noninfective gastroenteritis and colitis: Secondary | ICD-10-CM | POA: Diagnosis not present

## 2014-03-15 DIAGNOSIS — R1031 Right lower quadrant pain: Secondary | ICD-10-CM | POA: Diagnosis not present

## 2014-03-15 NOTE — Progress Notes (Signed)
Patient ID: VICIE CECH, female   DOB: Mar 25, 1929, 81 y.o.   MRN: [id number] Tahisha H. No Name 03/15/2014 9:36 AM Location: [facility] Patient #: [id number] DOB: 01/16/1930 Married / Language: Cleophus Molt / Race: White Female History of Present Illness Odis Hollingshead MD; 03/15/2014 12:48 PM) Patient words: f/u.  The patient is a 81 year old female    Note:She is self-referred. 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 After she eats, sometimes she gets crampy lower abdominal pain and then has the urge to have a bowel movement. The pain is relieved if she is able to pass a small amount of stool which is the diameter of her small finger. He prescribed some Canasa for her but she is not able to afford it. This has been causing her some anemia as the stools are Hemoccult positive. She has been getting iron infusions for this. She was wanted get my opinion on this. She also has achalasia and has had multiple Botox injections. She has not wanted to have surgery for the achalasia in the past.  Other Problems Marjean Donna, CMA; 03/15/2014 9:36 AM) Back Pain Bladder Problems Cancer Colon Cancer Gastroesophageal Reflux Disease High blood pressure Oophorectomy Bilateral. Thyroid Disease Transfusion history  Past Surgical History ([place]; 03/15/2014 9:36 AM) Appendectomy Breast Biopsy Right. Gallbladder Surgery - Laparoscopic Hip Surgery Left. Hysterectomy (not due to cancer) - Complete Knee Surgery Right. Shoulder Surgery Right. Tonsillectomy  Diagnostic Studies History Marjean Donna, CMA; 03/15/2014 9:36 AM) Mammogram within last year Pap Smear >5 years ago  Allergies Marjean Donna, CMA; 03/15/2014 9:38 AM) Bactrim *ANTI-INFECTIVE AGENTS - MISC.* Lisinopril *ANTIHYPERTENSIVES* Lidocaine *CHEMICALS* Demerol *ANALGESICS - OPIOID* Codeine Phosphate *ANALGESICS -  OPIOID*  Medication History (Sonya Bynum, CMA; 03/15/2014 9:37 AM) ALPRAZolam (0.5MG  Tablet, Oral) Active. Hydrocodone-Acetaminophen (5-325MG  Tablet, Oral) Active. Amitriptyline HCl (10MG  Tablet, Oral) Active. Carvedilol (12.5MG  Tablet, Oral) Active. Digoxin (125MCG Tablet, Oral) Active. Fluticasone Propionate (50MCG/ACT Suspension, Nasal) Active. Levothyroxine Sodium (75MCG Tablet, Oral) Active. Losartan Potassium-HCTZ (100-12.5MG  Tablet, Oral) Active. Nitrostat (0.4MG  Tab Sublingual, Sublingual as needed) Active. Pantoprazole Sodium (40MG  Tablet DR, Oral) Active.  Social History Marjean Donna, CMA; 03/15/2014 9:36 AM) Caffeine use Tea. No alcohol use No drug use Tobacco use Former smoker.  Family History Marjean Donna, [place]; 03/15/2014 9:36 AM) Arthritis Brother, Daughter, Father, Mother. Cancer Brother, Father, Mother. Cerebrovascular Accident Mother, Sister. Colon Cancer Brother. Heart disease in female family member before age 57 Hypertension Sister. Ischemic Bowel Disease Mother.  Pregnancy / Birth History Marjean Donna, [place]; 03/15/2014 9:36 AM) Age at menarche 32 years. Gravida 4 Maternal age 108-20 Para 4     Review of Systems ([place]; 03/15/2014 9:36 AM) General Present- Fatigue. Not Present- Appetite Loss, Chills, Fever, Night Sweats, Weight Gain and Weight Loss. HEENT Present- Hearing Loss and Wears glasses/contact lenses. Not Present- Earache, Hoarseness, Nose Bleed, Oral Ulcers, Ringing in the Ears, Seasonal Allergies, Sinus Pain, Sore Throat, Visual Disturbances and Yellow Eyes. Gastrointestinal Present- Abdominal Pain, Bloating, Bloody Stool, Difficulty Swallowing and Indigestion. Not Present- Change in Bowel Habits, Chronic diarrhea, Constipation, Excessive gas, Gets full quickly at meals, Hemorrhoids, Nausea, Rectal Pain and Vomiting. Musculoskeletal Present- Back Pain, Joint Pain and Joint Stiffness. Not Present- Muscle Pain, Muscle  Weakness and Swelling of Extremities. Neurological Present- Headaches. Not Present- Decreased Memory, Fainting, Numbness, Seizures, Tingling, Tremor, Trouble walking and Weakness. Endocrine Present- Hair Changes. Not Present- Cold Intolerance, Excessive Hunger, Heat Intolerance,  Hot flashes and New Diabetes.  Vitals (Sonya Bynum CMA; 03/15/2014 9:36 AM) 03/15/2014 9:36 AM Weight: 135 lb Height: 60in Body Surface Area: 1.61 m Body Mass Index: 26.37 kg/m Temp.: 97.74F(Temporal)  Pulse: 76 (Regular)  BP: 128/76 (Sitting, Left Arm, Standard)     Physical Exam Odis Hollingshead MD; 03/15/2014 12:47 PM)  The physical exam findings are as follows: Note:General-elderly female who was hard of hearing and mildly debilitated. She is here with her husband and daughter.  Abdomen-bucket-handle scar with central bulging consistent with hernia. Right lower quadrant ileostomy with parastomal bulging.  Anorectal-external skin tag left lateral position. On digital rectal exam no masses are felt. Dark stools present that is Hemoccult positive.  Anoscopy-thick dark stool was removed. No mucosal lesions or masses in the very distal rectum or anus.    Assessment & Plan Odis Hollingshead MD; 03/15/2014 12:48 PM)  ABDOMINAL PAIN, BILATERAL LOWER QUADRANT (789.03  R10.31) Impression: Related to the colitis.  Current Plans Instructions: I agree with Dr. Laural Golden that the most common cause of your problem is diversion colitis. The Canasa suppository is a good treatment for this is it can be made affordable for you. I will let him know that you cannot afford that medication and have him see if there is another alternative that is more affordable. As for surgery for achalasia, I feel the risks far outweigh the benefits. 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 Laural Golden. She is unable to afford the medication he prescribed.  Plan: I will send him a note to see if there is an alternative treatment she can have or she gets some sort of subsidized medication.  ACHALASIA (530.0  K22.0) Impression: Not a surgical candidate.  Jackolyn Confer, MD

## 2014-04-04 ENCOUNTER — Telehealth (INDEPENDENT_AMBULATORY_CARE_PROVIDER_SITE_OTHER): Payer: Self-pay | Admitting: *Deleted

## 2014-04-04 NOTE — Telephone Encounter (Signed)
Karen [name] seen Karen [name] about 3 week ago and Karen [name] was going to give him a call. She has not heard anything and would like to know if they got to speak with each other? She is also having abd pain and would like to know what she can take. At her last apt Karen [name] prescribed her something that cost to much. Her return phone number is [phone].

## 2014-04-06 NOTE — Telephone Encounter (Signed)
Discussed with Dr.Rehman on the 23 rd. This will need to be discussed again prior to the patient being called.

## 2014-04-07 ENCOUNTER — Other Ambulatory Visit (INDEPENDENT_AMBULATORY_CARE_PROVIDER_SITE_OTHER): Payer: Self-pay | Admitting: *Deleted

## 2014-04-07 DIAGNOSIS — R1030 Lower abdominal pain, unspecified: Secondary | ICD-10-CM

## 2014-04-07 MED ORDER — HYOSCYAMINE SULFATE 0.125 MG SL SUBL: 0.1250 mg | SUBLINGUAL_TABLET | SUBLINGUAL | Status: DC | PRN

## 2014-04-07 NOTE — Telephone Encounter (Signed)
Per Dr.Rehman he has talked with the physician.He does not do the surgery,however his partner does. The only other medication for pain relief would be pain medication. Would ask patient to check with other pharmacies about the previous price of the prescribed medication, to see if it may be cheaper. Patient was called and made aware.

## 2014-04-07 NOTE — Telephone Encounter (Signed)
Per Dr.Rehman may refill this medication. 

## 2014-04-07 NOTE — Telephone Encounter (Signed)
Per Dr.Rehman no Colonoscopy at this time. Blood is from the colitis. May refill Hyoscyamin/faxed to patient's pharmacy.

## 2014-04-07 NOTE — Telephone Encounter (Signed)
Patient states that she will call about the Canasa Suppositories to get prices. She is asking about a Colonoscopy being done. Her hemoccult cards here and at Dr. Bertrum Sol were positive. Having pain in the bottom of her abdomen.04/06/14 patient states that she had stool coming through her rectum.  She also ask if she can have a refill on the hyoscyamin. To address with Dr. Laural Golden.

## 2014-04-11 ENCOUNTER — Encounter (HOSPITAL_COMMUNITY): Payer: Medicare Other | Attending: Hematology and Oncology

## 2014-04-11 DIAGNOSIS — E611 Iron deficiency: Secondary | ICD-10-CM | POA: Diagnosis not present

## 2014-04-11 DIAGNOSIS — C189 Malignant neoplasm of colon, unspecified: Secondary | ICD-10-CM | POA: Diagnosis not present

## 2014-04-11 DIAGNOSIS — D509 Iron deficiency anemia, unspecified: Secondary | ICD-10-CM | POA: Diagnosis not present

## 2014-04-11 DIAGNOSIS — D500 Iron deficiency anemia secondary to blood loss (chronic): Secondary | ICD-10-CM

## 2014-04-11 LAB — CBC
HCT: 34 % — ABNORMAL LOW (ref 36.0–46.0)
Hemoglobin: 11 g/dL — ABNORMAL LOW (ref 12.0–15.0)
MCH: 29.7 pg (ref 26.0–34.0)
MCHC: 32.4 g/dL (ref 30.0–36.0)
MCV: 91.9 fL (ref 78.0–100.0)
Platelets: 152 10*3/uL (ref 150–400)
RBC: 3.7 MIL/uL — AB (ref 3.87–5.11)
RDW: 12.7 % (ref 11.5–15.5)
WBC: 5.2 10*3/uL (ref 4.0–10.5)

## 2014-04-11 LAB — FERRITIN: Ferritin: 54 ng/mL (ref 10–291)

## 2014-04-11 NOTE — Progress Notes (Signed)
LABS FOR CBC,FERR 

## 2014-04-12 ENCOUNTER — Other Ambulatory Visit (HOSPITAL_COMMUNITY): Payer: Self-pay | Admitting: Oncology

## 2014-04-12 DIAGNOSIS — D500 Iron deficiency anemia secondary to blood loss (chronic): Secondary | ICD-10-CM

## 2014-04-13 ENCOUNTER — Encounter (HOSPITAL_COMMUNITY): Payer: Self-pay

## 2014-04-13 ENCOUNTER — Encounter (HOSPITAL_BASED_OUTPATIENT_CLINIC_OR_DEPARTMENT_OTHER): Payer: Medicare Other

## 2014-04-13 VITALS — BP 127/52 | HR 71 | Temp 97.0°F | Resp 18

## 2014-04-13 DIAGNOSIS — E611 Iron deficiency: Secondary | ICD-10-CM | POA: Diagnosis not present

## 2014-04-13 DIAGNOSIS — D500 Iron deficiency anemia secondary to blood loss (chronic): Secondary | ICD-10-CM

## 2014-04-13 MED ORDER — SODIUM CHLORIDE 0.9 % IV SOLN
250.0000 mg | Freq: Once | INTRAVENOUS | Status: AC
  Administered 2014-04-13: 250 mg via INTRAVENOUS
  Filled 2014-04-13: qty 20

## 2014-04-13 NOTE — Patient Instructions (Signed)
[place] at [facility] Discharge Instructions  RECOMMENDATIONS MADE BY THE CONSULTANT AND ANY TEST RESULTS WILL BE SENT TO YOUR REFERRING PHYSICIAN.  Today you received iron. You have an appointment in April for labs and in May for an office visit with the Doctor. Call if you have questions or concerns.  Thank you for choosing [place] at [facility] to provide your oncology and hematology care.  To afford each patient quality time with our provider, please arrive at least 15 minutes before your scheduled appointment time.    You need to re-schedule your appointment should you arrive 10 or more minutes late.  We strive to give you quality time with our providers, and arriving late affects you and other patients whose appointments are after yours.  Also, if you no show three or more times for appointments you may be dismissed from the clinic at the providers discretion.     Again, thank you for choosing [facility].  Our hope is that these requests will decrease the amount of time that you wait before being seen by our physicians.       _____________________________________________________________  Should you have questions after your visit to [facility], please contact our office at [phone] between the hours of 8:30 a.m. and 4:30 p.m.  Voicemails left after 4:30 p.m. will not be returned until the following business day.  For prescription refill requests, have your pharmacy contact our office.

## 2014-04-13 NOTE — Progress Notes (Signed)
Karen [name] Tolerated iron infusion well today

## 2014-05-16 ENCOUNTER — Other Ambulatory Visit (HOSPITAL_COMMUNITY): Payer: Self-pay

## 2014-05-23 ENCOUNTER — Other Ambulatory Visit (HOSPITAL_COMMUNITY)
Admission: RE | Admit: 2014-05-23 | Discharge: 2014-05-23 | Disposition: A | Discharge status: Home / Self Care | Payer: Medicare Other | Source: Other Acute Inpatient Hospital | Attending: Family Medicine | Admitting: Family Medicine

## 2014-05-23 ENCOUNTER — Encounter (HOSPITAL_COMMUNITY): Payer: Medicare Other | Attending: Hematology & Oncology

## 2014-05-23 DIAGNOSIS — Z79899 Other long term (current) drug therapy: Secondary | ICD-10-CM | POA: Diagnosis not present

## 2014-05-23 DIAGNOSIS — E780 Pure hypercholesterolemia: Secondary | ICD-10-CM | POA: Diagnosis not present

## 2014-05-23 DIAGNOSIS — D500 Iron deficiency anemia secondary to blood loss (chronic): Secondary | ICD-10-CM | POA: Diagnosis not present

## 2014-05-23 DIAGNOSIS — E611 Iron deficiency: Secondary | ICD-10-CM

## 2014-05-23 DIAGNOSIS — E039 Hypothyroidism, unspecified: Secondary | ICD-10-CM | POA: Diagnosis not present

## 2014-05-23 LAB — COMPREHENSIVE METABOLIC PANEL
ALT: 22 U/L (ref 0–35)
ANION GAP: 10 (ref 5–15)
AST: 31 U/L (ref 0–37)
Albumin: 3.9 g/dL (ref 3.5–5.2)
Alkaline Phosphatase: 70 U/L (ref 39–117)
BUN: 31 mg/dL — AB (ref 6–23)
CHLORIDE: 103 mmol/L (ref 96–112)
CO2: 27 mmol/L (ref 19–32)
CREATININE: 1.2 mg/dL — AB (ref 0.50–1.10)
Calcium: 9.2 mg/dL (ref 8.4–10.5)
GFR calc non Af Amer: 41 mL/min — ABNORMAL LOW (ref >90)
GFR, EST AFRICAN AMERICAN: 47 mL/min — AB (ref >90)
Glucose, Bld: 114 mg/dL — ABNORMAL HIGH (ref 70–99)
POTASSIUM: 4 mmol/L (ref 3.5–5.1)
Sodium: 140 mmol/L (ref 135–145)
TOTAL PROTEIN: 7.7 g/dL (ref 6.0–8.3)
Total Bilirubin: 0.5 mg/dL (ref 0.3–1.2)

## 2014-05-23 LAB — CBC WITH DIFFERENTIAL/PLATELET
BASOS ABS: 0 10*3/uL (ref 0.0–0.1)
BASOS PCT: 0 % (ref 0–1)
Eosinophils Absolute: 0.1 10*3/uL (ref 0.0–0.7)
Eosinophils Relative: 3 % (ref 0–5)
HCT: 36.5 % (ref 36.0–46.0)
Hemoglobin: 12 g/dL (ref 12.0–15.0)
Lymphocytes Relative: 23 % (ref 12–46)
Lymphs Abs: 1.2 10*3/uL (ref 0.7–4.0)
MCH: 29.9 pg (ref 26.0–34.0)
MCHC: 32.9 g/dL (ref 30.0–36.0)
MCV: 90.8 fL (ref 78.0–100.0)
Monocytes Absolute: 0.5 10*3/uL (ref 0.1–1.0)
Monocytes Relative: 10 % (ref 3–12)
NEUTROS ABS: 3.4 10*3/uL (ref 1.7–7.7)
NEUTROS PCT: 64 % (ref 43–77)
Platelets: 185 10*3/uL (ref 150–400)
RBC: 4.02 MIL/uL (ref 3.87–5.11)
RDW: 13 % (ref 11.5–15.5)
WBC: 5.3 10*3/uL (ref 4.0–10.5)

## 2014-05-23 LAB — LIPID PANEL
Cholesterol: 180 mg/dL (ref 0–200)
HDL: 45 mg/dL (ref >39)
LDL CALC: 94 mg/dL (ref 0–99)
Total CHOL/HDL Ratio: 4 RATIO
Triglycerides: 207 mg/dL — ABNORMAL HIGH (ref <150)
VLDL: 41 mg/dL — ABNORMAL HIGH (ref 0–40)

## 2014-05-23 LAB — FERRITIN: Ferritin: 136 ng/mL (ref 10–291)

## 2014-05-23 LAB — TSH: TSH: 2 u[IU]/mL (ref 0.350–4.500)

## 2014-05-23 NOTE — Progress Notes (Signed)
Labs drawn

## 2014-05-24 LAB — HEMOGLOBIN A1C
Hgb A1c MFr Bld: 6.5 % — ABNORMAL HIGH (ref 4.8–5.6)
Mean Plasma Glucose: 140 mg/dL

## 2014-05-29 ENCOUNTER — Encounter (INDEPENDENT_AMBULATORY_CARE_PROVIDER_SITE_OTHER): Payer: Self-pay | Admitting: Internal Medicine

## 2014-05-29 ENCOUNTER — Ambulatory Visit (INDEPENDENT_AMBULATORY_CARE_PROVIDER_SITE_OTHER): Payer: Medicare Other | Admitting: Internal Medicine

## 2014-05-29 VITALS — BP 120/74 | HR 76 | Temp 98.9°F | Resp 18 | Ht 60.0 in | Wt 145.9 lb

## 2014-05-29 DIAGNOSIS — I10 Essential (primary) hypertension: Secondary | ICD-10-CM | POA: Diagnosis not present

## 2014-05-29 DIAGNOSIS — K220 Achalasia of cardia: Secondary | ICD-10-CM | POA: Diagnosis not present

## 2014-05-29 DIAGNOSIS — F411 Generalized anxiety disorder: Secondary | ICD-10-CM | POA: Diagnosis not present

## 2014-05-29 DIAGNOSIS — K5289 Other specified noninfective gastroenteritis and colitis: Secondary | ICD-10-CM | POA: Diagnosis not present

## 2014-05-29 DIAGNOSIS — Z85038 Personal history of other malignant neoplasm of large intestine: Secondary | ICD-10-CM | POA: Diagnosis not present

## 2014-05-29 DIAGNOSIS — E780 Pure hypercholesterolemia: Secondary | ICD-10-CM | POA: Diagnosis not present

## 2014-05-29 DIAGNOSIS — M797 Fibromyalgia: Secondary | ICD-10-CM | POA: Diagnosis not present

## 2014-05-29 DIAGNOSIS — M545 Low back pain: Secondary | ICD-10-CM | POA: Diagnosis not present

## 2014-05-29 DIAGNOSIS — E039 Hypothyroidism, unspecified: Secondary | ICD-10-CM | POA: Diagnosis not present

## 2014-05-29 DIAGNOSIS — E119 Type 2 diabetes mellitus without complications: Secondary | ICD-10-CM | POA: Diagnosis not present

## 2014-05-29 NOTE — Patient Instructions (Addendum)
Virtual colonoscopy be scheduled. Call if you experience any side effects with Canasa or mesalamine suppository. CEA to be done with next blood work.

## 2014-05-29 NOTE — Progress Notes (Signed)
Presenting complaint;  Follow-up for achalasia and rectal bleeding.  Subjective:  Patient is 81 year old Caucasian female who is here for scheduled visit accompanied by her daughter. She was last seen on 02/21/2014. She has history of colon carcinoma and has permanent ostomy. She still has rectum sigmoid colon and descending colon in situ. She has multiple complaints. She still is having lower abdominal pain and urgency and still passing scant amount of stool per rectum. She also has been passing blood-tinged mucus and blood per rectum but not in the last 3 weeks. He also complains of pain in left upper quadrant radiating to his back. She denies bleeding or melena into ileostomy. She did have her stools checked was negative for blood. She was given prescription for Canasa suppositories but co-pay was too high. We were able to get samples for her but she has not used them so far as she has concern about sulfa allergy. She says dysphagia is about the same. She eats slowly and sticks with soft foods. Some days she has minimal to no difficulty swallowing. About 2 months ago she was seen by Dr. 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 Regarding her achalasia he felt the risk outweighs the benefit. She had Botox injection therapy in July 2015 providing short-lived benefit. She has been receiving iron infusion by oncology clinic and she says last count was normal. Patient remains concerned about recurrence of colon cancer and is interested in surveillance.    Current Medications: Outpatient Encounter Prescriptions as of 05/29/2014  Medication Sig  . ALPRAZolam (XANAX) 0.5 MG tablet Take 0.5 mg by mouth at bedtime.   Marland Kitchen amitriptyline (ELAVIL) 10 MG tablet Take 20 mg by mouth at bedtime.   Marland Kitchen aspirin EC 325 MG tablet Take 325 mg by mouth daily.  . carvedilol (COREG) 12.5 MG tablet Take 1 tablet (12.5 mg total) by mouth 2 (two) times daily with a meal.  . digoxin (LANOXIN) 0.125 MG tablet TAKE  ONE TABLET BY MOUTH ONCE DAILY  . HYDROcodone-acetaminophen (NORCO/VICODIN) 5-325 MG per tablet Take 1 tablet by mouth every 4 (four) hours as needed for moderate pain.  . hyoscyamine (LEVSIN SL) 0.125 MG SL tablet Place 1 tablet (0.125 mg total) under the tongue every 4 (four) hours as needed.  Marland Kitchen levothyroxine (SYNTHROID, LEVOTHROID) 75 MCG tablet Take 75 mcg by mouth daily.   Marland Kitchen losartan-hydrochlorothiazide (HYZAAR) 100-12.5 MG per tablet Take 1 tablet by mouth daily.  . Multiple Vitamins-Minerals (EQL GUMMY ADULT) CHEW Chew 2 tablets by mouth daily.   Marland Kitchen NITROSTAT 0.4 MG SL tablet DISSOLVE ONE TABLET UNDER THE TONGUE EVERY 5 MINUTES AS NEEDED FOR CHEST PAIN.  DO NOT EXCEED A TOTAL OF 3 DOSES IN 15 MINUTES  . Omega-3 Fatty Acids (FISH OIL) 1200 MG CAPS Take 1,200 mg by mouth daily.  . pantoprazole (PROTONIX) 40 MG tablet Take 40 mg by mouth daily.   . bifidobacterium infantis (ALIGN) capsule Take 1 capsule by mouth daily. (Patient not taking: Reported on 02/21/2014)  . mesalamine (CANASA) 1000 MG suppository Place 1 suppository (1,000 mg total) rectally at bedtime. (Patient not taking: Reported on 04/13/2014)  . ondansetron (ZOFRAN-ODT) 4 MG disintegrating tablet Take 1 tablet (4 mg total) by mouth every 8 (eight) hours as needed for nausea or vomiting. (Patient not taking: Reported on 04/13/2014)     Objective: Blood pressure 120/74, pulse 76, temperature 98.9 F (37.2 C), temperature source Oral, resp. rate 18, height 5' (1.524 m), weight 145 lb 14.4 oz (66.18  kg). Patient is alert and in no acute distress. Conjunctiva is pink. Sclera is nonicteric Oropharyngeal mucosa is normal. No neck masses or thyromegaly noted. Cardiac exam with regular rhythm normal S1 and S2. No murmur or gallop noted. Lungs are clear to auscultation. Abdomen is full and asymmetric. Ileostomy is located in right lower quadrant and back contains pasty brown stool. Peristomal hernia noted. Abdomen is soft and nontender with  organomegaly or masses. No LE edema or clubbing noted.  Labs/studies Results: Lab data from 05/23/2014  WBC 5.3, H&H 12 and 36.5 and platelet count 180 5K   BUN 31 creatinine 1.20 Sodium 140, potassium 4.0 chloride 103 CO2 27 and glucose 114 Bilirubin 0.5 AP 70 AST 31 ALT 22 and albumin 3.9. Serum calcium is 9.2. Serum ferritin was 136.  CEA was 6.1 on 12/30/2013.  Assessment:  #1. Achalasia. Therapy so far has consisted of Botox injection which has provided modest and short-lived response. She is not interested in laparoscopic myotomy. Endoscopic myotomy is not an option in state of [place] at this time(POEM). Luckily her dysphagia has not progressed. #2. Rectal bleeding. 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 #3. History of CRC. Mildly elevated CEA of 6.1 in November 2015. She is due for several years exam. Remaining colon has atrophied and fixated and therefore complete endoscopic exam not possible. #4. Iron deficiency anemia. She may have iron malabsorption. I doubt that iron deficiency anemia is due to blood loss from excluded segment of lower GI tract.  Plan:  Patient will try mesalamine suppositories one dose every night. Since there is concern for allergy she will take first dose when her daughter is with her for a few hours. If she does not respond to topical mesalamine will consider short chain fatty acid enemas. CEA with next blood draw. Virtual colonoscopy for which she would not need any preparation. Office visit in 3 months.

## 2014-05-30 ENCOUNTER — Encounter (INDEPENDENT_AMBULATORY_CARE_PROVIDER_SITE_OTHER): Payer: Self-pay | Admitting: Internal Medicine

## 2014-05-30 DIAGNOSIS — K529 Noninfective gastroenteritis and colitis, unspecified: Secondary | ICD-10-CM

## 2014-05-30 DIAGNOSIS — Z85038 Personal history of other malignant neoplasm of large intestine: Secondary | ICD-10-CM

## 2014-05-30 DIAGNOSIS — K625 Hemorrhage of anus and rectum: Secondary | ICD-10-CM

## 2014-05-30 NOTE — Progress Notes (Signed)
This encounter was created in error - please disregard.

## 2014-05-31 ENCOUNTER — Other Ambulatory Visit (INDEPENDENT_AMBULATORY_CARE_PROVIDER_SITE_OTHER): Payer: Self-pay | Admitting: Internal Medicine

## 2014-05-31 DIAGNOSIS — Z932 Ileostomy status: Secondary | ICD-10-CM

## 2014-05-31 DIAGNOSIS — Z85038 Personal history of other malignant neoplasm of large intestine: Secondary | ICD-10-CM

## 2014-05-31 DIAGNOSIS — K562 Volvulus: Secondary | ICD-10-CM

## 2014-05-31 DIAGNOSIS — C189 Malignant neoplasm of colon, unspecified: Secondary | ICD-10-CM

## 2014-05-31 DIAGNOSIS — R1012 Left upper quadrant pain: Secondary | ICD-10-CM

## 2014-05-31 DIAGNOSIS — D509 Iron deficiency anemia, unspecified: Secondary | ICD-10-CM

## 2014-05-31 DIAGNOSIS — Q438 Other specified congenital malformations of intestine: Secondary | ICD-10-CM

## 2014-05-31 DIAGNOSIS — K5289 Other specified noninfective gastroenteritis and colitis: Secondary | ICD-10-CM

## 2014-05-31 DIAGNOSIS — K625 Hemorrhage of anus and rectum: Secondary | ICD-10-CM

## 2014-06-06 ENCOUNTER — Encounter: Payer: Self-pay | Admitting: Cardiology

## 2014-06-06 ENCOUNTER — Ambulatory Visit (INDEPENDENT_AMBULATORY_CARE_PROVIDER_SITE_OTHER): Payer: Medicare Other | Admitting: Cardiology

## 2014-06-06 VITALS — BP 122/56 | HR 67 | Ht 60.0 in | Wt 146.4 lb

## 2014-06-06 DIAGNOSIS — I119 Hypertensive heart disease without heart failure: Secondary | ICD-10-CM | POA: Diagnosis not present

## 2014-06-06 DIAGNOSIS — I447 Left bundle-branch block, unspecified: Secondary | ICD-10-CM

## 2014-06-06 DIAGNOSIS — I471 Supraventricular tachycardia: Secondary | ICD-10-CM | POA: Diagnosis not present

## 2014-06-06 DIAGNOSIS — Z932 Ileostomy status: Secondary | ICD-10-CM

## 2014-06-06 NOTE — Progress Notes (Signed)
Cardiology Office Note   Date:  06/06/2014   ID:  Karen [name], DOB [birth year], MRN [id number]  PCP:  [name], MD  Cardiologist:   Darlin Coco, MD   No chief complaint on file.     History of Present Illness: Karen [name] is a 81 y.o. female who presents for a six-month follow-up office visit.  This pleasant 81 year old woman is seen for a scheduled followup office visit. She has a complex past medical history. She has a known left bundle branch block. She does not have coronary disease. She had a normal coronary arteriogram in 2002 and a normal adenosine Cardiolite stress test in 2009. She had an echocardiogram in April 2011 showing normal systolic function with diastolic dysfunction. In April 2014 she complained of chest and jaw pain and underwent a Lexus scan Myoview stress test on 06/03/12 which was normal and showed an ejection fraction of 69% and no ischemia. He's had a past history of swallowing problems secondary to achalasia and has had successful endoscopic injections of Botox into her esophagus.  Her last Botox treatment was in July 2015. She has a remote history of a severe intra-abdominal infection resulting in the need for an ileostomy. She has a past history of colon cancer twice and a history of colon polyps.  Her stool guaiacs have been intermittently positive.  He has had iron deficiency anemia and has received iron transfusions twice at [facility].  Her surgeon Dr. Zella Richer  has discouraged her from having further abdominal surgery. She has a history of hypothyroidism. Since last visit she has continued to have intermittent abdominal pain.  She has had a lot of trouble with a pinched nerve in her lower back radiating into her right leg and also a pinched nerve in her neck.  She continues to have episodes of tachycardia most recently last weekend lasting several hours.  She did not try nitroglycerin because she did not have it with her at the  time  Past Medical History  Diagnosis Date  . Chronic diastolic CHF (congestive heart failure)     a. nl EF by echo 05/2009.  Marland Kitchen DVT of deep femoral vein   . Palpitations   . History of PSVT (paroxysmal supraventricular tachycardia)   . LBBB (left bundle branch block)   . Hypertension   . Abdominal adhesions   . Ileostomy present   . Colon polyp   . Chest pain     a. 2002 Cath: nl cors;  b. 2009 aden mv: nl;  c. 05/2009 Echo: nl.  . Wears dentures   . Breast lump   . Hyperlipidemia   . Thyroid disease   . Anemia   . History of blood clots   . Hernia   . Blood transfusion   . Hearing loss   . Arthritis   . GERD (gastroesophageal reflux disease)   . Cancer     bladder  . Colon cancer     x 2. Last 2010  . Iron deficiency anemia due to chronic blood loss 07/06/2013    Past Surgical History  Procedure Laterality Date  . Cardiac catheterization  12/10/2000    THE LEFT VENTRICLE IS MILDY DILATED. THERE IS MILD TO MODERATE DIFFUSE HYPOKINESIS WITH EF 35%  . Colon cancer surgery    . Ileostomy    . Esophagogastroduodenoscopy  02/13/2011    Procedure: ESOPHAGOGASTRODUODENOSCOPY (EGD);  Surgeon: Rogene Houston, MD;  [facility];  Service: Endoscopy;  Laterality: N/A;  1030  . Colonoscopy  09/26/2011    Procedure: COLONOSCOPY;  Surgeon: Rogene Houston, MD;  [facility];  Service: Endoscopy;  Laterality: N/A;  215  . Cataract extraction w/phaco  11/13/2011    Procedure: CATARACT EXTRACTION PHACO AND INTRAOCULAR LENS PLACEMENT (IOC);  Surgeon: Tonny Branch, MD;  Location: [facility];  Service: Ophthalmology;  Laterality: Right;  CDE 12.26  . Cataract extraction w/phaco  12/08/2011    Procedure: CATARACT EXTRACTION PHACO AND INTRAOCULAR LENS PLACEMENT (IOC);  Surgeon: Tonny Branch, MD;  Location: [facility];  Service: Ophthalmology;  Laterality: Left;  CDE 15.11  . Esophagogastroduodenoscopy N/A 09/01/2013    Procedure: ESOPHAGOGASTRODUODENOSCOPY (EGD);  Surgeon: Rogene Houston, MD;  [facility];  Service: Endoscopy;  Laterality: N/A;  830  . Botox injection N/A 09/01/2013    Procedure: BOTOX INJECTION;  Surgeon: Rogene Houston, MD;  [facility];  Service: Endoscopy;  Laterality: N/A;     Current Outpatient Prescriptions  Medication Sig Dispense Refill  . ALPRAZolam (XANAX) 0.5 MG tablet Take 0.5 mg by mouth at bedtime.     Marland Kitchen amitriptyline (ELAVIL) 10 MG tablet Take 20 mg by mouth at bedtime.     Marland Kitchen aspirin EC 325 MG tablet Take 325 mg by mouth daily.    . bifidobacterium infantis (ALIGN) capsule Take 1 capsule by mouth daily. 14 capsule 0  . carvedilol (COREG) 12.5 MG tablet Take 1 tablet (12.5 mg total) by mouth 2 (two) times daily with a meal. 180 tablet 3  . digoxin (LANOXIN) 0.125 MG tablet TAKE ONE TABLET BY MOUTH ONCE DAILY 90 tablet 3  . HYDROcodone-acetaminophen (NORCO/VICODIN) 5-325 MG per tablet Take 1 tablet by mouth every 4 (four) hours as needed for moderate pain.    . hyoscyamine (LEVSIN SL) 0.125 MG SL tablet Place 0.125 mg under the tongue every 4 (four) hours as needed (lower abdominal pain).    Marland Kitchen ketoconazole (NIZORAL) 2 % cream Apply 1 application topically 2 (two) times daily. Apply to feet twice daily    . levothyroxine (SYNTHROID, LEVOTHROID) 75 MCG tablet Take 75 mcg by mouth daily.     Marland Kitchen losartan-hydrochlorothiazide (HYZAAR) 100-12.5 MG per tablet Take 1 tablet by mouth daily. 90 tablet 3  . mesalamine (CANASA) 1000 MG suppository Place 1 suppository (1,000 mg total) rectally at bedtime. 30 suppository 1  . Multiple Vitamins-Minerals (EQL GUMMY ADULT) CHEW Chew 2 tablets by mouth daily.     Marland Kitchen NITROSTAT 0.4 MG SL tablet DISSOLVE ONE TABLET UNDER THE TONGUE EVERY 5 MINUTES AS NEEDED FOR CHEST PAIN.  DO NOT EXCEED A TOTAL OF 3 DOSES IN 15 MINUTES 25 tablet 0  . Omega-3 Fatty Acids (FISH OIL) 1200 MG CAPS Take 1,200 mg by mouth daily.    . ondansetron (ZOFRAN-ODT) 4 MG disintegrating tablet Take 1 tablet (4 mg total)  by mouth every 8 (eight) hours as needed for nausea or vomiting. 20 tablet 0  . pantoprazole (PROTONIX) 40 MG tablet Take 40 mg by mouth daily.      No current facility-administered medications for this visit.    Allergies:   Bactrim; Lidocaine hcl; Lisinopril; Nitrofurantoin monohyd macro; Ramipril; Codeine; Demerol; and Morphine and related    Social History:  The patient  reports that she quit smoking about 51 years ago. She has never used smokeless tobacco. She reports that she does not drink alcohol or use illicit drugs.   Family History:  The patient's family  history includes Arthritis in her brother, father, and mother; Cancer in her brother, father, and mother; Diabetes in her sister; Heart disease in her brother, father, and mother; Hypertension in her sister and another family member; Other in her brother, brother, and sister; Stroke in her mother, sister, sister, and another family member.    ROS:  Please see the history of present illness.   Otherwise, review of systems are positive for none.   All other systems are reviewed and negative.    PHYSICAL EXAM: VS:  BP 122/56 mmHg  Pulse 67  Ht 5' (1.524 m)  Wt 146 lb 6.4 oz (66.407 kg)  BMI 28.59 kg/m2 , BMI Body mass index is 28.59 kg/(m^2). GEN: Well nourished, well developed, in no acute distress HEENT: normal Neck: no JVD, carotid bruits, or masses Cardiac: RRR; no murmurs, rubs, or gallops,no edema  Respiratory:  clear to auscultation bilaterally, normal work of breathing GI: soft, nontender, nondistended, + BS MS: no deformity or atrophy Skin: warm and dry, no rash Neuro:  Strength and sensation are intact Psych: euthymic mood, full affect   EKG:  EKG is not ordered today.     Recent Labs: 05/23/2014: ALT 22; BUN 31*; Creatinine 1.20*; Hemoglobin 12.0; Platelets 185; Potassium 4.0; Sodium 140; TSH 2.000    Lipid Panel    Component Value Date/Time   CHOL 180 05/23/2014 1000   TRIG 207* 05/23/2014 1000   HDL  45 05/23/2014 1000   CHOLHDL 4.0 05/23/2014 1000   VLDL 41* 05/23/2014 1000   LDLCALC 94 05/23/2014 1000      Wt Readings from Last 3 Encounters:  06/06/14 146 lb 6.4 oz (66.407 kg)  05/29/14 145 lb 14.4 oz (66.18 kg)  02/21/14 145 lb 9.6 oz (66.044 kg)         ASSESSMENT AND PLAN:  1. Chest pain history with normal coronary arteries in 2002 and normal Myoview stress test in April 2014. 2. normal systolic function with diastolic dysfunction by echocardiogram. 3. chronic left bundle branch block 4. past history of paroxysmal SVT 5. past history of colon cancer with functioning ileostomy 6. Past history of swallowing problems secondary to achalasia treated with endoscopic injection of Botox into the esophagus. 7. hypothyroidism  Disposition: Continue current medication. Recheck in 6 months for office visit and EKG   Current medicines are reviewed at length with the patient today.  The patient does not have concerns regarding medicines.  The following changes have been made:  no change  Labs/ tests ordered today include:  No orders of the defined types were placed in this encounter.    Disposition: Continue on same medication.  Recheck in 6 months for office visit and EKG   Signed, Darlin Coco, MD  06/06/2014 5:05 PM    [facility] [place], [place], [place]  [address] Phone: [phone]; Fax: [phone]

## 2014-06-06 NOTE — Patient Instructions (Signed)
Medication Instructions:  Your physician recommends that you continue on your current medications as directed. Please refer to the Current Medication list given to you today.  Labwork: none  Testing/Procedures: none  Follow-Up: Your physician wants you to follow-up in: 6 month ov/ekg  You will receive a reminder letter in the mail two months in advance. If you don't receive a letter, please call our office to schedule the follow-up appointment.     

## 2014-06-11 DIAGNOSIS — K56609 Unspecified intestinal obstruction, unspecified as to partial versus complete obstruction: Secondary | ICD-10-CM

## 2014-06-11 HISTORY — DX: Unspecified intestinal obstruction, unspecified as to partial versus complete obstruction: K56.609

## 2014-06-13 ENCOUNTER — Other Ambulatory Visit: Payer: Medicare Other

## 2014-06-15 ENCOUNTER — Encounter (HOSPITAL_COMMUNITY): Payer: Self-pay

## 2014-06-15 ENCOUNTER — Emergency Department (HOSPITAL_COMMUNITY): Payer: Medicare Other

## 2014-06-15 ENCOUNTER — Inpatient Hospital Stay (HOSPITAL_COMMUNITY)
Admission: EM | Admit: 2014-06-15 | Discharge: 2014-06-16 | DRG: 389 | Disposition: A | Discharge status: Home / Self Care | Payer: Medicare Other | Attending: Internal Medicine | Admitting: Internal Medicine

## 2014-06-15 DIAGNOSIS — N179 Acute kidney failure, unspecified: Secondary | ICD-10-CM | POA: Diagnosis present

## 2014-06-15 DIAGNOSIS — M1990 Unspecified osteoarthritis, unspecified site: Secondary | ICD-10-CM | POA: Diagnosis present

## 2014-06-15 DIAGNOSIS — I5032 Chronic diastolic (congestive) heart failure: Secondary | ICD-10-CM | POA: Diagnosis present

## 2014-06-15 DIAGNOSIS — Z932 Ileostomy status: Secondary | ICD-10-CM

## 2014-06-15 DIAGNOSIS — E039 Hypothyroidism, unspecified: Secondary | ICD-10-CM | POA: Diagnosis present

## 2014-06-15 DIAGNOSIS — K5669 Other intestinal obstruction: Secondary | ICD-10-CM

## 2014-06-15 DIAGNOSIS — Z806 Family history of leukemia: Secondary | ICD-10-CM

## 2014-06-15 DIAGNOSIS — K565 Intestinal adhesions [bands] with obstruction (postprocedural) (postinfection): Secondary | ICD-10-CM | POA: Diagnosis present

## 2014-06-15 DIAGNOSIS — I110 Hypertensive heart disease with heart failure: Secondary | ICD-10-CM | POA: Diagnosis present

## 2014-06-15 DIAGNOSIS — K56600 Partial intestinal obstruction, unspecified as to cause: Secondary | ICD-10-CM | POA: Diagnosis present

## 2014-06-15 DIAGNOSIS — Z8249 Family history of ischemic heart disease and other diseases of the circulatory system: Secondary | ICD-10-CM

## 2014-06-15 DIAGNOSIS — R100 Acute abdomen: Secondary | ICD-10-CM

## 2014-06-15 DIAGNOSIS — I119 Hypertensive heart disease without heart failure: Secondary | ICD-10-CM | POA: Diagnosis present

## 2014-06-15 DIAGNOSIS — E785 Hyperlipidemia, unspecified: Secondary | ICD-10-CM | POA: Diagnosis present

## 2014-06-15 DIAGNOSIS — R739 Hyperglycemia, unspecified: Secondary | ICD-10-CM | POA: Diagnosis present

## 2014-06-15 DIAGNOSIS — K219 Gastro-esophageal reflux disease without esophagitis: Secondary | ICD-10-CM | POA: Diagnosis present

## 2014-06-15 DIAGNOSIS — Z823 Family history of stroke: Secondary | ICD-10-CM | POA: Diagnosis not present

## 2014-06-15 DIAGNOSIS — Z85038 Personal history of other malignant neoplasm of large intestine: Secondary | ICD-10-CM

## 2014-06-15 DIAGNOSIS — R1110 Vomiting, unspecified: Secondary | ICD-10-CM | POA: Diagnosis not present

## 2014-06-15 DIAGNOSIS — Z87891 Personal history of nicotine dependence: Secondary | ICD-10-CM | POA: Diagnosis not present

## 2014-06-15 DIAGNOSIS — R109 Unspecified abdominal pain: Secondary | ICD-10-CM | POA: Insufficient documentation

## 2014-06-15 DIAGNOSIS — I447 Left bundle-branch block, unspecified: Secondary | ICD-10-CM | POA: Diagnosis present

## 2014-06-15 DIAGNOSIS — D500 Iron deficiency anemia secondary to blood loss (chronic): Secondary | ICD-10-CM | POA: Diagnosis present

## 2014-06-15 DIAGNOSIS — N390 Urinary tract infection, site not specified: Secondary | ICD-10-CM | POA: Diagnosis present

## 2014-06-15 DIAGNOSIS — K56609 Unspecified intestinal obstruction, unspecified as to partial versus complete obstruction: Secondary | ICD-10-CM | POA: Diagnosis present

## 2014-06-15 DIAGNOSIS — Z833 Family history of diabetes mellitus: Secondary | ICD-10-CM | POA: Diagnosis not present

## 2014-06-15 DIAGNOSIS — R1084 Generalized abdominal pain: Secondary | ICD-10-CM | POA: Diagnosis not present

## 2014-06-15 LAB — COMPREHENSIVE METABOLIC PANEL
ALBUMIN: 4.3 g/dL (ref 3.5–5.0)
ALT: 27 U/L (ref 14–54)
ANION GAP: 9 (ref 5–15)
AST: 31 U/L (ref 15–41)
Alkaline Phosphatase: 71 U/L (ref 38–126)
BUN: 20 mg/dL (ref 6–20)
CO2: 27 mmol/L (ref 22–32)
CREATININE: 1.24 mg/dL — AB (ref 0.44–1.00)
Calcium: 9.5 mg/dL (ref 8.9–10.3)
Chloride: 101 mmol/L (ref 101–111)
GFR calc Af Amer: 46 mL/min — ABNORMAL LOW (ref >60)
GFR calc non Af Amer: 39 mL/min — ABNORMAL LOW (ref >60)
GLUCOSE: 134 mg/dL — AB (ref 70–99)
Potassium: 3.8 mmol/L (ref 3.5–5.1)
SODIUM: 137 mmol/L (ref 135–145)
TOTAL PROTEIN: 8.3 g/dL — AB (ref 6.5–8.1)
Total Bilirubin: 0.7 mg/dL (ref 0.3–1.2)

## 2014-06-15 LAB — CBC WITH DIFFERENTIAL/PLATELET
Basophils Absolute: 0 10*3/uL (ref 0.0–0.1)
Basophils Relative: 0 % (ref 0–1)
EOS ABS: 0.1 10*3/uL (ref 0.0–0.7)
Eosinophils Relative: 2 % (ref 0–5)
HCT: 39.4 % (ref 36.0–46.0)
HEMOGLOBIN: 12.8 g/dL (ref 12.0–15.0)
LYMPHS ABS: 1.2 10*3/uL (ref 0.7–4.0)
Lymphocytes Relative: 14 % (ref 12–46)
MCH: 29.2 pg (ref 26.0–34.0)
MCHC: 32.5 g/dL (ref 30.0–36.0)
MCV: 90 fL (ref 78.0–100.0)
MONOS PCT: 8 % (ref 3–12)
Monocytes Absolute: 0.6 10*3/uL (ref 0.1–1.0)
Neutro Abs: 6.1 10*3/uL (ref 1.7–7.7)
Neutrophils Relative %: 76 % (ref 43–77)
Platelets: 181 10*3/uL (ref 150–400)
RBC: 4.38 MIL/uL (ref 3.87–5.11)
RDW: 13.2 % (ref 11.5–15.5)
WBC: 8.1 10*3/uL (ref 4.0–10.5)

## 2014-06-15 LAB — URINALYSIS, ROUTINE W REFLEX MICROSCOPIC
Bilirubin Urine: NEGATIVE
Glucose, UA: NEGATIVE mg/dL
Ketones, ur: NEGATIVE mg/dL
NITRITE: NEGATIVE
Protein, ur: NEGATIVE mg/dL
SPECIFIC GRAVITY, URINE: 1.025 (ref 1.005–1.030)
UROBILINOGEN UA: 0.2 mg/dL (ref 0.0–1.0)
pH: 5.5 (ref 5.0–8.0)

## 2014-06-15 LAB — TSH: TSH: 3.572 u[IU]/mL (ref 0.350–4.500)

## 2014-06-15 LAB — URINE MICROSCOPIC-ADD ON

## 2014-06-15 MED ORDER — ONDANSETRON HCL 4 MG PO TABS: 4.0000 mg | ORAL_TABLET | Freq: Four times a day (QID) | ORAL | Status: DC | PRN

## 2014-06-15 MED ORDER — ONDANSETRON HCL 4 MG/2ML IJ SOLN: 4.0000 mg | Freq: Four times a day (QID) | INTRAMUSCULAR | Status: DC | PRN

## 2014-06-15 MED ORDER — METOPROLOL TARTRATE 1 MG/ML IV SOLN
5.0000 mg | Freq: Two times a day (BID) | INTRAVENOUS | Status: DC
  Administered 2014-06-15 – 2014-06-16 (×3): 5 mg via INTRAVENOUS
  Filled 2014-06-15 (×3): qty 5

## 2014-06-15 MED ORDER — MORPHINE SULFATE 2 MG/ML IJ SOLN
1.0000 mg | INTRAMUSCULAR | Status: DC | PRN
Start: 2014-06-15 — End: 2014-06-16
  Administered 2014-06-15: 1 mg via INTRAVENOUS
  Filled 2014-06-15: qty 1

## 2014-06-15 MED ORDER — DIGOXIN 0.25 MG/ML IJ SOLN
0.1250 mg | Freq: Every day | INTRAMUSCULAR | Status: DC
  Administered 2014-06-15 – 2014-06-16 (×2): 0.125 mg via INTRAVENOUS
  Filled 2014-06-15 (×2): qty 2

## 2014-06-15 MED ORDER — ACETAMINOPHEN 650 MG RE SUPP: 650.0000 mg | Freq: Four times a day (QID) | RECTAL | Status: DC | PRN

## 2014-06-15 MED ORDER — LORAZEPAM 2 MG/ML IJ SOLN
0.5000 mg | Freq: Every evening | INTRAMUSCULAR | Status: DC | PRN
  Administered 2014-06-16: 0.5 mg via INTRAVENOUS
  Filled 2014-06-15: qty 1

## 2014-06-15 MED ORDER — PANTOPRAZOLE SODIUM 40 MG IV SOLR
40.0000 mg | Freq: Two times a day (BID) | INTRAVENOUS | Status: DC
  Administered 2014-06-15 – 2014-06-16 (×3): 40 mg via INTRAVENOUS
  Filled 2014-06-15 (×3): qty 40

## 2014-06-15 MED ORDER — ENOXAPARIN SODIUM 40 MG/0.4ML ~~LOC~~ SOLN
40.0000 mg | SUBCUTANEOUS | Status: DC
  Administered 2014-06-15: 40 mg via SUBCUTANEOUS
  Filled 2014-06-15: qty 0.4

## 2014-06-15 MED ORDER — SODIUM CHLORIDE 0.9 % IV SOLN
INTRAVENOUS | Status: DC
  Administered 2014-06-15 – 2014-06-16 (×2): via INTRAVENOUS

## 2014-06-15 MED ORDER — MORPHINE SULFATE 4 MG/ML IJ SOLN
2.0000 mg | Freq: Once | INTRAMUSCULAR | Status: AC
  Administered 2014-06-15: 2 mg via INTRAVENOUS
  Filled 2014-06-15: qty 1

## 2014-06-15 MED ORDER — LIDOCAINE VISCOUS 2 % MT SOLN
OROMUCOSAL | Status: AC
  Filled 2014-06-15: qty 15

## 2014-06-15 MED ORDER — LEVOTHYROXINE SODIUM 100 MCG IV SOLR
37.0000 ug | Freq: Every day | INTRAVENOUS | Status: DC
  Administered 2014-06-15 – 2014-06-16 (×2): 37 ug via INTRAVENOUS
  Filled 2014-06-15 (×5): qty 5

## 2014-06-15 MED ORDER — ONDANSETRON HCL 4 MG/2ML IJ SOLN
4.0000 mg | Freq: Once | INTRAMUSCULAR | Status: AC
  Administered 2014-06-15: 4 mg via INTRAVENOUS
  Filled 2014-06-15: qty 2

## 2014-06-15 MED ORDER — ACETAMINOPHEN 325 MG PO TABS: 650.0000 mg | ORAL_TABLET | Freq: Four times a day (QID) | ORAL | Status: DC | PRN

## 2014-06-15 MED ORDER — DEXTROSE 5 % IV SOLN
1.0000 g | INTRAVENOUS | Status: DC
  Administered 2014-06-15: 1 g via INTRAVENOUS
  Filled 2014-06-15 (×4): qty 10

## 2014-06-15 NOTE — H&P (Signed)
[facility] History and Physical  Karen [name] YBO:[id number] DOB: Jun 12, 1929 DOA: 06/15/2014  Referring physician: delan ED MD PCP: [name], MD   Chief Complaint: abdominal pain/nausea/  HPI: Karen [name] is a very pleasant 81 y.o. female with a past medical history of ileostomy secondary to colon cancer, hypertension, hypothyroid, GERD, IDA, bundle branch block presents to emergency Department chief complaint worsening abdominal pain and distention. Initial evaluation reveals possible partial or early small bowel obstruction. Patient reports intermittent abdominal pain over the last 3 or 4 months worsening in the last 24 hours. Pain is located in the lower quadrants described as cramp like worsening with eating. Associated symptoms include decreased volume of stool in ileostomy bag, nausea but no vomiting. Passing stool from her rectum. She denies bright red blood per rectum or stoma she denies melena. She reports she's had obstructions in the past and felt certain she was having a recurrence.  In addition she describes some dysuria and frequency. She reports taking sublingual Zofran with little relief. She denies fever chills recent travel or sick contacts. She denies chest pain palpitation shortness of breath. She denies headache visual disturbances syncope or near-syncope.   Workup in the emergency department includes Comperm and fifth metabolic panel significant for creatinine of 1.24 serum glucose of 134, CBC with differential that's unremarkable, urinalysis concerning for a UTI abdominal x-ray mildly dilated stacked small bowel loops, concerning for possible partial or early obstruction.  In the emergency department she was provided with 2 mg of morphine 4 mg of Zofran and an NG tube. At the time of my evaluation she is afebrile hemodynamically stable and not hypoxic  Dr. Aviva Signs with general surgery consulted.    Review of Systems:  And point review of systems  complete and all systems are negative except as indicated in the history of present illness   Past Medical History  Diagnosis Date  . Chronic diastolic CHF (congestive heart failure)     a. nl EF by echo 05/2009.  Marland Kitchen DVT of deep femoral vein   . Palpitations   . History of PSVT (paroxysmal supraventricular tachycardia)   . LBBB (left bundle branch block)   . Hypertension   . Abdominal adhesions   . Ileostomy present   . Colon polyp   . Chest pain     a. 2002 Cath: nl cors;  b. 2009 aden mv: nl;  c. 05/2009 Echo: nl.  . Wears dentures   . Breast lump   . Hyperlipidemia   . Thyroid disease   . Anemia   . History of blood clots   . Hernia   . Blood transfusion   . Hearing loss   . Arthritis   . GERD (gastroesophageal reflux disease)   . Cancer     bladder  . Colon cancer     x 2. Last 2010  . Iron deficiency anemia due to chronic blood loss 07/06/2013   Past Surgical History  Procedure Laterality Date  . Cardiac catheterization  12/10/2000    THE LEFT VENTRICLE IS MILDY DILATED. THERE IS MILD TO MODERATE DIFFUSE HYPOKINESIS WITH EF 35%  . Colon cancer surgery    . Ileostomy    . Esophagogastroduodenoscopy  02/13/2011    Procedure: ESOPHAGOGASTRODUODENOSCOPY (EGD);  Surgeon: Rogene Houston, MD;  [facility];  Service: Endoscopy;  Laterality: N/A;  1030  . Colonoscopy  09/26/2011    Procedure: COLONOSCOPY;  Surgeon: Rogene Houston, MD;  [facility];  Service: Endoscopy;  Laterality: N/A;  215  . Cataract extraction w/phaco  11/13/2011    Procedure: CATARACT EXTRACTION PHACO AND INTRAOCULAR LENS PLACEMENT (IOC);  Surgeon: Tonny Branch, MD;  Location: [facility];  Service: Ophthalmology;  Laterality: Right;  CDE 12.26  . Cataract extraction w/phaco  12/08/2011    Procedure: CATARACT EXTRACTION PHACO AND INTRAOCULAR LENS PLACEMENT (IOC);  Surgeon: Tonny Branch, MD;  Location: [facility];  Service: Ophthalmology;  Laterality: Left;  CDE 15.11  . Esophagogastroduodenoscopy  N/A 09/01/2013    Procedure: ESOPHAGOGASTRODUODENOSCOPY (EGD);  Surgeon: Rogene Houston, MD;  [facility];  Service: Endoscopy;  Laterality: N/A;  830  . Botox injection N/A 09/01/2013    Procedure: BOTOX INJECTION;  Surgeon: Rogene Houston, MD;  [facility];  Service: Endoscopy;  Laterality: N/A;   Social History:  reports that she quit smoking about 51 years ago. She has never used smokeless tobacco. She reports that she does not drink alcohol or use illicit drugs.  She lives at home with her husband she is independent with ADLs Allergies  Allergen Reactions  . Bactrim [Sulfamethoxazole-Trimethoprim] Shortness Of Breath and Other (See Comments)    Hurting all over  . Lidocaine Hcl Other (See Comments)    tachycardia  . Lisinopril Cough  . Nitrofurantoin Monohyd Macro Other (See Comments)    Unknown   . Ramipril [Ramipril] Cough  . Codeine Palpitations and Other (See Comments)    Heart races  . Demerol Palpitations and Other (See Comments)    tachycardia  . Morphine And Related Palpitations    Tachycardia     Family History  Problem Relation Age of Onset  . Stroke    . Hypertension    . Heart disease Mother   . Stroke Mother     deceased  . Cancer Mother     bladder  . Arthritis Mother   . Arthritis Father   . Heart disease Father     decesaed  . Cancer Father     leukemia  . Stroke Sister     alive/debilitated  . Hypertension Sister   . Other Sister     paralysis  . Heart disease Brother     bypass surgery  . Cancer Brother   . Arthritis Brother   . Stroke Sister     alive/debilitated  . Diabetes Sister   . Other Brother     stomach problems  . Other Brother     bladder    Prior to Admission medications   Medication Sig Start Date End Date Taking? Authorizing Provider  ALPRAZolam Duanne Moron) 0.5 MG tablet Take 0.5 mg by mouth at bedtime.    Yes Historical Provider, MD  amitriptyline (ELAVIL) 10 MG tablet Take 20 mg by mouth at  bedtime.    Yes Historical Provider, MD  aspirin EC 325 MG tablet Take 325 mg by mouth daily.   Yes Historical Provider, MD  carvedilol (COREG) 12.5 MG tablet Take 1 tablet (12.5 mg total) by mouth 2 (two) times daily with a meal. 12/02/13  Yes Darlin Coco, MD  digoxin (LANOXIN) 0.125 MG tablet TAKE ONE TABLET BY MOUTH ONCE DAILY 12/02/13  Yes Darlin Coco, MD  HYDROcodone-acetaminophen (NORCO/VICODIN) 5-325 MG per tablet Take 1 tablet by mouth every 4 (four) hours as needed for moderate pain.   Yes Historical Provider, MD  hyoscyamine (LEVSIN SL) 0.125 MG SL tablet Place 0.125 mg under the tongue every 4 (four) hours as needed (lower  abdominal pain).   Yes Historical Provider, MD  levothyroxine (SYNTHROID, LEVOTHROID) 75 MCG tablet Take 75 mcg by mouth daily.    Yes Historical Provider, MD  losartan-hydrochlorothiazide (HYZAAR) 100-12.5 MG per tablet Take 1 tablet by mouth daily. 06/01/12  Yes Darlin Coco, MD  Multiple Vitamins-Minerals (EQL GUMMY ADULT) CHEW Chew 2 tablets by mouth daily.    Yes Historical Provider, MD  NITROSTAT 0.4 MG SL tablet DISSOLVE ONE TABLET UNDER THE TONGUE EVERY 5 MINUTES AS NEEDED FOR CHEST PAIN.  DO NOT EXCEED A TOTAL OF 3 DOSES IN 15 MINUTES 11/15/13  Yes Darlin Coco, MD  Omega-3 Fatty Acids (FISH OIL) 1200 MG CAPS Take 1,200 mg by mouth daily.   Yes Historical Provider, MD  ondansetron (ZOFRAN-ODT) 4 MG disintegrating tablet Take 1 tablet (4 mg total) by mouth every 8 (eight) hours as needed for nausea or vomiting. 07/11/13  Yes Davonna Belling, MD  pantoprazole (PROTONIX) 40 MG tablet Take 40 mg by mouth daily.    Yes Historical Provider, MD  bifidobacterium infantis (ALIGN) capsule Take 1 capsule by mouth daily. 08/22/13   Rogene Houston, MD  ketoconazole (NIZORAL) 2 % cream Apply 1 application topically 2 (two) times daily. Apply to feet twice daily 06/01/14   Historical Provider, MD  mesalamine (CANASA) 1000 MG suppository Place 1 suppository (1,000  mg total) rectally at bedtime. 02/21/14   Rogene Houston, MD   Physical Exam: Filed Vitals:   06/15/14 0500 06/15/14 3267 06/15/14 0716 06/15/14 0925  BP: 149/63 135/63 135/63 135/66  Pulse: 74 79 85 80  Temp:  97.5 F (36.4 C) 98.1 F (36.7 C) 98.6 F (37 C)  TempSrc:  Oral  Oral  Resp:  16 18 18   SpO2: 97% 95% 95% 98%    Wt Readings from Last 3 Encounters:  06/06/14 66.407 kg (146 lb 6.4 oz)  05/29/14 66.18 kg (145 lb 14.4 oz)  02/21/14 66.044 kg (145 lb 9.6 oz)    General:  Appears calm and comfortable Eyes: PERRL, normal lids, irises & conjunctiva ENT: grossly normal hearing, lips & tongue his membranes of her mouth pink slightly dry  Neck: no LAD, masses or thyromegaly Cardiovascular: RRR, no m/r/g. No LE edema. Telemetry: SR, no arrhythmias  Respiratory: CTA bilaterally, no w/r/r. Normal respiratory effort. Abdomen: slightly distended but soft positive bowel sounds diffuse tenderness to mild palpation ileostomy bag with small amount of thin brown drainage stoma pink, NG intact draining small amount of greenish drainage Skin: no rash or induration seen on limited exam Musculoskeletal: grossly normal tone BUE/BLE  Psychiatric: grossly normal mood and affect, speech fluent and appropriate Neurologic: grossly non-focal.          Labs on Admission:  Basic Metabolic Panel:  Recent Labs Lab 06/15/14 0355  NA 137  K 3.8  CL 101  CO2 27  GLUCOSE 134*  BUN 20  CREATININE 1.24*  CALCIUM 9.5   Liver Function Tests:  Recent Labs Lab 06/15/14 0355  AST 31  ALT 27  ALKPHOS 71  BILITOT 0.7  PROT 8.3*  ALBUMIN 4.3   No results for input(s): LIPASE, AMYLASE in the last 168 hours. No results for input(s): AMMONIA in the last 168 hours. CBC:  Recent Labs Lab 06/15/14 0355  WBC 8.1  NEUTROABS 6.1  HGB 12.8  HCT 39.4  MCV 90.0  PLT 181   Cardiac Enzymes: No results for input(s): CKTOTAL, CKMB, CKMBINDEX, TROPONINI in the last 168 hours.  BNP (last 3  results)  No results for input(s): BNP in the last 8760 hours.  ProBNP (last 3 results) No results for input(s): PROBNP in the last 8760 hours.  CBG: No results for input(s): GLUCAP in the last 168 hours.  Radiological Exams on Admission: Dg Abd Acute W/chest  06/15/2014   CLINICAL DATA:  Pain and vomiting starting at 21:30  EXAM: DG ABDOMEN ACUTE W/ 1V CHEST  COMPARISON:  07/11/2013  FINDINGS: There are mildly dilated and stacked small bowel loops in the mid abdomen, concerning for possible partial or early obstruction. There is stool and air throughout the colon to the rectum. There is no free intraperitoneal air. The upright view of the chest demonstrates mild unchanged cardiomegaly but no acute cardiopulmonary findings.  IMPRESSION: Mildly dilated stacked small bowel loops, concerning for possible partial or early obstruction.   Electronically Signed   By: Andreas Newport M.D.   On: 06/15/2014 04:32    EKG:   Assessment/Plan Principal Problem:   Small bowel obstruction, partial: In patient status post ileostomy 6 years ago for colon cancer. Informed by x-ray. Some improvement since NG tube placed. Continue bowel rest and supportive therapy. A weighted by general surgery who opined obstruction likely related to adhesive disease. Appreciate general surgery assistance. Of note chart review indicates a Rosenbower in [place] has informed patient she would not tolerate further abdominal surgery. niothing  by mouth. Active Problems:  Acute renal failure: Likely related to decreased by mouth intake. Also may be a component of some chronic disease. Hold nephrotoxins gently hydrate monitor urine output recheck in the morning.     UTI (urinary tract infection): Analysis consistent with clinical presentation as well. Rocephin. Obtain culture. Currently she is afebrile nontoxic appearing. Will monitor urine output.  Acute abdominal pain: Related to #1. See therapy is noted in #1. Analgesics as  needed.     Hyperglycemia: No history of diabetes: Check hemoglobin A1c. Monitor CBG plus sliding-scale insulin as indicated    Left bundle branch block: Per chart review very recent visit with Dr. Mare Ferrari. History of normal coronary arteries in 2002 and normal Myoview stress test in 6803, normal systolic function with diastolic dysfunction by echocardiogram, chronic left bundle branch block. He said no chest pain. Will convert beta blocker and dig to IV and continue.    Benign hypertensive heart disease without heart failure: Stable. Will continue beta blocker IV. Monitor closely. Provide when necessary hydralazine as indicated    Hypothyroid: Recent TSH 2.0. Will continue Synthroid    Iron deficiency anemia due to chronic blood loss: hg 12.8. No s/sx bleeding       Dr Arnoldo Morale general surgery   Code Status: full DVT Prophylaxis: Family Communication: none present Disposition Plan: home 2-3 days  Time spent: 50 minutes  [facility] Pager [phone]

## 2014-06-15 NOTE — Progress Notes (Addendum)
ANTIBIOTIC CONSULT NOTE - INITIAL  Pharmacy Consult for Rocephin Indication: UTI  Allergies  Allergen Reactions  . Bactrim [Sulfamethoxazole-Trimethoprim] Shortness Of Breath and Other (See Comments)    Hurting all over  . Lidocaine Hcl Other (See Comments)    tachycardia  . Lisinopril Cough  . Nitrofurantoin Monohyd Macro Other (See Comments)    Unknown   . Ramipril [Ramipril] Cough  . Codeine Palpitations and Other (See Comments)    Heart races  . Demerol Palpitations and Other (See Comments)    tachycardia  . Morphine And Related Palpitations    Tachycardia    Patient Measurements: Height: 5' (152.4 cm) Weight: 148 lb 12.8 oz (67.495 kg) IBW/kg (Calculated) : 45.5  Vital Signs: Temp: 98.6 F (37 C) (05/05 0925) Temp Source: Oral (05/05 0925) BP: 135/66 mmHg (05/05 0925) Pulse Rate: 80 (05/05 0925) Intake/Output from previous day: 05/04 0701 - 05/05 0700 In: -  Out: 700 [Stool:700] Intake/Output from this shift: Total I/O In: -  Out: 100 [Stool:100]  Labs:  Recent Labs  06/15/14 0355  WBC 8.1  HGB 12.8  PLT 181  CREATININE 1.24*   Estimated Creatinine Clearance: 30 mL/min (by C-G formula based on Cr of 1.24). No results for input(s): VANCOTROUGH, VANCOPEAK, VANCORANDOM, GENTTROUGH, GENTPEAK, GENTRANDOM, TOBRATROUGH, TOBRAPEAK, TOBRARND, AMIKACINPEAK, AMIKACINTROU, AMIKACIN in the last 72 hours.   Microbiology: No results found for this or any previous visit (from the past 720 hour(s)).  Medical History: Past Medical History  Diagnosis Date  . Chronic diastolic CHF (congestive heart failure)     a. nl EF by echo 05/2009.  Marland Kitchen DVT of deep femoral vein   . Palpitations   . History of PSVT (paroxysmal supraventricular tachycardia)   . LBBB (left bundle branch block)   . Hypertension   . Abdominal adhesions   . Ileostomy present   . Colon polyp   . Chest pain     a. 2002 Cath: nl cors;  b. 2009 aden mv: nl;  c. 05/2009 Echo: nl.  . Wears dentures    . Breast lump   . Hyperlipidemia   . Thyroid disease   . Anemia   . History of blood clots   . Hernia   . Blood transfusion   . Hearing loss   . Arthritis   . GERD (gastroesophageal reflux disease)   . Cancer     bladder  . Colon cancer     x 2. Last 2010  . Iron deficiency anemia due to chronic blood loss 07/06/2013   Anti-infectives    None     Assessment: 81yo female with h/o colon ca, HTN, GERD.  Pt admitted with SBO and suspected UTI.  Asked to initiate Rocephin.   Goal of Therapy:  Eradicate infection.  Plan:  Rocephin 1gm IV q24hrs Monitor labs, progress, and cultures  Hart Robinsons A 06/15/2014,1:04 PM   Addendum: No dose adjustments anticipated.  Pharmacy to sign off.  Please re-consult if needed.  Netta Cedars, PharmD, BCPS 06/16/2014@11 :59 AM

## 2014-06-15 NOTE — Consult Note (Signed)
Reason for Consult: Decreased ileostomy output Referring Physician: Hospitalist  Karen [name] is an 81 y.o. female.  HPI: Patient is an 81 year old white female with multiple medical problems, status post ileostomy placement 6 years ago for colon cancer who presents with decreased ileostomy output and abdominal distention. She feels like she needs to throw up but has not. A KUB done in the emergency room reveals multiple dilated small bowel loops. Despite some ileostomy output, it was elected to proceed with admission and NG tube decompression. She has been followed by Dr. Zella Richer in [place] for a parastomal hernia. He is told the patient that she would not tolerate further abdominal surgery.  Past Medical History  Diagnosis Date  . Chronic diastolic CHF (congestive heart failure)     a. nl EF by echo 05/2009.  Marland Kitchen DVT of deep femoral vein   . Palpitations   . History of PSVT (paroxysmal supraventricular tachycardia)   . LBBB (left bundle branch block)   . Hypertension   . Abdominal adhesions   . Ileostomy present   . Colon polyp   . Chest pain     a. 2002 Cath: nl cors;  b. 2009 aden mv: nl;  c. 05/2009 Echo: nl.  . Wears dentures   . Breast lump   . Hyperlipidemia   . Thyroid disease   . Anemia   . History of blood clots   . Hernia   . Blood transfusion   . Hearing loss   . Arthritis   . GERD (gastroesophageal reflux disease)   . Cancer     bladder  . Colon cancer     x 2. Last 2010  . Iron deficiency anemia due to chronic blood loss 07/06/2013    Past Surgical History  Procedure Laterality Date  . Cardiac catheterization  12/10/2000    THE LEFT VENTRICLE IS MILDY DILATED. THERE IS MILD TO MODERATE DIFFUSE HYPOKINESIS WITH EF 35%  . Colon cancer surgery    . Ileostomy    . Esophagogastroduodenoscopy  02/13/2011    Procedure: ESOPHAGOGASTRODUODENOSCOPY (EGD);  Surgeon: Rogene Houston, MD;  [facility]: [facility];  Service: Endoscopy;  Laterality: N/A;  1030  .  Colonoscopy  09/26/2011    Procedure: COLONOSCOPY;  Surgeon: Rogene Houston, MD;  Location: [facility];  Service: Endoscopy;  Laterality: N/A;  215  . Cataract extraction w/phaco  11/13/2011    Procedure: CATARACT EXTRACTION PHACO AND INTRAOCULAR LENS PLACEMENT (IOC);  Surgeon: Tonny Branch, MD;  Location: [facility];  Service: Ophthalmology;  Laterality: Right;  CDE 12.26  . Cataract extraction w/phaco  12/08/2011    Procedure: CATARACT EXTRACTION PHACO AND INTRAOCULAR LENS PLACEMENT (IOC);  Surgeon: Tonny Branch, MD;  Location: [facility];  Service: Ophthalmology;  Laterality: Left;  CDE 15.11  . Esophagogastroduodenoscopy N/A 09/01/2013    Procedure: ESOPHAGOGASTRODUODENOSCOPY (EGD);  Surgeon: Rogene Houston, MD;  [facility]: [facility];  Service: Endoscopy;  Laterality: N/A;  830  . Botox injection N/A 09/01/2013    Procedure: BOTOX INJECTION;  Surgeon: Rogene Houston, MD;  Location: [facility];  Service: Endoscopy;  Laterality: N/A;    Family History  Problem Relation Age of Onset  . Stroke    . Hypertension    . Heart disease Mother   . Stroke Mother     deceased  . Cancer Mother     bladder  . Arthritis Mother   . Arthritis Father   . Heart disease Father  decesaed  . Cancer Father     leukemia  . Stroke Sister     alive/debilitated  . Hypertension Sister   . Other Sister     paralysis  . Heart disease Brother     bypass surgery  . Cancer Brother   . Arthritis Brother   . Stroke Sister     alive/debilitated  . Diabetes Sister   . Other Brother     stomach problems  . Other Brother     bladder    Social History:  reports that she quit smoking about 51 years ago. She has never used smokeless tobacco. She reports that she does not drink alcohol or use illicit drugs.  Allergies:  Allergies  Allergen Reactions  . Bactrim [Sulfamethoxazole-Trimethoprim] Shortness Of Breath and Other (See Comments)    Hurting all over  . Lidocaine Hcl Other (See Comments)     tachycardia  . Lisinopril Cough  . Nitrofurantoin Monohyd Macro Other (See Comments)    Unknown   . Ramipril [Ramipril] Cough  . Codeine Palpitations and Other (See Comments)    Heart races  . Demerol Palpitations and Other (See Comments)    tachycardia  . Morphine And Related Palpitations    Tachycardia     Medications: I have reviewed the patient's current medications.  Results for orders placed or performed during the hospital encounter of 06/15/14 (from the past 48 hour(s))  Comprehensive metabolic panel     Status: Abnormal   Collection Time: 06/15/14  3:55 AM  Result Value Ref Range   Sodium 137 135 - 145 mmol/L   Potassium 3.8 3.5 - 5.1 mmol/L   Chloride 101 101 - 111 mmol/L   CO2 27 22 - 32 mmol/L   Glucose, Bld 134 (H) 70 - 99 mg/dL   BUN 20 6 - 20 mg/dL   Creatinine, Ser 1.24 (H) 0.44 - 1.00 mg/dL   Calcium 9.5 8.9 - 10.3 mg/dL   Total Protein 8.3 (H) 6.5 - 8.1 g/dL   Albumin 4.3 3.5 - 5.0 g/dL   AST 31 15 - 41 U/L   ALT 27 14 - 54 U/L   Alkaline Phosphatase 71 38 - 126 U/L   Total Bilirubin 0.7 0.3 - 1.2 mg/dL   GFR calc non Af Amer 39 (L) >60 mL/min   GFR calc Af Amer 46 (L) >60 mL/min    Comment: (NOTE) The eGFR has been calculated using the CKD EPI equation. This calculation has not been validated in all clinical situations. eGFR's persistently <90 mL/min signify possible Chronic Kidney Disease.    Anion gap 9 5 - 15  CBC with Differential     Status: None   Collection Time: 06/15/14  3:55 AM  Result Value Ref Range   WBC 8.1 4.0 - 10.5 K/uL   RBC 4.38 3.87 - 5.11 MIL/uL   Hemoglobin 12.8 12.0 - 15.0 g/dL   HCT 39.4 36.0 - 46.0 %   MCV 90.0 78.0 - 100.0 fL   MCH 29.2 26.0 - 34.0 pg   MCHC 32.5 30.0 - 36.0 g/dL   RDW 13.2 11.5 - 15.5 %   Platelets 181 150 - 400 K/uL   Neutrophils Relative % 76 43 - 77 %   Neutro Abs 6.1 1.7 - 7.7 K/uL   Lymphocytes Relative 14 12 - 46 %   Lymphs Abs 1.2 0.7 - 4.0 K/uL   Monocytes Relative 8 3 - 12 %    Monocytes Absolute 0.6 0.1 - 1.0  K/uL   Eosinophils Relative 2 0 - 5 %   Eosinophils Absolute 0.1 0.0 - 0.7 K/uL   Basophils Relative 0 0 - 1 %   Basophils Absolute 0.0 0.0 - 0.1 K/uL    Dg Abd Acute W/chest  06/15/2014   CLINICAL DATA:  Pain and vomiting starting at 21:30  EXAM: DG ABDOMEN ACUTE W/ 1V CHEST  COMPARISON:  07/11/2013  FINDINGS: There are mildly dilated and stacked small bowel loops in the mid abdomen, concerning for possible partial or early obstruction. There is stool and air throughout the colon to the rectum. There is no free intraperitoneal air. The upright view of the chest demonstrates mild unchanged cardiomegaly but no acute cardiopulmonary findings.  IMPRESSION: Mildly dilated stacked small bowel loops, concerning for possible partial or early obstruction.   Electronically Signed   By: Andreas Newport M.D.   On: 06/15/2014 04:32    ROS: See chart Blood pressure 135/63, pulse 85, temperature 98.1 F (36.7 C), temperature source Oral, resp. rate 18, SpO2 95 %. Physical Exam: Pleasant white female in no acute distress. NG tube in place. Abdomen is distended but soft. The parastomal hernia as well as an incisional hernia are present. Both are easily reducible. She has a large chevron incision as well as another midline incision. There is fluid and some gas within the ileostomy bag. Bowel sounds are present. No rigidity is noted. Ileostomy is pink.  Assessment/Plan: Impression: Partial small bowel obstruction secondary to adhesive disease, parastomal hernia does not appear to be a factor. Plan: Continue NG tube decompression. I think this is partial. Obviously will avoid any type of surgical intervention. Will follow with you.  Lawerence Dery A 06/15/2014, 8:21 AM

## 2014-06-15 NOTE — ED Provider Notes (Signed)
CSN: [id number]     Arrival date & time 06/15/14  2409 History   First MD Initiated Contact with Patient 06/15/14 [phone]     Chief Complaint  Patient presents with  . Abdominal Pain     (Consider location/radiation/quality/duration/timing/severity/associated sxs/prior Treatment) HPI Comments: Patient is an 81 year old female with history of ileostomy secondary to colon cancer. She presents for evaluation of abdominal pain, nausea, and vomiting that started several hours prior to presentation. She has a history of recurrent bowel obstructions and states this feels the same. She denies any fevers or chills. She reports having no output in her ileostomy since the onset of her discomfort.  Patient is a 81 y.o. female presenting with abdominal pain. The history is provided by the patient.  Abdominal Pain Pain location:  Generalized Pain quality: cramping   Pain radiates to:  Does not radiate Pain severity:  Moderate Onset quality:  Sudden Duration:  2 hours Timing:  Constant Progression:  Worsening Chronicity:  New Relieved by:  Nothing Worsened by:  Nothing tried Ineffective treatments:  None tried Associated symptoms: no chills, no fever, no hematemesis, no hematochezia and no shortness of breath     Past Medical History  Diagnosis Date  . Chronic diastolic CHF (congestive heart failure)     a. nl EF by echo 05/2009.  Marland Kitchen DVT of deep femoral vein   . Palpitations   . History of PSVT (paroxysmal supraventricular tachycardia)   . LBBB (left bundle branch block)   . Hypertension   . Abdominal adhesions   . Ileostomy present   . Colon polyp   . Chest pain     a. 2002 Cath: nl cors;  b. 2009 aden mv: nl;  c. 05/2009 Echo: nl.  . Wears dentures   . Breast lump   . Hyperlipidemia   . Thyroid disease   . Anemia   . History of blood clots   . Hernia   . Blood transfusion   . Hearing loss   . Arthritis   . GERD (gastroesophageal reflux disease)   . Cancer     bladder  . Colon  cancer     x 2. Last 2010  . Iron deficiency anemia due to chronic blood loss 07/06/2013   Past Surgical History  Procedure Laterality Date  . Cardiac catheterization  12/10/2000    THE LEFT VENTRICLE IS MILDY DILATED. THERE IS MILD TO MODERATE DIFFUSE HYPOKINESIS WITH EF 35%  . Colon cancer surgery    . Ileostomy    . Esophagogastroduodenoscopy  02/13/2011    Procedure: ESOPHAGOGASTRODUODENOSCOPY (EGD);  Surgeon: Rogene Houston, MD;  [facility]: [facility];  Service: Endoscopy;  Laterality: N/A;  1030  . Colonoscopy  09/26/2011    Procedure: COLONOSCOPY;  Surgeon: Rogene Houston, MD;  [facility]: [facility];  Service: Endoscopy;  Laterality: N/A;  215  . Cataract extraction w/phaco  11/13/2011    Procedure: CATARACT EXTRACTION PHACO AND INTRAOCULAR LENS PLACEMENT (IOC);  Surgeon: Tonny Branch, MD;  Location: [facility];  Service: Ophthalmology;  Laterality: Right;  CDE 12.26  . Cataract extraction w/phaco  12/08/2011    Procedure: CATARACT EXTRACTION PHACO AND INTRAOCULAR LENS PLACEMENT (IOC);  Surgeon: Tonny Branch, MD;  Location: [facility];  Service: Ophthalmology;  Laterality: Left;  CDE 15.11  . Esophagogastroduodenoscopy N/A 09/01/2013    Procedure: ESOPHAGOGASTRODUODENOSCOPY (EGD);  Surgeon: Rogene Houston, MD;  [facility]: [facility];  Service: Endoscopy;  Laterality: N/A;  830  . Botox injection N/A  09/01/2013    Procedure: BOTOX INJECTION;  Surgeon: Rogene Houston, MD;  [facility]: [facility];  Service: Endoscopy;  Laterality: N/A;   Family History  Problem Relation Age of Onset  . Stroke    . Hypertension    . Heart disease Mother   . Stroke Mother     deceased  . Cancer Mother     bladder  . Arthritis Mother   . Arthritis Father   . Heart disease Father     decesaed  . Cancer Father     leukemia  . Stroke Sister     alive/debilitated  . Hypertension Sister   . Other Sister     paralysis  . Heart disease Brother     bypass surgery  . Cancer Brother   . Arthritis Brother    . Stroke Sister     alive/debilitated  . Diabetes Sister   . Other Brother     stomach problems  . Other Brother     bladder   History  Substance Use Topics  . Smoking status: Former Smoker    Quit date: 02/11/1963  . Smokeless tobacco: Never Used  . Alcohol Use: No   OB History    No data available     Review of Systems  Constitutional: Negative for fever and chills.  Respiratory: Negative for shortness of breath.   Gastrointestinal: Positive for abdominal pain. Negative for hematochezia and hematemesis.  All other systems reviewed and are negative.     Allergies  Bactrim; Lidocaine hcl; Lisinopril; Nitrofurantoin monohyd macro; Ramipril; Codeine; Demerol; and Morphine and related  Home Medications   Prior to Admission medications   Medication Sig Start Date End Date Taking? Authorizing Provider  ALPRAZolam Duanne Moron) 0.5 MG tablet Take 0.5 mg by mouth at bedtime.    Yes Historical Provider, MD  amitriptyline (ELAVIL) 10 MG tablet Take 20 mg by mouth at bedtime.    Yes Historical Provider, MD  aspirin EC 325 MG tablet Take 325 mg by mouth daily.   Yes Historical Provider, MD  carvedilol (COREG) 12.5 MG tablet Take 1 tablet (12.5 mg total) by mouth 2 (two) times daily with a meal. 12/02/13  Yes Darlin Coco, MD  digoxin (LANOXIN) 0.125 MG tablet TAKE ONE TABLET BY MOUTH ONCE DAILY 12/02/13  Yes Darlin Coco, MD  HYDROcodone-acetaminophen (NORCO/VICODIN) 5-325 MG per tablet Take 1 tablet by mouth every 4 (four) hours as needed for moderate pain.   Yes Historical Provider, MD  hyoscyamine (LEVSIN SL) 0.125 MG SL tablet Place 0.125 mg under the tongue every 4 (four) hours as needed (lower abdominal pain).   Yes Historical Provider, MD  levothyroxine (SYNTHROID, LEVOTHROID) 75 MCG tablet Take 75 mcg by mouth daily.    Yes Historical Provider, MD  losartan-hydrochlorothiazide (HYZAAR) 100-12.5 MG per tablet Take 1 tablet by mouth daily. 06/01/12  Yes Darlin Coco, MD   mesalamine (CANASA) 1000 MG suppository Place 1 suppository (1,000 mg total) rectally at bedtime. 02/21/14  Yes Rogene Houston, MD  Multiple Vitamins-Minerals (EQL GUMMY ADULT) CHEW Chew 2 tablets by mouth daily.    Yes Historical Provider, MD  NITROSTAT 0.4 MG SL tablet DISSOLVE ONE TABLET UNDER THE TONGUE EVERY 5 MINUTES AS NEEDED FOR CHEST PAIN.  DO NOT EXCEED A TOTAL OF 3 DOSES IN 15 MINUTES 11/15/13  Yes Darlin Coco, MD  Omega-3 Fatty Acids (FISH OIL) 1200 MG CAPS Take 1,200 mg by mouth daily.   Yes Historical Provider, MD  ondansetron (ZOFRAN-ODT)  4 MG disintegrating tablet Take 1 tablet (4 mg total) by mouth every 8 (eight) hours as needed for nausea or vomiting. 07/11/13  Yes Davonna Belling, MD  pantoprazole (PROTONIX) 40 MG tablet Take 40 mg by mouth daily.    Yes Historical Provider, MD  bifidobacterium infantis (ALIGN) capsule Take 1 capsule by mouth daily. 08/22/13   Rogene Houston, MD  ketoconazole (NIZORAL) 2 % cream Apply 1 application topically 2 (two) times daily. Apply to feet twice daily 06/01/14   Historical Provider, MD   BP 169/72 mmHg  Pulse 69  Temp(Src) 98.2 F (36.8 C) (Oral)  Resp 16  SpO2 99% Physical Exam  Constitutional: She is oriented to person, place, and time. She appears well-developed and well-nourished. No distress.  HENT:  Head: Normocephalic and atraumatic.  Neck: Normal range of motion. Neck supple.  Cardiovascular: Normal rate and regular rhythm.  Exam reveals no gallop and no friction rub.   No murmur heard. Pulmonary/Chest: Effort normal and breath sounds normal. No respiratory distress. She has no wheezes.  Abdominal: Soft. Bowel sounds are normal. She exhibits distension. She exhibits no mass. There is tenderness. There is no rebound and no guarding.  There is tenderness to palpation and mild distention of the upper abdomen.  Musculoskeletal: Normal range of motion.  Neurological: She is alert and oriented to person, place, and time.  Skin:  Skin is warm and dry. She is not diaphoretic.  Nursing note and vitals reviewed.   ED Course  Procedures (including critical care time) Labs Review Labs Reviewed  COMPREHENSIVE METABOLIC PANEL  CBC WITH DIFFERENTIAL/PLATELET    Imaging Review No results found.   EKG Interpretation None      MDM   Final diagnoses:  Abdominal pain, acute    Workup reveals no elevation of white count, however does show dilated stacked small bowel loops concerning for bowel obstruction. While in the emergency department she did have approximately 300 mL of liquid stool in her ostomy, however continues with cramping. An NG tube will be placed and the patient will be admitted for an apparent small bowel obstruction. I've spoken with Dr. Arnoldo Morale who will consult on the patient and she will be admitted to the hospitalist service.    Veryl Speak, MD 06/15/14 [phone]

## 2014-06-15 NOTE — ED Notes (Signed)
Pt has history of small bowel obstruction, has an ileostomy, states she started having pain and vomiting approx 2130.

## 2014-06-16 DIAGNOSIS — N390 Urinary tract infection, site not specified: Secondary | ICD-10-CM

## 2014-06-16 LAB — HEMOGLOBIN A1C
Hgb A1c MFr Bld: 6.5 % — ABNORMAL HIGH (ref 4.8–5.6)
MEAN PLASMA GLUCOSE: 140 mg/dL

## 2014-06-16 LAB — BASIC METABOLIC PANEL
Anion gap: 9 (ref 5–15)
BUN: 21 mg/dL — ABNORMAL HIGH (ref 6–20)
CO2: 29 mmol/L (ref 22–32)
Calcium: 8.9 mg/dL (ref 8.9–10.3)
Chloride: 104 mmol/L (ref 101–111)
Creatinine, Ser: 1.06 mg/dL — ABNORMAL HIGH (ref 0.44–1.00)
GFR calc Af Amer: 55 mL/min — ABNORMAL LOW (ref >60)
GFR calc non Af Amer: 48 mL/min — ABNORMAL LOW (ref >60)
GLUCOSE: 108 mg/dL — AB (ref 70–99)
Potassium: 3.6 mmol/L (ref 3.5–5.1)
Sodium: 142 mmol/L (ref 135–145)

## 2014-06-16 MED ORDER — CIPROFLOXACIN HCL 500 MG PO TABS: 500.0000 mg | ORAL_TABLET | Freq: Two times a day (BID) | ORAL | Status: DC

## 2014-06-16 NOTE — Progress Notes (Signed)
Patient and family received discharge instructions and medication education. Patient received written prescription for Cipro. Pt. Stated she had no further questions. Escorted via W/C to main entrance by Chartered certified accountant. Pt. Stable at discharge.

## 2014-06-16 NOTE — Progress Notes (Signed)
  Subjective: Has had significant ileostomy output. Her NG tube is oriented been removed. She is tolerating clears.  Objective: Vital signs in last 24 hours: Temp:  [98.3 F (36.8 C)-98.6 F (37 C)] 98.6 F (37 C) (05/06 0651) Pulse Rate:  [73-86] 73 (05/06 0651) Resp:  [17-18] 17 (05/06 0651) BP: (134-150)/(53-61) 150/61 mmHg (05/06 0651) SpO2:  [95 %-96 %] 96 % (05/06 0651) Last BM Date: 06/15/14  Intake/Output from previous day: 05/05 0701 - 05/06 0700 In: 2047.5 [P.O.:840; I.V.:1207.5] Out: 900 [Urine:575; Stool:325] Intake/Output this shift:    General appearance: alert, cooperative and no distress GI: Soft. Ileostomy is full of fluid and gas. No tenderness is noted.  Lab Results:   Recent Labs  06/15/14 0355  WBC 8.1  HGB 12.8  HCT 39.4  PLT 181   BMET  Recent Labs  06/15/14 0355 06/16/14 0659  NA 137 142  K 3.8 3.6  CL 101 104  CO2 27 29  GLUCOSE 134* 108*  BUN 20 21*  CREATININE 1.24* 1.06*  CALCIUM 9.5 8.9   PT/INR No results for input(s): LABPROT, INR in the last 72 hours.  Studies/Results: Dg Abd Acute W/chest  06/15/2014   CLINICAL DATA:  Pain and vomiting starting at 21:30  EXAM: DG ABDOMEN ACUTE W/ 1V CHEST  COMPARISON:  07/11/2013  FINDINGS: There are mildly dilated and stacked small bowel loops in the mid abdomen, concerning for possible partial or early obstruction. There is stool and air throughout the colon to the rectum. There is no free intraperitoneal air. The upright view of the chest demonstrates mild unchanged cardiomegaly but no acute cardiopulmonary findings.  IMPRESSION: Mildly dilated stacked small bowel loops, concerning for possible partial or early obstruction.   Electronically Signed   By: Andreas Newport M.D.   On: 06/15/2014 04:32    Anti-infectives: Anti-infectives    Start     Dose/Rate Route Frequency Ordered Stop   06/15/14 1500  cefTRIAXone (ROCEPHIN) 1 g in dextrose 5 % 50 mL IVPB     1 g 100 mL/hr over 30  Minutes Intravenous Every 24 hours 06/15/14 1306        Assessment/Plan: Impression: Small bowel obstruction, resolved Plan: May advance diet as tolerated. Okay for discharge from surgery standpoint.  LOS: 1 day    Kameren Baade A 06/16/2014

## 2014-06-16 NOTE — Progress Notes (Signed)
UR chart review completed.  

## 2014-06-16 NOTE — Discharge Summary (Signed)
Physician Discharge Summary  Karen [name] DJM:[id number] DOB: February 04, 1930 DOA: 06/15/2014  PCP: [name], MD  Admit date: 06/15/2014 Discharge date: 06/16/2014  Time spent: 40 minutes  Recommendations for Outpatient Follow-up:  1. Follow up with PCP 1-2 weeks for evaluation of symptoms, resolution of UTI recommend BMET to track creatinine   Discharge Diagnoses:  Principal Problem:   Small bowel obstruction, partial Active Problems:   Left bundle branch block   Benign hypertensive heart disease without heart failure   Hypothyroid   Iron deficiency anemia due to chronic blood loss   Acute abdominal pain   Acute renal failure   Hyperglycemia   UTI (urinary tract infection)   Small bowel obstruction   Abdominal pain, acute   Discharge Condition: stable  Diet recommendation: full liquid to be advanced as tolerated by patient  Filed Weights   06/15/14 0925  Weight: 67.495 kg (148 lb 12.8 oz)    History of present illness:  Karen [name] is a very pleasant 81 y.o. female with a past medical history of ileostomy secondary to colon cancer, hypertension, hypothyroid, GERD, IDA, bundle branch block presented to emergency Department on 06/15/14 chief complaint worsening abdominal pain and distention. Initial evaluation revealed possible partial or early small bowel obstruction.  Patient reported intermittent abdominal pain over  3 or 4 months worsening in the previous 24 hours. Pain  located in the lower quadrants described as cramp like worsening with eating. Associated symptoms included decreased volume of stool in ileostomy bag, nausea but no vomiting. Passing stool from her rectum. She denied bright red blood per rectum or stoma she denied melena. She reported she'd had obstructions in the past and felt certain she was having a recurrence. In addition she described some dysuria and frequency. She reported taking sublingual Zofran with little relief. She denied fever chills recent  travel or sick contacts. She denied chest pain palpitation shortness of breath. She denied headache visual disturbances syncope or near-syncope.   Workup in the emergency department included metabolic panel significant for creatinine of 1.24 serum glucose of 134, CBC with differential that's unremarkable, urinalysis concerning for a UTI abdominal x-ray mildly dilated stacked small bowel loops, concerning for possible partial or early obstruction.  In the emergency department she was provided with 2 mg of morphine 4 mg of Zofran and an NG tube. At the time of my evaluation she is afebrile hemodynamically stable and not hypoxic  Hospital Course:  Small bowel obstruction, partial: In patient status post ileostomy 6 years ago for colon cancer. confirmed by x-ray. Provided with NG and quickly improved. Evaluated by general surgery who opined obstruction likely related to adhesive disease and recommended supportive therapy. NG drainage decreased and ileostomy drainage increased. NG discontinued. Diet slowly advanced. At discharge tolerating full liquid without problem.  Of note chart review indicates a [facility] in [place] has informed patient she would not tolerate further abdominal surgery. Follow up with PCP 1-2 weeks for evaluation of symptoms Active Problems: Acute renal failure: Likely related to decreased by mouth intake. Also may be a component of some chronic disease. Creatinine trending down at discharge.     UTI (urinary tract infection): Analysis consistent with clinical presentation as well. Rocephin provided for 2 days. Discharged with oral antibiotics to complete 7 day course. Culture pending. She remained afebrile nontoxic appearing.   Acute abdominal pain: Related to #1. See therapy is noted in #1. Analgesics as needed.   Hyperglycemia: No history of diabetes: Check hemoglobin A1c. Monitor  CBG plus sliding-scale insulin as indicated   Left bundle branch block: Per chart review  very recent visit with Dr. Mare Ferrari. History of normal coronary arteries in 2002 and normal Myoview stress test in 7517, normal systolic function with diastolic dysfunction by echocardiogram, chronic left bundle branch block.No chest pain.    Benign hypertensive heart disease without heart failure: Stable.   Hypothyroid: Recent TSH 2.0.  Procedures:  none  Consultations:  General surgery  Discharge Exam: Filed Vitals:   06/16/14 1243  BP: 123/51  Pulse: 70  Temp:   Resp:     General: well nourished appears comfortable Cardiovascular: S1 and S2 no MGR no LE edema Respiratory: normal effort BS distant no wheeze or crackles Abdomen: soft non-distended ileostomy full of gas/fluid  Discharge Instructions    Current Discharge Medication List    START taking these medications   Details  ciprofloxacin (CIPRO) 500 MG tablet Take 1 tablet (500 mg total) by mouth 2 (two) times daily. Qty: 10 tablet, Refills: 0      CONTINUE these medications which have NOT CHANGED   Details  ALPRAZolam (XANAX) 0.5 MG tablet Take 0.5 mg by mouth at bedtime.     amitriptyline (ELAVIL) 10 MG tablet Take 20 mg by mouth at bedtime.     aspirin EC 325 MG tablet Take 325 mg by mouth daily.    bifidobacterium infantis (ALIGN) capsule Take 1 capsule by mouth daily. Qty: 14 capsule, Refills: 0   Associated Diagnoses: Bloating    carvedilol (COREG) 12.5 MG tablet Take 1 tablet (12.5 mg total) by mouth 2 (two) times daily with a meal. Qty: 180 tablet, Refills: 3    digoxin (LANOXIN) 0.125 MG tablet TAKE ONE TABLET BY MOUTH ONCE DAILY Qty: 90 tablet, Refills: 3    HYDROcodone-acetaminophen (NORCO/VICODIN) 5-325 MG per tablet Take 1 tablet by mouth every 4 (four) hours as needed for moderate pain.    hyoscyamine (LEVSIN SL) 0.125 MG SL tablet Place 0.125 mg under the tongue every 4 (four) hours as needed (lower abdominal pain).    ketoconazole (NIZORAL) 2 % cream Apply 1 application topically 2  (two) times daily. Apply to feet twice daily    levothyroxine (SYNTHROID, LEVOTHROID) 75 MCG tablet Take 75 mcg by mouth daily.     losartan-hydrochlorothiazide (HYZAAR) 100-12.5 MG per tablet Take 1 tablet by mouth daily. Qty: 90 tablet, Refills: 3    Multiple Vitamins-Minerals (EQL GUMMY ADULT) CHEW Chew 2 tablets by mouth daily.     NITROSTAT 0.4 MG SL tablet DISSOLVE ONE TABLET UNDER THE TONGUE EVERY 5 MINUTES AS NEEDED FOR CHEST PAIN.  DO NOT EXCEED A TOTAL OF 3 DOSES IN 15 MINUTES Qty: 25 tablet, Refills: 0    Omega-3 Fatty Acids (FISH OIL) 1200 MG CAPS Take 1,200 mg by mouth daily.    ondansetron (ZOFRAN-ODT) 4 MG disintegrating tablet Take 1 tablet (4 mg total) by mouth every 8 (eight) hours as needed for nausea or vomiting. Qty: 20 tablet, Refills: 0    pantoprazole (PROTONIX) 40 MG tablet Take 40 mg by mouth 2 (two) times daily.      STOP taking these medications     mesalamine (CANASA) 1000 MG suppository        Allergies  Allergen Reactions  . Bactrim [Sulfamethoxazole-Trimethoprim] Shortness Of Breath and Other (See Comments)    Hurting all over  . Lidocaine Hcl Other (See Comments)    tachycardia  . Lisinopril Cough  . Nitrofurantoin Monohyd Macro Other (See Comments)  Unknown   . Ramipril [Ramipril] Cough  . Codeine Palpitations and Other (See Comments)    Heart races  . Demerol Palpitations and Other (See Comments)    tachycardia  . Morphine And Related Palpitations    Tachycardia    Follow-up Information    Follow up with [name], MD. Schedule an appointment as soon as possible for a visit in 1 week.   Specialty:  Family Medicine   Why:  follow up regarding abdominal pain.   Contact information:   [place] [id number] [phone]        The results of significant diagnostics from this hospitalization (including imaging, microbiology, ancillary and laboratory) are listed below for reference.    Significant Diagnostic  Studies: Dg Abd Acute W/chest  06/15/2014   CLINICAL DATA:  Pain and vomiting starting at 21:30  EXAM: DG ABDOMEN ACUTE W/ 1V CHEST  COMPARISON:  07/11/2013  FINDINGS: There are mildly dilated and stacked small bowel loops in the mid abdomen, concerning for possible partial or early obstruction. There is stool and air throughout the colon to the rectum. There is no free intraperitoneal air. The upright view of the chest demonstrates mild unchanged cardiomegaly but no acute cardiopulmonary findings.  IMPRESSION: Mildly dilated stacked small bowel loops, concerning for possible partial or early obstruction.   Electronically Signed   By: Andreas Newport M.D.   On: 06/15/2014 04:32    Microbiology: No results found for this or any previous visit (from the past 240 hour(s)).   Labs: Basic Metabolic Panel:  Recent Labs Lab 06/15/14 0355 06/16/14 0659  NA 137 142  K 3.8 3.6  CL 101 104  CO2 27 29  GLUCOSE 134* 108*  BUN 20 21*  CREATININE 1.24* 1.06*  CALCIUM 9.5 8.9   Liver Function Tests:  Recent Labs Lab 06/15/14 0355  AST 31  ALT 27  ALKPHOS 71  BILITOT 0.7  PROT 8.3*  ALBUMIN 4.3   No results for input(s): LIPASE, AMYLASE in the last 168 hours. No results for input(s): AMMONIA in the last 168 hours. CBC:  Recent Labs Lab 06/15/14 0355  WBC 8.1  NEUTROABS 6.1  HGB 12.8  HCT 39.4  MCV 90.0  PLT 181   Cardiac Enzymes: No results for input(s): CKTOTAL, CKMB, CKMBINDEX, TROPONINI in the last 168 hours. BNP: BNP (last 3 results) No results for input(s): BNP in the last 8760 hours.  ProBNP (last 3 results) No results for input(s): PROBNP in the last 8760 hours.  CBG: No results for input(s): GLUCAP in the last 168 hours.     SignedRadene Gunning  [facility] 06/16/2014, 2:20 PM

## 2014-06-16 NOTE — Care Management Note (Signed)
Case Management Note  Patient Details  Name: Karen [name] MRN: [id number] Date of Birth: January 06, 1930  Subjective/Objective:                  Pt admitted from home with SBO. Pt lives with her husband and will return home at discharge. Pt is fairly independent with ADL's. Pt has a quad cane that she uses when out.  Action/Plan: Anticipate discharge within 24 hours. No Cm needs noted.  Expected Discharge Date:  06/17/14               Expected Discharge Plan:  Home/Self Care  In-House Referral:  NA  Discharge planning Services  CM Consult  Post Acute Care Choice:  NA Choice offered to:  NA  DME Arranged:    DME Agency:     HH Arranged:    HH Agency:     Status of Service:  Completed, signed off  Medicare Important Message Given:  N/A - LOS <3 / Initial given by admissions Date Medicare IM Given:    Medicare IM give by:    Date Additional Medicare IM Given:    Additional Medicare Important Message give by:     If discussed at [place] of Stay Meetings, dates discussed:    Additional Comments:  Joylene Draft, RN 06/16/2014, 1:37 PM

## 2014-06-20 LAB — URINE CULTURE: Colony Count: 15000

## 2014-06-23 ENCOUNTER — Other Ambulatory Visit (HOSPITAL_COMMUNITY): Payer: Self-pay

## 2014-06-23 DIAGNOSIS — K909 Intestinal malabsorption, unspecified: Secondary | ICD-10-CM

## 2014-06-28 DIAGNOSIS — F411 Generalized anxiety disorder: Secondary | ICD-10-CM | POA: Diagnosis not present

## 2014-06-28 DIAGNOSIS — S80861A Insect bite (nonvenomous), right lower leg, initial encounter: Secondary | ICD-10-CM | POA: Diagnosis not present

## 2014-06-28 DIAGNOSIS — L218 Other seborrheic dermatitis: Secondary | ICD-10-CM | POA: Diagnosis not present

## 2014-06-28 DIAGNOSIS — R300 Dysuria: Secondary | ICD-10-CM | POA: Diagnosis not present

## 2014-06-28 DIAGNOSIS — K5669 Other intestinal obstruction: Secondary | ICD-10-CM | POA: Diagnosis not present

## 2014-06-30 ENCOUNTER — Encounter (HOSPITAL_COMMUNITY): Payer: Self-pay | Admitting: Hematology & Oncology

## 2014-06-30 ENCOUNTER — Encounter (HOSPITAL_COMMUNITY): Payer: Medicare Other | Attending: Hematology and Oncology | Admitting: Hematology & Oncology

## 2014-06-30 ENCOUNTER — Encounter (HOSPITAL_BASED_OUTPATIENT_CLINIC_OR_DEPARTMENT_OTHER): Payer: Medicare Other

## 2014-06-30 VITALS — BP 120/51 | HR 63 | Temp 97.9°F | Resp 18 | Wt 141.6 lb

## 2014-06-30 DIAGNOSIS — C189 Malignant neoplasm of colon, unspecified: Secondary | ICD-10-CM | POA: Insufficient documentation

## 2014-06-30 DIAGNOSIS — D500 Iron deficiency anemia secondary to blood loss (chronic): Secondary | ICD-10-CM | POA: Diagnosis not present

## 2014-06-30 DIAGNOSIS — K909 Intestinal malabsorption, unspecified: Secondary | ICD-10-CM

## 2014-06-30 DIAGNOSIS — D509 Iron deficiency anemia, unspecified: Secondary | ICD-10-CM | POA: Diagnosis not present

## 2014-06-30 DIAGNOSIS — Z85038 Personal history of other malignant neoplasm of large intestine: Secondary | ICD-10-CM | POA: Diagnosis not present

## 2014-06-30 LAB — CBC WITH DIFFERENTIAL/PLATELET
Basophils Absolute: 0 10*3/uL (ref 0.0–0.1)
Basophils Relative: 0 % (ref 0–1)
EOS PCT: 2 % (ref 0–5)
Eosinophils Absolute: 0.1 10*3/uL (ref 0.0–0.7)
HCT: 34.5 % — ABNORMAL LOW (ref 36.0–46.0)
Hemoglobin: 11.4 g/dL — ABNORMAL LOW (ref 12.0–15.0)
LYMPHS PCT: 20 % (ref 12–46)
Lymphs Abs: 1.2 10*3/uL (ref 0.7–4.0)
MCH: 29.4 pg (ref 26.0–34.0)
MCHC: 33 g/dL (ref 30.0–36.0)
MCV: 88.9 fL (ref 78.0–100.0)
MONO ABS: 0.6 10*3/uL (ref 0.1–1.0)
MONOS PCT: 9 % (ref 3–12)
NEUTROS ABS: 4.3 10*3/uL (ref 1.7–7.7)
NEUTROS PCT: 69 % (ref 43–77)
PLATELETS: 184 10*3/uL (ref 150–400)
RBC: 3.88 MIL/uL (ref 3.87–5.11)
RDW: 12.8 % (ref 11.5–15.5)
WBC: 6.2 10*3/uL (ref 4.0–10.5)

## 2014-06-30 LAB — FERRITIN: FERRITIN: 86 ng/mL (ref 11–307)

## 2014-06-30 NOTE — Patient Instructions (Signed)
[place] at [facility] Discharge Instructions  RECOMMENDATIONS MADE BY THE CONSULTANT AND ANY TEST RESULTS WILL BE SENT TO YOUR REFERRING PHYSICIAN.  [place] at [facility] Discharge Instructions  RECOMMENDATIONS MADE BY THE CONSULTANT AND ANY TEST RESULTS WILL BE SENT TO YOUR REFERRING PHYSICIAN.  Exam and discussion by Dr. Whitney Muse. Will give iron infusion if indicated  Recheck labs in 3 and 6 months and office visit in 6 months.  Thank you for choosing [place] at [facility] to provide your oncology and hematology care.  To afford each patient quality time with our provider, please arrive at least 15 minutes before your scheduled appointment time.    You need to re-schedule your appointment should you arrive 10 or more minutes late.  We strive to give you quality time with our providers, and arriving late affects you and other patients whose appointments are after yours.  Also, if you no show three or more times for appointments you may be dismissed from the clinic at the providers discretion.     Again, thank you for choosing [facility].  Our hope is that these requests will decrease the amount of time that you wait before being seen by our physicians.       _____________________________________________________________  Should you have questions after your visit to [facility], please contact our office at [phone] between the hours of 8:30 a.m. and 4:30 p.m.  Voicemails left after 4:30 p.m. will not be returned until the following business day.  For prescription refill requests, have your pharmacy contact our office.      Thank you for choosing [place] at [facility] to provide your oncology and hematology care.  To afford each patient quality time with our provider, please arrive at least 15 minutes before your scheduled appointment time.    You need  to re-schedule your appointment should you arrive 10 or more minutes late.  We strive to give you quality time with our providers, and arriving late affects you and other patients whose appointments are after yours.  Also, if you no show three or more times for appointments you may be dismissed from the clinic at the providers discretion.     Again, thank you for choosing [facility].  Our hope is that these requests will decrease the amount of time that you wait before being seen by our physicians.       _____________________________________________________________  Should you have questions after your visit to [facility], please contact our office at [phone] between the hours of 8:30 a.m. and 4:30 p.m.  Voicemails left after 4:30 p.m. will not be returned until the following business day.  For prescription refill requests, have your pharmacy contact our office.

## 2014-06-30 NOTE — Progress Notes (Signed)
Karen Hilding, MD Karen [name] / [place] [id number]   DIAGNOSIS: Colon carcinoma with permanent ostomy Diversion Colitis Achalasia Iron deficiency, anemia secondary to GI related blood loss Hemeoccult positive stools  CURRENT THERAPY: IV iron, intolerance to oral iron  INTERVAL HISTORY: Karen [name] 81 y.o. female returns for chronic GI related blood loss, anemia. She has frequent problems with SBO and has had a recent admission to [facility]. She states these occur from adhesions.  This past wednesday night, experienced difficulty eating and vomited X 1. She notes she then moves to a liquid diet and her symptoms resolve.    She has no other major complaints today.  MEDICAL HISTORY: Past Medical History  Diagnosis Date  . Chronic diastolic CHF (congestive heart failure)     a. nl EF by echo 05/2009.  Marland Kitchen DVT of deep femoral vein   . Palpitations   . History of PSVT (paroxysmal supraventricular tachycardia)   . LBBB (left bundle branch block)   . Hypertension   . Abdominal adhesions   . Ileostomy present   . Colon polyp   . Chest pain     a. 2002 Cath: nl cors;  b. 2009 aden mv: nl;  c. 05/2009 Echo: nl.  . Wears dentures   . Breast lump   . Hyperlipidemia   . Thyroid disease   . Anemia   . History of blood clots   . Hernia   . Blood transfusion   . Hearing loss   . Arthritis   . GERD (gastroesophageal reflux disease)   . Cancer     bladder  . Colon cancer     x 2. Last 2010  . Iron deficiency anemia due to chronic blood loss 07/06/2013  . UTI (urinary tract infection)   . Obstruction of intestine or colon 06/2014    has Left bundle branch block; Chest pain, exertional; Benign hypertensive heart disease without heart failure; Hypothyroid; Small bowel obstruction, partial; Dysphagia; Paroxysmal SVT (supraventricular tachycardia); Ileostomy status; Colon cancer; Unstable angina; Parastomal hernia; Iron deficiency anemia due to chronic blood loss; Malabsorption  of iron; Acute abdominal pain; Acute renal failure; Hyperglycemia; UTI (urinary tract infection); Small bowel obstruction; and Abdominal pain, acute on her problem list.     is allergic to bactrim; lidocaine hcl; lisinopril; nitrofurantoin monohyd macro; ramipril; codeine; demerol; and morphine and related.   Iron Infusion in March 2016. Mammogram performed in 2015. Reported multiple hernias.   SURGICAL HISTORY: Past Surgical History  Procedure Laterality Date  . Cardiac catheterization  12/10/2000    THE LEFT VENTRICLE IS MILDY DILATED. THERE IS MILD TO MODERATE DIFFUSE HYPOKINESIS WITH EF 35%  . Colon cancer surgery    . Ileostomy    . Esophagogastroduodenoscopy  02/13/2011    Procedure: ESOPHAGOGASTRODUODENOSCOPY (EGD);  Surgeon: Rogene Houston, MD;  [facility];  Service: Endoscopy;  Laterality: N/A;  1030  . Colonoscopy  09/26/2011    Procedure: COLONOSCOPY;  Surgeon: Rogene Houston, MD;  Location: [facility];  Service: Endoscopy;  Laterality: N/A;  215  . Cataract extraction w/phaco  11/13/2011    Procedure: CATARACT EXTRACTION PHACO AND INTRAOCULAR LENS PLACEMENT (IOC);  Surgeon: Tonny Branch, MD;  Location: [facility] ORS;  Service: Ophthalmology;  Laterality: Right;  CDE 12.26  . Cataract extraction w/phaco  12/08/2011    Procedure: CATARACT EXTRACTION PHACO AND INTRAOCULAR LENS PLACEMENT (IOC);  Surgeon: Tonny Branch, MD;  Location: [facility];  Service: Ophthalmology;  Laterality: Left;  CDE 15.11  . Esophagogastroduodenoscopy N/A 09/01/2013    Procedure: ESOPHAGOGASTRODUODENOSCOPY (EGD);  Surgeon: Rogene Houston, MD;  [facility]: [facility];  Service: Endoscopy;  Laterality: N/A;  830  . Botox injection N/A 09/01/2013    Procedure: BOTOX INJECTION;  Surgeon: Rogene Houston, MD;  Location: [facility];  Service: Endoscopy;  Laterality: N/A;    SOCIAL HISTORY: History   Social History  . Marital Status: Married    Spouse Name: N/A  . Number of Children: N/A  . Years of  Education: N/A   Occupational History  . Not on file.   Social History Main Topics  . Smoking status: Former Smoker    Quit date: 02/11/1963  . Smokeless tobacco: Never Used  . Alcohol Use: No  . Drug Use: No  . Sexual Activity: Not on file   Other Topics Concern  . Not on file   Social History Narrative   Reads inspirational books   FAMILY HISTORY: Family History  Problem Relation Age of Onset  . Stroke    . Hypertension    . Heart disease Mother   . Stroke Mother     deceased  . Cancer Mother     bladder  . Arthritis Mother   . Arthritis Father   . Heart disease Father     decesaed  . Cancer Father     leukemia  . Stroke Sister     alive/debilitated  . Hypertension Sister   . Other Sister     paralysis  . Heart disease Brother     bypass surgery  . Cancer Brother   . Arthritis Brother   . Stroke Sister     alive/debilitated  . Diabetes Sister   . Other Brother     stomach problems  . Other Brother     bladder    Review of Systems  Constitutional: Negative for fever, chills, weight loss and malaise/fatigue.  HENT: Positive for hearing loss. Negative for congestion, nosebleeds, sore throat and tinnitus.   Eyes: Negative for blurred vision, double vision, pain and discharge.  Respiratory: Negative for cough, hemoptysis, sputum production, shortness of breath and wheezing.   Cardiovascular: Negative for chest pain, palpitations, claudication, leg swelling and PND.  Gastrointestinal: Positive for nausea and vomiting. Negative for heartburn, abdominal pain, diarrhea, constipation, blood in stool and melena.  Genitourinary: Negative for dysuria, urgency, frequency and hematuria.  Musculoskeletal: Positive for joint pain. Negative for myalgias and falls.  Skin: Negative for itching and rash.  Neurological: Positive for weakness. Negative for dizziness, tingling, tremors, sensory change, speech change, focal weakness, seizures, loss of consciousness and  headaches.  Endo/Heme/Allergies: Does not bruise/bleed easily.  Psychiatric/Behavioral: Negative for depression, suicidal ideas, memory loss and substance abuse. The patient is not nervous/anxious and does not have insomnia.     PHYSICAL EXAMINATION  ECOG PERFORMANCE STATUS: 1 - Symptomatic but completely ambulatory  Filed Vitals:   06/30/14 1200  BP: 120/51  Pulse: 63  Temp: 97.9 F (36.6 C)  Resp: 18    Physical Exam  Constitutional: She is oriented to person, place, and time and well-developed, well-nourished, and in no distress.  HENT:  Head: Normocephalic and atraumatic.  Nose: Nose normal.  Mouth/Throat: Oropharynx is clear and moist. No oropharyngeal exudate.  Eyes: Conjunctivae and EOM are normal. Pupils are equal, round, and reactive to light. Right eye exhibits no discharge. Left eye exhibits no discharge. No scleral icterus.  Neck: Normal range of motion.  Neck supple. No tracheal deviation present. No thyromegaly present.  Cardiovascular: Normal rate, regular rhythm and normal heart sounds.  Exam reveals no gallop and no friction rub.   No murmur heard. Pulmonary/Chest: Effort normal and breath sounds normal. She has no wheezes. She has no rales.  Abdominal: Soft. Bowel sounds are normal. She exhibits no distension and no mass. There is no tenderness. There is no rebound and no guarding.  Musculoskeletal: Normal range of motion. She exhibits no edema.  Lymphadenopathy:    She has no cervical adenopathy.  Neurological: She is alert and oriented to person, place, and time. She has normal reflexes. No cranial nerve deficit. Gait normal. Coordination normal.  Skin: Skin is warm and dry. No rash noted.  Psychiatric: Mood, memory, affect and judgment normal.  Nursing note and vitals reviewed.    LABORATORY DATA:  CBC    Component Value Date/Time   WBC 6.2 06/30/2014 1053   RBC 3.88 06/30/2014 1053   RBC 3.45* 11/24/2008 1500   HGB 11.4* 06/30/2014 1053   HCT 34.5*  06/30/2014 1053   PLT 184 06/30/2014 1053   MCV 88.9 06/30/2014 1053   MCH 29.4 06/30/2014 1053   MCHC 33.0 06/30/2014 1053   RDW 12.8 06/30/2014 1053   LYMPHSABS 1.2 06/30/2014 1053   MONOABS 0.6 06/30/2014 1053   EOSABS 0.1 06/30/2014 1053   BASOSABS 0.0 06/30/2014 1053   CMP     Component Value Date/Time   NA 142 06/16/2014 0659   K 3.6 06/16/2014 0659   CL 104 06/16/2014 0659   CO2 29 06/16/2014 0659   GLUCOSE 108* 06/16/2014 0659   BUN 21* 06/16/2014 0659   CREATININE 1.06* 06/16/2014 0659   CREATININE 1.04 06/07/2012 1651   CALCIUM 8.9 06/16/2014 0659   PROT 8.3* 06/15/2014 0355   ALBUMIN 4.3 06/15/2014 0355   AST 31 06/15/2014 0355   ALT 27 06/15/2014 0355   ALKPHOS 71 06/15/2014 0355   BILITOT 0.7 06/15/2014 0355   GFRNONAA 48* 06/16/2014 0659   GFRAA 55* 06/16/2014 0659     ASSESSMENT and THERAPY PLAN:  Colon carcinoma with permanent ostomy Diversion Colitis Achalasia Iron deficiency, anemia secondary to GI related blood loss Hemeoccult positive stools  She follows regularly with Dr. Laural Golden. She would like to continue with IV iron therapy if needed. It does improve her fatigue.  I advised her we would call her with her ferritin results when available. If she needs iron replacement we will schedule.  We will follow her laboratory studies every 3 months and see her in the office with a formal office visit and physical exam every 6 months.   All questions were answered. The patient knows to call the clinic with any problems, questions or concerns. We can certainly see the patient much sooner if necessary. This note was electronically signed.  This document serves as a record of services personally performed by Ancil Linsey, MD. It was created on her behalf by Pearlie Oyster, a trained medical scribe. The creation of this record is based on the scribe's personal observations and the provider's statements to them. This document has been checked and approved by  the attending provider.    I have reviewed the above documentation for accuracy and completeness, and I agree with the above.  Kelby Fam. Penland MD

## 2014-06-30 NOTE — Progress Notes (Signed)
Labs drawn

## 2014-07-01 LAB — CEA: CEA: 35.5 ng/mL — AB (ref 0.0–5.0)

## 2014-07-03 ENCOUNTER — Other Ambulatory Visit (HOSPITAL_COMMUNITY): Payer: Self-pay | Admitting: Oncology

## 2014-07-03 DIAGNOSIS — C189 Malignant neoplasm of colon, unspecified: Secondary | ICD-10-CM

## 2014-07-03 DIAGNOSIS — R970 Elevated carcinoembryonic antigen [CEA]: Secondary | ICD-10-CM

## 2014-07-04 ENCOUNTER — Telehealth (HOSPITAL_COMMUNITY): Payer: Self-pay | Admitting: *Deleted

## 2014-07-04 ENCOUNTER — Other Ambulatory Visit (HOSPITAL_COMMUNITY): Payer: Self-pay | Admitting: Oncology

## 2014-07-04 NOTE — Telephone Encounter (Signed)
Scans scheduled for 07/13/14 @ 10:15.  Daughter to come by and pick up prep and instructions.

## 2014-07-05 ENCOUNTER — Telehealth (HOSPITAL_COMMUNITY): Payer: Self-pay | Admitting: Hematology & Oncology

## 2014-07-05 NOTE — Telephone Encounter (Signed)
With patient's daughter Katharine Look in detail about her mother's elevated CEA. We have ordered imaging studies and the family is anxious although very agreeable to proceed. Her daughter wishes to meet with me after the imaging studies with her mother to review the results. She had questions regarding treatability of malignancy if it has returned. I answered those to the best of my ability, but try to advise her we need the results of the studies before we can have conversations regarding treatment options and/or if treatment is needed. We will see them back after CT scans next week. Donald Pore MD

## 2014-07-07 DIAGNOSIS — M4317 Spondylolisthesis, lumbosacral region: Secondary | ICD-10-CM | POA: Diagnosis not present

## 2014-07-07 DIAGNOSIS — M25851 Other specified joint disorders, right hip: Secondary | ICD-10-CM | POA: Diagnosis not present

## 2014-07-07 DIAGNOSIS — Z9889 Other specified postprocedural states: Secondary | ICD-10-CM | POA: Diagnosis not present

## 2014-07-07 DIAGNOSIS — M25551 Pain in right hip: Secondary | ICD-10-CM | POA: Diagnosis not present

## 2014-07-07 DIAGNOSIS — M1611 Unilateral primary osteoarthritis, right hip: Secondary | ICD-10-CM | POA: Diagnosis not present

## 2014-07-07 DIAGNOSIS — M810 Age-related osteoporosis without current pathological fracture: Secondary | ICD-10-CM | POA: Diagnosis not present

## 2014-07-07 DIAGNOSIS — M47816 Spondylosis without myelopathy or radiculopathy, lumbar region: Secondary | ICD-10-CM | POA: Diagnosis not present

## 2014-07-13 ENCOUNTER — Ambulatory Visit (HOSPITAL_COMMUNITY)
Admission: RE | Admit: 2014-07-13 | Discharge: 2014-07-13 | Disposition: A | Discharge status: Home / Self Care | Payer: Medicare Other | Source: Ambulatory Visit | Attending: Oncology | Admitting: Oncology

## 2014-07-13 DIAGNOSIS — C19 Malignant neoplasm of rectosigmoid junction: Secondary | ICD-10-CM | POA: Insufficient documentation

## 2014-07-13 DIAGNOSIS — R970 Elevated carcinoembryonic antigen [CEA]: Secondary | ICD-10-CM | POA: Diagnosis not present

## 2014-07-13 DIAGNOSIS — R934 Abnormal findings on diagnostic imaging of urinary organs: Secondary | ICD-10-CM | POA: Diagnosis not present

## 2014-07-13 DIAGNOSIS — Z9049 Acquired absence of other specified parts of digestive tract: Secondary | ICD-10-CM | POA: Insufficient documentation

## 2014-07-13 DIAGNOSIS — C189 Malignant neoplasm of colon, unspecified: Secondary | ICD-10-CM

## 2014-07-13 DIAGNOSIS — K760 Fatty (change of) liver, not elsewhere classified: Secondary | ICD-10-CM | POA: Diagnosis not present

## 2014-07-13 DIAGNOSIS — N281 Cyst of kidney, acquired: Secondary | ICD-10-CM | POA: Diagnosis not present

## 2014-07-13 DIAGNOSIS — Z85038 Personal history of other malignant neoplasm of large intestine: Secondary | ICD-10-CM | POA: Diagnosis not present

## 2014-07-13 DIAGNOSIS — R932 Abnormal findings on diagnostic imaging of liver and biliary tract: Secondary | ICD-10-CM | POA: Diagnosis not present

## 2014-07-13 DIAGNOSIS — R160 Hepatomegaly, not elsewhere classified: Secondary | ICD-10-CM | POA: Diagnosis not present

## 2014-07-13 MED ORDER — IOHEXOL 300 MG/ML  SOLN
100.0000 mL | Freq: Once | INTRAMUSCULAR | Status: AC | PRN
  Administered 2014-07-13: 80 mL via INTRAVENOUS

## 2014-07-16 DIAGNOSIS — S80861A Insect bite (nonvenomous), right lower leg, initial encounter: Secondary | ICD-10-CM | POA: Diagnosis not present

## 2014-07-16 DIAGNOSIS — K5669 Other intestinal obstruction: Secondary | ICD-10-CM | POA: Diagnosis not present

## 2014-07-16 DIAGNOSIS — R300 Dysuria: Secondary | ICD-10-CM | POA: Diagnosis not present

## 2014-07-16 DIAGNOSIS — L218 Other seborrheic dermatitis: Secondary | ICD-10-CM | POA: Diagnosis not present

## 2014-07-16 DIAGNOSIS — F411 Generalized anxiety disorder: Secondary | ICD-10-CM | POA: Diagnosis not present

## 2014-07-17 ENCOUNTER — Encounter (HOSPITAL_COMMUNITY): Payer: Medicare Other | Attending: Hematology and Oncology | Admitting: Hematology & Oncology

## 2014-07-17 DIAGNOSIS — D509 Iron deficiency anemia, unspecified: Secondary | ICD-10-CM | POA: Insufficient documentation

## 2014-07-17 DIAGNOSIS — C787 Secondary malignant neoplasm of liver and intrahepatic bile duct: Secondary | ICD-10-CM

## 2014-07-17 DIAGNOSIS — C189 Malignant neoplasm of colon, unspecified: Secondary | ICD-10-CM | POA: Insufficient documentation

## 2014-07-17 DIAGNOSIS — N2889 Other specified disorders of kidney and ureter: Secondary | ICD-10-CM

## 2014-07-17 NOTE — Progress Notes (Signed)
[name], MD [place] / [place] [address]   DIAGNOSIS: Colon carcinoma with permanent ostomy Diversion Colitis Achalasia Iron deficiency, anemia secondary to GI related blood loss Hemeoccult positive stools  Karen [name]'s family comes in today to discuss recent CT results. She had evidence of a rising CEA at her last visit. [place] and wishes for her family to come and review the results with me today.  Her CT imaging shows a new 1.3 cm mass in the left hepatic lobe consistent with liver metastases. Given her colon cancer history, rising CEA, I feel this is most likely a solitary metastases. She is 81 years old and I do not feel she would most likely be able to tolerate a liver resection but may be a good candidate for liver directed therapy such as RFA. I advised the patient's family I will discuss this with interventional radiology. We could also consider referral to an academic institution.  Further complicating the issue is a renal mass present in the lower pole of the left kidney that is suspicious for a solid renal neoplasm. We discussed further evaluation of this lesion as well. I advised in the based upon the presence of both abnormalities academic referral may not be unreasonable. The patient is very anxious to "treat" whatever needs to be treated. I tried to advised the family to keep in mind her age and other comorbidities.  I advised them I will reach out first to interventional radiology and see what is available locally. If she is a candidate for RFA of the liver we can proceed with that and then send her to urology for additional consultation. They would prefer to stay locally with possible. A total of 30 minutes was spent in direct family consultation and discussion.   CLINICAL DATA: Elevated CEA levels. Colorectal carcinoma. Previous surgery. Evaluate for recurrent or metastatic disease.  EXAM: CT CHEST, ABDOMEN, AND PELVIS WITH  CONTRAST  TECHNIQUE: Multidetector CT imaging of the chest, abdomen and pelvis was performed following the standard protocol during bolus administration of intravenous contrast.  CONTRAST: 24mL OMNIPAQUE IOHEXOL 300 MG/ML SOLN  COMPARISON: 01/04/2013  FINDINGS: CT CHEST FINDINGS  Mediastinum/Lymph Nodes: No masses or pathologically enlarged lymph nodes identified. Small less than 1 cm mediastinal lymph nodes in the right paratracheal region and right cardiophrenic angle are stable and not pathologically enlarged.  Lungs/Pleura: No pulmonary infiltrate or mass identified. No effusion present.  Musculoskeletal/Soft Tissues: No suspicious bone lesions or other significant chest wall abnormality.  CT ABDOMEN AND PELVIS FINDINGS  Hepatobiliary: Hepatic steatosis again noted. A new 1.3 cm hypovascular mass is seen in segment 2 of the left hepatic lobe, consistent with liver metastasis. No other liver masses are identified. A tiny sub-cm cyst in the caudate lobe appears stable.  Pancreas: No mass, inflammatory changes, or other significant abnormality identified.  Spleen: Within normal limits in size and appearance.  Adrenals: No masses identified.  Kidneys/Urinary Tract: Small benign-appearing right renal cyst again noted. A subcapsular lesion in the anterior lower pole left kidney shows mild increase in size, now measuring 1.2 cm compared to 1.0 cm previously. This shows possible contrast enhancement with washout on delayed imaging, suspicious for a solid renal neoplasm. No evidence of hydronephrosis.  Stomach/Bowel/Peritoneum: No evidence of wall thickening, mass, or obstruction. Stable postoperative changes from right colectomy and right lower quadrant ileostomy. Sigmoid diverticulosis is noted, without evidence of diverticulitis.  Vascular/Lymphatic: No pathologically enlarged lymph nodes identified. No other significant  abnormality  visualized.  Reproductive: No mass or other significant abnormality identified.  Other: Left hip prosthesis causes significant beam hardening artifact obscuring the inferior pelvis.  Musculoskeletal: No suspicious bone lesions identified.  IMPRESSION: New 1.3 cm hypovascular mass in segment 2 of the left hepatic lobe, consistent with liver metastasis.  No other sites of metastatic disease identified within the chest, abdomen, or pelvis.  Further mild enlargement of 1.2 cm subcapsular mass in the anterior lower pole of left kidney, suspicious for a solid renal neoplasm/renal cell carcinoma. Recommend abdomen MRI without and with contrast for further characterization. This recommendation follows ACR consensus guidelines: Managing Incidental Findings on Abdominal CT: White Paper of the ACR Incidental Findings Committee. [facility] [phone]   Electronically Signed  By: Earle Gell M.D.  Molli Hazard, MD

## 2014-07-20 NOTE — Telephone Encounter (Signed)
Call from daughter, Altamese Dilling wanting to know if Dr. Whitney Muse has discussed her mother's case with the radiologist regarding ablative therapy and with urology regarding treatment options. Can be reached at [phone].

## 2014-07-20 NOTE — Telephone Encounter (Signed)
Unable to reach by phone and message left for Karen [name] to call the office.

## 2014-07-20 NOTE — Telephone Encounter (Signed)
Discussed with Dr. Whitney Muse and she is waiting to hear back from radiology regarding the liver issue.  Urology consult to be done after that.  Daughter notified and verbalized understanding.

## 2014-07-20 NOTE — Telephone Encounter (Signed)
-----   Message from Louis Meckel sent at 07/20/2014 10:42 AM EDT ----- Regarding: questions Patient daughter has a few question that she wanted to see if Dr Whitney Muse could answer.  I told her a nurse would call her and get the info.  Please call Altamese Dilling at [phone]

## 2014-07-21 ENCOUNTER — Encounter (HOSPITAL_COMMUNITY): Payer: Self-pay | Admitting: Hematology & Oncology

## 2014-07-26 DIAGNOSIS — R300 Dysuria: Secondary | ICD-10-CM | POA: Diagnosis not present

## 2014-07-26 DIAGNOSIS — N390 Urinary tract infection, site not specified: Secondary | ICD-10-CM | POA: Diagnosis not present

## 2014-07-27 ENCOUNTER — Telehealth (HOSPITAL_COMMUNITY): Payer: Self-pay | Admitting: Lab

## 2014-07-27 ENCOUNTER — Telehealth (HOSPITAL_COMMUNITY): Payer: Self-pay

## 2014-07-27 ENCOUNTER — Encounter (HOSPITAL_COMMUNITY): Payer: Self-pay | Admitting: Lab

## 2014-07-27 NOTE — Progress Notes (Signed)
Referral to [facility]. Records faxed on 6/16

## 2014-07-27 NOTE — Progress Notes (Signed)
Referral sent to IR clinic Dr Gracy Racer.  Their office will contact patient.  Records faxed on 6/16

## 2014-07-27 NOTE — Telephone Encounter (Signed)
Call from daughter, Lew Dawes.  Wants to know if Dr. Whitney Muse has heard anything from the radiologist regarding the ablative therapy.  Family is becoming anxious and want to talk with Mrs. Perlman about what the plan is.  Can be reached at [phone] ext [phone].

## 2014-07-27 NOTE — Telephone Encounter (Signed)
Notified that per Dr. Whitney Muse, we will refer Karen [name] to Dr. Karin Golden at the [facility] for ablative therapy of her liver lesions and will also make a referral to [facility] regarding the renal mass.  Liver issue is first priority.  Requested that either she or her sister be called with the appointments.

## 2014-07-28 ENCOUNTER — Other Ambulatory Visit (HOSPITAL_COMMUNITY): Payer: Self-pay | Admitting: Hematology & Oncology

## 2014-07-28 DIAGNOSIS — R160 Hepatomegaly, not elsewhere classified: Secondary | ICD-10-CM

## 2014-08-03 ENCOUNTER — Encounter (HOSPITAL_COMMUNITY): Payer: Self-pay | Admitting: Hematology & Oncology

## 2014-08-03 ENCOUNTER — Encounter (HOSPITAL_BASED_OUTPATIENT_CLINIC_OR_DEPARTMENT_OTHER): Payer: Medicare Other | Admitting: Hematology & Oncology

## 2014-08-03 VITALS — BP 108/49 | HR 73 | Temp 98.5°F | Resp 18 | Wt 141.0 lb

## 2014-08-03 DIAGNOSIS — K5289 Other specified noninfective gastroenteritis and colitis: Secondary | ICD-10-CM | POA: Diagnosis not present

## 2014-08-03 DIAGNOSIS — N289 Disorder of kidney and ureter, unspecified: Secondary | ICD-10-CM

## 2014-08-03 DIAGNOSIS — R52 Pain, unspecified: Secondary | ICD-10-CM | POA: Diagnosis not present

## 2014-08-03 DIAGNOSIS — C189 Malignant neoplasm of colon, unspecified: Secondary | ICD-10-CM

## 2014-08-03 DIAGNOSIS — K769 Liver disease, unspecified: Secondary | ICD-10-CM

## 2014-08-03 DIAGNOSIS — K220 Achalasia of cardia: Secondary | ICD-10-CM | POA: Diagnosis not present

## 2014-08-03 DIAGNOSIS — R970 Elevated carcinoembryonic antigen [CEA]: Secondary | ICD-10-CM | POA: Diagnosis not present

## 2014-08-03 DIAGNOSIS — D500 Iron deficiency anemia secondary to blood loss (chronic): Secondary | ICD-10-CM

## 2014-08-03 DIAGNOSIS — C787 Secondary malignant neoplasm of liver and intrahepatic bile duct: Principal | ICD-10-CM

## 2014-08-03 NOTE — Progress Notes (Signed)
Karen Hilding, MD [place] / [place] [address]   DIAGNOSIS: Colon carcinoma with permanent ostomy Diversion Colitis Achalasia Iron deficiency, anemia secondary to GI related blood loss Hemeoccult positive stools Rising CEA with CT imaging on 07/13/2014 1.3 cm mass in L hepatic lobe Subcapsular mass in anterior pole of L kidney suspicious for RCC  CURRENT THERAPY: IV iron, intolerance to oral iron and Observation  History of Present Illness: Karen [name] presents today for additional discussion of a rising CEA and probable recurrence of her colorectal carcinoma as a solitary liver metastases. She also has a renal lesion that is suspicious for renal cell carcinoma.  She is here with her husband and daughter and presents today with complaints of pain on her right side.  She is in a wheelchair.She states that she has pinched nerves in her back and neck; the pain today has shifted from her hip to her groin and knee Her daughter says that she has advised her not to "push, pull or twist" a lot. The patient notes this is her major complaint.   She has been taking Hydrocodone, 5 mg, a long time; her family wants to increase the dosage due to the fact that it does not work too good.  She says that it works off and on. She was referred to pain management and has an appointment on July 14.   MEDICAL HISTORY: Past Medical History  Diagnosis Date  . Chronic diastolic CHF (congestive heart failure)     a. nl EF by echo 05/2009.  Marland Kitchen DVT of deep femoral vein   . Palpitations   . History of PSVT (paroxysmal supraventricular tachycardia)   . LBBB (left bundle branch block)   . Hypertension   . Abdominal adhesions   . Ileostomy present   . Colon polyp   . Chest pain     a. 2002 Cath: nl cors;  b. 2009 aden mv: nl;  c. 05/2009 Echo: nl.  . Wears dentures   . Breast lump   . Hyperlipidemia   . Thyroid disease   . Anemia   . History of blood clots   . Hernia   . Blood transfusion   .  Hearing loss   . Arthritis   . GERD (gastroesophageal reflux disease)   . Cancer     bladder  . Colon cancer     x 2. Last 2010  . Iron deficiency anemia due to chronic blood loss 07/06/2013  . UTI (urinary tract infection)   . Obstruction of intestine or colon 06/2014  . Shortness of breath dyspnea     at times  . Diabetes mellitus without complication 0/25/85    borderline type II; takes no medication for it  . Vision abnormalities 2016    not going blind but having retina problems; might lose ability to see faces    has Left bundle branch block; Chest pain, exertional; Benign hypertensive heart disease without heart failure; Hypothyroid; Small bowel obstruction, partial; Dysphagia; Paroxysmal SVT (supraventricular tachycardia); Ileostomy status; Colon cancer; Unstable angina; Parastomal hernia; Iron deficiency anemia due to chronic blood loss; Malabsorption of iron; Acute abdominal pain; Acute renal failure; Hyperglycemia; UTI (urinary tract infection); Small bowel obstruction; Abdominal pain, acute; and Liver mass, left lobe on her problem list.     is allergic to bactrim; codeine; demerol; lidocaine hcl; morphine and related; nitrofurantoin monohyd macro; lisinopril; and ramipril.   Iron Infusion in March 2016. Mammogram performed in 2015. Reported multiple hernias.  SURGICAL HISTORY: Past Surgical History  Procedure Laterality Date  . Cardiac catheterization  12/10/2000    THE LEFT VENTRICLE IS MILDY DILATED. THERE IS MILD TO MODERATE DIFFUSE HYPOKINESIS WITH EF 35%  . Colon cancer surgery    . Ileostomy    . Esophagogastroduodenoscopy  02/13/2011    Procedure: ESOPHAGOGASTRODUODENOSCOPY (EGD);  Surgeon: Rogene Houston, MD;  [facility];  Service: Endoscopy;  Laterality: N/A;  1030  . Colonoscopy  09/26/2011    Procedure: COLONOSCOPY;  Surgeon: Rogene Houston, MD;  [facility]: [facility];  Service: Endoscopy;  Laterality: N/A;  215  . Cataract extraction w/phaco   11/13/2011    Procedure: CATARACT EXTRACTION PHACO AND INTRAOCULAR LENS PLACEMENT (IOC);  Surgeon: Tonny Branch, MD;  Location: [facility];  Service: Ophthalmology;  Laterality: Right;  CDE 12.26  . Cataract extraction w/phaco  12/08/2011    Procedure: CATARACT EXTRACTION PHACO AND INTRAOCULAR LENS PLACEMENT (IOC);  Surgeon: Tonny Branch, MD;  Location: [facility];  Service: Ophthalmology;  Laterality: Left;  CDE 15.11  . Esophagogastroduodenoscopy N/A 09/01/2013    Procedure: ESOPHAGOGASTRODUODENOSCOPY (EGD);  Surgeon: Rogene Houston, MD;  [facility];  Service: Endoscopy;  Laterality: N/A;  830  . Botox injection N/A 09/01/2013    Procedure: BOTOX INJECTION;  Surgeon: Rogene Houston, MD;  [facility]: [facility];  Service: Endoscopy;  Laterality: N/A;  . Abdominal hysterectomy    . Cholecystectomy    . Joint replacement Left 2008  . Tonsillectomy    . Colon surgery    . Eye surgery  2014    catarac surgery  . Appendectomy      SOCIAL HISTORY: History   Social History  . Marital Status: Married    Spouse Name: N/A  . Number of Children: N/A  . Years of Education: N/A   Occupational History  . Not on file.   Social History Main Topics  . Smoking status: Former Smoker    Quit date: 02/11/1963  . Smokeless tobacco: Never Used  . Alcohol Use: No  . Drug Use: No  . Sexual Activity: Not on file   Other Topics Concern  . Not on file   Social History Narrative   Reads inspirational books   FAMILY HISTORY: Family History  Problem Relation Age of Onset  . Stroke    . Hypertension    . Heart disease Mother   . Stroke Mother     deceased  . Cancer Mother     bladder  . Arthritis Mother   . Arthritis Father   . Heart disease Father     decesaed  . Cancer Father     leukemia  . Stroke Sister     alive/debilitated  . Hypertension Sister   . Other Sister     paralysis  . Heart disease Brother     bypass surgery  . Cancer Brother   . Arthritis Brother   . Stroke  Sister     alive/debilitated  . Diabetes Sister   . Other Brother     stomach problems  . Other Brother     bladder    Review of Systems  Constitutional: Negative for fever, chills, weight loss and malaise/fatigue.  HENT: Positive for hearing loss. Negative for congestion, nosebleeds, sore throat and tinnitus.   Eyes: Negative for blurred vision, double vision, pain and discharge.  Respiratory: Negative for cough, hemoptysis, sputum production, shortness of breath and wheezing.   Cardiovascular: Negative for chest  pain, palpitations, claudication, leg swelling and PND.  Gastrointestinal: Positive for nausea and vomiting. Negative for heartburn, abdominal pain, diarrhea, constipation, blood in stool and melena.  Genitourinary: Negative for dysuria, urgency, frequency and hematuria.  Musculoskeletal: Positive for joint pain. Negative for myalgias and falls.  Skin: Negative for itching and rash.  Neurological: Positive for weakness. Negative for dizziness, tingling, tremors, sensory change, speech change, focal weakness, seizures, loss of consciousness and headaches.  Endo/Heme/Allergies: Does not bruise/bleed easily.  Psychiatric/Behavioral: Negative for depression, suicidal ideas, memory loss and substance abuse. The patient is not nervous/anxious and does not have insomnia.   14 point review of systems was performed and is negative except as detailed under history of present illness and above   PHYSICAL EXAMINATION  ECOG PERFORMANCE STATUS: 1 - Symptomatic but completely ambulatory  Filed Vitals:   08/03/14 0914  BP: 108/49  Pulse: 73  Temp: 98.5 F (36.9 C)  Resp: 18    Physical Exam  Constitutional: She is oriented to person, place, and time and well-developed, well-nourished, and in no distress. In a wheelchair. Gets onto the exam table with assistance HENT:  Head: Normocephalic and atraumatic.  Nose: Nose normal.  Mouth/Throat: Oropharynx is clear and moist. No  oropharyngeal exudate.  Eyes: Conjunctivae and EOM are normal. Pupils are equal, round, and reactive to light. Right eye exhibits no discharge. Left eye exhibits no discharge. No scleral icterus.  Neck: Normal range of motion. Neck supple. No tracheal deviation present. No thyromegaly present.  Cardiovascular: Normal rate, regular rhythm and normal heart sounds.  Exam reveals no gallop and no friction rub.   No murmur heard. Pulmonary/Chest: Effort normal and breath sounds normal. She has no wheezes. She has no rales.  Abdominal: Soft. Bowel sounds are normal. She exhibits no distension and no mass. There is no tenderness. There is no rebound and no guarding.  Musculoskeletal: Normal range of motion. She exhibits no edema.  Lymphadenopathy:    She has no cervical adenopathy.  Neurological: She is alert and oriented to person, place, and time. She has normal reflexes. No cranial nerve deficit. Gait normal. Coordination normal.  Skin: Skin is warm and dry. No rash noted.  Psychiatric: Mood, memory, affect and judgment normal.  Nursing note and vitals reviewed.    LABORATORY DATA:  CBC    Component Value Date/Time   WBC 6.2 06/30/2014 1053   RBC 3.88 06/30/2014 1053   RBC 3.45* 11/24/2008 1500   HGB 11.4* 06/30/2014 1053   HCT 34.5* 06/30/2014 1053   PLT 184 06/30/2014 1053   MCV 88.9 06/30/2014 1053   MCH 29.4 06/30/2014 1053   MCHC 33.0 06/30/2014 1053   RDW 12.8 06/30/2014 1053   LYMPHSABS 1.2 06/30/2014 1053   MONOABS 0.6 06/30/2014 1053   EOSABS 0.1 06/30/2014 1053   BASOSABS 0.0 06/30/2014 1053   CMP     Component Value Date/Time   NA 142 06/16/2014 0659   K 3.6 06/16/2014 0659   CL 104 06/16/2014 0659   CO2 29 06/16/2014 0659   GLUCOSE 108* 06/16/2014 0659   BUN 43* 08/15/2014 1356   CREATININE 1.50* 08/15/2014 1356   CREATININE 1.06* 06/16/2014 0659   CALCIUM 8.9 06/16/2014 0659   PROT 8.3* 06/15/2014 0355   ALBUMIN 4.3 06/15/2014 0355   AST 31 06/15/2014 0355     ALT 27 06/15/2014 0355   ALKPHOS 71 06/15/2014 0355   BILITOT 0.7 06/15/2014 0355   GFRNONAA 32* 08/15/2014 1356   GFRNONAA 48* 06/16/2014 9826  GFRAA 37* 08/15/2014 1356   GFRAA 55* 06/16/2014 6063   RADIOLOGY: I have reviewed the images detailed below and agree with the results:   CLINICAL DATA: Elevated CEA levels. Colorectal carcinoma. Previous surgery. Evaluate for recurrent or metastatic disease.  EXAM: CT CHEST, ABDOMEN, AND PELVIS WITH CONTRAST  TECHNIQUE: Multidetector CT imaging of the chest, abdomen and pelvis was performed following the standard protocol during bolus administration of intravenous contrast.  CONTRAST: 72mL OMNIPAQUE IOHEXOL 300 MG/ML SOLN  COMPARISON: 01/04/2013  FINDINGS: CT CHEST FINDINGS  Mediastinum/Lymph Nodes: No masses or pathologically enlarged lymph nodes identified. Small less than 1 cm mediastinal lymph nodes in the right paratracheal region and right cardiophrenic angle are stable and not pathologically enlarged.  Lungs/Pleura: No pulmonary infiltrate or mass identified. No effusion present.  Musculoskeletal/Soft Tissues: No suspicious bone lesions or other significant chest wall abnormality.  CT ABDOMEN AND PELVIS FINDINGS  Hepatobiliary: Hepatic steatosis again noted. A new 1.3 cm hypovascular mass is seen in segment 2 of the left hepatic lobe, consistent with liver metastasis. No other liver masses are identified. A tiny sub-cm cyst in the caudate lobe appears stable.  Pancreas: No mass, inflammatory changes, or other significant abnormality identified.  Spleen: Within normal limits in size and appearance.  Adrenals: No masses identified.  Kidneys/Urinary Tract: Small benign-appearing right renal cyst again noted. A subcapsular lesion in the anterior lower pole left kidney shows mild increase in size, now measuring 1.2 cm compared to 1.0 cm previously. This shows possible contrast enhancement  with washout on delayed imaging, suspicious for a solid renal neoplasm. No evidence of hydronephrosis.  Stomach/Bowel/Peritoneum: No evidence of wall thickening, mass, or obstruction. Stable postoperative changes from right colectomy and right lower quadrant ileostomy. Sigmoid diverticulosis is noted, without evidence of diverticulitis.  Vascular/Lymphatic: No pathologically enlarged lymph nodes identified. No other significant abnormality visualized.  Reproductive: No mass or other significant abnormality identified.  Other: Left hip prosthesis causes significant beam hardening artifact obscuring the inferior pelvis.  Musculoskeletal: No suspicious bone lesions identified.  IMPRESSION: New 1.3 cm hypovascular mass in segment 2 of the left hepatic lobe, consistent with liver metastasis.  No other sites of metastatic disease identified within the chest, abdomen, or pelvis.  Further mild enlargement of 1.2 cm subcapsular mass in the anterior lower pole of left kidney, suspicious for a solid renal neoplasm/renal cell carcinoma. Recommend abdomen MRI without and with contrast for further characterization. This recommendation follows ACR consensus guidelines: Managing Incidental Findings on Abdominal CT: White Paper of the ACR Incidental Findings Committee. [facility] [phone]   Electronically Signed  By: Earle Gell M.D.     ASSESSMENT and THERAPY PLAN:  Colon carcinoma with permanent ostomy Diversion Colitis Achalasia Iron deficiency, anemia secondary to GI related blood loss Hemeoccult positive stools Rising CEA with CT imaging on 07/13/2014 1.3 cm mass in L hepatic lobe Subcapsular mass in anterior pole of L kidney suspicious for RCC  She will be referred to interventional radiology for additional evaluation and treatment recommendations of her solitary liver lesion. I do not feel given her age that she would be a good candidate for definitive  surgical therapy. In addition the lesion is small and IR management may be adequate.  Refer her to urology for additional evaluation and recommendations regarding the solitary renal lesion.  She was instructed to increase her hydrocodone to 1-1/2 tablets when needed. She was instructed to keep her appointment with pain management. I will tentatively see her back in one  month to regroup on her ongoing treatment.  All questions were answered. The patient knows to call the clinic with any problems, questions or concerns. We can certainly see the patient much sooner if necessary.  This document was electronically signed.  This document serves as a record of services personally performed by Ancil Linsey, MD. It was created on her behalf by Janace Hoard, a trained medical scribe. The creation of this record is based on the scribe's personal observations and the provider's statements to them. This document has been checked and approved by the attending provider.  I have reviewed the above documentation for accuracy and completeness, and I agree with the above.  Kelby Fam. Whitney Muse, MD

## 2014-08-03 NOTE — Patient Instructions (Addendum)
[place] [facility] Discharge Instructions  RECOMMENDATIONS MADE BY THE CONSULTANT AND ANY TEST RESULTS WILL BE SENT TO YOUR REFERRING PHYSICIAN.  Exam completed by Dr Whitney Muse today Return in 1 month to see the doctor.  Please call the clinic if you have any questions or concerns.  Thank you for choosing [facility] to provide your oncology and hematology care.  To afford each patient quality time with our provider, please arrive at least 15 minutes before your scheduled appointment time.    You need to re-schedule your appointment should you arrive 10 or more minutes late.  We strive to give you quality time with our providers, and arriving late affects you and other patients whose appointments are after yours.  Also, if you no show three or more times for appointments you may be dismissed from the clinic at the providers discretion.     Again, thank you for choosing [facility].  Our hope is that these requests will decrease the amount of time that you wait before being seen by our physicians.       _____________________________________________________________  Should you have questions after your visit to [facility], please contact our office at [phone] between the hours of 8:30 a.m. and 4:30 p.m.  Voicemails left after 4:30 p.m. will not be returned until the following business day.  For prescription refill requests, have your pharmacy contact our office.

## 2014-08-08 ENCOUNTER — Ambulatory Visit: Payer: Medicare Other | Admitting: Urology

## 2014-08-10 ENCOUNTER — Other Ambulatory Visit: Payer: Self-pay | Admitting: *Deleted

## 2014-08-10 ENCOUNTER — Telehealth: Payer: Self-pay | Admitting: Cardiology

## 2014-08-10 ENCOUNTER — Other Ambulatory Visit (HOSPITAL_COMMUNITY): Payer: Self-pay | Admitting: Interventional Radiology

## 2014-08-10 ENCOUNTER — Ambulatory Visit
Admission: RE | Admit: 2014-08-10 | Discharge: 2014-08-10 | Disposition: A | Discharge status: Home / Self Care | Payer: Medicare Other | Source: Ambulatory Visit | Attending: Hematology & Oncology | Admitting: Hematology & Oncology

## 2014-08-10 DIAGNOSIS — K769 Liver disease, unspecified: Secondary | ICD-10-CM

## 2014-08-10 DIAGNOSIS — R160 Hepatomegaly, not elsewhere classified: Secondary | ICD-10-CM | POA: Diagnosis not present

## 2014-08-10 DIAGNOSIS — N289 Disorder of kidney and ureter, unspecified: Secondary | ICD-10-CM

## 2014-08-10 HISTORY — DX: Unspecified visual disturbance: H53.9

## 2014-08-10 NOTE — Telephone Encounter (Signed)
Request for surgical clearance:  1. What type of surgery is being performed? Microwave ablation under anesthesia from 1-2 Hours   2. When is this surgery scheduled? Working for the end of July   3. Are there any medications that need to be held prior to surgery and how long? Not sure.   4. Name of physician performing surgery? Dr. Laurence Ferrari  5. What is your office phone and fax number? [phone] Fax  And [phone] Phone 6. Will need a written Clearance

## 2014-08-10 NOTE — Telephone Encounter (Signed)
We can clear her for this procedure from a cardiac standpoint.

## 2014-08-10 NOTE — Consult Note (Signed)
Chief Complaint: Probable colon cancer metastasized to liver  Referring Physician(s): Penland,Shannon K  History of Present Illness: Karen [name] is a 81 y.o. female with a history of colon cancer both remotely in the 1980s, and 6 years ago in 2010. She underwent primary resection and now has a permanent colostomy. She has had no prior chemotherapy or radiation therapy. Additionally, she has a history of bladder cancer and underwent a surgery which she says was curative.  She presents today at the kind referral of Dr. Ancil Linsey to discuss possible minimally invasive options for a new left hepatic lesion in the setting of a rising CEA.  Currently, she is asymptomatic with regard to her small liver lesion. She has no nausea, vomiting or abdominal pain. On review of systems, she has multiple issues with chronic back and knee pain and suffers from fibromyalgia. She has had multiple issues with a small bowel obstructions in the past. She notes that her surgeon, Dr. Zella Richer, has told her in the past that she would never survive another operation.   Past Medical History  Diagnosis Date  . Chronic diastolic CHF (congestive heart failure)     a. nl EF by echo 05/2009.  Marland Kitchen DVT of deep femoral vein   . Palpitations   . History of PSVT (paroxysmal supraventricular tachycardia)   . LBBB (left bundle branch block)   . Hypertension   . Abdominal adhesions   . Ileostomy present   . Colon polyp   . Chest pain     a. 2002 Cath: nl cors;  b. 2009 aden mv: nl;  c. 05/2009 Echo: nl.  . Wears dentures   . Breast lump   . Hyperlipidemia   . Thyroid disease   . Anemia   . History of blood clots   . Hernia   . Blood transfusion   . Hearing loss   . Arthritis   . GERD (gastroesophageal reflux disease)   . Cancer     bladder  . Colon cancer     x 2. Last 2010  . Iron deficiency anemia due to chronic blood loss 07/06/2013  . UTI (urinary tract infection)   . Obstruction of intestine or  colon 06/2014  . Shortness of breath dyspnea     at times  . Diabetes mellitus without complication 0/86/76    borderline type II; takes no medication for it  . Vision abnormalities 2016    not going blind but having retina problems; might lose ability to see faces    Past Surgical History  Procedure Laterality Date  . Cardiac catheterization  12/10/2000    THE LEFT VENTRICLE IS MILDY DILATED. THERE IS MILD TO MODERATE DIFFUSE HYPOKINESIS WITH EF 35%  . Colon cancer surgery    . Ileostomy    . Esophagogastroduodenoscopy  02/13/2011    Procedure: ESOPHAGOGASTRODUODENOSCOPY (EGD);  Surgeon: Rogene Houston, MD;  Location: [facility];  Service: Endoscopy;  Laterality: N/A;  1030  . Colonoscopy  09/26/2011    Procedure: COLONOSCOPY;  Surgeon: Rogene Houston, MD;  Location: [facility];  Service: Endoscopy;  Laterality: N/A;  215  . Cataract extraction w/phaco  11/13/2011    Procedure: CATARACT EXTRACTION PHACO AND INTRAOCULAR LENS PLACEMENT (IOC);  Surgeon: Tonny Branch, MD;  Location: [facility];  Service: Ophthalmology;  Laterality: Right;  CDE 12.26  . Cataract extraction w/phaco  12/08/2011    Procedure: CATARACT EXTRACTION PHACO AND INTRAOCULAR LENS PLACEMENT (IOC);  Surgeon: Tonny Branch,  MD;  Location: [facility];  Service: Ophthalmology;  Laterality: Left;  CDE 15.11  . Esophagogastroduodenoscopy N/A 09/01/2013    Procedure: ESOPHAGOGASTRODUODENOSCOPY (EGD);  Surgeon: Rogene Houston, MD;  Location: [facility];  Service: Endoscopy;  Laterality: N/A;  830  . Botox injection N/A 09/01/2013    Procedure: BOTOX INJECTION;  Surgeon: Rogene Houston, MD;  Location: [facility];  Service: Endoscopy;  Laterality: N/A;  . Abdominal hysterectomy    . Cholecystectomy    . Joint replacement Left 2008  . Tonsillectomy    . Colon surgery    . Eye surgery  2014    catarac surgery  . Appendectomy      Allergies: Bactrim; Codeine; Demerol; Lidocaine hcl; Morphine and related; Nitrofurantoin monohyd  macro; Lisinopril; and Ramipril  Medications: Prior to Admission medications   Medication Sig Start Date End Date Taking? Authorizing Provider  ALPRAZolam Duanne Moron) 0.5 MG tablet Take 0.5 mg by mouth every 6 (six) hours as needed.    Yes Historical Provider, MD  amitriptyline (ELAVIL) 10 MG tablet Take 20 mg by mouth at bedtime.    Yes Historical Provider, MD  aspirin EC 325 MG tablet Take 325 mg by mouth daily.   Yes Historical Provider, MD  bifidobacterium infantis (ALIGN) capsule Take 1 capsule by mouth daily. 08/22/13  Yes Rogene Houston, MD  carvedilol (COREG) 12.5 MG tablet Take 1 tablet (12.5 mg total) by mouth 2 (two) times daily with a meal. 12/02/13  Yes Darlin Coco, MD  digoxin (LANOXIN) 0.125 MG tablet TAKE ONE TABLET BY MOUTH ONCE DAILY 12/02/13  Yes Darlin Coco, MD  HYDROcodone-acetaminophen (NORCO) 7.5-325 MG per tablet Take 1 tablet by mouth every 6 (six) hours as needed for moderate pain.   Yes Historical Provider, MD  hyoscyamine (LEVSIN SL) 0.125 MG SL tablet Place 0.125 mg under the tongue every 4 (four) hours as needed (lower abdominal pain).   Yes Historical Provider, MD  ketoconazole (NIZORAL) 2 % cream Apply 1 application topically 2 (two) times daily. Apply to feet twice daily 06/01/14  Yes Historical Provider, MD  levothyroxine (SYNTHROID, LEVOTHROID) 75 MCG tablet Take 75 mcg by mouth daily.    Yes Historical Provider, MD  losartan-hydrochlorothiazide (HYZAAR) 100-12.5 MG per tablet Take 1 tablet by mouth daily.   Yes Historical Provider, MD  Multiple Vitamins-Minerals (EQL GUMMY ADULT) CHEW Chew 2 tablets by mouth daily.    Yes Historical Provider, MD  NITROSTAT 0.4 MG SL tablet DISSOLVE ONE TABLET UNDER THE TONGUE EVERY 5 MINUTES AS NEEDED FOR CHEST PAIN.  DO NOT EXCEED A TOTAL OF 3 DOSES IN 15 MINUTES 11/15/13  Yes Darlin Coco, MD  Omega-3 Fatty Acids (FISH OIL) 1000 MG CAPS Take 1 capsule by mouth daily.   Yes Historical Provider, MD  ondansetron  (ZOFRAN-ODT) 4 MG disintegrating tablet Take 1 tablet (4 mg total) by mouth every 8 (eight) hours as needed for nausea or vomiting. 07/11/13  Yes Davonna Belling, MD  pantoprazole (PROTONIX) 40 MG tablet Take 40 mg by mouth 2 (two) times daily.   Yes Historical Provider, MD  triamcinolone lotion (KENALOG) 0.1 % Apply 1 application topically every 8 (eight) hours. 06/28/14  Yes Historical Provider, MD  mesalamine (CANASA) 1000 MG suppository Place 1,000 mg rectally at bedtime.    Historical Provider, MD     Family History  Problem Relation Age of Onset  . Stroke    . Hypertension    . Heart disease Mother   . Stroke  Mother     deceased  . Cancer Mother     bladder  . Arthritis Mother   . Arthritis Father   . Heart disease Father     decesaed  . Cancer Father     leukemia  . Stroke Sister     alive/debilitated  . Hypertension Sister   . Other Sister     paralysis  . Heart disease Brother     bypass surgery  . Cancer Brother   . Arthritis Brother   . Stroke Sister     alive/debilitated  . Diabetes Sister   . Other Brother     stomach problems  . Other Brother     bladder    History   Social History  . Marital Status: Married    Spouse Name: N/A  . Number of Children: N/A  . Years of Education: N/A   Social History Main Topics  . Smoking status: Former Smoker    Quit date: 02/11/1963  . Smokeless tobacco: Never Used  . Alcohol Use: No  . Drug Use: No  . Sexual Activity: Not on file   Other Topics Concern  . None   Social History Narrative    ECOG Status: 1 - Symptomatic but completely ambulatory  Review of Systems: A 12 point ROS discussed and pertinent positives are indicated in the HPI above.  All other systems are negative.  Review of Systems  Vital Signs: BP 119/54 mmHg  Pulse 66  Temp(Src) 97.9 F (36.6 C)  Resp 20  Ht 5' (1.524 m)  SpO2 96%  Physical Exam  Constitutional: She is oriented to person, place, and time. She appears  well-developed and well-nourished.  HENT:  Head: Normocephalic and atraumatic.  Eyes: No scleral icterus.  Cardiovascular: Normal rate.   Pulmonary/Chest: Effort normal.  Abdominal: Soft. There is no tenderness.  Neurological: She is alert and oriented to person, place, and time.  Skin: Skin is warm and dry.  Psychiatric: She has a normal mood and affect. Her behavior is normal.  Nursing note and vitals reviewed.   Mallampati Score:  2  Imaging: Ct Chest W Contrast  07/13/2014   CLINICAL DATA:  Elevated CEA levels. Colorectal carcinoma. Previous surgery. Evaluate for recurrent or metastatic disease.  EXAM: CT CHEST, ABDOMEN, AND PELVIS WITH CONTRAST  TECHNIQUE: Multidetector CT imaging of the chest, abdomen and pelvis was performed following the standard protocol during bolus administration of intravenous contrast.  CONTRAST:  70mL OMNIPAQUE IOHEXOL 300 MG/ML  SOLN  COMPARISON:  01/04/2013  FINDINGS: CT CHEST FINDINGS  Mediastinum/Lymph Nodes: No masses or pathologically enlarged lymph nodes identified. Small less than 1 cm mediastinal lymph nodes in the right paratracheal region and right cardiophrenic angle are stable and not pathologically enlarged.  Lungs/Pleura: No pulmonary infiltrate or mass identified. No effusion present.  Musculoskeletal/Soft Tissues: No suspicious bone lesions or other significant chest wall abnormality.  CT ABDOMEN AND PELVIS FINDINGS  Hepatobiliary: Hepatic steatosis again noted. A new 1.3 cm hypovascular mass is seen in segment 2 of the left hepatic lobe, consistent with liver metastasis. No other liver masses are identified. A tiny sub-cm cyst in the caudate lobe appears stable.  Pancreas: No mass, inflammatory changes, or other significant abnormality identified.  Spleen:  Within normal limits in size and appearance.  Adrenals:  No masses identified.  Kidneys/Urinary Tract: Small benign-appearing right renal cyst again noted. A subcapsular lesion in the anterior  lower pole left kidney shows mild increase in size, now measuring  1.2 cm compared to 1.0 cm previously. This shows possible contrast enhancement with washout on delayed imaging, suspicious for a solid renal neoplasm. No evidence of hydronephrosis.  Stomach/Bowel/Peritoneum: No evidence of wall thickening, mass, or obstruction. Stable postoperative changes from right colectomy and right lower quadrant ileostomy. Sigmoid diverticulosis is noted, without evidence of diverticulitis.  Vascular/Lymphatic: No pathologically enlarged lymph nodes identified. No other significant abnormality visualized.  Reproductive:  No mass or other significant abnormality identified.  Other: Left hip prosthesis causes significant beam hardening artifact obscuring the inferior pelvis.  Musculoskeletal:  No suspicious bone lesions identified.  IMPRESSION: New 1.3 cm hypovascular mass in segment 2 of the left hepatic lobe, consistent with liver metastasis.  No other sites of metastatic disease identified within the chest, abdomen, or pelvis.  Further mild enlargement of 1.2 cm subcapsular mass in the anterior lower pole of left kidney, suspicious for a solid renal neoplasm/renal cell carcinoma. Recommend abdomen MRI without and with contrast for further characterization. This recommendation follows ACR consensus guidelines: Managing Incidental Findings on Abdominal CT: White Paper of the ACR Incidental Findings Committee. [facility] [phone]   Electronically Signed   By: Earle Gell M.D.   On: 07/13/2014 12:40   Ct Abdomen Pelvis W Contrast  07/13/2014   CLINICAL DATA:  Elevated CEA levels. Colorectal carcinoma. Previous surgery. Evaluate for recurrent or metastatic disease.  EXAM: CT CHEST, ABDOMEN, AND PELVIS WITH CONTRAST  TECHNIQUE: Multidetector CT imaging of the chest, abdomen and pelvis was performed following the standard protocol during bolus administration of intravenous contrast.  CONTRAST:  74mL OMNIPAQUE IOHEXOL  300 MG/ML  SOLN  COMPARISON:  01/04/2013  FINDINGS: CT CHEST FINDINGS  Mediastinum/Lymph Nodes: No masses or pathologically enlarged lymph nodes identified. Small less than 1 cm mediastinal lymph nodes in the right paratracheal region and right cardiophrenic angle are stable and not pathologically enlarged.  Lungs/Pleura: No pulmonary infiltrate or mass identified. No effusion present.  Musculoskeletal/Soft Tissues: No suspicious bone lesions or other significant chest wall abnormality.  CT ABDOMEN AND PELVIS FINDINGS  Hepatobiliary: Hepatic steatosis again noted. A new 1.3 cm hypovascular mass is seen in segment 2 of the left hepatic lobe, consistent with liver metastasis. No other liver masses are identified. A tiny sub-cm cyst in the caudate lobe appears stable.  Pancreas: No mass, inflammatory changes, or other significant abnormality identified.  Spleen:  Within normal limits in size and appearance.  Adrenals:  No masses identified.  Kidneys/Urinary Tract: Small benign-appearing right renal cyst again noted. A subcapsular lesion in the anterior lower pole left kidney shows mild increase in size, now measuring 1.2 cm compared to 1.0 cm previously. This shows possible contrast enhancement with washout on delayed imaging, suspicious for a solid renal neoplasm. No evidence of hydronephrosis.  Stomach/Bowel/Peritoneum: No evidence of wall thickening, mass, or obstruction. Stable postoperative changes from right colectomy and right lower quadrant ileostomy. Sigmoid diverticulosis is noted, without evidence of diverticulitis.  Vascular/Lymphatic: No pathologically enlarged lymph nodes identified. No other significant abnormality visualized.  Reproductive:  No mass or other significant abnormality identified.  Other: Left hip prosthesis causes significant beam hardening artifact obscuring the inferior pelvis.  Musculoskeletal:  No suspicious bone lesions identified.  IMPRESSION: New 1.3 cm hypovascular mass in segment  2 of the left hepatic lobe, consistent with liver metastasis.  No other sites of metastatic disease identified within the chest, abdomen, or pelvis.  Further mild enlargement of 1.2 cm subcapsular mass in the anterior lower pole of left  kidney, suspicious for a solid renal neoplasm/renal cell carcinoma. Recommend abdomen MRI without and with contrast for further characterization. This recommendation follows ACR consensus guidelines: Managing Incidental Findings on Abdominal CT: White Paper of the ACR Incidental Findings Committee. [facility] [phone]   Electronically Signed   By: Earle Gell M.D.   On: 07/13/2014 12:40    Labs:  CBC:  Recent Labs  04/11/14 0921 05/23/14 1000 06/15/14 0355 06/30/14 1053  WBC 5.2 5.3 8.1 6.2  HGB 11.0* 12.0 12.8 11.4*  HCT 34.0* 36.5 39.4 34.5*  PLT 152 185 181 184    COAGS: No results for input(s): INR, APTT in the last 8760 hours.  BMP:  Recent Labs  12/30/13 1106 05/23/14 1000 06/15/14 0355 06/16/14 0659  NA 143 140 137 142  K 4.2 4.0 3.8 3.6  CL 103 103 101 104  CO2 30 27 27 29   GLUCOSE 102* 114* 134* 108*  BUN 26* 31* 20 21*  CALCIUM 9.5 9.2 9.5 8.9  CREATININE 1.26* 1.20* 1.24* 1.06*  GFRNONAA 39* 41* 39* 48*  GFRAA 45* 47* 46* 55*    LIVER FUNCTION TESTS:  Recent Labs  12/30/13 1106 05/23/14 1000 06/15/14 0355  BILITOT 0.4 0.5 0.7  AST 23 31 31   ALT 19 22 27   ALKPHOS 79 70 71  PROT 7.3 7.7 8.3*  ALBUMIN 3.6 3.9 4.3    TUMOR MARKERS:  Recent Labs  12/30/13 1106 06/30/14 1120  CEA 6.1* 35.5*    Assessment and Plan:  Very pleasant 81 year old female with a probable metastatic colon cancer to the left hepatic lobe and an indolent slow-growing neoplasm of the lower pole of the left kidney which may represent an oncocytoma or indolent RCC.  The hepatic lesion measures 1.3 cm and is new compared to her prior CT scan from 01/04/2013. Additionally, her CEA which was 6.1 in November 2015 is increased to  35.5 as of 06/30/2014.    Based on her discussions with her prior surgeon Dr. Zella Richer, she is almost certainly not a surgical candidate for either partial hepatectomy or partial nephrectomy.    Her 1.3 cm lesion within hepatic segment 2 would be easily amenable to percutaneous thermal ablation. There are no nearby critical structures and this would be a very straightforward and simple ablation with minimal anesthesia time. In fact, if anesthesia were considered too risky for her cardiac status this procedure could likely be accomplished with deep sedation.  Regarding her small renal lesion, this may have minimally increased from 11 to 12 mm over the 19 month period since her last CT scan on 01/04/2013. This is really very minimal growth. While this enhancing lesion is certainly likely a renal neoplasm, it it appears to be indolent and may represent an oncocytoma or very low-grade renal cell carcinoma. I agree that she should seek urological consultation for this lesion. I suspect they will opt to follow this for continued growth. If it continues to grow larger than 1.5 cm, I think this would also be amenable to percutaneous ablation.  Luckily, as we will be following her liver lesion closely we will at the same time be able to keep a close eye on this small probable renal neoplasm.  1.) I will reach out to her cardiologist office to obtain cardiac clearance for general anesthesia  2.) I will order an MRI of the abdomen with and without gadolinium contrast. This study will serve multiple purposes including thoroughly evaluating the liver for other small suspicious lesions  below the resolution of CT, serving as a baseline for future follow-up imaging after ablation, and further assessing the left renal lesion.  3.) I would like to arrange ultrasound-guided biopsy of the hepatic mass to be performed at the same time as the percutaneous thermal ablation. We will do a quick stain on the biopsy results to  ensure malignancy before proceeding with the ablation. We will of course also obtain a core specimen so that we can confirm the complete diagnosis as to the source of his metastatic lesion. Immediately following biopsy we will continue with microwave ablation once malignancy is confirmed.  Thank you for this interesting consult.  I greatly enjoyed meeting Eymi H Bornhorst and look forward to participating in their care.  SignedJacqulynn Cadet 08/10/2014, 9:27 AM   I spent a total of  30 Minutes  in face to face in clinical consultation, greater than 50% of which was counseling/coordinating care for putative liver metastasis.

## 2014-08-10 NOTE — Telephone Encounter (Signed)
Will forward to  Dr. Brackbill for review 

## 2014-08-10 NOTE — Telephone Encounter (Signed)
Faxed and confirmation received.

## 2014-08-15 DIAGNOSIS — K7689 Other specified diseases of liver: Secondary | ICD-10-CM | POA: Diagnosis not present

## 2014-08-16 ENCOUNTER — Telehealth (HOSPITAL_COMMUNITY): Payer: Self-pay

## 2014-08-16 LAB — CREATININE WITH EST GFR
Creat: 1.5 mg/dL — ABNORMAL HIGH (ref 0.50–1.10)
GFR, EST AFRICAN AMERICAN: 37 mL/min — AB
GFR, Est Non African American: 32 mL/min — ABNORMAL LOW

## 2014-08-16 LAB — BUN: BUN: 43 mg/dL — ABNORMAL HIGH (ref 6–23)

## 2014-08-16 NOTE — Telephone Encounter (Signed)
Call from daughter with questions regarding visit with Dr. Geroge Baseman.  Instructed that Dr. Garen Lah office will contact them with follow-up scheduling the biopsy.

## 2014-08-21 ENCOUNTER — Other Ambulatory Visit: Payer: Self-pay | Admitting: Interventional Radiology

## 2014-08-21 DIAGNOSIS — C787 Secondary malignant neoplasm of liver and intrahepatic bile duct: Secondary | ICD-10-CM

## 2014-08-23 ENCOUNTER — Ambulatory Visit (HOSPITAL_COMMUNITY)
Admission: RE | Admit: 2014-08-23 | Discharge: 2014-08-23 | Disposition: A | Discharge status: Home / Self Care | Payer: Medicare Other | Source: Ambulatory Visit | Attending: Interventional Radiology | Admitting: Interventional Radiology

## 2014-08-23 DIAGNOSIS — K760 Fatty (change of) liver, not elsewhere classified: Secondary | ICD-10-CM | POA: Diagnosis not present

## 2014-08-23 DIAGNOSIS — Z85048 Personal history of other malignant neoplasm of rectum, rectosigmoid junction, and anus: Secondary | ICD-10-CM | POA: Diagnosis not present

## 2014-08-23 DIAGNOSIS — K769 Liver disease, unspecified: Secondary | ICD-10-CM

## 2014-08-23 DIAGNOSIS — N289 Disorder of kidney and ureter, unspecified: Secondary | ICD-10-CM

## 2014-08-23 DIAGNOSIS — R970 Elevated carcinoembryonic antigen [CEA]: Secondary | ICD-10-CM | POA: Insufficient documentation

## 2014-08-24 DIAGNOSIS — M4316 Spondylolisthesis, lumbar region: Secondary | ICD-10-CM | POA: Diagnosis not present

## 2014-08-24 DIAGNOSIS — M47816 Spondylosis without myelopathy or radiculopathy, lumbar region: Secondary | ICD-10-CM | POA: Diagnosis not present

## 2014-08-24 DIAGNOSIS — M545 Low back pain: Secondary | ICD-10-CM | POA: Diagnosis not present

## 2014-08-24 DIAGNOSIS — M25551 Pain in right hip: Secondary | ICD-10-CM | POA: Diagnosis not present

## 2014-08-25 ENCOUNTER — Other Ambulatory Visit: Payer: Self-pay | Admitting: Radiology

## 2014-08-25 NOTE — Progress Notes (Signed)
Requested orders procedure 09-01-14 pre op 08-30-14 Thanks

## 2014-08-28 ENCOUNTER — Encounter (HOSPITAL_COMMUNITY): Payer: Self-pay

## 2014-08-28 ENCOUNTER — Encounter: Payer: Self-pay | Admitting: Genetic Counselor

## 2014-08-29 ENCOUNTER — Ambulatory Visit (INDEPENDENT_AMBULATORY_CARE_PROVIDER_SITE_OTHER): Payer: Medicare Other | Admitting: Urology

## 2014-08-29 DIAGNOSIS — N393 Stress incontinence (female) (male): Secondary | ICD-10-CM | POA: Diagnosis not present

## 2014-08-29 DIAGNOSIS — C679 Malignant neoplasm of bladder, unspecified: Secondary | ICD-10-CM | POA: Diagnosis not present

## 2014-08-29 DIAGNOSIS — N390 Urinary tract infection, site not specified: Secondary | ICD-10-CM | POA: Diagnosis not present

## 2014-08-29 DIAGNOSIS — D495 Neoplasm of unspecified behavior of other genitourinary organs: Secondary | ICD-10-CM

## 2014-08-29 NOTE — Patient Instructions (Addendum)
Karen [name]  08/29/2014   Your procedure is scheduled on: September 01, 2014  Report to [facility] Main  Entrance and check in at Radiology at 10:00 AM.  Then you will go the the [facility]  to Short Stay on the third floor.                     Call this number if you have problems the morning of surgery [phone]   Remember: ONLY 1 PERSON MAY GO WITH YOU TO SHORT STAY TO GET  READY MORNING OF YOUR SURGERY.  Do not eat food or drink liquids :After Midnight.     Take these medicines the morning of surgery with A SIP OF WATER:  Xanax, if needed, Coreg (carvedilol), Levothroxine, Protonix , Digoxin                             You may not have any metal on your body including hair pins and              piercings  Do not wear jewelry, make-up, lotions, powders or perfumes, deodorant             Do not wear nail polish.  Do not shave  48 hours prior to surgery.                Do not bring valuables to the hospital. [place].  Contacts, dentures or bridgework may not be worn into surgery.  Leave suitcase in the car. After surgery it may be brought to your room.       Special Instructions: coughing and deep breathing exercises, leg exercises              Please read over the following fact sheets you were given: _____________________________________________________________________             [facility] - Preparing for Surgery Before surgery, you can play an important role.  Because skin is not sterile, your skin needs to be as free of germs as possible.  You can reduce the number of germs on your skin by washing with CHG (chlorahexidine gluconate) soap before surgery.  CHG is an antiseptic cleaner which kills germs and bonds with the skin to continue killing germs even after washing. Please DO NOT use if you have an allergy to CHG or antibacterial soaps.  If your skin becomes reddened/irritated stop using  the CHG and inform your nurse when you arrive at Short Stay. Do not shave (including legs and underarms) for at least 48 hours prior to the first CHG shower.  You may shave your face/neck. Please follow these instructions carefully:  1.  Shower with CHG Soap the night before surgery and the  morning of Surgery.  2.  If you choose to wash your hair, wash your hair first as usual with your  normal  shampoo.  3.  After you shampoo, rinse your hair and body thoroughly to remove the  shampoo.                           4.  Use CHG as you would any other liquid soap.  You can apply chg directly  to  the skin and wash                       Gently with a scrungie or clean washcloth.  5.  Apply the CHG Soap to your body ONLY FROM THE NECK DOWN.   Do not use on face/ open                           Wound or open sores. Avoid contact with eyes, ears mouth and genitals (private parts).                       Wash face,  Genitals (private parts) with your normal soap.             6.  Wash thoroughly, paying special attention to the area where your surgery  will be performed.  7.  Thoroughly rinse your body with warm water from the neck down.  8.  DO NOT shower/wash with your normal soap after using and rinsing off  the CHG Soap.                9.  Pat yourself dry with a clean towel.            10.  Wear clean pajamas.            11.  Place clean sheets on your bed the night of your first shower and do not  sleep with pets. Day of Surgery : Do not apply any lotions/deodorants the morning of surgery.  Please wear clean clothes to the hospital/surgery center.  FAILURE TO FOLLOW THESE INSTRUCTIONS MAY RESULT IN THE CANCELLATION OF YOUR SURGERY PATIENT SIGNATURE_________________________________  NURSE SIGNATURE__________________________________  ________________________________________________________________________  WHAT IS A BLOOD TRANSFUSION? Blood Transfusion Information  A transfusion is the replacement  of blood or some of its parts. Blood is made up of multiple cells which provide different functions.  Red blood cells carry oxygen and are used for blood loss replacement.  White blood cells fight against infection.  Platelets control bleeding.  Plasma helps clot blood.  Other blood products are available for specialized needs, such as hemophilia or other clotting disorders. BEFORE THE TRANSFUSION  Who gives blood for transfusions?   Healthy volunteers who are fully evaluated to make sure their blood is safe. This is blood bank blood. Transfusion therapy is the safest it has ever been in the practice of medicine. Before blood is taken from a donor, a complete history is taken to make sure that person has no history of diseases nor engages in risky social behavior (examples are intravenous drug use or sexual activity with multiple partners). The donor's travel history is screened to minimize risk of transmitting infections, such as malaria. The donated blood is tested for signs of infectious diseases, such as HIV and hepatitis. The blood is then tested to be sure it is compatible with you in order to minimize the chance of a transfusion reaction. If you or a relative donates blood, this is often done in anticipation of surgery and is not appropriate for emergency situations. It takes many days to process the donated blood. RISKS AND COMPLICATIONS Although transfusion therapy is very safe and saves many lives, the main dangers of transfusion include:   Getting an infectious disease.  Developing a transfusion reaction. This is an allergic reaction to something in the blood you were given. Every precaution is taken to prevent this.  The decision to have a blood transfusion has been considered carefully by your caregiver before blood is given. Blood is not given unless the benefits outweigh the risks. AFTER THE TRANSFUSION  Right after receiving a blood transfusion, you will usually feel much  better and more energetic. This is especially true if your red blood cells have gotten low (anemic). The transfusion raises the level of the red blood cells which carry oxygen, and this usually causes an energy increase.  The nurse administering the transfusion will monitor you carefully for complications. HOME CARE INSTRUCTIONS  No special instructions are needed after a transfusion. You may find your energy is better. Speak with your caregiver about any limitations on activity for underlying diseases you may have. SEEK MEDICAL CARE IF:   Your condition is not improving after your transfusion.  You develop redness or irritation at the intravenous (IV) site. SEEK IMMEDIATE MEDICAL CARE IF:  Any of the following symptoms occur over the next 12 hours:  Shaking chills.  You have a temperature by mouth above 102 F (38.9 C), not controlled by medicine.  Chest, back, or muscle pain.  People around you feel you are not acting correctly or are confused.  Shortness of breath or difficulty breathing.  Dizziness and fainting.  You get a rash or develop hives.  You have a decrease in urine output.  Your urine turns a dark color or changes to pink, red, or brown. Any of the following symptoms occur over the next 10 days:  You have a temperature by mouth above 102 F (38.9 C), not controlled by medicine.  Shortness of breath.  Weakness after normal activity.  The white part of the eye turns yellow (jaundice).  You have a decrease in the amount of urine or are urinating less often.  Your urine turns a dark color or changes to pink, red, or brown. Document Released: 01/25/2000 Document Revised: 04/21/2011 Document Reviewed: 09/13/2007 [facility] Patient Information 2014 [place], [place].  _______________________________________________________________________

## 2014-08-30 ENCOUNTER — Encounter (HOSPITAL_COMMUNITY): Payer: Self-pay

## 2014-08-30 ENCOUNTER — Encounter (HOSPITAL_COMMUNITY)
Admission: RE | Admit: 2014-08-30 | Discharge: 2014-08-30 | Disposition: A | Discharge status: Home / Self Care | Payer: Medicare Other | Source: Ambulatory Visit | Attending: Interventional Radiology | Admitting: Interventional Radiology

## 2014-08-30 ENCOUNTER — Other Ambulatory Visit: Payer: Self-pay

## 2014-08-30 DIAGNOSIS — Z87440 Personal history of urinary (tract) infections: Secondary | ICD-10-CM | POA: Diagnosis not present

## 2014-08-30 DIAGNOSIS — Z79899 Other long term (current) drug therapy: Secondary | ICD-10-CM | POA: Diagnosis not present

## 2014-08-30 DIAGNOSIS — E785 Hyperlipidemia, unspecified: Secondary | ICD-10-CM | POA: Diagnosis not present

## 2014-08-30 DIAGNOSIS — I129 Hypertensive chronic kidney disease with stage 1 through stage 4 chronic kidney disease, or unspecified chronic kidney disease: Secondary | ICD-10-CM | POA: Diagnosis not present

## 2014-08-30 DIAGNOSIS — I471 Supraventricular tachycardia: Secondary | ICD-10-CM | POA: Diagnosis not present

## 2014-08-30 DIAGNOSIS — D649 Anemia, unspecified: Secondary | ICD-10-CM | POA: Diagnosis not present

## 2014-08-30 DIAGNOSIS — M797 Fibromyalgia: Secondary | ICD-10-CM | POA: Diagnosis not present

## 2014-08-30 DIAGNOSIS — I447 Left bundle-branch block, unspecified: Secondary | ICD-10-CM | POA: Diagnosis not present

## 2014-08-30 DIAGNOSIS — E119 Type 2 diabetes mellitus without complications: Secondary | ICD-10-CM | POA: Diagnosis not present

## 2014-08-30 DIAGNOSIS — C787 Secondary malignant neoplasm of liver and intrahepatic bile duct: Secondary | ICD-10-CM | POA: Diagnosis not present

## 2014-08-30 DIAGNOSIS — C189 Malignant neoplasm of colon, unspecified: Secondary | ICD-10-CM | POA: Diagnosis present

## 2014-08-30 DIAGNOSIS — F329 Major depressive disorder, single episode, unspecified: Secondary | ICD-10-CM | POA: Diagnosis not present

## 2014-08-30 DIAGNOSIS — Z87891 Personal history of nicotine dependence: Secondary | ICD-10-CM | POA: Diagnosis not present

## 2014-08-30 DIAGNOSIS — I5032 Chronic diastolic (congestive) heart failure: Secondary | ICD-10-CM | POA: Diagnosis not present

## 2014-08-30 DIAGNOSIS — N189 Chronic kidney disease, unspecified: Secondary | ICD-10-CM | POA: Diagnosis not present

## 2014-08-30 DIAGNOSIS — Z86010 Personal history of colonic polyps: Secondary | ICD-10-CM | POA: Diagnosis not present

## 2014-08-30 DIAGNOSIS — K219 Gastro-esophageal reflux disease without esophagitis: Secondary | ICD-10-CM | POA: Diagnosis not present

## 2014-08-30 DIAGNOSIS — E079 Disorder of thyroid, unspecified: Secondary | ICD-10-CM | POA: Diagnosis not present

## 2014-08-30 HISTORY — DX: Fibromyalgia: M79.7

## 2014-08-30 HISTORY — DX: Headache: R51

## 2014-08-30 HISTORY — DX: Major depressive disorder, single episode, unspecified: F32.9

## 2014-08-30 HISTORY — DX: Headache, unspecified: R51.9

## 2014-08-30 HISTORY — DX: Depression, unspecified: F32.A

## 2014-08-30 LAB — CBC WITH DIFFERENTIAL/PLATELET
BASOS ABS: 0 10*3/uL (ref 0.0–0.1)
BASOS PCT: 0 % (ref 0–1)
Eosinophils Absolute: 0.1 10*3/uL (ref 0.0–0.7)
Eosinophils Relative: 3 % (ref 0–5)
HCT: 33.6 % — ABNORMAL LOW (ref 36.0–46.0)
HEMOGLOBIN: 11 g/dL — AB (ref 12.0–15.0)
Lymphocytes Relative: 19 % (ref 12–46)
Lymphs Abs: 1 10*3/uL (ref 0.7–4.0)
MCH: 29.4 pg (ref 26.0–34.0)
MCHC: 32.7 g/dL (ref 30.0–36.0)
MCV: 89.8 fL (ref 78.0–100.0)
MONOS PCT: 10 % (ref 3–12)
Monocytes Absolute: 0.6 10*3/uL (ref 0.1–1.0)
Neutro Abs: 3.8 10*3/uL (ref 1.7–7.7)
Neutrophils Relative %: 68 % (ref 43–77)
Platelets: 159 10*3/uL (ref 150–400)
RBC: 3.74 MIL/uL — ABNORMAL LOW (ref 3.87–5.11)
RDW: 13.1 % (ref 11.5–15.5)
WBC: 5.6 10*3/uL (ref 4.0–10.5)

## 2014-08-30 LAB — COMPREHENSIVE METABOLIC PANEL
ALT: 18 U/L (ref 14–54)
AST: 22 U/L (ref 15–41)
Albumin: 3.8 g/dL (ref 3.5–5.0)
Alkaline Phosphatase: 65 U/L (ref 38–126)
Anion gap: 6 (ref 5–15)
BUN: 31 mg/dL — ABNORMAL HIGH (ref 6–20)
CALCIUM: 9.6 mg/dL (ref 8.9–10.3)
CHLORIDE: 109 mmol/L (ref 101–111)
CO2: 26 mmol/L (ref 22–32)
Creatinine, Ser: 1.66 mg/dL — ABNORMAL HIGH (ref 0.44–1.00)
GFR calc Af Amer: 32 mL/min — ABNORMAL LOW (ref >60)
GFR calc non Af Amer: 28 mL/min — ABNORMAL LOW (ref >60)
GLUCOSE: 120 mg/dL — AB (ref 65–99)
POTASSIUM: 5 mmol/L (ref 3.5–5.1)
Sodium: 141 mmol/L (ref 135–145)
Total Bilirubin: 0.5 mg/dL (ref 0.3–1.2)
Total Protein: 7.3 g/dL (ref 6.5–8.1)

## 2014-08-30 LAB — PROTIME-INR
INR: 1.12 (ref 0.00–1.49)
Prothrombin Time: 14.6 seconds (ref 11.6–15.2)

## 2014-08-30 LAB — APTT: aPTT: 34 seconds (ref 24–37)

## 2014-08-30 NOTE — Progress Notes (Signed)
08-30-14 - CMP lab results from preop visit (08-30-14) faxed to Dr. Laurence Ferrari via [facility]

## 2014-08-30 NOTE — Progress Notes (Signed)
08-10-14 - Surgical clearance - telephone note - Dr. Mare Ferrari (cardio) EPIC 08-03-14 - LOV - Dr. Whitney Muse (oncology) - EPIC 07-13-14 - CT Abd/Pelvis - EPIC 07-13-14 - CT chest W Contrast - EPIC 06-16-14 UA - EPIC 06-06-14 - LOV - Dr. Mare Ferrari - EPIC  08-23-14 - MRI- Abd Wo contrast - EPIC  12-02-13 - EKG - EPIC 06-03-13 - Stress Test - EPIC

## 2014-08-31 ENCOUNTER — Other Ambulatory Visit: Payer: Self-pay | Admitting: Radiology

## 2014-08-31 NOTE — Progress Notes (Signed)
Final EKG in EPIC done 08/30/14.

## 2014-09-01 ENCOUNTER — Other Ambulatory Visit: Payer: Self-pay

## 2014-09-01 ENCOUNTER — Ambulatory Visit (HOSPITAL_COMMUNITY)
Admission: RE | Admit: 2014-09-01 | Discharge: 2014-09-01 | Disposition: A | Discharge status: Home / Self Care | Payer: Medicare Other | Source: Ambulatory Visit | Attending: Interventional Radiology | Admitting: Interventional Radiology

## 2014-09-01 ENCOUNTER — Encounter (HOSPITAL_COMMUNITY): Payer: Self-pay | Admitting: *Deleted

## 2014-09-01 ENCOUNTER — Ambulatory Visit (HOSPITAL_COMMUNITY): Payer: Medicare Other | Admitting: Certified Registered Nurse Anesthetist

## 2014-09-01 ENCOUNTER — Ambulatory Visit (HOSPITAL_COMMUNITY)
Admission: RE | Admit: 2014-09-01 | Discharge: 2014-09-02 | Disposition: A | Discharge status: Home / Self Care | Payer: Medicare Other | Source: Ambulatory Visit | Attending: Interventional Radiology | Admitting: Interventional Radiology

## 2014-09-01 ENCOUNTER — Ambulatory Visit (HOSPITAL_COMMUNITY): Payer: Medicare Other

## 2014-09-01 ENCOUNTER — Encounter (HOSPITAL_COMMUNITY)
Admission: RE | Disposition: A | Discharge status: Home / Self Care | Payer: Self-pay | Source: Ambulatory Visit | Attending: Interventional Radiology

## 2014-09-01 ENCOUNTER — Encounter (HOSPITAL_COMMUNITY): Payer: Self-pay

## 2014-09-01 DIAGNOSIS — K219 Gastro-esophageal reflux disease without esophagitis: Secondary | ICD-10-CM | POA: Insufficient documentation

## 2014-09-01 DIAGNOSIS — E785 Hyperlipidemia, unspecified: Secondary | ICD-10-CM | POA: Insufficient documentation

## 2014-09-01 DIAGNOSIS — R079 Chest pain, unspecified: Secondary | ICD-10-CM | POA: Diagnosis not present

## 2014-09-01 DIAGNOSIS — C189 Malignant neoplasm of colon, unspecified: Secondary | ICD-10-CM | POA: Diagnosis not present

## 2014-09-01 DIAGNOSIS — Z87891 Personal history of nicotine dependence: Secondary | ICD-10-CM | POA: Insufficient documentation

## 2014-09-01 DIAGNOSIS — Z79899 Other long term (current) drug therapy: Secondary | ICD-10-CM | POA: Insufficient documentation

## 2014-09-01 DIAGNOSIS — E119 Type 2 diabetes mellitus without complications: Secondary | ICD-10-CM | POA: Insufficient documentation

## 2014-09-01 DIAGNOSIS — C787 Secondary malignant neoplasm of liver and intrahepatic bile duct: Secondary | ICD-10-CM

## 2014-09-01 DIAGNOSIS — I5032 Chronic diastolic (congestive) heart failure: Secondary | ICD-10-CM | POA: Diagnosis not present

## 2014-09-01 DIAGNOSIS — I509 Heart failure, unspecified: Secondary | ICD-10-CM | POA: Diagnosis not present

## 2014-09-01 DIAGNOSIS — I447 Left bundle-branch block, unspecified: Secondary | ICD-10-CM | POA: Insufficient documentation

## 2014-09-01 DIAGNOSIS — N189 Chronic kidney disease, unspecified: Secondary | ICD-10-CM | POA: Insufficient documentation

## 2014-09-01 DIAGNOSIS — I129 Hypertensive chronic kidney disease with stage 1 through stage 4 chronic kidney disease, or unspecified chronic kidney disease: Secondary | ICD-10-CM | POA: Diagnosis not present

## 2014-09-01 DIAGNOSIS — C229 Malignant neoplasm of liver, not specified as primary or secondary: Secondary | ICD-10-CM | POA: Diagnosis not present

## 2014-09-01 DIAGNOSIS — D649 Anemia, unspecified: Secondary | ICD-10-CM | POA: Insufficient documentation

## 2014-09-01 DIAGNOSIS — I471 Supraventricular tachycardia: Secondary | ICD-10-CM | POA: Diagnosis not present

## 2014-09-01 DIAGNOSIS — K769 Liver disease, unspecified: Secondary | ICD-10-CM | POA: Diagnosis not present

## 2014-09-01 DIAGNOSIS — M797 Fibromyalgia: Secondary | ICD-10-CM | POA: Insufficient documentation

## 2014-09-01 DIAGNOSIS — E079 Disorder of thyroid, unspecified: Secondary | ICD-10-CM | POA: Insufficient documentation

## 2014-09-01 DIAGNOSIS — Z86010 Personal history of colonic polyps: Secondary | ICD-10-CM | POA: Insufficient documentation

## 2014-09-01 DIAGNOSIS — Z85038 Personal history of other malignant neoplasm of large intestine: Secondary | ICD-10-CM | POA: Diagnosis not present

## 2014-09-01 DIAGNOSIS — C801 Malignant (primary) neoplasm, unspecified: Secondary | ICD-10-CM | POA: Diagnosis not present

## 2014-09-01 DIAGNOSIS — Z87440 Personal history of urinary (tract) infections: Secondary | ICD-10-CM | POA: Insufficient documentation

## 2014-09-01 DIAGNOSIS — F329 Major depressive disorder, single episode, unspecified: Secondary | ICD-10-CM | POA: Insufficient documentation

## 2014-09-01 HISTORY — PX: OTHER SURGICAL HISTORY: SHX169

## 2014-09-01 LAB — RENAL FUNCTION PANEL
Albumin: 3.6 g/dL (ref 3.5–5.0)
Anion gap: 8 (ref 5–15)
BUN: 31 mg/dL — AB (ref 6–20)
CO2: 26 mmol/L (ref 22–32)
CREATININE: 1.3 mg/dL — AB (ref 0.44–1.00)
Calcium: 9.1 mg/dL (ref 8.9–10.3)
Chloride: 107 mmol/L (ref 101–111)
GFR calc Af Amer: 43 mL/min — ABNORMAL LOW (ref >60)
GFR, EST NON AFRICAN AMERICAN: 37 mL/min — AB (ref >60)
Glucose, Bld: 141 mg/dL — ABNORMAL HIGH (ref 65–99)
Phosphorus: 4.1 mg/dL (ref 2.5–4.6)
Potassium: 4.3 mmol/L (ref 3.5–5.1)
Sodium: 141 mmol/L (ref 135–145)

## 2014-09-01 LAB — CBC WITH DIFFERENTIAL/PLATELET
Basophils Absolute: 0 10*3/uL (ref 0.0–0.1)
Basophils Relative: 0 % (ref 0–1)
Eosinophils Absolute: 0 10*3/uL (ref 0.0–0.7)
Eosinophils Relative: 0 % (ref 0–5)
HEMATOCRIT: 34.4 % — AB (ref 36.0–46.0)
Hemoglobin: 11.2 g/dL — ABNORMAL LOW (ref 12.0–15.0)
Lymphocytes Relative: 8 % — ABNORMAL LOW (ref 12–46)
Lymphs Abs: 0.6 10*3/uL — ABNORMAL LOW (ref 0.7–4.0)
MCH: 29.2 pg (ref 26.0–34.0)
MCHC: 32.6 g/dL (ref 30.0–36.0)
MCV: 89.6 fL (ref 78.0–100.0)
Monocytes Absolute: 0.4 10*3/uL (ref 0.1–1.0)
Monocytes Relative: 6 % (ref 3–12)
NEUTROS PCT: 86 % — AB (ref 43–77)
Neutro Abs: 5.8 10*3/uL (ref 1.7–7.7)
Platelets: 127 10*3/uL — ABNORMAL LOW (ref 150–400)
RBC: 3.84 MIL/uL — AB (ref 3.87–5.11)
RDW: 13.1 % (ref 11.5–15.5)
WBC: 6.8 10*3/uL (ref 4.0–10.5)

## 2014-09-01 LAB — MAGNESIUM: Magnesium: 1.6 mg/dL — ABNORMAL LOW (ref 1.7–2.4)

## 2014-09-01 LAB — GLUCOSE, CAPILLARY
GLUCOSE-CAPILLARY: 110 mg/dL — AB (ref 65–99)
Glucose-Capillary: 113 mg/dL — ABNORMAL HIGH (ref 65–99)

## 2014-09-01 LAB — CK TOTAL AND CKMB (NOT AT ARMC)
CK, MB: 4.1 ng/mL (ref 0.5–5.0)
Relative Index: INVALID (ref 0.0–2.5)
Total CK: 84 U/L (ref 38–234)

## 2014-09-01 LAB — MRSA PCR SCREENING: MRSA by PCR: NEGATIVE

## 2014-09-01 LAB — TROPONIN I: Troponin I: <0.03 ng/mL (ref <0.031)

## 2014-09-01 LAB — TYPE AND SCREEN
ABO/RH(D): O POS
Antibody Screen: POSITIVE
DAT, IgG: NEGATIVE

## 2014-09-01 SURGERY — RADIO FREQUENCY ABLATION
Anesthesia: General

## 2014-09-01 MED ORDER — ASPIRIN EC 325 MG PO TBEC
325.0000 mg | DELAYED_RELEASE_TABLET | Freq: Every day | ORAL | Status: DC
  Administered 2014-09-01 – 2014-09-02 (×2): 325 mg via ORAL
  Filled 2014-09-01 (×3): qty 1

## 2014-09-01 MED ORDER — GLYCOPYRROLATE 0.2 MG/ML IJ SOLN
INTRAMUSCULAR | Status: DC | PRN
  Administered 2014-09-01: 0.6 mg via INTRAVENOUS

## 2014-09-01 MED ORDER — ONDANSETRON HCL 4 MG/2ML IJ SOLN
INTRAMUSCULAR | Status: DC | PRN
  Administered 2014-09-01: 4 mg via INTRAVENOUS

## 2014-09-01 MED ORDER — CARVEDILOL 12.5 MG PO TABS
12.5000 mg | ORAL_TABLET | Freq: Two times a day (BID) | ORAL | Status: DC
  Filled 2014-09-01 (×2): qty 1

## 2014-09-01 MED ORDER — ALPRAZOLAM 0.5 MG PO TABS: 0.5000 mg | ORAL_TABLET | Freq: Four times a day (QID) | ORAL | Status: DC | PRN

## 2014-09-01 MED ORDER — CISATRACURIUM BESYLATE (PF) 10 MG/5ML IV SOLN
INTRAVENOUS | Status: DC | PRN
  Administered 2014-09-01 (×2): 2 mg via INTRAVENOUS
  Administered 2014-09-01: 1 mg via INTRAVENOUS
  Administered 2014-09-01: 6 mg via INTRAVENOUS

## 2014-09-01 MED ORDER — DEXAMETHASONE SODIUM PHOSPHATE 10 MG/ML IJ SOLN
10.0000 mg | Freq: Once | INTRAMUSCULAR | Status: AC
  Administered 2014-09-01: 10 mg via INTRAVENOUS
  Filled 2014-09-01: qty 1

## 2014-09-01 MED ORDER — NEOSTIGMINE METHYLSULFATE 10 MG/10ML IV SOLN
INTRAVENOUS | Status: DC | PRN
  Administered 2014-09-01: 5 mg via INTRAVENOUS

## 2014-09-01 MED ORDER — PREDNISONE 20 MG PO TABS
20.0000 mg | ORAL_TABLET | Freq: Once | ORAL | Status: AC
Start: 2014-09-02 — End: 2014-09-02
  Administered 2014-09-02: 20 mg via ORAL
  Filled 2014-09-01: qty 1

## 2014-09-01 MED ORDER — EPHEDRINE SULFATE 50 MG/ML IJ SOLN
INTRAMUSCULAR | Status: DC | PRN
  Administered 2014-09-01 (×2): 10 mg via INTRAVENOUS
  Administered 2014-09-01: 5 mg via INTRAVENOUS

## 2014-09-01 MED ORDER — FENTANYL CITRATE (PF) 100 MCG/2ML IJ SOLN
INTRAMUSCULAR | Status: DC | PRN
  Administered 2014-09-01 (×5): 25 ug via INTRAVENOUS

## 2014-09-01 MED ORDER — SUCCINYLCHOLINE CHLORIDE 20 MG/ML IJ SOLN
INTRAMUSCULAR | Status: DC | PRN
  Administered 2014-09-01: 100 mg via INTRAVENOUS

## 2014-09-01 MED ORDER — LOSARTAN POTASSIUM-HCTZ 100-12.5 MG PO TABS: 1.0000 | ORAL_TABLET | Freq: Every morning | ORAL | Status: DC

## 2014-09-01 MED ORDER — FENTANYL CITRATE (PF) 250 MCG/5ML IJ SOLN
INTRAMUSCULAR | Status: AC
  Filled 2014-09-01: qty 5

## 2014-09-01 MED ORDER — HYDROCHLOROTHIAZIDE 12.5 MG PO CAPS
12.5000 mg | ORAL_CAPSULE | Freq: Every day | ORAL | Status: DC
  Administered 2014-09-02: 12.5 mg via ORAL
  Filled 2014-09-01: qty 1

## 2014-09-01 MED ORDER — ONDANSETRON HCL 4 MG/2ML IJ SOLN
4.0000 mg | Freq: Four times a day (QID) | INTRAMUSCULAR | Status: DC | PRN
  Administered 2014-09-01: 4 mg via INTRAVENOUS
  Filled 2014-09-01: qty 2

## 2014-09-01 MED ORDER — DIPHENHYDRAMINE HCL 12.5 MG/5ML PO ELIX: 12.5000 mg | ORAL_SOLUTION | Freq: Four times a day (QID) | ORAL | Status: DC | PRN

## 2014-09-01 MED ORDER — LOSARTAN POTASSIUM 50 MG PO TABS
100.0000 mg | ORAL_TABLET | Freq: Every day | ORAL | Status: DC
  Filled 2014-09-01: qty 2

## 2014-09-01 MED ORDER — PIPERACILLIN-TAZOBACTAM IN DEX 2-0.25 GM/50ML IV SOLN
2.2500 g | Freq: Once | INTRAVENOUS | Status: AC
  Administered 2014-09-01: 2.25 g via INTRAVENOUS
  Filled 2014-09-01 (×2): qty 50

## 2014-09-01 MED ORDER — FENTANYL 10 MCG/ML IV SOLN
INTRAVENOUS | Status: DC
  Filled 2014-09-01: qty 50

## 2014-09-01 MED ORDER — CITRIC ACID-SODIUM CITRATE 334-500 MG/5ML PO SOLN
15.0000 mL | Freq: Three times a day (TID) | ORAL | Status: DC
  Administered 2014-09-01: 15 mL via ORAL

## 2014-09-01 MED ORDER — FENTANYL CITRATE (PF) 100 MCG/2ML IJ SOLN
25.0000 ug | INTRAMUSCULAR | Status: DC | PRN
  Administered 2014-09-01 (×3): 25 ug via INTRAVENOUS

## 2014-09-01 MED ORDER — DIGOXIN 125 MCG PO TABS: 0.1250 mg | ORAL_TABLET | Freq: Every day | ORAL | Status: DC

## 2014-09-01 MED ORDER — PIPERACILLIN-TAZOBACTAM 3.375 G IVPB
3.3750 g | Freq: Three times a day (TID) | INTRAVENOUS | Status: DC
  Filled 2014-09-01 (×6): qty 50

## 2014-09-01 MED ORDER — PROMETHAZINE HCL 25 MG/ML IJ SOLN: 6.2500 mg | INTRAMUSCULAR | Status: DC | PRN

## 2014-09-01 MED ORDER — DOCUSATE SODIUM 100 MG PO CAPS
100.0000 mg | ORAL_CAPSULE | Freq: Two times a day (BID) | ORAL | Status: DC
  Filled 2014-09-01: qty 1

## 2014-09-01 MED ORDER — DEXAMETHASONE SODIUM PHOSPHATE 10 MG/ML IJ SOLN: 10.0000 mg | Freq: Once | INTRAMUSCULAR | Status: DC

## 2014-09-01 MED ORDER — PIPERACILLIN-TAZOBACTAM IN DEX 2-0.25 GM/50ML IV SOLN
2.2500 g | Freq: Four times a day (QID) | INTRAVENOUS | Status: DC
  Administered 2014-09-01 – 2014-09-02 (×3): 2.25 g via INTRAVENOUS
  Filled 2014-09-01 (×5): qty 50

## 2014-09-01 MED ORDER — HYDROCODONE-ACETAMINOPHEN 5-325 MG PO TABS: 1.0000 | ORAL_TABLET | ORAL | Status: DC | PRN

## 2014-09-01 MED ORDER — SODIUM CHLORIDE 0.9 % IJ SOLN: 9.0000 mL | INTRAMUSCULAR | Status: DC | PRN

## 2014-09-01 MED ORDER — CITRIC ACID-SODIUM CITRATE 334-500 MG/5ML PO SOLN: 30.0000 mL | Freq: Three times a day (TID) | ORAL | Status: DC

## 2014-09-01 MED ORDER — CITRIC ACID-SODIUM CITRATE 334-500 MG/5ML PO SOLN
ORAL | Status: AC
  Filled 2014-09-01: qty 15

## 2014-09-01 MED ORDER — FENTANYL CITRATE (PF) 100 MCG/2ML IJ SOLN
INTRAMUSCULAR | Status: AC
  Filled 2014-09-01: qty 2

## 2014-09-01 MED ORDER — PANTOPRAZOLE SODIUM 40 MG PO TBEC
40.0000 mg | DELAYED_RELEASE_TABLET | Freq: Two times a day (BID) | ORAL | Status: DC
  Administered 2014-09-01 – 2014-09-02 (×2): 40 mg via ORAL
  Filled 2014-09-01 (×2): qty 1

## 2014-09-01 MED ORDER — HYDRALAZINE HCL 20 MG/ML IJ SOLN
10.0000 mg | INTRAMUSCULAR | Status: DC | PRN
  Administered 2014-09-01: 10 mg via INTRAVENOUS
  Filled 2014-09-01: qty 1

## 2014-09-01 MED ORDER — LACTATED RINGERS IV SOLN
INTRAVENOUS | Status: DC
  Administered 2014-09-01: 11:00:00 via INTRAVENOUS

## 2014-09-01 MED ORDER — ONDANSETRON HCL 4 MG/2ML IJ SOLN: 4.0000 mg | Freq: Four times a day (QID) | INTRAMUSCULAR | Status: DC | PRN

## 2014-09-01 MED ORDER — NITROGLYCERIN 0.4 MG SL SUBL: 0.4000 mg | SUBLINGUAL_TABLET | SUBLINGUAL | Status: DC | PRN

## 2014-09-01 MED ORDER — LEVOTHYROXINE SODIUM 75 MCG PO TABS
75.0000 ug | ORAL_TABLET | Freq: Every day | ORAL | Status: DC
  Administered 2014-09-02: 75 ug via ORAL
  Filled 2014-09-01 (×2): qty 1

## 2014-09-01 MED ORDER — CETYLPYRIDINIUM CHLORIDE 0.05 % MT LIQD
7.0000 mL | Freq: Two times a day (BID) | OROMUCOSAL | Status: DC
  Administered 2014-09-01 – 2014-09-02 (×2): 7 mL via OROMUCOSAL

## 2014-09-01 MED ORDER — HYOSCYAMINE SULFATE 0.125 MG SL SUBL
0.1250 mg | SUBLINGUAL_TABLET | SUBLINGUAL | Status: DC | PRN
  Filled 2014-09-01: qty 1

## 2014-09-01 MED ORDER — AMITRIPTYLINE HCL 10 MG PO TABS
20.0000 mg | ORAL_TABLET | Freq: Every day | ORAL | Status: DC
  Administered 2014-09-01: 20 mg via ORAL
  Filled 2014-09-01 (×3): qty 2

## 2014-09-01 MED ORDER — PROPOFOL 10 MG/ML IV BOLUS
INTRAVENOUS | Status: DC | PRN
  Administered 2014-09-01: 11 mg via INTRAVENOUS

## 2014-09-01 MED ORDER — NALOXONE HCL 0.4 MG/ML IJ SOLN: 0.4000 mg | INTRAMUSCULAR | Status: DC | PRN

## 2014-09-01 MED ORDER — DIPHENHYDRAMINE HCL 50 MG/ML IJ SOLN: 12.5000 mg | Freq: Four times a day (QID) | INTRAMUSCULAR | Status: DC | PRN

## 2014-09-01 NOTE — Anesthesia Procedure Notes (Signed)
Procedure Name: Intubation Date/Time: 09/01/2014 12:03 PM Performed by: Maxwell Caul Pre-anesthesia Checklist: Patient identified, Emergency Drugs available, Suction available and Patient being monitored Patient Re-evaluated:Patient Re-evaluated prior to inductionOxygen Delivery Method: Circle System Utilized Preoxygenation: Pre-oxygenation with 100% oxygen Intubation Type: IV induction Ventilation: Mask ventilation without difficulty Laryngoscope Size: Mac and 4 Grade View: Grade I Tube type: Oral Tube size: 7.0 mm Number of attempts: 1 Airway Equipment and Method: Stylet and Oral airway Placement Confirmation: ETT inserted through vocal cords under direct vision,  positive ETCO2 and breath sounds checked- equal and bilateral Secured at: 21 cm Tube secured with: Tape Dental Injury: Teeth and Oropharynx as per pre-operative assessment

## 2014-09-01 NOTE — Progress Notes (Signed)
Subjective: S/p successful FNA of left hepatic lobe lesion and core biopsy followed by MWA ablation. The patient c/o epigastric left hepatic lobe procedure site pain that is constant and radiates up to left shoulder and this does not get worse with inspiration. She denies any shortness of breath or diaphoresis. She denies any lightheadedness or dizziness. She denies any current nausea.    Allergies: Bactrim; Codeine; Demerol; Lidocaine hcl; Morphine and related; Nitrofurantoin monohyd macro; Lisinopril; and Ramipril  Medications: Prior to Admission medications   Medication Sig Start Date End Date Taking? Authorizing Provider  ALPRAZolam Duanne Moron) 0.5 MG tablet Take 0.5 mg by mouth every 6 (six) hours as needed for anxiety.    Yes Historical Provider, MD  amitriptyline (ELAVIL) 10 MG tablet Take 20 mg by mouth at bedtime.    Yes Historical Provider, MD  aspirin EC 325 MG tablet Take 325 mg by mouth daily.   Yes Historical Provider, MD  bifidobacterium infantis (ALIGN) capsule Take 1 capsule by mouth daily. 08/22/13  Yes Rogene Houston, MD  carvedilol (COREG) 12.5 MG tablet Take 1 tablet (12.5 mg total) by mouth 2 (two) times daily with a meal. 12/02/13  Yes Darlin Coco, MD  digoxin (LANOXIN) 0.125 MG tablet TAKE ONE TABLET BY MOUTH ONCE DAILY 12/02/13  Yes Darlin Coco, MD  HYDROcodone-acetaminophen (NORCO/VICODIN) 5-325 MG per tablet Take 1 tablet by mouth every 4 (four) hours as needed for moderate pain.   Yes Historical Provider, MD  hyoscyamine (LEVSIN SL) 0.125 MG SL tablet Place 0.125 mg under the tongue every 4 (four) hours as needed (lower abdominal pain).   Yes Historical Provider, MD  ketoconazole (NIZORAL) 2 % cream Apply 1 application topically 2 (two) times daily. Apply to feet twice daily 06/01/14  Yes Historical Provider, MD  levothyroxine (SYNTHROID, LEVOTHROID) 75 MCG tablet Take 75 mcg by mouth daily.    Yes Historical Provider, MD  losartan-hydrochlorothiazide (HYZAAR)  100-12.5 MG per tablet Take 1 tablet by mouth every morning.    Yes Historical Provider, MD  Multiple Vitamins-Minerals (EQL GUMMY ADULT) CHEW Chew 2 tablets by mouth daily.    Yes Historical Provider, MD  Omega-3 Fatty Acids (FISH OIL) 1000 MG CAPS Take 1 capsule by mouth daily.   Yes Historical Provider, MD  pantoprazole (PROTONIX) 40 MG tablet Take 40 mg by mouth 2 (two) times daily.   Yes Historical Provider, MD  triamcinolone lotion (KENALOG) 0.1 % Apply 1 application topically every 8 (eight) hours. 06/28/14  Yes Historical Provider, MD  NITROSTAT 0.4 MG SL tablet DISSOLVE ONE TABLET UNDER THE TONGUE EVERY 5 MINUTES AS NEEDED FOR CHEST PAIN.  DO NOT EXCEED A TOTAL OF 3 DOSES IN 15 MINUTES 11/15/13   Darlin Coco, MD  ondansetron (ZOFRAN-ODT) 4 MG disintegrating tablet Take 1 tablet (4 mg total) by mouth every 8 (eight) hours as needed for nausea or vomiting. 07/11/13   Davonna Belling, MD    Vital Signs: BP 153/67 mmHg  Pulse 35  Temp(Src) 97 F (36.1 C)  Resp 12  Ht 5' (1.524 m)  Wt 142 lb (64.411 kg)  BMI 27.73 kg/m2  SpO2 97%  Physical Exam General: A&Ox3, appears in pain Abd: Soft, ND, epigastric left hepatic lobe procedure site TTP, dressing C/D/I, ostomy intact   Imaging: Ct Guide Tissue Ablation  09/01/2014   CLINICAL DATA:  81 year old female with a history of colon adenocarcinoma and a solitary enlarging lesion in the left liver most consistent with liver metastasis. She presents today for  imaging guided biopsy and percutaneous thermal ablation.  EXAM: CT GUIDED ABLATION; US BIOPSY  Date: 09/01/2014  PROCEDURE: 1. Ultrasound-guided Fine needle aspiration biopsy of left hepatic lesion 2. Ultrasound-guided Core biopsy of left hepatic lesion 3. Percutaneous thermal ablation using both ultrasound and CT guidance Interventional Radiologist:  Criselda Peaches, MD  ANESTHESIA/SEDATION: General anesthesia provided by the anesthesiology service  MEDICATIONS: 2.25 g Zosyn  administered within 1 hour of skin incision  TECHNIQUE: Informed consent was obtained from the patient following explanation of the procedure, risks, benefits and alternatives. The patient understands, agrees and consents for the procedure. All questions were addressed. A time out was performed.  Maximal barrier sterile technique utilized including caps, mask, sterile gowns, sterile gloves, large sterile drape, hand hygiene, and Betadine skin prep.  A planning axial CT scan was performed. The left hepatic lesion is not visible by nonenhanced CT imaging. Given the patient's renal function, intravenous contrast could not be administered safely. Therefore, the mid epigastric region was interrogated with ultrasound. A 2.7 x 2.5 cm hypoechoic lesion is clearly visible sonographically. A suitable skin entry site was selected and marked. Local anesthesia was attained by infiltration with 1% lidocaine. A small dermatotomy was made. Under real-time sonographic guidance, a 17 gauge introducer needle was advanced through the liver and into the margin of the mass. Multiple fine needle aspirations were performed and sent for quick stain evaluation.  Subsequently, several 18 gauge core biopsies were coaxially obtained using the bio Pince automated biopsy device. Cytology confirmed malignant cells and necrosis most consistent with metastatic adenocarcinoma.  At that point, 2 NeuWave 15 cm PR probes were then placed in parallel fashion dividing the lesion into thirds. A combination of ultrasound and CT guidance was used for this portion of the procedure. Once adequate probe position was confirmed with CT, percutaneous thermal ablation was carried out with both probes powered at 74 W for 6 minutes. The ablation zone was monitored actively with ultrasound.  Following successful ablation, the probes were removed while ablating the tracts. Postprocedure acts role CT imaging demonstrates trace blood along the anterior aspect of the  liver. No significant hemorrhage or other complication.  COMPLICATIONS: None  Estimated Blood Loss: 0  IMPRESSION: 1. Successful ultrasound-guided fine-needle aspiration and core biopsy of left hepatic lesion. Quick stain cytology confirms malignancy. 2. Successful percutaneous thermal ablation of metastatic left hepatic lesion.  Signed,  Criselda Peaches, MD  [facility]  [facility] Radiology   Electronically Signed   By: [name] M.D.   On: 09/01/2014 15:47   Ct Biopsy  09/01/2014   CLINICAL DATA:  81 year old female with a history of colon adenocarcinoma and a solitary enlarging lesion in the left liver most consistent with liver metastasis. She presents today for imaging guided biopsy and percutaneous thermal ablation.  EXAM: CT GUIDED ABLATION; US BIOPSY  Date: 09/01/2014  PROCEDURE: 1. Ultrasound-guided Fine needle aspiration biopsy of left hepatic lesion 2. Ultrasound-guided Core biopsy of left hepatic lesion 3. Percutaneous thermal ablation using both ultrasound and CT guidance Interventional Radiologist:  Criselda Peaches, MD  ANESTHESIA/SEDATION: General anesthesia provided by the anesthesiology service  MEDICATIONS: 2.25 g Zosyn administered within 1 hour of skin incision  TECHNIQUE: Informed consent was obtained from the patient following explanation of the procedure, risks, benefits and alternatives. The patient understands, agrees and consents for the procedure. All questions were addressed. A time out was performed.  Maximal barrier sterile technique utilized including caps, mask, sterile gowns, sterile  gloves, large sterile drape, hand hygiene, and Betadine skin prep.  A planning axial CT scan was performed. The left hepatic lesion is not visible by nonenhanced CT imaging. Given the patient's renal function, intravenous contrast could not be administered safely. Therefore, the mid epigastric region was interrogated with ultrasound. A 2.7 x 2.5 cm  hypoechoic lesion is clearly visible sonographically. A suitable skin entry site was selected and marked. Local anesthesia was attained by infiltration with 1% lidocaine. A small dermatotomy was made. Under real-time sonographic guidance, a 17 gauge introducer needle was advanced through the liver and into the margin of the mass. Multiple fine needle aspirations were performed and sent for quick stain evaluation.  Subsequently, several 18 gauge core biopsies were coaxially obtained using the bio Pince automated biopsy device. Cytology confirmed malignant cells and necrosis most consistent with metastatic adenocarcinoma.  At that point, 2 NeuWave 15 cm PR probes were then placed in parallel fashion dividing the lesion into thirds. A combination of ultrasound and CT guidance was used for this portion of the procedure. Once adequate probe position was confirmed with CT, percutaneous thermal ablation was carried out with both probes powered at 59 W for 6 minutes. The ablation zone was monitored actively with ultrasound.  Following successful ablation, the probes were removed while ablating the tracts. Postprocedure acts role CT imaging demonstrates trace blood along the anterior aspect of the liver. No significant hemorrhage or other complication.  COMPLICATIONS: None  Estimated Blood Loss: 0  IMPRESSION: 1. Successful ultrasound-guided fine-needle aspiration and core biopsy of left hepatic lesion. Quick stain cytology confirms malignancy. 2. Successful percutaneous thermal ablation of metastatic left hepatic lesion.  Signed,  Criselda Peaches, MD  [facility]  [facility] Radiology   Electronically Signed   By: [name] M.D.   On: 09/01/2014 15:47    Labs:  CBC:  Recent Labs  05/23/14 1000 06/15/14 0355 06/30/14 1053 08/30/14 0935  WBC 5.3 8.1 6.2 5.6  HGB 12.0 12.8 11.4* 11.0*  HCT 36.5 39.4 34.5* 33.6*  PLT 185 181 184 159    COAGS:  Recent Labs   08/30/14 0935  INR 1.12  APTT 34    BMP:  Recent Labs  05/23/14 1000 06/15/14 0355 06/16/14 0659 08/15/14 1356 08/30/14 0935  NA 140 137 142  --  141  K 4.0 3.8 3.6  --  5.0  CL 103 101 104  --  109  CO2 27 27 29   --  26  GLUCOSE 114* 134* 108*  --  120*  BUN 31* 20 21* 43* 31*  CALCIUM 9.2 9.5 8.9  --  9.6  CREATININE 1.20* 1.24* 1.06* 1.50* 1.66*  GFRNONAA 41* 39* 48* 32* 28*  GFRAA 47* 46* 55* 37* 32*    LIVER FUNCTION TESTS:  Recent Labs  12/30/13 1106 05/23/14 1000 06/15/14 0355 08/30/14 0935  BILITOT 0.4 0.5 0.7 0.5  AST 23 31 31 22   ALT 19 22 27 18   ALKPHOS 79 70 71 65  PROT 7.3 7.7 8.3* 7.3  ALBUMIN 3.6 3.9 4.3 3.8    Assessment and Plan: 1.3 cm left hepatic lobe lesion, elevated CEA with concern for metastatic colon cancer and not a surgical candidate  S/p successful FNA of left hepatic lobe lesion and core Biopsy followed by MWA ablation  Admitted overnight for observation and pain control Bradycardia-asymptomatic, ECG unchanged from pre-op history of LBBB, last cardiac stress test on file 2014 normal findings- will transfer to telemetry floor  Will hold digoxin and coreg, giving IV decadron for pain and low dose fentanyl  Possible discharge in am if stable Will need 2-4 week F/U in IR clinic    Signed: Hedy Jacob 09/01/2014, 4:29 PM

## 2014-09-01 NOTE — Transfer of Care (Signed)
Immediate Anesthesia Transfer of Care Note  Patient: Karen [name]  Procedure(s) Performed: Procedure(s): THERMAL MICROWAVE ABLATION (N/A)  Patient Location: PACU  Anesthesia Type:General  Level of Consciousness:  sedated, patient cooperative and responds to stimulation  Airway & Oxygen Therapy:Patient Spontanous Breathing and Patient connected to face mask oxgen  Post-op Assessment:  Report given to PACU RN and Post -op Vital signs reviewed and stable  Post vital signs:  Reviewed and stable  Last Vitals: There were no vitals filed for this visit.  Complications: No apparent anesthesia complications

## 2014-09-01 NOTE — Anesthesia Preprocedure Evaluation (Addendum)
Anesthesia Evaluation  Patient identified by MRN, date of birth, ID band Patient awake    Reviewed: Allergy & Precautions, NPO status , Patient's Chart, lab work & pertinent test results  Airway Mallampati: II  TM Distance: >3 FB Neck ROM: Full    Dental no notable dental hx. (+) Upper Dentures, Lower Dentures   Pulmonary asthma , former smoker,  breath sounds clear to auscultation  Pulmonary exam normal       Cardiovascular hypertension, Pt. on medications +CHF Normal cardiovascular exam+ dysrhythmias Supra Ventricular Tachycardia Rhythm:Regular Rate:Normal  LBBB   Neuro/Psych negative neurological ROS  negative psych ROS   GI/Hepatic Neg liver ROS, hiatal hernia, GERD-  ,  Endo/Other  diabetes, Type 2  Renal/GU negative Renal ROS  negative genitourinary   Musculoskeletal  (+) Fibromyalgia -  Abdominal   Peds negative pediatric ROS (+)  Hematology negative hematology ROS (+)   Anesthesia Other Findings   Reproductive/Obstetrics negative OB ROS                            Anesthesia Physical Anesthesia Plan  ASA: III  Anesthesia Plan: General   Post-op Pain Management:    Induction: Intravenous  Airway Management Planned: Oral ETT  Additional Equipment:   Intra-op Plan:   Post-operative Plan: Extubation in OR  Informed Consent: I have reviewed the patients History and Physical, chart, labs and discussed the procedure including the risks, benefits and alternatives for the proposed anesthesia with the patient or authorized representative who has indicated his/her understanding and acceptance.   Dental advisory given  Plan Discussed with: CRNA  Anesthesia Plan Comments:         Anesthesia Quick Evaluation

## 2014-09-01 NOTE — Progress Notes (Signed)
eLink Physician-Brief Progress Note Patient Name: Karen [name] DOB: [birth year] MRN: [id number]   Date of Service  09/01/2014  HPI/Events of Note  RN contacted EICU regarding bradycardia. Pain unchanged post-procedure. Appears sinus brady. Discussed case with IR Attending (Dr. Kathlene Cote).  eICU Interventions  Checking electrolytes, EKG, & CXR stat.     Intervention Category Major Interventions: Arrhythmia - evaluation and management  Tera Partridge 09/01/2014, 6:13 PM

## 2014-09-01 NOTE — H&P (Signed)
History of Present Illness: Karen [name] is a 80 y.o. female with probable metastatic colon cancer to the liver who has been seen in IR clinic on 08/10/14 and deemed a candidate for image guided hepatic mass biopsy followed by thermal ablation. The patient denies any chest pain, shortness of breath or palpitations. She denies any active signs of bleeding or excessive bruising. She denies any recent infections, fever, chills or urinary symptoms. The patient denies any history of sleep apnea or chronic oxygen use. She does admit to some abdominal tenderness only to deep palpation and nausea this morning that she thinks is due to NPO status.  Past Medical History  Diagnosis Date  . Chronic diastolic CHF (congestive heart failure)     a. nl EF by echo 05/2009.  Marland Kitchen DVT of deep femoral vein   . Palpitations   . History of PSVT (paroxysmal supraventricular tachycardia)   . Hypertension   . Abdominal adhesions   . Ileostomy present   . Colon polyp   . Chest pain     a. 2002 Cath: nl cors;  b. 2009 aden mv: nl;  c. 05/2009 Echo: nl.  . Wears dentures   . Breast lump   . Hyperlipidemia   . Thyroid disease   . Anemia   . History of blood clots   . Hernia   . Blood transfusion   . Hearing loss   . Arthritis   . GERD (gastroesophageal reflux disease)   . Cancer     bladder  . Colon cancer     x 2. Last 2010  . Iron deficiency anemia due to chronic blood loss 07/06/2013  . UTI (urinary tract infection)   . Obstruction of intestine or colon 06/2014  . Shortness of breath dyspnea     at times  . Diabetes mellitus without complication 6/38/46    borderline type II; takes no medication for it  . Vision abnormalities 2016    not going blind but having retina problems; might lose ability to see faces  . LBBB (left bundle branch block)   . Anginal pain     occasionally  . Asthma     hx of  . Depression   . Chronic kidney disease     pt. states decreased kidney function  . Headache   .  Fibromyalgia   . History of hiatal hernia     botox injection for achalasia    Past Surgical History  Procedure Laterality Date  . Cardiac catheterization  12/10/2000    THE LEFT VENTRICLE IS MILDY DILATED. THERE IS MILD TO MODERATE DIFFUSE HYPOKINESIS WITH EF 35%  . Colon cancer surgery    . Ileostomy    . Esophagogastroduodenoscopy  02/13/2011    Procedure: ESOPHAGOGASTRODUODENOSCOPY (EGD);  Surgeon: Rogene Houston, MD;  Location: [facility];  Service: Endoscopy;  Laterality: N/A;  1030  . Colonoscopy  09/26/2011    Procedure: COLONOSCOPY;  Surgeon: Rogene Houston, MD;  Location: [facility];  Service: Endoscopy;  Laterality: N/A;  215  . Cataract extraction w/phaco  11/13/2011    Procedure: CATARACT EXTRACTION PHACO AND INTRAOCULAR LENS PLACEMENT (IOC);  Surgeon: Tonny Branch, MD;  Location: [facility];  Service: Ophthalmology;  Laterality: Right;  CDE 12.26  . Cataract extraction w/phaco  12/08/2011    Procedure: CATARACT EXTRACTION PHACO AND INTRAOCULAR LENS PLACEMENT (IOC);  Surgeon: Tonny Branch, MD;  Location: [facility];  Service: Ophthalmology;  Laterality: Left;  CDE 15.11  .  Esophagogastroduodenoscopy N/A 09/01/2013    Procedure: ESOPHAGOGASTRODUODENOSCOPY (EGD);  Surgeon: Rogene Houston, MD;  Location: [facility];  Service: Endoscopy;  Laterality: N/A;  830  . Botox injection N/A 09/01/2013    Procedure: BOTOX INJECTION;  Surgeon: Rogene Houston, MD;  Location: [facility];  Service: Endoscopy;  Laterality: N/A;  . Abdominal hysterectomy    . Cholecystectomy    . Joint replacement Left 2008  . Tonsillectomy    . Colon surgery    . Eye surgery  2014    catarac surgery  . Appendectomy    . Ovarian cystectomy 1955    . Ileostomy    . Coronary angioplasty    . Rotator cuff repair    . Breast surgery      right breast biopsy    Allergies: Bactrim; Codeine; Demerol; Lidocaine hcl; Morphine and related; Nitrofurantoin monohyd macro; Lisinopril; and  Ramipril  Medications: Prior to Admission medications   Medication Sig Start Date End Date Taking? Authorizing Provider  ALPRAZolam Duanne Moron) 0.5 MG tablet Take 0.5 mg by mouth every 6 (six) hours as needed for anxiety.     Historical Provider, MD  amitriptyline (ELAVIL) 10 MG tablet Take 20 mg by mouth at bedtime.     Historical Provider, MD  aspirin EC 325 MG tablet Take 325 mg by mouth daily.    Historical Provider, MD  bifidobacterium infantis (ALIGN) capsule Take 1 capsule by mouth daily. 08/22/13   Rogene Houston, MD  carvedilol (COREG) 12.5 MG tablet Take 1 tablet (12.5 mg total) by mouth 2 (two) times daily with a meal. 12/02/13   Darlin Coco, MD  digoxin (LANOXIN) 0.125 MG tablet TAKE ONE TABLET BY MOUTH ONCE DAILY 12/02/13   Darlin Coco, MD  HYDROcodone-acetaminophen (NORCO/VICODIN) 5-325 MG per tablet Take 1 tablet by mouth every 4 (four) hours as needed for moderate pain.    Historical Provider, MD  hyoscyamine (LEVSIN SL) 0.125 MG SL tablet Place 0.125 mg under the tongue every 4 (four) hours as needed (lower abdominal pain).    Historical Provider, MD  ketoconazole (NIZORAL) 2 % cream Apply 1 application topically 2 (two) times daily. Apply to feet twice daily 06/01/14   Historical Provider, MD  levothyroxine (SYNTHROID, LEVOTHROID) 75 MCG tablet Take 75 mcg by mouth daily.     Historical Provider, MD  losartan-hydrochlorothiazide (HYZAAR) 100-12.5 MG per tablet Take 1 tablet by mouth every morning.     Historical Provider, MD  Multiple Vitamins-Minerals (EQL GUMMY ADULT) CHEW Chew 2 tablets by mouth daily.     Historical Provider, MD  NITROSTAT 0.4 MG SL tablet DISSOLVE ONE TABLET UNDER THE TONGUE EVERY 5 MINUTES AS NEEDED FOR CHEST PAIN.  DO NOT EXCEED A TOTAL OF 3 DOSES IN 15 MINUTES 11/15/13   Darlin Coco, MD  Omega-3 Fatty Acids (FISH OIL) 1000 MG CAPS Take 1 capsule by mouth daily.    Historical Provider, MD  ondansetron (ZOFRAN-ODT) 4 MG disintegrating tablet Take 1  tablet (4 mg total) by mouth every 8 (eight) hours as needed for nausea or vomiting. 07/11/13   Davonna Belling, MD  pantoprazole (PROTONIX) 40 MG tablet Take 40 mg by mouth 2 (two) times daily.    Historical Provider, MD  triamcinolone lotion (KENALOG) 0.1 % Apply 1 application topically every 8 (eight) hours. 06/28/14   Historical Provider, MD     Family History  Problem Relation Age of Onset  . Stroke    . Hypertension    .  Heart disease Mother   . Stroke Mother     deceased  . Cancer Mother     bladder  . Arthritis Mother   . Arthritis Father   . Heart disease Father     decesaed  . Cancer Father     leukemia  . Stroke Sister     alive/debilitated  . Hypertension Sister   . Other Sister     paralysis  . Heart disease Brother     bypass surgery  . Cancer Brother   . Arthritis Brother   . Stroke Sister     alive/debilitated  . Diabetes Sister   . Other Brother     stomach problems  . Other Brother     bladder    History   Social History  . Marital Status: Married    Spouse Name: N/A  . Number of Children: N/A  . Years of Education: N/A   Social History Main Topics  . Smoking status: Former Smoker    Quit date: 02/11/1963  . Smokeless tobacco: Never Used  . Alcohol Use: No  . Drug Use: No  . Sexual Activity: Not on file   Other Topics Concern  . None   Social History Narrative    Review of Systems: A 12 point ROS discussed and pertinent positives are indicated in the HPI above.  All other systems are negative.  Review of Systems  Vital Signs: BP 133/54 mmHg  Pulse 67  Temp(Src) 98.3 F (36.8 C) (Oral)  Resp 16  SpO2 98%  Physical Exam  Constitutional: She is oriented to person, place, and time. No distress.  HENT:  Head: Normocephalic and atraumatic.  Neck: No tracheal deviation present.  Cardiovascular: Normal rate and regular rhythm.  Exam reveals no gallop and no friction rub.   No murmur heard. Pulmonary/Chest: Effort normal and breath  sounds normal. No respiratory distress. She has no wheezes. She has no rales.  Abdominal: Soft. Bowel sounds are normal. She exhibits no distension.  Ostomy intact. Minimal epigastric TTP  Neurological: She is alert and oriented to person, place, and time.  Skin: She is not diaphoretic.    Mallampati Score:  MD Evaluation Airway: WNL Heart: WNL Abdomen: WNL Chest/ Lungs: WNL ASA  Classification: 3 Mallampati/Airway Score: Two  Imaging: Mr Abdomen Wo Contrast  08/23/2014   CLINICAL DATA:  81 year old female with history of colorectal cancer status post resection. Elevated CEA levels. New liver lesion in the left lobe of the liver suspicious for metastasis. Additional lower pole lesion in the left kidney suspicious for potential renal cell carcinoma.  EXAM: MRI ABDOMEN WITHOUT CONTRAST  TECHNIQUE: Multiplanar multisequence MR imaging was performed without the administration of intravenous contrast.  COMPARISON:  CT of the chest, abdomen and pelvis 07/13/2014.  FINDINGS: Comment: Assessment for visceral and/or vascular lesions is exceedingly limited on today's examination secondary to lack of IV gadolinium.  Lower chest:  Unremarkable.  Hepatobiliary: In segment 2 of the liver there is a well-defined enlarging lesion measuring 2.5 x 1.9 cm which is slightly T1 hypointense, slightly T2 hyperintense and demonstrates restricted diffusion, highly concerning for hepatic metastasis. No other definite hepatic lesions are noted at this time. Diffuse loss of signal intensity throughout the hepatic parenchyma on out of phase dual echo images, compatible with hepatic steatosis. No intra or extrahepatic biliary ductal dilatation. Status post cholecystectomy.  Pancreas: No definite pancreatic mass identified on today's noncontrast examination. No pancreatic ductal dilatation. No peripancreatic fluid collections or inflammatory changes.  Spleen: Unremarkable.  Adrenals/Urinary Tract: The lesion of concern in the  lower pole of the left kidney measures approximately 1.3 cm in diameter and is slightly T1 hyperintense, T2 hypointense, and demonstrates no diffusion restriction, suggestive of a proteinaceous or hemorrhagic cyst. Several other small renal lesions are noted bilaterally which are T1 hypointense, T2 hyperintense and favored to represent simple cysts.  Stomach/Bowel: Postoperative changes of right hemicolectomy and right lower quadrant ileostomy incompletely visualized.  Vascular/Lymphatic: No definite aneurysm identified in the visualized abdominal vasculature. No lymphadenopathy noted in the visualized abdomen.  Other: No significant volume of ascites noted in the visualized peritoneal cavity.  Musculoskeletal: No aggressive osseous lesions noted in the visualized portions of the skeleton.  IMPRESSION: 1. Although the study is limited by lack of IV gadolinium, the appearance of the 2.5 x 1.9 cm lesion in segment 2 of the liver is highly suspicious for a a solitary hepatic metastasis. No other lesions are noted at this time. 2. The lesion of concern in the lower pole of the left kidney has imaging characteristics most suggestive of a proteinaceous or hemorrhagic cyst. Attention on followup studies is recommended to ensure continued stability. 3. Hepatic steatosis. 4. Additional incidental findings, as above.   Electronically Signed   By: Vinnie Langton M.D.   On: 08/23/2014 11:30    Labs:  CBC:  Recent Labs  05/23/14 1000 06/15/14 0355 06/30/14 1053 08/30/14 0935  WBC 5.3 8.1 6.2 5.6  HGB 12.0 12.8 11.4* 11.0*  HCT 36.5 39.4 34.5* 33.6*  PLT 185 181 184 159    COAGS:  Recent Labs  08/30/14 0935  INR 1.12  APTT 34    BMP:  Recent Labs  05/23/14 1000 06/15/14 0355 06/16/14 0659 08/15/14 1356 08/30/14 0935  NA 140 137 142  --  141  K 4.0 3.8 3.6  --  5.0  CL 103 101 104  --  109  CO2 27 27 29   --  26  GLUCOSE 114* 134* 108*  --  120*  BUN 31* 20 21* 43* 31*  CALCIUM 9.2 9.5  8.9  --  9.6  CREATININE 1.20* 1.24* 1.06* 1.50* 1.66*  GFRNONAA 41* 39* 48* 32* 28*  GFRAA 47* 46* 55* 37* 32*    LIVER FUNCTION TESTS:  Recent Labs  12/30/13 1106 05/23/14 1000 06/15/14 0355 08/30/14 0935  BILITOT 0.4 0.5 0.7 0.5  AST 23 31 31 22   ALT 19 22 27 18   ALKPHOS 79 70 71 65  PROT 7.3 7.7 8.3* 7.3  ALBUMIN 3.6 3.9 4.3 3.8    TUMOR MARKERS:  Recent Labs  12/30/13 1106 06/30/14 1120  CEA 6.1* 35.5*    Assessment and Plan: 1.3 cm left hepatic lobe lesion, elevated CEA with concern for metastatic colon cancer and not a surgical candidate  Slow growing left renal mass-appt to be made with Urologist  Seen in full consult on 08/10/14 in IR clinic Scheduled today for image guided left hepatic lobe lesion FNA and core biopsy followed by thermal ablation with general anesthesia  Renal insufficiency, will dose Zosyn appropriately The patient has been NPO, no blood thinners taken, labs and vitals have been reviewed. Risks and Benefits discussed with the patient including, but not limited to bleeding, infection, liver failure, bile duct injury, pneumothorax or damage to adjacent structures. All of the patient's questions were answered, patient is agreeable to proceed. Consent signed and in chart. Cardiac clearance obtained from Dr. Mare Ferrari    Signed: Tsosie Billing D 09/01/2014,  11:31 AM

## 2014-09-01 NOTE — Progress Notes (Signed)
Pt HR in 30s. MD aware. Pt transferred to 1228. Bedside report given

## 2014-09-01 NOTE — Anesthesia Postprocedure Evaluation (Signed)
  Anesthesia Post-op Note  Patient: Karen [name]  Procedure(s) Performed: Procedure(s) (LRB): THERMAL MICROWAVE ABLATION (N/A)  Patient Location: PACU  Anesthesia Type: General  Level of Consciousness: awake and alert   Airway and Oxygen Therapy: Patient Spontanous Breathing  Post-op Pain: mild  Post-op Assessment: Post-op Vital signs reviewed, Patient's Cardiovascular Status Stable, Respiratory Function Stable, Patent Airway and No signs of Nausea or vomiting  Last Vitals:  Filed Vitals:   09/01/14 1550  BP: 153/67  Pulse: 35  Temp:   Resp:     Post-op Vital Signs: stable   Complications: No apparent anesthesia complications

## 2014-09-01 NOTE — Procedures (Signed)
Interventional Radiology Procedure Note  Procedure: 1.) FNA left liver lesion 2.) Core bx left liver lesion 3.) MWA left liver lesion  Complications: None  Estimated Blood Loss: 0  Recommendations: - Admit for obs - Continue zosyn 2.25g Q6 hrs - Pain control  Signed,  Criselda Peaches, MD

## 2014-09-01 NOTE — Progress Notes (Signed)
Pt arrived to 1325 from PACU accompanied by RN.Bedside report given. Pt was alert and oriented times 4 on RA with complaints of pain. HR is 30s asymptomatic, PA arrived to bedside and MD contacted. Pt arrived with foley catheter intact and PIV intact. Orders reviewed. Family arrived to bedside

## 2014-09-02 ENCOUNTER — Encounter (HOSPITAL_COMMUNITY): Payer: Self-pay | Admitting: *Deleted

## 2014-09-02 ENCOUNTER — Other Ambulatory Visit: Payer: Self-pay | Admitting: Radiology

## 2014-09-02 DIAGNOSIS — C189 Malignant neoplasm of colon, unspecified: Secondary | ICD-10-CM | POA: Diagnosis not present

## 2014-09-02 DIAGNOSIS — C787 Secondary malignant neoplasm of liver and intrahepatic bile duct: Secondary | ICD-10-CM | POA: Diagnosis not present

## 2014-09-02 DIAGNOSIS — I5032 Chronic diastolic (congestive) heart failure: Secondary | ICD-10-CM | POA: Diagnosis not present

## 2014-09-02 DIAGNOSIS — I129 Hypertensive chronic kidney disease with stage 1 through stage 4 chronic kidney disease, or unspecified chronic kidney disease: Secondary | ICD-10-CM | POA: Diagnosis not present

## 2014-09-02 DIAGNOSIS — K769 Liver disease, unspecified: Secondary | ICD-10-CM | POA: Insufficient documentation

## 2014-09-02 DIAGNOSIS — N189 Chronic kidney disease, unspecified: Secondary | ICD-10-CM | POA: Diagnosis not present

## 2014-09-02 DIAGNOSIS — I471 Supraventricular tachycardia: Secondary | ICD-10-CM | POA: Diagnosis not present

## 2014-09-02 LAB — CBC
HCT: 33.4 % — ABNORMAL LOW (ref 36.0–46.0)
Hemoglobin: 10.8 g/dL — ABNORMAL LOW (ref 12.0–15.0)
MCH: 28.3 pg (ref 26.0–34.0)
MCHC: 32.3 g/dL (ref 30.0–36.0)
MCV: 87.4 fL (ref 78.0–100.0)
PLATELETS: 149 10*3/uL — AB (ref 150–400)
RBC: 3.82 MIL/uL — ABNORMAL LOW (ref 3.87–5.11)
RDW: 12.8 % (ref 11.5–15.5)
WBC: 5.2 10*3/uL (ref 4.0–10.5)

## 2014-09-02 LAB — CK TOTAL AND CKMB (NOT AT ARMC)
CK TOTAL: 85 U/L (ref 38–234)
CK, MB: 3.4 ng/mL (ref 0.5–5.0)
CK, MB: 3.9 ng/mL (ref 0.5–5.0)
Relative Index: INVALID (ref 0.0–2.5)
Relative Index: INVALID (ref 0.0–2.5)
Total CK: 69 U/L (ref 38–234)

## 2014-09-02 LAB — COMPREHENSIVE METABOLIC PANEL
ALBUMIN: 3.5 g/dL (ref 3.5–5.0)
ALK PHOS: 64 U/L (ref 38–126)
ALT: 95 U/L — AB (ref 14–54)
AST: 147 U/L — AB (ref 15–41)
Anion gap: 8 (ref 5–15)
BUN: 31 mg/dL — ABNORMAL HIGH (ref 6–20)
CALCIUM: 9 mg/dL (ref 8.9–10.3)
CO2: 25 mmol/L (ref 22–32)
Chloride: 106 mmol/L (ref 101–111)
Creatinine, Ser: 1.31 mg/dL — ABNORMAL HIGH (ref 0.44–1.00)
GFR calc non Af Amer: 37 mL/min — ABNORMAL LOW (ref >60)
GFR, EST AFRICAN AMERICAN: 43 mL/min — AB (ref >60)
Glucose, Bld: 160 mg/dL — ABNORMAL HIGH (ref 65–99)
Potassium: 4 mmol/L (ref 3.5–5.1)
SODIUM: 139 mmol/L (ref 135–145)
Total Bilirubin: 0.9 mg/dL (ref 0.3–1.2)
Total Protein: 6.8 g/dL (ref 6.5–8.1)

## 2014-09-02 LAB — GLUCOSE, CAPILLARY: Glucose-Capillary: 148 mg/dL — ABNORMAL HIGH (ref 65–99)

## 2014-09-02 MED ORDER — CIPROFLOXACIN HCL 500 MG PO TABS: 500.0000 mg | ORAL_TABLET | Freq: Two times a day (BID) | ORAL | Status: AC

## 2014-09-02 NOTE — Progress Notes (Signed)
Reviewed discharge summary with husband and patient.  Provided copy for the patient.  Patient has one prescription for antibiotics to fill when discharged.  Waiting for daughters to arrive to be discharged.  Soundra Lampley Cheryln Manly, BSN

## 2014-09-02 NOTE — Progress Notes (Signed)
Discharged to home via wheelchair, with husband and daughter.  Gemini Beaumier Cheryln Manly, BSN

## 2014-09-02 NOTE — Discharge Summary (Signed)
Patient ID: Karen [name] MRN: [id number] DOB/AGE: 04/13/1929 81 y.o.  Admit date: 09/01/2014 Discharge date: 09/02/2014  Admission Diagnoses: Probable colon cancer with metastatic disease to liver.   Discharge Diagnoses:  Principal Problem:   Colon cancer metastasized to liver S/p successful FNA of left hepatic lobe lesion and core Biopsy followed by MWA ablation   Discharged Condition: Good, stable.   Hospital Course: Karen [name] is a 81 y.o. female with probable metastatic colon cancer to the liver who has been seen in IR clinic on 08/10/14 and deemed a candidate for image guided hepatic mass biopsy followed by thermal ablation.   She presented on 09/01/14 for an elective image guided left hepatic lobe lesion FNA and core biopsy followed by thermal ablation with general anesthesia. She underwent the procedure without complications. She was extubated without difficulty and was admitted overnight for observation and pain control. She was found to be bradycardic with HR's in 30-40 bpm range. She was asymptomatic and denied any lightheadedness or dizziness. She did c/o left hepatic epigastric constant pain that radiated up to her left shoulder. ECG was done and was unchanged from pre-procedure without acute findings. Labs were done without acute findings. She was transferred to telemetry floor and her Coreg and Digoxin were held. She was given steroids for pain. Her labs and vitals have been stable and she is afebrile. Her HR today is high 40-70's and with ambulation in the 90's. She admits to some slight tenderness at left hepatic procedure site today that is improved from yesterday and non-radiating. She denies any chest pain or shortness of breath. She denies any active bleeding, fever or chills. She has had her foley catheter removed and has urinated without difficulty or hematuria. She has tolerated a regular diet without nausea or vomiting. She has ambulated the halls without  lightheadedness or dizziness. She states she is ready to be discharged home and is deemed medically stable to do so. She was given the below instructions and prescriptions and will follow up in IR clinic in 2-4 weeks, she verbalizes understanding of the above plan. She was instructed to contact Dr. Mare Ferrari Monday to setup an appointment regarding low HR's and her medications.   Consults: Anesthesia.   Treatments: S/p successful FNA of left hepatic lobe lesion and core Biopsy followed by MWA ablation   Discharge Exam: Blood pressure 110/56, pulse 41, temperature 98.5 F (36.9 C), temperature source Oral, resp. rate 15, height 5' (1.524 m), weight 142 lb (64.411 kg), SpO2 98 %. Current HR 68bpm  PE:  General: A&Ox3, NAD, sitting up in chair doing word search  Heart: RRR without M/G/R Lungs: CTA b/l Abd: Soft, ND, epigastric procedure site dressing C/D/I, minimal tenderness to deep palpation, (+) BS, ostomy intact Ext: Warm b/l, no edema b/l  Disposition: 01-Home or Self Care  Discharge Instructions    Call MD for:  difficulty breathing, headache or visual disturbances    Complete by:  As directed      Call MD for:  extreme fatigue    Complete by:  As directed      Call MD for:  hives    Complete by:  As directed      Call MD for:  persistant dizziness or light-headedness    Complete by:  As directed      Call MD for:  persistant nausea and vomiting    Complete by:  As directed      Call MD for:  redness,  tenderness, or signs of infection (pain, swelling, redness, odor or green/yellow discharge around incision site)    Complete by:  As directed      Call MD for:  severe uncontrolled pain    Complete by:  As directed      Call MD for:  temperature >100.4    Complete by:  As directed      Diet - low sodium heart healthy    Complete by:  As directed      Driving Restrictions    Complete by:  As directed   No driving x 3 days     Increase activity slowly    Complete by:  As  directed      Lifting restrictions    Complete by:  As directed   No lifting over 20 lbs x 1 week     Remove dressing in 24 hours    Complete by:  As directed             Medication List    TAKE these medications        ALPRAZolam 0.5 MG tablet  Commonly known as:  XANAX  Take 0.5 mg by mouth every 6 (six) hours as needed for anxiety.     amitriptyline 10 MG tablet  Commonly known as:  ELAVIL  Take 20 mg by mouth at bedtime.     aspirin EC 325 MG tablet  Take 325 mg by mouth daily.     bifidobacterium infantis capsule  Take 1 capsule by mouth daily.     carvedilol 12.5 MG tablet  Commonly known as:  COREG  Take 1 tablet (12.5 mg total) by mouth 2 (two) times daily with a meal.     ciprofloxacin 500 MG tablet  Commonly known as:  CIPRO  Take 1 tablet (500 mg total) by mouth 2 (two) times daily.     digoxin 0.125 MG tablet  Commonly known as:  LANOXIN  TAKE ONE TABLET BY MOUTH ONCE DAILY     EQL GUMMY ADULT Chew  Chew 2 tablets by mouth daily.     Fish Oil 1000 MG Caps  Take 1 capsule by mouth daily.     HYDROcodone-acetaminophen 5-325 MG per tablet  Commonly known as:  NORCO/VICODIN  Take 1 tablet by mouth every 4 (four) hours as needed for moderate pain.     hyoscyamine 0.125 MG SL tablet  Commonly known as:  LEVSIN SL  Place 0.125 mg under the tongue every 4 (four) hours as needed (lower abdominal pain).     ketoconazole 2 % cream  Commonly known as:  NIZORAL  Apply 1 application topically 2 (two) times daily. Apply to feet twice daily     levothyroxine 75 MCG tablet  Commonly known as:  SYNTHROID, LEVOTHROID  Take 75 mcg by mouth daily.     losartan-hydrochlorothiazide 100-12.5 MG per tablet  Commonly known as:  HYZAAR  Take 1 tablet by mouth every morning.     NITROSTAT 0.4 MG SL tablet  Generic drug:  nitroGLYCERIN  DISSOLVE ONE TABLET UNDER THE TONGUE EVERY 5 MINUTES AS NEEDED FOR CHEST PAIN.  DO NOT EXCEED A TOTAL OF 3 DOSES IN 15 MINUTES       ondansetron 4 MG disintegrating tablet  Commonly known as:  ZOFRAN-ODT  Take 1 tablet (4 mg total) by mouth every 8 (eight) hours as needed for nausea or vomiting.     pantoprazole 40 MG tablet  Commonly known as:  PROTONIX  Take 40 mg by mouth 2 (two) times daily.     triamcinolone lotion 0.1 %  Commonly known as:  KENALOG  Apply 1 application topically every 8 (eight) hours.           Follow-up Information    Follow up with [facility], [facility], MD In 2 weeks.   Specialties:  Interventional Radiology, Radiology   Why:  Our office will call patient with follow-up appointment in 2-4 weeks with CMP labs [phone]   Contact information:   [address] [place] [address] [phone]        Signed: Hedy Jacob 09/02/2014, 9:52 AM   I have spent Greater Than 30 Minutes discharging Karen [name].

## 2014-09-04 ENCOUNTER — Encounter (HOSPITAL_COMMUNITY): Payer: Medicare Other | Attending: Hematology and Oncology | Admitting: Hematology & Oncology

## 2014-09-04 ENCOUNTER — Encounter (HOSPITAL_COMMUNITY): Payer: Self-pay | Admitting: Hematology & Oncology

## 2014-09-04 ENCOUNTER — Telehealth: Payer: Self-pay | Admitting: Cardiology

## 2014-09-04 VITALS — BP 108/52 | HR 55 | Temp 98.2°F | Resp 18 | Wt 142.8 lb

## 2014-09-04 DIAGNOSIS — C787 Secondary malignant neoplasm of liver and intrahepatic bile duct: Secondary | ICD-10-CM

## 2014-09-04 DIAGNOSIS — D509 Iron deficiency anemia, unspecified: Secondary | ICD-10-CM | POA: Insufficient documentation

## 2014-09-04 DIAGNOSIS — C189 Malignant neoplasm of colon, unspecified: Secondary | ICD-10-CM | POA: Diagnosis not present

## 2014-09-04 NOTE — Progress Notes (Signed)
[name], MD [place] / [place] [address]   DIAGNOSIS: Colon carcinoma with permanent ostomy Diversion Colitis Achalasia Iron deficiency, anemia secondary to GI related blood loss Hemeoccult positive stools Rising CEA with CT imaging on 07/13/2014 1.3 cm mass in L hepatic lobe Subcapsular mass in anterior pole of L kidney suspicious for RCC  CURRENT THERAPY: IV iron, intolerance to oral iron and Observation  History of Present Illness: Jaquetta presents today for additional discussion of a rising CEA and probable recurrence of her colorectal carcinoma as a solitary liver metastases. She also has a renal lesion that is suspicious for renal cell carcinoma.  The patient is present today in a wheelchair with her daughter.    She has recently had a liver biopsy and ablation done.  During the process, she did not take the anesthesia well.  Her heart rate dropped and she was taken to ICU.  She has not taken her Digoxin the past 2 days due to the cardiac side effects.   The patient has a pinched nerve in her neck and back.  She was supposed to have injections last Wednesday however called and cancelled due to pain and irritation from hernias and her ablation.  She has been taking Prednisone and it has help alleviate the pain in her hip and groin.  She gets hungry but she does not have an appetite for anything.  She does eat potato soup as her "comfort" food and she makes banana shakes.  The patient complains of pain in her throat that comes and goes.  She has been consulted about this and advised to take nitroglycerin however it does not help   She states that she has been very weak and her legs have been wobbly.  She says that she usually "bounces back" after being hospitalized however, she is not doing that this time.  MEDICAL HISTORY: Past Medical History  Diagnosis Date  . Chronic diastolic CHF (congestive heart failure)     a. nl EF by echo 05/2009.  Marland Kitchen DVT of deep  femoral vein   . Palpitations   . History of PSVT (paroxysmal supraventricular tachycardia)   . Hypertension   . Abdominal adhesions   . Ileostomy present   . Colon polyp   . Chest pain     a. 2002 Cath: nl cors;  b. 2009 aden mv: nl;  c. 05/2009 Echo: nl.  . Wears dentures   . Breast lump   . Hyperlipidemia   . Thyroid disease   . Anemia   . History of blood clots   . Hernia   . Blood transfusion   . Hearing loss   . Arthritis   . GERD (gastroesophageal reflux disease)   . Cancer     bladder  . Colon cancer     x 2. Last 2010  . Iron deficiency anemia due to chronic blood loss 07/06/2013  . UTI (urinary tract infection)   . Obstruction of intestine or colon 06/2014  . Shortness of breath dyspnea     at times  . Diabetes mellitus without complication 1/70/01    borderline type II; takes no medication for it  . Vision abnormalities 2016    not going blind but having retina problems; might lose ability to see faces  . LBBB (left bundle branch block)   . Anginal pain     occasionally  . Asthma     hx of  . Depression   .  Chronic kidney disease     pt. states decreased kidney function  . Headache   . Fibromyalgia   . History of hiatal hernia     botox injection for achalasia    has Left bundle branch block; Chest pain, exertional; Benign hypertensive heart disease without heart failure; Hypothyroid; Small bowel obstruction, partial; Dysphagia; Paroxysmal SVT (supraventricular tachycardia); Ileostomy status; Colon cancer; Unstable angina; Parastomal hernia; Iron deficiency anemia due to chronic blood loss; Malabsorption of iron; Acute abdominal pain; Acute renal failure; Hyperglycemia; UTI (urinary tract infection); Small bowel obstruction; Abdominal pain, acute; Liver mass, left lobe; Colon cancer metastasized to liver; and Liver lesion on her problem list.     is allergic to bactrim; codeine; demerol; lidocaine hcl; morphine and related; nitrofurantoin monohyd macro;  lisinopril; and ramipril.   Iron Infusion in March 2016. Mammogram performed in 2015. Reported multiple hernias.   SURGICAL HISTORY: Past Surgical History  Procedure Laterality Date  . Cardiac catheterization  12/10/2000    THE LEFT VENTRICLE IS MILDY DILATED. THERE IS MILD TO MODERATE DIFFUSE HYPOKINESIS WITH EF 35%  . Colon cancer surgery    . Ileostomy    . Esophagogastroduodenoscopy  02/13/2011    Procedure: ESOPHAGOGASTRODUODENOSCOPY (EGD);  Surgeon: Rogene Houston, MD;  [facility]: [facility];  Service: Endoscopy;  Laterality: N/A;  1030  . Colonoscopy  09/26/2011    Procedure: COLONOSCOPY;  Surgeon: Rogene Houston, MD;  Location: [facility];  Service: Endoscopy;  Laterality: N/A;  215  . Cataract extraction w/phaco  11/13/2011    Procedure: CATARACT EXTRACTION PHACO AND INTRAOCULAR LENS PLACEMENT (IOC);  Surgeon: Tonny Branch, MD;  Location: [facility];  Service: Ophthalmology;  Laterality: Right;  CDE 12.26  . Cataract extraction w/phaco  12/08/2011    Procedure: CATARACT EXTRACTION PHACO AND INTRAOCULAR LENS PLACEMENT (IOC);  Surgeon: Tonny Branch, MD;  Location: [facility];  Service: Ophthalmology;  Laterality: Left;  CDE 15.11  . Esophagogastroduodenoscopy N/A 09/01/2013    Procedure: ESOPHAGOGASTRODUODENOSCOPY (EGD);  Surgeon: Rogene Houston, MD;  [facility];  Service: Endoscopy;  Laterality: N/A;  830  . Botox injection N/A 09/01/2013    Procedure: BOTOX INJECTION;  Surgeon: Rogene Houston, MD;  Location: [facility];  Service: Endoscopy;  Laterality: N/A;  . Abdominal hysterectomy    . Cholecystectomy    . Joint replacement Left 2008  . Tonsillectomy    . Colon surgery    . Eye surgery  2014    catarac surgery  . Appendectomy    . Ovarian cystectomy 1955    . Ileostomy    . Coronary angioplasty    . Rotator cuff repair    . Breast surgery      right breast biopsy  . Liver biopsy and ablation  09/01/14    SOCIAL HISTORY: History   Social History  .  Marital Status: Married    Spouse Name: N/A  . Number of Children: N/A  . Years of Education: N/A   Occupational History  . Not on file.   Social History Main Topics  . Smoking status: Former Smoker    Quit date: 02/11/1963  . Smokeless tobacco: Never Used  . Alcohol Use: No  . Drug Use: No  . Sexual Activity: Not on file   Other Topics Concern  . Not on file   Social History Narrative   Reads inspirational books   FAMILY HISTORY: Family History  Problem Relation Age of Onset  . Stroke    .  Hypertension    . Heart disease Mother   . Stroke Mother     deceased  . Cancer Mother     bladder  . Arthritis Mother   . Arthritis Father   . Heart disease Father     decesaed  . Cancer Father     leukemia  . Stroke Sister     alive/debilitated  . Hypertension Sister   . Other Sister     paralysis  . Heart disease Brother     bypass surgery  . Cancer Brother   . Arthritis Brother   . Stroke Sister     alive/debilitated  . Diabetes Sister   . Other Brother     stomach problems  . Other Brother     bladder    Review of Systems  Constitutional: Negative for fever, chills, weight loss and malaise/fatigue.  HENT: Positive for hearing loss. Negative for congestion, nosebleeds, sore throat and tinnitus.   Eyes: Negative for blurred vision, double vision, pain and discharge.  Respiratory: Negative for cough, hemoptysis, sputum production, shortness of breath and wheezing.   Cardiovascular: Negative for chest pain, palpitations, claudication, leg swelling and PND.  Gastrointestinal:  Negative for heartburn, abdominal pain, diarrhea, constipation, blood in stool and melena.  Genitourinary: Negative for dysuria, urgency, frequency and hematuria.  Musculoskeletal: Positive for joint pain. Negative for myalgias and falls.  Skin: Negative for itching and rash.  Neurological: Positive for weakness. Negative for dizziness, tingling, tremors, sensory change, speech change,  focal weakness, seizures, loss of consciousness and headaches.  Endo/Heme/Allergies: Does not bruise/bleed easily.  Psychiatric/Behavioral: Negative for depression, suicidal ideas, memory loss and substance abuse. The patient is not nervous/anxious and does not have insomnia.   14 point review of systems was performed and is negative except as detailed under history of present illness and above  PHYSICAL EXAMINATION  ECOG PERFORMANCE STATUS: 1 - Symptomatic but completely ambulatory  Filed Vitals:   09/04/14 1027  BP: 108/52  Pulse: 55  Temp: 98.2 F (36.8 C)  Resp: 18    Physical Exam  Constitutional: She is oriented to person, place, and time and well-developed, well-nourished, and in no distress. In a wheelchair. Gets onto the exam table with assistance HENT:  Head: Normocephalic and atraumatic.  Nose: Nose normal.  Mouth/Throat: Oropharynx is clear and moist. No oropharyngeal exudate.  Eyes: Conjunctivae and EOM are normal. Pupils are equal, round, and reactive to light. Right eye exhibits no discharge. Left eye exhibits no discharge. No scleral icterus.  Neck: Normal range of motion. Neck supple. No tracheal deviation present. No thyromegaly present.  Cardiovascular: Normal rate, regular rhythm and normal heart sounds.  Exam reveals no gallop and no friction rub.   No murmur heard. Pulmonary/Chest: Effort normal and breath sounds normal. She has no wheezes. She has no rales.  Abdominal: Soft. Bowel sounds are normal. She exhibits no distension and no mass. There is no tenderness. There is no rebound and no guarding.  Musculoskeletal: Normal range of motion. She exhibits no edema.  Lymphadenopathy:    She has no cervical adenopathy.  Neurological: She is alert and oriented to person, place, and time. She has normal reflexes. No cranial nerve deficit. Gait normal. Coordination normal.  Skin: Skin is warm and dry. No rash noted.  Psychiatric: Mood, memory, affect and judgment  normal.  Nursing note and vitals reviewed.    LABORATORY DATA:  CBC    Component Value Date/Time   WBC 5.2 09/02/2014 0346  RBC 3.82* 09/02/2014 0346   RBC 3.45* 11/24/2008 1500   HGB 10.8* 09/02/2014 0346   HCT 33.4* 09/02/2014 0346   PLT 149* 09/02/2014 0346   MCV 87.4 09/02/2014 0346   MCH 28.3 09/02/2014 0346   MCHC 32.3 09/02/2014 0346   RDW 12.8 09/02/2014 0346   LYMPHSABS 0.6* 09/01/2014 1840   MONOABS 0.4 09/01/2014 1840   EOSABS 0.0 09/01/2014 1840   BASOSABS 0.0 09/01/2014 1840   CMP     Component Value Date/Time   NA 139 09/02/2014 0346   K 4.0 09/02/2014 0346   CL 106 09/02/2014 0346   CO2 25 09/02/2014 0346   GLUCOSE 160* 09/02/2014 0346   BUN 31* 09/02/2014 0346   CREATININE 1.31* 09/02/2014 0346   CREATININE 1.50* 08/15/2014 1356   CALCIUM 9.0 09/02/2014 0346   PROT 6.8 09/02/2014 0346   ALBUMIN 3.5 09/02/2014 0346   AST 147* 09/02/2014 0346   ALT 95* 09/02/2014 0346   ALKPHOS 64 09/02/2014 0346   BILITOT 0.9 09/02/2014 0346   GFRNONAA 37* 09/02/2014 0346   GFRNONAA 32* 08/15/2014 1356   GFRAA 43* 09/02/2014 0346   GFRAA 37* 08/15/2014 1356   RADIOLOGY: I have reviewed the images detailed below and agree with the results:   CLINICAL DATA: Elevated CEA levels. Colorectal carcinoma. Previous surgery. Evaluate for recurrent or metastatic disease.  EXAM: CT CHEST, ABDOMEN, AND PELVIS WITH CONTRAST  TECHNIQUE: Multidetector CT imaging of the chest, abdomen and pelvis was performed following the standard protocol during bolus administration of intravenous contrast.  CONTRAST: 87mL OMNIPAQUE IOHEXOL 300 MG/ML SOLN  COMPARISON: 01/04/2013  FINDINGS: CT CHEST FINDINGS  Mediastinum/Lymph Nodes: No masses or pathologically enlarged lymph nodes identified. Small less than 1 cm mediastinal lymph nodes in the right paratracheal region and right cardiophrenic angle are stable and not pathologically enlarged.  Lungs/Pleura: No  pulmonary infiltrate or mass identified. No effusion present.  Musculoskeletal/Soft Tissues: No suspicious bone lesions or other significant chest wall abnormality.  CT ABDOMEN AND PELVIS FINDINGS  Hepatobiliary: Hepatic steatosis again noted. A new 1.3 cm hypovascular mass is seen in segment 2 of the left hepatic lobe, consistent with liver metastasis. No other liver masses are identified. A tiny sub-cm cyst in the caudate lobe appears stable.  Pancreas: No mass, inflammatory changes, or other significant abnormality identified.  Spleen: Within normal limits in size and appearance.  Adrenals: No masses identified.  Kidneys/Urinary Tract: Small benign-appearing right renal cyst again noted. A subcapsular lesion in the anterior lower pole left kidney shows mild increase in size, now measuring 1.2 cm compared to 1.0 cm previously. This shows possible contrast enhancement with washout on delayed imaging, suspicious for a solid renal neoplasm. No evidence of hydronephrosis.  Stomach/Bowel/Peritoneum: No evidence of wall thickening, mass, or obstruction. Stable postoperative changes from right colectomy and right lower quadrant ileostomy. Sigmoid diverticulosis is noted, without evidence of diverticulitis.  Vascular/Lymphatic: No pathologically enlarged lymph nodes identified. No other significant abnormality visualized.  Reproductive: No mass or other significant abnormality identified.  Other: Left hip prosthesis causes significant beam hardening artifact obscuring the inferior pelvis.  Musculoskeletal: No suspicious bone lesions identified.  IMPRESSION: New 1.3 cm hypovascular mass in segment 2 of the left hepatic lobe, consistent with liver metastasis.  No other sites of metastatic disease identified within the chest, abdomen, or pelvis.  Further mild enlargement of 1.2 cm subcapsular mass in the anterior lower pole of left kidney, suspicious for a  solid renal neoplasm/renal cell carcinoma. Recommend abdomen MRI  without and with contrast for further characterization. This recommendation follows ACR consensus guidelines: Managing Incidental Findings on Abdominal CT: White Paper of the ACR Incidental Findings Committee. [facility] [phone]   Electronically Signed  By: Earle Gell M.D.     ASSESSMENT and THERAPY PLAN:  Colon carcinoma with permanent ostomy Diversion Colitis Achalasia Iron deficiency, anemia secondary to GI related blood loss Hemeoccult positive stools Rising CEA with CT imaging on 07/13/2014 1.3 cm mass in L hepatic lobe Subcapsular mass in anterior pole of L kidney suspicious for RCC Biopsy of L liver lesion and MWA Left liver lesion 09/01/2014  She has undergone ablation of a left liver lesion. She has follow-up with interventional radiology within 4-6 weeks. She had difficulties with bradycardia after the procedure and was admitted overnight. She states she is currently been holding her digoxin. She has called her cardiologist for a follow-up appointment.  I advised the patient that we will not check a CEA for several weeks moving forward. I will tentatively see her back in 6 weeks.  She also has seen urology and ongoing observation of the kidney lesion is recommended.  She notes she was given prednisone and her right hip and groin pain is resolved.  She continues to complain of multiple other nonspecific issues, she seems to understand that some of these are just unfortunately age related.  She does need one dose of IV iron and we will arrange this.  All questions were answered. The patient knows to call the clinic with any problems, questions or concerns. We can certainly see the patient much sooner if necessary.   This document was electronically signed.  This document serves as a record of services personally performed by Ancil Linsey, MD. It was created on her behalf by Janace Hoard, a trained medical scribe. The creation of this record is based on the scribe's personal observations and the provider's statements to them. This document has been checked and approved by the attending provider.  I have reviewed the above documentation for accuracy and completeness, and I agree with the above.  Kelby Fam. Whitney Muse, MD

## 2014-09-04 NOTE — Telephone Encounter (Signed)
Patient calling about her HR dropping while in the hospital. Patient stated her BP was 172/80 on Saturday and 109/50 on Sunday. Patient did not have a HR to report. On Tsosie Billing PA's discharge summary notes stated "She was instructed to contact Dr. Mare Ferrari Monday to setup an appointment regarding low HR's and her medications." Patient stated she was feeling weak, but was unable to give too much detail, because she had to leave to go to her oncology appointment. Informed patient that message will be sent to Dr. Mare Ferrari and his nurse Rip Harbour, and they are back in the office tomorrow. Informed patient to give our office a call with any other questions or concerns.

## 2014-09-04 NOTE — Telephone Encounter (Signed)
New Message  Pt stated had surgery on Fri (7/22) and HR dropped to 33, and pt's surgeon told her to contact office for visit sometime soon. Please use home # first, then cell #, can leave detailed VM. Please call back and discuss.

## 2014-09-04 NOTE — Patient Instructions (Signed)
[place] at [facility] Discharge Instructions  RECOMMENDATIONS MADE BY THE CONSULTANT AND ANY TEST RESULTS WILL BE SENT TO YOUR REFERRING PHYSICIAN.  Exam and discussion by Dr. Whitney Muse Will get you set up for an iron infusion.  Once authorization is obtained our scheduler will contact you with the date and time of your appointment. Call with any issues or concerns. Will see you back in 6 weeks with labs and office visit.   Thank you for choosing [place] at [facility] to provide your oncology and hematology care.  To afford each patient quality time with our provider, please arrive at least 15 minutes before your scheduled appointment time.    You need to re-schedule your appointment should you arrive 10 or more minutes late.  We strive to give you quality time with our providers, and arriving late affects you and other patients whose appointments are after yours.  Also, if you no show three or more times for appointments you may be dismissed from the clinic at the providers discretion.     Again, thank you for choosing [facility].  Our hope is that these requests will decrease the amount of time that you wait before being seen by our physicians.       _____________________________________________________________  Should you have questions after your visit to [facility], please contact our office at [phone] between the hours of 8:30 a.m. and 4:30 p.m.  Voicemails left after 4:30 p.m. will not be returned until the following business day.  For prescription refill requests, have your pharmacy contact our office.

## 2014-09-05 NOTE — Telephone Encounter (Signed)
Karen [name], please call patient today and get an update on her situation. Thanks, TAB

## 2014-09-05 NOTE — Telephone Encounter (Signed)
Spoke with patient and she has not checked her blood pressure or heart rate today.  States she is still just weak feeling  Patient is scheduled for IV iron next week Scheduled appointment for her to see  Dr. Mare Ferrari in the morning

## 2014-09-05 NOTE — Telephone Encounter (Signed)
Patient aware of appointment date and time. 

## 2014-09-05 NOTE — Telephone Encounter (Signed)
Follow Up       Pt calling back to f/u on previous message. Pt states that someone was suppose to call her today and that she needs to be seen this week. Please call back and advise.

## 2014-09-06 ENCOUNTER — Ambulatory Visit (INDEPENDENT_AMBULATORY_CARE_PROVIDER_SITE_OTHER): Payer: Medicare Other | Admitting: Cardiology

## 2014-09-06 ENCOUNTER — Encounter: Payer: Self-pay | Admitting: Cardiology

## 2014-09-06 ENCOUNTER — Other Ambulatory Visit: Payer: Self-pay | Admitting: Radiology

## 2014-09-06 VITALS — BP 94/40 | HR 62 | Ht 60.0 in

## 2014-09-06 DIAGNOSIS — I959 Hypotension, unspecified: Secondary | ICD-10-CM | POA: Diagnosis not present

## 2014-09-06 DIAGNOSIS — R079 Chest pain, unspecified: Secondary | ICD-10-CM | POA: Diagnosis not present

## 2014-09-06 DIAGNOSIS — C787 Secondary malignant neoplasm of liver and intrahepatic bile duct: Secondary | ICD-10-CM

## 2014-09-06 DIAGNOSIS — R001 Bradycardia, unspecified: Secondary | ICD-10-CM | POA: Insufficient documentation

## 2014-09-06 DIAGNOSIS — I447 Left bundle-branch block, unspecified: Secondary | ICD-10-CM | POA: Diagnosis not present

## 2014-09-06 NOTE — Progress Notes (Signed)
Cardiology Office Note   Date:  09/06/2014   ID:  JEANISE DURFEY, DOB 02-16-1929, MRN [id number]  PCP:  [name], MD  Cardiologist: Darlin Coco MD  No chief complaint on file.     History of Present Illness: Karen [name] is a 81 y.o. female who presents for a post hospital follow-up office visit.  This pleasant 81 year old woman is seen for a scheduled followup office visit. She has a complex past medical history. She has a known left bundle branch block. She does not have coronary disease. She had a normal coronary arteriogram in 2002 and a normal adenosine Cardiolite stress test in 2009. She had an echocardiogram in April 2011 showing normal systolic function with diastolic dysfunction. In April 2014 she complained of chest and jaw pain and underwent a Lexus scan Myoview stress test on 06/03/12 which was normal and showed an ejection fraction of 69% and no ischemia. He's had a past history of swallowing problems secondary to achalasia and has had successful endoscopic injections of Botox into her esophagus. Her last Botox treatment was in July 2015. She has a remote history of a severe intra-abdominal infection resulting in the need for an ileostomy. She has a past history of colon cancer twice and a history of colon polyps.Recently her cancer has recurred.  On 09/01/14 she underwent percutaneous liver biopsy and thermal ablation of a liver metastasis by interventional radiology.  They kept her overnight for observation.  In the hospital her heart rate was as low as the 30s and her digoxin has been held.  She has felt weak and dizzy and her blood pressure here in the office today is low.   Past Medical History  Diagnosis Date  . Chronic diastolic CHF (congestive heart failure)     a. nl EF by echo 05/2009.  Marland Kitchen DVT of deep femoral vein   . Palpitations   . History of PSVT (paroxysmal supraventricular tachycardia)   . Hypertension   . Abdominal adhesions   . Ileostomy  present   . Colon polyp   . Chest pain     a. 2002 Cath: nl cors;  b. 2009 aden mv: nl;  c. 05/2009 Echo: nl.  . Wears dentures   . Breast lump   . Hyperlipidemia   . Thyroid disease   . Anemia   . History of blood clots   . Hernia   . Blood transfusion   . Hearing loss   . Arthritis   . GERD (gastroesophageal reflux disease)   . Cancer     bladder  . Colon cancer     x 2. Last 2010  . Iron deficiency anemia due to chronic blood loss 07/06/2013  . UTI (urinary tract infection)   . Obstruction of intestine or colon 06/2014  . Shortness of breath dyspnea     at times  . Diabetes mellitus without complication 3/81/01    borderline type II; takes no medication for it  . Vision abnormalities 2016    not going blind but having retina problems; might lose ability to see faces  . LBBB (left bundle branch block)   . Anginal pain     occasionally  . Asthma     hx of  . Depression   . Chronic kidney disease     pt. states decreased kidney function  . Headache   . Fibromyalgia   . History of hiatal hernia     botox injection for achalasia  Past Surgical History  Procedure Laterality Date  . Cardiac catheterization  12/10/2000    THE LEFT VENTRICLE IS MILDY DILATED. THERE IS MILD TO MODERATE DIFFUSE HYPOKINESIS WITH EF 35%  . Colon cancer surgery    . Ileostomy    . Esophagogastroduodenoscopy  02/13/2011    Procedure: ESOPHAGOGASTRODUODENOSCOPY (EGD);  Surgeon: Rogene Houston, MD;  [facility]: [facility];  Service: Endoscopy;  Laterality: N/A;  1030  . Colonoscopy  09/26/2011    Procedure: COLONOSCOPY;  Surgeon: Rogene Houston, MD;  Location: [facility];  Service: Endoscopy;  Laterality: N/A;  215  . Cataract extraction w/phaco  11/13/2011    Procedure: CATARACT EXTRACTION PHACO AND INTRAOCULAR LENS PLACEMENT (IOC);  Surgeon: Tonny Branch, MD;  Location: [facility];  Service: Ophthalmology;  Laterality: Right;  CDE 12.26  . Cataract extraction w/phaco  12/08/2011    Procedure:  CATARACT EXTRACTION PHACO AND INTRAOCULAR LENS PLACEMENT (IOC);  Surgeon: Tonny Branch, MD;  Location: [facility];  Service: Ophthalmology;  Laterality: Left;  CDE 15.11  . Esophagogastroduodenoscopy N/A 09/01/2013    Procedure: ESOPHAGOGASTRODUODENOSCOPY (EGD);  Surgeon: Rogene Houston, MD;  [facility]: [facility];  Service: Endoscopy;  Laterality: N/A;  830  . Botox injection N/A 09/01/2013    Procedure: BOTOX INJECTION;  Surgeon: Rogene Houston, MD;  Location: [facility];  Service: Endoscopy;  Laterality: N/A;  . Abdominal hysterectomy    . Cholecystectomy    . Joint replacement Left 2008  . Tonsillectomy    . Colon surgery    . Eye surgery  2014    catarac surgery  . Appendectomy    . Ovarian cystectomy 1955    . Ileostomy    . Coronary angioplasty    . Rotator cuff repair    . Breast surgery      right breast biopsy  . Liver biopsy and ablation  09/01/14     Current Outpatient Prescriptions  Medication Sig Dispense Refill  . ALPRAZolam (XANAX) 0.5 MG tablet Take 0.5 mg by mouth every 6 (six) hours as needed for anxiety.     Marland Kitchen amitriptyline (ELAVIL) 10 MG tablet Take 20 mg by mouth at bedtime.     Marland Kitchen aspirin EC 325 MG tablet Take 325 mg by mouth daily.    . bifidobacterium infantis (ALIGN) capsule Take 1 capsule by mouth daily. 14 capsule 0  . carvedilol (COREG) 12.5 MG tablet Take 1 tablet (12.5 mg total) by mouth 2 (two) times daily with a meal. 180 tablet 3  . ciprofloxacin (CIPRO) 500 MG tablet Take 1 tablet (500 mg total) by mouth 2 (two) times daily. 14 tablet 0  . HYDROcodone-acetaminophen (NORCO/VICODIN) 5-325 MG per tablet Take 1 tablet by mouth every 4 (four) hours as needed for moderate pain.    . hyoscyamine (LEVSIN SL) 0.125 MG SL tablet Place 0.125 mg under the tongue every 4 (four) hours as needed (lower abdominal pain).    Marland Kitchen ketoconazole (NIZORAL) 2 % cream Apply 1 application topically 2 (two) times daily. Apply to feet twice daily    . levothyroxine (SYNTHROID,  LEVOTHROID) 75 MCG tablet Take 75 mcg by mouth daily.     Marland Kitchen losartan-hydrochlorothiazide (HYZAAR) 100-12.5 MG per tablet Take by mouth as directed. 1/2 TABLET DAILY. DO NOT TAKE IF YOUR TOP BLOOD PRESSURE NUMBER IS LESS THAN 110    . Multiple Vitamins-Minerals (EQL GUMMY ADULT) CHEW Chew 2 tablets by mouth daily.     Marland Kitchen NITROSTAT 0.4 MG SL tablet  DISSOLVE ONE TABLET UNDER THE TONGUE EVERY 5 MINUTES AS NEEDED FOR CHEST PAIN.  DO NOT EXCEED A TOTAL OF 3 DOSES IN 15 MINUTES 25 tablet 0  . Omega-3 Fatty Acids (FISH OIL) 1000 MG CAPS Take 1 capsule by mouth daily.    . ondansetron (ZOFRAN-ODT) 4 MG disintegrating tablet Take 1 tablet (4 mg total) by mouth every 8 (eight) hours as needed for nausea or vomiting. 20 tablet 0  . pantoprazole (PROTONIX) 40 MG tablet Take 40 mg by mouth 2 (two) times daily.    Marland Kitchen triamcinolone lotion (KENALOG) 0.1 % Apply 1 application topically every 8 (eight) hours.     No current facility-administered medications for this visit.    Allergies:   Bactrim; Codeine; Demerol; Lidocaine hcl; Morphine and related; Nitrofurantoin monohyd macro; Lisinopril; and Ramipril    Social History:  The patient  reports that she quit smoking about 51 years ago. She has never used smokeless tobacco. She reports that she does not drink alcohol or use illicit drugs.   Family History:  The patient's family history includes Arthritis in her brother, father, and mother; Cancer in her brother, father, and mother; Diabetes in her sister; Heart disease in her brother, father, and mother; Hypertension in her sister and another family member; Other in her brother, brother, and sister; Stroke in her mother, sister, sister, and another family member.    ROS:  Please see the history of present illness.   Otherwise, review of systems are positive for none.   All other systems are reviewed and negative.    PHYSICAL EXAM: VS:  BP 94/40 mmHg  Pulse 62  Ht 5' (1.524 m)  Wt  , BMI There is no weight on  file to calculate BMI. GEN: Well nourished, well developed, in no acute distress HEENT: normal Neck: no JVD, carotid bruits, or masses Cardiac: RRR; no murmurs, rubs, or gallops,no edema  Respiratory:  clear to auscultation bilaterally, normal work of breathing GI: soft, nontender, nondistended, + BS MS: no deformity or atrophy Skin: warm and dry, no rash Neuro:  Strength and sensation are intact Psych: euthymic mood, full affect   EKG:  EKG is ordered today. The ekg ordered today demonstrates normal sinus rhythm with left bundle branch block.  Heart rate today is 62 bpm.  Since previous tracing of 09/01/14, the heart rate has increased.   Recent Labs: 06/15/2014: TSH 3.572 09/01/2014: Magnesium 1.6* 09/02/2014: ALT 95*; BUN 31*; Creatinine, Ser 1.31*; Hemoglobin 10.8*; Platelets 149*; Potassium 4.0; Sodium 139    Lipid Panel    Component Value Date/Time   CHOL 180 05/23/2014 1000   TRIG 207* 05/23/2014 1000   HDL 45 05/23/2014 1000   CHOLHDL 4.0 05/23/2014 1000   VLDL 41* 05/23/2014 1000   LDLCALC 94 05/23/2014 1000      Wt Readings from Last 3 Encounters:  09/04/14 142 lb 12.8 oz (64.774 kg)  09/01/14 142 lb (64.411 kg)  08/30/14 142 lb (64.411 kg)        ASSESSMENT AND PLAN:  1.  Colon cancer with metastases to liver 2.  Essential hypertension.  Presently low blood pressure 3.  Past history of paroxysmal supraventricular tachycardia 4.  Left bundle-branch block 5.  History of chest pain.  Multiple normal Myoview stress tests in the past 6.  History of diastolic dysfunction 7.  Hypothyroidism  Current medicines are reviewed at length with the patient today.  The patient has concerns regarding medicines.  The following changes have been made:  We are stopping her digoxin because of sinus bradycardia.  We are going to reduce her losartan HCT to just one half tablet daily and she will hold it if her blood pressure is less than 144 systolic  Labs/ tests ordered  today include:  Orders Placed This Encounter  Procedures  . EKG 12-Lead     Disposition: Recheck in 3 months for follow-up office visit  Signed, Darlin Coco MD 09/06/2014 1:23 PM    [address] [place], [place],   [address] Phone: [phone]; Fax: [phone]

## 2014-09-06 NOTE — Patient Instructions (Signed)
Medication Instructions:  STOP YOUR DIGOXIN  DECREASE YOUR LOSARTAN HCT TO 1/2 TABLET DAILY. DO NOT TAKE AT ALL IF YOUR TO NUMBER OF YOUR BLOOD PRESSURE IS LESS THAN 110  Labwork: NONE  Testing/Procedures: NONE  Follow-Up: Your physician recommends that you schedule a follow-up appointment in: 3 MONTH OV

## 2014-09-11 ENCOUNTER — Ambulatory Visit (INDEPENDENT_AMBULATORY_CARE_PROVIDER_SITE_OTHER): Payer: Medicare Other | Admitting: Internal Medicine

## 2014-09-11 ENCOUNTER — Encounter (INDEPENDENT_AMBULATORY_CARE_PROVIDER_SITE_OTHER): Payer: Self-pay | Admitting: Internal Medicine

## 2014-09-11 VITALS — BP 124/60 | HR 68 | Temp 97.7°F | Resp 18 | Ht 60.0 in | Wt 140.5 lb

## 2014-09-11 DIAGNOSIS — R1030 Lower abdominal pain, unspecified: Secondary | ICD-10-CM | POA: Diagnosis not present

## 2014-09-11 DIAGNOSIS — K220 Achalasia of cardia: Secondary | ICD-10-CM

## 2014-09-11 DIAGNOSIS — K5289 Other specified noninfective gastroenteritis and colitis: Secondary | ICD-10-CM

## 2014-09-11 MED ORDER — HYOSCYAMINE SULFATE 0.125 MG SL SUBL: 0.1250 mg | SUBLINGUAL_TABLET | Freq: Four times a day (QID) | SUBLINGUAL | Status: DC | PRN

## 2014-09-11 NOTE — Progress Notes (Signed)
Presenting complaint;  Follow-up for a chalasia abdominal pain and rectal discharge.  Subjective:  Patient is 81 year old Caucasian female who is here for scheduled visit accompanied by her daughter Karen [name]. She was last seen on 05/29/2014. She was found have elevated CEA and on workup found to have a lesion involving left lobe of the liver. Biopsy confirmed metastatic colon carcinoma and she underwent thermal ablation on 09/01/2014. She developed bradycardia and was admitted overnight. She states his taken her long time to recover. She is not having any abdominal or chest pain. She says rectal discharge has decreased and occasionally she may notice it to be blood-tinged. She continues to complain of intermittent lower abdominal cramping which is relieved with Levsin SL and she would like to get new prescription. She states swallowing difficulty is about the same. If she drinks water facet comes right back. She has lost 5 pounds since her last visit. She states she is not having hip and back pain anymore. She states she received few doses of prednisone when she was at [facility] and that seemed to help. She was planning to have sterile injection by Dr. Glenna Fellows was put on hold as she was busy with other healthcare issues. She will have blood work in follow-up CT when she sees Dr. Whitney Muse.   Current Medications: Outpatient Encounter Prescriptions as of 09/11/2014  Medication Sig  . ALPRAZolam (XANAX) 0.5 MG tablet Take 0.5 mg by mouth at bedtime.   Marland Kitchen amitriptyline (ELAVIL) 10 MG tablet Take 20 mg by mouth at bedtime.   Marland Kitchen aspirin EC 325 MG tablet Take 325 mg by mouth daily.  . bifidobacterium infantis (ALIGN) capsule Take 1 capsule by mouth daily.  . carvedilol (COREG) 12.5 MG tablet Take 1 tablet (12.5 mg total) by mouth 2 (two) times daily with a meal.  . HYDROcodone-acetaminophen (NORCO/VICODIN) 5-325 MG per tablet Take 1 tablet by mouth every 4 (four) hours as needed for moderate pain.  . hyoscyamine  (LEVSIN SL) 0.125 MG SL tablet Place 0.125 mg under the tongue every 4 (four) hours as needed (lower abdominal pain).  Marland Kitchen ketoconazole (NIZORAL) 2 % cream Apply 1 application topically 2 (two) times daily. Apply to feet twice daily  . levothyroxine (SYNTHROID, LEVOTHROID) 75 MCG tablet Take 75 mcg by mouth daily.   Marland Kitchen losartan-hydrochlorothiazide (HYZAAR) 100-12.5 MG per tablet Take by mouth as directed. 1/2 TABLET DAILY. DO NOT TAKE IF YOUR TOP BLOOD PRESSURE NUMBER IS LESS THAN 110  . Multiple Vitamins-Minerals (EQL GUMMY ADULT) CHEW Chew 2 tablets by mouth daily.   Marland Kitchen NITROSTAT 0.4 MG SL tablet DISSOLVE ONE TABLET UNDER THE TONGUE EVERY 5 MINUTES AS NEEDED FOR CHEST PAIN.  DO NOT EXCEED A TOTAL OF 3 DOSES IN 15 MINUTES  . Omega-3 Fatty Acids (FISH OIL) 1000 MG CAPS Take 1 capsule by mouth daily.  . ondansetron (ZOFRAN-ODT) 4 MG disintegrating tablet Take 1 tablet (4 mg total) by mouth every 8 (eight) hours as needed for nausea or vomiting.  . pantoprazole (PROTONIX) 40 MG tablet Take 40 mg by mouth 2 (two) times daily.  Marland Kitchen triamcinolone lotion (KENALOG) 0.1 % Apply 1 application topically every 8 (eight) hours.   No facility-administered encounter medications on file as of 09/11/2014.     Objective: Blood pressure 124/60, pulse 68, temperature 97.7 F (36.5 C), temperature source Oral, resp. rate 18, height 5' (1.524 m), weight 140 lb 8 oz (63.73 kg).  Patient is alert and in no acute distress. Conjunctiva is pink. Sclera  is nonicteric Oropharyngeal mucosa is normal. No neck masses or thyromegaly noted. Cardiac exam with regular rhythm normal S1 and S2. No murmur or gallop noted. Lungs are clear to auscultation. Abdomen is asymmetric with fullness and right midabdomen. She has ileostomy in place and back contains greenish liquid stool. On palpation abdomen is soft and nontender without organomegaly or masses.  No LE edema or clubbing noted.  Labs/studies Results:  Multiple imaging studies  reviewed with patient and her daughter in reference to hepatic and left renal lesion.   Assessment:  #1. Achalasia. She has chronic dysphagia. She has had partial response with Botox injection therapy and she has declined surgical intervention in the past. She has learned to eat and drink slowly and therefore able to avoid regurgitation or keep it to minimum. #2. Lower abdominal pain responding to hyoscyamine. Etiology unclear but she may have adhesions or IBS. #3. History of diversion colitis presently with minimal symptoms in the form of rectal discharge. #4. History of metastatic liver lesion secondary to colon primary for which she had surgery in 2010. She has sigmoid colon and rectum in situ and another primary has not been found. She is status post thermal ablation on 09/01/2014. She will have follow-up imaging and CEA on her next visit with Dr. Whitney Muse. #5. Left renal lesion felt to be hemorrhagic or proteinaceous cyst. Patient has been evaluated by Dr. Diona Fanti and no therapy recommended.   Plan:  Patient will call if rectal discharge increases or dysphagia worsens. New prescription given for hyoscyamine sublingual 1 tablet 4 times a day when necessary. Office visit in 6 months.

## 2014-09-11 NOTE — Patient Instructions (Signed)
Call if swallowing difficulty gets worse. 

## 2014-09-12 ENCOUNTER — Encounter (HOSPITAL_COMMUNITY): Payer: Medicare Other | Attending: Hematology and Oncology

## 2014-09-12 VITALS — BP 122/54 | HR 59 | Temp 98.1°F | Resp 18

## 2014-09-12 DIAGNOSIS — D509 Iron deficiency anemia, unspecified: Secondary | ICD-10-CM | POA: Insufficient documentation

## 2014-09-12 DIAGNOSIS — C189 Malignant neoplasm of colon, unspecified: Secondary | ICD-10-CM | POA: Insufficient documentation

## 2014-09-12 DIAGNOSIS — D500 Iron deficiency anemia secondary to blood loss (chronic): Secondary | ICD-10-CM

## 2014-09-12 MED ORDER — SODIUM CHLORIDE 0.9 % IV SOLN
INTRAVENOUS | Status: DC
  Administered 2014-09-12: 14:00:00 via INTRAVENOUS

## 2014-09-12 MED ORDER — SODIUM CHLORIDE 0.9 % IJ SOLN
10.0000 mL | Freq: Once | INTRAMUSCULAR | Status: AC
  Administered 2014-09-12: 10 mL via INTRAVENOUS

## 2014-09-12 MED ORDER — SODIUM CHLORIDE 0.9 % IV SOLN
125.0000 mg | Freq: Once | INTRAVENOUS | Status: AC
  Administered 2014-09-12: 125 mg via INTRAVENOUS
  Filled 2014-09-12: qty 10

## 2014-09-12 NOTE — Patient Instructions (Signed)
[place] at [facility] Discharge Instructions  RECOMMENDATIONS MADE BY THE CONSULTANT AND ANY TEST RESULTS WILL BE SENT TO YOUR REFERRING PHYSICIAN.  Today you received Ferric gluconate 125 mg iron infusion as ordered. Return as scheduled.  Thank you for choosing [place] at [facility] to provide your oncology and hematology care.  To afford each patient quality time with our provider, please arrive at least 15 minutes before your scheduled appointment time.    You need to re-schedule your appointment should you arrive 10 or more minutes late.  We strive to give you quality time with our providers, and arriving late affects you and other patients whose appointments are after yours.  Also, if you no show three or more times for appointments you may be dismissed from the clinic at the providers discretion.     Again, thank you for choosing [facility].  Our hope is that these requests will decrease the amount of time that you wait before being seen by our physicians.       _____________________________________________________________  Should you have questions after your visit to [facility], please contact our office at [phone] between the hours of 8:30 a.m. and 4:30 p.m.  Voicemails left after 4:30 p.m. will not be returned until the following business day.  For prescription refill requests, have your pharmacy contact our office.

## 2014-09-12 NOTE — Progress Notes (Signed)
Tolerated iron infusion well. 

## 2014-09-20 ENCOUNTER — Other Ambulatory Visit: Payer: Self-pay | Admitting: *Deleted

## 2014-09-20 ENCOUNTER — Other Ambulatory Visit (HOSPITAL_COMMUNITY): Payer: Self-pay | Admitting: Interventional Radiology

## 2014-09-20 DIAGNOSIS — R160 Hepatomegaly, not elsewhere classified: Secondary | ICD-10-CM

## 2014-09-20 DIAGNOSIS — K769 Liver disease, unspecified: Secondary | ICD-10-CM

## 2014-09-22 ENCOUNTER — Other Ambulatory Visit (HOSPITAL_COMMUNITY): Payer: Medicare Other

## 2014-09-29 ENCOUNTER — Encounter (HOSPITAL_COMMUNITY): Payer: Medicare Other

## 2014-10-02 DIAGNOSIS — R160 Hepatomegaly, not elsewhere classified: Secondary | ICD-10-CM | POA: Diagnosis not present

## 2014-10-02 DIAGNOSIS — E780 Pure hypercholesterolemia: Secondary | ICD-10-CM | POA: Diagnosis not present

## 2014-10-02 DIAGNOSIS — E039 Hypothyroidism, unspecified: Secondary | ICD-10-CM | POA: Diagnosis not present

## 2014-10-02 DIAGNOSIS — K210 Gastro-esophageal reflux disease with esophagitis: Secondary | ICD-10-CM | POA: Diagnosis not present

## 2014-10-02 DIAGNOSIS — I10 Essential (primary) hypertension: Secondary | ICD-10-CM | POA: Diagnosis not present

## 2014-10-02 DIAGNOSIS — E119 Type 2 diabetes mellitus without complications: Secondary | ICD-10-CM | POA: Diagnosis not present

## 2014-10-02 DIAGNOSIS — C220 Liver cell carcinoma: Secondary | ICD-10-CM | POA: Diagnosis not present

## 2014-10-09 DIAGNOSIS — M545 Low back pain: Secondary | ICD-10-CM | POA: Diagnosis not present

## 2014-10-09 DIAGNOSIS — Z8505 Personal history of malignant neoplasm of liver: Secondary | ICD-10-CM | POA: Diagnosis not present

## 2014-10-09 DIAGNOSIS — F411 Generalized anxiety disorder: Secondary | ICD-10-CM | POA: Diagnosis not present

## 2014-10-09 DIAGNOSIS — E780 Pure hypercholesterolemia: Secondary | ICD-10-CM | POA: Diagnosis not present

## 2014-10-09 DIAGNOSIS — M797 Fibromyalgia: Secondary | ICD-10-CM | POA: Diagnosis not present

## 2014-10-09 DIAGNOSIS — E119 Type 2 diabetes mellitus without complications: Secondary | ICD-10-CM | POA: Diagnosis not present

## 2014-10-09 DIAGNOSIS — I10 Essential (primary) hypertension: Secondary | ICD-10-CM | POA: Diagnosis not present

## 2014-10-09 DIAGNOSIS — E039 Hypothyroidism, unspecified: Secondary | ICD-10-CM | POA: Diagnosis not present

## 2014-10-12 ENCOUNTER — Ambulatory Visit
Admission: RE | Admit: 2014-10-12 | Discharge: 2014-10-12 | Disposition: A | Discharge status: Home / Self Care | Payer: Medicare Other | Source: Ambulatory Visit | Attending: Interventional Radiology | Admitting: Interventional Radiology

## 2014-10-12 DIAGNOSIS — K769 Liver disease, unspecified: Secondary | ICD-10-CM

## 2014-10-12 DIAGNOSIS — C787 Secondary malignant neoplasm of liver and intrahepatic bile duct: Secondary | ICD-10-CM | POA: Diagnosis not present

## 2014-10-12 NOTE — Progress Notes (Signed)
Referring Physician(s): McCullough,Heath  Chief Complaint: The patient is seen in follow up today s/p liver lesion microwave ablation 09/01/14  History of present illness:  81 year old female with a history of colon adenocarcinoma and a solitary enlarging lesion in the left liver most consistent with liver metastasis. 1. Successful ultrasound-guided fine-needle aspiration and core biopsy of left hepatic lesion. Quick stain cytology confirms malignancy. 2. Successful percutaneous thermal ablation of metastatic left hepatic lesion.  Pt has done well post procedure. Did suffer mild upper abdominal pain for a few days post procedure. Resolved on its own. Denies continued symptoms. Denies N/V/D; nor fever Erline Levine  Presents today for 5 week follow up Dr Laurence Ferrari has seen pt and reviewed imaging All questions answered to satisfaction of pt and family  Past Medical History  Diagnosis Date  . Chronic diastolic CHF (congestive heart failure)     a. nl EF by echo 05/2009.  Marland Kitchen DVT of deep femoral vein   . Palpitations   . History of PSVT (paroxysmal supraventricular tachycardia)   . Hypertension   . Abdominal adhesions   . Ileostomy present   . Colon polyp   . Chest pain     a. 2002 Cath: nl cors;  b. 2009 aden mv: nl;  c. 05/2009 Echo: nl.  . Wears dentures   . Breast lump   . Hyperlipidemia   . Thyroid disease   . Anemia   . History of blood clots   . Hernia   . Blood transfusion   . Hearing loss   . Arthritis   . GERD (gastroesophageal reflux disease)   . Cancer     bladder  . Colon cancer     x 2. Last 2010  . Iron deficiency anemia due to chronic blood loss 07/06/2013  . UTI (urinary tract infection)   . Obstruction of intestine or colon 06/2014  . Shortness of breath dyspnea     at times  . Diabetes mellitus without complication 5/95/63    borderline type II; takes no medication for it  . Vision abnormalities 2016    not going blind but having retina problems;  might lose ability to see faces  . LBBB (left bundle branch block)   . Anginal pain     occasionally  . Asthma     hx of  . Depression   . Chronic kidney disease     pt. states decreased kidney function  . Headache   . Fibromyalgia   . History of hiatal hernia     botox injection for achalasia    Past Surgical History  Procedure Laterality Date  . Cardiac catheterization  12/10/2000    THE LEFT VENTRICLE IS MILDY DILATED. THERE IS MILD TO MODERATE DIFFUSE HYPOKINESIS WITH EF 35%  . Colon cancer surgery    . Ileostomy    . Esophagogastroduodenoscopy  02/13/2011    Procedure: ESOPHAGOGASTRODUODENOSCOPY (EGD);  Surgeon: Rogene Houston, MD;  Location: [facility];  Service: Endoscopy;  Laterality: N/A;  1030  . Colonoscopy  09/26/2011    Procedure: COLONOSCOPY;  Surgeon: Rogene Houston, MD;  Location: [facility];  Service: Endoscopy;  Laterality: N/A;  215  . Cataract extraction w/phaco  11/13/2011    Procedure: CATARACT EXTRACTION PHACO AND INTRAOCULAR LENS PLACEMENT (IOC);  Surgeon: Tonny Branch, MD;  Location: [facility];  Service: Ophthalmology;  Laterality: Right;  CDE 12.26  . Cataract extraction w/phaco  12/08/2011    Procedure: CATARACT EXTRACTION PHACO AND INTRAOCULAR LENS  PLACEMENT (IOC);  Surgeon: Tonny Branch, MD;  Location: [facility];  Service: Ophthalmology;  Laterality: Left;  CDE 15.11  . Esophagogastroduodenoscopy N/A 09/01/2013    Procedure: ESOPHAGOGASTRODUODENOSCOPY (EGD);  Surgeon: Rogene Houston, MD;  Location: [facility];  Service: Endoscopy;  Laterality: N/A;  830  . Botox injection N/A 09/01/2013    Procedure: BOTOX INJECTION;  Surgeon: Rogene Houston, MD;  Location: [facility];  Service: Endoscopy;  Laterality: N/A;  . Abdominal hysterectomy    . Cholecystectomy    . Joint replacement Left 2008  . Tonsillectomy    . Colon surgery    . Eye surgery  2014    catarac surgery  . Appendectomy    . Ovarian cystectomy 1955    . Ileostomy    . Coronary  angioplasty    . Rotator cuff repair    . Breast surgery      right breast biopsy  . Liver biopsy and ablation  09/01/14    Allergies: Bactrim; Codeine; Demerol; Lidocaine hcl; Morphine and related; Nitrofurantoin monohyd macro; Lisinopril; and Ramipril  Medications: Prior to Admission medications   Medication Sig Start Date End Date Taking? Authorizing Provider  ALPRAZolam Duanne Moron) 0.5 MG tablet Take 0.5 mg by mouth at bedtime.     Historical Provider, MD  amitriptyline (ELAVIL) 10 MG tablet Take 20 mg by mouth at bedtime.     Historical Provider, MD  aspirin EC 325 MG tablet Take 325 mg by mouth daily.    Historical Provider, MD  carvedilol (COREG) 12.5 MG tablet Take 1 tablet (12.5 mg total) by mouth 2 (two) times daily with a meal. 12/02/13   Darlin Coco, MD  HYDROcodone-acetaminophen (NORCO/VICODIN) 5-325 MG per tablet Take 1 tablet by mouth every 4 (four) hours as needed for moderate pain.    Historical Provider, MD  hyoscyamine (LEVSIN SL) 0.125 MG SL tablet Place 1 tablet (0.125 mg total) under the tongue every 6 (six) hours as needed (lower abdominal pain). 09/11/14   Rogene Houston, MD  ketoconazole (NIZORAL) 2 % cream Apply 1 application topically 2 (two) times daily. Apply to feet twice daily 06/01/14   Historical Provider, MD  levothyroxine (SYNTHROID, LEVOTHROID) 75 MCG tablet Take 75 mcg by mouth daily.     Historical Provider, MD  losartan-hydrochlorothiazide (HYZAAR) 100-12.5 MG per tablet Take by mouth as directed. 1/2 TABLET DAILY. DO NOT TAKE IF YOUR TOP BLOOD PRESSURE NUMBER IS LESS THAN 110    Historical Provider, MD  Multiple Vitamins-Minerals (EQL GUMMY ADULT) CHEW Chew 2 tablets by mouth daily.     Historical Provider, MD  NITROSTAT 0.4 MG SL tablet DISSOLVE ONE TABLET UNDER THE TONGUE EVERY 5 MINUTES AS NEEDED FOR CHEST PAIN.  DO NOT EXCEED A TOTAL OF 3 DOSES IN 15 MINUTES 11/15/13   Darlin Coco, MD  Omega-3 Fatty Acids (FISH OIL) 1000 MG CAPS Take 1 capsule by  mouth daily.    Historical Provider, MD  ondansetron (ZOFRAN-ODT) 4 MG disintegrating tablet Take 1 tablet (4 mg total) by mouth every 8 (eight) hours as needed for nausea or vomiting. 07/11/13   Davonna Belling, MD  pantoprazole (PROTONIX) 40 MG tablet Take 40 mg by mouth 2 (two) times daily.    Historical Provider, MD  triamcinolone lotion (KENALOG) 0.1 % Apply 1 application topically every 8 (eight) hours. 06/28/14   Historical Provider, MD     Family History  Problem Relation Age of Onset  . Stroke    . Hypertension    .  Heart disease Mother   . Stroke Mother     deceased  . Cancer Mother     bladder  . Arthritis Mother   . Arthritis Father   . Heart disease Father     decesaed  . Cancer Father     leukemia  . Stroke Sister     alive/debilitated  . Hypertension Sister   . Other Sister     paralysis  . Heart disease Brother     bypass surgery  . Cancer Brother   . Arthritis Brother   . Stroke Sister     alive/debilitated  . Diabetes Sister   . Other Brother     stomach problems  . Other Brother     bladder    Social History   Social History  . Marital Status: Married    Spouse Name: N/A  . Number of Children: N/A  . Years of Education: N/A   Social History Main Topics  . Smoking status: Former Smoker    Quit date: 02/11/1963  . Smokeless tobacco: Never Used  . Alcohol Use: No  . Drug Use: No  . Sexual Activity: Not on file   Other Topics Concern  . Not on file   Social History Narrative     Vital Signs: BP 127/57 mmHg  Pulse 67  Temp(Src) 98.1 F (36.7 C) (Oral)  Resp 14  SpO2 98%  Physical Exam  Constitutional: She is oriented to person, place, and time. She appears well-developed and well-nourished.  Cardiovascular: Normal rate, regular rhythm and normal heart sounds.   Pulmonary/Chest: Effort normal and breath sounds normal. She has no wheezes.  Abdominal: Soft. Bowel sounds are normal. There is no tenderness.  Musculoskeletal: Normal  range of motion.  Neurological: She is alert and oriented to person, place, and time.  Skin: Skin is warm and dry.  Psychiatric: She has a normal mood and affect. Her behavior is normal. Judgment and thought content normal.  Nursing note and vitals reviewed.   Imaging: No results found.  Labs:  CBC:  Recent Labs  06/30/14 1053 08/30/14 0935 09/01/14 1840 09/02/14 0346  WBC 6.2 5.6 6.8 5.2  HGB 11.4* 11.0* 11.2* 10.8*  HCT 34.5* 33.6* 34.4* 33.4*  PLT 184 159 127* 149*    COAGS:  Recent Labs  08/30/14 0935  INR 1.12  APTT 34    BMP:  Recent Labs  06/16/14 0659 08/15/14 1356 08/30/14 0935 09/01/14 1840 09/02/14 0346  NA 142  --  141 141 139  K 3.6  --  5.0 4.3 4.0  CL 104  --  109 107 106  CO2 29  --  26 26 25   GLUCOSE 108*  --  120* 141* 160*  BUN 21* 43* 31* 31* 31*  CALCIUM 8.9  --  9.6 9.1 9.0  CREATININE 1.06* 1.50* 1.66* 1.30* 1.31*  GFRNONAA 48* 32* 28* 37* 37*  GFRAA 55* 37* 32* 43* 43*    LIVER FUNCTION TESTS:  Recent Labs  05/23/14 1000 06/15/14 0355 08/30/14 0935 09/01/14 1840 09/02/14 0346  BILITOT 0.5 0.7 0.5  --  0.9  AST 31 31 22   --  147*  ALT 22 27 18   --  95*  ALKPHOS 70 71 65  --  64  PROT 7.7 8.3* 7.3  --  6.8  ALBUMIN 3.9 4.3 3.8 3.6 3.5    Assessment:  Post liver lesion microwave ablation/biopsy 09/01/14 Hx  Colon Ca Liver lesion Bx + adenocarcinoma Doing well; no  complaints Will need MRI for evaluation of ablation in 1 mo  Signed: Dominique Calvey A 10/12/2014, 11:51 AM   Please refer to Dr. Laurence Ferrari attestation of this note for management and plan.

## 2014-10-19 ENCOUNTER — Encounter (HOSPITAL_COMMUNITY): Payer: Medicare Other | Attending: Hematology and Oncology | Admitting: Hematology & Oncology

## 2014-10-19 ENCOUNTER — Encounter (HOSPITAL_BASED_OUTPATIENT_CLINIC_OR_DEPARTMENT_OTHER): Payer: Medicare Other

## 2014-10-19 ENCOUNTER — Other Ambulatory Visit (HOSPITAL_COMMUNITY): Payer: Self-pay | Admitting: Hematology & Oncology

## 2014-10-19 ENCOUNTER — Encounter (HOSPITAL_COMMUNITY): Payer: Self-pay | Admitting: Hematology & Oncology

## 2014-10-19 VITALS — BP 116/55 | HR 58 | Temp 98.4°F | Resp 16 | Wt 144.6 lb

## 2014-10-19 DIAGNOSIS — D509 Iron deficiency anemia, unspecified: Secondary | ICD-10-CM | POA: Insufficient documentation

## 2014-10-19 DIAGNOSIS — C189 Malignant neoplasm of colon, unspecified: Secondary | ICD-10-CM | POA: Insufficient documentation

## 2014-10-19 DIAGNOSIS — Z1239 Encounter for other screening for malignant neoplasm of breast: Secondary | ICD-10-CM

## 2014-10-19 DIAGNOSIS — D500 Iron deficiency anemia secondary to blood loss (chronic): Secondary | ICD-10-CM

## 2014-10-19 LAB — COMPREHENSIVE METABOLIC PANEL
ALBUMIN: 3.5 g/dL (ref 3.5–5.0)
ALK PHOS: 58 U/L (ref 38–126)
ALT: 12 U/L — AB (ref 14–54)
AST: 18 U/L (ref 15–41)
Anion gap: 5 (ref 5–15)
BUN: 20 mg/dL (ref 6–20)
CO2: 28 mmol/L (ref 22–32)
CREATININE: 1.05 mg/dL — AB (ref 0.44–1.00)
Calcium: 8.8 mg/dL — ABNORMAL LOW (ref 8.9–10.3)
Chloride: 107 mmol/L (ref 101–111)
GFR calc Af Amer: 56 mL/min — ABNORMAL LOW (ref >60)
GFR calc non Af Amer: 48 mL/min — ABNORMAL LOW (ref >60)
Glucose, Bld: 77 mg/dL (ref 65–99)
Potassium: 3.4 mmol/L — ABNORMAL LOW (ref 3.5–5.1)
Sodium: 140 mmol/L (ref 135–145)
Total Bilirubin: 0.4 mg/dL (ref 0.3–1.2)
Total Protein: 6.8 g/dL (ref 6.5–8.1)

## 2014-10-19 LAB — CBC WITH DIFFERENTIAL/PLATELET
Basophils Absolute: 0 10*3/uL (ref 0.0–0.1)
Basophils Relative: 0 % (ref 0–1)
Eosinophils Absolute: 0.2 10*3/uL (ref 0.0–0.7)
Eosinophils Relative: 4 % (ref 0–5)
HCT: 33.9 % — ABNORMAL LOW (ref 36.0–46.0)
HEMOGLOBIN: 11.1 g/dL — AB (ref 12.0–15.0)
LYMPHS ABS: 1.1 10*3/uL (ref 0.7–4.0)
Lymphocytes Relative: 25 % (ref 12–46)
MCH: 29.8 pg (ref 26.0–34.0)
MCHC: 32.7 g/dL (ref 30.0–36.0)
MCV: 90.9 fL (ref 78.0–100.0)
Monocytes Absolute: 0.4 10*3/uL (ref 0.1–1.0)
Monocytes Relative: 10 % (ref 3–12)
NEUTROS PCT: 61 % (ref 43–77)
Neutro Abs: 2.7 10*3/uL (ref 1.7–7.7)
PLATELETS: 172 10*3/uL (ref 150–400)
RBC: 3.73 MIL/uL — AB (ref 3.87–5.11)
RDW: 13 % (ref 11.5–15.5)
WBC: 4.5 10*3/uL (ref 4.0–10.5)

## 2014-10-19 LAB — FERRITIN: Ferritin: 74 ng/mL (ref 11–307)

## 2014-10-19 MED ORDER — POTASSIUM CHLORIDE ER 10 MEQ PO CPCR
10.0000 meq | ORAL_CAPSULE | Freq: Two times a day (BID) | ORAL | Status: DC
Start: 2014-10-19 — End: 2015-01-11

## 2014-10-19 NOTE — Patient Instructions (Signed)
..  [place] at [facility] Discharge Instructions  RECOMMENDATIONS MADE BY THE CONSULTANT AND ANY TEST RESULTS WILL BE SENT TO YOUR REFERRING PHYSICIAN.  Labs today Return in 2 months MRI and mammogram to be scheduled   Thank you for choosing [place] at [facility] to provide your oncology and hematology care.  To afford each patient quality time with our provider, please arrive at least 15 minutes before your scheduled appointment time.    You need to re-schedule your appointment should you arrive 10 or more minutes late.  We strive to give you quality time with our providers, and arriving late affects you and other patients whose appointments are after yours.  Also, if you no show three or more times for appointments you may be dismissed from the clinic at the providers discretion.     Again, thank you for choosing [facility].  Our hope is that these requests will decrease the amount of time that you wait before being seen by our physicians.       _____________________________________________________________  Should you have questions after your visit to [facility], please contact our office at [phone] between the hours of 8:30 a.m. and 4:30 p.m.  Voicemails left after 4:30 p.m. will not be returned until the following business day.  For prescription refill requests, have your pharmacy contact our office.

## 2014-10-19 NOTE — Progress Notes (Signed)
Labs drawn

## 2014-10-19 NOTE — Progress Notes (Signed)
Karen Hilding, MD [place] / [place] [address]   DIAGNOSIS: Colon carcinoma with permanent ostomy Diversion Colitis Achalasia Iron deficiency, anemia secondary to GI related blood loss Hemeoccult positive stools Rising CEA with CT imaging on 07/13/2014 1.3 cm mass in L hepatic lobe Subcapsular mass in anterior pole of L kidney suspicious for RCC Cutaneous microwave ablation of her now biopsy-proven metastatic colonic adenocarcinoma to the left liver 09/01/2014  CURRENT THERAPY: IV iron, intolerance to oral iron and Observation  History of Present Illness: Karen [name] returns today for further follow-up of stage IV CRC s/p microwave ablation of a solitary liver lesion. She also has a L renal lesion that is under observation. She has other health issues including chronic LBP.  The patient is present today in a wheelchair with her daughter.    The patient will have an MRI of the liver in October.  She notes that she is having taste issues.  The only thing that she can lightly taste is vegetables. This is frustrating for her and her family.  She has complaints of a shooting pain in her right pelvic area that occurs when she stands up after sitting down and eating.  She has not had her mammogram yet this year because she was too sore from surgery.  She usually gets them done in [place].  She denies any major new complaints. She has ongoing chronic issues.  MEDICAL HISTORY: Past Medical History  Diagnosis Date  . Chronic diastolic CHF (congestive heart failure)     a. nl EF by echo 05/2009.  Marland Kitchen DVT of deep femoral vein   . Palpitations   . History of PSVT (paroxysmal supraventricular tachycardia)   . Hypertension   . Abdominal adhesions   . Ileostomy present   . Colon polyp   . Chest pain     a. 2002 Cath: nl cors;  b. 2009 aden mv: nl;  c. 05/2009 Echo: nl.  . Wears dentures   . Breast lump   . Hyperlipidemia   . Thyroid disease   . Anemia   . History of blood clots   .  Hernia   . Blood transfusion   . Hearing loss   . Arthritis   . GERD (gastroesophageal reflux disease)   . Cancer     bladder  . Colon cancer     x 2. Last 2010  . Iron deficiency anemia due to chronic blood loss 07/06/2013  . UTI (urinary tract infection)   . Obstruction of intestine or colon 06/2014  . Shortness of breath dyspnea     at times  . Diabetes mellitus without complication 2/70/62    borderline type II; takes no medication for it  . Vision abnormalities 2016    not going blind but having retina problems; might lose ability to see faces  . LBBB (left bundle branch block)   . Anginal pain     occasionally  . Asthma     hx of  . Depression   . Chronic kidney disease     pt. states decreased kidney function  . Headache   . Fibromyalgia   . History of hiatal hernia     botox injection for achalasia    has Left bundle branch block; Chest pain, exertional; Benign hypertensive heart disease without heart failure; Hypothyroid; Small bowel obstruction, partial; Dysphagia; Paroxysmal SVT (supraventricular tachycardia); Ileostomy status; Colon cancer; Unstable angina; Parastomal hernia; Iron deficiency anemia due to chronic blood loss; Malabsorption  of iron; Acute abdominal pain; Acute renal failure; Hyperglycemia; UTI (urinary tract infection); Small bowel obstruction; Abdominal pain, acute; Liver mass, left lobe; Colon cancer metastasized to liver; Liver lesion; and Sinus bradycardia on her problem list.     is allergic to bactrim; codeine; demerol; lidocaine hcl; morphine and related; nitrofurantoin monohyd macro; lisinopril; and ramipril.   Iron Infusion in March 2016. Mammogram performed in 2015. Reported multiple hernias.   SURGICAL HISTORY: Past Surgical History  Procedure Laterality Date  . Cardiac catheterization  12/10/2000    THE LEFT VENTRICLE IS MILDY DILATED. THERE IS MILD TO MODERATE DIFFUSE HYPOKINESIS WITH EF 35%  . Colon cancer surgery    . Ileostomy      . Esophagogastroduodenoscopy  02/13/2011    Procedure: ESOPHAGOGASTRODUODENOSCOPY (EGD);  Surgeon: Rogene Houston, MD;  [facility];  Service: Endoscopy;  Laterality: N/A;  1030  . Colonoscopy  09/26/2011    Procedure: COLONOSCOPY;  Surgeon: Rogene Houston, MD;  [facility]: [facility];  Service: Endoscopy;  Laterality: N/A;  215  . Cataract extraction w/phaco  11/13/2011    Procedure: CATARACT EXTRACTION PHACO AND INTRAOCULAR LENS PLACEMENT (IOC);  Surgeon: Tonny Branch, MD;  Location: [facility];  Service: Ophthalmology;  Laterality: Right;  CDE 12.26  . Cataract extraction w/phaco  12/08/2011    Procedure: CATARACT EXTRACTION PHACO AND INTRAOCULAR LENS PLACEMENT (IOC);  Surgeon: Tonny Branch, MD;  Location: [facility];  Service: Ophthalmology;  Laterality: Left;  CDE 15.11  . Esophagogastroduodenoscopy N/A 09/01/2013    Procedure: ESOPHAGOGASTRODUODENOSCOPY (EGD);  Surgeon: Rogene Houston, MD;  [facility];  Service: Endoscopy;  Laterality: N/A;  830  . Botox injection N/A 09/01/2013    Procedure: BOTOX INJECTION;  Surgeon: Rogene Houston, MD;  [facility]: [facility];  Service: Endoscopy;  Laterality: N/A;  . Abdominal hysterectomy    . Cholecystectomy    . Joint replacement Left 2008  . Tonsillectomy    . Colon surgery    . Eye surgery  2014    catarac surgery  . Appendectomy    . Ovarian cystectomy 1955    . Ileostomy    . Coronary angioplasty    . Rotator cuff repair    . Breast surgery      right breast biopsy  . Liver biopsy and ablation  09/01/14    SOCIAL HISTORY: Social History   Social History  . Marital Status: Married    Spouse Name: N/A  . Number of Children: N/A  . Years of Education: N/A   Occupational History  . Not on file.   Social History Main Topics  . Smoking status: Former Smoker    Quit date: 02/11/1963  . Smokeless tobacco: Never Used  . Alcohol Use: No  . Drug Use: No  . Sexual Activity: Not on file   Other Topics Concern  . Not  on file   Social History Narrative   Reads inspirational books   FAMILY HISTORY: Family History  Problem Relation Age of Onset  . Stroke    . Hypertension    . Heart disease Mother   . Stroke Mother     deceased  . Cancer Mother     bladder  . Arthritis Mother   . Arthritis Father   . Heart disease Father     decesaed  . Cancer Father     leukemia  . Stroke Sister     alive/debilitated  . Hypertension Sister   . Other  Sister     paralysis  . Heart disease Brother     bypass surgery  . Cancer Brother   . Arthritis Brother   . Stroke Sister     alive/debilitated  . Diabetes Sister   . Other Brother     stomach problems  . Other Brother     bladder    Review of Systems  Constitutional: Negative for fever, chills, weight loss and malaise/fatigue.  HENT: Positive for hearing loss. Negative for congestion, nosebleeds, sore throat and tinnitus.   Eyes: Negative for blurred vision, double vision, pain and discharge.  Respiratory: Negative for cough, hemoptysis, sputum production, shortness of breath and wheezing.   Cardiovascular: Negative for chest pain, palpitations, claudication, leg swelling and PND.  Gastrointestinal:  Negative for heartburn, abdominal pain, diarrhea, constipation, blood in stool and melena. ALTERED TASTE Genitourinary: Negative for dysuria, urgency, frequency and hematuria.  Musculoskeletal: Positive for joint pain. Negative for myalgias and falls.  Skin: Negative for itching and rash.  Neurological: Positive for weakness. Negative for dizziness, tingling, tremors, sensory change, speech change, focal weakness, seizures, loss of consciousness and headaches.  Endo/Heme/Allergies: Does not bruise/bleed easily.  Psychiatric/Behavioral: Negative for depression, suicidal ideas, memory loss and substance abuse. The patient is not nervous/anxious and does not have insomnia.   14 point review of systems was performed and is negative except as detailed  under history of present illness and above    PHYSICAL EXAMINATION  ECOG PERFORMANCE STATUS: 1 - Symptomatic but completely ambulatory  Filed Vitals:   10/19/14 1126  BP: 116/55  Pulse: 58  Temp: 98.4 F (36.9 C)  Resp: 16    Physical Exam  Constitutional: She is oriented to person, place, and time and well-developed, well-nourished, and in no distress. In a wheelchair.  HENT:  Head: Normocephalic and atraumatic.  Nose: Nose normal.  Mouth/Throat: Oropharynx is clear and moist. No oropharyngeal exudate.  Eyes: Conjunctivae and EOM are normal. Pupils are equal, round, and reactive to light. Right eye exhibits no discharge. Left eye exhibits no discharge. No scleral icterus.  Neck: Normal range of motion. Neck supple. No tracheal deviation present. No thyromegaly present.  Cardiovascular: Normal rate, regular rhythm and normal heart sounds.  Exam reveals no gallop and no friction rub.   No murmur heard. Pulmonary/Chest: Effort normal and breath sounds normal. She has no wheezes. She has no rales.  Abdominal: Soft. Bowel sounds are normal. She exhibits no distension and no mass. There is no tenderness. There is no rebound and no guarding.  Musculoskeletal: Normal range of motion. She exhibits no edema.  Lymphadenopathy:    She has no cervical adenopathy.  Neurological: She is alert and oriented to person, place, and time. She has normal reflexes. No cranial nerve deficit.  Skin: Skin is warm and dry. No rash noted.  Psychiatric: Mood, memory, affect and judgment normal.  Nursing note and vitals reviewed.    LABORATORY DATA:  CBC    Component Value Date/Time   WBC 4.5 10/19/2014 1045   RBC 3.73* 10/19/2014 1045   RBC 3.45* 11/24/2008 1500   HGB 11.1* 10/19/2014 1045   HCT 33.9* 10/19/2014 1045   PLT 172 10/19/2014 1045   MCV 90.9 10/19/2014 1045   MCH 29.8 10/19/2014 1045   MCHC 32.7 10/19/2014 1045   RDW 13.0 10/19/2014 1045   LYMPHSABS 1.1 10/19/2014 1045   MONOABS  0.4 10/19/2014 1045   EOSABS 0.2 10/19/2014 1045   BASOSABS 0.0 10/19/2014 1045   CMP  Component Value Date/Time   NA 140 10/19/2014 1045   K 3.4* 10/19/2014 1045   CL 107 10/19/2014 1045   CO2 28 10/19/2014 1045   GLUCOSE 77 10/19/2014 1045   BUN 20 10/19/2014 1045   CREATININE 1.05* 10/19/2014 1045   CREATININE 1.50* 08/15/2014 1356   CALCIUM 8.8* 10/19/2014 1045   PROT 6.8 10/19/2014 1045   ALBUMIN 3.5 10/19/2014 1045   AST 18 10/19/2014 1045   ALT 12* 10/19/2014 1045   ALKPHOS 58 10/19/2014 1045   BILITOT 0.4 10/19/2014 1045   GFRNONAA 48* 10/19/2014 1045   GFRNONAA 32* 08/15/2014 1356   GFRAA 56* 10/19/2014 1045   GFRAA 37* 08/15/2014 1356   RADIOLOGY: I have reviewed the images detailed below and agree with the results:   CLINICAL DATA: Elevated CEA levels. Colorectal carcinoma. Previous surgery. Evaluate for recurrent or metastatic disease.  EXAM: CT CHEST, ABDOMEN, AND PELVIS WITH CONTRAST  TECHNIQUE: Multidetector CT imaging of the chest, abdomen and pelvis was performed following the standard protocol during bolus administration of intravenous contrast.  CONTRAST: 24mL OMNIPAQUE IOHEXOL 300 MG/ML SOLN  COMPARISON: 01/04/2013  FINDINGS: CT CHEST FINDINGS  Mediastinum/Lymph Nodes: No masses or pathologically enlarged lymph nodes identified. Small less than 1 cm mediastinal lymph nodes in the right paratracheal region and right cardiophrenic angle are stable and not pathologically enlarged.  Lungs/Pleura: No pulmonary infiltrate or mass identified. No effusion present.  Musculoskeletal/Soft Tissues: No suspicious bone lesions or other significant chest wall abnormality.  CT ABDOMEN AND PELVIS FINDINGS  Hepatobiliary: Hepatic steatosis again noted. A new 1.3 cm hypovascular mass is seen in segment 2 of the left hepatic lobe, consistent with liver metastasis. No other liver masses are identified. A tiny sub-cm cyst in the caudate  lobe appears stable.  Pancreas: No mass, inflammatory changes, or other significant abnormality identified.  Spleen: Within normal limits in size and appearance.  Adrenals: No masses identified.  Kidneys/Urinary Tract: Small benign-appearing right renal cyst again noted. A subcapsular lesion in the anterior lower pole left kidney shows mild increase in size, now measuring 1.2 cm compared to 1.0 cm previously. This shows possible contrast enhancement with washout on delayed imaging, suspicious for a solid renal neoplasm. No evidence of hydronephrosis.  Stomach/Bowel/Peritoneum: No evidence of wall thickening, mass, or obstruction. Stable postoperative changes from right colectomy and right lower quadrant ileostomy. Sigmoid diverticulosis is noted, without evidence of diverticulitis.  Vascular/Lymphatic: No pathologically enlarged lymph nodes identified. No other significant abnormality visualized.  Reproductive: No mass or other significant abnormality identified.  Other: Left hip prosthesis causes significant beam hardening artifact obscuring the inferior pelvis.  Musculoskeletal: No suspicious bone lesions identified.  IMPRESSION: New 1.3 cm hypovascular mass in segment 2 of the left hepatic lobe, consistent with liver metastasis.  No other sites of metastatic disease identified within the chest, abdomen, or pelvis.  Further mild enlargement of 1.2 cm subcapsular mass in the anterior lower pole of left kidney, suspicious for a solid renal neoplasm/renal cell carcinoma. Recommend abdomen MRI without and with contrast for further characterization. This recommendation follows ACR consensus guidelines: Managing Incidental Findings on Abdominal CT: White Paper of the ACR Incidental Findings Committee. J Am Coll Radiol [id number]   Electronically Signed  By: Earle Gell M.D.     ASSESSMENT and THERAPY PLAN:  Colon carcinoma with permanent  ostomy Diversion Colitis Achalasia Iron deficiency, anemia secondary to GI related blood loss Hemeoccult positive stools Rising CEA with CT imaging on 07/13/2014 1.3 cm mass in L hepatic  lobe Subcapsular mass in anterior pole of L kidney suspicious for RCC Biopsy of L liver lesion and MWA Left liver lesion 09/01/2014 Taste Alteration   Realistically she is at her baseline. She is due for mammography and F/U MRI of the liver. I have scheduled her MRI for the end of October per radiology recommendations.  We will recheck a CEA at her next follow-up appointment. Ferritin is pending today and if she needs additional iron we will advise her.   She also has seen urology and ongoing observation of the kidney lesion is recommended.  She continues to complain of multiple other nonspecific issues, she seems to understand that some of these are just unfortunately age related.  I have recommended she try zinc 50 mg tid for her taste. She is to increase her calcium intake.   All questions were answered. The patient knows to call the clinic with any problems, questions or concerns. We can certainly see the patient much sooner if necessary.   This document was electronically signed.   This document serves as a record of services personally performed by Ancil Linsey, MD. It was created on her behalf by Janace Hoard, a trained medical scribe. The creation of this record is based on the scribe's personal observations and the provider's statements to them. This document has been checked and approved by the attending provider.  I have reviewed the above documentation for accuracy and completeness, and I agree with the above.  This note was electronically signed.    Kelby Fam. Whitney Muse, MD

## 2014-10-20 ENCOUNTER — Encounter (HOSPITAL_COMMUNITY): Payer: Self-pay | Admitting: Hematology & Oncology

## 2014-10-20 LAB — CEA: CEA: 25.3 ng/mL — AB (ref 0.0–4.7)

## 2014-10-25 ENCOUNTER — Encounter (HOSPITAL_BASED_OUTPATIENT_CLINIC_OR_DEPARTMENT_OTHER): Payer: Medicare Other

## 2014-10-25 ENCOUNTER — Encounter (HOSPITAL_COMMUNITY): Payer: Self-pay

## 2014-10-25 ENCOUNTER — Other Ambulatory Visit (HOSPITAL_COMMUNITY): Payer: Self-pay | Admitting: Hematology & Oncology

## 2014-10-25 ENCOUNTER — Ambulatory Visit (HOSPITAL_COMMUNITY)
Admission: RE | Admit: 2014-10-25 | Discharge: 2014-10-25 | Disposition: A | Discharge status: Home / Self Care | Payer: Medicare Other | Source: Ambulatory Visit | Attending: Hematology & Oncology | Admitting: Hematology & Oncology

## 2014-10-25 DIAGNOSIS — Z1231 Encounter for screening mammogram for malignant neoplasm of breast: Secondary | ICD-10-CM | POA: Insufficient documentation

## 2014-10-25 DIAGNOSIS — D500 Iron deficiency anemia secondary to blood loss (chronic): Secondary | ICD-10-CM | POA: Diagnosis present

## 2014-10-25 MED ORDER — SODIUM CHLORIDE 0.9 % IV SOLN
125.0000 mg | Freq: Once | INTRAVENOUS | Status: AC
  Administered 2014-10-25: 125 mg via INTRAVENOUS
  Filled 2014-10-25: qty 10

## 2014-10-25 MED ORDER — SODIUM CHLORIDE 0.9 % IV SOLN
INTRAVENOUS | Status: DC
  Administered 2014-10-25: 13:00:00 via INTRAVENOUS

## 2014-10-25 NOTE — Progress Notes (Signed)
1425:  Tolerated infusion w/o adverse reaction.  A&Ox4, in no distress.  VSS.  Discharged via wheelchair in c/o daughter.

## 2014-10-25 NOTE — Patient Instructions (Signed)
[place] at [facility] Discharge Instructions  RECOMMENDATIONS MADE BY THE CONSULTANT AND ANY TEST RESULTS WILL BE SENT TO YOUR REFERRING PHYSICIAN.  Iron infusion today. Return as scheduled for office visit.   Thank you for choosing [place] at [facility] to provide your oncology and hematology care.  To afford each patient quality time with our provider, please arrive at least 15 minutes before your scheduled appointment time.    You need to re-schedule your appointment should you arrive 10 or more minutes late.  We strive to give you quality time with our providers, and arriving late affects you and other patients whose appointments are after yours.  Also, if you no show three or more times for appointments you may be dismissed from the clinic at the providers discretion.     Again, thank you for choosing [facility].  Our hope is that these requests will decrease the amount of time that you wait before being seen by our physicians.       _____________________________________________________________  Should you have questions after your visit to [facility], please contact our office at [phone] between the hours of 8:30 a.m. and 4:30 p.m.  Voicemails left after 4:30 p.m. will not be returned until the following business day.  For prescription refill requests, have your pharmacy contact our office.

## 2014-11-15 ENCOUNTER — Ambulatory Visit: Payer: Medicare Other | Admitting: Cardiology

## 2014-11-23 ENCOUNTER — Ambulatory Visit: Payer: Medicare Other | Admitting: Cardiology

## 2014-11-27 DIAGNOSIS — Z23 Encounter for immunization: Secondary | ICD-10-CM | POA: Diagnosis not present

## 2014-12-06 ENCOUNTER — Encounter: Payer: Self-pay | Admitting: Physician Assistant

## 2014-12-06 ENCOUNTER — Ambulatory Visit (INDEPENDENT_AMBULATORY_CARE_PROVIDER_SITE_OTHER): Payer: Medicare Other | Admitting: Physician Assistant

## 2014-12-06 ENCOUNTER — Ambulatory Visit: Payer: Medicare Other | Admitting: Cardiology

## 2014-12-06 VITALS — BP 120/62 | HR 78 | Ht 60.0 in | Wt 145.6 lb

## 2014-12-06 DIAGNOSIS — I10 Essential (primary) hypertension: Secondary | ICD-10-CM | POA: Diagnosis not present

## 2014-12-06 DIAGNOSIS — I447 Left bundle-branch block, unspecified: Secondary | ICD-10-CM | POA: Diagnosis not present

## 2014-12-06 DIAGNOSIS — I471 Supraventricular tachycardia, unspecified: Secondary | ICD-10-CM

## 2014-12-06 DIAGNOSIS — I5032 Chronic diastolic (congestive) heart failure: Secondary | ICD-10-CM | POA: Diagnosis not present

## 2014-12-06 DIAGNOSIS — R001 Bradycardia, unspecified: Secondary | ICD-10-CM

## 2014-12-06 DIAGNOSIS — R002 Palpitations: Secondary | ICD-10-CM

## 2014-12-06 DIAGNOSIS — D509 Iron deficiency anemia, unspecified: Secondary | ICD-10-CM

## 2014-12-06 DIAGNOSIS — C189 Malignant neoplasm of colon, unspecified: Secondary | ICD-10-CM

## 2014-12-06 LAB — BASIC METABOLIC PANEL
BUN: 25 mg/dL (ref 7–25)
CALCIUM: 9.3 mg/dL (ref 8.6–10.4)
CO2: 29 mmol/L (ref 20–31)
CREATININE: 1.01 mg/dL — AB (ref 0.60–0.88)
Chloride: 104 mmol/L (ref 98–110)
GLUCOSE: 108 mg/dL — AB (ref 65–99)
Potassium: 4.1 mmol/L (ref 3.5–5.3)
Sodium: 142 mmol/L (ref 135–146)

## 2014-12-06 LAB — MAGNESIUM: MAGNESIUM: 2 mg/dL (ref 1.5–2.5)

## 2014-12-06 MED ORDER — CARVEDILOL 12.5 MG PO TABS: 12.5000 mg | ORAL_TABLET | Freq: Two times a day (BID) | ORAL | Status: DC

## 2014-12-06 NOTE — Progress Notes (Signed)
Cardiology Office Note Date:  12/06/2014  Patient ID:  Karen [name], DOB 11-25-1929, MRN [id number] PCP:  [name], MD  Cardiologist:  Mare Ferrari  Chief Complaint: f/u BP, HR  History of Present Illness: Karen [name] is a 81 y.o. female with history of PSVT, chronic intermittent chest pain (normal cath 2002, normal nuc 2014), colon cancer with metastasis to liver, HLD, thyroid disease, iron deficiency anemia, achalasia s/p Botox, remote intra-abdominal infection requiring ileostomy, LBBB, sinus bradycardia who presents for 3 month follow-up. Per notes, her colon cancer has recurred and on 09/01/14 she underwent percutaneous liver biopsy and thermal ablation of a liver metastasis by interventional radiology.She was observed overnight and found to have a heart rate in the 30s. Digoxin was discontinued. When seen in the office in July by Dr.Brackbill she was also having low blood pressure so losartan-HCT was reduced. Last echo in the computer in 2010 shows EF 40-45% but there are notes in the computer indicating she had normal echo in 2011 - chronic diastolic CHF is listed in her PMH but it does not appear she has had issues with this.  She comes in to clinic today for follow-up. She has been following her BP and HR at home and reports these have remained in a good range. She reports recurrence of occasional palpitations that usually last only a few seconds. These are described as a brief fast HR. She had an episode a few weeks ago that lasted longer than usual. She did not seek care at that time. She does not have any other symptoms with the palpitations. She continues to experience chronic intermittent chest discomfort as she's had for over a decade which she reports is unchanged. She gets occasional SOB with exertion but it is not consistent - at other times she is completely fine when she exerts herself. She has not had any dizziness or syncope. She says she previously took potassium per her  oncologist but wasn't sure if this was supposed to be a chronic medicine so she did not refill it.  Past Medical History  Diagnosis Date  . Chronic diastolic CHF (congestive heart failure) (HCC)     a. nl EF by echo 05/2009.  Marland Kitchen DVT of deep femoral vein ([place])   . History of PSVT (paroxysmal supraventricular tachycardia)   . Hypertension   . Abdominal adhesions   . Ileostomy present ([place])   . Colon polyp   . Chest pain     a. 2002 Cath: nl cors;  b. 2009 aden mv: nl;  c. 05/2009 Echo: nl. d. 2014: normal nuclear stress test, EF 69%.  . Wears dentures   . Breast lump   . Hyperlipidemia   . Thyroid disease   . Anemia   . History of blood clots   . Hernia   . Blood transfusion   . Hearing loss   . Arthritis   . GERD (gastroesophageal reflux disease)   . Cancer ([place])     bladder  . Colon cancer ([place])     a. recurrence 2016 with liver mets -  percutaneous liver biopsy and thermal ablation of a liver metastasis by interventional radiology..  . Iron deficiency anemia due to chronic blood loss 07/06/2013  . UTI (urinary tract infection)   . Obstruction of intestine or colon ([place]) 06/2014  . Diabetes mellitus without complication ([place]) 9/39/03    borderline type II; takes no medication for it  . Vision abnormalities 2016    not going blind  but having retina problems; might lose ability to see faces  . LBBB (left bundle branch block)   . Asthma     hx of  . Depression   . CKD (chronic kidney disease), stage III     pt. states decreased kidney function  . Headache   . Fibromyalgia   . Achalasia     a. s/p botox injection for achalasia.  . Sinus bradycardia     a. HR 30s in 08/2014 requiring holding of digoxin.    Past Surgical History  Procedure Laterality Date  . Cardiac catheterization  12/10/2000    THE LEFT VENTRICLE IS MILDY DILATED. THERE IS MILD TO MODERATE DIFFUSE HYPOKINESIS WITH EF 35%  . Colon cancer surgery    . Ileostomy    . Esophagogastroduodenoscopy  02/13/2011     Procedure: ESOPHAGOGASTRODUODENOSCOPY (EGD);  Surgeon: Rogene Houston, MD;  Location: [facility];  Service: Endoscopy;  Laterality: N/A;  1030  . Colonoscopy  09/26/2011    Procedure: COLONOSCOPY;  Surgeon: Rogene Houston, MD;  Location: [facility];  Service: Endoscopy;  Laterality: N/A;  215  . Cataract extraction w/phaco  11/13/2011    Procedure: CATARACT EXTRACTION PHACO AND INTRAOCULAR LENS PLACEMENT (IOC);  Surgeon: Tonny Branch, MD;  Location: [facility];  Service: Ophthalmology;  Laterality: Right;  CDE 12.26  . Cataract extraction w/phaco  12/08/2011    Procedure: CATARACT EXTRACTION PHACO AND INTRAOCULAR LENS PLACEMENT (IOC);  Surgeon: Tonny Branch, MD;  Location: [facility];  Service: Ophthalmology;  Laterality: Left;  CDE 15.11  . Esophagogastroduodenoscopy N/A 09/01/2013    Procedure: ESOPHAGOGASTRODUODENOSCOPY (EGD);  Surgeon: Rogene Houston, MD;  [facility]: [facility];  Service: Endoscopy;  Laterality: N/A;  830  . Botox injection N/A 09/01/2013    Procedure: BOTOX INJECTION;  Surgeon: Rogene Houston, MD;  Location: [facility];  Service: Endoscopy;  Laterality: N/A;  . Abdominal hysterectomy    . Cholecystectomy    . Joint replacement Left 2008  . Tonsillectomy    . Colon surgery    . Eye surgery  2014    catarac surgery  . Appendectomy    . Ovarian cystectomy 1955    . Ileostomy    . Coronary angioplasty    . Rotator cuff repair    . Breast surgery      right breast biopsy  . Liver biopsy and ablation  09/01/14    Current Outpatient Prescriptions  Medication Sig Dispense Refill  . ALPRAZolam (XANAX) 0.5 MG tablet Take 0.5 mg by mouth at bedtime.     Marland Kitchen amitriptyline (ELAVIL) 10 MG tablet Take 20 mg by mouth at bedtime.     Marland Kitchen aspirin EC 325 MG tablet Take 325 mg by mouth daily.    . carvedilol (COREG) 12.5 MG tablet Take 1 tablet (12.5 mg total) by mouth 2 (two) times daily with a meal. 180 tablet 3  . HYDROcodone-acetaminophen (NORCO/VICODIN) 5-325 MG per tablet Take 1  tablet by mouth every 4 (four) hours as needed for moderate pain.    . hyoscyamine (LEVSIN SL) 0.125 MG SL tablet Place 1 tablet (0.125 mg total) under the tongue every 6 (six) hours as needed (lower abdominal pain). 90 tablet 2  . levothyroxine (SYNTHROID, LEVOTHROID) 75 MCG tablet Take 75 mcg by mouth daily.     Marland Kitchen losartan-hydrochlorothiazide (HYZAAR) 100-12.5 MG per tablet Take by mouth as directed. 1/2 TABLET DAILY. DO NOT TAKE IF YOUR TOP BLOOD PRESSURE NUMBER IS LESS THAN  110    . Multiple Vitamins-Minerals (EQL GUMMY ADULT) CHEW Chew 2 tablets by mouth daily.     Marland Kitchen NITROSTAT 0.4 MG SL tablet DISSOLVE ONE TABLET UNDER THE TONGUE EVERY 5 MINUTES AS NEEDED FOR CHEST PAIN.  DO NOT EXCEED A TOTAL OF 3 DOSES IN 15 MINUTES 25 tablet 0  . Omega-3 Fatty Acids (FISH OIL) 1000 MG CAPS Take 1 capsule by mouth daily.    . ondansetron (ZOFRAN-ODT) 4 MG disintegrating tablet Take 1 tablet (4 mg total) by mouth every 8 (eight) hours as needed for nausea or vomiting. 20 tablet 0  . pantoprazole (PROTONIX) 40 MG tablet Take 40 mg by mouth 2 (two) times daily.    . potassium chloride (MICRO-K) 10 MEQ CR capsule Take 1 capsule (10 mEq total) by mouth 2 (two) times daily. 30 capsule 1   No current facility-administered medications for this visit.    Allergies:   Bactrim; Codeine; Demerol; Lidocaine hcl; Morphine and related; Nitrofurantoin monohyd macro; Lisinopril; and Ramipril   Social History:  The patient  reports that she quit smoking about 51 years ago. She has never used smokeless tobacco. She reports that she does not drink alcohol or use illicit drugs.   Family History:  The patient's family history includes Arthritis in her brother, father, and mother; Cancer in her brother, father, and mother; Diabetes in her sister; Heart disease in her brother, father, and mother; Hypertension in her sister and another family member; Other in her brother, brother, and sister; Stroke in her mother, sister, sister,  and another family member.  ROS:  Please see the history of present illness.  All other systems are reviewed and otherwise negative.   PHYSICAL EXAM:  VS:  BP 120/62 mmHg  Pulse 78  Ht 5' (1.524 m)  Wt 145 lb 9.6 oz (66.044 kg)  BMI 28.44 kg/m2  SpO2 97% BMI: Body mass index is 28.44 kg/(m^2). Well nourished, well developed WF, in no acute distress HEENT: normocephalic, atraumatic; strabismus noted Neck: no JVD, carotid bruits or masses Cardiac:  normal S1, S2; RRR; no murmurs, rubs, or gallops Lungs:  clear to auscultation bilaterally, no wheezing, rhonchi or rales Abd: soft, nontender, no hepatomegaly, + BS MS: no deformity or atrophy Ext: no edema Skin: warm and dry, no rash Neuro:  moves all extremities spontaneously, no focal abnormalities noted, follows commands Psych: euthymic mood, full affect   EKG:  Done today shows NSR 78bpm with chronic LBBB  Recent Labs: 06/15/2014: TSH 3.572 09/01/2014: Magnesium 1.6* 10/19/2014: ALT 12*; BUN 20; Creatinine, Ser 1.05*; Hemoglobin 11.1*; Platelets 172; Potassium 3.4*; Sodium 140  05/23/2014: Cholesterol 180; HDL 45; LDL Cholesterol 94; Total CHOL/HDL Ratio 4.0; Triglycerides 207*; VLDL 41*   CrCl cannot be calculated (Patient has no serum creatinine result on file.).   Wt Readings from Last 3 Encounters:  12/06/14 145 lb 9.6 oz (66.044 kg)  10/19/14 144 lb 9.6 oz (65.59 kg)  09/11/14 140 lb 8 oz (63.73 kg)     Other studies reviewed: Additional studies/records reviewed today include: summarized above  ASSESSMENT AND PLAN:  1. Palpitations - difficult to know if she is having recurrence of PSVT, ectopy, or a different arrhythmia. I offered the patient event monitoring but she declined. She would like to continue to observe symptoms for now rather than making any changes. If we do elect to increase her BB in the future, we have to keep in mind her prior h/o bradycardia. Will check repeat labs today including BMET,  Mg, TSH given h/o  low K and low Mg. If she requires KCl supplementation she prefers the smaller dose size. I asked her to let us know if she changes her mind about the event monitor. 2. Essential HTN - hypotension resolved. 3. Chronic chest pain/dyspnea - she reports this is unchanged and chronic for a long time. Prior ischemic evals have been unremarkable. In light of comorbidities, continued observation for escalation of symptoms is reasonable. 4. Sinus bradycardia - resolved following discontinuation of digoxin. Her history of bradycardia will need to be considered if we ever have to increase BB for her palpitations. 5. LBBB - chronic. 6. Chronic diastolic CHF - listed in her chart, although it does not look like she's had clinical evidence of this. Continue observation. 7. Colon cancer metastasized to liver, with chronic iron deficiency anemia - per oncology.  Disposition: F/u with Dr. Martinique in 6 months. (Dr. Mare Ferrari is retiring, and the patient prefers to f/u with Dr. Martinique as he previously followed her husband.)  Current medicines are reviewed at length with the patient today.  The patient did not have any concerns regarding medicines.  Raechel Ache PA-C 12/06/2014 2:22 PM     [place] [address] [address] [phone] (office)  [phone] (fax)

## 2014-12-06 NOTE — Patient Instructions (Signed)
Medication Instructions:  None  Labwork: BMET, Magnesium, and TSH today  Testing/Procedures: None  Follow-Up: Your physician wants you to follow-up in: 6 months with Dr. Martinique. You will receive a reminder letter in the mail two months in advance. If you don't receive a letter, please call our office to schedule the follow-up appointment.   Any Other Special Instructions Will Be Listed Below (If Applicable).     If you need a refill on your cardiac medications before your next appointment, please call your pharmacy.

## 2014-12-07 ENCOUNTER — Other Ambulatory Visit (HOSPITAL_COMMUNITY): Payer: Self-pay | Admitting: *Deleted

## 2014-12-07 ENCOUNTER — Ambulatory Visit (HOSPITAL_COMMUNITY)
Admission: RE | Admit: 2014-12-07 | Discharge: 2014-12-07 | Disposition: A | Discharge status: Home / Self Care | Payer: Medicare Other | Source: Ambulatory Visit | Attending: Hematology & Oncology | Admitting: Hematology & Oncology

## 2014-12-07 ENCOUNTER — Other Ambulatory Visit: Payer: Self-pay | Admitting: *Deleted

## 2014-12-07 ENCOUNTER — Telehealth (HOSPITAL_COMMUNITY): Payer: Self-pay | Admitting: *Deleted

## 2014-12-07 DIAGNOSIS — C189 Malignant neoplasm of colon, unspecified: Secondary | ICD-10-CM

## 2014-12-07 DIAGNOSIS — R160 Hepatomegaly, not elsewhere classified: Secondary | ICD-10-CM

## 2014-12-07 DIAGNOSIS — C799 Secondary malignant neoplasm of unspecified site: Secondary | ICD-10-CM

## 2014-12-07 LAB — TSH: TSH: 1.016 u[IU]/mL (ref 0.350–4.500)

## 2014-12-07 MED ORDER — SODIUM CHLORIDE 0.9 % IJ SOLN
INTRAMUSCULAR | Status: AC
  Filled 2014-12-07: qty 30

## 2014-12-07 MED ORDER — SODIUM CHLORIDE 0.9 % IJ SOLN
INTRAMUSCULAR | Status: AC
  Filled 2014-12-07: qty 15

## 2014-12-07 MED ORDER — SODIUM CHLORIDE 0.9 % IV SOLN
INTRAVENOUS | Status: AC
  Filled 2014-12-07: qty 250

## 2014-12-07 MED ORDER — ALPRAZOLAM 0.5 MG PO TABS: ORAL_TABLET | ORAL | Status: DC

## 2014-12-07 NOTE — Telephone Encounter (Signed)
Ok.  Please schedule accordingly.  Also, please call in Xanax 0.5 mg tablets # 5 and she can take 1 tablet 1 hour prior and can repeat 1 tablet in the waiting room prior to the MRI if needed.  She will need a ride to and from MRI.    Camerin Ladouceur, PA-C 3:57 PM 12/07/2014

## 2014-12-07 NOTE — Telephone Encounter (Signed)
Patient was scheduled for MRI abdomen--Mike from [facility] called to say patient will not do test today. States she requests to be rescheduled to a open MRI and that she requires premed.

## 2014-12-07 NOTE — Telephone Encounter (Signed)
She will need MRI order entered since they had released today in radiology

## 2014-12-08 ENCOUNTER — Other Ambulatory Visit (HOSPITAL_COMMUNITY): Payer: Self-pay | Admitting: Oncology

## 2014-12-08 ENCOUNTER — Other Ambulatory Visit (HOSPITAL_COMMUNITY): Payer: Self-pay | Admitting: Interventional Radiology

## 2014-12-08 ENCOUNTER — Ambulatory Visit
Admission: RE | Admit: 2014-12-08 | Discharge: 2014-12-08 | Disposition: A | Discharge status: Home / Self Care | Payer: Medicare Other | Source: Ambulatory Visit | Attending: Hematology & Oncology | Admitting: Hematology & Oncology

## 2014-12-08 DIAGNOSIS — C189 Malignant neoplasm of colon, unspecified: Secondary | ICD-10-CM

## 2014-12-08 DIAGNOSIS — C787 Secondary malignant neoplasm of liver and intrahepatic bile duct: Principal | ICD-10-CM

## 2014-12-08 DIAGNOSIS — N281 Cyst of kidney, acquired: Secondary | ICD-10-CM | POA: Diagnosis not present

## 2014-12-08 DIAGNOSIS — K769 Liver disease, unspecified: Secondary | ICD-10-CM | POA: Diagnosis not present

## 2014-12-08 DIAGNOSIS — C801 Malignant (primary) neoplasm, unspecified: Secondary | ICD-10-CM | POA: Diagnosis not present

## 2014-12-08 DIAGNOSIS — C799 Secondary malignant neoplasm of unspecified site: Secondary | ICD-10-CM | POA: Diagnosis not present

## 2014-12-08 DIAGNOSIS — R160 Hepatomegaly, not elsewhere classified: Secondary | ICD-10-CM | POA: Diagnosis not present

## 2014-12-08 MED ORDER — GADOBENATE DIMEGLUMINE 529 MG/ML IV SOLN
13.0000 mL | Freq: Once | INTRAVENOUS | Status: AC | PRN
  Administered 2014-12-08: 13 mL via INTRAVENOUS

## 2014-12-08 NOTE — Telephone Encounter (Signed)
Order is placed for MRI abd w and wo contrast.  Open MRI requested.  Please reschedule follow-up appointment with [place] if necessary.  KEFALAS,THOMAS, PA-C  12/08/2014 7:53 AM

## 2014-12-09 LAB — COMPLETE METABOLIC PANEL WITH GFR
ALT: 8 U/L (ref 6–29)
AST: 17 U/L (ref 10–35)
Albumin: 3.8 g/dL (ref 3.6–5.1)
Alkaline Phosphatase: 63 U/L (ref 33–130)
BILIRUBIN TOTAL: 0.5 mg/dL (ref 0.2–1.2)
BUN: 17 mg/dL (ref 7–25)
CHLORIDE: 103 mmol/L (ref 98–110)
CO2: 27 mmol/L (ref 20–31)
Calcium: 9.3 mg/dL (ref 8.6–10.4)
Creat: 0.95 mg/dL — ABNORMAL HIGH (ref 0.60–0.88)
GFR, EST NON AFRICAN AMERICAN: 56 mL/min — AB (ref >=60)
GFR, Est African American: 64 mL/min (ref >=60)
GLUCOSE: 108 mg/dL — AB (ref 65–99)
Potassium: 4 mmol/L (ref 3.5–5.3)
SODIUM: 141 mmol/L (ref 135–146)
TOTAL PROTEIN: 6.6 g/dL (ref 6.1–8.1)

## 2014-12-09 LAB — CBC
HEMATOCRIT: 34.7 % — AB (ref 36.0–46.0)
HEMOGLOBIN: 11.2 g/dL — AB (ref 12.0–15.0)
MCH: 28.5 pg (ref 26.0–34.0)
MCHC: 32.3 g/dL (ref 30.0–36.0)
MCV: 88.3 fL (ref 78.0–100.0)
MPV: 9.2 fL (ref 8.6–12.4)
PLATELETS: 198 10*3/uL (ref 150–400)
RBC: 3.93 MIL/uL (ref 3.87–5.11)
RDW: 12.9 % (ref 11.5–15.5)
WBC: 4.4 10*3/uL (ref 4.0–10.5)

## 2014-12-09 LAB — PROTIME-INR
INR: 1.04 (ref <1.50)
Prothrombin Time: 13.7 seconds (ref 11.6–15.2)

## 2014-12-09 LAB — CEA: CEA: 12 ng/mL — AB (ref 0.0–5.0)

## 2014-12-12 ENCOUNTER — Ambulatory Visit (HOSPITAL_COMMUNITY): Payer: Medicare Other | Admitting: Oncology

## 2014-12-13 NOTE — Addendum Note (Signed)
Addended by: Freada Bergeron on: 12/13/2014 05:46 PM   Modules accepted: Orders

## 2014-12-14 ENCOUNTER — Ambulatory Visit (HOSPITAL_COMMUNITY): Payer: Medicare Other | Admitting: Hematology & Oncology

## 2014-12-19 ENCOUNTER — Ambulatory Visit
Admission: RE | Admit: 2014-12-19 | Discharge: 2014-12-19 | Disposition: A | Discharge status: Home / Self Care | Payer: Medicare Other | Source: Ambulatory Visit | Attending: Interventional Radiology | Admitting: Interventional Radiology

## 2014-12-19 DIAGNOSIS — C189 Malignant neoplasm of colon, unspecified: Secondary | ICD-10-CM | POA: Diagnosis not present

## 2014-12-19 DIAGNOSIS — C787 Secondary malignant neoplasm of liver and intrahepatic bile duct: Secondary | ICD-10-CM | POA: Diagnosis not present

## 2014-12-19 NOTE — Progress Notes (Signed)
Chief Complaint: Patient was seen in follow-up today for  Chief Complaint  Patient presents with  . Follow-up    3 mo follow up [place] [facility]   at the request of Ancil Linsey  History of Present Illness: Karen [name] is a 81 y.o. female with a history of colon cancer both remotely in the 1980s, and 6 years ago in 2010. She underwent primary resection and now has a permanent colostomy. She has had no prior chemotherapy or radiation therapy. Additionally, she has a history of bladder cancer and underwent a surgery which she says was curative.   In the setting of a rising CEA a CT scan abdomen and pelvis was performed which demonstrated a new liver lesion. This was biopsied and shown to be positive for metastatic adenocarcinoma. The lesion was then ablated in the same setting on 09/01/2014. She presents today for her 3 month follow-up and imaging evaluation.  Her husband and daughter are with her. On review of systems she complains of persistent right hip and right abdominal pain. Her abdominal pain is intermittent and occasionally severe. Additionally, she has left lower quadrant pain which she describes as being cut with a knife. This tends to occur after eating. Furthermore, she occasionally experiences bloody stools both from her ostomy, and from her residual rectum. She states in the past that this has been diagnosed as conversion colitis.   She denies chest pain, shortness of breath or active systemic symptoms.   Past Medical History  Diagnosis Date  . Chronic diastolic CHF (congestive heart failure) (HCC)     a. nl EF by echo 05/2009.  Marland Kitchen DVT of deep femoral vein ([place])   . History of PSVT (paroxysmal supraventricular tachycardia)   . Hypertension   . Abdominal adhesions   . Ileostomy present ([place])   . Colon polyp   . Chest pain     a. 2002 Cath: nl cors;  b. 2009 aden mv: nl;  c. 05/2009 Echo: nl. d. 2014: normal nuclear stress test, EF 69%.  . Wears dentures   . Breast  lump   . Hyperlipidemia   . Thyroid disease   . Anemia   . History of blood clots   . Hernia   . Blood transfusion   . Hearing loss   . Arthritis   . GERD (gastroesophageal reflux disease)   . Cancer ([place])     bladder  . Colon cancer ([place])     a. recurrence 2016 with liver mets -  percutaneous liver biopsy and thermal ablation of a liver metastasis by interventional radiology..  . Iron deficiency anemia due to chronic blood loss 07/06/2013  . UTI (urinary tract infection)   . Obstruction of intestine or colon ([place]) 06/2014  . Diabetes mellitus without complication ([place]) 9/56/38    borderline type II; takes no medication for it  . Vision abnormalities 2016    not going blind but having retina problems; might lose ability to see faces  . LBBB (left bundle branch block)   . Asthma     hx of  . Depression   . CKD (chronic kidney disease), stage III     pt. states decreased kidney function  . Headache   . Fibromyalgia   . Achalasia     a. s/p botox injection for achalasia.  . Sinus bradycardia     a. HR 30s in 08/2014 requiring holding of digoxin.    Past Surgical History  Procedure Laterality Date  .  Cardiac catheterization  12/10/2000    THE LEFT VENTRICLE IS MILDY DILATED. THERE IS MILD TO MODERATE DIFFUSE HYPOKINESIS WITH EF 35%  . Colon cancer surgery    . Ileostomy    . Esophagogastroduodenoscopy  02/13/2011    Procedure: ESOPHAGOGASTRODUODENOSCOPY (EGD);  Surgeon: Rogene Houston, MD;  [facility]: [facility];  Service: Endoscopy;  Laterality: N/A;  1030  . Colonoscopy  09/26/2011    Procedure: COLONOSCOPY;  Surgeon: Rogene Houston, MD;  Location: [facility];  Service: Endoscopy;  Laterality: N/A;  215  . Cataract extraction w/phaco  11/13/2011    Procedure: CATARACT EXTRACTION PHACO AND INTRAOCULAR LENS PLACEMENT (IOC);  Surgeon: Tonny Branch, MD;  Location: [facility];  Service: Ophthalmology;  Laterality: Right;  CDE 12.26  . Cataract extraction w/phaco  12/08/2011     Procedure: CATARACT EXTRACTION PHACO AND INTRAOCULAR LENS PLACEMENT (IOC);  Surgeon: Tonny Branch, MD;  Location: [facility];  Service: Ophthalmology;  Laterality: Left;  CDE 15.11  . Esophagogastroduodenoscopy N/A 09/01/2013    Procedure: ESOPHAGOGASTRODUODENOSCOPY (EGD);  Surgeon: Rogene Houston, MD;  [facility]: [facility];  Service: Endoscopy;  Laterality: N/A;  830  . Botox injection N/A 09/01/2013    Procedure: BOTOX INJECTION;  Surgeon: Rogene Houston, MD;  Location: [facility];  Service: Endoscopy;  Laterality: N/A;  . Abdominal hysterectomy    . Cholecystectomy    . Joint replacement Left 2008  . Tonsillectomy    . Colon surgery    . Eye surgery  2014    catarac surgery  . Appendectomy    . Ovarian cystectomy 1955    . Ileostomy    . Coronary angioplasty    . Rotator cuff repair    . Breast surgery      right breast biopsy  . Liver biopsy and ablation  09/01/14    Allergies: Bactrim; Codeine; Demerol; Lidocaine hcl; Morphine and related; Nitrofurantoin monohyd macro; Lisinopril; and Ramipril  Medications: Prior to Admission medications   Medication Sig Start Date End Date Taking? Authorizing Provider  ALPRAZolam Duanne Moron) 0.5 MG tablet Take 0.5 mg by mouth at bedtime.    Yes Historical Provider, MD  amitriptyline (ELAVIL) 10 MG tablet Take 20 mg by mouth at bedtime.    Yes Historical Provider, MD  aspirin EC 325 MG tablet Take 325 mg by mouth daily.   Yes Historical Provider, MD  calcium carbonate (OSCAL) 1500 (600 CA) MG TABS tablet Take by mouth daily with breakfast.   Yes Historical Provider, MD  carvedilol (COREG) 12.5 MG tablet Take 1 tablet (12.5 mg total) by mouth 2 (two) times daily with a meal. 12/06/14  Yes Dayna N Dunn, PA-C  HYDROcodone-acetaminophen (NORCO/VICODIN) 5-325 MG per tablet Take 1 tablet by mouth every 4 (four) hours as needed for moderate pain.   Yes Historical Provider, MD  hyoscyamine (LEVSIN SL) 0.125 MG SL tablet Place 1 tablet (0.125 mg total) under  the tongue every 6 (six) hours as needed (lower abdominal pain). 09/11/14  Yes Rogene Houston, MD  levothyroxine (SYNTHROID, LEVOTHROID) 75 MCG tablet Take 75 mcg by mouth daily.    Yes Historical Provider, MD  losartan-hydrochlorothiazide (HYZAAR) 100-12.5 MG per tablet Take by mouth as directed. 1/2 TABLET DAILY. DO NOT TAKE IF YOUR TOP BLOOD PRESSURE NUMBER IS LESS THAN 110   Yes Historical Provider, MD  Multiple Vitamins-Minerals (EQL GUMMY ADULT) CHEW Chew 2 tablets by mouth daily.    Yes Historical Provider, MD  NITROSTAT 0.4 MG SL  tablet DISSOLVE ONE TABLET UNDER THE TONGUE EVERY 5 MINUTES AS NEEDED FOR CHEST PAIN.  DO NOT EXCEED A TOTAL OF 3 DOSES IN 15 MINUTES 11/15/13  Yes Darlin Coco, MD  Omega-3 Fatty Acids (FISH OIL) 1000 MG CAPS Take 1 capsule by mouth daily.   Yes Historical Provider, MD  ondansetron (ZOFRAN-ODT) 4 MG disintegrating tablet Take 1 tablet (4 mg total) by mouth every 8 (eight) hours as needed for nausea or vomiting. 07/11/13  Yes Davonna Belling, MD  pantoprazole (PROTONIX) 40 MG tablet Take 40 mg by mouth 2 (two) times daily.   Yes Historical Provider, MD  ALPRAZolam Duanne Moron) 0.5 MG tablet Xanax 0.5 mg- she can take 1 tablet 1 hour prior to MRI and can repeat 1 tablet in the waiting room prior to the MRI if needed 12/07/14   [name] Kefalas, PA-C  potassium chloride (MICRO-K) 10 MEQ CR capsule Take 1 capsule (10 mEq total) by mouth 2 (two) times daily. Patient not taking: Reported on 12/19/2014 10/19/14   Patrici Ranks, MD     Family History  Problem Relation Age of Onset  . Stroke    . Hypertension    . Heart disease Mother   . Stroke Mother     deceased  . Cancer Mother     bladder  . Arthritis Mother   . Arthritis Father   . Heart disease Father     decesaed  . Cancer Father     leukemia  . Stroke Sister     alive/debilitated  . Hypertension Sister   . Other Sister     paralysis  . Heart disease Brother     bypass surgery  . Cancer Brother   .  Arthritis Brother   . Stroke Sister     alive/debilitated  . Diabetes Sister   . Other Brother     stomach problems  . Other Brother     bladder    Social History   Social History  . Marital Status: Married    Spouse Name: N/A  . Number of Children: N/A  . Years of Education: N/A   Social History Main Topics  . Smoking status: Former Smoker    Quit date: 02/11/1963  . Smokeless tobacco: Never Used  . Alcohol Use: No  . Drug Use: No  . Sexual Activity: Not on file   Other Topics Concern  . Not on file   Social History Narrative    ECOG Status: 1 - Symptomatic but completely ambulatory  Review of Systems: A 12 point ROS discussed and pertinent positives are indicated in the HPI above.  All other systems are negative.  Review of Systems  Vital Signs: BP 156/70 mmHg  Pulse 69  Temp(Src) 98.1 F (36.7 C) (Oral)  Resp 14  SpO2 99%  Physical Exam  Constitutional: She is oriented to person, place, and time. She appears well-developed and well-nourished. No distress.  HENT:  Head: Normocephalic and atraumatic.  Eyes: No scleral icterus.  Cardiovascular: Normal rate.   Pulmonary/Chest: Effort normal.  Neurological: She is alert and oriented to person, place, and time.  Skin: Skin is warm and dry.  Nursing note and vitals reviewed.   Imaging: Mr Abdomen W Wo Contrast  12/08/2014  CLINICAL DATA:  Metastatic colon cancer initially diagnosed in 1989. Left hepatic metastasis on liver biopsy 3 months ago with subsequent ablation. Subsequent encounter. EXAM: MRI ABDOMEN WITHOUT AND WITH CONTRAST TECHNIQUE: Multiplanar multisequence MR imaging of the abdomen was performed  both before and after the administration of intravenous contrast. CONTRAST:  71mL MULTIHANCE GADOBENATE DIMEGLUMINE 529 MG/ML IV SOLN COMPARISON:  CT 07/13/2014.  MRI 08/23/2014. FINDINGS: Lower chest:  Unremarkable. Hepatobiliary: The RFA defect in segment 2 is best seen on the post-contrast images,  measuring 3.8 x 1.9 cm on image 21 of series 11. There is no suspicious residual abnormal T2 signal in this area. This area is somewhat obscured by aortic pulsation artifact on some of the sequences. There is no suspicious enhancement within the defect. No new hepatic lesions are identified. There are stable small hepatic cysts. No significant biliary dilatation status post cholecystectomy. Pancreas: Fatty replacement. No evidence of pancreatic mass or surrounding inflammation. Spleen: Normal in size without focal abnormality. Adrenals/Urinary Tract: Both adrenal glands appear normal. Bilateral renal cysts are unchanged. Mildly complex lesion in the lower pole of the left kidney measures 12 mm and demonstrates intermediate T1 and low T2 signal, but no abnormal enhancement (confirmed on subtracted images). There is no evidence of enhancing renal mass or hydronephrosis. Stomach/Bowel: No evidence of bowel wall thickening, distention or surrounding inflammatory change. Vascular/Lymphatic: There are no enlarged abdominal lymph nodes. There are stable small lymph nodes within the porta hepatis. No significant vascular findings. Other: Postsurgical changes in the anterior abdominal wall with right lower quadrant ileostomy and diffuse thinning of the abdominal wall musculature, incompletely visualized, but grossly stable. Musculoskeletal: No acute or significant osseous findings. IMPRESSION: 1. Interval successful ablation of metastasis within segment 2 of the liver. There is no suspicious enhancement within the ablated lesion. 2. No new lesions identified. 3. Stable postsurgical changes within right anterior abdominal wall. 4. Stable bilateral renal cysts, including mildly complex lesion in the lower pole of the left kidney. Electronically Signed   By: Richardean Sale M.D.   On: 12/08/2014 16:02    Labs:  CBC:  Recent Labs  09/01/14 1840 09/02/14 0346 10/19/14 1045 12/07/14 1512  WBC 6.8 5.2 4.5 4.4  HGB  11.2* 10.8* 11.1* 11.2*  HCT 34.4* 33.4* 33.9* 34.7*  PLT 127* 149* 172 198    COAGS:  Recent Labs  08/30/14 0935 12/07/14 1512  INR 1.12 1.04  APTT 34  --     BMP:  Recent Labs  09/01/14 1840 09/02/14 0346 10/19/14 1045 12/06/14 1444 12/07/14 1512  NA 141 139 140 142 141  K 4.3 4.0 3.4* 4.1 4.0  CL 107 106 107 104 103  CO2 26 25 28 29 27   GLUCOSE 141* 160* 77 108* 108*  BUN 31* 31* 20 25 17   CALCIUM 9.1 9.0 8.8* 9.3 9.3  CREATININE 1.30* 1.31* 1.05* 1.01* 0.95*  GFRNONAA 37* 37* 48*  --  56*  GFRAA 43* 43* 56*  --  64    LIVER FUNCTION TESTS:  Recent Labs  08/30/14 0935 09/01/14 1840 09/02/14 0346 10/19/14 1045 12/07/14 1512  BILITOT 0.5  --  0.9 0.4 0.5  AST 22  --  147* 18 17  ALT 18  --  95* 12* 8  ALKPHOS 65  --  64 58 63  PROT 7.3  --  6.8 6.8 6.6  ALBUMIN 3.8 3.6 3.5 3.5 3.8    TUMOR MARKERS:  Recent Labs  12/30/13 1106 06/30/14 1120 10/19/14 1045 12/07/14 1512  CEA 6.1* 35.5* 25.3* 12.0*    Assessment and Plan:  Initial post procedure imaging demonstrates an excellent ablation zone completely encompassing the biopsy proven metastatic colonic adenocarcinoma with good margin. There is no evidence of residual or new  disease in the liver.  CEA has come down nicely to 12.0 from 35.5 but still remains elevated. Given Ms. Lemay's continued right sided abdominal and hip pain, I have some concern that she could have disease in these regions. However, this could simply be a manifestation of her previous chronic pain and fibromyalgia. We could consider further imaging with PET/CT. I will discuss this with Dr. Whitney Muse.  Her description of occasional bowel movements despite her diverting colostomy likely represent passage of secretory mucus. I am not sure what to make of her description of a bloody movement in her colostomy bag recently. Her recent labs demonstrate no progression of her chronic mild anemia.  1.) Continued surveillance of hepatic  ablation site with return clinic visit in 3 months including repeat liver MRI (unless PET CT is ordered in the interim).   SignedJacqulynn Cadet 12/19/2014, 4:11 PM   I spent a total of  15 Minutes in face to face in clinical consultation, greater than 50% of which was counseling/coordinating care for colon cancer metastatic to the liver

## 2014-12-20 DIAGNOSIS — H353110 Nonexudative age-related macular degeneration, right eye, stage unspecified: Secondary | ICD-10-CM | POA: Diagnosis not present

## 2015-01-02 ENCOUNTER — Encounter (HOSPITAL_COMMUNITY): Payer: Medicare Other

## 2015-01-02 ENCOUNTER — Ambulatory Visit (HOSPITAL_COMMUNITY): Payer: Medicare Other | Admitting: Hematology & Oncology

## 2015-01-10 NOTE — Progress Notes (Signed)
Karen Hilding, MD [place] / [place] [address]   DIAGNOSIS: Colon carcinoma with permanent ostomy Diversion Colitis Achalasia Iron deficiency, anemia secondary to GI related blood loss Hemeoccult positive stools Rising CEA with CT imaging on 07/13/2014 1.3 cm mass in L hepatic lobe Subcapsular mass in anterior pole of L kidney suspicious for RCC Cutaneous microwave ablation of her now biopsy-proven metastatic colonic adenocarcinoma to the left liver 09/01/2014  CURRENT THERAPY: IV iron, intolerance to oral iron and Observation  History of Present Illness: Quentin returns today for further follow-up of stage IV CRC s/p microwave ablation of a solitary liver lesion. She also has a L renal lesion that is under observation. She has other health issues including chronic LBP.  Her major complaint revolves around rectal discharge. She notes that she has had this for a while but it is now increased significantly and she also has increased bleeding. She states at times it is almost like she has had a "bowel movement" with very thin appearing stool in the toilet. She also describes worsening lower abdominal cramping.  She also notes a lesion on the R ear that has been there for some time. She states it has been removed twice and continues to recur. She describes it as painful and would like a referral for further evaluation.  CEA initially improved after her ablation but has not fallen into normal ranges. It is uncertain as to the cause.   Her back pain continues to be a problem. She has to sleep in the recliner a lot of nights because lying flat is very uncomfortable.    MEDICAL HISTORY: Past Medical History  Diagnosis Date  . Chronic diastolic CHF (congestive heart failure) (HCC)     a. nl EF by echo 05/2009.  Marland Kitchen DVT of deep femoral vein ([place])   . History of PSVT (paroxysmal supraventricular tachycardia)   . Hypertension   . Abdominal adhesions   . Ileostomy present ([place])   .  Colon polyp   . Chest pain     a. 2002 Cath: nl cors;  b. 2009 aden mv: nl;  c. 05/2009 Echo: nl. d. 2014: normal nuclear stress test, EF 69%.  . Wears dentures   . Breast lump   . Hyperlipidemia   . Thyroid disease   . Anemia   . History of blood clots   . Hernia   . Blood transfusion   . Hearing loss   . Arthritis   . GERD (gastroesophageal reflux disease)   . Cancer ([place])     bladder  . Colon cancer ([place])     a. recurrence 2016 with liver mets -  percutaneous liver biopsy and thermal ablation of a liver metastasis by interventional radiology..  . Iron deficiency anemia due to chronic blood loss 07/06/2013  . UTI (urinary tract infection)   . Obstruction of intestine or colon ([place]) 06/2014  . Diabetes mellitus without complication ([place]) [id number]    borderline type II; takes no medication for it  . Vision abnormalities 2016    not going blind but having retina problems; might lose ability to see faces  . LBBB (left bundle branch block)   . Asthma     hx of  . Depression   . CKD (chronic kidney disease), stage III     pt. states decreased kidney function  . Headache   . Fibromyalgia   . Achalasia     a. s/p botox injection for achalasia.  Marland Kitchen  Sinus bradycardia     a. HR 30s in 08/2014 requiring holding of digoxin.    has Left bundle branch block; Chest pain, exertional; Benign hypertensive heart disease without heart failure; Hypothyroid; Small bowel obstruction, partial ([place]); Dysphagia; Paroxysmal SVT (supraventricular tachycardia) ([place]); Ileostomy status ([place]); Colon cancer ([place]); Unstable angina ([place]); Parastomal hernia; Iron deficiency anemia due to chronic blood loss; Malabsorption of iron; Acute abdominal pain; Acute renal failure ([place]); Hyperglycemia; UTI (urinary tract infection); Small bowel obstruction ([place]); Abdominal pain, acute; Liver mass, left lobe; Colon cancer metastasized to liver [facility]); Liver lesion; and Sinus bradycardia on her problem list.     is allergic to  bactrim; codeine; demerol; lidocaine hcl; morphine and related; nitrofurantoin monohyd macro; lisinopril; and ramipril.   Iron Infusion in March 2016. Mammogram performed in 2015. Reported multiple hernias.   SURGICAL HISTORY: Past Surgical History  Procedure Laterality Date  . Cardiac catheterization  12/10/2000    THE LEFT VENTRICLE IS MILDY DILATED. THERE IS MILD TO MODERATE DIFFUSE HYPOKINESIS WITH EF 35%  . Colon cancer surgery    . Ileostomy    . Esophagogastroduodenoscopy  02/13/2011    Procedure: ESOPHAGOGASTRODUODENOSCOPY (EGD);  Surgeon: Rogene Houston, MD;  [facility];  Service: Endoscopy;  Laterality: N/A;  1030  . Colonoscopy  09/26/2011    Procedure: COLONOSCOPY;  Surgeon: Rogene Houston, MD;  [facility]: [facility];  Service: Endoscopy;  Laterality: N/A;  215  . Cataract extraction w/phaco  11/13/2011    Procedure: CATARACT EXTRACTION PHACO AND INTRAOCULAR LENS PLACEMENT (IOC);  Surgeon: Tonny Branch, MD;  Location: [facility];  Service: Ophthalmology;  Laterality: Right;  CDE 12.26  . Cataract extraction w/phaco  12/08/2011    Procedure: CATARACT EXTRACTION PHACO AND INTRAOCULAR LENS PLACEMENT (IOC);  Surgeon: Tonny Branch, MD;  Location: [facility];  Service: Ophthalmology;  Laterality: Left;  CDE 15.11  . Esophagogastroduodenoscopy N/A 09/01/2013    Procedure: ESOPHAGOGASTRODUODENOSCOPY (EGD);  Surgeon: Rogene Houston, MD;  [facility];  Service: Endoscopy;  Laterality: N/A;  830  . Botox injection N/A 09/01/2013    Procedure: BOTOX INJECTION;  Surgeon: Rogene Houston, MD;  [facility];  Service: Endoscopy;  Laterality: N/A;  . Abdominal hysterectomy    . Cholecystectomy    . Joint replacement Left 2008  . Tonsillectomy    . Colon surgery    . Eye surgery  2014    catarac surgery  . Appendectomy    . Ovarian cystectomy 1955    . Ileostomy    . Coronary angioplasty    . Rotator cuff repair    . Breast surgery      right breast biopsy  .  Liver biopsy and ablation  09/01/14    SOCIAL HISTORY: Social History   Social History  . Marital Status: Married    Spouse Name: N/A  . Number of Children: N/A  . Years of Education: N/A   Occupational History  . Not on file.   Social History Main Topics  . Smoking status: Former Smoker    Quit date: 02/11/1963  . Smokeless tobacco: Never Used  . Alcohol Use: No  . Drug Use: No  . Sexual Activity: Not on file   Other Topics Concern  . Not on file   Social History Narrative   Reads inspirational books   FAMILY HISTORY: Family History  Problem Relation Age of Onset  . Stroke    . Hypertension    . Heart  disease Mother   . Stroke Mother     deceased  . Cancer Mother     bladder  . Arthritis Mother   . Arthritis Father   . Heart disease Father     decesaed  . Cancer Father     leukemia  . Stroke Sister     alive/debilitated  . Hypertension Sister   . Other Sister     paralysis  . Heart disease Brother     bypass surgery  . Cancer Brother   . Arthritis Brother   . Stroke Sister     alive/debilitated  . Diabetes Sister   . Other Brother     stomach problems  . Other Brother     bladder    Review of Systems  Constitutional: Negative for fever, chills, weight loss and malaise/fatigue.  HENT: Positive for hearing loss. Negative for congestion, nosebleeds, sore throat and tinnitus.   Eyes: Negative for blurred vision, double vision, pain and discharge.  Respiratory: Negative for cough, hemoptysis, sputum production, shortness of breath and wheezing.   Cardiovascular: Negative for chest pain, palpitations, claudication, leg swelling and PND.  Gastrointestinal:  Negative for heartburn, abdominal pain, diarrhea, constipation, blood in stool and melena. ALTERED TASTE Genitourinary: Negative for dysuria, urgency, frequency and hematuria.  Musculoskeletal: Positive for joint pain. Negative for myalgias and falls.  Skin: Negative for itching and rash.    Neurological: Positive for weakness. Negative for dizziness, tingling, tremors, sensory change, speech change, focal weakness, seizures, loss of consciousness and headaches.  Endo/Heme/Allergies: Does not bruise/bleed easily.  Psychiatric/Behavioral: Negative for depression, suicidal ideas, memory loss and substance abuse. The patient is not nervous/anxious and does not have insomnia.   14 point review of systems was performed and is negative except as detailed under history of present illness and above    PHYSICAL EXAMINATION  ECOG PERFORMANCE STATUS: 1 - Symptomatic but completely ambulatory  Filed Vitals:   01/11/15 1300  BP: 142/63  Pulse: 69  Temp: 98.5 F (36.9 C)  Resp: 18    Physical Exam  Constitutional: She is oriented to person, place, and time and well-developed, well-nourished, and in no distress.  HENT:  Head: Normocephalic and atraumatic. R ear with small deformity from prior surgery, area of erythema and eschar noted Nose: Nose normal.  Mouth/Throat: Oropharynx is clear and moist. No oropharyngeal exudate.  Eyes: Conjunctivae and EOM are normal. Pupils are equal, round, and reactive to light. Right eye exhibits no discharge. Left eye exhibits no discharge. No scleral icterus.  Neck: Normal range of motion. Neck supple. No tracheal deviation present. No thyromegaly present.  Cardiovascular: Normal rate, regular rhythm and normal heart sounds.  Exam reveals no gallop and no friction rub.   No murmur heard. Pulmonary/Chest: Effort normal and breath sounds normal. She has no wheezes. She has no rales.  Abdominal: Soft. Bowel sounds are normal. . There is no tenderness. There is no rebound and no guarding. ostomy site is intact with watery dark stool. Hernia noted. Incision sites all WNL Musculoskeletal: Normal range of motion. She exhibits no edema.  Lymphadenopathy:    She has no cervical adenopathy.  Neurological: She is alert and oriented to person, place, and  time. She has normal reflexes. No cranial nerve deficit.  Skin: Skin is warm and dry. No rash noted.  Psychiatric: Mood, memory, affect and judgment normal.  Nursing note and vitals reviewed.    LABORATORY DATA: I have reviewed the data as listed. CBC  Component Value Date/Time   WBC 5.0 01/11/2015 1430   RBC 3.99 01/11/2015 1430   RBC 3.45* 11/24/2008 1500   HGB 11.6* 01/11/2015 1430   HCT 35.6* 01/11/2015 1430   PLT 175 01/11/2015 1430   MCV 89.2 01/11/2015 1430   MCH 29.1 01/11/2015 1430   MCHC 32.6 01/11/2015 1430   RDW 12.6 01/11/2015 1430   LYMPHSABS 1.1 01/11/2015 1430   MONOABS 0.5 01/11/2015 1430   EOSABS 0.2 01/11/2015 1430   BASOSABS 0.0 01/11/2015 1430   CMP     Component Value Date/Time   NA 140 01/11/2015 1430   K 3.7 01/11/2015 1430   CL 104 01/11/2015 1430   CO2 27 01/11/2015 1430   GLUCOSE 90 01/11/2015 1430   BUN 20 01/11/2015 1430   CREATININE 1.03* 01/11/2015 1430   CREATININE 0.95* 12/07/2014 1512   CALCIUM 9.2 01/11/2015 1430   PROT 7.0 01/11/2015 1430   ALBUMIN 3.6 01/11/2015 1430   AST 18 01/11/2015 1430   ALT 12* 01/11/2015 1430   ALKPHOS 63 01/11/2015 1430   BILITOT 0.7 01/11/2015 1430   GFRNONAA 49* 01/11/2015 1430   GFRNONAA 56* 12/07/2014 1512   GFRAA 57* 01/11/2015 1430   GFRAA 64 12/07/2014 1512   RADIOLOGY: I have reviewed the images detailed below and agree with the results:   CLINICAL DATA: Metastatic colon cancer initially diagnosed in 1989. Left hepatic metastasis on liver biopsy 3 months ago with subsequent ablation. Subsequent encounter.  EXAM: MRI ABDOMEN WITHOUT AND WITH CONTRAST  TECHNIQUE: Multiplanar multisequence MR imaging of the abdomen was performed both before and after the administration of intravenous contrast.  CONTRAST: 62mL MULTIHANCE GADOBENATE DIMEGLUMINE 529 MG/ML IV SOLN  COMPARISON: CT 07/13/2014. MRI 08/23/2014.  FINDINGS: Lower chest: Unremarkable.  Hepatobiliary: The RFA  defect in segment 2 is best seen on the post-contrast images, measuring 3.8 x 1.9 cm on image 21 of series 11. There is no suspicious residual abnormal T2 signal in this area. This area is somewhat obscured by aortic pulsation artifact on some of the sequences. There is no suspicious enhancement within the defect. No new hepatic lesions are identified. There are stable small hepatic cysts. No significant biliary dilatation status post cholecystectomy.  Pancreas: Fatty replacement. No evidence of pancreatic mass or surrounding inflammation.  Spleen: Normal in size without focal abnormality.  Adrenals/Urinary Tract: Both adrenal glands appear normal. Bilateral renal cysts are unchanged. Mildly complex lesion in the lower pole of the left kidney measures 12 mm and demonstrates intermediate T1 and low T2 signal, but no abnormal enhancement (confirmed on subtracted images). There is no evidence of enhancing renal mass or hydronephrosis.  Stomach/Bowel: No evidence of bowel wall thickening, distention or surrounding inflammatory change.  Vascular/Lymphatic: There are no enlarged abdominal lymph nodes. There are stable small lymph nodes within the porta hepatis. No significant vascular findings.  Other: Postsurgical changes in the anterior abdominal wall with right lower quadrant ileostomy and diffuse thinning of the abdominal wall musculature, incompletely visualized, but grossly stable.  Musculoskeletal: No acute or significant osseous findings.  IMPRESSION: 1. Interval successful ablation of metastasis within segment 2 of the liver. There is no suspicious enhancement within the ablated lesion. 2. No new lesions identified. 3. Stable postsurgical changes within right anterior abdominal wall. 4. Stable bilateral renal cysts, including mildly complex lesion in the lower pole of the left kidney.   Electronically Signed  By: Richardean Sale M.D.  On: 12/08/2014 16:02      ASSESSMENT and THERAPY PLAN:  Colon carcinoma with permanent ostomy Diversion Colitis Achalasia Iron deficiency, anemia secondary to GI related blood loss Hemeoccult positive stools Rising CEA with CT imaging on 07/13/2014 1.3 cm mass in L hepatic lobe Subcapsular mass in anterior pole of L kidney suspicious for RCC Biopsy of L liver lesion and MWA Left liver lesion 09/01/2014 Taste Alteration   I am arranging for PET imaging to f/u with her persistently elevated CEA. Bianney unfortunately is not a good candidate for systemic therapy if needed.  Her CEA may be elevated from colitis as well, but hopefully imaging will help rule this in/out and help to clarify.  I will see her back post PET for additional discussion and recommendations.  If her PET is unremarkable I will get her back to Dr. Laural Golden.  I have referred her to [facility] for her ear.  Ferritin is pending, we will keep her apprised of the results when available.   All questions were answered. The patient knows to call the clinic with any problems, questions or concerns. We can certainly see the patient much sooner if necessary.   This document was electronically signed.   This note was electronically signed.    Kelby Fam. Whitney Muse, MD

## 2015-01-11 ENCOUNTER — Telehealth (HOSPITAL_COMMUNITY): Payer: Self-pay | Admitting: Hematology & Oncology

## 2015-01-11 ENCOUNTER — Encounter (HOSPITAL_COMMUNITY): Payer: Medicare Other | Attending: Hematology and Oncology | Admitting: Hematology & Oncology

## 2015-01-11 ENCOUNTER — Encounter (HOSPITAL_COMMUNITY): Payer: Self-pay | Admitting: Hematology & Oncology

## 2015-01-11 VITALS — BP 142/63 | HR 69 | Temp 98.5°F | Resp 18 | Wt 147.1 lb

## 2015-01-11 DIAGNOSIS — K6289 Other specified diseases of anus and rectum: Secondary | ICD-10-CM | POA: Diagnosis not present

## 2015-01-11 DIAGNOSIS — M545 Low back pain: Secondary | ICD-10-CM

## 2015-01-11 DIAGNOSIS — D500 Iron deficiency anemia secondary to blood loss (chronic): Secondary | ICD-10-CM

## 2015-01-11 DIAGNOSIS — K769 Liver disease, unspecified: Secondary | ICD-10-CM

## 2015-01-11 DIAGNOSIS — C189 Malignant neoplasm of colon, unspecified: Secondary | ICD-10-CM

## 2015-01-11 DIAGNOSIS — R439 Unspecified disturbances of smell and taste: Secondary | ICD-10-CM

## 2015-01-11 DIAGNOSIS — C787 Secondary malignant neoplasm of liver and intrahepatic bile duct: Secondary | ICD-10-CM

## 2015-01-11 DIAGNOSIS — N289 Disorder of kidney and ureter, unspecified: Secondary | ICD-10-CM | POA: Diagnosis not present

## 2015-01-11 DIAGNOSIS — D509 Iron deficiency anemia, unspecified: Secondary | ICD-10-CM

## 2015-01-11 DIAGNOSIS — R160 Hepatomegaly, not elsewhere classified: Secondary | ICD-10-CM

## 2015-01-11 DIAGNOSIS — R970 Elevated carcinoembryonic antigen [CEA]: Secondary | ICD-10-CM | POA: Diagnosis not present

## 2015-01-11 LAB — COMPREHENSIVE METABOLIC PANEL
ALT: 12 U/L — ABNORMAL LOW (ref 14–54)
ANION GAP: 9 (ref 5–15)
AST: 18 U/L (ref 15–41)
Albumin: 3.6 g/dL (ref 3.5–5.0)
Alkaline Phosphatase: 63 U/L (ref 38–126)
BILIRUBIN TOTAL: 0.7 mg/dL (ref 0.3–1.2)
BUN: 20 mg/dL (ref 6–20)
CO2: 27 mmol/L (ref 22–32)
Calcium: 9.2 mg/dL (ref 8.9–10.3)
Chloride: 104 mmol/L (ref 101–111)
Creatinine, Ser: 1.03 mg/dL — ABNORMAL HIGH (ref 0.44–1.00)
GFR, EST AFRICAN AMERICAN: 57 mL/min — AB (ref >60)
GFR, EST NON AFRICAN AMERICAN: 49 mL/min — AB (ref >60)
Glucose, Bld: 90 mg/dL (ref 65–99)
POTASSIUM: 3.7 mmol/L (ref 3.5–5.1)
Sodium: 140 mmol/L (ref 135–145)
TOTAL PROTEIN: 7 g/dL (ref 6.5–8.1)

## 2015-01-11 LAB — CBC WITH DIFFERENTIAL/PLATELET
Basophils Absolute: 0 10*3/uL (ref 0.0–0.1)
Basophils Relative: 0 %
EOS PCT: 3 %
Eosinophils Absolute: 0.2 10*3/uL (ref 0.0–0.7)
HEMATOCRIT: 35.6 % — AB (ref 36.0–46.0)
Hemoglobin: 11.6 g/dL — ABNORMAL LOW (ref 12.0–15.0)
LYMPHS PCT: 23 %
Lymphs Abs: 1.1 10*3/uL (ref 0.7–4.0)
MCH: 29.1 pg (ref 26.0–34.0)
MCHC: 32.6 g/dL (ref 30.0–36.0)
MCV: 89.2 fL (ref 78.0–100.0)
MONO ABS: 0.5 10*3/uL (ref 0.1–1.0)
MONOS PCT: 11 %
NEUTROS ABS: 3.2 10*3/uL (ref 1.7–7.7)
Neutrophils Relative %: 63 %
PLATELETS: 175 10*3/uL (ref 150–400)
RBC: 3.99 MIL/uL (ref 3.87–5.11)
RDW: 12.6 % (ref 11.5–15.5)
WBC: 5 10*3/uL (ref 4.0–10.5)

## 2015-01-11 LAB — FERRITIN: FERRITIN: 64 ng/mL (ref 11–307)

## 2015-01-11 NOTE — Progress Notes (Signed)
Karen [name]'s reason for visit today is for labs as scheduled per MD orders.  Venipuncture performed with a 23 gauge butterfly needle to R Antecubital.  Karen [name] tolerated procedure well and without incident; questions were answered and patient was discharged.

## 2015-01-11 NOTE — Patient Instructions (Addendum)
[place] at [name] Discharge Instructions  RECOMMENDATIONS MADE BY THE CONSULTANT AND ANY TEST RESULTS WILL BE SENT TO YOUR REFERRING PHYSICIAN.   Exam completed by Dr Whitney Muse today PET scan scheduled at [facility] After the PET scan you can either keep your appt with [place], or if we call you with other instructions. Referral sent to [facility], they will call you.   Return to see the doctor after PET scan  Please call the clinic if you have any questions or concerns   Thank you for choosing [place] at [facility] to provide your oncology and hematology care.  To afford each patient quality time with our provider, please arrive at least 15 minutes before your scheduled appointment time.    You need to re-schedule your appointment should you arrive 10 or more minutes late.  We strive to give you quality time with our providers, and arriving late affects you and other patients whose appointments are after yours.  Also, if you no show three or more times for appointments you may be dismissed from the clinic at the providers discretion.     Again, thank you for choosing [facility].  Our hope is that these requests will decrease the amount of time that you wait before being seen by our physicians.       _____________________________________________________________  Should you have questions after your visit to [facility], please contact our office at [phone] between the hours of 8:30 a.m. and 4:30 p.m.  Voicemails left after 4:30 p.m. will not be returned until the following business day.  For prescription refill requests, have your pharmacy contact our office.

## 2015-01-12 LAB — CEA: CEA: 41.6 ng/mL — AB (ref 0.0–4.7)

## 2015-01-18 ENCOUNTER — Ambulatory Visit
Admission: RE | Admit: 2015-01-18 | Discharge: 2015-01-18 | Disposition: A | Discharge status: Home / Self Care | Payer: Medicare Other | Source: Ambulatory Visit | Attending: Hematology & Oncology | Admitting: Hematology & Oncology

## 2015-01-18 DIAGNOSIS — I700 Atherosclerosis of aorta: Secondary | ICD-10-CM | POA: Diagnosis not present

## 2015-01-18 DIAGNOSIS — K6389 Other specified diseases of intestine: Secondary | ICD-10-CM | POA: Diagnosis not present

## 2015-01-18 DIAGNOSIS — C787 Secondary malignant neoplasm of liver and intrahepatic bile duct: Secondary | ICD-10-CM

## 2015-01-18 DIAGNOSIS — R160 Hepatomegaly, not elsewhere classified: Secondary | ICD-10-CM | POA: Diagnosis not present

## 2015-01-18 DIAGNOSIS — C189 Malignant neoplasm of colon, unspecified: Secondary | ICD-10-CM

## 2015-01-18 LAB — GLUCOSE, CAPILLARY: GLUCOSE-CAPILLARY: 87 mg/dL (ref 65–99)

## 2015-01-18 MED ORDER — FLUDEOXYGLUCOSE F - 18 (FDG) INJECTION
13.2900 | Freq: Once | INTRAVENOUS | Status: AC | PRN
  Administered 2015-01-18: 13.29 via INTRAVENOUS

## 2015-01-19 ENCOUNTER — Encounter (HOSPITAL_BASED_OUTPATIENT_CLINIC_OR_DEPARTMENT_OTHER): Payer: Medicare Other | Admitting: Oncology

## 2015-01-19 ENCOUNTER — Other Ambulatory Visit (HOSPITAL_COMMUNITY)
Admission: RE | Admit: 2015-01-19 | Discharge: 2015-01-19 | Disposition: A | Discharge status: Home / Self Care | Payer: Medicare Other | Source: Ambulatory Visit | Attending: Oncology | Admitting: Oncology

## 2015-01-19 ENCOUNTER — Encounter (HOSPITAL_COMMUNITY): Payer: Self-pay | Admitting: Oncology

## 2015-01-19 VITALS — BP 144/74 | HR 75 | Temp 98.1°F | Resp 18 | Wt 147.0 lb

## 2015-01-19 DIAGNOSIS — K529 Noninfective gastroenteritis and colitis, unspecified: Secondary | ICD-10-CM | POA: Diagnosis not present

## 2015-01-19 DIAGNOSIS — C787 Secondary malignant neoplasm of liver and intrahepatic bile duct: Secondary | ICD-10-CM | POA: Diagnosis not present

## 2015-01-19 DIAGNOSIS — C189 Malignant neoplasm of colon, unspecified: Secondary | ICD-10-CM

## 2015-01-19 DIAGNOSIS — D509 Iron deficiency anemia, unspecified: Secondary | ICD-10-CM | POA: Diagnosis not present

## 2015-01-19 MED ORDER — CAPECITABINE 500 MG PO TABS: 1000.0000 mg/m2 | ORAL_TABLET | Freq: Two times a day (BID) | ORAL | Status: DC

## 2015-01-19 MED ORDER — ONDANSETRON 4 MG PO TBDP: 4.0000 mg | ORAL_TABLET | Freq: Three times a day (TID) | ORAL | Status: DC | PRN

## 2015-01-19 NOTE — Patient Instructions (Signed)
[place] at [facility] Discharge Instructions  RECOMMENDATIONS MADE BY THE CONSULTANT AND ANY TEST RESULTS WILL BE SENT TO YOUR REFERRING PHYSICIAN.  Exam and discussion by Robynn Pane, PA-C Will treat you with Xeloda which you will take for 7 days and will be off for 7 days. Lupita Raider will contact you and schedule you for chemotherapy teaching. We will contact Dr. Olevia Perches office to see if he can see you sooner about your bowel issues.  Follow-up to be scheduled after you meet with [facility].   Thank you for choosing [place] at [facility] to provide your oncology and hematology care.  To afford each patient quality time with our provider, please arrive at least 15 minutes before your scheduled appointment time.    You need to re-schedule your appointment should you arrive 10 or more minutes late.  We strive to give you quality time with our providers, and arriving late affects you and other patients whose appointments are after yours.  Also, if you no show three or more times for appointments you may be dismissed from the clinic at the providers discretion.     Again, thank you for choosing [facility].  Our hope is that these requests will decrease the amount of time that you wait before being seen by our physicians.       _____________________________________________________________  Should you have questions after your visit to [facility], please contact our office at [phone] between the hours of 8:30 a.m. and 4:30 p.m.  Voicemails left after 4:30 p.m. will not be returned until the following business day.  For prescription refill requests, have your pharmacy contact our office.    Capecitabine tablets What is this medicine? CAPECITABINE (ka pe SITE a been) is a chemotherapy drug. It slows the growth of cancer cells. This medicine is used to treat breast cancer, and also colon or rectal cancer. This  medicine may be used for other purposes; ask your health care provider or pharmacist if you have questions. What should I tell my health care provider before I take this medicine? They need to know if you have any of these conditions: -bleeding or blood disorders -dihydropyrimidine dehydrogenase (DPD) deficiency -heart disease -infection (especially a virus infection such as chickenpox, cold sores, or herpes) -kidney disease -liver disease -an unusual or allergic reaction to capecitabine, 5-fluorouracil, other medicines, foods, dyes, or preservatives -pregnant or trying to get pregnant -breast-feeding How should I use this medicine? Take this medicine by mouth with a glass of water, within 30 minutes of the end of a meal. Do not cut, crush or chew this medicine. Follow the directions on the prescription label. Take your medicine at regular intervals. Do not take it more often than directed. Do not stop taking except on your doctor's advice. Your doctor may want you to take a combination of 150 mg and 500 mg tablets for each dose. It is very important that you know how to correctly take your dose. Taking the wrong tablets could result in an overdose (too much medication) or underdose (too little medication). Talk to your pediatrician regarding the use of this medicine in children. Special care may be needed. Overdosage: If you think you have taken too much of this medicine contact a poison control center or emergency room at once. NOTE: This medicine is only for you. Do not share this medicine with others. What if I miss a dose? If you  miss a dose, do not take the missed dose at all. Do not take double or extra doses. Instead, continue with your next scheduled dose and check with your doctor. What may interact with this medicine? -antacids with aluminum and/or magnesium -folic acid -leucovorin -medicines to increase blood counts like filgrastim, pegfilgrastim,  sargramostim -phenytoin -vaccines -warfarin Talk to your doctor or health care professional before taking any of these medicines: -acetaminophen -aspirin -ibuprofen -ketoprofen -naproxen This list may not describe all possible interactions. Give your health care provider a list of all the medicines, herbs, non-prescription drugs, or dietary supplements you use. Also tell them if you smoke, drink alcohol, or use illegal drugs. Some items may interact with your medicine. What should I watch for while using this medicine? Visit your doctor for checks on your progress. This drug may make you feel generally unwell. This is not uncommon, as chemotherapy can affect healthy cells as well as cancer cells. Report any side effects. Continue your course of treatment even though you feel ill unless your doctor tells you to stop. In some cases, you may be given additional medicines to help with side effects. Follow all directions for their use. Call your doctor or health care professional for advice if you get a fever, chills or sore throat, or other symptoms of a cold or flu. Do not treat yourself. This drug decreases your body's ability to fight infections. Try to avoid being around people who are sick. This medicine may increase your risk to bruise or bleed. Call your doctor or health care professional if you notice any unusual bleeding. Be careful brushing and flossing your teeth or using a toothpick because you may get an infection or bleed more easily. If you have any dental work done, tell your dentist you are receiving this medicine. Avoid taking products that contain aspirin, acetaminophen, ibuprofen, naproxen, or ketoprofen unless instructed by your doctor. These medicines may hide a fever. Do not become pregnant while taking this medicine. Women should inform their doctor if they wish to become pregnant or think they might be pregnant. There is a potential for serious side effects to an unborn child.  Talk to your health care professional or pharmacist for more information. Do not breast-feed an infant while taking this medicine. Men are advised not to father a child while taking this medicine. What side effects may I notice from receiving this medicine? Side effects that you should report to your doctor or health care professional as soon as possible: -allergic reactions like skin rash, itching or hives, swelling of the face, lips, or tongue -low blood counts - this medicine may decrease the number of white blood cells, red blood cells and platelets. You may be at increased risk for infections and bleeding. -signs of infection - fever or chills, cough, sore throat, pain or difficulty passing urine -signs of decreased platelets or bleeding - bruising, pinpoint red spots on the skin, black, tarry stools, blood in the urine -signs of decreased red blood cells - unusually weak or tired, fainting spells, lightheadedness -breathing problems -changes in vision -chest pain -dark urine -diarrhea of more than 4 bowel movements in one day or any diarrhea at night; bloody or watery diarrhea -dizziness -mouth sores -nausea and vomiting -pain, tingling, numbness in the hands or feet -redness, swelling, or sores on hands or feet -stomach pain -vomiting -yellow color of skin or eyes Side effects that usually do not require medical attention (report to your doctor or health care professional if  they continue or are bothersome): -constipation -diarrhea -dry or itchy skin -hair loss -loss of appetite -nausea -weak or tired This list may not describe all possible side effects. Call your doctor for medical advice about side effects. You may report side effects to FDA at 1-800-FDA-1088. Where should I keep my medicine? Keep out of the reach of children. Store at room temperature between 15 and 30 degrees C (59 and 86 degrees F). Keep container tightly closed. Throw away any unused medicine after the  expiration date. NOTE: This sheet is a summary. It may not cover all possible information. If you have questions about this medicine, talk to your doctor, pharmacist, or health care provider.    2016, Elsevier/[employer] Standard. (2014-03-13 17:03:30)

## 2015-01-19 NOTE — Patient Instructions (Addendum)
[place]   CHEMOTHERAPY INSTRUCTIONS  Xeloda - diarrhea, hand-foot syndrome (hands/feet can get red/tender/blister and skin can peel). Avoid friction and hot environments on hands/feet. Wear cotton socks. Lotion twice a day to hands/fingers/feet/toes with Udder cream. Mucositis (inflammation of any mucosal membrane can develop -this can occur in the throat/mouth). Mouth sores, nausea/vomiting, anemia, fatigue can also develop. Take Imodium if diarrhea develops and contact us immediately - we will give you further instructions on how to take your Imodium. Xeloda usually comes with a teaching packet from the mail order/specialty pharmacy that will supply you with the drug that is really informative about diarrhea and hand-foot syndrome. No pregnant, child bearing age people, or animals should come into contact with this drug. Even though this is in pill form - it is still very powerful!!! If you should be instructed to discontinue taking this drug, bring the drug into the [facility] and we will dispose of it in a safe manner. Chemotherapy is a biohazard and must be disposed of properly. Do not touch this pill much. It would be best if the caregiver wore gloves while handling. Do not put this pill in with the rest of your pills in a pill box. Keep them in a separate pill box. You should take this medication within 30 minutes after eating your am and pm meals.   Your dosage of Xeloda: 3 tablets in the morning after breakfast and 3 tablets in the evening after supper for 7 days in a row followed by 7 days of no Xeloda. (take within 30 minutes of eating meals) Please refer to your calendar.   If you notice that Xeloda makes you a little sick on your stomach, please take a Zofran/Ondansetron 30 minutes prior to morning and evening doses and see if that helps relieve the nausea.    POTENTIAL SIDE EFFECTS OF TREATMENT: Increased Susceptibility to Infection, Vomiting, Hair  Thinning, Changes in Character of Skin and Nails (brittleness, dryness,etc.), Pigment Changes (darkening of veins, nail beds, palms of hands, soles of feet, etc.), Bone Marrow Suppression, Nausea, Diarrhea, Sun Sensitivity and Mouth Sores   EDUCATIONAL MATERIALS GIVEN AND REVIEWED: Chemotherapy and You booklet Specific Instructions Sheets: Xeloda   SELF CARE ACTIVITIES WHILE ON CHEMOTHERAPY: Increase your fluid intake 48 hours prior to treatment and drink at least 64 oz of water/decaff beverages per day. No alcohol intake., No aspirin or other medications unless approved by your oncologist. Eat foods that are light and easy to digest., Eat foods at cold or room temperature., No fried, fatty, or spicy foods immediately before or after treatment., Have teeth cleaned professionally before starting treatment. Keep dentures and partial plates clean., Use soft toothbrush and do not use mouthwashes that contain alcohol. Biotene is a good mouthwash that is available at most pharmacies or may be ordered by calling [phone]., Use warm salt water gargles (1 teaspoon salt per 1 quart warm water) before and after meals and at bedtime. Or you may rinse with 2 tablespoons of three -percent hydrogen peroxide mixed in eight ounces of water., Always use sunscreen with SPF (Sun Protection Factor) of 30 or higher., Use your nausea medication as directed to prevent nausea., Use your stool softener or laxative as directed to prevent constipation. and Use your anti-diarrheal medication as directed to stop diarrhea.  Please wash your hands for at least 30 seconds using warm soapy water. Handwashing is the #1 way to prevent the spread of germs. Stay away from  sick people or people who are getting over a cold. If you develop respiratory systems such as green/yellow mucus production or productive cough or persistent cough let us know and we will see if you need an antibiotic. It is a good idea to keep a pair of gloves on when  going into grocery stores/[employer] to decrease your risk of coming into contact with germs on the carts, etc. Carry alcohol hand gel with you at all times and use it frequently if out in public. All foods need to be cooked thoroughly. No raw foods. No medium or undercooked meats, eggs. If your food is cooked medium well, it does not need to be hot pink or saturated with bloody liquid at all. Vegetables and fruits need to be washed/rinsed under the faucet with a dish detergent before being consumed. You can eat raw fruits and vegetables unless we tell you otherwise but it would be best if you cooked them or bought frozen. Do not eat off of salad bars or hot bars unless you really trust the cleanliness of the restaurant. If you need dental work, please let Dr. Whitney Muse know before you go for your appointment so that we can coordinate the best possible time for you in regards to your chemo regimen. You need to also let your dentist know that you are actively taking chemo. We may need to do labs prior to your dental appointment. We also want your bowels moving at least every other day. If this is not happening, we need to know so that we can get you on a bowel regimen to help you go.     MEDICATIONS: You have been given prescriptions for the following medications:  Xeloda 500mg  tablet. Take 3 tablets in the am after breakfast and 3 tablets in the pm after supper for 7 days in a row followed by a 7 day rest period of taking no Xeloda. (Take within 30 minutes of eating) (Refer to your calendar)  Zofran 4mg  ODT. Take 1 tablet every 8 hours as needed for nausea/vomiting. (#1 nausea med to take, this can constipate)   Over-the-Counter Meds:  Miralax 1 capful in 8 oz of fluid daily. May increase to two times a day if needed. This is a stool softener. If this doesn't work proceed you can add:  Senokot S - start with 1 tablet two times a day and increase to 4 tablets two times a day if needed. (total of 8  tablets in a 24 hour period). This is a stimulant laxative.   Call us if this does not help your bowels move.   Imodium 2mg  capsule. Take 2 capsules after the 1st loose stool and then 1 capsule every 2 hours until you go a total of 12 hours without having a loose stool. Call the [facility] if loose stools continue. If it is bedtime and you have loose stools, take 2 capsules @ bedtime and then 2 capsules every 4 hours until morning. Call [facility].     SYMPTOMS TO REPORT AS SOON AS POSSIBLE AFTER TREATMENT:  FEVER GREATER THAN 100.5 F  CHILLS WITH OR WITHOUT FEVER  NAUSEA AND VOMITING THAT IS NOT CONTROLLED WITH YOUR NAUSEA MEDICATION  UNUSUAL SHORTNESS OF BREATH  UNUSUAL BRUISING OR BLEEDING  TENDERNESS IN MOUTH AND THROAT WITH OR WITHOUT PRESENCE OF ULCERS  URINARY PROBLEMS  BOWEL PROBLEMS  UNUSUAL RASH   Wear comfortable clothing and clothing appropriate for easy access to any Portacath or PICC line. Let us  know if there is anything that we can do to make your therapy better!      I have been informed and understand all of the instructions given to me and have received a copy. I have been instructed to call the clinic [phone] or my family physician as soon as possible for continued medical care, if indicated. I do not have any more questions at this time but understand that I may call the [place] or the Patient Navigator at [phone] during office hours should I have questions or need assistance in obtaining follow-up care.   Capecitabine tablets What is this medicine? CAPECITABINE (ka pe SITE a been) is a chemotherapy drug. It slows the growth of cancer cells. This medicine is used to treat breast cancer, and also colon or rectal cancer. This medicine may be used for other purposes; ask your health care provider or pharmacist if you have questions. What should I tell my health care provider before I take this medicine? They need to know if  you have any of these conditions: -bleeding or blood disorders -dihydropyrimidine dehydrogenase (DPD) deficiency -heart disease -infection (especially a virus infection such as chickenpox, cold sores, or herpes) -kidney disease -liver disease -an unusual or allergic reaction to capecitabine, 5-fluorouracil, other medicines, foods, dyes, or preservatives -pregnant or trying to get pregnant -breast-feeding How should I use this medicine? Take this medicine by mouth with a glass of water, within 30 minutes of the end of a meal. Do not cut, crush or chew this medicine. Follow the directions on the prescription label. Take your medicine at regular intervals. Do not take it more often than directed. Do not stop taking except on your doctor's advice. Your doctor may want you to take a combination of 150 mg and 500 mg tablets for each dose. It is very important that you know how to correctly take your dose. Taking the wrong tablets could result in an overdose (too much medication) or underdose (too little medication). Talk to your pediatrician regarding the use of this medicine in children. Special care may be needed. Overdosage: If you think you have taken too much of this medicine contact a poison control center or emergency room at once. NOTE: This medicine is only for you. Do not share this medicine with others. What if I miss a dose? If you miss a dose, do not take the missed dose at all. Do not take double or extra doses. Instead, continue with your next scheduled dose and check with your doctor. What may interact with this medicine? -antacids with aluminum and/or magnesium -folic acid -leucovorin -medicines to increase blood counts like filgrastim, pegfilgrastim, sargramostim -phenytoin -vaccines -warfarin Talk to your doctor or health care professional before taking any of these medicines: -acetaminophen -aspirin -ibuprofen -ketoprofen -naproxen This list may not describe all possible  interactions. Give your health care provider a list of all the medicines, herbs, non-prescription drugs, or dietary supplements you use. Also tell them if you smoke, drink alcohol, or use illegal drugs. Some items may interact with your medicine. What should I watch for while using this medicine? Visit your doctor for checks on your progress. This drug may make you feel generally unwell. This is not uncommon, as chemotherapy can affect healthy cells as well as cancer cells. Report any side effects. Continue your course of treatment even though you feel ill unless your doctor tells you to stop. In some cases, you may be given additional medicines to help with side  effects. Follow all directions for their use. Call your doctor or health care professional for advice if you get a fever, chills or sore throat, or other symptoms of a cold or flu. Do not treat yourself. This drug decreases your body's ability to fight infections. Try to avoid being around people who are sick. This medicine may increase your risk to bruise or bleed. Call your doctor or health care professional if you notice any unusual bleeding. Be careful brushing and flossing your teeth or using a toothpick because you may get an infection or bleed more easily. If you have any dental work done, tell your dentist you are receiving this medicine. Avoid taking products that contain aspirin, acetaminophen, ibuprofen, naproxen, or ketoprofen unless instructed by your doctor. These medicines may hide a fever. Do not become pregnant while taking this medicine. Women should inform their doctor if they wish to become pregnant or think they might be pregnant. There is a potential for serious side effects to an unborn child. Talk to your health care professional or pharmacist for more information. Do not breast-feed an infant while taking this medicine. Men are advised not to father a child while taking this medicine. What side effects may I notice from  receiving this medicine? Side effects that you should report to your doctor or health care professional as soon as possible: -allergic reactions like skin rash, itching or hives, swelling of the face, lips, or tongue -low blood counts - this medicine may decrease the number of white blood cells, red blood cells and platelets. You may be at increased risk for infections and bleeding. -signs of infection - fever or chills, cough, sore throat, pain or difficulty passing urine -signs of decreased platelets or bleeding - bruising, pinpoint red spots on the skin, black, tarry stools, blood in the urine -signs of decreased red blood cells - unusually weak or tired, fainting spells, lightheadedness -breathing problems -changes in vision -chest pain -dark urine -diarrhea of more than 4 bowel movements in one day or any diarrhea at night; bloody or watery diarrhea -dizziness -mouth sores -nausea and vomiting -pain, tingling, numbness in the hands or feet -redness, swelling, or sores on hands or feet -stomach pain -vomiting -yellow color of skin or eyes Side effects that usually do not require medical attention (report to your doctor or health care professional if they continue or are bothersome): -constipation -diarrhea -dry or itchy skin -hair loss -loss of appetite -nausea -weak or tired This list may not describe all possible side effects. Call your doctor for medical advice about side effects. You may report side effects to FDA at 1-800-FDA-1088. Where should I keep my medicine? Keep out of the reach of children. Store at room temperature between 15 and 30 degrees C (59 and 86 degrees F). Keep container tightly closed. Throw away any unused medicine after the expiration date. NOTE: This sheet is a summary. It may not cover all possible information. If you have questions about this medicine, talk to your doctor, pharmacist, or health care provider.    2016, Elsevier/Gold Standard.  (2014-03-13 17:03:30)

## 2015-01-19 NOTE — Assessment & Plan Note (Addendum)
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  Now with progressive disease on PET imaging.  Oncology history developed.  Staging in [facility] problem list is updated.  Patient educated on her current disease status and review of her treatment options, including Hospice versus Xeloda.  We discussed her Stage IV disease and her prognosis.  She is educated that her disease is not curable and therefore, her diagnosis is terminal.  HOWEVER, she has treatment options.  As a result, we discussed treatment options.  She is interested in trying Xeloda therapy.  I discussed risks, benefits, side effects, and goals of treatment of Xeloda.    I will get her set-up for chemotherapy teaching.  Her calculated Xeloda dose is 1500 mg BID, 7 days on and 7 days off.  Her Rx is printed today and placed in [facility] folder for approval.  I have called Pathology and requested KRAS testing on her July 2016 biopsy of liver lesion to help guide future treatment options.  The patient informed of this and the role of KRAS testing; if wild-type, she would be a candidate for targeted monoclonal antibody treatment.  She continues with issues related to her colitis.  I will refer her back to Dr. Laural Golden for evaluation and management if necessary, particularly since we will be starting Xeloda therapy in the near future.  Code status broached.  No changes at this time.  Future follow-up appointments will be scheduled accordingly following chemotherapy teaching.  She is to return 2 weeks into therapy with labs.

## 2015-01-19 NOTE — Progress Notes (Signed)
Karen Hilding, MD [address] [place] [address]  Colon cancer metastasized to liver [facility]) - Plan: ondansetron (ZOFRAN-ODT) 4 MG disintegrating tablet, capecitabine (XELODA) 500 MG tablet  CURRENT THERAPY: Review of PET scan on 01/18/2015 and planning for future treatment options  INTERVAL HISTORY: Karen [name] 81 y.o. 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 AND Iron deficiency anemia    Colon cancer metastasized to liver [facility])   04/12/1987 Definitive Surgery Dr. Lindalou [name] at [facility]   09/01/2014 Pathology Results Liver, needle/core biopsy - METASTATIC ADENOCARCINOMA.   09/01/2014 Miscellaneous MWA left liver lesion, Karen [name] (IR)   01/18/2015 Progression PET scan   01/18/2015 PET scan Recurrence of  tumor within segment 2 of the liver. There are 2 new lesions identified within segment 5 and segment 6 of the liver worrisome for additional sites of metastasis. Evidence of tumor recurrence at the intra colonic anastomosis is noted...    I personally reviewed and went over laboratory results with the patient.  The results are noted within this dictation.  I personally reviewed and went over radiographic studies with the patient.  The results are noted within this dictation.  PET scan demonstrates progressive disease.    We reviewed this unfortunate information and discussed options.  I reviewed her treatment options which includes Xeloda single-agent or Hospice.  She is a great candidate for therapy given her performance status.  She was not interested in Hospice at this time.  She wants to try treatment.  I discussed the risks, benefits, alternatives, and side effects of Xeloda therapy.  I will get her set-up for formal chemotherapy teaching.   Past Medical History  Diagnosis Date  . Chronic diastolic CHF (congestive heart failure)  (HCC)     a. nl EF by echo 05/2009.  Marland Kitchen DVT of deep femoral vein ([place])   . History of PSVT (paroxysmal supraventricular tachycardia)   . Hypertension   . Abdominal adhesions   . Ileostomy present ([place])   . Colon polyp   . Chest pain     a. 2002 Cath: nl cors;  b. 2009 aden mv: nl;  c. 05/2009 Echo: nl. d. 2014: normal nuclear stress test, EF 69%.  . Wears dentures   . Breast lump   . Hyperlipidemia   . Thyroid disease   . Anemia   . History of blood clots   . Hernia   . Blood transfusion   . Hearing loss   . Arthritis   . GERD (gastroesophageal reflux disease)   . Cancer ([place])     bladder  . Colon cancer ([place])     a. recurrence 2016 with liver mets -  percutaneous liver biopsy and thermal ablation of a liver metastasis by interventional radiology..  . Iron deficiency anemia due to chronic blood loss 07/06/2013  . UTI (urinary tract infection)   . Obstruction of intestine or colon ([place]) 06/2014  . Diabetes mellitus without complication ([place]) 10/13/38    borderline type II; takes no medication for it  . Vision abnormalities 2016    not going blind but having retina problems; might lose ability to see faces  . LBBB (left bundle branch block)   . Asthma     hx of  . Depression   . CKD (chronic kidney disease), stage III     pt.  states decreased kidney function  . Headache   . Fibromyalgia   . Achalasia     a. s/p botox injection for achalasia.  . Sinus bradycardia     a. HR 30s in 08/2014 requiring holding of digoxin.    has Left bundle branch block; Chest pain, exertional; Benign hypertensive heart disease without heart failure; Hypothyroid; Small bowel obstruction, partial ([place]); Dysphagia; Paroxysmal SVT (supraventricular tachycardia) ([place]); Ileostomy status ([place]); Unstable angina ([place]); Parastomal hernia; Iron deficiency anemia due to chronic blood loss; Malabsorption of iron; Acute abdominal pain; Acute renal failure ([place]); Hyperglycemia; UTI (urinary tract infection); Small  bowel obstruction ([place]); Abdominal pain, acute; Liver mass, left lobe; Colon cancer metastasized to liver [facility]); Liver lesion; and Sinus bradycardia on her problem list.     is allergic to bactrim; codeine; demerol; lidocaine hcl; morphine and related; nitrofurantoin monohyd macro; lisinopril; and ramipril.  Current Outpatient Prescriptions on File Prior to Visit  Medication Sig Dispense Refill  . ALPRAZolam (XANAX) 0.5 MG tablet Take 0.5 mg by mouth at bedtime.     Marland Kitchen amitriptyline (ELAVIL) 10 MG tablet Take 20 mg by mouth at bedtime.     Marland Kitchen aspirin EC 325 MG tablet Take 325 mg by mouth daily.    . calcium carbonate (OSCAL) 1500 (600 CA) MG TABS tablet Take by mouth daily with breakfast.    . carvedilol (COREG) 12.5 MG tablet Take 1 tablet (12.5 mg total) by mouth 2 (two) times daily with a meal. 180 tablet 3  . HYDROcodone-acetaminophen (NORCO) 7.5-325 MG tablet     . hyoscyamine (LEVSIN SL) 0.125 MG SL tablet Place 1 tablet (0.125 mg total) under the tongue every 6 (six) hours as needed (lower abdominal pain). 90 tablet 2  . levothyroxine (SYNTHROID, LEVOTHROID) 75 MCG tablet Take 75 mcg by mouth daily.     Marland Kitchen losartan-hydrochlorothiazide (HYZAAR) 100-12.5 MG per tablet Take by mouth as directed. 1/2 TABLET DAILY. DO NOT TAKE IF YOUR TOP BLOOD PRESSURE NUMBER IS LESS THAN 110    . Multiple Vitamins-Minerals (EQL GUMMY ADULT) CHEW Chew 2 tablets by mouth daily.     Marland Kitchen NITROSTAT 0.4 MG SL tablet DISSOLVE ONE TABLET UNDER THE TONGUE EVERY 5 MINUTES AS NEEDED FOR CHEST PAIN.  DO NOT EXCEED A TOTAL OF 3 DOSES IN 15 MINUTES 25 tablet 0  . Omega-3 Fatty Acids (FISH OIL) 1000 MG CAPS Take 1 capsule by mouth daily.    . pantoprazole (PROTONIX) 40 MG tablet Take 40 mg by mouth 2 (two) times daily.     No current facility-administered medications on file prior to visit.    Past Surgical History  Procedure Laterality Date  . Cardiac catheterization  12/10/2000    THE LEFT VENTRICLE IS MILDY DILATED.  THERE IS MILD TO MODERATE DIFFUSE HYPOKINESIS WITH EF 35%  . Colon cancer surgery    . Ileostomy    . Esophagogastroduodenoscopy  02/13/2011    Procedure: ESOPHAGOGASTRODUODENOSCOPY (EGD);  Surgeon: Rogene Houston, MD;  [facility];  Service: Endoscopy;  Laterality: N/A;  1030  . Colonoscopy  09/26/2011    Procedure: COLONOSCOPY;  Surgeon: Rogene Houston, MD;  Location: [facility];  Service: Endoscopy;  Laterality: N/A;  215  . Cataract extraction w/phaco  11/13/2011    Procedure: CATARACT EXTRACTION PHACO AND INTRAOCULAR LENS PLACEMENT (IOC);  Surgeon: Tonny Branch, MD;  Location: [facility] ORS;  Service: Ophthalmology;  Laterality: Right;  CDE 12.26  . Cataract extraction w/phaco  12/08/2011  Procedure: CATARACT EXTRACTION PHACO AND INTRAOCULAR LENS PLACEMENT (IOC);  Surgeon: Tonny Branch, MD;  Location: [facility] ORS;  Service: Ophthalmology;  Laterality: Left;  CDE 15.11  . Esophagogastroduodenoscopy N/A 09/01/2013    Procedure: ESOPHAGOGASTRODUODENOSCOPY (EGD);  Surgeon: Rogene Houston, MD;  Location: [facility];  Service: Endoscopy;  Laterality: N/A;  830  . Botox injection N/A 09/01/2013    Procedure: BOTOX INJECTION;  Surgeon: Rogene Houston, MD;  Location: [facility];  Service: Endoscopy;  Laterality: N/A;  . Abdominal hysterectomy    . Cholecystectomy    . Joint replacement Left 2008  . Tonsillectomy    . Colon surgery    . Eye surgery  2014    catarac surgery  . Appendectomy    . Ovarian cystectomy 1955    . Ileostomy    . Coronary angioplasty    . Rotator cuff repair    . Breast surgery      right breast biopsy  . Liver biopsy and ablation  09/01/14    Denies any headaches, dizziness, double vision, fevers, chills, night sweats, nausea, vomiting, chest pain, heart palpitations, shortness of breath, urinary pain, urinary burning, urinary frequency, hematuria.   PHYSICAL EXAMINATION  ECOG PERFORMANCE STATUS: 1 - Symptomatic but completely ambulatory  Filed Vitals:    01/19/15 1300  BP: 144/74  Pulse: 75  Temp: 98.1 F (36.7 C)  Resp: 18    GENERAL:alert, no distress, well nourished, comfortable, cooperative and accompanied by her daughter. SKIN: skin color, texture, turgor are normal, no rashes or significant lesions HEAD: Normocephalic, No masses, lesions, tenderness or abnormalities EYES: normal, PERRLA, Conjunctiva are pink and non-injected EARS: External ears normal OROPHARYNX:lips, buccal mucosa, and tongue normal and mucous membranes are moist  NECK: supple, trachea midline LYMPH:  not examined BREAST:not examined LUNGS: not examined HEART: not examined ABDOMEN:not examined BACK: Back symmetric, no curvature. EXTREMITIES:less then 2 second capillary refill, no skin discoloration, no cyanosis  NEURO: alert & oriented x 3 with fluent speech, no focal motor/sensory deficits   LABORATORY DATA: CBC    Component Value Date/Time   WBC 5.0 01/11/2015 1430   RBC 3.99 01/11/2015 1430   RBC 3.45* 11/24/2008 1500   HGB 11.6* 01/11/2015 1430   HCT 35.6* 01/11/2015 1430   PLT 175 01/11/2015 1430   MCV 89.2 01/11/2015 1430   MCH 29.1 01/11/2015 1430   MCHC 32.6 01/11/2015 1430   RDW 12.6 01/11/2015 1430   LYMPHSABS 1.1 01/11/2015 1430   MONOABS 0.5 01/11/2015 1430   EOSABS 0.2 01/11/2015 1430   BASOSABS 0.0 01/11/2015 1430      Chemistry      Component Value Date/Time   NA 140 01/11/2015 1430   K 3.7 01/11/2015 1430   CL 104 01/11/2015 1430   CO2 27 01/11/2015 1430   BUN 20 01/11/2015 1430   CREATININE 1.03* 01/11/2015 1430   CREATININE 0.95* 12/07/2014 1512      Component Value Date/Time   CALCIUM 9.2 01/11/2015 1430   ALKPHOS 63 01/11/2015 1430   AST 18 01/11/2015 1430   ALT 12* 01/11/2015 1430   BILITOT 0.7 01/11/2015 1430     Lab Results  Component Value Date   CEA 41.6* 01/11/2015    PENDING LABS:   RADIOGRAPHIC STUDIES:  Nm Pet Image Initial (pi) Skull Base To Thigh  01/18/2015  CLINICAL DATA:  Subsequent  treatment strategy for colon cancer. EXAM: NUCLEAR MEDICINE PET SKULL BASE TO THIGH TECHNIQUE: 13.29 mCi F-18 FDG was injected intravenously.  Full-ring PET imaging was performed from the skull base to thigh after the radiotracer. CT data was obtained and used for attenuation correction and anatomic localization. FASTING BLOOD GLUCOSE:  Value: 87 mg/dl COMPARISON:  09/09/2014 and 07/13/2014 FINDINGS: NECK No hypermetabolic lymph nodes identified within the soft tissues of the neck. CHEST The heart size is enlarged. No hypermetabolic mediastinal or hilar lymph nodes. No pericardial effusion noted. Aortic atherosclerosis noted. No pleural fluid identified. No hyper metabolic pulmonary nodules identified. ABDOMEN/PELVIS There is abnormal FDG accumulation within the segment 2 lesion. Residual hypermetabolic tumor measures 1.6 cm and has an SUV max equal to 12.14. Within segment 5 of the liver there is a hypermetabolic lesion which measures approximately 1.5 cm and has an SUV max equal to 6.25. Within segment 6 of the liver there is a lesion which measures approximately 1.8 cm and has an SUV max equal to 5.2. Within the left upper quadrant of the abdomen at the level of the intra colonic anastomosis there is an enlarging and hypermetabolic mass along the suture line. This measures 3.3 cm and has an SUV max equal to 21. Within the left lower quadrant of the abdomen there is a mass associated with the proximal sigmoid colon which measures 3.1 cm and has an SUV max equal to 23.5. Finally, within the mid sigmoid colon there is a focus of intense radiotracer uptake measuring approximately 2.6 cm with an SUV max equal to 19.9. Also suspicious for tumor. SKELETON No focal hypermetabolic activity to suggest skeletal metastasis. IMPRESSION: 1. Examination is positive for recurrence of hypermetabolic tumor within segment 2 of the liver. Additionally, there are 2 new lesions identified within segment 5 and segment 6 of the liver  worrisome for additional sites of metastasis. 2. Evidence of tumor recurrence at the intra colonic anastomosis is noted within the left upper quadrant of the abdomen. Additional foci of hypermetabolic tumor is identified within the proximal sigmoid colon as well as the mid sigmoid colon. 3. Aortic atherosclerosis Electronically Signed   By: Kerby Moors M.D.   On: 01/18/2015 14:31     PATHOLOGY:    ASSESSMENT AND PLAN:  Colon cancer metastasized to liver ([place]) Stage IV CRC with single liver lesion metastasis treated with microwave ablation by IR in July 2016, after early stage CRC in 1989.  Now with progressive disease on PET imaging.  Oncology history developed.  Staging in [facility] problem list is updated.  Patient educated on her current disease status and review of her treatment options, including Hospice versus Xeloda.  We discussed her Stage IV disease and her prognosis.  She is educated that her disease is not curable and therefore, her diagnosis is terminal.  HOWEVER, she has treatment options.  As a result, we discussed treatment options.  She is interested in trying Xeloda therapy.  I discussed risks, benefits, side effects, and goals of treatment of Xeloda.    I will get her set-up for chemotherapy teaching.  Her calculated Xeloda dose is 1500 mg BID, 7 days on and 7 days off.  Her Rx is printed today and placed in [facility] folder for approval.  I have called Pathology and requested KRAS testing on her July 2016 biopsy of liver lesion to help guide future treatment options.  The patient informed of this and the role of KRAS testing; if wild-type, she would be a candidate for targeted monoclonal antibody treatment.  She continues with issues related to her colitis.  I will refer her back to  Dr. Laural Golden for evaluation and management if necessary, particularly since we will be starting Xeloda therapy in the near future.  Code status broached.  No changes at this time.  Future  follow-up appointments will be scheduled accordingly following chemotherapy teaching.  She is to return 2 weeks into therapy with labs.    THERAPY PLAN:  Will start Xeloda therapy in the near future following chemotherapy teaching.  All questions were answered. The patient knows to call the clinic with any problems, questions or concerns. We can certainly see the patient much sooner if necessary.  Patient and plan discussed with Dr. Ancil Linsey and she is in agreement with the aforementioned.   This note is electronically signed by: Doy Mince 01/19/2015 5:06 PM

## 2015-01-22 ENCOUNTER — Encounter (HOSPITAL_COMMUNITY): Payer: Medicare Other

## 2015-01-22 ENCOUNTER — Other Ambulatory Visit (HOSPITAL_COMMUNITY): Payer: Self-pay | Admitting: Oncology

## 2015-01-22 DIAGNOSIS — D500 Iron deficiency anemia secondary to blood loss (chronic): Secondary | ICD-10-CM

## 2015-01-22 NOTE — Progress Notes (Unsigned)
Chemo teaching done and consent signed for Xeloda. Prescription to be faxed into [employer] [employer] [facility] by Durene Cal today. Patient has Zofran 4mg  ODT at home for usage if needed. 2 of patient's daughters were present for teaching and teaching materials were also given to them.

## 2015-01-24 DIAGNOSIS — D3132 Benign neoplasm of left choroid: Secondary | ICD-10-CM | POA: Diagnosis not present

## 2015-01-24 DIAGNOSIS — H26493 Other secondary cataract, bilateral: Secondary | ICD-10-CM | POA: Diagnosis not present

## 2015-01-24 DIAGNOSIS — H35342 Macular cyst, hole, or pseudohole, left eye: Secondary | ICD-10-CM | POA: Diagnosis not present

## 2015-01-24 DIAGNOSIS — H35073 Retinal telangiectasis, bilateral: Secondary | ICD-10-CM | POA: Diagnosis not present

## 2015-01-25 ENCOUNTER — Encounter (HOSPITAL_BASED_OUTPATIENT_CLINIC_OR_DEPARTMENT_OTHER): Payer: Medicare Other

## 2015-01-25 ENCOUNTER — Encounter (HOSPITAL_COMMUNITY): Payer: Self-pay

## 2015-01-25 VITALS — BP 137/54 | HR 67 | Temp 98.0°F | Resp 16

## 2015-01-25 DIAGNOSIS — D500 Iron deficiency anemia secondary to blood loss (chronic): Secondary | ICD-10-CM

## 2015-01-25 MED ORDER — SODIUM CHLORIDE 0.9 % IV SOLN
125.0000 mg | Freq: Once | INTRAVENOUS | Status: AC
  Administered 2015-01-25: 125 mg via INTRAVENOUS
  Filled 2015-01-25: qty 10

## 2015-01-25 MED ORDER — SODIUM CHLORIDE 0.9 % IV SOLN
INTRAVENOUS | Status: DC
  Administered 2015-01-25: 12:00:00 via INTRAVENOUS

## 2015-01-25 MED ORDER — SODIUM CHLORIDE 0.9 % IJ SOLN
10.0000 mL | Freq: Once | INTRAMUSCULAR | Status: AC
  Administered 2015-01-25: 10 mL via INTRAVENOUS

## 2015-01-25 NOTE — Patient Instructions (Signed)
[place] at [facility] Discharge Instructions  RECOMMENDATIONS MADE BY THE CONSULTANT AND ANY TEST RESULTS WILL BE SENT TO YOUR REFERRING PHYSICIAN.  IV Ferric Gluconate today.    Thank you for choosing [place] at [facility] to provide your oncology and hematology care.  To afford each patient quality time with our provider, please arrive at least 15 minutes before your scheduled appointment time.    You need to re-schedule your appointment should you arrive 10 or more minutes late.  We strive to give you quality time with our providers, and arriving late affects you and other patients whose appointments are after yours.  Also, if you no show three or more times for appointments you may be dismissed from the clinic at the providers discretion.     Again, thank you for choosing [facility].  Our hope is that these requests will decrease the amount of time that you wait before being seen by our physicians.       _____________________________________________________________  Should you have questions after your visit to [facility], please contact our office at [phone] between the hours of 8:30 a.m. and 4:30 p.m.  Voicemails left after 4:30 p.m. will not be returned until the following business day.  For prescription refill requests, have your pharmacy contact our office.

## 2015-01-26 ENCOUNTER — Telehealth (HOSPITAL_COMMUNITY): Payer: Self-pay | Admitting: *Deleted

## 2015-01-26 NOTE — Telephone Encounter (Signed)
KRAS testing should be back by early next week per Pathology Department.

## 2015-01-29 ENCOUNTER — Encounter (HOSPITAL_COMMUNITY): Payer: Self-pay

## 2015-01-30 ENCOUNTER — Ambulatory Visit (INDEPENDENT_AMBULATORY_CARE_PROVIDER_SITE_OTHER): Payer: Medicare Other | Admitting: Internal Medicine

## 2015-01-30 ENCOUNTER — Encounter (INDEPENDENT_AMBULATORY_CARE_PROVIDER_SITE_OTHER): Payer: Self-pay | Admitting: Internal Medicine

## 2015-01-30 VITALS — BP 130/72 | HR 70 | Temp 98.6°F | Resp 18 | Ht 60.0 in | Wt 144.0 lb

## 2015-01-30 DIAGNOSIS — C787 Secondary malignant neoplasm of liver and intrahepatic bile duct: Secondary | ICD-10-CM

## 2015-01-30 DIAGNOSIS — C189 Malignant neoplasm of colon, unspecified: Secondary | ICD-10-CM

## 2015-01-30 DIAGNOSIS — K5289 Other specified noninfective gastroenteritis and colitis: Secondary | ICD-10-CM

## 2015-01-30 MED ORDER — HYDROCORTISONE 100 MG/60ML RE ENEM: 1.0000 | ENEMA | Freq: Every day | RECTAL | Status: DC

## 2015-01-30 NOTE — Patient Instructions (Addendum)
Will check for availability of short chain fatty acid and emesis. Progress report in two weeks.

## 2015-01-30 NOTE — Progress Notes (Signed)
Presenting complaint;  Lower abdominal pain and rectal bleeding.  Database and Subjective:  Patient is 81 year old Caucasian female who is here for scheduled visit accompanied by her daughter. She was last seen on 09/11/2014. She has been diagnosed with metastatic colon carcinoma. She had single liver lesion ablated back in July 2016. Now she has multiple liver lesions and she also has what appears to be a mass and proximal sigmoid colon or descending colon. She was begun on Xeloda 3 days ago. She continues to have intermittent sharp hypogastric pain as well as bleeding. 2 days ago she passed a moderate amount blood per rectum and she still passing some stool and mucus. She denies melena or bleeding into ileostomy. She also complains of generalized abdominal soreness and feels parastomal hernia is getting larger. Her appetite is fair and she has not lost any weight since her last visit. In fact she has gained 4 pounds. 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 She also complains of back pain radiating into her right flank. She denies dysuria or hematuria or vaginal bleeding. She is receiving Feraheme intermittently. Her hemoglobin on 01/11/2015 was 11.6.   Current Medications: Outpatient Encounter Prescriptions as of 01/30/2015  Medication Sig  . ALPRAZolam (XANAX) 0.5 MG tablet Take 0.5 mg by mouth at bedtime.   Marland Kitchen amitriptyline (ELAVIL) 10 MG tablet Take 20 mg by mouth at bedtime.   Marland Kitchen aspirin EC 325 MG tablet Take 325 mg by mouth daily.  . capecitabine (XELODA) 500 MG tablet Take 3 tablets (1,500 mg total) by mouth 2 (two) times daily after a meal. Take 7 days on and 7 days off  . carvedilol (COREG) 12.5 MG tablet Take 1 tablet (12.5 mg total) by mouth 2 (two) times daily with a meal.  . HYDROcodone-acetaminophen (NORCO) 7.5-325 MG tablet Take 1 tablet by mouth every 4  (four) hours as needed.   . hyoscyamine (LEVSIN SL) 0.125 MG SL tablet Place 1 tablet (0.125 mg total) under the tongue every 6 (six) hours as needed (lower abdominal pain).  Marland Kitchen levothyroxine (SYNTHROID, LEVOTHROID) 75 MCG tablet Take 75 mcg by mouth daily.   Marland Kitchen losartan-hydrochlorothiazide (HYZAAR) 100-12.5 MG per tablet Take by mouth as directed. 1/2 TABLET DAILY. DO NOT TAKE IF YOUR TOP BLOOD PRESSURE NUMBER IS LESS THAN 110  . NITROSTAT 0.4 MG SL tablet DISSOLVE ONE TABLET UNDER THE TONGUE EVERY 5 MINUTES AS NEEDED FOR CHEST PAIN.  DO NOT EXCEED A TOTAL OF 3 DOSES IN 15 MINUTES  . ondansetron (ZOFRAN-ODT) 4 MG disintegrating tablet Take 1 tablet (4 mg total) by mouth every 8 (eight) hours as needed for nausea or vomiting.  . pantoprazole (PROTONIX) 40 MG tablet Take 40 mg by mouth daily.   . calcium carbonate (OSCAL) 1500 (600 CA) MG TABS tablet Take by mouth daily with breakfast. Reported on 01/30/2015  . Multiple Vitamins-Minerals (EQL GUMMY ADULT) CHEW Chew 2 tablets by mouth daily. Reported on 01/30/2015  . Omega-3 Fatty Acids (FISH OIL) 1000 MG CAPS Take 1 capsule by mouth daily. Reported on 01/30/2015   No facility-administered encounter medications on file as of 01/30/2015.     Objective: Blood pressure 130/72, pulse 70, temperature 98.6 F (37 C), temperature source Oral, resp. rate 18, height 5' (1.524 m), weight 144 lb (65.318 kg). Patient is alert and in no acute distress. Conjunctiva is pink. Sclera is nonicteric Oropharyngeal mucosa is normal.  No neck masses or thyromegaly noted. Cardiac exam with regular rhythm normal S1 and S2. No murmur or gallop noted. Lungs are clear to auscultation. Abdomen abdomen is asymmetrical with ileostomy in right low quadrant of her abdomen containing greenish brown stool there is parastomal hernia. Abdomen is soft with mild generalized tenderness but no organomegaly or masses. No LE edema or clubbing noted.  Labs/studies Results: CEA was 41.6  on 01/11/2015    Assessment:  #1. Divergent colitis. She did not respond to mesalamine suppositories. SCFA [place] of choice but may be too costly. Will try her on hydrocortisone enemas for 2 weeks. If she does not respond to topical steroids will consider SCFA and enemas. #2. Metastatic colon carcinoma. She appears to have third primary possibly in proximal part of excluded lower GI tract which appears to be either proximal sigmoid colon or descending colon. Remaining colon has become atrophic and fixated and never could be examined endoscopically. Evaluation of long excluded segment was undertaken with CT with rectal contrast as well as virtual colonoscopy and now she has developed metastatic disease.   Plan:  Hydrocortisone enemas 100 mg PR daily at bedtime for 2 weeks. Her daughter instructed to give her half the dose for the first 2-3 days and see if she can hold it. Will call her pharmacy and find out if short chain fasces acid enema prescription can be compounded since there is no commercially available preparation. Patient will call with progress report in 2 weeks.

## 2015-02-06 ENCOUNTER — Encounter (HOSPITAL_COMMUNITY): Payer: Self-pay | Admitting: Hematology & Oncology

## 2015-02-06 ENCOUNTER — Encounter (HOSPITAL_BASED_OUTPATIENT_CLINIC_OR_DEPARTMENT_OTHER): Payer: Medicare Other | Admitting: Hematology & Oncology

## 2015-02-06 VITALS — BP 124/50 | HR 79 | Temp 98.6°F | Resp 16 | Wt 144.9 lb

## 2015-02-06 DIAGNOSIS — C189 Malignant neoplasm of colon, unspecified: Secondary | ICD-10-CM

## 2015-02-06 DIAGNOSIS — C787 Secondary malignant neoplasm of liver and intrahepatic bile duct: Secondary | ICD-10-CM | POA: Diagnosis not present

## 2015-02-06 NOTE — Progress Notes (Signed)
Karen Hilding, MD [place] / [place] [address]   DIAGNOSIS: Colon carcinoma with permanent ostomy Diversion Colitis Achalasia Iron deficiency, anemia secondary to GI related blood loss Hemeoccult positive stools Rising CEA with CT imaging on 07/13/2014 1.3 cm mass in L hepatic lobe Subcapsular mass in anterior pole of L kidney suspicious for RCC Cutaneous microwave ablation of her now biopsy-proven metastatic colonic adenocarcinoma to the left liver 09/01/2014   Colon cancer metastasized to liver [facility])   04/12/1987 Definitive Surgery Dr. Lindalou Hose at [facility]   09/01/2014 Pathology Results Liver, needle/core biopsy - METASTATIC ADENOCARCINOMA.   09/01/2014 Miscellaneous MWA left liver lesion, [name] (IR)   01/18/2015 Progression PET scan   01/18/2015 PET scan Recurrence of  tumor within segment 2 of the liver. There are 2 new lesions identified within segment 5 and segment 6 of the liver worrisome for additional sites of metastasis. Evidence of tumor recurrence at the intra colonic anastomosis is noted...   01/29/2015 Pathology Results KRAS wild-type   CURRENT THERAPY:  XELODA IV iron prn  History of Present Illness: Karen [name] returns today for further follow-up of stage IV CRC s/p microwave ablation of a solitary liver lesion. She also has a L renal lesion that is under observation. She has other health issues including chronic LBP.  Karen [name] is here with her husband and daughter today. She presents in a wheelchair. She states the only things that remind her she is taking chemotherapy is her dry mouth.  Her hands are dry but she washes them frequently. She denies hand soreness or tenderness. Denies soreness or redness on her feet. She does have pain in her feet due to two ingrown toenails that she is taking antibiotics for. She follows with a podiatrist. She has been soaking her feet in both Epsom salt and plain salt baths. When she uses an enema, she outputs  bloody water.She has seen Dr. Laural Golden for her colitis.  Reports her mother and oldest brother had bladder cancer. Reports her oldest brother also had colon cancer. Her oldest brother died at 13 years of age, not of cancer. She believes her mother may have also had colon cancer as she had a colostomy. Her mother died of a stroke. Her oldest sister died at 65 years of age. Her youngest sister had mini strokes and then a massive stroke resulting in Alzheimer-like symptoms living in a nursing home.   The patient states she previously received genetic testing in [place]. Her next appointment with Dr. Laural Golden is scheduled for 03/20/15.   MEDICAL HISTORY: Past Medical History  Diagnosis Date  . Chronic diastolic CHF (congestive heart failure) (HCC)     a. nl EF by echo 05/2009.  Marland Kitchen DVT of deep femoral vein ([place])   . History of PSVT (paroxysmal supraventricular tachycardia)   . Hypertension   . Abdominal adhesions   . Ileostomy present ([place])   . Colon polyp   . Chest pain     a. 2002 Cath: nl cors;  b. 2009 aden mv: nl;  c. 05/2009 Echo: nl. d. 2014: normal nuclear stress test, EF 69%.  . Wears dentures   . Breast lump   . Hyperlipidemia   . Thyroid disease   . Anemia   . History of blood clots   . Hernia   . Blood transfusion   . Hearing loss   . Arthritis   . GERD (gastroesophageal reflux disease)   . Cancer ([place])  bladder  . Colon cancer ([place])     a. recurrence 2016 with liver mets -  percutaneous liver biopsy and thermal ablation of a liver metastasis by interventional radiology..  . Iron deficiency anemia due to chronic blood loss 07/06/2013  . UTI (urinary tract infection)   . Obstruction of intestine or colon ([place]) 06/2014  . Diabetes mellitus without complication ([place]) 07/18/35    borderline type II; takes no medication for it  . Vision abnormalities 2016    not going blind but having retina problems; might lose ability to see faces  . LBBB (left bundle branch block)   . Asthma      hx of  . Depression   . CKD (chronic kidney disease), stage III     pt. states decreased kidney function  . Headache   . Fibromyalgia   . Achalasia     a. s/p botox injection for achalasia.  . Sinus bradycardia     a. HR 30s in 08/2014 requiring holding of digoxin.    has Left bundle branch block; Chest pain, exertional; Benign hypertensive heart disease without heart failure; Hypothyroid; Small bowel obstruction, partial ([place]); Dysphagia; Paroxysmal SVT (supraventricular tachycardia) ([place]); Ileostomy status ([place]); Unstable angina ([place]); Parastomal hernia; Iron deficiency anemia due to chronic blood loss; Malabsorption of iron; Acute abdominal pain; Acute renal failure ([place]); Hyperglycemia; UTI (urinary tract infection); Small bowel obstruction ([place]); Abdominal pain, acute; Liver mass, left lobe; Colon cancer metastasized to liver [facility]); Liver lesion; and Sinus bradycardia on her problem list.     is allergic to bactrim; codeine; demerol; lidocaine hcl; morphine and related; dilaudid; nitrofurantoin monohyd macro; lisinopril; and ramipril.   Iron Infusion in March 2016. Mammogram performed in 2015. Reported multiple hernias.   SURGICAL HISTORY: Past Surgical History  Procedure Laterality Date  . Cardiac catheterization  12/10/2000    THE LEFT VENTRICLE IS MILDY DILATED. THERE IS MILD TO MODERATE DIFFUSE HYPOKINESIS WITH EF 35%  . Colon cancer surgery    . Ileostomy    . Esophagogastroduodenoscopy  02/13/2011    Procedure: ESOPHAGOGASTRODUODENOSCOPY (EGD);  Surgeon: Rogene Houston, MD;  [facility];  Service: Endoscopy;  Laterality: N/A;  1030  . Colonoscopy  09/26/2011    Procedure: COLONOSCOPY;  Surgeon: Rogene Houston, MD;  [facility];  Service: Endoscopy;  Laterality: N/A;  215  . Cataract extraction w/phaco  11/13/2011    Procedure: CATARACT EXTRACTION PHACO AND INTRAOCULAR LENS PLACEMENT (IOC);  Surgeon: Tonny Branch, MD;  Location: [facility] ORS;  Service:  Ophthalmology;  Laterality: Right;  CDE 12.26  . Cataract extraction w/phaco  12/08/2011    Procedure: CATARACT EXTRACTION PHACO AND INTRAOCULAR LENS PLACEMENT (IOC);  Surgeon: Tonny Branch, MD;  Location: [facility];  Service: Ophthalmology;  Laterality: Left;  CDE 15.11  . Esophagogastroduodenoscopy N/A 09/01/2013    Procedure: ESOPHAGOGASTRODUODENOSCOPY (EGD);  Surgeon: Rogene Houston, MD;  [facility];  Service: Endoscopy;  Laterality: N/A;  830  . Botox injection N/A 09/01/2013    Procedure: BOTOX INJECTION;  Surgeon: Rogene Houston, MD;  [facility]: [facility];  Service: Endoscopy;  Laterality: N/A;  . Abdominal hysterectomy    . Cholecystectomy    . Joint replacement Left 2008  . Tonsillectomy    . Colon surgery    . Eye surgery  2014    catarac surgery  . Appendectomy    . Ovarian cystectomy 1955    . Ileostomy    . Coronary angioplasty    .  Rotator cuff repair    . Breast surgery      right breast biopsy  . Liver biopsy and ablation  09/01/14    SOCIAL HISTORY: Social History   Social History  . Marital Status: Married    Spouse Name: N/A  . Number of Children: N/A  . Years of Education: N/A   Occupational History  . Not on file.   Social History Main Topics  . Smoking status: Former Smoker    Quit date: 02/11/1963  . Smokeless tobacco: Never Used  . Alcohol Use: No  . Drug Use: No  . Sexual Activity: Not on file   Other Topics Concern  . Not on file   Social History Narrative   Reads inspirational books   FAMILY HISTORY: Family History  Problem Relation Age of Onset  . Stroke    . Hypertension    . Heart disease Mother   . Stroke Mother     deceased  . Cancer Mother     bladder  . Arthritis Mother   . Arthritis Father   . Heart disease Father     decesaed  . Cancer Father     leukemia  . Stroke Sister     alive/debilitated  . Hypertension Sister   . Other Sister     paralysis  . Heart disease Brother     bypass surgery  .  Cancer Brother   . Arthritis Brother   . Stroke Sister     alive/debilitated  . Diabetes Sister   . Other Brother     stomach problems  . Other Brother     bladder    Review of Systems  Constitutional: Negative for fever, chills, weight loss and malaise/fatigue.  HENT: Positive for hearing loss. Negative for congestion, nosebleeds, sore throat and tinnitus.   Eyes: Negative for blurred vision, double vision, pain and discharge.  Respiratory: Negative for cough, hemoptysis, sputum production, shortness of breath and wheezing.   Cardiovascular: Negative for chest pain, palpitations, claudication, leg swelling and PND.  Gastrointestinal:  Negative for heartburn, abdominal pain, diarrhea, constipation, blood in stool and melena. ALTERED TASTE Genitourinary: Negative for dysuria, urgency, frequency and hematuria.  Musculoskeletal: Positive for joint pain. Negative for myalgias and falls.  Skin: Negative for itching and rash.  Neurological: Positive for weakness. Negative for dizziness, tingling, tremors, sensory change, speech change, focal weakness, seizures, loss of consciousness and headaches.  Endo/Heme/Allergies: Does not bruise/bleed easily.  Psychiatric/Behavioral: Negative for depression, suicidal ideas, memory loss and substance abuse. The patient is not nervous/anxious and does not have insomnia.   14 point review of systems was performed and is negative except as detailed under history of present illness and above  PHYSICAL EXAMINATION  ECOG PERFORMANCE STATUS: 1 - Symptomatic but completely ambulatory  Filed Vitals:   02/06/15 1327  BP: 124/50  Pulse: 79  Temp: 98.6 F (37 C)  Resp: 16    Physical Exam  Constitutional: She is oriented to person, place, and time and well-developed, well-nourished, and in no distress. In wheelchair. HENT:  Head: Normocephalic and atraumatic. R ear with small deformity from prior surgery Nose: Nose normal.  Mouth/Throat: Oropharynx is  clear and moist. No oropharyngeal exudate.  Eyes: Conjunctivae and EOM are normal. Pupils are equal, round, and reactive to light. Right eye exhibits no discharge. Left eye exhibits no discharge. No scleral icterus.  Neck: Normal range of motion. Neck supple. No tracheal deviation present. No thyromegaly present.  Cardiovascular: Normal rate, regular  rhythm and normal heart sounds.  Exam reveals no gallop and no friction rub.   No murmur heard. Pulmonary/Chest: Effort normal and breath sounds normal. She has no wheezes. She has no rales.  Abdominal: Soft. Bowel sounds are normal. . There is no tenderness. There is no rebound and no guarding. ostomy site is intact with watery dark stool. Hernia noted. Incision sites all WNL Musculoskeletal: Normal range of motion. She exhibits no edema.  Lymphadenopathy:    She has no cervical adenopathy.  Neurological: She is alert and oriented to person, place, and time. She has normal reflexes. No cranial nerve deficit.  Skin: Skin is warm and dry. No rash noted.  Psychiatric: Mood, memory, affect and judgment normal.  Nursing note and vitals reviewed.    LABORATORY DATA: I have reviewed the data as listed. CBC    Component Value Date/Time   WBC 5.0 01/11/2015 1430   RBC 3.99 01/11/2015 1430   RBC 3.45* 11/24/2008 1500   HGB 11.6* 01/11/2015 1430   HCT 35.6* 01/11/2015 1430   PLT 175 01/11/2015 1430   MCV 89.2 01/11/2015 1430   MCH 29.1 01/11/2015 1430   MCHC 32.6 01/11/2015 1430   RDW 12.6 01/11/2015 1430   LYMPHSABS 1.1 01/11/2015 1430   MONOABS 0.5 01/11/2015 1430   EOSABS 0.2 01/11/2015 1430   BASOSABS 0.0 01/11/2015 1430   CMP     Component Value Date/Time   NA 140 01/11/2015 1430   K 3.7 01/11/2015 1430   CL 104 01/11/2015 1430   CO2 27 01/11/2015 1430   GLUCOSE 90 01/11/2015 1430   BUN 20 01/11/2015 1430   CREATININE 1.03* 01/11/2015 1430   CREATININE 0.95* 12/07/2014 1512   CALCIUM 9.2 01/11/2015 1430   PROT 7.0 01/11/2015  1430   ALBUMIN 3.6 01/11/2015 1430   AST 18 01/11/2015 1430   ALT 12* 01/11/2015 1430   ALKPHOS 63 01/11/2015 1430   BILITOT 0.7 01/11/2015 1430   GFRNONAA 49* 01/11/2015 1430   GFRNONAA 56* 12/07/2014 1512   GFRAA 57* 01/11/2015 1430   GFRAA 64 12/07/2014 1512   Results for [place], SYANN CUPPLES (MRN [id number])   Ref. Range 12/30/2013 11:06 06/30/2014 11:20 10/19/2014 10:45 12/07/2014 15:12 01/11/2015 14:30  CEA Latest Ref Range: 0.0-4.7 ng/mL 6.1 (H) 35.5 (H) 25.3 (H) 12.0 (H) 41.6 (H)     RADIOLOGY: I have reviewed the images detailed below and agree with the results: CLINICAL DATA: Subsequent treatment strategy for colon cancer.  EXAM: NUCLEAR MEDICINE PET SKULL BASE TO THIGH  TECHNIQUE: 13.29 mCi F-18 FDG was injected intravenously. Full-ring PET imaging was performed from the skull base to thigh after the radiotracer. CT data was obtained and used for attenuation correction and anatomic localization.  FASTING BLOOD GLUCOSE: Value: 87 mg/dl  COMPARISON: 09/09/2014 and 07/13/2014  FINDINGS: NECK  No hypermetabolic lymph nodes identified within the soft tissues of the neck.  CHEST  The heart size is enlarged. No hypermetabolic mediastinal or hilar lymph nodes. No pericardial effusion noted. Aortic atherosclerosis noted.  No pleural fluid identified. No hyper metabolic pulmonary nodules identified.  ABDOMEN/PELVIS  There is abnormal FDG accumulation within the segment 2 lesion. Residual hypermetabolic tumor measures 1.6 cm and has an SUV max equal to 12.14. Within segment 5 of the liver there is a hypermetabolic lesion which measures approximately 1.5 cm and has an SUV max equal to 6.25. Within segment 6 of the liver there is a lesion which measures approximately 1.8 cm and has an SUV max equal  to 5.2. Within the left upper quadrant of the abdomen at the level of the intra colonic anastomosis there is an enlarging and hypermetabolic mass along the  suture line. This measures 3.3 cm and has an SUV max equal to 21. Within the left lower quadrant of the abdomen there is a mass associated with the proximal sigmoid colon which measures 3.1 cm and has an SUV max equal to 23.5. Finally, within the mid sigmoid colon there is a focus of intense radiotracer uptake measuring approximately 2.6 cm with an SUV max equal to 19.9. Also suspicious for tumor.  SKELETON  No focal hypermetabolic activity to suggest skeletal metastasis.  IMPRESSION: 1. Examination is positive for recurrence of hypermetabolic tumor within segment 2 of the liver. Additionally, there are 2 new lesions identified within segment 5 and segment 6 of the liver worrisome for additional sites of metastasis. 2. Evidence of tumor recurrence at the intra colonic anastomosis is noted within the left upper quadrant of the abdomen. Additional foci of hypermetabolic tumor is identified within the proximal sigmoid colon as well as the mid sigmoid colon. 3. Aortic atherosclerosis   Electronically Signed  By: Kerby Moors M.D.  On: 01/18/2015 14:31  ASSESSMENT and THERAPY PLAN:  Colon carcinoma with permanent ostomy Diversion Colitis Achalasia Iron deficiency, anemia secondary to GI related blood loss Hemeoccult positive stools Rising CEA with CT imaging on 07/13/2014 1.3 cm mass in L hepatic lobe Subcapsular mass in anterior pole of L kidney suspicious for RCC Biopsy of L liver lesion and MWA Left liver lesion 09/01/2014 Taste Alteration KRAS WT XELODA therapy, week on, week off started 01/2015  The patient talked to [facility] this morning. Side effects of concern with XELODA therapy were again reviewed; hand/foot syndrome, increased output in her ostomy, worsening GERD.  We discussed her KRAS wild type result.  The patient was given power of attorney information today.  I am going to look for her genetics testing.    Mrs. Hobdy will return in 2 weeks for routine  follow up, PE and labs including a ferritin.  All questions were answered. The patient knows to call the clinic with any problems, questions or concerns. We can certainly see the patient much sooner if necessary.   This document was electronically signed.   This document serves as a record of services personally performed by Ancil Linsey, MD. It was created on her behalf by Arlyce Harman, a trained medical scribe. The creation of this record is based on the scribe's personal observations and the provider's statements to them. This document has been checked and approved by the attending provider.  I have reviewed the above documentation for accuracy and completeness, and I agree with the above.  Kelby Fam. Whitney Muse, MD

## 2015-02-06 NOTE — Patient Instructions (Addendum)
[place] at [facility] Discharge Instructions  RECOMMENDATIONS MADE BY THE CONSULTANT AND ANY TEST RESULTS WILL BE SENT TO YOUR REFERRING PHYSICIAN.   Exam completed by Dr Whitney Muse today We may get you scheduled for possible genetic testing, they come here one day a month Please call us if your hands or feet get tender or red (this is know as hand/foot syndrome)  In a couple weeks if the CEA number is dropping we will talk about adding avastin, this is given IV.   Continue taking Xeloda as prescribed.  7 days on and 7 days off  Keep your hands moisturized  You dont need to wear your mask  Return to see the doctor in 2 weeks with lab work  Please call the clinic if you have any questions or concerns     Thank you for choosing [place] at [facility] to provide your oncology and hematology care.  To afford each patient quality time with our provider, please arrive at least 15 minutes before your scheduled appointment time.    You need to re-schedule your appointment should you arrive 10 or more minutes late.  We strive to give you quality time with our providers, and arriving late affects you and other patients whose appointments are after yours.  Also, if you no show three or more times for appointments you may be dismissed from the clinic at the providers discretion.     Again, thank you for choosing [facility].  Our hope is that these requests will decrease the amount of time that you wait before being seen by our physicians.       _____________________________________________________________  Should you have questions after your visit to [facility], please contact our office at [phone] between the hours of 8:30 a.m. and 4:30 p.m.  Voicemails left after 4:30 p.m. will not be returned until the following business day.  For prescription refill requests, have your pharmacy contact our office.         Bevacizumab injection What is this medicine? BEVACIZUMAB (be va SIZ yoo mab) is a monoclonal antibody. It is used to treat cervical cancer, colorectal cancer, glioblastoma multiforme, non-small cell lung cancer (NSCLC), ovarian cancer, and renal cell cancer. This medicine may be used for other purposes; ask your health care provider or pharmacist if you have questions. What should I tell my health care provider before I take this medicine? They need to know if you have any of these conditions: -blood clots -heart disease, including heart failure, heart attack, or chest pain (angina) -high blood pressure -infection (especially a virus infection such as chickenpox, cold sores, or herpes) -kidney disease -lung disease -prior chemotherapy with doxorubicin, daunorubicin, epirubicin, or other anthracycline type chemotherapy agents -recent or ongoing radiation therapy -recent surgery -stroke -an unusual or allergic reaction to bevacizumab, hamster proteins, mouse proteins, other medicines, foods, dyes, or preservatives -pregnant or trying to get pregnant -breast-feeding How should I use this medicine? This medicine is for infusion into a vein. It is given by a health care professional in a hospital or clinic setting. Talk to your pediatrician regarding the use of this medicine in children. Special care may be needed. Overdosage: If you think you have taken too much of this medicine contact a poison control center or emergency room at once. NOTE: This medicine is only for you. Do not share this medicine with others. What if I miss a dose? It  is important not to miss your dose. Call your doctor or health care professional if you are unable to keep an appointment. What may interact with this medicine? Interactions are not expected. This list may not describe all possible interactions. Give your health care provider a list of all the medicines, herbs, non-prescription drugs, or dietary  supplements you use. Also tell them if you smoke, drink alcohol, or use illegal drugs. Some items may interact with your medicine. What should I watch for while using this medicine? Your condition will be monitored carefully while you are receiving this medicine. You will need important blood work and urine testing done while you are taking this medicine. During your treatment, let your health care professional know if you have any unusual symptoms, such as difficulty breathing. This medicine may rarely cause 'gastrointestinal perforation' (holes in the stomach, intestines or colon), a serious side effect requiring surgery to repair. This medicine should be started at least 28 days following major surgery and the site of the surgery should be totally healed. Check with your doctor before scheduling dental work or surgery while you are receiving this treatment. Talk to your doctor if you have recently had surgery or if you have a wound that has not healed. Do not become pregnant while taking this medicine or for 6 months after stopping it. Women should inform their doctor if they wish to become pregnant or think they might be pregnant. There is a potential for serious side effects to an unborn child. Talk to your health care professional or pharmacist for more information. Do not breast-feed an infant while taking this medicine. This medicine has caused ovarian failure in some women. This medicine may interfere with the ability to have a child. You should talk to your doctor or health care professional if you are concerned about your fertility. What side effects may I notice from receiving this medicine? Side effects that you should report to your doctor or health care professional as soon as possible: -allergic reactions like skin rash, itching or hives, swelling of the face, lips, or tongue -signs of infection - fever or chills, cough, sore throat, pain or trouble passing urine -signs of decreased  platelets or bleeding - bruising, pinpoint red spots on the skin, black, tarry stools, nosebleeds, blood in the urine -breathing problems -changes in vision -chest pain -confusion -jaw pain, especially after dental work -mouth sores -seizures -severe abdominal pain -severe headache -sudden numbness or weakness of the face, arm or leg -swelling of legs or ankles -symptoms of a stroke: change in mental awareness, inability to talk or move one side of the body (especially in patients with lung cancer) -trouble passing urine or change in the amount of urine -trouble speaking or understanding -trouble walking, dizziness, loss of balance or coordination Side effects that usually do not require medical attention (report to your doctor or health care professional if they continue or are bothersome): -constipation -diarrhea -dry skin -headache -loss of appetite -nausea, vomiting This list may not describe all possible side effects. Call your doctor for medical advice about side effects. You may report side effects to FDA at 1-800-FDA-1088. Where should I keep my medicine? This drug is given in a hospital or clinic and will not be stored at home. NOTE: This sheet is a summary. It may not cover all possible information. If you have questions about this medicine, talk to your doctor, pharmacist, or health care provider.    2016, [employer]vier/[employer] Standard. (2014-03-28 16:58:44)

## 2015-02-07 ENCOUNTER — Encounter (HOSPITAL_COMMUNITY): Payer: Medicare Other

## 2015-02-08 DIAGNOSIS — H53031 Strabismic amblyopia, right eye: Secondary | ICD-10-CM | POA: Diagnosis not present

## 2015-02-08 DIAGNOSIS — H35073 Retinal telangiectasis, bilateral: Secondary | ICD-10-CM | POA: Diagnosis not present

## 2015-02-08 DIAGNOSIS — H5010 Unspecified exotropia: Secondary | ICD-10-CM | POA: Diagnosis not present

## 2015-02-08 DIAGNOSIS — H26492 Other secondary cataract, left eye: Secondary | ICD-10-CM | POA: Diagnosis not present

## 2015-02-08 DIAGNOSIS — H26493 Other secondary cataract, bilateral: Secondary | ICD-10-CM | POA: Diagnosis not present

## 2015-02-15 ENCOUNTER — Other Ambulatory Visit (HOSPITAL_COMMUNITY): Payer: Self-pay | Admitting: *Deleted

## 2015-02-15 DIAGNOSIS — C189 Malignant neoplasm of colon, unspecified: Secondary | ICD-10-CM

## 2015-02-15 DIAGNOSIS — C787 Secondary malignant neoplasm of liver and intrahepatic bile duct: Principal | ICD-10-CM

## 2015-02-15 MED ORDER — AMOXICILLIN-POT CLAVULANATE 875-125 MG PO TABS: 1.0000 | ORAL_TABLET | Freq: Two times a day (BID) | ORAL | Status: DC

## 2015-02-17 NOTE — Assessment & Plan Note (Addendum)
Stage IV CRC, KRAS wild-type, S/P microwave ablation of a solitary liver lesion on 09/01/2014.  Currently on palliative Xeloda 1500 mg BID 7 days on and 7 days off beginning 01/2015.  Oncology history updated.  Labs today: CBC diff, CMET, Ferritin  Labs in 2 weeks: CBC diff, CMET, CEA  She is welcome to continue her multivitamin, calcium, and fish oil while on Xeloda.  She has a sinusitis on the left.  She is in the midst of Augmentin treatment.  She will call us on Wednesday/Thursday if not better.   If patient continues to tolerate Xeloda, and biochemical response is identified, the addition of Avastin should be considered and recommended.  I briefly discussed this treatment option and the risks, benefits, alternatives, and side effects.  She will need chemotherapy teaching if this intervention is pursued.    Return in 2 weeks for follow-up.

## 2015-02-17 NOTE — Progress Notes (Signed)
Karen Hilding, MD [address] [place] [address]  Colon cancer metastasized to liver [facility]) - Plan: CBC with Differential, Comprehensive metabolic panel, CEA  CURRENT THERAPY: Xeloda 1500 mg BID 7 days on and 7 days off.  INTERVAL HISTORY: Karen [name] 82 y.o. female returns for followup of Stage IV CRC, KRAS wild-type, S/P microwave ablation of a solitary liver lesion on 09/01/2014.  Additionally, she has a left renal lesion that is continued to be observed along with other medical issues including LBP.    Colon cancer metastasized to liver [facility])   04/12/1987 Definitive Surgery Dr. Lindalou Hose at [facility]   09/01/2014 Pathology Results Liver, needle/core biopsy - METASTATIC ADENOCARCINOMA.   09/01/2014 Miscellaneous MWA left liver lesion, [name] (IR)   01/18/2015 Progression PET scan   01/18/2015 PET scan Recurrence of  tumor within segment 2 of the liver. There are 2 new lesions identified within segment 5 and segment 6 of the liver worrisome for additional sites of metastasis. Evidence of tumor recurrence at the intra colonic anastomosis is noted...   01/28/2015 -  Chemotherapy Xeloda 1500 mg BID 7 days on and 7 days off beginning in December 2016.   01/29/2015 Pathology Results KRAS wild-type    I personally reviewed and went over laboratory results with the patient.  The results are noted within this dictation. Labs will be updated today.  She is tolerating Xeloda without any difficulty.  She denies any nausea/vomiting.  She denies any PN, pain, discomfort, erythema, sores/lesions, or cracking of skin of palms or soles.  She denies any constipation or diarrhea.  "I don't even know I am taking it (regarding Xeloda).  Past Medical History  Diagnosis Date  . Chronic diastolic CHF (congestive heart failure) (HCC)     a. nl EF by echo 05/2009.  Marland Kitchen DVT of deep femoral vein ([place])   . History of PSVT (paroxysmal supraventricular tachycardia)   . Hypertension     . Abdominal adhesions   . Ileostomy present ([place])   . Colon polyp   . Chest pain     a. 2002 Cath: nl cors;  b. 2009 aden mv: nl;  c. 05/2009 Echo: nl. d. 2014: normal nuclear stress test, EF 69%.  . Wears dentures   . Breast lump   . Hyperlipidemia   . Thyroid disease   . Anemia   . History of blood clots   . Hernia   . Blood transfusion   . Hearing loss   . Arthritis   . GERD (gastroesophageal reflux disease)   . Cancer ([place])     bladder  . Colon cancer ([place])     a. recurrence 2016 with liver mets -  percutaneous liver biopsy and thermal ablation of a liver metastasis by interventional radiology..  . Iron deficiency anemia due to chronic blood loss 07/06/2013  . UTI (urinary tract infection)   . Obstruction of intestine or colon ([place]) 06/2014  . Diabetes mellitus without complication ([place]) 07/14/52    borderline type II; takes no medication for it  . Vision abnormalities 2016    not going blind but having retina problems; might lose ability to see faces  . LBBB (left bundle branch block)   . Asthma     hx of  . Depression   . CKD (chronic kidney disease), stage III     pt. states decreased kidney function  . Headache   . Fibromyalgia   . Achalasia  a. s/p botox injection for achalasia.  . Sinus bradycardia     a. HR 30s in 08/2014 requiring holding of digoxin.    has Left bundle branch block; Chest pain, exertional; Benign hypertensive heart disease without heart failure; Hypothyroid; Small bowel obstruction, partial ([place]); Dysphagia; Paroxysmal SVT (supraventricular tachycardia) ([place]); Ileostomy status ([place]); Unstable angina ([place]); Parastomal hernia; Iron deficiency anemia due to chronic blood loss; Malabsorption of iron; Acute abdominal pain; Acute renal failure ([place]); Hyperglycemia; UTI (urinary tract infection); Small bowel obstruction ([place]); Abdominal pain, acute; Liver mass, left lobe; Colon cancer metastasized to liver [facility]); Liver lesion; and Sinus bradycardia on  her problem list.     is allergic to bactrim; codeine; demerol; lidocaine hcl; morphine and related; dilaudid; nitrofurantoin monohyd macro; lisinopril; and ramipril.  Current Outpatient Prescriptions on File Prior to Visit  Medication Sig Dispense Refill  . ALPRAZolam (XANAX) 0.5 MG tablet Take 0.5 mg by mouth at bedtime.     Marland Kitchen amitriptyline (ELAVIL) 10 MG tablet Take 20 mg by mouth at bedtime.     Marland Kitchen amoxicillin-clavulanate (AUGMENTIN) 875-125 MG tablet Take 1 tablet by mouth 2 (two) times daily. 14 tablet 0  . aspirin EC 325 MG tablet Take 325 mg by mouth daily.    . carvedilol (COREG) 12.5 MG tablet Take 1 tablet (12.5 mg total) by mouth 2 (two) times daily with a meal. 180 tablet 3  . HYDROcodone-acetaminophen (NORCO) 7.5-325 MG tablet Take 1 tablet by mouth every 4 (four) hours as needed.     . hyoscyamine (LEVSIN SL) 0.125 MG SL tablet Place 1 tablet (0.125 mg total) under the tongue every 6 (six) hours as needed (lower abdominal pain). 90 tablet 2  . levothyroxine (SYNTHROID, LEVOTHROID) 75 MCG tablet Take 75 mcg by mouth daily.     Marland Kitchen losartan-hydrochlorothiazide (HYZAAR) 100-12.5 MG per tablet Take by mouth as directed. 1/2 TABLET DAILY. DO NOT TAKE IF YOUR TOP BLOOD PRESSURE NUMBER IS LESS THAN 110    . Multiple Vitamins-Minerals (EQL GUMMY ADULT) CHEW Chew 2 tablets by mouth daily. Reported on 02/06/2015    . ondansetron (ZOFRAN-ODT) 4 MG disintegrating tablet Take 1 tablet (4 mg total) by mouth every 8 (eight) hours as needed for nausea or vomiting. 45 tablet 1  . pantoprazole (PROTONIX) 40 MG tablet Take 40 mg by mouth daily.     . calcium carbonate (OSCAL) 1500 (600 CA) MG TABS tablet Take by mouth daily with breakfast. Reported on 02/19/2015    . capecitabine (XELODA) 500 MG tablet Take 3 tablets (1,500 mg total) by mouth 2 (two) times daily after a meal. Take 7 days on and 7 days off (Patient not taking: Reported on 02/06/2015) 84 tablet 1  . cephALEXin (KEFLEX) 500 MG capsule Take  500 mg by mouth 3 (three) times daily. Reported on 02/19/2015    . hydrocortisone (CORTENEMA) 100 MG/60ML enema Place 1 enema (100 mg total) rectally at bedtime. (Patient not taking: Reported on 02/19/2015) 14 enema 0  . NITROSTAT 0.4 MG SL tablet DISSOLVE ONE TABLET UNDER THE TONGUE EVERY 5 MINUTES AS NEEDED FOR CHEST PAIN.  DO NOT EXCEED A TOTAL OF 3 DOSES IN 15 MINUTES (Patient not taking: Reported on 02/19/2015) 25 tablet 0  . Omega-3 Fatty Acids (FISH OIL) 1000 MG CAPS Take 1 capsule by mouth daily. Reported on 02/19/2015     No current facility-administered medications on file prior to visit.    Past Surgical History  Procedure Laterality Date  . Cardiac catheterization  12/10/2000    THE LEFT VENTRICLE IS MILDY DILATED. THERE IS MILD TO MODERATE DIFFUSE HYPOKINESIS WITH EF 35%  . Colon cancer surgery    . Ileostomy    . Esophagogastroduodenoscopy  02/13/2011    Procedure: ESOPHAGOGASTRODUODENOSCOPY (EGD);  Surgeon: Rogene Houston, MD;  [facility];  Service: Endoscopy;  Laterality: N/A;  1030  . Colonoscopy  09/26/2011    Procedure: COLONOSCOPY;  Surgeon: Rogene Houston, MD;  [facility];  Service: Endoscopy;  Laterality: N/A;  215  . Cataract extraction w/phaco  11/13/2011    Procedure: CATARACT EXTRACTION PHACO AND INTRAOCULAR LENS PLACEMENT (IOC);  Surgeon: Tonny Branch, MD;  Location: [facility] ORS;  Service: Ophthalmology;  Laterality: Right;  CDE 12.26  . Cataract extraction w/phaco  12/08/2011    Procedure: CATARACT EXTRACTION PHACO AND INTRAOCULAR LENS PLACEMENT (IOC);  Surgeon: Tonny Branch, MD;  Location: [facility] ORS;  Service: Ophthalmology;  Laterality: Left;  CDE 15.11  . Esophagogastroduodenoscopy N/A 09/01/2013    Procedure: ESOPHAGOGASTRODUODENOSCOPY (EGD);  Surgeon: Rogene Houston, MD;  [facility];  Service: Endoscopy;  Laterality: N/A;  830  . Botox injection N/A 09/01/2013    Procedure: BOTOX INJECTION;  Surgeon: Rogene Houston, MD;  Location: [facility];  Service: Endoscopy;  Laterality: N/A;  . Abdominal hysterectomy    . Cholecystectomy    . Joint replacement Left 2008  . Tonsillectomy    . Colon surgery    . Eye surgery  2014    catarac surgery  . Appendectomy    . Ovarian cystectomy 1955    . Ileostomy    . Coronary angioplasty    . Rotator cuff repair    . Breast surgery      right breast biopsy  . Liver biopsy and ablation  09/01/14    Denies any headaches, dizziness, double vision, fevers, chills, night sweats, nausea, vomiting, diarrhea, constipation, chest pain, heart palpitations, shortness of breath, blood in stool, black tarry stool, urinary pain, urinary burning, urinary frequency, hematuria.   PHYSICAL EXAMINATION  ECOG PERFORMANCE STATUS: 1 - Symptomatic but completely ambulatory  Filed Vitals:   02/19/15 1403  BP: 128/61  Pulse: 70  Temp: 98.6 F (37 C)  Resp: 16    GENERAL:alert, no distress, well nourished, well developed, comfortable, cooperative and accompanied by her husband and daughter. SKIN: skin color, texture, turgor are normal, no rashes or significant lesions HEAD: Normocephalic, No masses, lesions, tenderness or abnormalities, mild erythema on left inferior turbinate with edema/swelling, increased tenderness on palpation of left maxillary sinusus EYES: normal, EOMI, Conjunctiva are pink and non-injected EARS: External ears normal OROPHARYNX:lips, buccal mucosa, and tongue normal and mucous membranes are moist  NECK: supple, no adenopathy, thyroid normal size, non-tender, without nodularity, trachea midline LYMPH:  no palpable lymphadenopathy BREAST:not examined LUNGS: clear to auscultation and percussion HEART: regular rate & rhythm, no murmurs, no gallops, S1 normal and S2 normal ABDOMEN:abdomen soft, non-tender and normal bowel sounds BACK: Back symmetric, no curvature., No CVA tenderness EXTREMITIES:less then 2 second capillary refill, no joint deformities, effusion, or inflammation,  no skin discoloration, no clubbing, no cyanosis  NEURO: alert & oriented x 3 with fluent speech, no focal motor/sensory deficits, gait normal, flat affect   LABORATORY DATA: CBC    Component Value Date/Time   WBC 4.8 02/19/2015 1342   RBC 3.78* 02/19/2015 1342   RBC 3.45* 11/24/2008 1500   HGB 11.5* 02/19/2015 1342   HCT 34.4* 02/19/2015 1342  PLT 149* 02/19/2015 1342   MCV 91.0 02/19/2015 1342   MCH 30.4 02/19/2015 1342   MCHC 33.4 02/19/2015 1342   RDW 13.8 02/19/2015 1342   LYMPHSABS 1.1 02/19/2015 1342   MONOABS 0.6 02/19/2015 1342   EOSABS 0.1 02/19/2015 1342   BASOSABS 0.0 02/19/2015 1342      Chemistry      Component Value Date/Time   NA 141 02/19/2015 1342   K 3.9 02/19/2015 1342   CL 103 02/19/2015 1342   CO2 29 02/19/2015 1342   BUN 18 02/19/2015 1342   CREATININE 1.10* 02/19/2015 1342   CREATININE 0.95* 12/07/2014 1512      Component Value Date/Time   CALCIUM 9.3 02/19/2015 1342   ALKPHOS 64 02/19/2015 1342   AST 19 02/19/2015 1342   ALT 11* 02/19/2015 1342   BILITOT 0.7 02/19/2015 1342        PENDING LABS:   RADIOGRAPHIC STUDIES:  No results found.   PATHOLOGY:    ASSESSMENT AND PLAN:  Colon cancer metastasized to liver (HCC) Stage IV CRC, KRAS wild-type, S/P microwave ablation of a solitary liver lesion on 09/01/2014.  Currently on palliative Xeloda 1500 mg BID 7 days on and 7 days off beginning 01/2015.  Oncology history updated.  Labs today: CBC diff, CMET, Ferritin  Labs in 2 weeks: CBC diff, CMET, CEA  She is welcome to continue her multivitamin, calcium, and fish oil while on Xeloda.  She has a sinusitis on the left.  She is in the midst of Augmentin treatment.  She will call us on Wednesday/Thursday if not better.   If patient continues to tolerate Xeloda, and biochemical response is identified, the addition of Avastin should be considered and recommended.  I briefly discussed this treatment option and the risks, benefits,  alternatives, and side effects.  She will need chemotherapy teaching if this intervention is pursued.    Return in 2 weeks for follow-up.    THERAPY PLAN:  Continue Xeloda.  Should consider the addition of Avastin in the near future.  All questions were answered. The patient knows to call the clinic with any problems, questions or concerns. We can certainly see the patient much sooner if necessary.  Patient and plan discussed with Dr. Ancil Linsey and she is in agreement with the aforementioned.   This note is electronically signed by: Doy Mince 02/19/2015 5:06 PM

## 2015-02-19 ENCOUNTER — Encounter (HOSPITAL_COMMUNITY): Payer: Medicare Other | Attending: Hematology and Oncology | Admitting: Oncology

## 2015-02-19 ENCOUNTER — Encounter (HOSPITAL_COMMUNITY): Payer: Self-pay | Admitting: Oncology

## 2015-02-19 ENCOUNTER — Encounter (HOSPITAL_COMMUNITY): Payer: Medicare Other

## 2015-02-19 VITALS — BP 128/61 | HR 70 | Temp 98.6°F | Resp 16 | Wt 148.0 lb

## 2015-02-19 DIAGNOSIS — C787 Secondary malignant neoplasm of liver and intrahepatic bile duct: Secondary | ICD-10-CM | POA: Diagnosis not present

## 2015-02-19 DIAGNOSIS — C189 Malignant neoplasm of colon, unspecified: Secondary | ICD-10-CM | POA: Insufficient documentation

## 2015-02-19 DIAGNOSIS — D509 Iron deficiency anemia, unspecified: Secondary | ICD-10-CM | POA: Insufficient documentation

## 2015-02-19 LAB — COMPREHENSIVE METABOLIC PANEL
ALBUMIN: 3.8 g/dL (ref 3.5–5.0)
ALK PHOS: 64 U/L (ref 38–126)
ALT: 11 U/L — ABNORMAL LOW (ref 14–54)
AST: 19 U/L (ref 15–41)
Anion gap: 9 (ref 5–15)
BILIRUBIN TOTAL: 0.7 mg/dL (ref 0.3–1.2)
BUN: 18 mg/dL (ref 6–20)
CALCIUM: 9.3 mg/dL (ref 8.9–10.3)
CO2: 29 mmol/L (ref 22–32)
Chloride: 103 mmol/L (ref 101–111)
Creatinine, Ser: 1.1 mg/dL — ABNORMAL HIGH (ref 0.44–1.00)
GFR calc Af Amer: 52 mL/min — ABNORMAL LOW (ref >60)
GFR calc non Af Amer: 45 mL/min — ABNORMAL LOW (ref >60)
Glucose, Bld: 106 mg/dL — ABNORMAL HIGH (ref 65–99)
POTASSIUM: 3.9 mmol/L (ref 3.5–5.1)
SODIUM: 141 mmol/L (ref 135–145)
TOTAL PROTEIN: 7 g/dL (ref 6.5–8.1)

## 2015-02-19 LAB — CBC WITH DIFFERENTIAL/PLATELET
BASOS ABS: 0 10*3/uL (ref 0.0–0.1)
BASOS PCT: 0 %
Eosinophils Absolute: 0.1 10*3/uL (ref 0.0–0.7)
Eosinophils Relative: 3 %
HEMATOCRIT: 34.4 % — AB (ref 36.0–46.0)
HEMOGLOBIN: 11.5 g/dL — AB (ref 12.0–15.0)
Lymphocytes Relative: 22 %
Lymphs Abs: 1.1 10*3/uL (ref 0.7–4.0)
MCH: 30.4 pg (ref 26.0–34.0)
MCHC: 33.4 g/dL (ref 30.0–36.0)
MCV: 91 fL (ref 78.0–100.0)
Monocytes Absolute: 0.6 10*3/uL (ref 0.1–1.0)
Monocytes Relative: 12 %
NEUTROS ABS: 3 10*3/uL (ref 1.7–7.7)
NEUTROS PCT: 63 %
Platelets: 149 10*3/uL — ABNORMAL LOW (ref 150–400)
RBC: 3.78 MIL/uL — ABNORMAL LOW (ref 3.87–5.11)
RDW: 13.8 % (ref 11.5–15.5)
WBC: 4.8 10*3/uL (ref 4.0–10.5)

## 2015-02-19 LAB — FERRITIN: FERRITIN: 99 ng/mL (ref 11–307)

## 2015-02-19 NOTE — Patient Instructions (Signed)
..  [place] at [facility] Discharge Instructions  RECOMMENDATIONS MADE BY THE CONSULTANT AND ANY TEST RESULTS WILL BE SENT TO YOUR REFERRING PHYSICIAN.  Yes you may take fish oil, vitamins, etc... Complete augmentin rx Call Hildred Alamin on Wednesday/Thursday if sinuses not improved Return in 2 weeks for labs and f/u   Thank you for choosing [place] at [facility] to provide your oncology and hematology care.  To afford each patient quality time with our provider, please arrive at least 15 minutes before your scheduled appointment time.    You need to re-schedule your appointment should you arrive 10 or more minutes late.  We strive to give you quality time with our providers, and arriving late affects you and other patients whose appointments are after yours.  Also, if you no show three or more times for appointments you may be dismissed from the clinic at the providers discretion.     Again, thank you for choosing [facility].  Our hope is that these requests will decrease the amount of time that you wait before being seen by our physicians.       _____________________________________________________________  Should you have questions after your visit to [facility], please contact our office at [phone] between the hours of 8:30 a.m. and 4:30 p.m.  Voicemails left after 4:30 p.m. will not be returned until the following business day.  For prescription refill requests, have your pharmacy contact our office.

## 2015-02-20 DIAGNOSIS — E119 Type 2 diabetes mellitus without complications: Secondary | ICD-10-CM | POA: Diagnosis not present

## 2015-02-20 DIAGNOSIS — F411 Generalized anxiety disorder: Secondary | ICD-10-CM | POA: Diagnosis not present

## 2015-02-20 DIAGNOSIS — Z8505 Personal history of malignant neoplasm of liver: Secondary | ICD-10-CM | POA: Diagnosis not present

## 2015-02-20 DIAGNOSIS — I10 Essential (primary) hypertension: Secondary | ICD-10-CM | POA: Diagnosis not present

## 2015-02-20 DIAGNOSIS — M545 Low back pain: Secondary | ICD-10-CM | POA: Diagnosis not present

## 2015-02-20 DIAGNOSIS — E039 Hypothyroidism, unspecified: Secondary | ICD-10-CM | POA: Diagnosis not present

## 2015-02-20 DIAGNOSIS — M797 Fibromyalgia: Secondary | ICD-10-CM | POA: Diagnosis not present

## 2015-02-23 ENCOUNTER — Other Ambulatory Visit (HOSPITAL_COMMUNITY): Payer: Self-pay | Admitting: *Deleted

## 2015-02-23 DIAGNOSIS — C189 Malignant neoplasm of colon, unspecified: Secondary | ICD-10-CM

## 2015-02-23 DIAGNOSIS — C787 Secondary malignant neoplasm of liver and intrahepatic bile duct: Principal | ICD-10-CM

## 2015-02-23 MED ORDER — AMOXICILLIN-POT CLAVULANATE 600-42.9 MG/5ML PO SUSR: 600.0000 mg | Freq: Two times a day (BID) | ORAL | Status: DC

## 2015-03-02 ENCOUNTER — Telehealth (HOSPITAL_COMMUNITY): Payer: Self-pay | Admitting: *Deleted

## 2015-03-02 ENCOUNTER — Other Ambulatory Visit (HOSPITAL_COMMUNITY): Payer: Self-pay | Admitting: *Deleted

## 2015-03-02 DIAGNOSIS — C189 Malignant neoplasm of colon, unspecified: Secondary | ICD-10-CM

## 2015-03-02 DIAGNOSIS — C787 Secondary malignant neoplasm of liver and intrahepatic bile duct: Principal | ICD-10-CM

## 2015-03-02 MED ORDER — FLUCONAZOLE 100 MG PO TABS: 100.0000 mg | ORAL_TABLET | Freq: Every day | ORAL | Status: DC

## 2015-03-02 NOTE — Telephone Encounter (Signed)
Patient called to give update on her sinus infection after taking Augmentin an additional 7 days. Patient's sinus pressure in face and green/yellow mucus production has resolved. She states that now her mucus is clear and sinus pressure is gone. She did have a headache a couple of days ago and she states that her blood sugar was up @ 154 and that was high for her. She attributes the headache to her blood sugar level. Patient has had clear mucus production x 2 days now. Nose not running like it was. The groin areas has gotten raw however. Pt applied desitin to the area. On one side of her abdomen (left side) it was raw under the skin fold - used Desitin for that also. Had a one time episode of abdominal pain under breasts/upper abdomen that lasted approximately 3 hours. She took a hycosamine and ate lunch but it continued to hurt. Pain felt like sharp cutting pains. First time she has had pain up this high. She has had pain in lower abdomen before. Patient took a hydrocodone @ 1450 and the pain has eased up some & states that it is dull (#3). Pt states that she looks like she is swollen in abdominal area. Pt put on panty hose but they felt like they were too tight around her abdomen. So she removed them. Pt is currently taking Xeloda and finishes up tomorrow. Pt returns to see [place] on 03/07/15.

## 2015-03-02 NOTE — Telephone Encounter (Signed)
Dr. Whitney Muse didn't think the Xeloda was causing upper abdominal pain. She prescribed Diflucan x 10 days for her raw areas. I instructed pt to continue taking her Hydrocodone for her stomach pain and to contact me on Monday if her pain was not improved. I also instructed her to go to the ER if it got worse. She said ok.

## 2015-03-06 ENCOUNTER — Other Ambulatory Visit (HOSPITAL_COMMUNITY): Payer: Self-pay | Admitting: Interventional Radiology

## 2015-03-06 DIAGNOSIS — C189 Malignant neoplasm of colon, unspecified: Secondary | ICD-10-CM

## 2015-03-06 DIAGNOSIS — C787 Secondary malignant neoplasm of liver and intrahepatic bile duct: Principal | ICD-10-CM

## 2015-03-07 ENCOUNTER — Other Ambulatory Visit (HOSPITAL_COMMUNITY)
Admission: RE | Admit: 2015-03-07 | Discharge: 2015-03-07 | Disposition: A | Discharge status: Home / Self Care | Payer: Medicare Other | Source: Ambulatory Visit | Attending: Family Medicine | Admitting: Family Medicine

## 2015-03-07 ENCOUNTER — Other Ambulatory Visit (HOSPITAL_COMMUNITY): Payer: Self-pay | Admitting: Emergency Medicine

## 2015-03-07 ENCOUNTER — Encounter (HOSPITAL_COMMUNITY): Payer: Medicare Other

## 2015-03-07 ENCOUNTER — Encounter (HOSPITAL_COMMUNITY): Payer: Self-pay | Admitting: Hematology & Oncology

## 2015-03-07 ENCOUNTER — Encounter (HOSPITAL_BASED_OUTPATIENT_CLINIC_OR_DEPARTMENT_OTHER): Payer: Medicare Other | Admitting: Hematology & Oncology

## 2015-03-07 VITALS — BP 127/50 | HR 72 | Temp 98.4°F | Resp 16 | Wt 148.0 lb

## 2015-03-07 DIAGNOSIS — C787 Secondary malignant neoplasm of liver and intrahepatic bile duct: Principal | ICD-10-CM

## 2015-03-07 DIAGNOSIS — C189 Malignant neoplasm of colon, unspecified: Secondary | ICD-10-CM | POA: Diagnosis not present

## 2015-03-07 DIAGNOSIS — M797 Fibromyalgia: Secondary | ICD-10-CM | POA: Insufficient documentation

## 2015-03-07 DIAGNOSIS — I10 Essential (primary) hypertension: Secondary | ICD-10-CM | POA: Insufficient documentation

## 2015-03-07 DIAGNOSIS — E119 Type 2 diabetes mellitus without complications: Secondary | ICD-10-CM | POA: Insufficient documentation

## 2015-03-07 DIAGNOSIS — E039 Hypothyroidism, unspecified: Secondary | ICD-10-CM | POA: Diagnosis not present

## 2015-03-07 DIAGNOSIS — D509 Iron deficiency anemia, unspecified: Secondary | ICD-10-CM | POA: Diagnosis not present

## 2015-03-07 LAB — TSH: TSH: 4.561 u[IU]/mL — AB (ref 0.350–4.500)

## 2015-03-07 LAB — COMPREHENSIVE METABOLIC PANEL
ALK PHOS: 75 U/L (ref 38–126)
ALT: 14 U/L (ref 14–54)
ANION GAP: 8 (ref 5–15)
AST: 21 U/L (ref 15–41)
Albumin: 3.9 g/dL (ref 3.5–5.0)
BILIRUBIN TOTAL: 0.5 mg/dL (ref 0.3–1.2)
BUN: 23 mg/dL — ABNORMAL HIGH (ref 6–20)
CALCIUM: 9.4 mg/dL (ref 8.9–10.3)
CO2: 28 mmol/L (ref 22–32)
CREATININE: 1.16 mg/dL — AB (ref 0.44–1.00)
Chloride: 105 mmol/L (ref 101–111)
GFR calc non Af Amer: 42 mL/min — ABNORMAL LOW (ref >60)
GFR, EST AFRICAN AMERICAN: 49 mL/min — AB (ref >60)
Glucose, Bld: 102 mg/dL — ABNORMAL HIGH (ref 65–99)
Potassium: 3.9 mmol/L (ref 3.5–5.1)
Sodium: 141 mmol/L (ref 135–145)
TOTAL PROTEIN: 7 g/dL (ref 6.5–8.1)

## 2015-03-07 LAB — CBC WITH DIFFERENTIAL/PLATELET
Basophils Absolute: 0 10*3/uL (ref 0.0–0.1)
Basophils Relative: 1 %
Eosinophils Absolute: 0.2 10*3/uL (ref 0.0–0.7)
Eosinophils Relative: 3 %
HEMATOCRIT: 34.7 % — AB (ref 36.0–46.0)
HEMOGLOBIN: 11.4 g/dL — AB (ref 12.0–15.0)
LYMPHS ABS: 1.4 10*3/uL (ref 0.7–4.0)
LYMPHS PCT: 33 %
MCH: 30.6 pg (ref 26.0–34.0)
MCHC: 32.9 g/dL (ref 30.0–36.0)
MCV: 93 fL (ref 78.0–100.0)
MONOS PCT: 13 %
Monocytes Absolute: 0.6 10*3/uL (ref 0.1–1.0)
NEUTROS ABS: 2.2 10*3/uL (ref 1.7–7.7)
NEUTROS PCT: 50 %
Platelets: 164 10*3/uL (ref 150–400)
RBC: 3.73 MIL/uL — AB (ref 3.87–5.11)
RDW: 15.9 % — ABNORMAL HIGH (ref 11.5–15.5)
WBC: 4.4 10*3/uL (ref 4.0–10.5)

## 2015-03-07 LAB — LIPID PANEL
Cholesterol: 176 mg/dL (ref 0–200)
HDL: 61 mg/dL (ref >40)
LDL CALC: 82 mg/dL (ref 0–99)
TRIGLYCERIDES: 167 mg/dL — AB (ref <150)
Total CHOL/HDL Ratio: 2.9 RATIO
VLDL: 33 mg/dL (ref 0–40)

## 2015-03-07 MED ORDER — NYSTATIN 100000 UNIT/GM EX POWD: 1.0000 | Freq: Two times a day (BID) | CUTANEOUS | Status: DC

## 2015-03-07 MED ORDER — ONDANSETRON 4 MG PO TBDP: 4.0000 mg | ORAL_TABLET | Freq: Three times a day (TID) | ORAL | Status: DC | PRN

## 2015-03-07 NOTE — Patient Instructions (Signed)
[place] at [facility] Discharge Instructions  RECOMMENDATIONS MADE BY THE CONSULTANT AND ANY TEST RESULTS WILL BE SENT TO YOUR REFERRING PHYSICIAN.   Exam and discussion by Dr Whitney Muse today  Nystatin powder called into your pharmacy, dont use Desitin on it  Next 4 or 5 days if the yeast infection is not any better please call Hildred Alamin and we can get you to come in so we can look at your rash  Use vaseline for your hands for dry skin  Your blood work looks good, Tumor maker is still pending, we will call you with those results  We will refer you to genetics (karen), she comes up here once a month, we will get you that appt.  Return to see the doctor in 4 weeks  Please call the clinic if you have any questions or concerns     Thank you for choosing [name] at [facility] to provide your oncology and hematology care.  To afford each patient quality time with our provider, please arrive at least 15 minutes before your scheduled appointment time.   Beginning January 23rd 2017 lab work for the [name] will be done in the  Main lab at [facility] on 1st floor. If you have a lab appointment with the [facility] please come in thru the  Main Entrance and check in at the main information desk  You need to re-schedule your appointment should you arrive 10 or more minutes late.  We strive to give you quality time with our providers, and arriving late affects you and other patients whose appointments are after yours.  Also, if you no show three or more times for appointments you may be dismissed from the clinic at the providers discretion.     Again, thank you for choosing [facility].  Our hope is that these requests will decrease the amount of time that you wait before being seen by our physicians.       _____________________________________________________________  Should you have questions after your visit to [facility], please contact our office at [phone] between the hours of 8:30 a.m. and 4:30 p.m.  Voicemails left after 4:30 p.m. will not be returned until the following business day.  For prescription refill requests, have your pharmacy contact our office.    \

## 2015-03-07 NOTE — Progress Notes (Signed)
[facility] at [facility]  PROGRESS NOTE  Estanislado Pandy, MD [address] [place] / [place] [address]   DIAGNOSIS: Colon carcinoma with permanent ostomy Diversion Colitis Achalasia Iron deficiency, anemia secondary to GI related blood loss Hemeoccult positive stools Rising CEA with CT imaging on 07/13/2014 1.3 cm mass in L hepatic lobe Subcapsular mass in anterior pole of L kidney suspicious for RCC Cutaneous microwave ablation of her now biopsy-proven metastatic colonic adenocarcinoma to the left liver 09/01/2014   Colon cancer metastasized to liver [facility])   04/12/1987 Definitive Surgery Dr. Cleotis Nipper at [facility]   09/01/2014 Pathology Results Liver, needle/core biopsy - METASTATIC ADENOCARCINOMA.   09/01/2014 Miscellaneous MWA left liver lesion, [name] (IR)   01/18/2015 Progression PET scan   01/18/2015 PET scan Recurrence of  tumor within segment 2 of the liver. There are 2 new lesions identified within segment 5 and segment 6 of the liver worrisome for additional sites of metastasis. Evidence of tumor recurrence at the intra colonic anastomosis is noted...   01/28/2015 -  Chemotherapy Xeloda 1500 mg BID 7 days on and 7 days off beginning in December 2016.   01/29/2015 Pathology Results KRAS wild-type   CURRENT THERAPY:  XELODA IV iron prn  History of Present Illness: Karen [name] returns today for further follow-up of stage IV CRC s/p microwave ablation of a solitary liver lesion. Unfortunately repeat imaging in December revealed other liver disease and recurrence at the prior anastomotic site.  She also has a L renal lesion that is under observation. She has other health issues including chronic LBP.  Karen [name] [facility] today accompanied by her husband and daughter.  She confirms that she thinks her antibiotics gave her a yeast infection. She says she's been taking the pills for about 4 days, with 5 days left. She has applied desitin to  the site of the rash.  In terms of her genetic testing, she brings in a letter today, which she shares. We discussed this at her last visit. She is open to re-visiting genetics counseling.  Regarding general symptoms, she reports that her fingers have been really dry, so she's been applying lotion whenever possible. She was advised to use vaseline on them instead. She denies soreness or redness of her hands or feet.   Generally speaking. Karen [name] says she is eating. She adds that, these days, she's "just tired," getting to where she sits down and falls asleep very quickly "just like her husband."   She denies any mouth sores, and says "I can't think of anything else." She is currently off of her treatment and resumes on Sunday.  Ostomy output is essentially unchanged. She thinks maybe a "little more watery for a day or two during her chemotherapy."  MEDICAL HISTORY: Past Medical History  Diagnosis Date  . Chronic diastolic CHF (congestive heart failure) (HCC)     a. nl EF by echo 05/2009.  Marland Kitchen DVT of deep femoral vein (HCC)   . History of PSVT (paroxysmal supraventricular tachycardia)   . Hypertension   . Abdominal adhesions   . Ileostomy present (HCC)   . Colon polyp   . Chest pain     a. 2002 Cath: nl cors;  b. 2009 aden mv: nl;  c. 05/2009 Echo: nl. d. 2014: normal nuclear stress test, EF 69%.  . Wears dentures   . Breast lump   . Hyperlipidemia   . Thyroid disease   . Anemia   .  History of blood clots   . Hernia   . Blood transfusion   . Hearing loss   . Arthritis   . GERD (gastroesophageal reflux disease)   . Cancer ([place])     bladder  . Colon cancer ([place])     a. recurrence 2016 with liver mets -  percutaneous liver biopsy and thermal ablation of a liver metastasis by interventional radiology..  . Iron deficiency anemia due to chronic blood loss 07/06/2013  . UTI (urinary tract infection)   . Obstruction of intestine or colon ([place]) 06/2014  . Diabetes mellitus without  complication ([place]) 9/60/45    borderline type II; takes no medication for it  . Vision abnormalities 2016    not going blind but having retina problems; might lose ability to see faces  . LBBB (left bundle branch block)   . Asthma     hx of  . Depression   . CKD (chronic kidney disease), stage III     pt. states decreased kidney function  . Headache   . Fibromyalgia   . Achalasia     a. s/p botox injection for achalasia.  . Sinus bradycardia     a. HR 30s in 08/2014 requiring holding of digoxin.    has Left bundle branch block; Chest pain, exertional; Benign hypertensive heart disease without heart failure; Hypothyroid; Small bowel obstruction, partial ([place]); Dysphagia; Paroxysmal SVT (supraventricular tachycardia) ([place]); Ileostomy status ([place]); Unstable angina ([place]); Parastomal hernia; Iron deficiency anemia due to chronic blood loss; Malabsorption of iron; Acute abdominal pain; Acute renal failure ([place]); Hyperglycemia; UTI (urinary tract infection); Small bowel obstruction ([place]); Abdominal pain, acute; Liver mass, left lobe; Colon cancer metastasized to liver [facility]); Liver lesion; and Sinus bradycardia on her problem list.     is allergic to bactrim; codeine; demerol; lidocaine hcl; morphine and related; dilaudid; nitrofurantoin monohyd macro; lisinopril; and ramipril.   Iron Infusion in March 2016. Mammogram performed in 2015. Reported multiple hernias.   SURGICAL HISTORY: Past Surgical History  Procedure Laterality Date  . Cardiac catheterization  12/10/2000    THE LEFT VENTRICLE IS MILDY DILATED. THERE IS MILD TO MODERATE DIFFUSE HYPOKINESIS WITH EF 35%  . Colon cancer surgery    . Ileostomy    . Esophagogastroduodenoscopy  02/13/2011    Procedure: ESOPHAGOGASTRODUODENOSCOPY (EGD);  Surgeon: Rogene Houston, MD;  Location: [facility];  Service: Endoscopy;  Laterality: N/A;  1030  . Colonoscopy  09/26/2011    Procedure: COLONOSCOPY;  Surgeon: Rogene Houston, MD;  Location: [facility]  ENDO SUITE;  Service: Endoscopy;  Laterality: N/A;  215  . Cataract extraction w/phaco  11/13/2011    Procedure: CATARACT EXTRACTION PHACO AND INTRAOCULAR LENS PLACEMENT (IOC);  Surgeon: Tonny Branch, MD;  Location: [facility] ORS;  Service: Ophthalmology;  Laterality: Right;  CDE 12.26  . Cataract extraction w/phaco  12/08/2011    Procedure: CATARACT EXTRACTION PHACO AND INTRAOCULAR LENS PLACEMENT (IOC);  Surgeon: Tonny Branch, MD;  Location: [facility] ORS;  Service: Ophthalmology;  Laterality: Left;  CDE 15.11  . Esophagogastroduodenoscopy N/A 09/01/2013    Procedure: ESOPHAGOGASTRODUODENOSCOPY (EGD);  Surgeon: Rogene Houston, MD;  Location: [facility];  Service: Endoscopy;  Laterality: N/A;  830  . Botox injection N/A 09/01/2013    Procedure: BOTOX INJECTION;  Surgeon: Rogene Houston, MD;  Location: [facility] END[facility];  Service: Endoscopy;  Laterality: N/A;  . Abdominal hysterectomy    . Cholecystectomy    . Joint replacement Left 2008  . Tonsillectomy    .  Colon surgery    . Eye surgery  2014    catarac surgery  . Appendectomy    . Ovarian cystectomy 1955    . Ileostomy    . Coronary angioplasty    . Rotator cuff repair    . Breast surgery      right breast biopsy  . Liver biopsy and ablation  09/01/14    SOCIAL HISTORY: Social History   Social History  . Marital Status: Married    Spouse Name: N/A  . Number of Children: N/A  . Years of Education: N/A   Occupational History  . Not on file.   Social History Main Topics  . Smoking status: Former Smoker    Quit date: 02/11/1963  . Smokeless tobacco: Never Used  . Alcohol Use: No  . Drug Use: No  . Sexual Activity: Not on file   Other Topics Concern  . Not on file   Social History Narrative   Reads inspirational books   FAMILY HISTORY: Family History  Problem Relation Age of Onset  . Stroke    . Hypertension    . Heart disease Mother   . Stroke Mother     deceased  . Cancer Mother     bladder  . Arthritis Mother   .  Arthritis Father   . Heart disease Father     decesaed  . Cancer Father     leukemia  . Stroke Sister     alive/debilitated  . Hypertension Sister   . Other Sister     paralysis  . Heart disease Brother     bypass surgery  . Cancer Brother   . Arthritis Brother   . Stroke Sister     alive/debilitated  . Diabetes Sister   . Other Brother     stomach problems  . Other Brother     bladder    Review of Systems  Constitutional: Negative for fever, chills, weight loss and malaise/fatigue.  HENT: Positive for hearing loss. Negative for congestion, nosebleeds, sore throat and tinnitus.   Eyes: Negative for blurred vision, double vision, pain and discharge.  Respiratory: Negative for cough, hemoptysis, sputum production, shortness of breath and wheezing.   Cardiovascular: Negative for chest pain, palpitations, claudication, leg swelling and PND.  Gastrointestinal:  Negative for heartburn, abdominal pain, diarrhea, constipation, blood in stool and melena. ALTERED TASTE Genitourinary: Negative for dysuria, urgency, frequency and hematuria.  Musculoskeletal: Positive for joint pain. Negative for myalgias and falls.  Skin: Negative for itching and rash.  Neurological: Positive for weakness. Negative for dizziness, tingling, tremors, sensory change, speech change, focal weakness, seizures, loss of consciousness and headaches.  Endo/Heme/Allergies: Does not bruise/bleed easily.  Psychiatric/Behavioral: Negative for depression, suicidal ideas, memory loss and substance abuse. The patient is not nervous/anxious and does not have insomnia.   14 point review of systems was performed and is negative except as detailed under history of present illness and above   PHYSICAL EXAMINATION  ECOG PERFORMANCE STATUS: 1 - Symptomatic but completely ambulatory  Filed Vitals:   03/07/15 0900  BP: 127/50  Pulse: 72  Temp: 98.4 F (36.9 C)  Resp: 16    Physical Exam  Constitutional: She is  oriented to person, place, and time and well-developed, well-nourished, and in no distress. In wheelchair. HENT:  Head: Normocephalic and atraumatic. R ear with small deformity from prior surgery Nose: Nose normal.  Mouth/Throat: Oropharynx is clear and moist. No oropharyngeal exudate.  Eyes: Conjunctivae and EOM are normal.  Pupils are equal, round, and reactive to light. Right eye exhibits no discharge. Left eye exhibits no discharge. No scleral icterus.  Neck: Normal range of motion. Neck supple. No tracheal deviation present. No thyromegaly present.  Cardiovascular: Normal rate, regular rhythm and normal heart sounds.  Exam reveals no gallop and no friction rub.   No murmur heard. Pulmonary/Chest: Effort normal and breath sounds normal. She has no wheezes. She has no rales.  Abdominal: Soft. Bowel sounds are normal. . There is no tenderness. There is no rebound and no guarding. ostomy site is intact with watery dark stool. Hernia noted. Incision sites all WNL Musculoskeletal: Normal range of motion. She exhibits no edema.  Lymphadenopathy:    She has no cervical adenopathy.  Neurological: She is alert and oriented to person, place, and time. She has normal reflexes. No cranial nerve deficit.  Skin: Skin is warm and dry. No rash noted.  Psychiatric: Mood, memory, affect and judgment normal.  Nursing note and vitals reviewed.    LABORATORY DATA: I have reviewed the data as listed. CBC    Component Value Date/Time   WBC 4.4 03/07/2015 0857   RBC 3.73* 03/07/2015 0857   RBC 3.45* 11/24/2008 1500   HGB 11.4* 03/07/2015 0857   HCT 34.7* 03/07/2015 0857   PLT 164 03/07/2015 0857   MCV 93.0 03/07/2015 0857   MCH 30.6 03/07/2015 0857   MCHC 32.9 03/07/2015 0857   RDW 15.9* 03/07/2015 0857   LYMPHSABS 1.4 03/07/2015 0857   MONOABS 0.6 03/07/2015 0857   EOSABS 0.2 03/07/2015 0857   BASOSABS 0.0 03/07/2015 0857   CMP     Component Value Date/Time   NA 141 03/07/2015 0857   K 3.9  03/07/2015 0857   CL 105 03/07/2015 0857   CO2 28 03/07/2015 0857   GLUCOSE 102* 03/07/2015 0857   BUN 23* 03/07/2015 0857   CREATININE 1.16* 03/07/2015 0857   CREATININE 0.95* 12/07/2014 1512   CALCIUM 9.4 03/07/2015 0857   PROT 7.0 03/07/2015 0857   ALBUMIN 3.9 03/07/2015 0857   AST 21 03/07/2015 0857   ALT 14 03/07/2015 0857   ALKPHOS 75 03/07/2015 0857   BILITOT 0.5 03/07/2015 0857   GFRNONAA 42* 03/07/2015 0857   GFRNONAA 56* 12/07/2014 1512   GFRAA 49* 03/07/2015 0857   GFRAA 64 12/07/2014 1512   Results for Karen [name], Karen [name] (MRN [id number])   Ref. Range 12/30/2013 11:06 06/30/2014 11:20 10/19/2014 10:45 12/07/2014 15:12 01/11/2015 14:30  CEA Latest Ref Range: 0.0-4.7 ng/mL 6.1 ([name]) 35.5 ([name]) 25.3 ([name]) 12.0 ([name]) 41.6 ([name])     RADIOLOGY: I have reviewed the images detailed below and agree with the results: CLINICAL DATA: Subsequent treatment strategy for colon cancer.  EXAM: NUCLEAR MEDICINE PET SKULL BASE TO THIGH  TECHNIQUE: 13.29 mCi F-18 FDG was injected intravenously. Full-ring PET imaging was performed from the skull base to thigh after the radiotracer. CT data was obtained and used for attenuation correction and anatomic localization.  FASTING BLOOD GLUCOSE: Value: 87 mg/dl  COMPARISON: 09/09/2014 and 07/13/2014  FINDINGS: NECK  No hypermetabolic lymph nodes identified within the soft tissues of the neck.  CHEST  The heart size is enlarged. No hypermetabolic mediastinal or hilar lymph nodes. No pericardial effusion noted. Aortic atherosclerosis noted.  No pleural fluid identified. No hyper metabolic pulmonary nodules identified.  ABDOMEN/PELVIS  There is abnormal FDG accumulation within the segment 2 lesion. Residual hypermetabolic tumor measures 1.6 cm and has an SUV max equal to 12.14. Within segment 5 of  the liver there is a hypermetabolic lesion which measures approximately 1.5 cm and has an SUV max equal to 6.25. Within segment 6 of  the liver there is a lesion which measures approximately 1.8 cm and has an SUV max equal to 5.2. Within the left upper quadrant of the abdomen at the level of the intra colonic anastomosis there is an enlarging and hypermetabolic mass along the suture line. This measures 3.3 cm and has an SUV max equal to 21. Within the left lower quadrant of the abdomen there is a mass associated with the proximal sigmoid colon which measures 3.1 cm and has an SUV max equal to 23.5. Finally, within the mid sigmoid colon there is a focus of intense radiotracer uptake measuring approximately 2.6 cm with an SUV max equal to 19.9. Also suspicious for tumor.  SKELETON  No focal hypermetabolic activity to suggest skeletal metastasis.  IMPRESSION: 1. Examination is positive for recurrence of hypermetabolic tumor within segment 2 of the liver. Additionally, there are 2 new lesions identified within segment 5 and segment 6 of the liver worrisome for additional sites of metastasis. 2. Evidence of tumor recurrence at the intra colonic anastomosis is noted within the left upper quadrant of the abdomen. Additional foci of hypermetabolic tumor is identified within the proximal sigmoid colon as well as the mid sigmoid colon. 3. Aortic atherosclerosis   Electronically Signed  By: Kerby Moors M.D.  On: 01/18/2015 14:31  ASSESSMENT and THERAPY PLAN:  Colon carcinoma with permanent ostomy Diversion Colitis Achalasia Iron deficiency, anemia secondary to GI related blood loss Hemeoccult positive stools Rising CEA with CT imaging on 07/13/2014 1.3 cm mass in L hepatic lobe Subcapsular mass in anterior pole of L kidney suspicious for RCC Biopsy of L liver lesion and MWA Left liver lesion 09/01/2014 Taste Alteration KRAS WT XELODA therapy, week on, week off started 01/2015   Today her blood work looks good. We are still waiting on her CEA, and will let her know when that comes back. I discussed with the  patient my conversation with Dr. Laurence Ferrari. She is certainly fine with not traveling back to [place].   For her yeast infection, I will call in some nystatin powder. She was advised to stop using desitin on her yeast infection and to let us know if things don't improve in 5 days. She is also on diflucan.  She also knows to let us know if she is having to empty her bag more frequently,or develops hand/foot redness or soreness.  We will see her back in 4 weeks. I will also refer her to a meeting with Santiago Glad for a genetics consult.  Orders Placed This Encounter  Procedures  . CBC with Differential    Standing Status: Future     Number of Occurrences:      Standing Expiration Date: 03/06/2016  . Comprehensive metabolic panel    Standing Status: Future     Number of Occurrences:      Standing Expiration Date: 03/06/2016   All questions were answered. The patient knows to call the clinic with any problems, questions or concerns. We can certainly see the patient much sooner if necessary.   This document was electronically signed.   This document serves as a record of services personally performed by Ancil Linsey, MD. It was created on her behalf by Toni Amend, a trained medical scribe. The creation of this record is based on the scribe's personal observations and the provider's statements to them. This document has  been checked and approved by the attending provider.  I have reviewed the above documentation for accuracy and completeness, and I agree with the above.  Kelby Fam. Whitney Muse, MD

## 2015-03-08 LAB — HEMOGLOBIN A1C
HEMOGLOBIN A1C: 6 % — AB (ref 4.8–5.6)
MEAN PLASMA GLUCOSE: 126 mg/dL

## 2015-03-08 LAB — CEA: CEA: 71.6 ng/mL — ABNORMAL HIGH (ref 0.0–4.7)

## 2015-03-16 ENCOUNTER — Other Ambulatory Visit (HOSPITAL_COMMUNITY): Payer: Self-pay | Admitting: Oncology

## 2015-03-20 ENCOUNTER — Encounter (INDEPENDENT_AMBULATORY_CARE_PROVIDER_SITE_OTHER): Payer: Self-pay | Admitting: Internal Medicine

## 2015-03-20 ENCOUNTER — Ambulatory Visit (INDEPENDENT_AMBULATORY_CARE_PROVIDER_SITE_OTHER): Payer: Medicare Other | Admitting: Internal Medicine

## 2015-03-20 VITALS — BP 110/68 | HR 72 | Temp 98.2°F | Resp 18 | Ht 60.0 in | Wt 146.7 lb

## 2015-03-20 DIAGNOSIS — L309 Dermatitis, unspecified: Secondary | ICD-10-CM | POA: Diagnosis not present

## 2015-03-20 DIAGNOSIS — K5289 Other specified noninfective gastroenteritis and colitis: Secondary | ICD-10-CM | POA: Diagnosis not present

## 2015-03-20 DIAGNOSIS — K220 Achalasia of cardia: Secondary | ICD-10-CM

## 2015-03-20 MED ORDER — NYSTATIN-TRIAMCINOLONE 100000-0.1 UNIT/GM-% EX CREA
1.0000 | TOPICAL_CREAM | Freq: Two times a day (BID) | CUTANEOUS | Status: DC
Start: 2015-03-20 — End: 2016-01-15

## 2015-03-20 NOTE — Patient Instructions (Signed)
Apply Mycolog-II cream to perianal region twice a day as discussed until healed. Please keep this area dry.

## 2015-03-20 NOTE — Progress Notes (Signed)
Presenting complaint;  Follow-up for diversion colitis or abdominal pain and dysphagia.  Subjective:  Patient is 82 year old Caucasian female who is here for scheduled visit. She was last seen on 01/30/2015. She was felt to have diversion colitis or proctitis. Treatment with short chain fatty acid enemas was recommended but cost was prohibitive. She was therefore treated with 2 weeks of hydrocortisone enemas. Last dose was over 4 weeks ago. She is not having rectal discharge bleeding or lower abdominal pain. She feels swallowing difficulties about the same. She was recently treated for sinusitis with Augmentin and had difficulty swallowing Augmentin pills even though she split them into half. Her appetite is fair. She has got lost any weight since her last visit. She is on Xeloda 7 days followed by rest of 7 days. She has completed 4 cycles. Her only side effect she is experiences fatigue. She continues to complain of back and right hip pain. She uses cane for ambulation. She takes pain medication usually at night.   Current Medications: Outpatient Encounter Prescriptions as of 03/20/2015  Medication Sig  . ALPRAZolam (XANAX) 0.5 MG tablet Take 0.5 mg by mouth at bedtime.   Marland Kitchen amitriptyline (ELAVIL) 10 MG tablet Take 20 mg by mouth at bedtime.   Marland Kitchen aspirin EC 325 MG tablet Take 325 mg by mouth daily.  . Calcium Carbonate (CALTRATE 600 PO) Take by mouth daily.  . capecitabine (XELODA) 500 MG tablet Take 3 tablets by mouth twice daily after a meal. 7 days on and 7 days off.  . carvedilol (COREG) 12.5 MG tablet Take 1 tablet (12.5 mg total) by mouth 2 (two) times daily with a meal.  . HYDROcodone-acetaminophen (NORCO) 7.5-325 MG tablet Take 1 tablet by mouth every 4 (four) hours as needed.   . hyoscyamine (LEVSIN SL) 0.125 MG SL tablet Place 1 tablet (0.125 mg total) under the tongue every 6 (six) hours as needed (lower abdominal pain).  Marland Kitchen levothyroxine (SYNTHROID, LEVOTHROID) 75 MCG tablet Take 75  mcg by mouth daily.   Marland Kitchen losartan-hydrochlorothiazide (HYZAAR) 100-12.5 MG per tablet Take by mouth as directed. 1/2 TABLET DAILY. DO NOT TAKE IF YOUR TOP BLOOD PRESSURE NUMBER IS LESS THAN 110  . Multiple Vitamins-Minerals (EQL GUMMY ADULT) CHEW Chew 2 tablets by mouth daily. Reported on 02/06/2015  . NITROSTAT 0.4 MG SL tablet DISSOLVE ONE TABLET UNDER THE TONGUE EVERY 5 MINUTES AS NEEDED FOR CHEST PAIN.  DO NOT EXCEED A TOTAL OF 3 DOSES IN 15 MINUTES  . nystatin (MYCOSTATIN/NYSTOP) 100000 UNIT/GM POWD Apply 1 Bottle topically 2 (two) times daily.  . Omega-3 Fatty Acids (FISH OIL) 1000 MG CAPS Take 1 capsule by mouth daily. Reported on 02/19/2015  . ondansetron (ZOFRAN-ODT) 4 MG disintegrating tablet Take 1 tablet (4 mg total) by mouth every 8 (eight) hours as needed for nausea or vomiting.  . pantoprazole (PROTONIX) 40 MG tablet Take 40 mg by mouth daily.   . hydrocortisone (CORTENEMA) 100 MG/60ML enema Place 1 enema (100 mg total) rectally at bedtime. (Patient not taking: Reported on 03/20/2015)  . [DISCONTINUED] calcium carbonate (OSCAL) 1500 (600 CA) MG TABS tablet Take by mouth daily with breakfast. Reported on 03/20/2015  . [DISCONTINUED] fluconazole (DIFLUCAN) 100 MG tablet Take 1 tablet (100 mg total) by mouth daily. (Patient not taking: Reported on 03/20/2015)   No facility-administered encounter medications on file as of 03/20/2015.     Objective: Blood pressure 110/68, pulse 72, temperature 98.2 F (36.8 C), temperature source Oral, resp. rate 18, height 5' (1.524  m), weight 146 lb 11.2 oz (66.543 kg).  patient is alert and in no acute distress. Conjunctiva is pink. Sclera is nonicteric Oropharyngeal mucosa is normal. No neck masses or thyromegaly noted. Cardiac exam with regular rhythm normal S1 and S2. No murmur or gallop noted. Lungs are clear to auscultation. Abdomen is asymmetric with ileostomy in right low quadrant for abdomen and peristomal hernia. Abdomen is soft with mild  midepigastric tenderness. No organomegaly or masses. No LE edema or clubbing noted.  Labs/studies Results: H&H 11.4 and 34.7 on 03/07/2015 CEA 71.6 on 03/07/2015 CEA 41.6 on 11/11/2014.   Assessment:  #1. Divergent colitis. She was not treated with short chain fatty acid enemas for cost reasons. She has responded to 2 weeks of hydrocortisone enemas. She is not having any more lower abdominal pain or rectal bleeding. Need for repeat therapy will depend on her symptoms. #2. Achalasia. She had difficulty swallowing augmentin pills. She appears to be doing well with soft foods. She is maintaining her weight. Of dysphagia becomes intractable would consider Botox therapy to which she has responded in the past. #3. Perianal dermatitis posterior to anal orifice secondary to fungal infection #4. Metastatic colon carcinoma. She has completed 4 cycles of Xeloda.  She is followed by Dr. Whitney Muse. Patient's initial primary was in March 1989 but she had another primary in 2010 and surgery was complicated by anastomotic leak resulting in catastrophic illness with prolonged hospitalization and she had ileostomy.   Plan:  Mycolog-II cream to be applied to area with rash twice daily until rash resolved and when necessary. Patient needs to keep this area dry. Patient will call if rectal bleeding recurs or dysphagia progresses. Office visit in 6 months.

## 2015-03-22 ENCOUNTER — Encounter (HOSPITAL_COMMUNITY): Payer: Self-pay | Admitting: Genetic Counselor

## 2015-03-22 ENCOUNTER — Encounter (HOSPITAL_COMMUNITY): Payer: Medicare Other | Attending: Hematology and Oncology | Admitting: Genetic Counselor

## 2015-03-22 ENCOUNTER — Encounter (HOSPITAL_COMMUNITY): Payer: Medicare Other

## 2015-03-22 DIAGNOSIS — Z803 Family history of malignant neoplasm of breast: Secondary | ICD-10-CM

## 2015-03-22 DIAGNOSIS — C189 Malignant neoplasm of colon, unspecified: Secondary | ICD-10-CM

## 2015-03-22 DIAGNOSIS — Z8052 Family history of malignant neoplasm of bladder: Secondary | ICD-10-CM

## 2015-03-22 DIAGNOSIS — C787 Secondary malignant neoplasm of liver and intrahepatic bile duct: Secondary | ICD-10-CM

## 2015-03-22 DIAGNOSIS — Z800 Family history of malignant neoplasm of digestive organs: Secondary | ICD-10-CM

## 2015-03-22 DIAGNOSIS — Z315 Encounter for genetic counseling: Secondary | ICD-10-CM

## 2015-03-22 DIAGNOSIS — Z808 Family history of malignant neoplasm of other organs or systems: Secondary | ICD-10-CM

## 2015-03-22 DIAGNOSIS — D509 Iron deficiency anemia, unspecified: Secondary | ICD-10-CM | POA: Insufficient documentation

## 2015-03-22 NOTE — Progress Notes (Signed)
REFERRING PROVIDER: [name], MD [place], [place] [address]   Ancil Linsey, MD  PRIMARY PROVIDER:  [name], MD  PRIMARY REASON FOR VISIT:  1. Colon cancer metastasized to liver ([place])   2. Family history of colon cancer   3. Family history of bladder cancer   4. Family history of breast cancer      HISTORY OF PRESENT ILLNESS:   Karen [name], a 82 y.o. female, was seen for a [place] cancer genetics consultation at the request of Dr. Whitney Muse due to a personal and family history of cancer.  Ms. Bautch presents to clinic today to discuss the possibility of a hereditary predisposition to cancer, genetic testing, and to further clarify her future cancer risks, as well as potential cancer risks for family members.   In 1989, at the age of 68, Ms. Hults was diagnosed with cancer of the transverse colon. This was treated with surgery and removal of 10 inches of her colon. Ms. Samonte had been seen in the past for genetic testing but we do not have access to those records to know what type of testing she had. In 2010, at the age of 73, Ms. Mikami was diagnosed with colon and bladder cancer.  Both of these were diagnosed with surgery.  In June 2016, Ms. Pinney was diagnosed with colon cancer and this is being treated with chemotherapy pills.    CANCER HISTORY:    Colon cancer metastasized to liver [facility])   04/12/1987 Definitive Surgery Dr. Lindalou Hose at [facility]   09/01/2014 Pathology Results Liver, needle/core biopsy - METASTATIC ADENOCARCINOMA.   09/01/2014 Miscellaneous MWA left liver lesion, [name] (IR)   01/18/2015 Progression PET scan   01/18/2015 PET scan Recurrence of  tumor within segment 2 of the liver. There are 2 new lesions identified within segment 5 and segment 6 of the liver worrisome for additional sites of metastasis. Evidence of tumor recurrence at the intra colonic anastomosis is noted...   01/28/2015 -  Chemotherapy Xeloda 1500 mg BID 7  days on and 7 days off beginning in December 2016.     HORMONAL RISK FACTORS:  Menarche was at age 80.  First live birth at age 63.  OCP use for approximately 0 years.  Ovaries intact: no.  Hysterectomy: yes.  Menopausal status: postmenopausal.  HRT use: many years. Colonoscopy: yes; normal. Mammogram within the last year: yes. Number of breast biopsies: 1. Up to date with pelvic exams:  no. Any excessive radiation exposure in the past:  no  Past Medical History  Diagnosis Date  . Chronic diastolic CHF (congestive heart failure) (HCC)     a. nl EF by echo 05/2009.  Marland Kitchen DVT of deep femoral vein ([place])   . History of PSVT (paroxysmal supraventricular tachycardia)   . Hypertension   . Abdominal adhesions   . Ileostomy present ([place])   . Colon polyp   . Chest pain     a. 2002 Cath: nl cors;  b. 2009 aden mv: nl;  c. 05/2009 Echo: nl. d. 2014: normal nuclear stress test, EF 69%.  . Wears dentures   . Breast lump   . Hyperlipidemia   . Thyroid disease   . Anemia   . History of blood clots   . Hernia   . Blood transfusion   . Hearing loss   . Arthritis   . GERD (gastroesophageal reflux disease)   . Cancer ([place])     bladder  . Colon  cancer [facility])     a. recurrence 2016 with liver mets -  percutaneous liver biopsy and thermal ablation of a liver metastasis by interventional radiology..  . Iron deficiency anemia due to chronic blood loss 07/06/2013  . UTI (urinary tract infection)   . Obstruction of intestine or colon ([place]) 06/2014  . Diabetes mellitus without complication ([place]) 6/50/35    borderline type II; takes no medication for it  . Vision abnormalities 2016    not going blind but having retina problems; might lose ability to see faces  . LBBB (left bundle branch block)   . Asthma     hx of  . Depression   . CKD (chronic kidney disease), stage III     pt. states decreased kidney function  . Headache   . Fibromyalgia   . Achalasia     a. s/p botox injection for  achalasia.  . Sinus bradycardia     a. HR 30s in 08/2014 requiring holding of digoxin.  . Family history of colon cancer   . Family history of bladder cancer   . Family history of breast cancer     Past Surgical History  Procedure Laterality Date  . Cardiac catheterization  12/10/2000    THE LEFT VENTRICLE IS MILDY DILATED. THERE IS MILD TO MODERATE DIFFUSE HYPOKINESIS WITH EF 35%  . Colon cancer surgery    . Ileostomy    . Esophagogastroduodenoscopy  02/13/2011    Procedure: ESOPHAGOGASTRODUODENOSCOPY (EGD);  Surgeon: Rogene Houston, MD;  Location: [facility];  Service: Endoscopy;  Laterality: N/A;  1030  . Colonoscopy  09/26/2011    Procedure: COLONOSCOPY;  Surgeon: Rogene Houston, MD;  Location: [facility];  Service: Endoscopy;  Laterality: N/A;  215  . Cataract extraction w/phaco  11/13/2011    Procedure: CATARACT EXTRACTION PHACO AND INTRAOCULAR LENS PLACEMENT (IOC);  Surgeon: Tonny Branch, MD;  Location: [facility] ORS;  Service: Ophthalmology;  Laterality: Right;  CDE 12.26  . Cataract extraction w/phaco  12/08/2011    Procedure: CATARACT EXTRACTION PHACO AND INTRAOCULAR LENS PLACEMENT (IOC);  Surgeon: Tonny Branch, MD;  Location: [facility] ORS;  Service: Ophthalmology;  Laterality: Left;  CDE 15.11  . Esophagogastroduodenoscopy N/A 09/01/2013    Procedure: ESOPHAGOGASTRODUODENOSCOPY (EGD);  Surgeon: Rogene Houston, MD;  [facility];  Service: Endoscopy;  Laterality: N/A;  830  . Botox injection N/A 09/01/2013    Procedure: BOTOX INJECTION;  Surgeon: Rogene Houston, MD;  [facility]: [facility];  Service: Endoscopy;  Laterality: N/A;  . Abdominal hysterectomy    . Cholecystectomy    . Joint replacement Left 2008  . Tonsillectomy    . Colon surgery    . Eye surgery  2014    catarac surgery  . Appendectomy    . Ovarian cystectomy 1955    . Ileostomy    . Coronary angioplasty    . Rotator cuff repair    . Breast surgery      right breast biopsy  . Liver biopsy and ablation   09/01/14    Social History   Social History  . Marital Status: Married    Spouse Name: N/A  . Number of Children: 4  . Years of Education: N/A   Social History Main Topics  . Smoking status: Former Smoker -- 0.25 packs/day for 13 years    Types: Cigarettes    Quit date: 02/11/1963  . Smokeless tobacco: Never Used  . Alcohol Use: No  . Drug Use:  No  . Sexual Activity: Not Asked   Other Topics Concern  . None   Social History Narrative     FAMILY HISTORY:  We obtained a detailed, 4-generation family history.  Significant diagnoses are listed below: Family History  Problem Relation Age of Onset  . Stroke    . Hypertension    . Heart disease Mother   . Stroke Mother     deceased  . Arthritis Mother   . Bladder Cancer Mother     dx in her 50s  . Colon cancer Mother   . Arthritis Father   . Heart disease Father     decesaed  . Leukemia Father   . Stroke Sister     alive/debilitated  . Hypertension Sister   . Other Sister     paralysis  . Heart disease Brother     bypass surgery  . Arthritis Brother   . Colon cancer Brother   . Bladder Cancer Brother   . Stroke Sister     alive/debilitated  . Diabetes Sister   . Other Brother     stomach problems  . Other Brother     bladder  . Colon cancer Paternal Aunt   . Bladder Cancer Other     dx twice in her 30s-40s  . Breast cancer Other     great niece dx in her early 53s  . Prostate cancer Other     newphew  . Colon polyps Daughter     The patient has two boys and two girls, one daughter has colon polyps.  The patient had three brothers and two sisters.  One brother was diagnosed with both colon and bladder cancer over the age of 62, another brother had a daughter with bladder cancer twice starting in her 36s, a third brother has a granddaughter with breast cancer in her 44s.  One sister died of complications from a stroke at 61 and the other sister is living and has a son with prostate cancer and recently has  another tumor located between his urethra and testicle.  The patients mother was diagnosed with bladder cancer in her 16s and colon cancer under 10.  She had four brothers and two sisters who did not have cancer.  The patient's father had leukemia in his 8s.  He had five sisters and four brothers.  One sister had colon cancer.  Patient's maternal ancestors are of English descent, and paternal ancestors are of Caucasian descent. There is no reported Ashkenazi Jewish ancestry. There is no known consanguinity.  GENETIC COUNSELING ASSESSMENT: NAZIA RHINES is a 82 y.o. female with a personal history of colon and bladder cancer and family history of colon, bladder and breast cancer which is somewhat suggestive of a Lynch syndrome and predisposition to cancer. We, therefore, discussed and recommended the following at today's visit.   DISCUSSION: We discussed that about 5-10% of colon cancer is the result of hereditary colon cancer syndromes, with most being the result of Lynch syndrome.  We reviewed Lynch syndrome and previous testing that she had.  Ms. Sherburne had genetic testing in the past and had received a letter from [place] indicating that her testing could be updated.  We do not have access to those results at outreach.  I explained that testing for Lynch syndrome has improved and that we can look at additional genes that can be associated with other forms of hereditary colon cancer.  We also discussed that the great niece who had breast  cancer in her 80s could possibly have had genetic testing.  It would be important to learn more about whether she had testing and if she tested positive for anything specific.  We reviewed the characteristics, features and inheritance patterns of hereditary cancer syndromes. We also discussed genetic testing, including the appropriate family members to test, the process of testing, insurance coverage and turn-around-time for results. We discussed the implications of a negative,  positive and/or variant of uncertain significant result. We recommended Ms. Lockyer pursue genetic testing for the Comprehensive cancer gene panel and MSH2 inversion. The Colorectal Cancer Panel offered by [facility] includes sequencing and/or duplication/deletion testing of the following 19 genes: APC, ATM, AXIN2, BMPR1A, CDH1, CHEK2, EPCAM, MLH1, MSH2, MSH6, MUTYH, PMS2, POLD1, POLE, PTEN, SCG5/GREM1, SMAD4, STK11, and TP53.     Based on Ms. Bray's personal and family history of cancer, she meets medical criteria for genetic testing. Despite that she meets criteria, she may still have an out of pocket cost. We discussed that if her out of pocket cost for testing is over $100, the laboratory will call and confirm whether she wants to proceed with testing.  If the out of pocket cost of testing is less than $100 she will be billed by the genetic testing laboratory.   PLAN: After considering the risks, benefits, and limitations, Ms. Olgin  provided informed consent to pursue genetic testing and the blood sample was sent to [facility] for analysis of the Comprehensive cancer panel and MSH2 inveraion. Results should be available within approximately 2-3 weeks' time, at which point they will be disclosed by telephone to Ms. Decamp, as will any additional recommendations warranted by these results. Ms. Grupp will receive a summary of her genetic counseling visit and a copy of her results once available. This information will also be available in Epic. We encouraged Ms. Gries to remain in contact with cancer genetics annually so that we can continuously update the family history and inform her of any changes in cancer genetics and testing that may be of benefit for her family. Ms. Eveland questions were answered to her satisfaction today. Our contact information was provided should additional questions or concerns arise.  Lastly, we encouraged Ms. Melito to remain in contact with cancer genetics  annually so that we can continuously update the family history and inform her of any changes in cancer genetics and testing that may be of benefit for this family.   Ms.  Mruk questions were answered to her satisfaction today. Our contact information was provided should additional questions or concerns arise. Thank you for the referral and allowing [place] to share in the care of your patient.   Jovahn Breit P. Florene Glen, [place], [facility] Certified Genetic Counselor [email or web address] phone: [phone]  The patient was seen for a total of 60 minutes in face-to-face genetic counseling.  This patient was discussed with Drs. Magrinat, Lindi Adie and/or Burr Medico who agrees with the above.    _______________________________________________________________________ For Office Staff:  Number of people involved in session: 3 Was an Intern/ student involved with case: no

## 2015-03-23 ENCOUNTER — Encounter: Payer: Self-pay | Admitting: Genetic Counselor

## 2015-03-23 DIAGNOSIS — Z1379 Encounter for other screening for genetic and chromosomal anomalies: Secondary | ICD-10-CM | POA: Insufficient documentation

## 2015-03-23 DIAGNOSIS — R300 Dysuria: Secondary | ICD-10-CM | POA: Diagnosis not present

## 2015-03-23 DIAGNOSIS — N3000 Acute cystitis without hematuria: Secondary | ICD-10-CM | POA: Diagnosis not present

## 2015-04-01 ENCOUNTER — Encounter (HOSPITAL_COMMUNITY): Payer: Self-pay | Admitting: Hematology & Oncology

## 2015-04-06 ENCOUNTER — Encounter (HOSPITAL_BASED_OUTPATIENT_CLINIC_OR_DEPARTMENT_OTHER): Payer: Medicare Other | Admitting: Hematology & Oncology

## 2015-04-06 ENCOUNTER — Encounter (HOSPITAL_COMMUNITY): Payer: Medicare Other

## 2015-04-06 ENCOUNTER — Encounter (HOSPITAL_COMMUNITY): Payer: Self-pay | Admitting: Hematology & Oncology

## 2015-04-06 ENCOUNTER — Telehealth (HOSPITAL_COMMUNITY): Payer: Self-pay | Admitting: *Deleted

## 2015-04-06 VITALS — BP 108/50 | HR 71 | Temp 98.5°F | Resp 18 | Wt 148.6 lb

## 2015-04-06 DIAGNOSIS — C189 Malignant neoplasm of colon, unspecified: Secondary | ICD-10-CM | POA: Diagnosis not present

## 2015-04-06 DIAGNOSIS — C787 Secondary malignant neoplasm of liver and intrahepatic bile duct: Secondary | ICD-10-CM

## 2015-04-06 DIAGNOSIS — M2550 Pain in unspecified joint: Secondary | ICD-10-CM | POA: Diagnosis not present

## 2015-04-06 DIAGNOSIS — D500 Iron deficiency anemia secondary to blood loss (chronic): Secondary | ICD-10-CM | POA: Diagnosis not present

## 2015-04-06 DIAGNOSIS — N2889 Other specified disorders of kidney and ureter: Secondary | ICD-10-CM

## 2015-04-06 DIAGNOSIS — D509 Iron deficiency anemia, unspecified: Secondary | ICD-10-CM | POA: Diagnosis not present

## 2015-04-06 LAB — COMPREHENSIVE METABOLIC PANEL
ALBUMIN: 3.7 g/dL (ref 3.5–5.0)
ALK PHOS: 70 U/L (ref 38–126)
ALT: 11 U/L — AB (ref 14–54)
ANION GAP: 9 (ref 5–15)
AST: 20 U/L (ref 15–41)
BUN: 25 mg/dL — ABNORMAL HIGH (ref 6–20)
CALCIUM: 9 mg/dL (ref 8.9–10.3)
CO2: 24 mmol/L (ref 22–32)
Chloride: 107 mmol/L (ref 101–111)
Creatinine, Ser: 1.35 mg/dL — ABNORMAL HIGH (ref 0.44–1.00)
GFR calc non Af Amer: 35 mL/min — ABNORMAL LOW (ref >60)
GFR, EST AFRICAN AMERICAN: 41 mL/min — AB (ref >60)
GLUCOSE: 119 mg/dL — AB (ref 65–99)
POTASSIUM: 3.5 mmol/L (ref 3.5–5.1)
SODIUM: 140 mmol/L (ref 135–145)
Total Bilirubin: 0.5 mg/dL (ref 0.3–1.2)
Total Protein: 7.2 g/dL (ref 6.5–8.1)

## 2015-04-06 LAB — CBC WITH DIFFERENTIAL/PLATELET
BASOS ABS: 0 10*3/uL (ref 0.0–0.1)
Basophils Relative: 1 %
EOS ABS: 0.1 10*3/uL (ref 0.0–0.7)
EOS PCT: 3 %
HCT: 33.4 % — ABNORMAL LOW (ref 36.0–46.0)
HEMOGLOBIN: 11.2 g/dL — AB (ref 12.0–15.0)
Lymphocytes Relative: 28 %
Lymphs Abs: 1.2 10*3/uL (ref 0.7–4.0)
MCH: 31.5 pg (ref 26.0–34.0)
MCHC: 33.5 g/dL (ref 30.0–36.0)
MCV: 94.1 fL (ref 78.0–100.0)
Monocytes Absolute: 0.6 10*3/uL (ref 0.1–1.0)
Monocytes Relative: 14 %
NEUTROS PCT: 55 %
Neutro Abs: 2.4 10*3/uL (ref 1.7–7.7)
PLATELETS: 146 10*3/uL — AB (ref 150–400)
RBC: 3.55 MIL/uL — AB (ref 3.87–5.11)
RDW: 17.2 % — ABNORMAL HIGH (ref 11.5–15.5)
WBC: 4.3 10*3/uL (ref 4.0–10.5)

## 2015-04-06 MED ORDER — FENTANYL 12 MCG/HR TD PT72: 12.5000 ug | MEDICATED_PATCH | TRANSDERMAL | Status: DC

## 2015-04-06 NOTE — Telephone Encounter (Signed)
[facility] by [facility] requested on [id number]. Spoke with [employer].

## 2015-04-06 NOTE — Patient Instructions (Addendum)
[place] at [facility] Discharge Instructions  RECOMMENDATIONS MADE BY THE CONSULTANT AND ANY TEST RESULTS WILL BE SENT TO YOUR REFERRING PHYSICIAN.   Exam and discussion by Dr Whitney Muse today Repeat CT scans Fentanyl 12.5mg  patch, 1 patch change every 3 days, we can increase this as we need too, by Monday if she is doing well with the medication you can increase to two patches If you have any trouble with this medication please call and let us know. Return to see the doctor after scans  Please call the clinic if you have any questions or concerns    Thank you for choosing [place] at [facility] to provide your oncology and hematology care.  To afford each patient quality time with our provider, please arrive at least 15 minutes before your scheduled appointment time.   Beginning January 23rd 2017 lab work for the [name] will be done in the  Main lab at [facility] on 1st floor. If you have a lab appointment with the [facility] please come in thru the  Main Entrance and check in at the main information desk  You need to re-schedule your appointment should you arrive 10 or more minutes late.  We strive to give you quality time with our providers, and arriving late affects you and other patients whose appointments are after yours.  Also, if you no show three or more times for appointments you may be dismissed from the clinic at the providers discretion.     Again, thank you for choosing [facility].  Our hope is that these requests will decrease the amount of time that you wait before being seen by our physicians.       _____________________________________________________________  Should you have questions after your visit to [facility], please contact our office at [phone] between the hours of 8:30 a.m. and 4:30 p.m.  Voicemails left after 4:30 p.m. will not be returned until the following business  day.  For prescription refill requests, have your pharmacy contact our office.

## 2015-04-06 NOTE — Progress Notes (Signed)
[facility], MD [place] / [place] [address]   DIAGNOSIS: Colon carcinoma with permanent ostomy Diversion Colitis Achalasia Iron deficiency, anemia secondary to GI related blood loss Hemeoccult positive stools Rising CEA with CT imaging on 07/13/2014 1.3 cm mass in L hepatic lobe Subcapsular mass in anterior pole of L kidney suspicious for RCC Cutaneous microwave ablation of her now biopsy-proven metastatic colonic adenocarcinoma to the left liver 09/01/2014   Colon cancer metastasized to liver [facility])   04/12/1987 Definitive Surgery Dr. Lindalou Hose at [facility]   09/01/2014 Pathology Results Liver, needle/core biopsy - METASTATIC ADENOCARCINOMA.   09/01/2014 Miscellaneous MWA left liver lesion, [name] (IR)   01/18/2015 Progression PET scan   01/18/2015 PET scan Recurrence of  tumor within segment 2 of the liver. There are 2 new lesions identified within segment 5 and segment 6 of the liver worrisome for additional sites of metastasis. Evidence of tumor recurrence at the intra colonic anastomosis is noted...   01/28/2015 -  Chemotherapy Xeloda 1500 mg BID 7 days on and 7 days off beginning in December 2016.   CURRENT THERAPY:  XELODA IV iron prn  History of Present Illness: Fradel returns today for further follow-up of stage IV CRC s/p microwave ablation of a solitary liver lesion. Unfortunately repeat imaging in December revealed other liver disease and recurrence at the prior anastomotic site.  She also has a L renal lesion that is under observation. She has other health issues including chronic LBP.  Mrs. Mille returns to the [facility] today accompanied by her daughter.  Regarding her recent left-sided abdominal pain, she notes that she "doesn't know what happened with her side." She just knows that it started in at her waist, indicating her left side, and "was just like a knife every time I  breathed." She says then, on Sunday afternoon, "it just went away." Before that though, she notes that the pain was bad. She says she took her pain medicine every 4 hours but that "it was like she hadn't even taken it." She says she didn't call in to the [facility] because she "figured no one could see her." She says she didn't know what the pain was and that, off and on, it would start at her waist, just "hurting in this bone." She remarks that it didn't last "but just a little bit" before going away. She says she "couldn't even sniff her nose" without it hurting. She say she slept some, but not much, after taking her pain medicine and xanax. She continues to remark that the pain just went away just like it started, that "it just left me, and I sure was thankful."  She notes that she was recently treated for a UTI, and had a lot of white cells in her urine, which was sent off for a culture. She received cipro for her UTI, got better, and notes that her culture came back negative. She received diflucan to take afterwards and remarks "it's all right now."  Regarding other pain, she indicates her right hip and says "sometimes I start to get up and I can't put more weight on my leg." She says "I think when I had this one done in 2008, most of the cushioning was gone; so I think it's probably bone rubbing against bone is the reason it hurts all the time."  She notes that she has never been on long-acting  pain medicine, or doesn't remember it if she has.   Her daughter notes that she complains of feeling full all the time and that she's just not eating. Mrs. Barro says "it's like I'm just getting bigger," indicating her abdomen. She denies having to buy any new pants.  Her living will and end of life desires were brought up and discussed today. In general, she says that she really doesn't remember what all was covered on her living will. She says that she wants to be kept alive unless there is no hope at  all, and that if she is dying she would like to be at home. She also aggressively remarks that she "wants nothing to do with hospice" because of a bad experience with a loved one. She says "I might change my mind, but right now, I don't want hospice." She was advised that her family needs to know of her end of life plans, to make sure she has someone to care for her at her end of life, and in order to ease the burden on her loved ones. She repeats "I would just rather be at home," and indicates that her daughters will likely be available to care for her as well.  She says she had a bad day yesterday, where she wanted to cry all day, and that "I don't like to feel that way," she wants to be on her feet, and cook.   MEDICAL HISTORY: Past Medical History  Diagnosis Date  . Chronic diastolic CHF (congestive heart failure) (HCC)     a. nl EF by echo 05/2009.  Marland Kitchen DVT of deep femoral vein ([place])   . History of PSVT (paroxysmal supraventricular tachycardia)   . Hypertension   . Abdominal adhesions   . Ileostomy present ([place])   . Colon polyp   . Chest pain     a. 2002 Cath: nl cors;  b. 2009 aden mv: nl;  c. 05/2009 Echo: nl. d. 2014: normal nuclear stress test, EF 69%.  . Wears dentures   . Breast lump   . Hyperlipidemia   . Thyroid disease   . Anemia   . History of blood clots   . Hernia   . Blood transfusion   . Hearing loss   . Arthritis   . GERD (gastroesophageal reflux disease)   . Cancer ([place])     bladder  . Colon cancer ([place])     a. recurrence 2016 with liver mets -  percutaneous liver biopsy and thermal ablation of a liver metastasis by interventional radiology..  . Iron deficiency anemia due to chronic blood loss 07/06/2013  . UTI (urinary tract infection)   . Obstruction of intestine or colon ([place]) 06/2014  . Diabetes mellitus without complication ([place]) 9/67/89    borderline type II; takes no medication for it  . Vision abnormalities 2016    not going blind but having retina  problems; might lose ability to see faces  . LBBB (left bundle branch block)   . Asthma     hx of  . Depression   . CKD (chronic kidney disease), stage III     pt. states decreased kidney function  . Headache   . Fibromyalgia   . Achalasia     a. s/p botox injection for achalasia.  . Sinus bradycardia     a. HR 30s in 08/2014 requiring holding of digoxin.  . Family history of colon cancer   . Family history of bladder cancer   .  Family history of breast cancer     has Left bundle branch block; Chest pain, exertional; Benign hypertensive heart disease without heart failure; Hypothyroid; Small bowel obstruction, partial (HCC); Dysphagia; Paroxysmal SVT (supraventricular tachycardia) (HCC); Ileostomy status (HCC); Unstable angina (HCC); Parastomal hernia; Iron deficiency anemia due to chronic blood loss; Malabsorption of iron; Acute abdominal pain; Acute renal failure (HCC); Hyperglycemia; UTI (urinary tract infection); Small bowel obstruction (HCC); Abdominal pain, acute; Liver mass, left lobe; Colon cancer metastasized to liver [facility]); Liver lesion; Sinus bradycardia; Family history of colon cancer; Family history of bladder cancer; Family history of breast cancer; and Genetic testing on her problem list.     is allergic to bactrim; codeine; demerol; lidocaine hcl; morphine and related; dilaudid; nitrofurantoin monohyd macro; lisinopril; and ramipril.   Iron Infusion in March 2016. Mammogram performed in 2015. Reported multiple hernias.   SURGICAL HISTORY: Past Surgical History  Procedure Laterality Date  . Cardiac catheterization  12/10/2000    THE LEFT VENTRICLE IS MILDY DILATED. THERE IS MILD TO MODERATE DIFFUSE HYPOKINESIS WITH EF 35%  . Colon cancer surgery    . Ileostomy    . Esophagogastroduodenoscopy  02/13/2011    Procedure: ESOPHAGOGASTRODUODENOSCOPY (EGD);  Surgeon: Malissa Hippo, MD;  Location: [facility];  Service: Endoscopy;  Laterality: N/A;  1030  . Colonoscopy   09/26/2011    Procedure: COLONOSCOPY;  Surgeon: Malissa Hippo, MD;  Location: [facility];  Service: Endoscopy;  Laterality: N/A;  215  . Cataract extraction w/phaco  11/13/2011    Procedure: CATARACT EXTRACTION PHACO AND INTRAOCULAR LENS PLACEMENT (IOC);  Surgeon: Gemma Payor, MD;  Location: [facility] OR[facility];  Service: Ophthalmology;  Laterality: Right;  CDE 12.26  . Cataract extraction w/phaco  12/08/2011    Procedure: CATARACT EXTRACTION PHACO AND INTRAOCULAR LENS PLACEMENT (IOC);  Surgeon: Gemma Payor, MD;  Location: [facility];  Service: Ophthalmology;  Laterality: Left;  CDE 15.11  . Esophagogastroduodenoscopy N/A 09/01/2013    Procedure: ESOPHAGOGASTRODUODENOSCOPY (EGD);  Surgeon: Malissa Hippo, MD;  [facility]: [facility];  Service: Endoscopy;  Laterality: N/A;  830  . Botox injection N/A 09/01/2013    Procedure: BOTOX INJECTION;  Surgeon: Malissa Hippo, MD;  Location: [facility];  Service: Endoscopy;  Laterality: N/A;  . Abdominal hysterectomy    . Cholecystectomy    . Joint replacement Left 2008  . Tonsillectomy    . Colon surgery    . Eye surgery  2014    catarac surgery  . Appendectomy    . Ovarian cystectomy 1955    . Ileostomy    . Coronary angioplasty    . Rotator cuff repair    . Breast surgery      right breast biopsy  . Liver biopsy and ablation  09/01/14    SOCIAL HISTORY: Social History   Social History  . Marital Status: Married    Spouse Name: N/A  . Number of Children: 4  . Years of Education: N/A   Occupational History  . Not on file.   Social History Main Topics  . Smoking status: Former Smoker -- 0.25 packs/day for 13 years    Types: Cigarettes    Quit date: 02/11/1963  . Smokeless tobacco: Never Used  . Alcohol Use: No  . Drug Use: No  . Sexual Activity: Not on file   Other Topics Concern  . Not on file   Social History Narrative   Reads inspirational books   FAMILY HISTORY: Family History  Problem Relation  Age of Onset  . Stroke    .  Hypertension    . Heart disease Mother   . Stroke Mother     deceased  . Arthritis Mother   . Bladder Cancer Mother     dx in her 61s  . Colon cancer Mother   . Arthritis Father   . Heart disease Father     decesaed  . Leukemia Father   . Stroke Sister     alive/debilitated  . Hypertension Sister   . Other Sister     paralysis  . Heart disease Brother     bypass surgery  . Arthritis Brother   . Colon cancer Brother   . Bladder Cancer Brother   . Stroke Sister     alive/debilitated  . Diabetes Sister   . Other Brother     stomach problems  . Other Brother     bladder  . Colon cancer Paternal Aunt   . Bladder Cancer Other     dx twice in her 30s-40s  . Breast cancer Other     great niece dx in her early 44s  . Prostate cancer Other     newphew  . Colon polyps Daughter     Review of Systems  Constitutional: Negative for fever, chills, weight loss and malaise/fatigue.  HENT: Positive for hearing loss. Negative for congestion, nosebleeds, sore throat and tinnitus.   Eyes: Negative for blurred vision, double vision, pain and discharge.  Respiratory: Negative for cough, hemoptysis, sputum production, shortness of breath and wheezing.   Cardiovascular: Negative for chest pain, palpitations, claudication, leg swelling and PND.  Gastrointestinal:  Negative for heartburn, abdominal pain, diarrhea, constipation, blood in stool and melena. ALTERED TASTE Genitourinary: Negative for dysuria, urgency, frequency and hematuria.  Musculoskeletal: Positive for joint pain. Negative for myalgias and falls.  Skin: Negative for itching and rash.  Neurological: Positive for weakness. Negative for dizziness, tingling, tremors, sensory change, speech change, focal weakness, seizures, loss of consciousness and headaches.  Endo/Heme/Allergies: Does not bruise/bleed easily.  Psychiatric/Behavioral: Negative for depression, suicidal ideas, memory loss and substance abuse. The patient is not  nervous/anxious and does not have insomnia.   14 point review of systems was performed and is negative except as detailed under history of present illness and above   PHYSICAL EXAMINATION  ECOG PERFORMANCE STATUS: 1 - Symptomatic but completely ambulatory  Filed Vitals:   04/06/15 0936  BP: 108/50  Pulse: 71  Temp: 98.5 F (36.9 C)  Resp: 18    Physical Exam  Constitutional: She is oriented to person, place, and time and well-developed, well-nourished, and in no distress. In wheelchair, limiting PE HENT:  Head: Normocephalic and atraumatic. R ear with small deformity from prior surgery Nose: Nose normal.  Mouth/Throat: Oropharynx is clear and moist. No oropharyngeal exudate.  Eyes: Conjunctivae and EOM are normal. Pupils are equal, round, and reactive to light. Right eye exhibits no discharge. Left eye exhibits no discharge. No scleral icterus.  Neck: Normal range of motion. Neck supple. No tracheal deviation present. No thyromegaly present.  Cardiovascular: Normal rate, regular rhythm and normal heart sounds.  Exam reveals no gallop and no friction rub.   No murmur heard. Pulmonary/Chest: Effort normal and breath sounds normal. She has no wheezes. She has no rales.  Abdominal: Soft. Bowel sounds are normal. . There is no tenderness. There is no rebound and no guarding. ostomy site is intact with watery dark stool. Hernia noted. Incision sites all WNL Musculoskeletal: Normal  range of motion. She exhibits no edema.  Lymphadenopathy:    She has no cervical adenopathy.  Neurological: She is alert and oriented to person, place, and time. She has normal reflexes. No cranial nerve deficit.  Skin: Skin is warm and dry. No rash noted.  Psychiatric: Mood, memory, affect and judgment normal.  Nursing note and vitals reviewed.    LABORATORY DATA: I have reviewed the data as listed. CBC    Component Value Date/Time   WBC 4.3 04/06/2015 0900   RBC 3.55* 04/06/2015 0900   RBC 3.45*  11/24/2008 1500   HGB 11.2* 04/06/2015 0900   HCT 33.4* 04/06/2015 0900   PLT 146* 04/06/2015 0900   MCV 94.1 04/06/2015 0900   MCH 31.5 04/06/2015 0900   MCHC 33.5 04/06/2015 0900   RDW 17.2* 04/06/2015 0900   LYMPHSABS 1.2 04/06/2015 0900   MONOABS 0.6 04/06/2015 0900   EOSABS 0.1 04/06/2015 0900   BASOSABS 0.0 04/06/2015 0900   CMP     Component Value Date/Time   NA 140 04/06/2015 0900   K 3.5 04/06/2015 0900   CL 107 04/06/2015 0900   CO2 24 04/06/2015 0900   GLUCOSE 119* 04/06/2015 0900   BUN 25* 04/06/2015 0900   CREATININE 1.35* 04/06/2015 0900   CREATININE 0.95* 12/07/2014 1512   CALCIUM 9.0 04/06/2015 0900   PROT 7.2 04/06/2015 0900   ALBUMIN 3.7 04/06/2015 0900   AST 20 04/06/2015 0900   ALT 11* 04/06/2015 0900   ALKPHOS 70 04/06/2015 0900   BILITOT 0.5 04/06/2015 0900   GFRNONAA 35* 04/06/2015 0900   GFRNONAA 56* 12/07/2014 1512   GFRAA 41* 04/06/2015 0900   GFRAA 64 12/07/2014 1512   Results for Byrer, Lilymarie H (MRN [id number])   Ref. Range 12/30/2013 11:06 06/30/2014 11:20 10/19/2014 10:45 12/07/2014 15:12 01/11/2015 14:30  CEA Latest Ref Range: 0.0-4.7 ng/mL 6.1 (H) 35.5 (H) 25.3 (H) 12.0 (H) 41.6 (H)     RADIOLOGY: I have reviewed the images detailed below and agree with the results: CLINICAL DATA: Subsequent treatment strategy for colon cancer.  EXAM: NUCLEAR MEDICINE PET SKULL BASE TO THIGH  TECHNIQUE: 13.29 mCi F-18 FDG was injected intravenously. Full-ring PET imaging was performed from the skull base to thigh after the radiotracer. CT data was obtained and used for attenuation correction and anatomic localization.  FASTING BLOOD GLUCOSE: Value: 87 mg/dl  COMPARISON: 09/09/2014 and 07/13/2014  FINDINGS: NECK  No hypermetabolic lymph nodes identified within the soft tissues of the neck.  CHEST  The heart size is enlarged. No hypermetabolic mediastinal or hilar lymph nodes. No pericardial effusion noted. Aortic  atherosclerosis noted.  No pleural fluid identified. No hyper metabolic pulmonary nodules identified.  ABDOMEN/PELVIS  There is abnormal FDG accumulation within the segment 2 lesion. Residual hypermetabolic tumor measures 1.6 cm and has an SUV max equal to 12.14. Within segment 5 of the liver there is a hypermetabolic lesion which measures approximately 1.5 cm and has an SUV max equal to 6.25. Within segment 6 of the liver there is a lesion which measures approximately 1.8 cm and has an SUV max equal to 5.2. Within the left upper quadrant of the abdomen at the level of the intra colonic anastomosis there is an enlarging and hypermetabolic mass along the suture line. This measures 3.3 cm and has an SUV max equal to 21. Within the left lower quadrant of the abdomen there is a mass associated with the proximal sigmoid colon which measures 3.1 cm and has an SUV max  equal to 23.5. Finally, within the mid sigmoid colon there is a focus of intense radiotracer uptake measuring approximately 2.6 cm with an SUV max equal to 19.9. Also suspicious for tumor.  SKELETON  No focal hypermetabolic activity to suggest skeletal metastasis.  IMPRESSION: 1. Examination is positive for recurrence of hypermetabolic tumor within segment 2 of the liver. Additionally, there are 2 new lesions identified within segment 5 and segment 6 of the liver worrisome for additional sites of metastasis. 2. Evidence of tumor recurrence at the intra colonic anastomosis is noted within the left upper quadrant of the abdomen. Additional foci of hypermetabolic tumor is identified within the proximal sigmoid colon as well as the mid sigmoid colon. 3. Aortic atherosclerosis   Electronically Signed  By: Kerby Moors M.D.  On: 01/18/2015 14:31  ASSESSMENT and THERAPY PLAN:  Colon carcinoma with permanent ostomy Diversion Colitis Achalasia Iron deficiency, anemia secondary to GI related blood  loss Hemeoccult positive stools Rising CEA with CT imaging on 07/13/2014 1.3 cm mass in L hepatic lobe Subcapsular mass in anterior pole of L kidney suspicious for RCC Biopsy of L liver lesion and MWA Left liver lesion 09/01/2014 Taste Alteration KRAS WT XELODA therapy, week on, week off started 01/2015    I will write her for a low-dose fentanyl patch and repeat her scans. Her CEA continues to rise and we discussed that XELODA is most likely not benefitting her at this point. She is very persistent on trying other therapy. She is kras WT and would be a candidate for EGFR directed therapy.   We will regroup after CT imaging, and continue to go up on long acting pain medication until we reach a dose that works for her.  She was advised strongly to call up here if she is having any trouble, and I reminded her that we could work her in any time that is necessary.  The results of her genetic testing are not back yet. Santiago Glad will let me know.  Orders Placed This Encounter  Procedures  . CT Chest W Contrast    Standing Status: Future     Number of Occurrences: 1     Standing Expiration Date: 04/05/2016    Order Specific Question:  If indicated for the ordered procedure, I authorize the administration of contrast media per Radiology protocol    Answer:  Yes    Order Specific Question:  Reason for Exam (SYMPTOM  OR DIAGNOSIS REQUIRED)    Answer:  stage Iv CRC, restaging    Order Specific Question:  Preferred imaging location?    Answer:  [facility]  . CT Abdomen Pelvis W Wo Contrast    Standing Status: Future     Number of Occurrences: 1     Standing Expiration Date: 07/12/2016    Order Specific Question:  If indicated for the ordered procedure, I authorize the administration of contrast media per Radiology protocol    Answer:  Yes    Order Specific Question:  Reason for Exam (SYMPTOM  OR DIAGNOSIS REQUIRED)    Answer:  stage IV CRC, restaging    Order Specific Question:  Preferred  imaging location?    Answer:  [facility]  . CEA    Standing Status: Future     Number of Occurrences: 1     Standing Expiration Date: 04/05/2016    All questions were answered. The patient knows to call the clinic with any problems, questions or concerns. We can certainly see the patient  much sooner if necessary.   This document was electronically signed.   This document serves as a record of services personally performed by Ancil Linsey, MD. It was created on her behalf by Toni Amend, a trained medical scribe. The creation of this record is based on the scribe's personal observations and the provider's statements to them. This document has been checked and approved by the attending provider.  I have reviewed the above documentation for accuracy and completeness, and I agree with the above.  Kelby Fam. Whitney Muse, MD

## 2015-04-07 LAB — CEA: CEA: 96 ng/mL — AB (ref 0.0–4.7)

## 2015-04-09 ENCOUNTER — Telehealth (HOSPITAL_COMMUNITY): Payer: Self-pay | Admitting: *Deleted

## 2015-04-09 ENCOUNTER — Encounter (HOSPITAL_COMMUNITY): Payer: Self-pay | Admitting: *Deleted

## 2015-04-09 ENCOUNTER — Ambulatory Visit: Payer: Self-pay | Admitting: Genetic Counselor

## 2015-04-09 ENCOUNTER — Telehealth: Payer: Self-pay | Admitting: Genetic Counselor

## 2015-04-09 DIAGNOSIS — Z1379 Encounter for other screening for genetic and chromosomal anomalies: Secondary | ICD-10-CM

## 2015-04-09 DIAGNOSIS — Z803 Family history of malignant neoplasm of breast: Secondary | ICD-10-CM

## 2015-04-09 DIAGNOSIS — Z800 Family history of malignant neoplasm of digestive organs: Secondary | ICD-10-CM

## 2015-04-09 DIAGNOSIS — C189 Malignant neoplasm of colon, unspecified: Secondary | ICD-10-CM

## 2015-04-09 DIAGNOSIS — C787 Secondary malignant neoplasm of liver and intrahepatic bile duct: Secondary | ICD-10-CM

## 2015-04-09 DIAGNOSIS — Z8052 Family history of malignant neoplasm of bladder: Secondary | ICD-10-CM

## 2015-04-09 NOTE — Telephone Encounter (Signed)
Patient called to the nurse on call yday to let her know that she took all 6 Xeloda tablets in the am. She didn't seem to have any side effects from doing this. Pt was rushing around yday morning(Sunday morning)- trying to get ready for church. Patient is doing ok and only took 3 pills this am.

## 2015-04-09 NOTE — Progress Notes (Signed)
HPI: Ms. Ojo was previously seen in the [place] clinic due to a personal and family history of cancer and concerns regarding a hereditary predisposition to cancer. Please refer to our prior cancer genetics clinic note for more information regarding Ms. Lynds's medical, social and family histories, and our assessment and recommendations, at the time. Ms. Corker recent genetic test results were disclosed to her, as were recommendations warranted by these results. These results and recommendations are discussed in more detail below.  FAMILY HISTORY:  We obtained a detailed, 4-generation family history.  Significant diagnoses are listed below: Family History  Problem Relation Age of Onset  . Stroke    . Hypertension    . Heart disease Mother   . Stroke Mother     deceased  . Arthritis Mother   . Bladder Cancer Mother     dx in her 68s  . Colon cancer Mother   . Arthritis Father   . Heart disease Father     decesaed  . Leukemia Father   . Stroke Sister     alive/debilitated  . Hypertension Sister   . Other Sister     paralysis  . Heart disease Brother     bypass surgery  . Arthritis Brother   . Colon cancer Brother   . Bladder Cancer Brother   . Stroke Sister     alive/debilitated  . Diabetes Sister   . Other Brother     stomach problems  . Other Brother     bladder  . Colon cancer Paternal Aunt   . Bladder Cancer Other     dx twice in her 30s-40s  . Breast cancer Other     great niece dx in her early 32s  . Prostate cancer Other     newphew  . Colon polyps Daughter     The patient has two boys and two girls, one daughter has colon polyps. The patient had three brothers and two sisters. One brother was diagnosed with both colon and bladder cancer over the age of 57, another brother had a daughter with bladder cancer twice starting in her 18s, a third brother has a granddaughter with breast cancer in her 34s. One sister died of complications  from a stroke at 33 and the other sister is living and has a son with prostate cancer and recently has another tumor located between his urethra and testicle. The patients mother was diagnosed with bladder cancer in her 16s and colon cancer under 6. She had four brothers and two sisters who did not have cancer. The patient's father had leukemia in his 27s. He had five sisters and four brothers. One sister had colon cancer. Patient's maternal ancestors are of English descent, and paternal ancestors are of Caucasian descent. There is no reported Ashkenazi Jewish ancestry. There is no known consanguinity.  GENETIC TEST RESULTS: At the time of Ms. Ledwell's visit, we recommended she pursue genetic testing of the custom gene panel looking at genes associated with colon, bladder, and breast cancer. The Custom Cancer Panel offered by [facility] includes sequencing and/or deletion duplication testing of the following 42 genes: APC, ATM, AXIN2, BARD1, BMPR1A, BRCA1, BRCA2, BRIP1, CDH1, CDK4, CDKN2A, CHEK2, EPCAM, FANCC, FH, FLCN, MET, MITF, MLH1, MSH2, MSH6, MUTYH, NBN, PALB2, PMS2, POLD1, POLE, PTEN, RAD51C, RAD51D, SCG5/GREM1, SDHB, SDHC, SDHD, SMAD4, STK11, TP53, TSC1, TSC2, VHL, and XRCC2.  The report date was April 06, 2015. Genetic testing was normal, and did not reveal a deleterious mutation  in these genes. The test report has been scanned into EPIC and is located under the Molecular Pathology section of the Results Review tab.   We discussed with Ms. Mousel that since the current genetic testing is not perfect, it is possible there may be a gene mutation in one of these genes that current testing cannot detect, but that chance is small. We also discussed, that it is possible that another gene that has not yet been discovered, or that we have not yet tested, is responsible for the cancer diagnoses in the family, and it is, therefore, important to remain in touch with cancer genetics in the future so that  we can continue to offer Ms. Brent the most up to date genetic testing.   CANCER SCREENING RECOMMENDATIONS:  Given Ms. Nicosia's personal and family histories, we must interpret these negative results with some caution.  Families with features suggestive of hereditary risk for cancer tend to have multiple family members with cancer, diagnoses in multiple generations and diagnoses before the age of 42. Ms. Boutin family exhibits some of these features. Thus this result may simply reflect our current inability to detect all mutations within these genes or there may be a different gene that has not yet been discovered or tested.   This negative genetic test simply tells [place] that we cannot yet define why Ms. Dunaway has had colorectal cancer several times, as well as a diagnosis of other cancers. Ms. [facility] medical management and screening should be based on the prospect that she may be at an increased risk for additional cancer in the future and should, therefore, undergo more frequent colonoscopy screening at intervals determined by her GI providers.    RECOMMENDATIONS FOR FAMILY MEMBERS: Individuals in this family might be at some increased risk of developing cancer, over the general population risk, simply due to the family history of cancer. We recommended women in this family have a yearly mammogram beginning at age 59, or 28 years younger than the earliest onset of cancer, an an annual clinical breast exam, and perform monthly breast self-exams. Women in this family should also have a gynecological exam as recommended by their primary provider. All family members should have a colonoscopy by age 96.  Based on Ms. Harr's family history, we recommended her niece, who was diagnosed with bladder cancer in her 32s, have genetic counseling and testing. Ms. Bedonie will let us know if we can be of any assistance in coordinating genetic counseling and/or testing for this family member.   FOLLOW-UP:  Lastly, we discussed with Ms. Oscarson that cancer genetics is a rapidly advancing field and it is possible that new genetic tests will be appropriate for her and/or her family members in the future. We encouraged her to remain in contact with cancer genetics on an annual basis so we can update her personal and family histories and let her know of advances in cancer genetics that may benefit this family.   Our contact number was provided. Ms. Leonor questions were answered to her satisfaction, and she knows she is welcome to call us at anytime with additional questions or concerns.   Roma Kayser, MS, [facility] Certified Genetic Counselor [email or web address]

## 2015-04-09 NOTE — Telephone Encounter (Signed)
LN on VM with good news.  Asked that she return my call.

## 2015-04-09 NOTE — Progress Notes (Signed)
[facility] by [facility] requested on specimen [id number]. Maudie Mercury said ok.

## 2015-04-12 ENCOUNTER — Ambulatory Visit (HOSPITAL_COMMUNITY)
Admission: RE | Admit: 2015-04-12 | Discharge: 2015-04-12 | Disposition: A | Discharge status: Home / Self Care | Payer: Medicare Other | Source: Ambulatory Visit | Attending: Hematology & Oncology | Admitting: Hematology & Oncology

## 2015-04-12 DIAGNOSIS — N289 Disorder of kidney and ureter, unspecified: Secondary | ICD-10-CM | POA: Diagnosis not present

## 2015-04-12 DIAGNOSIS — Z85038 Personal history of other malignant neoplasm of large intestine: Secondary | ICD-10-CM | POA: Diagnosis not present

## 2015-04-12 DIAGNOSIS — K6389 Other specified diseases of intestine: Secondary | ICD-10-CM | POA: Diagnosis not present

## 2015-04-12 DIAGNOSIS — I700 Atherosclerosis of aorta: Secondary | ICD-10-CM | POA: Diagnosis not present

## 2015-04-12 DIAGNOSIS — C787 Secondary malignant neoplasm of liver and intrahepatic bile duct: Secondary | ICD-10-CM | POA: Insufficient documentation

## 2015-04-12 DIAGNOSIS — I2510 Atherosclerotic heart disease of native coronary artery without angina pectoris: Secondary | ICD-10-CM | POA: Insufficient documentation

## 2015-04-12 DIAGNOSIS — I517 Cardiomegaly: Secondary | ICD-10-CM | POA: Diagnosis not present

## 2015-04-12 DIAGNOSIS — C189 Malignant neoplasm of colon, unspecified: Secondary | ICD-10-CM | POA: Diagnosis not present

## 2015-04-12 DIAGNOSIS — N2889 Other specified disorders of kidney and ureter: Secondary | ICD-10-CM | POA: Diagnosis not present

## 2015-04-12 MED ORDER — IOHEXOL 300 MG/ML  SOLN
80.0000 mL | Freq: Once | INTRAMUSCULAR | Status: AC | PRN
  Administered 2015-04-12: 80 mL via INTRAVENOUS

## 2015-04-13 ENCOUNTER — Telehealth (HOSPITAL_COMMUNITY): Payer: Self-pay | Admitting: *Deleted

## 2015-04-13 ENCOUNTER — Other Ambulatory Visit (HOSPITAL_COMMUNITY): Payer: Self-pay | Admitting: Hematology & Oncology

## 2015-04-13 NOTE — Telephone Encounter (Signed)
Pt instructed that she can increase her pain patch to 2 patches at a time. Patient states she feels bloated and that last pm she had trouble sleeping. She states that she has been nauseated since the CT scan yesterday. Pt got really hungry last night but didn't want to get up because of the pain it causes to get up. She has been hurting but states that currently she is not hurting because she is reclining. She states that if she moves her right leg it makes her right hip hurt. She had an injection 1 year ago for this problem. Patient states she would rather hurt than go through the side effects of pain medication. She states that her abdomen looks swollen and her pants feel tight. She states that she has diarrhea all the time while taking the Xeloda. Patient states that she isn't taking Imodium because she doesn't want to cause a blockage. She states that every time she takes the Xeloda she feels bloated and swollen. She says the directions say to take a whole glass of water with it and it is all she can do to "get it down". Saturday 04/14/15 is her last dose of Xeloda.

## 2015-04-16 ENCOUNTER — Encounter (HOSPITAL_COMMUNITY): Payer: Self-pay | Admitting: Hematology & Oncology

## 2015-04-16 ENCOUNTER — Encounter (HOSPITAL_COMMUNITY): Payer: Medicare Other

## 2015-04-16 ENCOUNTER — Encounter (HOSPITAL_COMMUNITY): Payer: Medicare Other | Attending: Hematology and Oncology | Admitting: Hematology & Oncology

## 2015-04-16 VITALS — BP 109/50 | HR 70 | Temp 98.4°F | Resp 16 | Wt 151.0 lb

## 2015-04-16 DIAGNOSIS — C787 Secondary malignant neoplasm of liver and intrahepatic bile duct: Secondary | ICD-10-CM

## 2015-04-16 DIAGNOSIS — C189 Malignant neoplasm of colon, unspecified: Secondary | ICD-10-CM | POA: Diagnosis not present

## 2015-04-16 DIAGNOSIS — N2889 Other specified disorders of kidney and ureter: Secondary | ICD-10-CM

## 2015-04-16 DIAGNOSIS — D500 Iron deficiency anemia secondary to blood loss (chronic): Secondary | ICD-10-CM

## 2015-04-16 DIAGNOSIS — G893 Neoplasm related pain (acute) (chronic): Secondary | ICD-10-CM

## 2015-04-16 DIAGNOSIS — R52 Pain, unspecified: Secondary | ICD-10-CM | POA: Diagnosis not present

## 2015-04-16 DIAGNOSIS — Z7189 Other specified counseling: Secondary | ICD-10-CM

## 2015-04-16 DIAGNOSIS — D509 Iron deficiency anemia, unspecified: Secondary | ICD-10-CM | POA: Insufficient documentation

## 2015-04-16 DIAGNOSIS — Z1379 Encounter for other screening for genetic and chromosomal anomalies: Secondary | ICD-10-CM

## 2015-04-16 MED ORDER — FENTANYL 25 MCG/HR TD PT72: 25.0000 ug | MEDICATED_PATCH | TRANSDERMAL | Status: DC

## 2015-04-16 NOTE — Progress Notes (Signed)
Vectibix - Side Effects: Dermatologic toxicity including dermatitis acneform (acne rash), itching, redness to skin, rash, skin exfoliation, paronychia (condition of nails), dry skin, skin fissures, which may be complicated by abscesses and sepsis (infection), feeling tired, abdominal pain, low magnesium, infusion reactions.    Wear sunscreen and hats to limit sun exposure.  Takes 1 hour to infuse. You will not need premeds. You will receive this every 14 days.

## 2015-04-16 NOTE — Patient Instructions (Addendum)
[place] at [facility] Discharge Instructions  RECOMMENDATIONS MADE BY THE CONSULTANT AND ANY TEST RESULTS WILL BE SENT TO YOUR REFERRING PHYSICIAN.   Your cancer is growing. So we are stopping the Xeloda. We are going to give you a drug called Vectibix (Panitumumab). It can cause a rash, hair can get coarse and darker, dry skin. You should not get sick on your stomach while taking this. This drug is a targeted therapy.    We have to get your pain controlled. We are increasing your Fentanyl patch to 79mcg. If you need your hydrocodone - take it. Overtime, as we get your pain controlled with the Fentanyl patch, you will need less hydrocodone. We will need to give this a good 2 weeks - to get your pain under control.  You may take 1/2 a Xanax or a whole Xanax.   We are starting your treatments this Friday with Vectibix (Panitumumab). This will go through a vein.   It is ok to have hope but we also need to be preparing. Preparing doesn't mean you are giving up on life or God. But we have to have these conversations now while you are doing good. This is the best time to discuss end of life issues (while you feel good).   Vectibix (Panitumumab) - Side Effects: Dermatologic toxicity including dermatitis acneform (acne rash), itching, redness to skin, rash, skin exfoliation, paronychia (condition of nails), dry skin, skin fissures, which may be complicated by abscesses and sepsis (infection), feeling tired, abdominal pain, low magnesium, infusion reactions (trouble breathing, tightness in chest, chest pain, itching, dizziness, rash).    Wear sunscreen and hats to limit sun exposure.  Takes 1 hour to infuse. You will not need premeds. You will receive this every 14 days.        Thank you for choosing [place] at [facility] to provide your oncology and hematology care.  To afford each patient quality time with our provider, please arrive at least  15 minutes before your scheduled appointment time.   Beginning January 23rd 2017 lab work for the [name] will be done in the  Main lab at [facility] on 1st floor. If you have a lab appointment with the [facility] please come in thru the  Main Entrance and check in at the main information desk  You need to re-schedule your appointment should you arrive 10 or more minutes late.  We strive to give you quality time with our providers, and arriving late affects you and other patients whose appointments are after yours.  Also, if you no show three or more times for appointments you may be dismissed from the clinic at the providers discretion.     Again, thank you for choosing [facility].  Our hope is that these requests will decrease the amount of time that you wait before being seen by our physicians.       _____________________________________________________________  Should you have questions after your visit to [facility], please contact our office at [phone] between the hours of 8:30 a.m. and 4:30 p.m.  Voicemails left after 4:30 p.m. will not be returned until the following business day.  For prescription refill requests, have your pharmacy contact our office.         Resources For Cancer Patients and their Caregivers ? American Cancer Society: Can assist with transportation, wigs, general needs, runs Look Good Feel Better.  314-470-3631 ? Cancer Care: Provides financial assistance, online support groups, medication/co-pay assistance.  1-800-813-HOPE 215 808 6036) ? [facility] Assists [place] cancer patients and their families through emotional , educational and financial support.  (515)401-4180 ? [place] DSS Where to apply for food stamps, Medicaid and utility assistance. 215-246-4940 ? [facility]: Transportation to medical appointments. 618-292-0435 ? Social Security Administration: May apply for disability  if have a Stage IV cancer. 416-060-2620 3476080404 ? [employer], Disability and Transit Services: Assists with nutrition, care and transit needs. (218)766-6338 Panitumumab Solution for Injection What is this medicine? PANITUMUMAB (pan i TOOM ue mab) is a monoclonal antibody. It is used to treat colorectal cancer. This medicine may be used for other purposes; ask your health care provider or pharmacist if you have questions. What should I tell my health care provider before I take this medicine? They need to know if you have any of these conditions: -lung disease, especially lung fibrosis -skin conditions or sensitivity -an unusual or allergic reaction to panitumumab, mouse proteins, other medicines, foods, dyes, or preservatives -pregnant or trying to get pregnant -breast-feeding How should I use this medicine? This drug is given as an infusion into a vein. It is administered in a hospital or clinic by a specially trained health care professional. Talk to your pediatrician regarding the use of this medicine in children. Special care may be needed. Overdosage: If you think you have taken too much of this medicine contact a poison control center or emergency room at once. NOTE: This medicine is only for you. Do not share this medicine with others. What if I miss a dose? It is important not to miss your dose. Call your doctor or health care professional if you are unable to keep an appointment. What may interact with this medicine? -some medicines for cancer This list may not describe all possible interactions. Give your health care provider a list of all the medicines, herbs, non-prescription drugs, or dietary supplements you use. Also tell them if you smoke, drink alcohol, or use illegal drugs. Some items may interact with your medicine. What should I watch for while using this medicine? Visit your doctor for checks on your progress. This drug may make you feel generally unwell.  This is not uncommon, as chemotherapy can affect healthy cells as well as cancer cells. Report any side effects. Continue your course of treatment even though you feel ill unless your doctor tells you to stop. This medicine can make you more sensitive to the sun. Keep out of the sun while receiving this medicine and for 2 months after the last dose. If you cannot avoid being in the sun, wear protective clothing and use sunscreen. Do not use sun lamps or tanning beds/booths. In some cases, you may be given additional medicines to help with side effects. Follow all directions for their use. Call your doctor or health care professional for advice if you get a fever, chills or sore throat, or other symptoms of a cold or flu. Do not treat yourself. This drug decreases your body's ability to fight infections. Try to avoid being around people who are sick. Avoid taking products that contain aspirin, acetaminophen, ibuprofen, naproxen, or ketoprofen unless instructed by your doctor. These medicines may hide a fever. Do not become pregnant while taking this medicine and for 6 months after the last dose. Women should inform their doctor if they wish to become pregnant or think they might be pregnant. Men should not father a  child while taking this medicine and for 6 months after the last dose. There is a potential for serious side effects to an unborn child. Talk to your health care professional or pharmacist for more information. Do not breast-feed an infant while taking this medicine. What side effects may I notice from receiving this medicine? Side effects that you should report to your doctor or health care professional as soon as possible: -allergic reactions like skin rash, itching or hives, swelling of the face, lips, or tongue -breathing problems -changes in vision -fast, irregular heartbeat -feeling faint or lightheaded, falls -fever, chills -mouth sores -swelling of the ankles, feet, hands -unusually  weak or tired Side effects that usually do not require medical attention (report to your doctor or health care professional if they continue or are bothersome): -changes in skin like acne, cracks, skin dryness -constipation -diarrhea -eyelash growth -headache -nail changes -nausea, vomiting -stomach upset This list may not describe all possible side effects. Call your doctor for medical advice about side effects. You may report side effects to FDA at 1-800-FDA-1088. Where should I keep my medicine? This drug is given in a hospital or clinic and will not be stored at home. NOTE: This sheet is a summary. It may not cover all possible information. If you have questions about this medicine, talk to your doctor, pharmacist, or health care provider.    2016, Elsevier/Gold Standard. (2014-03-28 17:21:33) fentanyl skin patch What is this medicine? FENTANYL (FEN ta nil) is a pain reliever. It is used to treat persistent, moderate to severe chronic pain. It is used only by people who have been taking an opioid or narcotic pain medicine for more than one week. This medicine may be used for other purposes; ask your health care provider or pharmacist if you have questions. What should I tell my health care provider before I take this medicine? They need to know if you have any of these conditions: -brain tumor -Crohn's disease, inflammatory bowel disease, or ulcerative colitis -drug abuse or addiction -head injury -heart disease -if you frequently drink alcohol containing drinks -kidney disease or problems going to the bathroom -liver disease -lung disease, asthma, or breathing problems -mental problems -skin problems -taken isocarboxazid, phenelzine, tranylcypromine, or selegiline in the past 2 weeks -an allergic or unusual reaction to fentanyl, meperidine, other medicines, foods, dyes, or preservatives -pregnant or trying to get pregnant -breast-feeding How should I use this  medicine? Apply the patch to your skin. Do not cut or damage the patch. A cut or damaged patch can be very dangerous because you may get too much medicine. Select a clean, dry area of skin above your waist on your front or back. The upper back is a good spot to put the patch on children or people who are confused because it will be hard for them to remove the patch. Do not apply the patch to oily, broken, burned, cut, or irritated skin. Use only water to clean the area. Do not use soap or alcohol to clean the skin because this can increase the effects of the medicine. If the area is hairy, clip the hair with scissors, but do not shave. Take the patch out of its wrapper, and take off the protective strip over the sticky part. Do not use a patch if the packaging or backing is damaged. Do not touch the sticky part with your fingers. Press the sticky surface to the skin using the palm of your hand. Press the patch to the skin  for 30 seconds. Wash your hands at once with soap and water. Keep patches far away from children. Do not let children see you apply the patch and do not apply it where children can see it. Do not call the patch a sticker, tattoo, or bandage, as this could encourage the child to mimic your actions. Used patches still contain medicine. Children or pets can have serious side effects or die from putting used patches in their mouth or on their bodies. Take off the old patch before putting on a new patch. Apply each new patch to a different area of skin. If a patch comes off or causes irritation, remove it and apply a new patch to different site. If the edges of the patch start to loosen, first apply first aid tape to the edges of the patch. If problems with the patch not sticking continue, cover the patch with a see-through adhesive dressing (like Bioclusive or Tegaderm). Never cover the patch with any other bandage or tape. To get rid of used patches, fold the patch in half with the sticky sides  together. Then, flush it down the toilet. Do not discard the patch in the garbage. Pets and children can be harmed if they find used or lost patches. Replace the patch every 3 days or as directed by your doctor or health care professional. Follow the directions on the prescription label. Do not take more medicine than you are told to take. A special MedGuide will be given to you by the pharmacist with each prescription and refill. Be sure to read this information carefully each time. Talk to your pediatrician regarding the use of this medicine in children. While this drug may be prescribed for children as young as 50 years old for selected conditions, precautions do apply. If someone accidentally uses a fentanyl patch and is not awake and alert, immediately call 911 for help. If the person is awake and alert, call a doctor, health care professional, or the [employer]. Overdosage: If you think you have taken too much of this medicine contact a poison control center or emergency room at once. NOTE: This medicine is only for you. Do not share this medicine with others. What if I miss a dose? If you forget to replace your patch, take off the old patch and put on a new patch as soon as you can. Do not apply an extra patch to your skin. Do not wear more than one patch at the same time unless told to do so by your doctor or health care professional. What may interact with this medicine? -alcohol -antihistamines -clarithromycin -diltiazem -erythromycin -grapefruit juice -herbal products that contain St. John's wort -itraconazole -ketoconazole -medicines for depression, anxiety, or psychotic disturbances -medicines for sleep -muscle relaxants -naltrexone -narcotic medicines (opiates) for pain -nelfinavir -nicardipine -phenobarbital, phenytoin, or fosphenytoin -rifampin -ritonavir -troleandomycin -verapamil This list may not describe all possible interactions. Give your health care  provider a list of all the medicines, herbs, non-prescription drugs, or dietary supplements you use. Also tell them if you smoke, drink alcohol, or use illegal drugs. Some items may interact with your medicine. What should I watch for while using this medicine? Other pain medicine may be needed the first day you use the patch because the patch can take some time to start working. Tell your doctor or health care professional if your pain does not go away, if it gets worse, or if you have new or a different type of pain. You may  develop tolerance to the medicine. Tolerance means that you will need a higher dose of the medicine for pain relief. Tolerance is normal and is expected if you take the medicine for a long time. Have a family member watch you for side effects during the first day you wear a patch or if your dose changes. Do not suddenly stop taking your medicine because you may develop a severe reaction. Your body becomes used to the medicine. This does NOT mean you are addicted. Addiction is a behavior related to getting and using a drug for a non-medical reason. If you have pain, you have a medical reason to take pain medicine. Your doctor will tell you how much medicine to take. If your doctor wants you to stop the medicine, the dose will be slowly lowered over time to avoid any side effects. You may get drowsy or dizzy when you first start taking the medicine or change doses. Do not drive, use machinery, or do anything that may be dangerous until you know how the medicine affects you. Stand or sit up slowly. There are different types of narcotic medicines (opiates) for pain. If you take more than one type at the same time, you may have more side effects. Give your health care provider a list of all medicines you use. Your doctor will tell you how much medicine to take. Do not take more medicine than directed. Call emergency for help if you have problems breathing. The medicine will cause  constipation. Try to have a bowel movement at least every 2 to 3 days. If you do not have a bowel movement for 3 days, call your doctor or health care professional. Your mouth may get dry. Drinking water, chewing sugarless gum, or sucking on hard candy may help. See your dentist every 6 months. Heat can increase the amount of medicine released from the patch. Do not get the patch hot by using heating pads, heated water beds, electric blankets, and heat lamps. You can bathe or swim while using the patch. But, do not use a sauna or hot tub. Tell you doctor or health care professional if you get a fever. If gel leaks from the patch, wash your skin well with water only. Do not use soap or cleansers containing alcohol. Remove the patch from your skin and discard as instructed above. Medicine from a partly attached or leaking patch can transfer to a child or pet when they are being held. Exposure of children or pets to the patch can cause serious side effects and even death. If you are going to have a magnetic resonance imaging (MRI) procedure, tell your MRI technician if you have this patch on your body. It must be removed before a MRI. What side effects may I notice from receiving this medicine? Side effects that you should report to your doctor or health care professional as soon as possible: -allergic reactions like skin rash, itching or hives, swelling of the face, lips, or tongue -breathing problems -changes in vision -confusion -feeling faint, lightheaded -fever, flu-like symptoms -hallucination -high or low blood pressure -irregular heartbeat -problems with balance, talking, walking -trouble passing urine or change in the amount of urine -unusual bleeding or bruising -unusually weak or tired Side effects that usually do not require medical attention (report to your doctor or health care professional if they continue or are bothersome): -constipation -dry mouth -itching where the patch was  applied -loss of appetite -nausea, vomiting -sweating This list may not describe all possible  side effects. Call your doctor for medical advice about side effects. You may report side effects to FDA at 1-800-FDA-1088. Where should I keep my medicine? Keep out of the reach of children. This medicine can be abused. Keep your medicine in a safe place to protect it from theft. Do not share this medicine with anyone. Selling or giving away this medicine is dangerous and against the law. Store below 77 degrees F (25 degrees C). Do not store the patches out of their wrappers. This medicine may cause accidental overdose and death if it is taken by other adults, children, or pets. Flush any unused medicine down the toilet as instructed above to reduce the chance of harm. Do not use the medicine after the expiration date. NOTE: This sheet is a summary. It may not cover all possible information. If you have questions about this medicine, talk to your doctor, pharmacist, or health care provider.    2016, Elsevier/Gold Standard. (2012-08-25 17:25:22)

## 2015-04-16 NOTE — Progress Notes (Signed)
[facility], MD [place] / [place] [address]   DIAGNOSIS: Colon carcinoma with permanent ostomy Diversion Colitis Achalasia Iron deficiency, anemia secondary to GI related blood loss Hemeoccult positive stools Rising CEA with CT imaging on 07/13/2014 1.3 cm mass in L hepatic lobe Subcapsular mass in anterior pole of L kidney suspicious for RCC Cutaneous microwave ablation of her now biopsy-proven metastatic colonic adenocarcinoma to the left liver 09/01/2014   Colon cancer metastasized to liver [facility])   04/12/1987 Definitive Surgery Dr. Lindalou Hose at [facility]   09/01/2014 Pathology Results Liver, needle/core biopsy - METASTATIC ADENOCARCINOMA.   09/01/2014 Miscellaneous MWA left liver lesion, [name] (IR)   01/18/2015 Progression PET scan   01/18/2015 PET scan Recurrence of  tumor within segment 2 of the liver. There are 2 new lesions identified within segment 5 and segment 6 of the liver worrisome for additional sites of metastasis. Evidence of tumor recurrence at the intra colonic anastomosis is noted...   01/28/2015 - 04/16/2015 Chemotherapy Xeloda 1500 mg BID 7 days on and 7 days off beginning in December 2016.   04/12/2015 Imaging CT C/A/P progression of disease, enlargement of metastatic lesion in segments 2/3 of liver, development of mass with colon at junction of descending colon and sigmoid, indeterminate lesion in lower pole of the left kidney   CURRENT THERAPY:  XELODA IV iron prn  History of Present Illness: Karen [name] returns today for further follow-up of stage IV CRC s/p microwave ablation of a solitary liver lesion. Unfortunately repeat imaging in December revealed other liver disease and recurrence at the prior anastomotic site.  She also has a L renal lesion that is under observation. She has other health issues including chronic LBP. She has been on XELODA. She is here today to review re-staging CT  scans as rising CEA and symptoms suggest recurrent disease.  Karen [name] was here with husband and her two daughters. She also was in wheelchair during her visit.   She said that she was hurting bad last weekend. Last Thursday she said that she hurt from her waist down. Her daughter also stated that she said that she hurt all over. She said that she took her pain medication at 10 pm and that she had to take another one at 2 am. She said that she finally went to sleep after that but that she got up at 5 and didn't sleep long. She said that Thursday she "didn't sleep but a little bit" and that she laid there a long time. She states that she did not go to sleep after 1 am. She took a pain pill at 3 am. She also stated that she can only sleep on her right side but that it hurts. The patient also mentioned that her hip and groin hurt a lot at night.  She said that the last 3 nights she has been taking half a Xanax before she went to bed.   She said that "if I can just sleep at night and do what I have to do" She said that she has a lot that she wants to do but that she cannot do it now. She said that she wants to clean and that she is going through her old documents and is shredding them.   She said that hydrocodone is the only pain medication that has not bothered her and that everything else makes her heart "cut up"  and affects her breathing. She said that she was on Percocet but that she did not take it.   We discussed increasing her Fentanyl patch. She was worried about how this would affect her heart "cutting up" and her daughter was worried about how her pain medication would affect her other night time medicine.   We discussed putting her on Vectibix and she was worried if this would make her sick to her stomach. Her husband asked when and how she would have to take it.   She said that her fingers toes and feet are really tender from her XELODA.   MEDICAL HISTORY: Past Medical History    Diagnosis Date  . Chronic diastolic CHF (congestive heart failure) (HCC)     a. nl EF by echo 05/2009.  Marland Kitchen DVT of deep femoral vein ([place])   . History of PSVT (paroxysmal supraventricular tachycardia)   . Hypertension   . Abdominal adhesions   . Ileostomy present ([place])   . Colon polyp   . Chest pain     a. 2002 Cath: nl cors;  b. 2009 aden mv: nl;  c. 05/2009 Echo: nl. d. 2014: normal nuclear stress test, EF 69%.  . Wears dentures   . Breast lump   . Hyperlipidemia   . Thyroid disease   . Anemia   . History of blood clots   . Hernia   . Blood transfusion   . Hearing loss   . Arthritis   . GERD (gastroesophageal reflux disease)   . Cancer ([place])     bladder  . Colon cancer ([place])     a. recurrence 2016 with liver mets -  percutaneous liver biopsy and thermal ablation of a liver metastasis by interventional radiology..  . Iron deficiency anemia due to chronic blood loss 07/06/2013  . UTI (urinary tract infection)   . Obstruction of intestine or colon ([place]) 06/2014  . Diabetes mellitus without complication ([place]) 4/81/85    borderline type II; takes no medication for it  . Vision abnormalities 2016    not going blind but having retina problems; might lose ability to see faces  . LBBB (left bundle branch block)   . Asthma     hx of  . Depression   . CKD (chronic kidney disease), stage III     pt. states decreased kidney function  . Headache   . Fibromyalgia   . Achalasia     a. s/p botox injection for achalasia.  . Sinus bradycardia     a. HR 30s in 08/2014 requiring holding of digoxin.  . Family history of colon cancer   . Family history of bladder cancer   . Family history of breast cancer     has Left bundle branch block; Chest pain, exertional; Benign hypertensive heart disease without heart failure; Hypothyroid; Small bowel obstruction, partial ([place]); Dysphagia; Paroxysmal SVT (supraventricular tachycardia) ([place]); Ileostomy status ([place]); Unstable angina ([place]); Parastomal  hernia; Iron deficiency anemia due to chronic blood loss; Malabsorption of iron; Acute abdominal pain; Acute renal failure ([place]); Hyperglycemia; UTI (urinary tract infection); Small bowel obstruction ([place]); Abdominal pain, acute; Liver mass, left lobe; Colon cancer metastasized to liver [facility]); Liver lesion; Sinus bradycardia; Family history of colon cancer; Family history of bladder cancer; Family history of breast cancer; and Genetic testing on her problem list.     is allergic to bactrim; codeine; demerol; lidocaine hcl; morphine and related; dilaudid; nitrofurantoin monohyd macro; lisinopril; and ramipril.   Iron Infusion in March 2016.  Mammogram performed in 2015. Reported multiple hernias.   SURGICAL HISTORY: Past Surgical History  Procedure Laterality Date  . Cardiac catheterization  12/10/2000    THE LEFT VENTRICLE IS MILDY DILATED. THERE IS MILD TO MODERATE DIFFUSE HYPOKINESIS WITH EF 35%  . Colon cancer surgery    . Ileostomy    . Esophagogastroduodenoscopy  02/13/2011    Procedure: ESOPHAGOGASTRODUODENOSCOPY (EGD);  Surgeon: Rogene Houston, MD;  Location: [facility];  Service: Endoscopy;  Laterality: N/A;  1030  . Colonoscopy  09/26/2011    Procedure: COLONOSCOPY;  Surgeon: Rogene Houston, MD;  Location: [facility];  Service: Endoscopy;  Laterality: N/A;  215  . Cataract extraction w/phaco  11/13/2011    Procedure: CATARACT EXTRACTION PHACO AND INTRAOCULAR LENS PLACEMENT (IOC);  Surgeon: Tonny Branch, MD;  Location: [facility] ORS;  Service: Ophthalmology;  Laterality: Right;  CDE 12.26  . Cataract extraction w/phaco  12/08/2011    Procedure: CATARACT EXTRACTION PHACO AND INTRAOCULAR LENS PLACEMENT (IOC);  Surgeon: Tonny Branch, MD;  Location: [facility] OR[facility];  Service: Ophthalmology;  Laterality: Left;  CDE 15.11  . Esophagogastroduodenoscopy N/A 09/01/2013    Procedure: ESOPHAGOGASTRODUODENOSCOPY (EGD);  Surgeon: Rogene Houston, MD;  Location: [facility];  Service: Endoscopy;  Laterality: N/A;   830  . Botox injection N/A 09/01/2013    Procedure: BOTOX INJECTION;  Surgeon: Rogene Houston, MD;  Location: [facility];  Service: Endoscopy;  Laterality: N/A;  . Abdominal hysterectomy    . Cholecystectomy    . Joint replacement Left 2008  . Tonsillectomy    . Colon surgery    . Eye surgery  2014    catarac surgery  . Appendectomy    . Ovarian cystectomy 1955    . Ileostomy    . Coronary angioplasty    . Rotator cuff repair    . Breast surgery      right breast biopsy  . Liver biopsy and ablation  09/01/14    SOCIAL HISTORY: Social History   Social History  . Marital Status: Married    Spouse Name: N/A  . Number of Children: 4  . Years of Education: N/A   Occupational History  . Not on file.   Social History Main Topics  . Smoking status: Former Smoker -- 0.25 packs/day for 13 years    Types: Cigarettes    Quit date: 02/11/1963  . Smokeless tobacco: Never Used  . Alcohol Use: No  . Drug Use: No  . Sexual Activity: Not on file   Other Topics Concern  . Not on file   Social History Narrative   Reads inspirational books Has 33 Great-Grandchildren and 33 Grandchildren  Father died 25 Mother died 79 Oldest brother died 22 FAMILY HISTORY: Family History  Problem Relation Age of Onset  . Stroke    . Hypertension    . Heart disease Mother   . Stroke Mother     deceased  . Arthritis Mother   . Bladder Cancer Mother     dx in her 46s  . Colon cancer Mother   . Arthritis Father   . Heart disease Father     decesaed  . Leukemia Father   . Stroke Sister     alive/debilitated  . Hypertension Sister   . Other Sister     paralysis  . Heart disease Brother     bypass surgery  . Arthritis Brother   . Colon cancer Brother   . Bladder Cancer Brother   .  Stroke Sister     alive/debilitated  . Diabetes Sister   . Other Brother     stomach problems  . Other Brother     bladder  . Colon cancer Paternal Aunt   . Bladder Cancer Other     dx twice in her  30s-40s  . Breast cancer Other     great niece dx in her early 74s  . Prostate cancer Other     newphew  . Colon polyps Daughter      Review of Systems  Constitutional: Negative for fever, chills, weight loss and malaise/fatigue.  HENT: Positive for hearing loss. Negative for congestion, nosebleeds, sore throat and tinnitus.   Eyes: Negative for blurred vision, double vision, pain and discharge.  Respiratory: Negative for cough, hemoptysis, sputum production, shortness of breath and wheezing.   Cardiovascular: Negative for chest pain, palpitations, claudication, leg swelling and PND.  Gastrointestinal:  Negative for heartburn, abdominal pain, diarrhea, constipation, blood in stool and melena. ALTERED TASTE Genitourinary: Negative for dysuria, urgency, frequency and hematuria.  Musculoskeletal: Positive for joint pain. Negative for myalgias and falls.  Skin: Negative for itching and rash.  Neurological: Positive for weakness. Negative for dizziness, tingling, tremors, sensory change, speech change, focal weakness, seizures, loss of consciousness and headaches.  Endo/Heme/Allergies: Does not bruise/bleed easily.  Psychiatric/Behavioral: Negative for depression, suicidal ideas, memory loss and substance abuse. The patient is not nervous/anxious and does not have insomnia.   14 point review of systems was performed and is negative except as detailed under history of present illness and above   PHYSICAL EXAMINATION  ECOG PERFORMANCE STATUS: 1 - Symptomatic but completely ambulatory  Filed Vitals:   04/16/15 0918  BP: 109/50  Pulse: 70  Temp: 98.4 F (36.9 C)  Resp: 16    Physical Exam  Constitutional: She is oriented to person, place, and time and well-developed, well-nourished, and in no distress. In wheelchair. HENT:  Head: Normocephalic and atraumatic. R ear with small deformity from prior surgery Nose: Nose normal.  Mouth/Throat: Oropharynx is clear and moist. No  oropharyngeal exudate.  Eyes: Conjunctivae and EOM are normal. Pupils are equal, round, and reactive to light. Right eye exhibits no discharge. Left eye exhibits no discharge. No scleral icterus.  Neck: Normal range of motion. Neck supple. No tracheal deviation present. No thyromegaly present.  Cardiovascular: Normal rate, regular rhythm and normal heart sounds.  Exam reveals no gallop and no friction rub.   No murmur heard. Pulmonary/Chest: Effort normal and breath sounds normal. She has no wheezes. She has no rales.  Abdominal: Soft. Bowel sounds are normal. . There is no tenderness. There is no rebound and no guarding. ostomy site is intact with watery dark stool. Hernia noted. Incision sites all WNL Musculoskeletal: Normal range of motion. She exhibits no edema.  Lymphadenopathy:    She has no cervical adenopathy.  Neurological: She is alert and oriented to person, place, and time. She has normal reflexes. No cranial nerve deficit.  Skin: Skin is warm and dry. No rash noted.  Psychiatric: Mood, memory, affect and judgment normal.  Nursing note and vitals reviewed.   LABORATORY DATA: I have reviewed the data as listed. CBC    Component Value Date/Time   WBC 4.3 04/06/2015 0900   RBC 3.55* 04/06/2015 0900   RBC 3.45* 11/24/2008 1500   HGB 11.2* 04/06/2015 0900   HCT 33.4* 04/06/2015 0900   PLT 146* 04/06/2015 0900   MCV 94.1 04/06/2015 0900   MCH 31.5 04/06/2015  0900   MCHC 33.5 04/06/2015 0900   RDW 17.2* 04/06/2015 0900   LYMPHSABS 1.2 04/06/2015 0900   MONOABS 0.6 04/06/2015 0900   EOSABS 0.1 04/06/2015 0900   BASOSABS 0.0 04/06/2015 0900   CMP     Component Value Date/Time   NA 140 04/06/2015 0900   K 3.5 04/06/2015 0900   CL 107 04/06/2015 0900   CO2 24 04/06/2015 0900   GLUCOSE 119* 04/06/2015 0900   BUN 25* 04/06/2015 0900   CREATININE 1.35* 04/06/2015 0900   CREATININE 0.95* 12/07/2014 1512   CALCIUM 9.0 04/06/2015 0900   PROT 7.2 04/06/2015 0900   ALBUMIN  3.7 04/06/2015 0900   AST 20 04/06/2015 0900   ALT 11* 04/06/2015 0900   ALKPHOS 70 04/06/2015 0900   BILITOT 0.5 04/06/2015 0900   GFRNONAA 35* 04/06/2015 0900   GFRNONAA 56* 12/07/2014 1512   GFRAA 41* 04/06/2015 0900   GFRAA 64 12/07/2014 1512   Results for Wing, Rikayla H (MRN [id number]) as of 04/16/2015 13:00  Ref. Range 10/19/2014 10:45 12/07/2014 15:12 01/11/2015 14:30 03/07/2015 08:57 04/06/2015 12:26  CEA Latest Ref Range: 0.0-4.7 ng/mL 25.3 (H) 12.0 (H) 41.6 (H) 71.6 (H) 96.0 (H)     RADIOLOGY: I have reviewed the images detailed below and agree with the results:  Study Result     CLINICAL DATA: 82 year old female with history of colon cancer with metastatic disease to the liver. Currently undergoing chemotherapy. Followup study.  EXAM: CT CHEST, ABDOMEN, AND PELVIS WITH CONTRAST  TECHNIQUE: Multidetector CT imaging of the chest, abdomen and pelvis was performed following the standard protocol during bolus administration of intravenous contrast.  CONTRAST: 68m OMNIPAQUE IOHEXOL 300 MG/ML SOLN  COMPARISON: Multiple priors, most recently PET-CT 01/18/2015.  FINDINGS: CT CHEST FINDINGS  Mediastinum/Lymph Nodes: Heart size is mildly enlarged. There is no significant pericardial fluid, thickening or pericardial calcification. There is atherosclerosis of the thoracic aorta, the great vessels of the mediastinum and the coronary arteries, including calcified atherosclerotic plaque in the left anterior descending and left circumflex coronary arteries. Calcifications of the mitral annulus. No pathologically enlarged mediastinal or hilar lymph nodes. Mild circumferential thickening of the distal half of the esophagus. No discrete esophageal mass identified. No axillary lymphadenopathy.  Lungs/Pleura: A few scattered tiny sub cm subpleural nodules are noted, largest of which is a 3 mm nodule in the posterior aspect of the left lower lobe. These are unchanged,  presumably a benign subpleural lymph nodes. No other larger more suspicious appearing pulmonary nodules or masses are noted to suggest metastatic disease to the lungs. Mild scarring in the right lower lobe. No acute consolidative airspace disease. No pleural effusions.  Musculoskeletal/Soft Tissues: There are no aggressive appearing lytic or blastic lesions noted in the visualized portions of the skeleton.  CT ABDOMEN AND PELVIS FINDINGS  Hepatobiliary: Interval increase in size of a hypovascular heterogeneously enhancing lesion in segments 2 and 3 of the liver, which currently measures 3.1 x 2.0 x 2.3 cm (axial image 48 of series 7 and coronal image 21 of series 9). There is also a a 7 mm well-defined low-attenuation lesion in the central aspect of segment 7 of the liver, too small to definitively characterize, but similar to the prior study, likely tiny cysts. No new hepatic lesions are noted. No intra or extrahepatic biliary ductal dilatation. Status post cholecystectomy.  Pancreas: Fatty infiltration in the head of the pancreas, similar prior studies. No pancreatic mass. No pancreatic ductal dilatation. No pancreatic or peripancreatic fluid or inflammatory  changes.  Spleen: Unremarkable.  Adrenals/Urinary Tract: In the anterior aspect of the lower pole of the left kidney there is a well-circumscribed 1.1 x 1.4 cm lesion (image 33 of series 2) which measures 62 HU on precontrast images, 63 HU on arterial phase images, 79 HU on portal venous phase images, and 68 HU on delayed images. This lesion is only slightly larger than prior study 07/13/2014, at which point this measured 12 x 9 mm. 1.8 cm low-attenuation lesion in the interpolar region of the right kidney is compatible with a simple cyst. Sub cm low-attenuation lesion in the interpolar region of the left kidney is too small to definitively characterize, but is likely a cyst. No hydroureteronephrosis or perinephric  stranding. Urinary bladder is obscured by beam hardening artifact from the patient's left hip arthroplasty. Bilateral adrenal glands are normal in appearance.  Stomach/Bowel: Normal appearance of the stomach. No pathologic dilatation of small bowel or remaining colon. Status post right hemicolectomy with right lower quadrant ileostomy. Laxity of the lower right anterior abdominal wall. Small parastomal hernia containing several loops of small bowel. Notably, although the remaining portions of transverse, descending, sigmoid colon and rectum are decompressed, in the left lower quadrant (image 85 of series 7) there is a new soft tissue mass which appears to be intimately associated with the colon at the junction of descending colon and sigmoid colon, which measures approximately 2.9 x 4.1 x 4.5 cm, concerning for new colonic neoplasm. The possibility of a peritoneal implant in this region is not entirely excluded.  Vascular/Lymphatic: Atherosclerosis throughout the abdominal and pelvic vasculature, without evidence of aneurysm or dissection. No lymphadenopathy noted in the abdomen or pelvis.  Reproductive: Status post hysterectomy. Ovaries are not confidently identified may be surgically absent or atrophic (much of the pelvis is obscured by beam hardening artifact from the patient's left hip prosthesis).  Other: No significant volume of ascites. No pneumoperitoneum.  Musculoskeletal: Status post left total hip arthroplasty. There are no aggressive appearing lytic or blastic lesions noted in the visualized portions of the skeleton.  IMPRESSION: 1. Progression of disease as evidenced by enlargement of the metastatic lesion in segments 2/3 of the liver. 2. In addition, there has been interval development of a 2.9 x 4.1 x 4.5 cm mass which appears intimately associated with the remaining portion of the colon at the junction of the descending colon and sigmoid colon. This  appears to arise from this portion of the colon and is favored to represent a new colonic neoplasm. The possibility of a solitary peritoneal implant in this region is not entirely excluded. This could be further evaluated with colonoscopy if clinically appropriate. 3. No signs of metastatic disease to the thorax. 4. 1.1 x 1.4 cm indeterminate lesion in the lower pole of the left kidney. This is favored to represent a proteinaceous cyst with apparent pseudoenhancement on today's examination secondary to the small size of the lesion. However, this lesion was mildly complex on prior MRI of the abdomen 12/08/2014. Strictly speaking, the possibility of a small slow-growing cystic neoplasm is not entirely excluded. Close attention on followup studies is recommended. 5. Circumferential thickening of the distal half of the esophagus. This may simply reflect esophagitis, however, if there is any clinical concern for Barrett's metaplasia or esophageal neoplasia, further evaluation with endoscopy should be considered. 6. Mild cardiomegaly. 7. Atherosclerosis, including 2 vessel coronary artery disease. 8. Additional incidental findings, as above.   Electronically Signed  By: Vinnie Langton M.D.  On: 04/12/2015 15:18  ASSESSMENT and THERAPY PLAN:  Colon carcinoma with permanent ostomy Diversion Colitis Achalasia Iron deficiency, anemia secondary to GI related blood loss Hemeoccult positive stools Rising CEA with CT imaging on 07/13/2014 1.3 cm mass in L hepatic lobe Subcapsular mass in anterior pole of L kidney suspicious for RCC Biopsy of L liver lesion and MWA Left liver lesion 09/01/2014 Taste Alteration KRAS WT XELODA therapy, week on, week off started 01/2015 Progressive disease Difficulty discussing end of life decision making   We again discussed end of life decision making.  The patient has great difficulty discussing this. I am considering sending her to an outpatient  palliative care consultation with Tasha. Her daughter has contacted Hildred Alamin multiple times with concerns about the patient's unwillingness to accept death/dying.   We discussed Vectibix. She will take this once every 2 weeks. She is still very interested in pursuing therapy. Hildred Alamin is going to do chemotherapy teaching today.  I reccommended that she stop taking Xeloda since her disease has progressed.   I increased her Fentanyl patch to 25 mcg. I have advised her that we will slowly increase the dose of her patch over the next several weeks.    Orders Placed This Encounter  Procedures  . CBC with Differential    Standing Status: Standing     Number of Occurrences: 12     Standing Expiration Date: 04/15/2016  . Comprehensive metabolic panel    Standing Status: Standing     Number of Occurrences: 12     Standing Expiration Date: 04/15/2016  . Magnesium    Standing Status: Standing     Number of Occurrences: 12     Standing Expiration Date: 04/15/2016  . CEA    Standing Status: Standing     Number of Occurrences: 7     Standing Expiration Date: 04/15/2016  . Ambulatory Referral to Palliative Care    Referral Priority:  Routine    Referral Type:  Consultation    Referral Reason:  Specialty Services Required    Number of Visits Requested:  1   All questions were answered. The patient knows to call the clinic with any problems, questions or concerns. We can certainly see the patient much sooner if necessary.   This document was electronically signed.   This document serves as a record of services personally performed by Ancil Linsey, MD. It was created on her behalf by Kandace Blitz, a trained medical scribe. The creation of this record is based on the scribe's personal observations and the provider's statements to them. This document has been checked and approved by the attending provider.  I have reviewed the above documentation for accuracy and completeness, and I agree with the  above.  Kelby Fam. Dafina Suk, MD  Review of Systems

## 2015-04-16 NOTE — Progress Notes (Signed)
Chemo teaching done and consent signed for Vectibix. Teaching instructions given to patient and 2 daughters. Skin care kit given to patient also. Patient to start Friday 04/20/15.

## 2015-04-17 ENCOUNTER — Encounter (HOSPITAL_COMMUNITY): Payer: Self-pay | Admitting: *Deleted

## 2015-04-17 ENCOUNTER — Encounter: Payer: Self-pay | Admitting: *Deleted

## 2015-04-17 DIAGNOSIS — G893 Neoplasm related pain (acute) (chronic): Secondary | ICD-10-CM | POA: Insufficient documentation

## 2015-04-17 DIAGNOSIS — Z7189 Other specified counseling: Secondary | ICD-10-CM | POA: Insufficient documentation

## 2015-04-17 NOTE — Progress Notes (Signed)
[place] Clinical Social Work  Clinical Social Work was referred by Futures trader for assessment of psychosocial needs due to cancer progression, difficulty processing end of life concerns. Clinical Social Worker attempted to contact patient at home to offer support and assess for needs.  CSW left supportive message explaining role of CSW and how CSW could further be a resource. Pt appears to come on Fridays, but CSW suggested pt could meet with pt on a Tuesday for additional support.     Clinical Social Work interventions: Supportive message   Loren Racer, [place] Tuesdays   [phone]

## 2015-04-20 ENCOUNTER — Encounter (HOSPITAL_BASED_OUTPATIENT_CLINIC_OR_DEPARTMENT_OTHER): Payer: Medicare Other | Admitting: Primary Care

## 2015-04-20 ENCOUNTER — Encounter (HOSPITAL_BASED_OUTPATIENT_CLINIC_OR_DEPARTMENT_OTHER): Payer: Medicare Other

## 2015-04-20 VITALS — BP 117/45 | HR 76 | Temp 98.0°F | Resp 16 | Wt 148.0 lb

## 2015-04-20 DIAGNOSIS — D509 Iron deficiency anemia, unspecified: Secondary | ICD-10-CM | POA: Diagnosis not present

## 2015-04-20 DIAGNOSIS — Z515 Encounter for palliative care: Secondary | ICD-10-CM | POA: Diagnosis not present

## 2015-04-20 DIAGNOSIS — C189 Malignant neoplasm of colon, unspecified: Secondary | ICD-10-CM | POA: Diagnosis not present

## 2015-04-20 DIAGNOSIS — C787 Secondary malignant neoplasm of liver and intrahepatic bile duct: Secondary | ICD-10-CM

## 2015-04-20 DIAGNOSIS — Z7189 Other specified counseling: Secondary | ICD-10-CM

## 2015-04-20 DIAGNOSIS — Z5112 Encounter for antineoplastic immunotherapy: Secondary | ICD-10-CM | POA: Diagnosis present

## 2015-04-20 LAB — CBC WITH DIFFERENTIAL/PLATELET
BASOS ABS: 0 10*3/uL (ref 0.0–0.1)
Basophils Relative: 1 %
EOS PCT: 3 %
Eosinophils Absolute: 0.2 10*3/uL (ref 0.0–0.7)
HEMATOCRIT: 33.9 % — AB (ref 36.0–46.0)
HEMOGLOBIN: 11.7 g/dL — AB (ref 12.0–15.0)
LYMPHS ABS: 1.1 10*3/uL (ref 0.7–4.0)
LYMPHS PCT: 25 %
MCH: 33.1 pg (ref 26.0–34.0)
MCHC: 34.5 g/dL (ref 30.0–36.0)
MCV: 95.8 fL (ref 78.0–100.0)
MONO ABS: 0.7 10*3/uL (ref 0.1–1.0)
MONOS PCT: 16 %
Neutro Abs: 2.4 10*3/uL (ref 1.7–7.7)
Neutrophils Relative %: 55 %
Platelets: 159 10*3/uL (ref 150–400)
RBC: 3.54 MIL/uL — ABNORMAL LOW (ref 3.87–5.11)
RDW: 17.9 % — ABNORMAL HIGH (ref 11.5–15.5)
WBC: 4.4 10*3/uL (ref 4.0–10.5)

## 2015-04-20 LAB — COMPREHENSIVE METABOLIC PANEL
ALBUMIN: 4 g/dL (ref 3.5–5.0)
ALK PHOS: 67 U/L (ref 38–126)
ALT: 13 U/L — ABNORMAL LOW (ref 14–54)
AST: 22 U/L (ref 15–41)
Anion gap: 11 (ref 5–15)
BILIRUBIN TOTAL: 0.6 mg/dL (ref 0.3–1.2)
BUN: 27 mg/dL — AB (ref 6–20)
CALCIUM: 9.3 mg/dL (ref 8.9–10.3)
CO2: 25 mmol/L (ref 22–32)
Chloride: 105 mmol/L (ref 101–111)
Creatinine, Ser: 1.31 mg/dL — ABNORMAL HIGH (ref 0.44–1.00)
GFR calc Af Amer: 42 mL/min — ABNORMAL LOW (ref >60)
GFR calc non Af Amer: 37 mL/min — ABNORMAL LOW (ref >60)
GLUCOSE: 127 mg/dL — AB (ref 65–99)
POTASSIUM: 3.5 mmol/L (ref 3.5–5.1)
SODIUM: 141 mmol/L (ref 135–145)
TOTAL PROTEIN: 7.3 g/dL (ref 6.5–8.1)

## 2015-04-20 LAB — URINALYSIS, ROUTINE W REFLEX MICROSCOPIC
BILIRUBIN URINE: NEGATIVE
GLUCOSE, UA: NEGATIVE mg/dL
Hgb urine dipstick: NEGATIVE
KETONES UR: NEGATIVE mg/dL
Leukocytes, UA: NEGATIVE
NITRITE: NEGATIVE
PH: 5.5 (ref 5.0–8.0)
PROTEIN: NEGATIVE mg/dL
Specific Gravity, Urine: 1.02 (ref 1.005–1.030)

## 2015-04-20 LAB — MAGNESIUM: Magnesium: 1.9 mg/dL (ref 1.7–2.4)

## 2015-04-20 MED ORDER — SODIUM CHLORIDE 0.9 % IV SOLN
Freq: Once | INTRAVENOUS | Status: AC
  Administered 2015-04-20: 10:00:00 via INTRAVENOUS

## 2015-04-20 MED ORDER — SODIUM CHLORIDE 0.9 % IV SOLN
6.0000 mg/kg | Freq: Once | INTRAVENOUS | Status: AC
  Administered 2015-04-20: 400 mg via INTRAVENOUS
  Filled 2015-04-20: qty 20

## 2015-04-20 MED ORDER — SODIUM CHLORIDE 0.9% FLUSH: 10.0000 mL | INTRAVENOUS | Status: DC | PRN

## 2015-04-20 NOTE — Patient Instructions (Signed)
[facility] Discharge Instructions for Patients Receiving Chemotherapy   Beginning January 23rd 2017 lab work for the [facility] will be done in the  Main lab at [facility] on 1st floor. If you have a lab appointment with the [facility] please come in thru the  Main Entrance and check in at the main information desk   Today you received the following chemotherapy agents:  Vectibix  If you develop nausea and vomiting, or diarrhea that is not controlled by your medication, call the clinic.  The clinic phone number is [phone]. Office hours are Monday-Friday 8:30am-5:00pm.  BELOW ARE SYMPTOMS THAT SHOULD BE REPORTED IMMEDIATELY:  *FEVER GREATER THAN 101.0 F  *CHILLS WITH OR WITHOUT FEVER  NAUSEA AND VOMITING THAT IS NOT CONTROLLED WITH YOUR NAUSEA MEDICATION  *UNUSUAL SHORTNESS OF BREATH  *UNUSUAL BRUISING OR BLEEDING  TENDERNESS IN MOUTH AND THROAT WITH OR WITHOUT PRESENCE OF ULCERS  *URINARY PROBLEMS  *BOWEL PROBLEMS  UNUSUAL RASH Items with * indicate a potential emergency and should be followed up as soon as possible. If you have an emergency after office hours please contact your primary care physician or go to the nearest emergency department.  Please call the clinic during office hours if you have any questions or concerns.   You may also contact the Patient Navigator at [phone] should you have any questions or need assistance in obtaining follow up care.      Resources For Cancer Patients and their Caregivers ? American Cancer Society: Can assist with transportation, wigs, general needs, runs Look Good Feel Better.        276-575-8555 ? Cancer Care: Provides financial assistance, online support groups, medication/co-pay assistance.  1-800-813-HOPE 306-196-5690) ? [facility] Assists [place] cancer patients and their families through emotional , educational and financial support.   (619) 715-1512 ? [place] DSS Where to apply for food stamps, Medicaid and utility assistance. 7657605616 ? [employer]: Transportation to medical appointments. 2176120602 ? Social Security Administration: May apply for disability if have a Stage IV cancer. (754) 207-5800 (586)723-1476 ? [employer], Disability and Transit Services: Assists with nutrition, care and transit needs. 5166959296        Panitumumab Solution for Injection What is this medicine? PANITUMUMAB (pan i TOOM ue mab) is a monoclonal antibody. It is used to treat colorectal cancer. This medicine may be used for other purposes; ask your health care provider or pharmacist if you have questions. What should I tell my health care provider before I take this medicine? They need to know if you have any of these conditions: -lung disease, especially lung fibrosis -skin conditions or sensitivity -an unusual or allergic reaction to panitumumab, mouse proteins, other medicines, foods, dyes, or preservatives -pregnant or trying to get pregnant -breast-feeding How should I use this medicine? This drug is given as an infusion into a vein. It is administered in a hospital or clinic by a specially trained health care professional. Talk to your pediatrician regarding the use of this medicine in children. Special care may be needed. Overdosage: If you think you have taken too much of this medicine contact a poison control center or emergency room at once. NOTE: This medicine is only for you. Do not share this medicine with others. What if I miss a dose? It is important not to miss your dose. Call your doctor or health care professional if you are unable to keep an appointment. What may interact with this medicine? -some  medicines for cancer This list may not describe all possible interactions. Give your health care provider a list of all the medicines, herbs, non-prescription drugs, or dietary supplements you use. Also  tell them if you smoke, drink alcohol, or use illegal drugs. Some items may interact with your medicine. What should I watch for while using this medicine? Visit your doctor for checks on your progress. This drug may make you feel generally unwell. This is not uncommon, as chemotherapy can affect healthy cells as well as cancer cells. Report any side effects. Continue your course of treatment even though you feel ill unless your doctor tells you to stop. This medicine can make you more sensitive to the sun. Keep out of the sun while receiving this medicine and for 2 months after the last dose. If you cannot avoid being in the sun, wear protective clothing and use sunscreen. Do not use sun lamps or tanning beds/booths. In some cases, you may be given additional medicines to help with side effects. Follow all directions for their use. Call your doctor or health care professional for advice if you get a fever, chills or sore throat, or other symptoms of a cold or flu. Do not treat yourself. This drug decreases your body's ability to fight infections. Try to avoid being around people who are sick. Avoid taking products that contain aspirin, acetaminophen, ibuprofen, naproxen, or ketoprofen unless instructed by your doctor. These medicines may hide a fever. Do not become pregnant while taking this medicine and for 6 months after the last dose. Women should inform their doctor if they wish to become pregnant or think they might be pregnant. Men should not father a child while taking this medicine and for 6 months after the last dose. There is a potential for serious side effects to an unborn child. Talk to your health care professional or pharmacist for more information. Do not breast-feed an infant while taking this medicine. What side effects may I notice from receiving this medicine? Side effects that you should report to your doctor or health care professional as soon as possible: -allergic reactions like  skin rash, itching or hives, swelling of the face, lips, or tongue -breathing problems -changes in vision -fast, irregular heartbeat -feeling faint or lightheaded, falls -fever, chills -mouth sores -swelling of the ankles, feet, hands -unusually weak or tired Side effects that usually do not require medical attention (report to your doctor or health care professional if they continue or are bothersome): -changes in skin like acne, cracks, skin dryness -constipation -diarrhea -eyelash growth -headache -nail changes -nausea, vomiting -stomach upset This list may not describe all possible side effects. Call your doctor for medical advice about side effects. You may report side effects to FDA at 1-800-FDA-1088. Where should I keep my medicine? This drug is given in a hospital or clinic and will not be stored at home. NOTE: This sheet is a summary. It may not cover all possible information. If you have questions about this medicine, talk to your doctor, pharmacist, or health care provider.    2016, Elsevier/Gold Standard. (2014-03-28 17:21:33)

## 2015-04-20 NOTE — Progress Notes (Signed)
Patient ID: Karen [name], female   DOB: [birth year], 82 y.o.   MRN: [id number]                                        @PC @                                   Consultation Note Date: 04/20/2015   Patient Name: Karen [name]  DOB: 06-10-1929  MRN: [id number]  Age / Sex: 82 y.o., female  PCP: [name], MD Referring Physician: Drue Novel, NP  Reason for Consultation: Establishing goals of care and Psychosocial/spiritual support    Clinical Assessment/Narrative: Karen [name] is sitting up in a chair getting ready to take her IV chemotherapy. Her daughter Karen [name] is at bedside.  We talk about her symptom management, and she shares that her feet have been giving her problems. Upon examination she seems to have an ingrown toenail on her right foot and the beginnings of another on her left. Karen [name] agrees to make an outpatient appointment with Dr. Berline Lopes, podiatrist, in [place]. She talks about her recent addition of pain patch and that she has not been taking her Xanax or pain pill because she was worried about the effects of the new pain patch. She says that she has restarted her Xanax and after discussion, she feels comfortable restarting her pain pills as needed.  She shares some of her life story; she was married at age 78 and had her 1st child 10 months later. She and her husband have been married for 50 years. 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  She shares with me her recent medical history (extensive).  We talk about her abdomen and hip pain. We also talk about her decreased mobility due to hip pain. We talk about her activities of daily living, and she tells me that she continues to do her own cooking and cleaning. We discussed functional status and functional decline.  We discuss asking/allowing others to help her, not only to benefit her, but also to make them feel wanted and needed. She shares some of her struggles with eating. Including achalasia, and her struggles with food choices  and bowel obstructions.   I share the chronic illness trajectory with them.  We talk about her husband. She states that he has been active most of his life but had a heart attack in 1991. That he worked many physically demanding jobs, but recently, he has been sitting in his chair and not participating with housework. She talks about making an appointment for both of them with the cardiologist. She shares that he tells her that he is worried about her. Unfortunately, her response is "don't worry".  I encouraged her, and Karen [name], to open dialogue about his worries. I encourage them to share their own worries first, and then ask him what is worrying him.   Karen [name] speaks several times about her faith.  She also talks at length about her family. She says, "I have been blessed with a loving family", and she shares some photographs.  Karen [name] shares that her goal is to be able to pass away at home. Daughter Karen [name] states emphatically that neither one of her parents will be placed in a nursing home. I talk about end-of-life choices, and Karen [name]  said that she prefers not to have hospice, but that a longtime childhood friend recently shared her experiences with hospice, and that at this point, she's not ready for this service, but that she will wait and see.  Karen [name] shares that her husband "thinks I'm going to get well". She shares that, in the past, "the Lord healed me". I ask her, "what do you think"? She shares that his she has been praying and looking for an answer. We talk about healing taking many forms. She states that she knows she will be healed whether it is here or in heaven. "No more pain or suffering".  Karen [name] shares that her worry about who will look after her husband of 7 years. She goes on to talk about her mothers many life-threatening illnesses and how many times they thought she would die, but she did not.  She states, "she would come out of it every time", and that her  father's shared with her, "be careful what you pray for" because her father had prayed that morning that if her mother could not be healed, for God to just take her, and that's what happened.    Karen [name] agrees to meet with me again in 1 month.  Contacts/Participants in Discussion:  Primary Decision Maker: Karen [name] is able to make her own decisions. She shares that she and her husband, 2 weeks ago, completed her healthcare power of attorney, wills and power of attorney paperwork.  She states that her healthcare power of attorney is her husband Karen [name], then her daughter Karen [name].   Relationship to Patient as above HCPOA: yes  husband Karen [name], then daughter Karen [name].  SUMMARY OF RECOMMENDATIONS  Code Status/Advance Care Planning: Full code Code Status History    Date Active Date Inactive Code Status Order ID Comments User Context   09/01/2014  4:14 PM 09/02/2014  1:15 PM Full Code [id number]  [name], MD Inpatient   06/15/2014  1:01 PM 06/16/2014  6:23 PM Full Code [id number]  Karen Gunning, NP Inpatient      Other Directives:None  Karen [name] shares that she is unsure if she would be willing to accept any other surgeries. She shares that one physician told her that she would not survive another surgery but another says that that was during the time that she was critically ill, and that is no longer the case.  I ask her about a PEG tube for feeding she is unable to swallow, and she shares that she would be willing to take a peg tube. We discuss the concept of PEG tube if her quality of life were to be less than it is now, and allowing her husband and daughter to make those decisions at that time. She talks about her sister who had a stroke and lived 22 years (had a peg tube). She goes on to share stories about her siblings who were still alive and active in their mid-28s.  Symptom Management:   per oncologist.  Palliative Prophylaxis:   Per oncologist.  Additional Recommendations  (Limitations, Scope, Preferences):  Karen [name] shares that she was on life support in the past and was able to recover relatively quickly.  she shares that she would like to have a trial of life support again if needed. She shares that if she is "brain-dead" or there is "no hope" that she would understand not continuing life support. At this point she begins talking about her father's illness near end of life, he was  80 years old and he passed.  Psycho-social/Spiritual:  Support System: Strong Desire for further Chaplaincy support: not discussed today Additional Recommendations: Caregiving  Support/Resources, Education on Hospice and Grief/Bereavement Support  Prognosis: Unable to determine, based on outcomes.  Discharge Planning: Home, continuing with outpatient chemotherapy   Chief Complaint/ Primary Diagnoses: @PROBSPOA @  I have reviewed the medical record, interviewed the patient and family, and examined the patient. The following aspects are pertinent.  Past Medical History  Diagnosis Date  . Chronic diastolic CHF (congestive heart failure) (HCC)     a. nl EF by echo 05/2009.  Marland Kitchen DVT of deep femoral vein ([place])   . History of PSVT (paroxysmal supraventricular tachycardia)   . Hypertension   . Abdominal adhesions   . Ileostomy present ([place])   . Colon polyp   . Chest pain     a. 2002 Cath: nl cors;  b. 2009 aden mv: nl;  c. 05/2009 Echo: nl. d. 2014: normal nuclear stress test, EF 69%.  . Wears dentures   . Breast lump   . Hyperlipidemia   . Thyroid disease   . Anemia   . History of blood clots   . Hernia   . Blood transfusion   . Hearing loss   . Arthritis   . GERD (gastroesophageal reflux disease)   . Cancer ([place])     bladder  . Colon cancer ([place])     a. recurrence 2016 with liver mets -  percutaneous liver biopsy and thermal ablation of a liver metastasis by interventional radiology..  . Iron deficiency anemia due to chronic blood loss 07/06/2013  . UTI (urinary tract  infection)   . Obstruction of intestine or colon ([place]) 06/2014  . Diabetes mellitus without complication ([place]) 123456    borderline type II; takes no medication for it  . Vision abnormalities 2016    not going blind but having retina problems; might lose ability to see faces  . LBBB (left bundle branch block)   . Asthma     hx of  . Depression   . CKD (chronic kidney disease), stage III     pt. states decreased kidney function  . Headache   . Fibromyalgia   . Achalasia     a. s/p botox injection for achalasia.  . Sinus bradycardia     a. HR 30s in 08/2014 requiring holding of digoxin.  . Family history of colon cancer   . Family history of bladder cancer   . Family history of breast cancer    Social History   Social History  . Marital Status: Married    Spouse Name: N/A  . Number of Children: 4  . Years of Education: N/A   Social History Main Topics  . Smoking status: Former Smoker -- 0.25 packs/day for 13 years    Types: Cigarettes    Quit date: 02/11/1963  . Smokeless tobacco: Never Used  . Alcohol Use: No  . Drug Use: No  . Sexual Activity: Not on file   Other Topics Concern  . Not on file   Social History Narrative   Family History  Problem Relation Age of Onset  . Stroke    . Hypertension    . Heart disease Mother   . Stroke Mother     deceased  . Arthritis Mother   . Bladder Cancer Mother     dx in her 70s  . Colon cancer Mother   . Arthritis Father   . Heart disease Father  decesaed  . Leukemia Father   . Stroke Sister     alive/debilitated  . Hypertension Sister   . Other Sister     paralysis  . Heart disease Brother     bypass surgery  . Arthritis Brother   . Colon cancer Brother   . Bladder Cancer Brother   . Stroke Sister     alive/debilitated  . Diabetes Sister   . Other Brother     stomach problems  . Other Brother     bladder  . Colon cancer Paternal Aunt   . Bladder Cancer Other     dx twice in her 30s-40s  . Breast  cancer Other     great niece dx in her early 58s  . Prostate cancer Other     newphew  . Colon polyps Daughter    Scheduled Meds: Continuous Infusions: PRN Meds:. Medications Prior to Admission:  Prior to Admission medications   Medication Sig Start Date End Date Taking? Authorizing Provider  ALPRAZolam Duanne Moron) 0.5 MG tablet Take 0.5 mg by mouth at bedtime.     Historical Provider, MD  amitriptyline (ELAVIL) 10 MG tablet Take 20 mg by mouth at bedtime.     Historical Provider, MD  aspirin EC 325 MG tablet Take 325 mg by mouth daily.    Historical Provider, MD  Calcium Carbonate (CALTRATE 600 PO) Take by mouth daily.    Historical Provider, MD  carvedilol (COREG) 12.5 MG tablet Take 1 tablet (12.5 mg total) by mouth 2 (two) times daily with a meal. 12/06/14   Dayna N Dunn, PA-C  fentaNYL (DURAGESIC - DOSED MCG/HR) 25 MCG/HR patch Place 1 patch (25 mcg total) onto the skin every 3 (three) days. 04/16/15   Patrici Ranks, MD  HYDROcodone-acetaminophen (NORCO) 7.5-325 MG tablet Take 1 tablet by mouth every 4 (four) hours as needed.  12/08/14   Historical Provider, MD  hydrocortisone (CORTENEMA) 100 MG/60ML enema Place 1 enema (100 mg total) rectally at bedtime. 01/30/15   Rogene Houston, MD  hyoscyamine (LEVSIN SL) 0.125 MG SL tablet Place 1 tablet (0.125 mg total) under the tongue every 6 (six) hours as needed (lower abdominal pain). 09/11/14   Rogene Houston, MD  ketoconazole (NIZORAL) 2 % cream Apply 1 application topically daily. To fungal area on toe    Historical Provider, MD  levothyroxine (SYNTHROID, LEVOTHROID) 75 MCG tablet Take 75 mcg by mouth daily.     Historical Provider, MD  losartan-hydrochlorothiazide (HYZAAR) 100-12.5 MG per tablet Take by mouth as directed. 1/2 TABLET DAILY. DO NOT TAKE IF YOUR TOP BLOOD PRESSURE NUMBER IS LESS THAN 110    Historical Provider, MD  Multiple Vitamins-Minerals (EQL GUMMY ADULT) CHEW Chew 2 tablets by mouth daily. Reported on 02/06/2015     Historical Provider, MD  NITROSTAT 0.4 MG SL tablet DISSOLVE ONE TABLET UNDER THE TONGUE EVERY 5 MINUTES AS NEEDED FOR CHEST PAIN.  DO NOT EXCEED A TOTAL OF 3 DOSES IN 15 MINUTES 11/15/13   Darlin Coco, MD  nystatin (MYCOSTATIN/NYSTOP) 100000 UNIT/GM POWD Apply 1 Bottle topically 2 (two) times daily. 03/07/15   Patrici Ranks, MD  nystatin-triamcinolone (MYCOLOG II) cream Apply 1 application topically 2 (two) times daily. 03/20/15   Rogene Houston, MD  Omega-3 Fatty Acids (FISH OIL) 1000 MG CAPS Take 1 capsule by mouth daily. Reported on 02/19/2015    Historical Provider, MD  ondansetron (ZOFRAN-ODT) 4 MG disintegrating tablet Take 1 tablet (4 mg total) by mouth every  8 (eight) hours as needed for nausea or vomiting. 03/07/15   Patrici Ranks, MD  Panitumumab (VECTIBIX IV) Inject into the vein. To start 04/20/15. To be given every 2 weeks.    Historical Provider, MD  pantoprazole (PROTONIX) 40 MG tablet Take 40 mg by mouth daily.     Historical Provider, MD  triamcinolone cream (KENALOG) 0.1 %  03/23/15   Historical Provider, MD   Allergies  Allergen Reactions  . Bactrim [Sulfamethoxazole-Trimethoprim] Shortness Of Breath and Other (See Comments)    Hurting all over  . Codeine Palpitations and Other (See Comments)    Heart races  . Demerol Palpitations and Other (See Comments)    tachycardia  . Lidocaine Hcl Other (See Comments)    tachycardia  . Morphine And Related Palpitations    Tachycardia   . Dilaudid [Hydromorphone Hcl] Other (See Comments)    Hallucinations  . Nitrofurantoin Monohyd Macro Other (See Comments)    Unknown   . Lisinopril Cough  . Ramipril Cough    Review of Systems  Physical Exam  Vital Signs: There were no vitals taken for this visit.  SpO2:   O2 Device:  O2 Flow Rate: .   IO: Intake/output summary: @IOBRIEF @  LBM:   Baseline Weight:   Most recent weight:        Palliative Assessment/Data:    Additional Data Reviewed:  CBC:    Component  Value Date/Time   WBC 4.4 04/20/2015 1014   HGB 11.7* 04/20/2015 1014   HCT 33.9* 04/20/2015 1014   PLT 159 04/20/2015 1014   MCV 95.8 04/20/2015 1014   NEUTROABS 2.4 04/20/2015 1014   LYMPHSABS 1.1 04/20/2015 1014   MONOABS 0.7 04/20/2015 1014   EOSABS 0.2 04/20/2015 1014   BASOSABS 0.0 04/20/2015 1014   Comprehensive Metabolic Panel:    Component Value Date/Time   NA 141 04/20/2015 1014   K 3.5 04/20/2015 1014   CL 105 04/20/2015 1014   CO2 25 04/20/2015 1014   BUN 27* 04/20/2015 1014   CREATININE 1.31* 04/20/2015 1014   CREATININE 0.95* 12/07/2014 1512   GLUCOSE 127* 04/20/2015 1014   CALCIUM 9.3 04/20/2015 1014   AST 22 04/20/2015 1014   ALT 13* 04/20/2015 1014   ALKPHOS 67 04/20/2015 1014   BILITOT 0.6 04/20/2015 1014   PROT 7.3 04/20/2015 1014   ALBUMIN 4.0 04/20/2015 1014     Time In: 0930 Time Out: 1145 Time Total: 135 minutes Greater than 50%  of this time was spent counseling and coordinating care related to the above assessment and plan.  Signed by: Drue Novel, NP  Drue Novel, NP  04/20/2015, 2:40 PM  Please contact Palliative Medicine Team phone at [phone] for questions and concerns.

## 2015-04-20 NOTE — Progress Notes (Signed)
1215:  Tolerated infusion w/o adverse reaction.  VSS.  Discharged via wheelchair in c/o family for transport home.

## 2015-04-21 LAB — CEA: CEA: 109.6 ng/mL — ABNORMAL HIGH (ref 0.0–4.7)

## 2015-04-23 ENCOUNTER — Telehealth (HOSPITAL_COMMUNITY): Payer: Self-pay | Admitting: *Deleted

## 2015-04-23 NOTE — Telephone Encounter (Signed)
Spoke with patient. She reports she doesn't "think" she had any problems from the chemo. She reports she has so many things that go on with her but she didn't have anything Karen [name] or unusual. Denies any issues/concerns that need addressing with MD. Encouraged to call clinic if needed.

## 2015-04-26 DIAGNOSIS — M79672 Pain in left foot: Secondary | ICD-10-CM | POA: Diagnosis not present

## 2015-04-26 DIAGNOSIS — L03032 Cellulitis of left toe: Secondary | ICD-10-CM | POA: Diagnosis not present

## 2015-04-26 DIAGNOSIS — L600 Ingrowing nail: Secondary | ICD-10-CM | POA: Diagnosis not present

## 2015-04-26 DIAGNOSIS — M79675 Pain in left toe(s): Secondary | ICD-10-CM | POA: Diagnosis not present

## 2015-04-26 NOTE — Assessment & Plan Note (Addendum)
Stage IV CRC, KRAS wild-type, S/P microwave ablation of a solitary liver lesion on 09/01/2014.  S/P palliative Xeloda 1500 mg BID 7 days on and 7 days off beginning 01/2015 with progression of fisease identified in March 2017 resulting in a discontinuation in [place] therapy.  Now on Vectibix every 2 weeks beginning on 04/20/2015.  Oncology history updated.  TODAY IS A NADIR CHECK after being treated for cycle #1 last week.  Se will return next week for cycle #2 of single-agent Vectibix.  She is having difficulty with her mortality and is S/P outpatient consultation with palliative medicine on 04/20/2015.  She notes new back pain this is likely a muscle spasm of latissiumus based upon description of patient.  She is interested in having plain film done today as this is a new symptom as of early this AM.  Orders are placed for DG L-spine and thoracic spine.  We will keep her apprised of results.    She notes worsening pain of her neck in the setting of previously diagnosed "pinched nerve" managed by Dr. Christella Noa (neurosurgery) via physical therapy.    She has a fluid filled cyst on the volar aspect of wrist, radial aspect, that is bilobed, nontender, nonerythematous.  Suspect ganglion cyst despite on the less common aspect of wrist.  Observation only is recommended.  She will return for follow-up in 3 weeks with cycle #3 of treatment.

## 2015-04-26 NOTE — Progress Notes (Signed)
Karen Hilding, MD [address] [place] [address]  Colon cancer metastasized to liver [facility])  Right-sided thoracic back pain - Plan: DG Lumbar Spine Complete, DG Thoracic Spine 2 View  CURRENT THERAPY: Vectibix single-agent every 2 weeks.  INTERVAL HISTORY: Karen [name] 82 y.o. female returns for followup of stage IV CRC s/p microwave ablation of a solitary liver lesion. Unfortunately repeat imaging in December revealed other liver disease and recurrence at the prior anastomotic site. She also has a L renal lesion that is under observation. She has other health issues including chronic LBP. She has been on XELODA.     Colon cancer metastasized to liver [facility])   04/12/1987 Definitive Surgery Dr. Lindalou Hose at [facility]   09/01/2014 Pathology Results Liver, needle/core biopsy - METASTATIC ADENOCARCINOMA.   09/01/2014 Miscellaneous MWA left liver lesion, [name] (IR)   01/18/2015 Progression PET scan   01/18/2015 PET scan Recurrence of  tumor within segment 2 of the liver. There are 2 new lesions identified within segment 5 and segment 6 of the liver worrisome for additional sites of metastasis. Evidence of tumor recurrence at the intra colonic anastomosis is noted...   01/28/2015 - 04/16/2015 Chemotherapy Xeloda 1500 mg BID 7 days on and 7 days off beginning in December 2016.   04/12/2015 Imaging CT C/A/P progression of disease, enlargement of metastatic lesion in segments 2/3 of liver, development of mass with colon at junction of descending colon and sigmoid, indeterminate lesion in lower pole of the left kidney   04/20/2015 -  Antibody Plan Vectibix every 2 weeks, single-agent.    TODAY IS A NADIR CHECK APPOINTMENT  I personally reviewed and went over laboratory results with the patient.  The results are noted within this dictation.  No role for laboratory work today.  She tolerated her first cycle of chemotherapy well. She denies any nausea or vomiting. Her  appetite is at baseline.  She does have a few complaints however: 1. She notes with him this morning with increased lower back pain. She notes it increases with movement. She reports that it is not midline but actually to the side, favoring the right side. She utilized pain medication at home which she is resistant to take. I think this is a muscle strain.  Have offered her a plain film of her back which she is interested in doing. Orders placed. She will have this done today. 2. She recently underwent podiatry intervention for an ingrown toenail left foot. She is presently on Augmentin for this. 3. On the volar aspect of wrist she has a fluid-filled nodule favoring the radial aspect of wrist that is bilobed, nontender, nonerythematous.   Past Medical History  Diagnosis Date  . Chronic diastolic CHF (congestive heart failure) (HCC)     a. nl EF by echo 05/2009.  Marland Kitchen DVT of deep femoral vein ([place])   . History of PSVT (paroxysmal supraventricular tachycardia)   . Hypertension   . Abdominal adhesions   . Ileostomy present ([place])   . Colon polyp   . Chest pain     a. 2002 Cath: nl cors;  b. 2009 aden mv: nl;  c. 05/2009 Echo: nl. d. 2014: normal nuclear stress test, EF 69%.  . Wears dentures   . Breast lump   . Hyperlipidemia   . Thyroid disease   . Anemia   . History of blood clots   . Hernia   . Blood transfusion   .  Hearing loss   . Arthritis   . GERD (gastroesophageal reflux disease)   . Cancer ([place])     bladder  . Colon cancer ([place])     a. recurrence 2016 with liver mets -  percutaneous liver biopsy and thermal ablation of a liver metastasis by interventional radiology..  . Iron deficiency anemia due to chronic blood loss 07/06/2013  . UTI (urinary tract infection)   . Obstruction of intestine or colon ([place]) 06/2014  . Diabetes mellitus without complication ([place]) 4/85/46    borderline type II; takes no medication for it  . Vision abnormalities 2016    not going blind but having  retina problems; might lose ability to see faces  . LBBB (left bundle branch block)   . Asthma     hx of  . Depression   . CKD (chronic kidney disease), stage III     pt. states decreased kidney function  . Headache   . Fibromyalgia   . Achalasia     a. s/p botox injection for achalasia.  . Sinus bradycardia     a. HR 30s in 08/2014 requiring holding of digoxin.  . Family history of colon cancer   . Family history of bladder cancer   . Family history of breast cancer     has Left bundle branch block; Chest pain, exertional; Benign hypertensive heart disease without heart failure; Hypothyroid; Small bowel obstruction, partial ([place]); Dysphagia; Paroxysmal SVT (supraventricular tachycardia) ([place]); Ileostomy status ([place]); Unstable angina ([place]); Parastomal hernia; Iron deficiency anemia due to chronic blood loss; Malabsorption of iron; Acute abdominal pain; Acute renal failure ([place]); Hyperglycemia; UTI (urinary tract infection); Small bowel obstruction ([place]); Abdominal pain, acute; Liver mass, left lobe; Colon cancer metastasized to liver [facility]); Liver lesion; Sinus bradycardia; Family history of colon cancer; Family history of bladder cancer; Family history of breast cancer; Genetic testing; Counseling regarding end of life decision making; and Neoplasm related pain on her problem list.     is allergic to bactrim; codeine; demerol; lidocaine hcl; morphine and related; dilaudid; nitrofurantoin monohyd macro; lisinopril; and ramipril.  Current Outpatient Prescriptions on File Prior to Visit  Medication Sig Dispense Refill  . ALPRAZolam (XANAX) 0.5 MG tablet Take 0.5 mg by mouth at bedtime.     Marland Kitchen amitriptyline (ELAVIL) 10 MG tablet Take 20 mg by mouth at bedtime.     Marland Kitchen aspirin EC 325 MG tablet Take 325 mg by mouth daily.    . Calcium Carbonate (CALTRATE 600 PO) Take by mouth daily.    . carvedilol (COREG) 12.5 MG tablet Take 1 tablet (12.5 mg total) by mouth 2 (two) times daily with a meal. 180  tablet 3  . fentaNYL (DURAGESIC - DOSED MCG/HR) 12 MCG/HR Place 1 patch onto the skin every 3 (three) days.    . fentaNYL (DURAGESIC - DOSED MCG/HR) 25 MCG/HR patch Place 1 patch (25 mcg total) onto the skin every 3 (three) days. 10 patch 0  . HYDROcodone-acetaminophen (NORCO) 7.5-325 MG tablet Take 1 tablet by mouth every 4 (four) hours as needed.     . hydrocortisone (CORTENEMA) 100 MG/60ML enema Place 1 enema (100 mg total) rectally at bedtime. 14 enema 0  . hyoscyamine (LEVSIN SL) 0.125 MG SL tablet Place 1 tablet (0.125 mg total) under the tongue every 6 (six) hours as needed (lower abdominal pain). 90 tablet 2  . ketoconazole (NIZORAL) 2 % cream Apply 1 application topically daily. To fungal area on toe    . levothyroxine (SYNTHROID, LEVOTHROID)  75 MCG tablet Take 75 mcg by mouth daily.     Marland Kitchen losartan-hydrochlorothiazide (HYZAAR) 100-12.5 MG per tablet Take by mouth as directed. 1/2 TABLET DAILY. DO NOT TAKE IF YOUR TOP BLOOD PRESSURE NUMBER IS LESS THAN 110    . Multiple Vitamins-Minerals (EQL GUMMY ADULT) CHEW Chew 2 tablets by mouth daily. Reported on 02/06/2015    . NITROSTAT 0.4 MG SL tablet DISSOLVE ONE TABLET UNDER THE TONGUE EVERY 5 MINUTES AS NEEDED FOR CHEST PAIN.  DO NOT EXCEED A TOTAL OF 3 DOSES IN 15 MINUTES 25 tablet 0  . nystatin (MYCOSTATIN/NYSTOP) 100000 UNIT/GM POWD Apply 1 Bottle topically 2 (two) times daily. 60 g 0  . nystatin-triamcinolone (MYCOLOG II) cream Apply 1 application topically 2 (two) times daily. 30 g 1  . Omega-3 Fatty Acids (FISH OIL) 1000 MG CAPS Take 1 capsule by mouth daily. Reported on 02/19/2015    . ondansetron (ZOFRAN-ODT) 4 MG disintegrating tablet Take 1 tablet (4 mg total) by mouth every 8 (eight) hours as needed for nausea or vomiting. 45 tablet 1  . Panitumumab (VECTIBIX IV) Inject into the vein. To start 04/20/15. To be given every 2 weeks.    . pantoprazole (PROTONIX) 40 MG tablet Take 40 mg by mouth daily.     Marland Kitchen triamcinolone cream (KENALOG) 0.1  %      No current facility-administered medications on file prior to visit.    Past Surgical History  Procedure Laterality Date  . Cardiac catheterization  12/10/2000    THE LEFT VENTRICLE IS MILDY DILATED. THERE IS MILD TO MODERATE DIFFUSE HYPOKINESIS WITH EF 35%  . Colon cancer surgery    . Ileostomy    . Esophagogastroduodenoscopy  02/13/2011    Procedure: ESOPHAGOGASTRODUODENOSCOPY (EGD);  Surgeon: Rogene Houston, MD;  Location: [facility];  Service: Endoscopy;  Laterality: N/A;  1030  . Colonoscopy  09/26/2011    Procedure: COLONOSCOPY;  Surgeon: Rogene Houston, MD;  Location: [facility];  Service: Endoscopy;  Laterality: N/A;  215  . Cataract extraction w/phaco  11/13/2011    Procedure: CATARACT EXTRACTION PHACO AND INTRAOCULAR LENS PLACEMENT (IOC);  Surgeon: Tonny Branch, MD;  Location: [facility] OR[facility];  Service: Ophthalmology;  Laterality: Right;  CDE 12.26  . Cataract extraction w/phaco  12/08/2011    Procedure: CATARACT EXTRACTION PHACO AND INTRAOCULAR LENS PLACEMENT (IOC);  Surgeon: Tonny Branch, MD;  Location: [facility];  Service: Ophthalmology;  Laterality: Left;  CDE 15.11  . Esophagogastroduodenoscopy N/A 09/01/2013    Procedure: ESOPHAGOGASTRODUODENOSCOPY (EGD);  Surgeon: Rogene Houston, MD;  Location: [facility];  Service: Endoscopy;  Laterality: N/A;  830  . Botox injection N/A 09/01/2013    Procedure: BOTOX INJECTION;  Surgeon: Rogene Houston, MD;  Location: [facility];  Service: Endoscopy;  Laterality: N/A;  . Abdominal hysterectomy    . Cholecystectomy    . Joint replacement Left 2008  . Tonsillectomy    . Colon surgery    . Eye surgery  2014    catarac surgery  . Appendectomy    . Ovarian cystectomy 1955    . Ileostomy    . Coronary angioplasty    . Rotator cuff repair    . Breast surgery      right breast biopsy  . Liver biopsy and ablation  09/01/14    Denies any headaches, dizziness, double vision, fevers, chills, night sweats, nausea, vomiting, diarrhea,  constipation, chest pain, heart palpitations, shortness of breath, blood in stool, black tarry stool,  urinary pain, urinary burning, urinary frequency, hematuria.   PHYSICAL EXAMINATION  ECOG PERFORMANCE STATUS: 1 - Symptomatic but completely ambulatory  Filed Vitals:   04/27/15 0921  BP: 98/54  Pulse: 76  Temp: 98.9 F (37.2 C)    GENERAL:alert, no distress, well nourished, well developed, comfortable, cooperative, smiling and accompanied by husband and daughter. SKIN: skin color, texture, turgor are normal, no rashes or significant lesions HEAD: Normocephalic, No masses, lesions, tenderness or abnormalities EYES: normal, EOMI, Conjunctiva are pink and non-injected EARS: External ears normal OROPHARYNX:lips, buccal mucosa, and tongue normal and mucous membranes are moist  NECK: supple, trachea midline LYMPH:  no palpable lymphadenopathy BREAST:not examined LUNGS: clear to auscultation and percussion HEART: regular rate & rhythm ABDOMEN:abdomen soft and normal bowel sounds BACK: Back symmetric, no curvature. right latissimus dorsi muscle tightness EXTREMITIES:less then 2 second capillary refill, no joint deformities, effusion, or inflammation, no skin discoloration, left volar aspect of wrist fluid-filled cyst that is bilobed and communicates with each other evident by palpation   NEURO: alert & oriented x 3 with fluent speech, in wheelchair   LABORATORY DATA: CBC    Component Value Date/Time   WBC 4.4 04/20/2015 1014   RBC 3.54* 04/20/2015 1014   RBC 3.45* 11/24/2008 1500   HGB 11.7* 04/20/2015 1014   HCT 33.9* 04/20/2015 1014   PLT 159 04/20/2015 1014   MCV 95.8 04/20/2015 1014   MCH 33.1 04/20/2015 1014   MCHC 34.5 04/20/2015 1014   RDW 17.9* 04/20/2015 1014   LYMPHSABS 1.1 04/20/2015 1014   MONOABS 0.7 04/20/2015 1014   EOSABS 0.2 04/20/2015 1014   BASOSABS 0.0 04/20/2015 1014      Chemistry      Component Value Date/Time   NA 141 04/20/2015 1014   K 3.5  04/20/2015 1014   CL 105 04/20/2015 1014   CO2 25 04/20/2015 1014   BUN 27* 04/20/2015 1014   CREATININE 1.31* 04/20/2015 1014   CREATININE 0.95* 12/07/2014 1512      Component Value Date/Time   CALCIUM 9.3 04/20/2015 1014   ALKPHOS 67 04/20/2015 1014   AST 22 04/20/2015 1014   ALT 13* 04/20/2015 1014   BILITOT 0.6 04/20/2015 1014        PENDING LABS:   RADIOGRAPHIC STUDIES:  Ct Abdomen Pelvis W Wo Contrast  04/12/2015  CLINICAL DATA:  82 year old female with history of colon cancer with metastatic disease to the liver. Currently undergoing chemotherapy. Followup study. EXAM: CT CHEST, ABDOMEN, AND PELVIS WITH CONTRAST TECHNIQUE: Multidetector CT imaging of the chest, abdomen and pelvis was performed following the standard protocol during bolus administration of intravenous contrast. CONTRAST:  1m OMNIPAQUE IOHEXOL 300 MG/ML  SOLN COMPARISON:  Multiple priors, most recently PET-CT 01/18/2015. FINDINGS: CT CHEST FINDINGS Mediastinum/Lymph Nodes: Heart size is mildly enlarged. There is no significant pericardial fluid, thickening or pericardial calcification. There is atherosclerosis of the thoracic aorta, the great vessels of the mediastinum and the coronary arteries, including calcified atherosclerotic plaque in the left anterior descending and left circumflex coronary arteries. Calcifications of the mitral annulus. No pathologically enlarged mediastinal or hilar lymph nodes. Mild circumferential thickening of the distal half of the esophagus. No discrete esophageal mass identified. No axillary lymphadenopathy. Lungs/Pleura: A few scattered tiny sub cm subpleural nodules are noted, largest of which is a 3 mm nodule in the posterior aspect of the left lower lobe. These are unchanged, presumably a benign subpleural lymph nodes. No other larger more suspicious appearing pulmonary nodules or masses are noted  to suggest metastatic disease to the lungs. Mild scarring in the right lower lobe. No  acute consolidative airspace disease. No pleural effusions. Musculoskeletal/Soft Tissues: There are no aggressive appearing lytic or blastic lesions noted in the visualized portions of the skeleton. CT ABDOMEN AND PELVIS FINDINGS Hepatobiliary: Interval increase in size of a hypovascular heterogeneously enhancing lesion in segments 2 and 3 of the liver, which currently measures 3.1 x 2.0 x 2.3 cm (axial image 48 of series 7 and coronal image 21 of series 9). There is also a a 7 mm well-defined low-attenuation lesion in the central aspect of segment 7 of the liver, too small to definitively characterize, but similar to the prior study, likely tiny cysts. No new hepatic lesions are noted. No intra or extrahepatic biliary ductal dilatation. Status post cholecystectomy. Pancreas: Fatty infiltration in the head of the pancreas, similar prior studies. No pancreatic mass. No pancreatic ductal dilatation. No pancreatic or peripancreatic fluid or inflammatory changes. Spleen: Unremarkable. Adrenals/Urinary Tract: In the anterior aspect of the lower pole of the left kidney there is a well-circumscribed 1.1 x 1.4 cm lesion (image 33 of series 2) which measures 62 HU on precontrast images, 63 HU on arterial phase images, 79 HU on portal venous phase images, and 68 HU on delayed images. This lesion is only slightly larger than prior study 07/13/2014, at which point this measured 12 x 9 mm. 1.8 cm low-attenuation lesion in the interpolar region of the right kidney is compatible with a simple cyst. Sub cm low-attenuation lesion in the interpolar region of the left kidney is too small to definitively characterize, but is likely a cyst. No hydroureteronephrosis or perinephric stranding. Urinary bladder is obscured by beam hardening artifact from the patient's left hip arthroplasty. Bilateral adrenal glands are normal in appearance. Stomach/Bowel: Normal appearance of the stomach. No pathologic dilatation of small bowel or remaining  colon. Status post right hemicolectomy with right lower quadrant ileostomy. Laxity of the lower right anterior abdominal wall. Small parastomal hernia containing several loops of small bowel. Notably, although the remaining portions of transverse, descending, sigmoid colon and rectum are decompressed, in the left lower quadrant (image 85 of series 7) there is a new soft tissue mass which appears to be intimately associated with the colon at the junction of descending colon and sigmoid colon, which measures approximately 2.9 x 4.1 x 4.5 cm, concerning for new colonic neoplasm. The possibility of a peritoneal implant in this region is not entirely excluded. Vascular/Lymphatic: Atherosclerosis throughout the abdominal and pelvic vasculature, without evidence of aneurysm or dissection. No lymphadenopathy noted in the abdomen or pelvis. Reproductive: Status post hysterectomy. Ovaries are not confidently identified may be surgically absent or atrophic (much of the pelvis is obscured by beam hardening artifact from the patient's left hip prosthesis). Other: No significant volume of ascites.  No pneumoperitoneum. Musculoskeletal: Status post left total hip arthroplasty. There are no aggressive appearing lytic or blastic lesions noted in the visualized portions of the skeleton. IMPRESSION: 1. Progression of disease as evidenced by enlargement of the metastatic lesion in segments 2/3 of the liver. 2. In addition, there has been interval development of a 2.9 x 4.1 x 4.5 cm mass which appears intimately associated with the remaining portion of the colon at the junction of the descending colon and sigmoid colon. This appears to arise from this portion of the colon and is favored to represent a new colonic neoplasm. The possibility of a solitary peritoneal implant in this region is not entirely  excluded. This could be further evaluated with colonoscopy if clinically appropriate. 3. No signs of metastatic disease to the thorax. 4.  1.1 x 1.4 cm indeterminate lesion in the lower pole of the left kidney. This is favored to represent a proteinaceous cyst with apparent pseudoenhancement on today's examination secondary to the small size of the lesion. However, this lesion was mildly complex on prior MRI of the abdomen 12/08/2014. Strictly speaking, the possibility of a small slow-growing cystic neoplasm is not entirely excluded. Close attention on followup studies is recommended. 5. Circumferential thickening of the distal half of the esophagus. This may simply reflect esophagitis, however, if there is any clinical concern for Barrett's metaplasia or esophageal neoplasia, further evaluation with endoscopy should be considered. 6. Mild cardiomegaly. 7.  Atherosclerosis, including 2 vessel coronary artery disease. 8. Additional incidental findings, as above. Electronically Signed   By: Vinnie Langton M.D.   On: 04/12/2015 15:18   Dg Thoracic Spine 2 View  04/27/2015  CLINICAL DATA:  Chronic back pain worsening recently. History of metastatic colon cancer. EXAM: THORACIC SPINE 2 VIEWS COMPARISON:  CT scan 04/12/2015 FINDINGS: Left convex thoracic scoliosis. Moderate thoracic kyphosis. Slight anterior wedging of the mid thoracic vertebral bodies along with degenerative disc disease and anterior spurring. These findings appear stable since the prior CT examination. No acute compression fracture is identified. No worrisome bone lesions. No abnormal paraspinal soft tissue swelling. The visualized lungs are grossly clear. IMPRESSION: Scoliosis and thoracic kyphosis with mid thoracic degenerative changes. No acute bony findings or worrisome bone lesions. Electronically Signed   By: Marijo Sanes M.D.   On: 04/27/2015 12:33   Dg Lumbar Spine Complete  04/27/2015  CLINICAL DATA:  Chronic low back pain, worsening. No known injury. Initial encounter. EXAM: LUMBAR SPINE - COMPLETE 4+ VIEW COMPARISON:  CT chest, abdomen and pelvis 04/12/2015. FINDINGS:  Vertebral body height is maintained. 0.4 cm anterolisthesis L4 on L5 due to facet arthropathy is unchanged. Mild loss of disc space height is seen at L4-5 and L5-S1. Atherosclerotic vascular disease is noted. Degenerative change is present about the right hip. The patient is status post left hip replacement. IMPRESSION: No change in mild appearing spondylosis of the lower lumbar spine. No acute abnormality. Electronically Signed   By: Inge Rise M.D.   On: 04/27/2015 12:34   Ct Chest W Contrast  04/12/2015  CLINICAL DATA:  82 year old female with history of colon cancer with metastatic disease to the liver. Currently undergoing chemotherapy. Followup study. EXAM: CT CHEST, ABDOMEN, AND PELVIS WITH CONTRAST TECHNIQUE: Multidetector CT imaging of the chest, abdomen and pelvis was performed following the standard protocol during bolus administration of intravenous contrast. CONTRAST:  27m OMNIPAQUE IOHEXOL 300 MG/ML  SOLN COMPARISON:  Multiple priors, most recently PET-CT 01/18/2015. FINDINGS: CT CHEST FINDINGS Mediastinum/Lymph Nodes: Heart size is mildly enlarged. There is no significant pericardial fluid, thickening or pericardial calcification. There is atherosclerosis of the thoracic aorta, the great vessels of the mediastinum and the coronary arteries, including calcified atherosclerotic plaque in the left anterior descending and left circumflex coronary arteries. Calcifications of the mitral annulus. No pathologically enlarged mediastinal or hilar lymph nodes. Mild circumferential thickening of the distal half of the esophagus. No discrete esophageal mass identified. No axillary lymphadenopathy. Lungs/Pleura: A few scattered tiny sub cm subpleural nodules are noted, largest of which is a 3 mm nodule in the posterior aspect of the left lower lobe. These are unchanged, presumably a benign subpleural lymph nodes. No other larger more suspicious  appearing pulmonary nodules or masses are noted to suggest  metastatic disease to the lungs. Mild scarring in the right lower lobe. No acute consolidative airspace disease. No pleural effusions. Musculoskeletal/Soft Tissues: There are no aggressive appearing lytic or blastic lesions noted in the visualized portions of the skeleton. CT ABDOMEN AND PELVIS FINDINGS Hepatobiliary: Interval increase in size of a hypovascular heterogeneously enhancing lesion in segments 2 and 3 of the liver, which currently measures 3.1 x 2.0 x 2.3 cm (axial image 48 of series 7 and coronal image 21 of series 9). There is also a a 7 mm well-defined low-attenuation lesion in the central aspect of segment 7 of the liver, too small to definitively characterize, but similar to the prior study, likely tiny cysts. No new hepatic lesions are noted. No intra or extrahepatic biliary ductal dilatation. Status post cholecystectomy. Pancreas: Fatty infiltration in the head of the pancreas, similar prior studies. No pancreatic mass. No pancreatic ductal dilatation. No pancreatic or peripancreatic fluid or inflammatory changes. Spleen: Unremarkable. Adrenals/Urinary Tract: In the anterior aspect of the lower pole of the left kidney there is a well-circumscribed 1.1 x 1.4 cm lesion (image 33 of series 2) which measures 62 HU on precontrast images, 63 HU on arterial phase images, 79 HU on portal venous phase images, and 68 HU on delayed images. This lesion is only slightly larger than prior study 07/13/2014, at which point this measured 12 x 9 mm. 1.8 cm low-attenuation lesion in the interpolar region of the right kidney is compatible with a simple cyst. Sub cm low-attenuation lesion in the interpolar region of the left kidney is too small to definitively characterize, but is likely a cyst. No hydroureteronephrosis or perinephric stranding. Urinary bladder is obscured by beam hardening artifact from the patient's left hip arthroplasty. Bilateral adrenal glands are normal in appearance. Stomach/Bowel: Normal  appearance of the stomach. No pathologic dilatation of small bowel or remaining colon. Status post right hemicolectomy with right lower quadrant ileostomy. Laxity of the lower right anterior abdominal wall. Small parastomal hernia containing several loops of small bowel. Notably, although the remaining portions of transverse, descending, sigmoid colon and rectum are decompressed, in the left lower quadrant (image 85 of series 7) there is a new soft tissue mass which appears to be intimately associated with the colon at the junction of descending colon and sigmoid colon, which measures approximately 2.9 x 4.1 x 4.5 cm, concerning for new colonic neoplasm. The possibility of a peritoneal implant in this region is not entirely excluded. Vascular/Lymphatic: Atherosclerosis throughout the abdominal and pelvic vasculature, without evidence of aneurysm or dissection. No lymphadenopathy noted in the abdomen or pelvis. Reproductive: Status post hysterectomy. Ovaries are not confidently identified may be surgically absent or atrophic (much of the pelvis is obscured by beam hardening artifact from the patient's left hip prosthesis). Other: No significant volume of ascites.  No pneumoperitoneum. Musculoskeletal: Status post left total hip arthroplasty. There are no aggressive appearing lytic or blastic lesions noted in the visualized portions of the skeleton. IMPRESSION: 1. Progression of disease as evidenced by enlargement of the metastatic lesion in segments 2/3 of the liver. 2. In addition, there has been interval development of a 2.9 x 4.1 x 4.5 cm mass which appears intimately associated with the remaining portion of the colon at the junction of the descending colon and sigmoid colon. This appears to arise from this portion of the colon and is favored to represent a new colonic neoplasm. The possibility of a solitary peritoneal  implant in this region is not entirely excluded. This could be further evaluated with  colonoscopy if clinically appropriate. 3. No signs of metastatic disease to the thorax. 4. 1.1 x 1.4 cm indeterminate lesion in the lower pole of the left kidney. This is favored to represent a proteinaceous cyst with apparent pseudoenhancement on today's examination secondary to the small size of the lesion. However, this lesion was mildly complex on prior MRI of the abdomen 12/08/2014. Strictly speaking, the possibility of a small slow-growing cystic neoplasm is not entirely excluded. Close attention on followup studies is recommended. 5. Circumferential thickening of the distal half of the esophagus. This may simply reflect esophagitis, however, if there is any clinical concern for Barrett's metaplasia or esophageal neoplasia, further evaluation with endoscopy should be considered. 6. Mild cardiomegaly. 7.  Atherosclerosis, including 2 vessel coronary artery disease. 8. Additional incidental findings, as above. Electronically Signed   By: Vinnie Langton M.D.   On: 04/12/2015 15:18     PATHOLOGY:    ASSESSMENT AND PLAN:  Colon cancer metastasized to liver ([place]) Stage IV CRC, KRAS wild-type, S/P microwave ablation of a solitary liver lesion on 09/01/2014.  S/P palliative Xeloda 1500 mg BID 7 days on and 7 days off beginning 01/2015 with progression of fisease identified in March 2017 resulting in a discontinuation in [place] therapy.  Now on Vectibix every 2 weeks beginning on 04/20/2015.  Oncology history updated.  TODAY IS A NADIR CHECK after being treated for cycle #1 last week.  Se will return next week for cycle #2 of single-agent Vectibix.  She is having difficulty with her mortality and is S/P outpatient consultation with palliative medicine on 04/20/2015.  She notes new back pain this is likely a muscle spasm of latissiumus based upon description of patient.  She is interested in having plain film done today as this is a new symptom as of early this AM.  Orders are placed for DG L-spine and  thoracic spine.  We will keep her apprised of results.    She notes worsening pain of her neck in the setting of previously diagnosed "pinched nerve" managed by Dr. Christella Noa (neurosurgery) via physical therapy.    She has a fluid filled cyst on the volar aspect of wrist, radial aspect, that is bilobed, nontender, nonerythematous.  Suspect ganglion cyst despite on the less common aspect of wrist.  Observation only is recommended.  She will return for follow-up in 3 weeks with cycle #3 of treatment.    THERAPY PLAN:  Continue treatment as planned in a salvage setting.  All questions were answered. The patient knows to call the clinic with any problems, questions or concerns. We can certainly see the patient much sooner if necessary.  Patient and plan discussed with Dr. Ancil Linsey and she is in agreement with the aforementioned.   This note is electronically signed by: Doy Mince 04/27/2015 4:03 PM

## 2015-04-27 ENCOUNTER — Ambulatory Visit (HOSPITAL_COMMUNITY)
Admission: RE | Admit: 2015-04-27 | Discharge: 2015-04-27 | Disposition: A | Discharge status: Home / Self Care | Payer: Medicare Other | Source: Ambulatory Visit | Attending: Oncology | Admitting: Oncology

## 2015-04-27 ENCOUNTER — Encounter (HOSPITAL_BASED_OUTPATIENT_CLINIC_OR_DEPARTMENT_OTHER): Payer: Medicare Other | Admitting: Oncology

## 2015-04-27 VITALS — BP 98/54 | HR 76 | Temp 98.9°F | Wt 149.3 lb

## 2015-04-27 DIAGNOSIS — M419 Scoliosis, unspecified: Secondary | ICD-10-CM | POA: Diagnosis not present

## 2015-04-27 DIAGNOSIS — M47816 Spondylosis without myelopathy or radiculopathy, lumbar region: Secondary | ICD-10-CM | POA: Diagnosis not present

## 2015-04-27 DIAGNOSIS — M47814 Spondylosis without myelopathy or radiculopathy, thoracic region: Secondary | ICD-10-CM | POA: Insufficient documentation

## 2015-04-27 DIAGNOSIS — C189 Malignant neoplasm of colon, unspecified: Secondary | ICD-10-CM | POA: Diagnosis not present

## 2015-04-27 DIAGNOSIS — M71339 Other bursal cyst, unspecified wrist: Secondary | ICD-10-CM | POA: Diagnosis not present

## 2015-04-27 DIAGNOSIS — M546 Pain in thoracic spine: Secondary | ICD-10-CM | POA: Insufficient documentation

## 2015-04-27 DIAGNOSIS — M545 Low back pain: Secondary | ICD-10-CM | POA: Diagnosis not present

## 2015-04-27 DIAGNOSIS — M542 Cervicalgia: Secondary | ICD-10-CM

## 2015-04-27 DIAGNOSIS — C787 Secondary malignant neoplasm of liver and intrahepatic bile duct: Secondary | ICD-10-CM | POA: Diagnosis not present

## 2015-04-27 NOTE — Patient Instructions (Signed)
Treatment next week as planned.  Pre-treatment labs next week with treatment too.  If back pain persists, please call the clinic and we can order an xray of your back.    Return next week for cycle #2 of treatment.  Return in 3 weeks for follow-up appointment, labs, and cycle #3.

## 2015-05-01 ENCOUNTER — Telehealth (HOSPITAL_COMMUNITY): Payer: Self-pay | Admitting: *Deleted

## 2015-05-01 NOTE — Telephone Encounter (Signed)
Inside of patient's nose is sore. Around the edges there is a "crust". There are "crusty feeling areas" around her mouth area and chin. Rt thumb near nail (not the cuticle and not the nail) is split. 2-3 places on right ring finer and middle finger that are red. Right lacrimal duct area is itching "awful". Pt having hot flashes x 2-3 days. Pt loaded her hands up with vaseline last pm and put on her cotton gloves and went to bed. I instructed pt to use Udder cream twice a day to hands/finger nails, feet/toe nails. Pt instructed to put some vaseline in her nose also. Scabs in patient's head per patient. Scalp is itching bad. All of this comes in "spells". I also instructed pt to try to wash hair as often as she has been doing. She washes it every other day. Pt states her hair is straight and it looks awful if she doesn't wash it. Pt scheduled to see Dr. Whitney Muse on 05/11/15. Pt instructed to call if symptoms worsen.

## 2015-05-04 ENCOUNTER — Encounter (HOSPITAL_BASED_OUTPATIENT_CLINIC_OR_DEPARTMENT_OTHER): Payer: Medicare Other

## 2015-05-04 VITALS — BP 117/48 | HR 65 | Temp 98.2°F | Resp 16 | Wt 148.4 lb

## 2015-05-04 DIAGNOSIS — D509 Iron deficiency anemia, unspecified: Secondary | ICD-10-CM | POA: Diagnosis not present

## 2015-05-04 DIAGNOSIS — C189 Malignant neoplasm of colon, unspecified: Secondary | ICD-10-CM

## 2015-05-04 DIAGNOSIS — Z5112 Encounter for antineoplastic immunotherapy: Secondary | ICD-10-CM

## 2015-05-04 DIAGNOSIS — C787 Secondary malignant neoplasm of liver and intrahepatic bile duct: Secondary | ICD-10-CM | POA: Diagnosis not present

## 2015-05-04 LAB — CBC WITH DIFFERENTIAL/PLATELET
BASOS PCT: 1 %
Basophils Absolute: 0 10*3/uL (ref 0.0–0.1)
EOS ABS: 0.1 10*3/uL (ref 0.0–0.7)
EOS PCT: 3 %
HCT: 34.8 % — ABNORMAL LOW (ref 36.0–46.0)
Hemoglobin: 11.7 g/dL — ABNORMAL LOW (ref 12.0–15.0)
Lymphocytes Relative: 19 %
Lymphs Abs: 0.9 10*3/uL (ref 0.7–4.0)
MCH: 32.5 pg (ref 26.0–34.0)
MCHC: 33.6 g/dL (ref 30.0–36.0)
MCV: 96.7 fL (ref 78.0–100.0)
MONO ABS: 0.6 10*3/uL (ref 0.1–1.0)
MONOS PCT: 13 %
NEUTROS PCT: 64 %
Neutro Abs: 3.2 10*3/uL (ref 1.7–7.7)
PLATELETS: 186 10*3/uL (ref 150–400)
RBC: 3.6 MIL/uL — ABNORMAL LOW (ref 3.87–5.11)
RDW: 15.8 % — AB (ref 11.5–15.5)
WBC: 4.9 10*3/uL (ref 4.0–10.5)

## 2015-05-04 LAB — COMPREHENSIVE METABOLIC PANEL
ALBUMIN: 3.8 g/dL (ref 3.5–5.0)
ALK PHOS: 68 U/L (ref 38–126)
ALT: 14 U/L (ref 14–54)
AST: 24 U/L (ref 15–41)
Anion gap: 9 (ref 5–15)
BILIRUBIN TOTAL: 0.6 mg/dL (ref 0.3–1.2)
BUN: 21 mg/dL — ABNORMAL HIGH (ref 6–20)
CALCIUM: 9.4 mg/dL (ref 8.9–10.3)
CO2: 25 mmol/L (ref 22–32)
Chloride: 107 mmol/L (ref 101–111)
Creatinine, Ser: 1.05 mg/dL — ABNORMAL HIGH (ref 0.44–1.00)
GFR, EST AFRICAN AMERICAN: 55 mL/min — AB (ref >60)
GFR, EST NON AFRICAN AMERICAN: 48 mL/min — AB (ref >60)
GLUCOSE: 101 mg/dL — AB (ref 65–99)
Potassium: 3.7 mmol/L (ref 3.5–5.1)
SODIUM: 141 mmol/L (ref 135–145)
TOTAL PROTEIN: 7.4 g/dL (ref 6.5–8.1)

## 2015-05-04 LAB — MAGNESIUM: Magnesium: 1.7 mg/dL (ref 1.7–2.4)

## 2015-05-04 MED ORDER — SODIUM CHLORIDE 0.9% FLUSH: 10.0000 mL | INTRAVENOUS | Status: DC | PRN

## 2015-05-04 MED ORDER — MAGNESIUM OXIDE 400 (241.3 MG) MG PO TABS: 800.0000 mg | ORAL_TABLET | Freq: Every day | ORAL | Status: DC

## 2015-05-04 MED ORDER — SODIUM CHLORIDE 0.9 % IV SOLN
Freq: Once | INTRAVENOUS | Status: AC
  Administered 2015-05-04: 12:00:00 via INTRAVENOUS

## 2015-05-04 MED ORDER — PANITUMUMAB CHEMO INJECTION 100 MG/5ML
6.0000 mg/kg | Freq: Once | INTRAVENOUS | Status: AC
  Administered 2015-05-04: 400 mg via INTRAVENOUS
  Filled 2015-05-04: qty 20

## 2015-05-04 NOTE — Patient Instructions (Signed)
[facility] Discharge Instructions for Patients Receiving Chemotherapy   Beginning January 23rd 2017 lab work for the [facility] will be done in the  Main lab at [facility] on 1st floor. If you have a lab appointment with the [facility] please come in thru the  Main Entrance and check in at the main information desk   Today you received the following chemotherapy agents vectibix Magnesium oxide 400 mg take 2 tablets daily- called to drugstore  To help prevent nausea and vomiting after your treatment, we encourage you to take your nausea medication    If you develop nausea and vomiting, or diarrhea that is not controlled by your medication, call the clinic.  The clinic phone number is [phone]. Office hours are Monday-Friday 8:30am-5:00pm.  BELOW ARE SYMPTOMS THAT SHOULD BE REPORTED IMMEDIATELY:  *FEVER GREATER THAN 101.0 F  *CHILLS WITH OR WITHOUT FEVER  NAUSEA AND VOMITING THAT IS NOT CONTROLLED WITH YOUR NAUSEA MEDICATION  *UNUSUAL SHORTNESS OF BREATH  *UNUSUAL BRUISING OR BLEEDING  TENDERNESS IN MOUTH AND THROAT WITH OR WITHOUT PRESENCE OF ULCERS  *URINARY PROBLEMS  *BOWEL PROBLEMS  UNUSUAL RASH Items with * indicate a potential emergency and should be followed up as soon as possible. If you have an emergency after office hours please contact your primary care physician or go to the nearest emergency department.  Please call the clinic during office hours if you have any questions or concerns.   You may also contact the Patient Navigator at [phone] should you have any questions or need assistance in obtaining follow up care.      Resources For Cancer Patients and their Caregivers ? American Cancer Society: Can assist with transportation, wigs, general needs, runs Look Good Feel Better.        306-879-7769 ? Cancer Care: Provides financial assistance, online support groups, medication/co-pay assistance.  1-800-813-HOPE  (952) 685-7564) ? [facility] Assists [place] cancer patients and their families through emotional , educational and financial support.  7794420240 ? [place] DSS Where to apply for food stamps, Medicaid and utility assistance. 5742520043 ? [employer]: Transportation to medical appointments. 201-001-6727 ? Social Security Administration: May apply for disability if have a Stage IV cancer. (435)149-4685 (343) 431-6699 ? [employer], Disability and Transit Services: Assists with nutrition, care and transit needs. 872-506-1502

## 2015-05-04 NOTE — Progress Notes (Signed)
Tolerated well. Instructed to begin PO magnesium

## 2015-05-10 DIAGNOSIS — M79675 Pain in left toe(s): Secondary | ICD-10-CM | POA: Diagnosis not present

## 2015-05-10 DIAGNOSIS — M79672 Pain in left foot: Secondary | ICD-10-CM | POA: Diagnosis not present

## 2015-05-10 DIAGNOSIS — L600 Ingrowing nail: Secondary | ICD-10-CM | POA: Diagnosis not present

## 2015-05-10 DIAGNOSIS — L03032 Cellulitis of left toe: Secondary | ICD-10-CM | POA: Diagnosis not present

## 2015-05-11 ENCOUNTER — Encounter (HOSPITAL_COMMUNITY): Payer: Self-pay | Admitting: Hematology & Oncology

## 2015-05-11 ENCOUNTER — Encounter (HOSPITAL_BASED_OUTPATIENT_CLINIC_OR_DEPARTMENT_OTHER): Payer: Medicare Other | Admitting: Hematology & Oncology

## 2015-05-11 ENCOUNTER — Ambulatory Visit (HOSPITAL_COMMUNITY): Payer: Medicare Other

## 2015-05-11 VITALS — BP 129/64 | HR 74 | Temp 98.7°F | Resp 18 | Wt 145.0 lb

## 2015-05-11 DIAGNOSIS — R239 Unspecified skin changes: Secondary | ICD-10-CM

## 2015-05-11 DIAGNOSIS — R234 Changes in skin texture: Secondary | ICD-10-CM | POA: Diagnosis not present

## 2015-05-11 DIAGNOSIS — C189 Malignant neoplasm of colon, unspecified: Secondary | ICD-10-CM | POA: Diagnosis not present

## 2015-05-11 DIAGNOSIS — C787 Secondary malignant neoplasm of liver and intrahepatic bile duct: Secondary | ICD-10-CM | POA: Diagnosis not present

## 2015-05-11 DIAGNOSIS — D500 Iron deficiency anemia secondary to blood loss (chronic): Secondary | ICD-10-CM

## 2015-05-11 DIAGNOSIS — R52 Pain, unspecified: Secondary | ICD-10-CM | POA: Diagnosis not present

## 2015-05-11 DIAGNOSIS — N289 Disorder of kidney and ureter, unspecified: Secondary | ICD-10-CM | POA: Diagnosis not present

## 2015-05-11 DIAGNOSIS — G893 Neoplasm related pain (acute) (chronic): Secondary | ICD-10-CM

## 2015-05-11 DIAGNOSIS — T451X5A Adverse effect of antineoplastic and immunosuppressive drugs, initial encounter: Secondary | ICD-10-CM

## 2015-05-11 NOTE — Patient Instructions (Addendum)
[place] at [facility] Discharge Instructions  RECOMMENDATIONS MADE BY THE CONSULTANT AND ANY TEST RESULTS WILL BE SENT TO YOUR REFERRING PHYSICIAN.   Exam and discussion by Dr Whitney Muse today Soak fingers in epsom salt then apply antibiotic ointment, then you can use these liquid band aid, use lots of lotions on your hands (vaseoline)  Chemotherapy next week  Return to see the doctor in 4 weeks  Please call the clinic if you have any questions or concerns     Thank you for choosing [place] at [facility] to provide your oncology and hematology care.  To afford each patient quality time with our provider, please arrive at least 15 minutes before your scheduled appointment time.   Beginning January 23rd 2017 lab work for the [name] will be done in the  Main lab at [facility] on 1st floor. If you have a lab appointment with the [place] please come in thru the  Main Entrance and check in at the main information desk  You need to re-schedule your appointment should you arrive 10 or more minutes late.  We strive to give you quality time with our providers, and arriving late affects you and other patients whose appointments are after yours.  Also, if you no show three or more times for appointments you may be dismissed from the clinic at the providers discretion.     Again, thank you for choosing [facility].  Our hope is that these requests will decrease the amount of time that you wait before being seen by our physicians.       _____________________________________________________________  Should you have questions after your visit to [facility], please contact our office at [phone] between the hours of 8:30 a.m. and 4:30 p.m.  Voicemails left after 4:30 p.m. will not be returned until the following business day.  For prescription refill requests, have your pharmacy contact our office.          Resources For Cancer Patients and their Caregivers ? American Cancer Society: Can assist with transportation, wigs, general needs, runs Look Good Feel Better.        646 269 7168 ? Cancer Care: Provides financial assistance, online support groups, medication/co-pay assistance.  1-800-813-HOPE 864-435-0380) ? [place] Assists [place] cancer patients and their families through emotional , educational and financial support.  775-354-2965 ? [place] DSS Where to apply for food stamps, Medicaid and utility assistance. 903 370 4414 ? [employer]: Transportation to medical appointments. 9311574169 ? Social Security Administration: May apply for disability if have a Stage IV cancer. 619-654-2527 514-711-1136 ? [employer], Disability and Transit Services: Assists with nutrition, care and transit needs. (848)237-2241

## 2015-05-11 NOTE — Progress Notes (Signed)
[facility] [place], MD [place] / [place] [address]   DIAGNOSIS: Colon carcinoma with permanent ostomy Diversion Colitis Achalasia Iron deficiency, anemia secondary to GI related blood loss Hemeoccult positive stools Rising CEA with CT imaging on 07/13/2014 1.3 cm mass in L hepatic lobe Subcapsular mass in anterior pole of L kidney suspicious for RCC Cutaneous microwave ablation of her now biopsy-proven metastatic colonic adenocarcinoma to the left liver 09/01/2014   Colon cancer metastasized to liver [facility])   04/12/1987 Definitive Surgery Dr. Lindalou Hose at [facility]   09/01/2014 Pathology Results Liver, needle/core biopsy - METASTATIC ADENOCARCINOMA.   09/01/2014 Miscellaneous MWA left liver lesion, [name] (IR)   01/18/2015 Progression PET scan   01/18/2015 PET scan Recurrence of  tumor within segment 2 of the liver. There are 2 new lesions identified within segment 5 and segment 6 of the liver worrisome for additional sites of metastasis. Evidence of tumor recurrence at the intra colonic anastomosis is noted...   01/28/2015 - 04/16/2015 Chemotherapy Xeloda 1500 mg BID 7 days on and 7 days off beginning in December 2016.   04/12/2015 Imaging CT C/A/P progression of disease, enlargement of metastatic lesion in segments 2/3 of liver, development of mass with colon at junction of descending colon and sigmoid, indeterminate lesion in lower pole of the left kidney   04/20/2015 -  Antibody Plan Vectibix every 2 weeks, single-agent.   CURRENT THERAPY:  XELODA IV iron prn  History of Present Illness: Rital returns today for further follow-up of stage IV CRC s/p microwave ablation of a solitary liver lesion. Unfortunately repeat imaging in December revealed other liver disease and recurrence at the prior anastomotic site.  She also has a L renal lesion that is under observation. She has other health issues including chronic  LBP.  She is currently on single agent Vectibix. She returns to the [facility] today accompanied by her daughter and husband.  She says that her thumb has split and "just throbs." she also notes a place on another finger that is split as well. She confirms lotion, vaseline, sleeping with white gloves on her hands. She knows to use deep moisturizers.  She notes that the corners of her mouth are sore.  She is using a 32mg duragesic patch. She is also still taking her other pain medicine, noting that some days she doesn't need it at all until close to bedtime. She says the other night, she took a pain pill and a xanax and was awake all night. She says she isn't sure why, she just couldn't go to sleep.  Her daughter says that, usually, every time she sits down, if she gets still, she will fall asleep.  She notes that, when she does anything, she hurts. She hurts after doing housework and everything, and "by bedtime, can hardly stand it."  She confirms eating "when I have to." She says she gets hungry, but that she can't think of anything she wants, because nothing tastes good. Her daughter confirms that she is drinking Boost. She ate some oyster stew last night, and liked that. She notes that she feels she can eat soup better than she can eat regular food, nothing "I used to always like potato soup, it's my comfort food."  Karen [name] says she does a lot less, but her husband says she does not. He says he wishes she'd do even less.  She comments that she  sometimes checks her blood pressure and that it was low this morning, so she did not take her medicine.  She notes that a non-healing area on her right ear sometimes keeps her awake at night.this is chronic and she knows she needs to return back to her dermatologist.    MEDICAL HISTORY: Past Medical History  Diagnosis Date  . Chronic diastolic CHF (congestive heart failure) (HCC)     a. nl EF by echo 05/2009.  Marland Kitchen DVT of deep femoral vein  ([place])   . History of PSVT (paroxysmal supraventricular tachycardia)   . Hypertension   . Abdominal adhesions   . Ileostomy present ([place])   . Colon polyp   . Chest pain     a. 2002 Cath: nl cors;  b. 2009 aden mv: nl;  c. 05/2009 Echo: nl. d. 2014: normal nuclear stress test, EF 69%.  . Wears dentures   . Breast lump   . Hyperlipidemia   . Thyroid disease   . Anemia   . History of blood clots   . Hernia   . Blood transfusion   . Hearing loss   . Arthritis   . GERD (gastroesophageal reflux disease)   . Cancer ([place])     bladder  . Colon cancer ([place])     a. recurrence 2016 with liver mets -  percutaneous liver biopsy and thermal ablation of a liver metastasis by interventional radiology..  . Iron deficiency anemia due to chronic blood loss 07/06/2013  . UTI (urinary tract infection)   . Obstruction of intestine or colon ([place]) 06/2014  . Diabetes mellitus without complication ([place]) 6/96/29    borderline type II; takes no medication for it  . Vision abnormalities 2016    not going blind but having retina problems; might lose ability to see faces  . LBBB (left bundle branch block)   . Asthma     hx of  . Depression   . CKD (chronic kidney disease), stage III     pt. states decreased kidney function  . Headache   . Fibromyalgia   . Achalasia     a. s/p botox injection for achalasia.  . Sinus bradycardia     a. HR 30s in 08/2014 requiring holding of digoxin.  . Family history of colon cancer   . Family history of bladder cancer   . Family history of breast cancer     has Left bundle branch block; Chest pain, exertional; Benign hypertensive heart disease without heart failure; Hypothyroid; Small bowel obstruction, partial ([place]); Dysphagia; Paroxysmal SVT (supraventricular tachycardia) ([place]); Ileostomy status ([place]); Unstable angina ([place] [place]); Parastomal hernia; Iron deficiency anemia due to chronic blood loss; Malabsorption of iron; Acute abdominal pain; Acute renal failure ([place]);  Hyperglycemia; UTI (urinary tract infection); Small bowel obstruction ([place]); Abdominal pain, acute; Liver mass, left lobe; Colon cancer metastasized to liver [facility]); Liver lesion; Sinus bradycardia; Family history of colon cancer; Family history of bladder cancer; Family history of breast cancer; Genetic testing; Counseling regarding end of life decision making; and Neoplasm related pain on her problem list.     is allergic to bactrim; codeine; demerol; lidocaine hcl; morphine and related; dilaudid; nitrofurantoin monohyd macro; lisinopril; and ramipril.   Iron Infusion in March 2016. Mammogram performed in 2015. Reported multiple hernias.   SURGICAL HISTORY: Past Surgical History  Procedure Laterality Date  . Cardiac catheterization  12/10/2000    THE LEFT VENTRICLE IS MILDY DILATED. THERE IS MILD TO MODERATE DIFFUSE HYPOKINESIS WITH EF 35%  .  Colon cancer surgery    . Ileostomy    . Esophagogastroduodenoscopy  02/13/2011    Procedure: ESOPHAGOGASTRODUODENOSCOPY (EGD);  Surgeon: Rogene Houston, MD;  Location: [facility];  Service: Endoscopy;  Laterality: N/A;  1030  . Colonoscopy  09/26/2011    Procedure: COLONOSCOPY;  Surgeon: Rogene Houston, MD;  Location: [facility];  Service: Endoscopy;  Laterality: N/A;  215  . Cataract extraction w/phaco  11/13/2011    Procedure: CATARACT EXTRACTION PHACO AND INTRAOCULAR LENS PLACEMENT (IOC);  Surgeon: Tonny Branch, MD;  Location: [facility] ORS;  Service: Ophthalmology;  Laterality: Right;  CDE 12.26  . Cataract extraction w/phaco  12/08/2011    Procedure: CATARACT EXTRACTION PHACO AND INTRAOCULAR LENS PLACEMENT (IOC);  Surgeon: Tonny Branch, MD;  Location: [facility] ORS;  Service: Ophthalmology;  Laterality: Left;  CDE 15.11  . Esophagogastroduodenoscopy N/A 09/01/2013    Procedure: ESOPHAGOGASTRODUODENOSCOPY (EGD);  Surgeon: Rogene Houston, MD;  Location: [facility];  Service: Endoscopy;  Laterality: N/A;  830  . Botox injection N/A 09/01/2013    Procedure:  BOTOX INJECTION;  Surgeon: Rogene Houston, MD;  Location: [facility];  Service: Endoscopy;  Laterality: N/A;  . Abdominal hysterectomy    . Cholecystectomy    . Joint replacement Left 2008  . Tonsillectomy    . Colon surgery    . Eye surgery  2014    catarac surgery  . Appendectomy    . Ovarian cystectomy 1955    . Ileostomy    . Coronary angioplasty    . Rotator cuff repair    . Breast surgery      right breast biopsy  . Liver biopsy and ablation  09/01/14    SOCIAL HISTORY: Social History   Social History  . Marital Status: Married    Spouse Name: N/A  . Number of Children: 4  . Years of Education: N/A   Occupational History  . Not on file.   Social History Main Topics  . Smoking status: Former Smoker -- 0.25 packs/day for 13 years    Types: Cigarettes    Quit date: 02/11/1963  . Smokeless tobacco: Never Used  . Alcohol Use: No  . Drug Use: No  . Sexual Activity: Not on file   Other Topics Concern  . Not on file   Social History Narrative   Reads inspirational books Has 69 Great-Grandchildren and 46 Grandchildren  Father died 16 Mother died 42 Oldest brother died 55 FAMILY HISTORY: Family History  Problem Relation Age of Onset  . Stroke    . Hypertension    . Heart disease Mother   . Stroke Mother     deceased  . Arthritis Mother   . Bladder Cancer Mother     dx in her 9s  . Colon cancer Mother   . Arthritis Father   . Heart disease Father     decesaed  . Leukemia Father   . Stroke Sister     alive/debilitated  . Hypertension Sister   . Other Sister     paralysis  . Heart disease Brother     bypass surgery  . Arthritis Brother   . Colon cancer Brother   . Bladder Cancer Brother   . Stroke Sister     alive/debilitated  . Diabetes Sister   . Other Brother     stomach problems  . Other Brother     bladder  . Colon cancer Paternal Aunt   . Bladder Cancer Other  dx twice in her 30s-40s  . Breast cancer Other     great niece dx  in her early 35s  . Prostate cancer Other     newphew  . Colon polyps Daughter      Review of Systems  Constitutional: Negative for fever, chills, weight loss and malaise/fatigue.  HENT: Positive for hearing loss. Negative for congestion, nosebleeds, sore throat and tinnitus.   Eyes: Negative for blurred vision, double vision, pain and discharge.  Respiratory: Negative for cough, hemoptysis, sputum production, shortness of breath and wheezing.   Cardiovascular: Negative for chest pain, palpitations, claudication, leg swelling and PND.  Gastrointestinal:  Negative for heartburn, abdominal pain, diarrhea, constipation, blood in stool and melena. ALTERED TASTE Genitourinary: Negative for dysuria, urgency, frequency and hematuria.  Musculoskeletal: Positive for joint pain. Negative for myalgias and falls.  Skin: Negative for itching and rash.  Neurological: Positive for weakness. Negative for dizziness, tingling, tremors, sensory change, speech change, focal weakness, seizures, loss of consciousness and headaches.  Endo/Heme/Allergies: Does not bruise/bleed easily.  Psychiatric/Behavioral: Negative for depression, suicidal ideas, memory loss and substance abuse. The patient is not nervous/anxious and does not have insomnia.   14 point review of systems was performed and is negative except as detailed under history of present illness and above   PHYSICAL EXAMINATION  ECOG PERFORMANCE STATUS: 1 - Symptomatic but completely ambulatory  Filed Vitals:   05/11/15 1409  BP: 129/64  Pulse: 74  Temp: 98.7 F (37.1 C)  Resp: 18    Physical Exam  Constitutional: She is oriented to person, place, and time and well-developed, well-nourished, and in no distress. In wheelchair. HENT:  Head: Normocephalic and atraumatic. R ear with small deformity from prior surgery Nose: Nose normal.  Mouth/Throat: Oropharynx is clear and moist. No oropharyngeal exudate.  Eyes: Conjunctivae and EOM are normal.  Pupils are equal, round, and reactive to light. Right eye exhibits no discharge. Left eye exhibits no discharge. No scleral icterus.  Neck: Normal range of motion. Neck supple. No tracheal deviation present. No thyromegaly present.  Cardiovascular: Normal rate, regular rhythm and normal heart sounds.  Exam reveals no gallop and no friction rub.   No murmur heard. Pulmonary/Chest: Effort normal and breath sounds normal. She has no wheezes. She has no rales.  Abdominal: Soft. Bowel sounds are normal. . There is no tenderness. There is no rebound and no guarding. ostomy site is intact with watery dark stool. Hernia noted. Incision sites all WNL Musculoskeletal: Normal range of motion. She exhibits no edema.  Lymphadenopathy:    She has no cervical adenopathy.  Neurological: She is alert and oriented to person, place, and time. She has normal reflexes. No cranial nerve deficit.  Skin: Skin is warm and dry. No rash noted.  Psychiatric: Mood, memory, affect and judgment normal.  Nursing note and vitals reviewed.   LABORATORY DATA: I have reviewed the data as listed. CBC    Component Value Date/Time   WBC 4.9 05/04/2015 1048   RBC 3.60* 05/04/2015 1048   RBC 3.45* 11/24/2008 1500   HGB 11.7* 05/04/2015 1048   HCT 34.8* 05/04/2015 1048   PLT 186 05/04/2015 1048   MCV 96.7 05/04/2015 1048   MCH 32.5 05/04/2015 1048   MCHC 33.6 05/04/2015 1048   RDW 15.8* 05/04/2015 1048   LYMPHSABS 0.9 05/04/2015 1048   MONOABS 0.6 05/04/2015 1048   EOSABS 0.1 05/04/2015 1048   BASOSABS 0.0 05/04/2015 1048   CMP     Component Value Date/Time  NA 141 05/04/2015 1048   K 3.7 05/04/2015 1048   CL 107 05/04/2015 1048   CO2 25 05/04/2015 1048   GLUCOSE 101* 05/04/2015 1048   BUN 21* 05/04/2015 1048   CREATININE 1.05* 05/04/2015 1048   CREATININE 0.95* 12/07/2014 1512   CALCIUM 9.4 05/04/2015 1048   PROT 7.4 05/04/2015 1048   ALBUMIN 3.8 05/04/2015 1048   AST 24 05/04/2015 1048   ALT 14 05/04/2015  1048   ALKPHOS 68 05/04/2015 1048   BILITOT 0.6 05/04/2015 1048   GFRNONAA 48* 05/04/2015 1048   GFRNONAA 56* 12/07/2014 1512   GFRAA 18* 05/04/2015 1048   GFRAA 64 12/07/2014 1512   RADIOLOGY: I have reviewed the images detailed below and agree with the results:  Study Result     CLINICAL DATA: 82 year old female with history of colon cancer with metastatic disease to the liver. Currently undergoing chemotherapy. Followup study.  EXAM: CT CHEST, ABDOMEN, AND PELVIS WITH CONTRAST  TECHNIQUE: Multidetector CT imaging of the chest, abdomen and pelvis was performed following the standard protocol during bolus administration of intravenous contrast.  CONTRAST: 55m OMNIPAQUE IOHEXOL 300 MG/ML SOLN  COMPARISON: Multiple priors, most recently PET-CT 01/18/2015.  FINDINGS: CT CHEST FINDINGS  Mediastinum/Lymph Nodes: Heart size is mildly enlarged. There is no significant pericardial fluid, thickening or pericardial calcification. There is atherosclerosis of the thoracic aorta, the great vessels of the mediastinum and the coronary arteries, including calcified atherosclerotic plaque in the left anterior descending and left circumflex coronary arteries. Calcifications of the mitral annulus. No pathologically enlarged mediastinal or hilar lymph nodes. Mild circumferential thickening of the distal half of the esophagus. No discrete esophageal mass identified. No axillary lymphadenopathy.  Lungs/Pleura: A few scattered tiny sub cm subpleural nodules are noted, largest of which is a 3 mm nodule in the posterior aspect of the left lower lobe. These are unchanged, presumably a benign subpleural lymph nodes. No other larger more suspicious appearing pulmonary nodules or masses are noted to suggest metastatic disease to the lungs. Mild scarring in the right lower lobe. No acute consolidative airspace disease. No pleural effusions.  Musculoskeletal/Soft Tissues: There are  no aggressive appearing lytic or blastic lesions noted in the visualized portions of the skeleton.  CT ABDOMEN AND PELVIS FINDINGS  Hepatobiliary: Interval increase in size of a hypovascular heterogeneously enhancing lesion in segments 2 and 3 of the liver, which currently measures 3.1 x 2.0 x 2.3 cm (axial image 48 of series 7 and coronal image 21 of series 9). There is also a a 7 mm well-defined low-attenuation lesion in the central aspect of segment 7 of the liver, too small to definitively characterize, but similar to the prior study, likely tiny cysts. No new hepatic lesions are noted. No intra or extrahepatic biliary ductal dilatation. Status post cholecystectomy.  Pancreas: Fatty infiltration in the head of the pancreas, similar prior studies. No pancreatic mass. No pancreatic ductal dilatation. No pancreatic or peripancreatic fluid or inflammatory changes.  Spleen: Unremarkable.  Adrenals/Urinary Tract: In the anterior aspect of the lower pole of the left kidney there is a well-circumscribed 1.1 x 1.4 cm lesion (image 33 of series 2) which measures 62 HU on precontrast images, 63 HU on arterial phase images, 79 HU on portal venous phase images, and 68 HU on delayed images. This lesion is only slightly larger than prior study 07/13/2014, at which point this measured 12 x 9 mm. 1.8 cm low-attenuation lesion in the interpolar region of the right kidney is compatible with a  simple cyst. Sub cm low-attenuation lesion in the interpolar region of the left kidney is too small to definitively characterize, but is likely a cyst. No hydroureteronephrosis or perinephric stranding. Urinary bladder is obscured by beam hardening artifact from the patient's left hip arthroplasty. Bilateral adrenal glands are normal in appearance.  Stomach/Bowel: Normal appearance of the stomach. No pathologic dilatation of small bowel or remaining colon. Status post right hemicolectomy with right  lower quadrant ileostomy. Laxity of the lower right anterior abdominal wall. Small parastomal hernia containing several loops of small bowel. Notably, although the remaining portions of transverse, descending, sigmoid colon and rectum are decompressed, in the left lower quadrant (image 85 of series 7) there is a new soft tissue mass which appears to be intimately associated with the colon at the junction of descending colon and sigmoid colon, which measures approximately 2.9 x 4.1 x 4.5 cm, concerning for new colonic neoplasm. The possibility of a peritoneal implant in this region is not entirely excluded.  Vascular/Lymphatic: Atherosclerosis throughout the abdominal and pelvic vasculature, without evidence of aneurysm or dissection. No lymphadenopathy noted in the abdomen or pelvis.  Reproductive: Status post hysterectomy. Ovaries are not confidently identified may be surgically absent or atrophic (much of the pelvis is obscured by beam hardening artifact from the patient's left hip prosthesis).  Other: No significant volume of ascites. No pneumoperitoneum.  Musculoskeletal: Status post left total hip arthroplasty. There are no aggressive appearing lytic or blastic lesions noted in the visualized portions of the skeleton.  IMPRESSION: 1. Progression of disease as evidenced by enlargement of the metastatic lesion in segments 2/3 of the liver. 2. In addition, there has been interval development of a 2.9 x 4.1 x 4.5 cm mass which appears intimately associated with the remaining portion of the colon at the junction of the descending colon and sigmoid colon. This appears to arise from this portion of the colon and is favored to represent a new colonic neoplasm. The possibility of a solitary peritoneal implant in this region is not entirely excluded. This could be further evaluated with colonoscopy if clinically appropriate. 3. No signs of metastatic disease to the thorax. 4.  1.1 x 1.4 cm indeterminate lesion in the lower pole of the left kidney. This is favored to represent a proteinaceous cyst with apparent pseudoenhancement on today's examination secondary to the small size of the lesion. However, this lesion was mildly complex on prior MRI of the abdomen 12/08/2014. Strictly speaking, the possibility of a small slow-growing cystic neoplasm is not entirely excluded. Close attention on followup studies is recommended. 5. Circumferential thickening of the distal half of the esophagus. This may simply reflect esophagitis, however, if there is any clinical concern for Barrett's metaplasia or esophageal neoplasia, further evaluation with endoscopy should be considered. 6. Mild cardiomegaly. 7. Atherosclerosis, including 2 vessel coronary artery disease. 8. Additional incidental findings, as above.   Electronically Signed  By: Vinnie Langton M.D.  On: 04/12/2015 15:18     ASSESSMENT and THERAPY PLAN:  Colon carcinoma with permanent ostomy Diversion Colitis Achalasia Iron deficiency, anemia secondary to GI related blood loss Hemeoccult positive stools Rising CEA with CT imaging on 07/13/2014 1.3 cm mass in L hepatic lobe Subcapsular mass in anterior pole of L kidney suspicious for RCC Biopsy of L liver lesion and MWA Left liver lesion 09/01/2014 Taste Alteration KRAS WT XELODA therapy, week on, week off started 01/2015 Progressive disease Difficulty discussing end of life decision making Vectibix   Overall her skin toxicity is  minor. She has some minor periungual "splitting" and was encouraged to use moisturizers, "liquid bandage" application and soak.   She notes she still has 5 patches left, and does not need a refill at the moment.  We again addressed end of life issues which the patient continues to state that "she wants everything available"  We will continue with Vectibix therapy. She will return for her next cycle and I will see her at  that time.   All questions were answered. The patient knows to call the clinic with any problems, questions or concerns. We can certainly see the patient much sooner if necessary.   This document was electronically signed.   This document serves as a record of services personally performed by Ancil Linsey, MD. It was created on her behalf by Toni Amend, a trained medical scribe. The creation of this record is based on the scribe's personal observations and the provider's statements to them. This document has been checked and approved by the attending provider.  I have reviewed the above documentation for accuracy and completeness, and I agree with the above.  Kelby Fam. Whitney Muse, MD

## 2015-05-18 ENCOUNTER — Ambulatory Visit (HOSPITAL_COMMUNITY): Payer: Medicare Other | Admitting: Hematology & Oncology

## 2015-05-18 ENCOUNTER — Encounter (HOSPITAL_COMMUNITY): Payer: Medicare Other | Attending: Hematology and Oncology

## 2015-05-18 VITALS — BP 137/55 | HR 73 | Temp 98.4°F | Resp 16 | Wt 147.0 lb

## 2015-05-18 DIAGNOSIS — C189 Malignant neoplasm of colon, unspecified: Secondary | ICD-10-CM | POA: Diagnosis not present

## 2015-05-18 DIAGNOSIS — Z5112 Encounter for antineoplastic immunotherapy: Secondary | ICD-10-CM | POA: Diagnosis present

## 2015-05-18 DIAGNOSIS — D509 Iron deficiency anemia, unspecified: Secondary | ICD-10-CM | POA: Diagnosis not present

## 2015-05-18 DIAGNOSIS — C787 Secondary malignant neoplasm of liver and intrahepatic bile duct: Secondary | ICD-10-CM

## 2015-05-18 LAB — MAGNESIUM: MAGNESIUM: 1.7 mg/dL (ref 1.7–2.4)

## 2015-05-18 LAB — COMPREHENSIVE METABOLIC PANEL
ALT: 12 U/L — AB (ref 14–54)
AST: 23 U/L (ref 15–41)
Albumin: 4 g/dL (ref 3.5–5.0)
Alkaline Phosphatase: 66 U/L (ref 38–126)
Anion gap: 10 (ref 5–15)
BUN: 27 mg/dL — AB (ref 6–20)
CHLORIDE: 103 mmol/L (ref 101–111)
CO2: 26 mmol/L (ref 22–32)
CREATININE: 1.27 mg/dL — AB (ref 0.44–1.00)
Calcium: 9.1 mg/dL (ref 8.9–10.3)
GFR calc Af Amer: 44 mL/min — ABNORMAL LOW (ref >60)
GFR, EST NON AFRICAN AMERICAN: 38 mL/min — AB (ref >60)
Glucose, Bld: 108 mg/dL — ABNORMAL HIGH (ref 65–99)
Potassium: 4.3 mmol/L (ref 3.5–5.1)
Sodium: 139 mmol/L (ref 135–145)
Total Bilirubin: 0.7 mg/dL (ref 0.3–1.2)
Total Protein: 7.4 g/dL (ref 6.5–8.1)

## 2015-05-18 LAB — CBC WITH DIFFERENTIAL/PLATELET
BASOS ABS: 0 10*3/uL (ref 0.0–0.1)
Basophils Relative: 0 %
EOS PCT: 3 %
Eosinophils Absolute: 0.2 10*3/uL (ref 0.0–0.7)
HEMATOCRIT: 36.9 % (ref 36.0–46.0)
HEMOGLOBIN: 12.1 g/dL (ref 12.0–15.0)
LYMPHS PCT: 18 %
Lymphs Abs: 0.9 10*3/uL (ref 0.7–4.0)
MCH: 31.8 pg (ref 26.0–34.0)
MCHC: 32.8 g/dL (ref 30.0–36.0)
MCV: 96.9 fL (ref 78.0–100.0)
Monocytes Absolute: 0.6 10*3/uL (ref 0.1–1.0)
Monocytes Relative: 13 %
NEUTROS ABS: 3.2 10*3/uL (ref 1.7–7.7)
NEUTROS PCT: 66 %
PLATELETS: 194 10*3/uL (ref 150–400)
RBC: 3.81 MIL/uL — AB (ref 3.87–5.11)
RDW: 14.4 % (ref 11.5–15.5)
WBC: 4.9 10*3/uL (ref 4.0–10.5)

## 2015-05-18 MED ORDER — SODIUM CHLORIDE 0.9% FLUSH: 10.0000 mL | INTRAVENOUS | Status: DC | PRN

## 2015-05-18 MED ORDER — SODIUM CHLORIDE 0.9 % IV SOLN
6.0000 mg/kg | Freq: Once | INTRAVENOUS | Status: AC
  Administered 2015-05-18: 400 mg via INTRAVENOUS
  Filled 2015-05-18: qty 20

## 2015-05-18 MED ORDER — SODIUM CHLORIDE 0.9 % IV SOLN
Freq: Once | INTRAVENOUS | Status: AC
  Administered 2015-05-18: 12:00:00 via INTRAVENOUS

## 2015-05-18 MED ORDER — HYDROCODONE-ACETAMINOPHEN 7.5-325 MG PO TABS: 1.0000 | ORAL_TABLET | ORAL | Status: DC | PRN

## 2015-05-18 NOTE — Progress Notes (Signed)
Patient reports she has only been taking magnesium 1 pill 2 times daily at home. RX reads 2 pills 2 times daily and per Dr.Penland instructed patient to take as prescribed. Verbalized understanding.   Tolerated chemo well. Stable on discharge home with family via wheelchair.

## 2015-05-18 NOTE — Patient Instructions (Signed)
[facility] Discharge Instructions for Patients Receiving Chemotherapy   Beginning January 23rd 2017 lab work for the [facility] will be done in the  Main lab at [facility] on 1st floor. If you have a lab appointment with the [facility] please come in thru the  Main Entrance and check in at the main information desk   Today you received the following chemotherapy agents Vectibix.  Be sure to take Magnesium 2 pills 2 times daily at home as prescribed.  To help prevent nausea and vomiting after your treatment, we encourage you to take your nausea medication as instructed.   If you develop nausea and vomiting, or diarrhea that is not controlled by your medication, call the clinic.  The clinic phone number is [phone]. Office hours are Monday-Friday 8:30am-5:00pm.  BELOW ARE SYMPTOMS THAT SHOULD BE REPORTED IMMEDIATELY:  *FEVER GREATER THAN 101.0 F  *CHILLS WITH OR WITHOUT FEVER  NAUSEA AND VOMITING THAT IS NOT CONTROLLED WITH YOUR NAUSEA MEDICATION  *UNUSUAL SHORTNESS OF BREATH  *UNUSUAL BRUISING OR BLEEDING  TENDERNESS IN MOUTH AND THROAT WITH OR WITHOUT PRESENCE OF ULCERS  *URINARY PROBLEMS  *BOWEL PROBLEMS  UNUSUAL RASH Items with * indicate a potential emergency and should be followed up as soon as possible. If you have an emergency after office hours please contact your primary care physician or go to the nearest emergency department.  Please call the clinic during office hours if you have any questions or concerns.   You may also contact the Patient Navigator at [phone] should you have any questions or need assistance in obtaining follow up care.  Resources For Cancer Patients and their Caregivers ? American [employer] Society: Can assist with transportation, wigs, general needs, runs Look Good Feel Better.        437-757-8210 ? Cancer Care: Provides financial assistance, online support groups, medication/co-pay assistance.   1-800-813-HOPE 563-663-3602) ? [place] Assists [place] cancer patients and their families through emotional , educational and financial support.  (423)378-3933 ? [place] DSS Where to apply for food stamps, Medicaid and utility assistance. 9737914550 ? [facility]: Transportation to medical appointments. 9855377321 ? Social Security Administration: May apply for disability if have a Stage IV cancer. 260-572-9680 438-749-8715 ? [employer], Disability and Transit Services: Assists with nutrition, care and transit needs. [phone]

## 2015-05-19 LAB — CEA: CEA: 234.8 ng/mL — AB (ref 0.0–4.7)

## 2015-05-21 ENCOUNTER — Other Ambulatory Visit (HOSPITAL_COMMUNITY): Payer: Self-pay | Admitting: *Deleted

## 2015-05-21 ENCOUNTER — Telehealth (INDEPENDENT_AMBULATORY_CARE_PROVIDER_SITE_OTHER): Payer: Self-pay | Admitting: *Deleted

## 2015-05-21 ENCOUNTER — Encounter (HOSPITAL_COMMUNITY): Payer: Self-pay | Admitting: *Deleted

## 2015-05-21 NOTE — Telephone Encounter (Signed)
Patient called the office and she ask if I would call Domingo Mend , her nurse with [facility]. Linda's cell number is [phone]. She states that she is having a problem with her Ostomy/Stoma and feels that this is r/t her Chemo Therapy.  A referal is needed for Vaughan Basta to see patient , Dr.Rehman is out of office this week. I reached out to Tom at the cancer center. He si going to have one of the nurses there to call and arrange this.  Patient was called and made aware.

## 2015-05-22 ENCOUNTER — Other Ambulatory Visit (HOSPITAL_COMMUNITY): Payer: Self-pay | Admitting: *Deleted

## 2015-05-22 ENCOUNTER — Telehealth (HOSPITAL_COMMUNITY): Payer: Self-pay | Admitting: *Deleted

## 2015-05-22 DIAGNOSIS — C189 Malignant neoplasm of colon, unspecified: Secondary | ICD-10-CM

## 2015-05-22 DIAGNOSIS — C787 Secondary malignant neoplasm of liver and intrahepatic bile duct: Principal | ICD-10-CM

## 2015-05-22 MED ORDER — PROCHLORPERAZINE MALEATE 10 MG PO TABS: 10.0000 mg | ORAL_TABLET | Freq: Four times a day (QID) | ORAL | Status: DC | PRN

## 2015-05-22 MED ORDER — ONDANSETRON 8 MG PO TBDP: 8.0000 mg | ORAL_TABLET | Freq: Three times a day (TID) | ORAL | Status: DC | PRN

## 2015-05-22 NOTE — Telephone Encounter (Signed)
Patient states she is broken out around her anal area. She put nystatin cream and another type of cream on it. She thinks the Vectibix is doing it because it has never done this before. Today she broke out between her legs (thigh area). Pt to be seen on 06/01/15 by Dr. Whitney Muse. [facility] to see patient tomorrow 05/23/15.

## 2015-05-23 DIAGNOSIS — Z86718 Personal history of other venous thrombosis and embolism: Secondary | ICD-10-CM | POA: Diagnosis not present

## 2015-05-23 DIAGNOSIS — K219 Gastro-esophageal reflux disease without esophagitis: Secondary | ICD-10-CM | POA: Diagnosis not present

## 2015-05-23 DIAGNOSIS — I471 Supraventricular tachycardia: Secondary | ICD-10-CM | POA: Diagnosis not present

## 2015-05-23 DIAGNOSIS — I5030 Unspecified diastolic (congestive) heart failure: Secondary | ICD-10-CM | POA: Diagnosis not present

## 2015-05-23 DIAGNOSIS — D509 Iron deficiency anemia, unspecified: Secondary | ICD-10-CM | POA: Diagnosis not present

## 2015-05-23 DIAGNOSIS — C189 Malignant neoplasm of colon, unspecified: Secondary | ICD-10-CM | POA: Diagnosis not present

## 2015-05-23 DIAGNOSIS — Z433 Encounter for attention to colostomy: Secondary | ICD-10-CM | POA: Diagnosis not present

## 2015-05-23 DIAGNOSIS — M159 Polyosteoarthritis, unspecified: Secondary | ICD-10-CM | POA: Diagnosis not present

## 2015-05-23 DIAGNOSIS — E785 Hyperlipidemia, unspecified: Secondary | ICD-10-CM | POA: Diagnosis not present

## 2015-05-23 DIAGNOSIS — H9190 Unspecified hearing loss, unspecified ear: Secondary | ICD-10-CM | POA: Diagnosis not present

## 2015-05-23 DIAGNOSIS — I10 Essential (primary) hypertension: Secondary | ICD-10-CM | POA: Diagnosis not present

## 2015-05-23 DIAGNOSIS — C787 Secondary malignant neoplasm of liver and intrahepatic bile duct: Secondary | ICD-10-CM | POA: Diagnosis not present

## 2015-05-25 DIAGNOSIS — I471 Supraventricular tachycardia: Secondary | ICD-10-CM | POA: Diagnosis not present

## 2015-05-25 DIAGNOSIS — Z433 Encounter for attention to colostomy: Secondary | ICD-10-CM | POA: Diagnosis not present

## 2015-05-25 DIAGNOSIS — I10 Essential (primary) hypertension: Secondary | ICD-10-CM | POA: Diagnosis not present

## 2015-05-25 DIAGNOSIS — C189 Malignant neoplasm of colon, unspecified: Secondary | ICD-10-CM | POA: Diagnosis not present

## 2015-05-25 DIAGNOSIS — I5030 Unspecified diastolic (congestive) heart failure: Secondary | ICD-10-CM | POA: Diagnosis not present

## 2015-05-25 DIAGNOSIS — C787 Secondary malignant neoplasm of liver and intrahepatic bile duct: Secondary | ICD-10-CM | POA: Diagnosis not present

## 2015-05-28 ENCOUNTER — Other Ambulatory Visit (HOSPITAL_COMMUNITY): Payer: Self-pay | Admitting: *Deleted

## 2015-05-28 DIAGNOSIS — I5030 Unspecified diastolic (congestive) heart failure: Secondary | ICD-10-CM | POA: Diagnosis not present

## 2015-05-28 DIAGNOSIS — Z433 Encounter for attention to colostomy: Secondary | ICD-10-CM | POA: Diagnosis not present

## 2015-05-28 DIAGNOSIS — I471 Supraventricular tachycardia: Secondary | ICD-10-CM | POA: Diagnosis not present

## 2015-05-28 DIAGNOSIS — I10 Essential (primary) hypertension: Secondary | ICD-10-CM | POA: Diagnosis not present

## 2015-05-28 DIAGNOSIS — C189 Malignant neoplasm of colon, unspecified: Secondary | ICD-10-CM

## 2015-05-28 DIAGNOSIS — C787 Secondary malignant neoplasm of liver and intrahepatic bile duct: Principal | ICD-10-CM

## 2015-05-28 MED ORDER — FENTANYL 25 MCG/HR TD PT72: 25.0000 ug | MEDICATED_PATCH | TRANSDERMAL | Status: DC

## 2015-05-31 DIAGNOSIS — I5030 Unspecified diastolic (congestive) heart failure: Secondary | ICD-10-CM | POA: Diagnosis not present

## 2015-05-31 DIAGNOSIS — I471 Supraventricular tachycardia: Secondary | ICD-10-CM | POA: Diagnosis not present

## 2015-05-31 DIAGNOSIS — C787 Secondary malignant neoplasm of liver and intrahepatic bile duct: Secondary | ICD-10-CM | POA: Diagnosis not present

## 2015-05-31 DIAGNOSIS — I10 Essential (primary) hypertension: Secondary | ICD-10-CM | POA: Diagnosis not present

## 2015-05-31 DIAGNOSIS — C189 Malignant neoplasm of colon, unspecified: Secondary | ICD-10-CM | POA: Diagnosis not present

## 2015-05-31 DIAGNOSIS — Z433 Encounter for attention to colostomy: Secondary | ICD-10-CM | POA: Diagnosis not present

## 2015-06-01 ENCOUNTER — Encounter (HOSPITAL_COMMUNITY): Payer: Self-pay | Admitting: Hematology & Oncology

## 2015-06-01 ENCOUNTER — Encounter (HOSPITAL_BASED_OUTPATIENT_CLINIC_OR_DEPARTMENT_OTHER): Payer: Medicare Other

## 2015-06-01 ENCOUNTER — Encounter (HOSPITAL_BASED_OUTPATIENT_CLINIC_OR_DEPARTMENT_OTHER): Payer: Medicare Other | Admitting: Hematology & Oncology

## 2015-06-01 VITALS — BP 112/53 | HR 65 | Temp 97.5°F | Resp 16 | Wt 143.8 lb

## 2015-06-01 DIAGNOSIS — D500 Iron deficiency anemia secondary to blood loss (chronic): Secondary | ICD-10-CM

## 2015-06-01 DIAGNOSIS — R234 Changes in skin texture: Secondary | ICD-10-CM

## 2015-06-01 DIAGNOSIS — C787 Secondary malignant neoplasm of liver and intrahepatic bile duct: Secondary | ICD-10-CM

## 2015-06-01 DIAGNOSIS — L570 Actinic keratosis: Secondary | ICD-10-CM | POA: Diagnosis not present

## 2015-06-01 DIAGNOSIS — C189 Malignant neoplasm of colon, unspecified: Secondary | ICD-10-CM

## 2015-06-01 DIAGNOSIS — Z5112 Encounter for antineoplastic immunotherapy: Secondary | ICD-10-CM

## 2015-06-01 DIAGNOSIS — L821 Other seborrheic keratosis: Secondary | ICD-10-CM | POA: Diagnosis not present

## 2015-06-01 DIAGNOSIS — D485 Neoplasm of uncertain behavior of skin: Secondary | ICD-10-CM | POA: Diagnosis not present

## 2015-06-01 DIAGNOSIS — Z7189 Other specified counseling: Secondary | ICD-10-CM

## 2015-06-01 DIAGNOSIS — R239 Unspecified skin changes: Secondary | ICD-10-CM

## 2015-06-01 DIAGNOSIS — T451X5A Adverse effect of antineoplastic and immunosuppressive drugs, initial encounter: Secondary | ICD-10-CM

## 2015-06-01 DIAGNOSIS — D509 Iron deficiency anemia, unspecified: Secondary | ICD-10-CM | POA: Diagnosis not present

## 2015-06-01 LAB — COMPREHENSIVE METABOLIC PANEL
ALK PHOS: 65 U/L (ref 38–126)
ALT: 12 U/L — AB (ref 14–54)
AST: 20 U/L (ref 15–41)
Albumin: 3.8 g/dL (ref 3.5–5.0)
Anion gap: 10 (ref 5–15)
BILIRUBIN TOTAL: 0.3 mg/dL (ref 0.3–1.2)
BUN: 45 mg/dL — AB (ref 6–20)
CALCIUM: 9.4 mg/dL (ref 8.9–10.3)
CO2: 23 mmol/L (ref 22–32)
CREATININE: 1.48 mg/dL — AB (ref 0.44–1.00)
Chloride: 106 mmol/L (ref 101–111)
GFR, EST AFRICAN AMERICAN: 37 mL/min — AB (ref >60)
GFR, EST NON AFRICAN AMERICAN: 32 mL/min — AB (ref >60)
Glucose, Bld: 121 mg/dL — ABNORMAL HIGH (ref 65–99)
Potassium: 4.2 mmol/L (ref 3.5–5.1)
Sodium: 139 mmol/L (ref 135–145)
TOTAL PROTEIN: 7.4 g/dL (ref 6.5–8.1)

## 2015-06-01 LAB — CBC WITH DIFFERENTIAL/PLATELET
BASOS ABS: 0 10*3/uL (ref 0.0–0.1)
Basophils Relative: 0 %
EOS PCT: 4 %
Eosinophils Absolute: 0.2 10*3/uL (ref 0.0–0.7)
HEMATOCRIT: 35.2 % — AB (ref 36.0–46.0)
Hemoglobin: 11.8 g/dL — ABNORMAL LOW (ref 12.0–15.0)
LYMPHS ABS: 1.1 10*3/uL (ref 0.7–4.0)
LYMPHS PCT: 20 %
MCH: 32 pg (ref 26.0–34.0)
MCHC: 33.5 g/dL (ref 30.0–36.0)
MCV: 95.4 fL (ref 78.0–100.0)
Monocytes Absolute: 0.6 10*3/uL (ref 0.1–1.0)
Monocytes Relative: 11 %
NEUTROS ABS: 3.4 10*3/uL (ref 1.7–7.7)
Neutrophils Relative %: 65 %
PLATELETS: 207 10*3/uL (ref 150–400)
RBC: 3.69 MIL/uL — AB (ref 3.87–5.11)
RDW: 13.4 % (ref 11.5–15.5)
WBC: 5.3 10*3/uL (ref 4.0–10.5)

## 2015-06-01 LAB — MAGNESIUM: MAGNESIUM: 1.6 mg/dL — AB (ref 1.7–2.4)

## 2015-06-01 MED ORDER — SODIUM CHLORIDE 0.9 % IV SOLN
Freq: Once | INTRAVENOUS | Status: AC
Start: 2015-06-01 — End: 2015-06-01
  Administered 2015-06-01: 10:00:00 via INTRAVENOUS

## 2015-06-01 MED ORDER — BACITRACIN-POLYMYXIN B OP OINT: 1.0000 "application " | TOPICAL_OINTMENT | Freq: Two times a day (BID) | OPHTHALMIC | Status: DC

## 2015-06-01 MED ORDER — BACITRACIN-POLYMYXIN B OP OINT
1.0000 | TOPICAL_OINTMENT | Freq: Two times a day (BID) | OPHTHALMIC | Status: DC
Start: 2015-06-01 — End: 2015-06-01

## 2015-06-01 MED ORDER — ERYTHROMYCIN 5 MG/GM OP OINT: 1.0000 "application " | TOPICAL_OINTMENT | Freq: Three times a day (TID) | OPHTHALMIC | Status: DC

## 2015-06-01 MED ORDER — SODIUM CHLORIDE 0.9% FLUSH: 10.0000 mL | INTRAVENOUS | Status: DC | PRN

## 2015-06-01 MED ORDER — SODIUM CHLORIDE 0.9 % IV SOLN
6.0000 mg/kg | Freq: Once | INTRAVENOUS | Status: AC
  Administered 2015-06-01: 400 mg via INTRAVENOUS
  Filled 2015-06-01: qty 20

## 2015-06-01 NOTE — Progress Notes (Signed)
[facility] [place], MD [address] / [place] [address]   DIAGNOSIS: Colon carcinoma with permanent ostomy Diversion Colitis Achalasia Iron deficiency, anemia secondary to GI related blood loss Hemeoccult positive stools Rising CEA with CT imaging on 07/13/2014 1.3 cm mass in L hepatic lobe Subcapsular mass in anterior pole of L kidney suspicious for RCC Cutaneous microwave ablation of her now biopsy-proven metastatic colonic adenocarcinoma to the left liver 09/01/2014   Colon cancer metastasized to liver [facility])   04/12/1987 Definitive Surgery Dr. Lindalou Hose at [facility]   09/01/2014 Pathology Results Liver, needle/core biopsy - METASTATIC ADENOCARCINOMA.   09/01/2014 Miscellaneous MWA left liver lesion, [name] (IR)   01/18/2015 Progression PET scan   01/18/2015 PET scan Recurrence of  tumor within segment 2 of the liver. There are 2 new lesions identified within segment 5 and segment 6 of the liver worrisome for additional sites of metastasis. Evidence of tumor recurrence at the intra colonic anastomosis is noted...   01/28/2015 - 04/16/2015 Chemotherapy Xeloda 1500 mg BID 7 days on and 7 days off beginning in December 2016.   04/12/2015 Imaging CT C/A/P progression of disease, enlargement of metastatic lesion in segments 2/3 of liver, development of mass with colon at junction of descending colon and sigmoid, indeterminate lesion in lower pole of the left kidney   04/20/2015 -  Antibody Plan Vectibix every 2 weeks, single-agent.   CURRENT THERAPY:  Vectibix IV iron prn  History of Present Illness: Karen [name] returns today for further follow-up of stage IV CRC s/p microwave ablation of a solitary liver lesion. Unfortunately repeat imaging in December revealed other liver disease and recurrence at the prior anastomotic site.  She also has a L renal lesion that is under observation. She has other health issues including chronic  LBP. She has progressed through [place]. She is currently on Vectibix.  Karen [name] returns to the [facility] today accompanied by her daughter and husband. She is in a back room receiving treatment.  Regarding how she's feeling today, she remarks "I'm still here. Moaning and groaning, but I'm still here."  She notes that her hands are still sore, "very sore." She also says that she had a "crust" all around her nose, adding that her nose is sore and keeps bleeding all the time. She also notes that her eyes have been bothering her.  She comments that her ear has been sore, making it difficult to get her hearing aid in. She denies throat soreness, but says that under her dentures, the roof of her mouth has been sore. She isn't sure why this is the case. She denies mouth ulcers.   Her ostomy is looking better. She notes that she's been putting it on differently than she was doing before, which has caused the improvement.  She is still using some lotions from [employer] which could be causing some of her skin issues. She notes that she's been wearing the gloves and taking good care of her hands, with her daughter confirming "she doesn't like to wear them, but we fuss at her enough that she wears them." She comments that her hands are very sore, and that they will periodically crack open, be sore, and heal again.  She notes her appetite is unchanged, weight is fairly stable. No nausea or vomiting.   MEDICAL HISTORY: Past Medical History  Diagnosis Date  . Chronic diastolic CHF (congestive heart failure) (  [place])     a. nl EF by echo 05/2009.  Marland Kitchen DVT of deep femoral vein ([place])   . History of PSVT (paroxysmal supraventricular tachycardia)   . Hypertension   . Abdominal adhesions   . Ileostomy present ([place])   . Colon polyp   . Chest pain     a. 2002 Cath: nl cors;  b. 2009 aden mv: nl;  c. 05/2009 Echo: nl. d. 2014: normal nuclear stress test, EF 69%.  . Wears dentures   . Breast lump   .  Hyperlipidemia   . Thyroid disease   . Anemia   . History of blood clots   . Hernia   . Blood transfusion   . Hearing loss   . Arthritis   . GERD (gastroesophageal reflux disease)   . Cancer ([place])     bladder  . Colon cancer ([place])     a. recurrence 2016 with liver mets -  percutaneous liver biopsy and thermal ablation of a liver metastasis by interventional radiology..  . Iron deficiency anemia due to chronic blood loss 07/06/2013  . UTI (urinary tract infection)   . Obstruction of intestine or colon ([place]) 06/2014  . Diabetes mellitus without complication ([place]) 1/44/31    borderline type II; takes no medication for it  . Vision abnormalities 2016    not going blind but having retina problems; might lose ability to see faces  . LBBB (left bundle branch block)   . Asthma     hx of  . Depression   . CKD (chronic kidney disease), stage III     pt. states decreased kidney function  . Headache   . Fibromyalgia   . Achalasia     a. s/p botox injection for achalasia.  . Sinus bradycardia     a. HR 30s in 08/2014 requiring holding of digoxin.  . Family history of colon cancer   . Family history of bladder cancer   . Family history of breast cancer     has Left bundle branch block; Chest pain, exertional; Benign hypertensive heart disease without heart failure; Hypothyroid; Small bowel obstruction, partial ([place]); Dysphagia; Paroxysmal SVT (supraventricular tachycardia) ([place]); Ileostomy status ([place]); Unstable angina ([place]); Parastomal hernia; Iron deficiency anemia due to chronic blood loss; Malabsorption of iron; Acute abdominal pain; Acute renal failure ([place]); Hyperglycemia; UTI (urinary tract infection); Small bowel obstruction ([place]); Abdominal pain, acute; Liver mass, left lobe; Colon cancer metastasized to liver [facility]); Liver lesion; Sinus bradycardia; Family history of colon cancer; Family history of bladder cancer; Family history of breast cancer; Genetic testing; Counseling regarding end  of life decision making; and Neoplasm related pain on her problem list.     is allergic to bactrim; codeine; demerol; lidocaine hcl; morphine and related; dilaudid; nitrofurantoin monohyd macro; lisinopril; and ramipril.   Iron Infusion in March 2016. Mammogram performed in 2015. Reported multiple hernias.   SURGICAL HISTORY: Past Surgical History  Procedure Laterality Date  . Cardiac catheterization  12/10/2000    THE LEFT VENTRICLE IS MILDY DILATED. THERE IS MILD TO MODERATE DIFFUSE HYPOKINESIS WITH EF 35%  . Colon cancer surgery    . Ileostomy    . Esophagogastroduodenoscopy  02/13/2011    Procedure: ESOPHAGOGASTRODUODENOSCOPY (EGD);  Surgeon: Rogene Houston, MD;  Location: [facility];  Service: Endoscopy;  Laterality: N/A;  1030  . Colonoscopy  09/26/2011    Procedure: COLONOSCOPY;  Surgeon: Rogene Houston, MD;  Location: [facility];  Service: Endoscopy;  Laterality: N/A;  215  . Cataract extraction w/phaco  11/13/2011    Procedure: CATARACT EXTRACTION PHACO AND INTRAOCULAR LENS PLACEMENT (IOC);  Surgeon: Tonny Branch, MD;  Location: [facility] ORS;  Service: Ophthalmology;  Laterality: Right;  CDE 12.26  . Cataract extraction w/phaco  12/08/2011    Procedure: CATARACT EXTRACTION PHACO AND INTRAOCULAR LENS PLACEMENT (IOC);  Surgeon: Tonny Branch, MD;  Location: [facility] OR[facility];  Service: Ophthalmology;  Laterality: Left;  CDE 15.11  . Esophagogastroduodenoscopy N/A 09/01/2013    Procedure: ESOPHAGOGASTRODUODENOSCOPY (EGD);  Surgeon: Rogene Houston, MD;  Location: [facility];  Service: Endoscopy;  Laterality: N/A;  830  . Botox injection N/A 09/01/2013    Procedure: BOTOX INJECTION;  Surgeon: Rogene Houston, MD;  Location: [facility];  Service: Endoscopy;  Laterality: N/A;  . Abdominal hysterectomy    . Cholecystectomy    . Joint replacement Left 2008  . Tonsillectomy    . Colon surgery    . Eye surgery  2014    catarac surgery  . Appendectomy    . Ovarian cystectomy 1955    . Ileostomy      . Coronary angioplasty    . Rotator cuff repair    . Breast surgery      right breast biopsy  . Liver biopsy and ablation  09/01/14    SOCIAL HISTORY: Social History   Social History  . Marital Status: Married    Spouse Name: N/A  . Number of Children: 4  . Years of Education: N/A   Occupational History  . Not on file.   Social History Main Topics  . Smoking status: Former Smoker -- 0.25 packs/day for 13 years    Types: Cigarettes    Quit date: 02/11/1963  . Smokeless tobacco: Never Used  . Alcohol Use: No  . Drug Use: No  . Sexual Activity: Not on file   Other Topics Concern  . Not on file   Social History Narrative   Reads inspirational books Has 43 Great-Grandchildren and 57 Grandchildren  Father died 48 Mother died 72 Oldest brother died 31 FAMILY HISTORY: Family History  Problem Relation Age of Onset  . Stroke    . Hypertension    . Heart disease Mother   . Stroke Mother     deceased  . Arthritis Mother   . Bladder Cancer Mother     dx in her 19s  . Colon cancer Mother   . Arthritis Father   . Heart disease Father     decesaed  . Leukemia Father   . Stroke Sister     alive/debilitated  . Hypertension Sister   . Other Sister     paralysis  . Heart disease Brother     bypass surgery  . Arthritis Brother   . Colon cancer Brother   . Bladder Cancer Brother   . Stroke Sister     alive/debilitated  . Diabetes Sister   . Other Brother     stomach problems  . Other Brother     bladder  . Colon cancer Paternal Aunt   . Bladder Cancer Other     dx twice in her 30s-40s  . Breast cancer Other     great niece dx in her early 83s  . Prostate cancer Other     newphew  . Colon polyps Daughter      Review of Systems  Constitutional: Negative for fever, chills, weight loss and malaise/fatigue.  HENT: Positive for hearing loss. Negative for congestion, nosebleeds,  sore throat and tinnitus.   Eyes: Negative for blurred vision, double vision, pain  and discharge.  Respiratory: Negative for cough, hemoptysis, sputum production, shortness of breath and wheezing.   Cardiovascular: Negative for chest pain, palpitations, claudication, leg swelling and PND.  Gastrointestinal:  Negative for heartburn, abdominal pain, diarrhea, constipation, blood in stool and melena. ALTERED TASTE Genitourinary: Negative for dysuria, urgency, frequency and hematuria.  Musculoskeletal: Positive for joint pain. Negative for myalgias and falls.  Skin: EGFR therapy related changes Neurological: Positive for weakness. Negative for dizziness, tingling, tremors, sensory change, speech change, focal weakness, seizures, loss of consciousness and headaches.  Endo/Heme/Allergies: Does not bruise/bleed easily.  Psychiatric/Behavioral: Negative for depression, suicidal ideas, memory loss and substance abuse. The patient is not nervous/anxious and does not have insomnia.   14 point review of systems was performed and is negative except as detailed under history of present illness and above    PHYSICAL EXAMINATION  ECOG PERFORMANCE STATUS: 1 - Symptomatic but completely ambulatory   Vitals - 1 value per visit 0/27/2536  SYSTOLIC 644  DIASTOLIC 53  Pulse 65  Temperature 97.5  Respirations 16  Weight (lb) 143.8  Height   BMI 28.08   Physical Exam  Constitutional: She is oriented to person, place, and time and well-developed, well-nourished, and in no distress. In wheelchair. HENT:  Head: Normocephalic and atraumatic. R ear with small deformity from prior surgery Nose: Nose normal.  Mouth/Throat: Oropharynx is clear and moist. No oropharyngeal exudate.  Eyes: Conjunctivae and EOM are normal. Pupils are equal, round, and reactive to light. Right eye exhibits no discharge. Left eye exhibits no discharge. No scleral icterus.  Neck: Normal range of motion. Neck supple. No tracheal deviation present. No thyromegaly present.  Cardiovascular: Normal rate, regular rhythm  and normal heart sounds.  Exam reveals no gallop and no friction rub.   No murmur heard. Pulmonary/Chest: Effort normal and breath sounds normal. She has no wheezes. She has no rales.  Abdominal: Soft. Bowel sounds are normal. . There is no tenderness. There is no rebound and no guarding. ostomy site is intact with watery dark stool. Hernia noted. Incision sites all WNL Musculoskeletal: Normal range of motion. She exhibits no edema.  Lymphadenopathy:    She has no cervical adenopathy.  Neurological: She is alert and oriented to person, place, and time. She has normal reflexes. No cranial nerve deficit.  Skin: Skin is warm and dry. Mild vectibix facial rash. Mild associated nail changes (grade 1)  Psychiatric: Mood, memory, affect and judgment normal.  Nursing note and vitals reviewed.   LABORATORY DATA: I have reviewed the data as listed. CBC    Component Value Date/Time   WBC 5.3 06/01/2015 0944   RBC 3.69* 06/01/2015 0944   RBC 3.45* 11/24/2008 1500   HGB 11.8* 06/01/2015 0944   HCT 35.2* 06/01/2015 0944   PLT 207 06/01/2015 0944   MCV 95.4 06/01/2015 0944   MCH 32.0 06/01/2015 0944   MCHC 33.5 06/01/2015 0944   RDW 13.4 06/01/2015 0944   LYMPHSABS 1.1 06/01/2015 0944   MONOABS 0.6 06/01/2015 0944   EOSABS 0.2 06/01/2015 0944   BASOSABS 0.0 06/01/2015 0944   CMP     Component Value Date/Time   NA 139 06/01/2015 0944   K 4.2 06/01/2015 0944   CL 106 06/01/2015 0944   CO2 23 06/01/2015 0944   GLUCOSE 121* 06/01/2015 0944   BUN 45* 06/01/2015 0944   CREATININE 1.48* 06/01/2015 0944   CREATININE 0.95* 12/07/2014  1512   CALCIUM 9.4 06/01/2015 0944   PROT 7.4 06/01/2015 0944   ALBUMIN 3.8 06/01/2015 0944   AST 20 06/01/2015 0944   ALT 12* 06/01/2015 0944   ALKPHOS 65 06/01/2015 0944   BILITOT 0.3 06/01/2015 0944   GFRNONAA 32* 06/01/2015 0944   GFRNONAA 56* 12/07/2014 1512   GFRAA 37* 06/01/2015 0944   GFRAA 64 12/07/2014 1512   Results for Karen [name], Karen [name] (MRN  [id number]) as of 06/01/2015 16:02  Ref. Range 03/07/2015 08:57 04/06/2015 12:26 04/20/2015 10:14 05/18/2015 10:15  CEA Latest Ref Range: 0.0-4.7 ng/mL 71.6 (H) 96.0 (H) 109.6 (H) 234.8 (H)    RADIOLOGY: I have reviewed the images detailed below and agree with the results:  Study Result     CLINICAL DATA: 82 year old female with history of colon cancer with metastatic disease to the liver. Currently undergoing chemotherapy. Followup study.  EXAM: CT CHEST, ABDOMEN, AND PELVIS WITH CONTRAST  TECHNIQUE: Multidetector CT imaging of the chest, abdomen and pelvis was performed following the standard protocol during bolus administration of intravenous contrast.  CONTRAST: 42m OMNIPAQUE IOHEXOL 300 MG/ML SOLN  COMPARISON: Multiple priors, most recently PET-CT 01/18/2015.  FINDINGS: CT CHEST FINDINGS  Mediastinum/Lymph Nodes: Heart size is mildly enlarged. There is no significant pericardial fluid, thickening or pericardial calcification. There is atherosclerosis of the thoracic aorta, the great vessels of the mediastinum and the coronary arteries, including calcified atherosclerotic plaque in the left anterior descending and left circumflex coronary arteries. Calcifications of the mitral annulus. No pathologically enlarged mediastinal or hilar lymph nodes. Mild circumferential thickening of the distal half of the esophagus. No discrete esophageal mass identified. No axillary lymphadenopathy.  Lungs/Pleura: A few scattered tiny sub cm subpleural nodules are noted, largest of which is a 3 mm nodule in the posterior aspect of the left lower lobe. These are unchanged, presumably a benign subpleural lymph nodes. No other larger more suspicious appearing pulmonary nodules or masses are noted to suggest metastatic disease to the lungs. Mild scarring in the right lower lobe. No acute consolidative airspace disease. No pleural effusions.  Musculoskeletal/Soft Tissues: There are  no aggressive appearing lytic or blastic lesions noted in the visualized portions of the skeleton.  CT ABDOMEN AND PELVIS FINDINGS  Hepatobiliary: Interval increase in size of a hypovascular heterogeneously enhancing lesion in segments 2 and 3 of the liver, which currently measures 3.1 x 2.0 x 2.3 cm (axial image 48 of series 7 and coronal image 21 of series 9). There is also a a 7 mm well-defined low-attenuation lesion in the central aspect of segment 7 of the liver, too small to definitively characterize, but similar to the prior study, likely tiny cysts. No new hepatic lesions are noted. No intra or extrahepatic biliary ductal dilatation. Status post cholecystectomy.  Pancreas: Fatty infiltration in the head of the pancreas, similar prior studies. No pancreatic mass. No pancreatic ductal dilatation. No pancreatic or peripancreatic fluid or inflammatory changes.  Spleen: Unremarkable.  Adrenals/Urinary Tract: In the anterior aspect of the lower pole of the left kidney there is a well-circumscribed 1.1 x 1.4 cm lesion (image 33 of series 2) which measures 62 HU on precontrast images, 63 HU on arterial phase images, 79 HU on portal venous phase images, and 68 HU on delayed images. This lesion is only slightly larger than prior study 07/13/2014, at which point this measured 12 x 9 mm. 1.8 cm low-attenuation lesion in the interpolar region of the right kidney is compatible with a simple cyst. Sub cm low-attenuation  lesion in the interpolar region of the left kidney is too small to definitively characterize, but is likely a cyst. No hydroureteronephrosis or perinephric stranding. Urinary bladder is obscured by beam hardening artifact from the patient's left hip arthroplasty. Bilateral adrenal glands are normal in appearance.  Stomach/Bowel: Normal appearance of the stomach. No pathologic dilatation of small bowel or remaining colon. Status post right hemicolectomy with right  lower quadrant ileostomy. Laxity of the lower right anterior abdominal wall. Small parastomal hernia containing several loops of small bowel. Notably, although the remaining portions of transverse, descending, sigmoid colon and rectum are decompressed, in the left lower quadrant (image 85 of series 7) there is a new soft tissue mass which appears to be intimately associated with the colon at the junction of descending colon and sigmoid colon, which measures approximately 2.9 x 4.1 x 4.5 cm, concerning for new colonic neoplasm. The possibility of a peritoneal implant in this region is not entirely excluded.  Vascular/Lymphatic: Atherosclerosis throughout the abdominal and pelvic vasculature, without evidence of aneurysm or dissection. No lymphadenopathy noted in the abdomen or pelvis.  Reproductive: Status post hysterectomy. Ovaries are not confidently identified may be surgically absent or atrophic (much of the pelvis is obscured by beam hardening artifact from the patient's left hip prosthesis).  Other: No significant volume of ascites. No pneumoperitoneum.  Musculoskeletal: Status post left total hip arthroplasty. There are no aggressive appearing lytic or blastic lesions noted in the visualized portions of the skeleton.  IMPRESSION: 1. Progression of disease as evidenced by enlargement of the metastatic lesion in segments 2/3 of the liver. 2. In addition, there has been interval development of a 2.9 x 4.1 x 4.5 cm mass which appears intimately associated with the remaining portion of the colon at the junction of the descending colon and sigmoid colon. This appears to arise from this portion of the colon and is favored to represent a new colonic neoplasm. The possibility of a solitary peritoneal implant in this region is not entirely excluded. This could be further evaluated with colonoscopy if clinically appropriate. 3. No signs of metastatic disease to the thorax. 4.  1.1 x 1.4 cm indeterminate lesion in the lower pole of the left kidney. This is favored to represent a proteinaceous cyst with apparent pseudoenhancement on today's examination secondary to the small size of the lesion. However, this lesion was mildly complex on prior MRI of the abdomen 12/08/2014. Strictly speaking, the possibility of a small slow-growing cystic neoplasm is not entirely excluded. Close attention on followup studies is recommended. 5. Circumferential thickening of the distal half of the esophagus. This may simply reflect esophagitis, however, if there is any clinical concern for Barrett's metaplasia or esophageal neoplasia, further evaluation with endoscopy should be considered. 6. Mild cardiomegaly. 7. Atherosclerosis, including 2 vessel coronary artery disease. 8. Additional incidental findings, as above.   Electronically Signed  By: Vinnie Langton M.D.  On: 04/12/2015 15:18     ASSESSMENT and THERAPY PLAN:  Colon carcinoma with permanent ostomy Diversion Colitis Achalasia Iron deficiency, anemia secondary to GI related blood loss Hemeoccult positive stools Rising CEA with CT imaging on 07/13/2014 1.3 cm mass in L hepatic lobe Subcapsular mass in anterior pole of L kidney suspicious for RCC Biopsy of L liver lesion and MWA Left liver lesion 09/01/2014 Taste Alteration KRAS WT XELODA therapy, week on, week off started 01/2015 Progressive disease Difficulty discussing end of life decision making MSS disease  I will advise the patient when her CEA returns. We  discussed other options for therapy if she is not responding to vectibix.  I do not think she would do well with FOLFOX or irinotecan based therapy. Her tolerance for even minor side effects is limited.   As previously noted, she has difficulty addressing end of life issues. We will continue to work on this. She remains a full code.   Her current skin toxicities are grade 1. I have encouraged her to  use non-scented lotions on her hands and face.   She was thoroughly advised about her symptoms once more, and treatment options to deal with them. She will return in 2 weeks.  Orders Placed This Encounter  Procedures  . CEA    Standing Status: Standing     Number of Occurrences: 10     Standing Expiration Date: 05/31/2016   All questions were answered. The patient knows to call the clinic with any problems, questions or concerns. We can certainly see the patient much sooner if necessary.   This document was electronically signed.   This document serves as a record of services personally performed by Ancil Linsey, MD. It was created on her behalf by Toni Amend, a trained medical scribe. The creation of this record is based on the scribe's personal observations and the provider's statements to them. This document has been checked and approved by the attending provider.  I have reviewed the above documentation for accuracy and completeness, and I agree with the above.  Kelby Fam. Penland, MD  Review of Systems

## 2015-06-01 NOTE — Progress Notes (Signed)
Patient tolerated infusion well.  VSS.  Wheeled out of clinic in wheelchair by daughter.

## 2015-06-01 NOTE — Patient Instructions (Addendum)
Why at [facility] Discharge Instructions  RECOMMENDATIONS MADE BY THE CONSULTANT AND ANY TEST RESULTS WILL BE SENT TO YOUR REFERRING PHYSICIAN.    Exam and discussion by Dr Whitney Muse today Vectibex today Bacitracin sent over to your pharmacy, you can use this in both eyes twice a day CEA with every treatment Vectibex in 2 weeks  Return to see the doctor in 2 weeks Please call the clinic if you have any questions or concerns    Thank you for choosing [facility] at [facility] to provide your oncology and hematology care.  To afford each patient quality time with our provider, please arrive at least 15 minutes before your scheduled appointment time.   Beginning January 23rd 2017 lab work for the [name] will be done in the  Main lab at [facility] on 1st floor. If you have a lab appointment with the [facility] please come in thru the  Main Entrance and check in at the main information desk  You need to re-schedule your appointment should you arrive 10 or more minutes late.  We strive to give you quality time with our providers, and arriving late affects you and other patients whose appointments are after yours.  Also, if you no show three or more times for appointments you may be dismissed from the clinic at the providers discretion.     Again, thank you for choosing [facility].  Our hope is that these requests will decrease the amount of time that you wait before being seen by our physicians.       _____________________________________________________________  Should you have questions after your visit to [facility], please contact our office at [phone] between the hours of 8:30 a.m. and 4:30 p.m.  Voicemails left after 4:30 p.m. will not be returned until the following business day.  For prescription refill requests, have your pharmacy contact our office.         Resources For Cancer  Patients and their Caregivers ? American Cancer Society: Can assist with transportation, wigs, general needs, runs Look Good Feel Better.        8601053161 ? Cancer Care: Provides financial assistance, online support groups, medication/co-pay assistance.  1-800-813-HOPE 2507756701) ? [place] Assists [place] cancer patients and their families through emotional , educational and financial support.  831-168-1979 ? [place] DSS Where to apply for food stamps, Medicaid and utility assistance. 225-831-7769 ? [employer]: Transportation to medical appointments. 470-097-6365 ? Social Security Administration: May apply for disability if have a Stage IV cancer. 940-394-7461 (229)392-4419 ? [employer], Disability and Transit Services: Assists with nutrition, care and transit needs. 947-617-0030

## 2015-06-01 NOTE — Patient Instructions (Signed)
[facility] Discharge Instructions for Patients Receiving Chemotherapy   Beginning January 23rd 2017 lab work for the [facility] will be done in the  Main lab at [facility] on 1st floor. If you have a lab appointment with the [facility] please come in thru the  Main Entrance and check in at the main information desk   Today you received the following chemotherapy agent: Vectibix.     If you develop nausea and vomiting, or diarrhea that is not controlled by your medication, call the clinic.  The clinic phone number is [phone]. Office hours are Monday-Friday 8:30am-5:00pm.  BELOW ARE SYMPTOMS THAT SHOULD BE REPORTED IMMEDIATELY:  *FEVER GREATER THAN 101.0 F  *CHILLS WITH OR WITHOUT FEVER  NAUSEA AND VOMITING THAT IS NOT CONTROLLED WITH YOUR NAUSEA MEDICATION  *UNUSUAL SHORTNESS OF BREATH  *UNUSUAL BRUISING OR BLEEDING  TENDERNESS IN MOUTH AND THROAT WITH OR WITHOUT PRESENCE OF ULCERS  *URINARY PROBLEMS  *BOWEL PROBLEMS  UNUSUAL RASH Items with * indicate a potential emergency and should be followed up as soon as possible. If you have an emergency after office hours please contact your primary care physician or go to the nearest emergency department.  Please call the clinic during office hours if you have any questions or concerns.   You may also contact the Patient Navigator at [phone] should you have any questions or need assistance in obtaining follow up care.      Resources For Cancer Patients and their Caregivers ? American Cancer Society: Can assist with transportation, wigs, general needs, runs Look Good Feel Better.        331-124-9674 ? Cancer Care: Provides financial assistance, online support groups, medication/co-pay assistance.  1-800-813-HOPE (605) 408-3239) ? [place] Assists [place] cancer patients and their families through emotional , educational and financial support.   (920)706-6741 ? [place] DSS Where to apply for food stamps, Medicaid and utility assistance. 743-540-7507 ? [employer]: Transportation to medical appointments. (514)122-8475 ? Social Security Administration: May apply for disability if have a Stage IV cancer. 5313803670 743-531-4961 ? [employer], Disability and Transit Services: Assists with nutrition, care and transit needs. [phone]

## 2015-06-02 LAB — CEA: CEA: 161 ng/mL — AB (ref 0.0–4.7)

## 2015-06-05 DIAGNOSIS — Z433 Encounter for attention to colostomy: Secondary | ICD-10-CM | POA: Diagnosis not present

## 2015-06-05 DIAGNOSIS — C787 Secondary malignant neoplasm of liver and intrahepatic bile duct: Secondary | ICD-10-CM | POA: Diagnosis not present

## 2015-06-05 DIAGNOSIS — I5030 Unspecified diastolic (congestive) heart failure: Secondary | ICD-10-CM | POA: Diagnosis not present

## 2015-06-05 DIAGNOSIS — C189 Malignant neoplasm of colon, unspecified: Secondary | ICD-10-CM | POA: Diagnosis not present

## 2015-06-05 DIAGNOSIS — I10 Essential (primary) hypertension: Secondary | ICD-10-CM | POA: Diagnosis not present

## 2015-06-05 DIAGNOSIS — I471 Supraventricular tachycardia: Secondary | ICD-10-CM | POA: Diagnosis not present

## 2015-06-11 ENCOUNTER — Telehealth (HOSPITAL_COMMUNITY): Payer: Self-pay | Admitting: *Deleted

## 2015-06-11 ENCOUNTER — Encounter (HOSPITAL_COMMUNITY): Payer: Self-pay

## 2015-06-11 ENCOUNTER — Encounter (HOSPITAL_COMMUNITY): Payer: Medicare Other | Attending: Hematology and Oncology

## 2015-06-11 VITALS — BP 100/51 | HR 71 | Temp 98.0°F | Resp 16

## 2015-06-11 DIAGNOSIS — C189 Malignant neoplasm of colon, unspecified: Secondary | ICD-10-CM

## 2015-06-11 DIAGNOSIS — D509 Iron deficiency anemia, unspecified: Secondary | ICD-10-CM | POA: Insufficient documentation

## 2015-06-11 DIAGNOSIS — C787 Secondary malignant neoplasm of liver and intrahepatic bile duct: Secondary | ICD-10-CM

## 2015-06-11 NOTE — Patient Instructions (Signed)
Diarrhea Diarrhea is watery poop (stool). It can make you feel weak, tired, thirsty, or give you a dry mouth (signs of dehydration). Watery poop is a sign of another problem, most often an infection. It often lasts 2-3 days. It can last longer if it is a sign of something serious. Take care of yourself as told by your doctor. HOME CARE   Drink 1 cup (8 ounces) of fluid each time you have watery poop.  Do not drink the following fluids:  Those that contain simple sugars (fructose, glucose, galactose, lactose, sucrose, maltose).  Sports drinks.  Fruit juices.  Whole milk products.  Sodas.  Drinks with caffeine (coffee, tea, soda) or alcohol.  Oral rehydration solution may be used if the doctor says it is okay. You may make your own solution. Follow this recipe:   - teaspoon table salt.   teaspoon baking soda.   teaspoon salt substitute containing potassium chloride.  1 tablespoons sugar.  1 liter (34 ounces) of water.  Avoid the following foods:  High fiber foods, such as raw fruits and vegetables.  Nuts, seeds, and whole grain breads and cereals.   Those that are sweetened with sugar alcohols (xylitol, sorbitol, mannitol).  Try eating the following foods:  Starchy foods, such as rice, toast, pasta, low-sugar cereal, oatmeal, baked potatoes, crackers, and bagels.  Bananas.  Applesauce.  Eat probiotic-rich foods, such as yogurt and milk products that are fermented.  Wash your hands well after each time you have watery poop.  Only take medicine as told by your doctor.  Take a warm bath to help lessen burning or pain from having watery poop. GET HELP RIGHT AWAY IF:   You cannot drink fluids without throwing up (vomiting).  You keep throwing up.  You have blood in your poop, or your poop looks black and tarry.  You do not pee (urinate) in 6-8 hours, or there is only a small amount of very dark pee.  You have belly (abdominal) pain that gets worse  or stays in the same spot (localizes).  You are weak, dizzy, confused, or light-headed.  You have a very bad headache.  Your watery poop gets worse or does not get better.  You have a fever or lasting symptoms for more than 2-3 days.  You have a fever and your symptoms suddenly get worse. MAKE SURE YOU:   Understand these instructions.  Will watch your condition.  Will get help right away if you are not doing well or get worse.   This information is not intended to replace advice given to you by your health care provider. Make sure you discuss any questions you have with your health care provider.   Document Released: 07/16/2007 Document Revised: 02/17/2014 Document Reviewed: 10/05/2011 Elsevier Interactive Patient Education 2016 [place] now, take 2 tablets and then 1 every 2 hrs until you go 12 hrs without diarrhea Call us tomorrow to let us know how you are  Clostridium Difficile Infection Clostridium difficile (C. difficile or C. diff) is a germ found in the intestines. C. difficile infection can happen after taking antibiotic medicines. Antiobiotics may cause the C. difficile to grow out of control and make a toxin that causes the infection. C. difficile infection can cause watery poop (diarrhea) or severe disease. HOME CARE  Drink enough fluids to keep your pee (urine) clear or pale yellow. Avoid milk, caffeine, and alcohol.  Ask your doctor how to replace the  body fluids you lost (rehydrate).  Eat small meals often. Avoid eating large meals.  Take your antibiotics as told. Finish them even if you start to feel better.  Do not  take medicines that help watery poop. These medicines may stop you from healing as fast or cause problems.  Wash your hands after using the bathroom and before touching food. Make sure people who live with you wash their hands often.  Clean all surfaces in your home. Use a product that has chlorine bleach in it. GET HELP  IF:  Your watery poop lasts longer than expected.  Your watery poop comes back after you finish your antibiotic medicine.  You feel very dry or thirsty (dehydrated).  You have a fever. GET HELP RIGHT AWAY IF:   You have more stomach pain or tenderness.  You have blood in your poop (stool). Your poop may look black and tar-like.  You cannot eat or drink without throwing up (vomiting).   This information is not intended to replace advice given to you by your health care provider. Make sure you discuss any questions you have with your health care provider.   Document Released: 11/24/2008 Document Revised: 02/17/2014 Document Reviewed: 07/31/2014 Elsevier Interactive Patient Education 2016 [employer] Clostridium Difficile Infection Clostridium difficile (C. difficile or C. diff) is a germ found in the intestines. C. difficile infection can happen after taking antibiotic medicines. Antiobiotics may cause the C. difficile to grow out of control and make a toxin that causes the infection. C. difficile infection can cause watery poop (diarrhea) or severe disease. HOME CARE  Drink enough fluids to keep your pee (urine) clear or pale yellow. Avoid milk, caffeine, and alcohol.  Ask your doctor how to replace the body fluids you lost (rehydrate).  Eat small meals often. Avoid eating large meals.  Take your antibiotics as told. Finish them even if you start to feel better.  Do not  take medicines that help watery poop. These medicines may stop you from healing as fast or cause problems.  Wash your hands after using the bathroom and before touching food. Make sure people who live with you wash their hands often.  Clean all surfaces in your home. Use a product that has chlorine bleach in it. GET HELP IF:  Your watery poop lasts longer than expected.  Your watery poop comes back after you finish your antibiotic medicine.  You feel very dry or thirsty (dehydrated).  You have a  fever. GET HELP RIGHT AWAY IF:   You have more stomach pain or tenderness.  You have blood in your poop (stool). Your poop may look black and tar-like.  You cannot eat or drink without throwing up (vomiting).   This information is not intended to replace advice given to you by your health care provider. Make sure you discuss any questions you have with your health care provider.   Document Released: 11/24/2008 Document Revised: 02/17/2014 Document Reviewed: 07/31/2014 Elsevier Interactive Patient Education [employer].

## 2015-06-11 NOTE — Telephone Encounter (Signed)
Patient called and left message stating that she was very nauseated and that her stomach was hurting. She was wondering if she could take a Zofran after taking a Compazine. I have reviewed this with her in the past a few times but I think she needed reassurance that it was ok to take a Compazine and Zofran in close time proximity with one another. Pt's husband is in [facility] recovering from open heart surgery. After talking to patient, she states that she is feeling somewhat better. Her nausea is much improved from this morning. But states that she still doesn't feel really good. Pt states that a dermatologist cut a place off of her ear and that it was precancer. She stated that they were calling her in a "chemo" cream. She will return to the dermatologist in 6 weeks.

## 2015-06-11 NOTE — Progress Notes (Signed)
Patient arrived to clinic after phoning in with loose, watery stools starting this am. This has been accompanied by nausea and abdominal pain. Specimen obtained via colostomy bag and sent to lab. Vitals stable and patient educated on imodium use for diarrhea. She will call us tomorrow for update.

## 2015-06-12 ENCOUNTER — Emergency Department (HOSPITAL_COMMUNITY): Payer: Medicare Other

## 2015-06-12 ENCOUNTER — Other Ambulatory Visit (HOSPITAL_COMMUNITY): Payer: Self-pay | Admitting: *Deleted

## 2015-06-12 ENCOUNTER — Encounter (HOSPITAL_COMMUNITY): Payer: Self-pay

## 2015-06-12 ENCOUNTER — Other Ambulatory Visit: Payer: Self-pay

## 2015-06-12 ENCOUNTER — Observation Stay (HOSPITAL_COMMUNITY)
Admission: EM | Admit: 2015-06-12 | Discharge: 2015-06-13 | Disposition: A | Discharge status: Home / Self Care | Payer: Medicare Other | Attending: Internal Medicine | Admitting: Internal Medicine

## 2015-06-12 DIAGNOSIS — E860 Dehydration: Secondary | ICD-10-CM | POA: Diagnosis not present

## 2015-06-12 DIAGNOSIS — C189 Malignant neoplasm of colon, unspecified: Secondary | ICD-10-CM | POA: Diagnosis present

## 2015-06-12 DIAGNOSIS — C787 Secondary malignant neoplasm of liver and intrahepatic bile duct: Principal | ICD-10-CM

## 2015-06-12 DIAGNOSIS — N183 Chronic kidney disease, stage 3 unspecified: Secondary | ICD-10-CM

## 2015-06-12 DIAGNOSIS — I959 Hypotension, unspecified: Principal | ICD-10-CM

## 2015-06-12 DIAGNOSIS — Z85038 Personal history of other malignant neoplasm of large intestine: Secondary | ICD-10-CM | POA: Insufficient documentation

## 2015-06-12 DIAGNOSIS — Z8551 Personal history of malignant neoplasm of bladder: Secondary | ICD-10-CM | POA: Insufficient documentation

## 2015-06-12 DIAGNOSIS — E039 Hypothyroidism, unspecified: Secondary | ICD-10-CM

## 2015-06-12 DIAGNOSIS — J45909 Unspecified asthma, uncomplicated: Secondary | ICD-10-CM | POA: Insufficient documentation

## 2015-06-12 DIAGNOSIS — Z87891 Personal history of nicotine dependence: Secondary | ICD-10-CM | POA: Diagnosis not present

## 2015-06-12 DIAGNOSIS — R197 Diarrhea, unspecified: Secondary | ICD-10-CM | POA: Diagnosis not present

## 2015-06-12 DIAGNOSIS — N390 Urinary tract infection, site not specified: Secondary | ICD-10-CM | POA: Diagnosis not present

## 2015-06-12 DIAGNOSIS — M1990 Unspecified osteoarthritis, unspecified site: Secondary | ICD-10-CM | POA: Insufficient documentation

## 2015-06-12 DIAGNOSIS — N179 Acute kidney failure, unspecified: Secondary | ICD-10-CM | POA: Diagnosis not present

## 2015-06-12 DIAGNOSIS — D500 Iron deficiency anemia secondary to blood loss (chronic): Secondary | ICD-10-CM | POA: Diagnosis present

## 2015-06-12 DIAGNOSIS — R42 Dizziness and giddiness: Secondary | ICD-10-CM | POA: Diagnosis not present

## 2015-06-12 DIAGNOSIS — N3000 Acute cystitis without hematuria: Secondary | ICD-10-CM

## 2015-06-12 DIAGNOSIS — Z79899 Other long term (current) drug therapy: Secondary | ICD-10-CM | POA: Insufficient documentation

## 2015-06-12 DIAGNOSIS — R778 Other specified abnormalities of plasma proteins: Secondary | ICD-10-CM | POA: Diagnosis present

## 2015-06-12 DIAGNOSIS — E785 Hyperlipidemia, unspecified: Secondary | ICD-10-CM | POA: Insufficient documentation

## 2015-06-12 DIAGNOSIS — R109 Unspecified abdominal pain: Secondary | ICD-10-CM

## 2015-06-12 DIAGNOSIS — E119 Type 2 diabetes mellitus without complications: Secondary | ICD-10-CM | POA: Diagnosis not present

## 2015-06-12 DIAGNOSIS — F329 Major depressive disorder, single episode, unspecified: Secondary | ICD-10-CM | POA: Diagnosis not present

## 2015-06-12 DIAGNOSIS — R7989 Other specified abnormal findings of blood chemistry: Secondary | ICD-10-CM

## 2015-06-12 LAB — COMPREHENSIVE METABOLIC PANEL
ALBUMIN: 3.7 g/dL (ref 3.5–5.0)
ALK PHOS: 67 U/L (ref 38–126)
ALT: 12 U/L — AB (ref 14–54)
AST: 20 U/L (ref 15–41)
Anion gap: 9 (ref 5–15)
BILIRUBIN TOTAL: 0.6 mg/dL (ref 0.3–1.2)
BUN: 41 mg/dL — AB (ref 6–20)
CALCIUM: 9 mg/dL (ref 8.9–10.3)
CO2: 21 mmol/L — ABNORMAL LOW (ref 22–32)
CREATININE: 2.4 mg/dL — AB (ref 0.44–1.00)
Chloride: 105 mmol/L (ref 101–111)
GFR calc Af Amer: 20 mL/min — ABNORMAL LOW (ref >60)
GFR calc non Af Amer: 18 mL/min — ABNORMAL LOW (ref >60)
GLUCOSE: 115 mg/dL — AB (ref 65–99)
Potassium: 4.7 mmol/L (ref 3.5–5.1)
SODIUM: 135 mmol/L (ref 135–145)
Total Protein: 7.3 g/dL (ref 6.5–8.1)

## 2015-06-12 LAB — CBC WITH DIFFERENTIAL/PLATELET
BASOS ABS: 0 10*3/uL (ref 0.0–0.1)
BASOS PCT: 0 %
EOS ABS: 0.2 10*3/uL (ref 0.0–0.7)
Eosinophils Relative: 3 %
HEMATOCRIT: 35.3 % — AB (ref 36.0–46.0)
Hemoglobin: 11.8 g/dL — ABNORMAL LOW (ref 12.0–15.0)
Lymphocytes Relative: 22 %
Lymphs Abs: 1.2 10*3/uL (ref 0.7–4.0)
MCH: 31.8 pg (ref 26.0–34.0)
MCHC: 33.4 g/dL (ref 30.0–36.0)
MCV: 95.1 fL (ref 78.0–100.0)
MONO ABS: 0.5 10*3/uL (ref 0.1–1.0)
MONOS PCT: 9 %
NEUTROS ABS: 3.9 10*3/uL (ref 1.7–7.7)
Neutrophils Relative %: 66 %
PLATELETS: 176 10*3/uL (ref 150–400)
RBC: 3.71 MIL/uL — ABNORMAL LOW (ref 3.87–5.11)
RDW: 13 % (ref 11.5–15.5)
WBC: 5.8 10*3/uL (ref 4.0–10.5)

## 2015-06-12 LAB — URINALYSIS, ROUTINE W REFLEX MICROSCOPIC
BILIRUBIN URINE: NEGATIVE
Glucose, UA: NEGATIVE mg/dL
Hgb urine dipstick: NEGATIVE
KETONES UR: NEGATIVE mg/dL
NITRITE: NEGATIVE
PH: 5 (ref 5.0–8.0)
PROTEIN: NEGATIVE mg/dL
Specific Gravity, Urine: <1.005 — ABNORMAL LOW (ref 1.005–1.030)

## 2015-06-12 LAB — C DIFFICILE QUICK SCREEN W PCR REFLEX
C DIFFICLE (CDIFF) ANTIGEN: NEGATIVE
C Diff interpretation: NEGATIVE
C Diff toxin: NEGATIVE

## 2015-06-12 LAB — I-STAT CG4 LACTIC ACID, ED
Lactic Acid, Venous: 0.76 mmol/L (ref 0.5–2.0)
Lactic Acid, Venous: 0.84 mmol/L (ref 0.5–2.0)

## 2015-06-12 LAB — CREATININE, URINE, RANDOM: CREATININE, URINE: 107.78 mg/dL

## 2015-06-12 LAB — TROPONIN I
Troponin I: 0.05 ng/mL — ABNORMAL HIGH (ref <0.031)
Troponin I: 0.05 ng/mL — ABNORMAL HIGH (ref <0.031)

## 2015-06-12 LAB — URINE MICROSCOPIC-ADD ON: RBC / HPF: NONE SEEN RBC/hpf (ref 0–5)

## 2015-06-12 MED ORDER — ALBUTEROL SULFATE (2.5 MG/3ML) 0.083% IN NEBU: 2.5000 mg | INHALATION_SOLUTION | RESPIRATORY_TRACT | Status: DC | PRN

## 2015-06-12 MED ORDER — SODIUM CHLORIDE 0.9 % IV SOLN: INTRAVENOUS | Status: DC

## 2015-06-12 MED ORDER — CALCIUM CARBONATE 1250 (500 CA) MG PO TABS
500.0000 mg | ORAL_TABLET | Freq: Every day | ORAL | Status: DC
  Administered 2015-06-13: 500 mg via ORAL
  Filled 2015-06-12: qty 1

## 2015-06-12 MED ORDER — HYDROCODONE-ACETAMINOPHEN 7.5-325 MG PO TABS
1.0000 | ORAL_TABLET | ORAL | Status: DC | PRN
  Administered 2015-06-12: 1 via ORAL
  Filled 2015-06-12: qty 1

## 2015-06-12 MED ORDER — NITROGLYCERIN 0.4 MG SL SUBL: 0.4000 mg | SUBLINGUAL_TABLET | SUBLINGUAL | Status: DC | PRN

## 2015-06-12 MED ORDER — DIATRIZOATE MEGLUMINE & SODIUM 66-10 % PO SOLN
ORAL | Status: AC
  Filled 2015-06-12: qty 30

## 2015-06-12 MED ORDER — SODIUM CHLORIDE 0.9 % IV BOLUS (SEPSIS)
1000.0000 mL | Freq: Once | INTRAVENOUS | Status: AC
  Administered 2015-06-12: 1000 mL via INTRAVENOUS

## 2015-06-12 MED ORDER — HEPARIN SODIUM (PORCINE) 5000 UNIT/ML IJ SOLN
5000.0000 [IU] | Freq: Three times a day (TID) | INTRAMUSCULAR | Status: DC
  Administered 2015-06-12 – 2015-06-13 (×2): 5000 [IU] via SUBCUTANEOUS
  Filled 2015-06-12 (×3): qty 1

## 2015-06-12 MED ORDER — ALPRAZOLAM 0.5 MG PO TABS
0.5000 mg | ORAL_TABLET | Freq: Every day | ORAL | Status: DC
  Administered 2015-06-12: 0.5 mg via ORAL
  Filled 2015-06-12: qty 1

## 2015-06-12 MED ORDER — ALUM & MAG HYDROXIDE-SIMETH 200-200-20 MG/5ML PO SUSP
30.0000 mL | Freq: Once | ORAL | Status: AC
  Administered 2015-06-12: 30 mL via ORAL
  Filled 2015-06-12: qty 30

## 2015-06-12 MED ORDER — ONDANSETRON HCL 4 MG PO TABS: 4.0000 mg | ORAL_TABLET | Freq: Four times a day (QID) | ORAL | Status: DC | PRN

## 2015-06-12 MED ORDER — PANTOPRAZOLE SODIUM 40 MG PO TBEC
40.0000 mg | DELAYED_RELEASE_TABLET | Freq: Every day | ORAL | Status: DC
  Administered 2015-06-13: 40 mg via ORAL
  Filled 2015-06-12: qty 1

## 2015-06-12 MED ORDER — PROCHLORPERAZINE MALEATE 5 MG PO TABS: 10.0000 mg | ORAL_TABLET | Freq: Four times a day (QID) | ORAL | Status: DC | PRN

## 2015-06-12 MED ORDER — PANTOPRAZOLE SODIUM 40 MG PO TBEC: 40.0000 mg | DELAYED_RELEASE_TABLET | Freq: Every day | ORAL | Status: DC

## 2015-06-12 MED ORDER — CARVEDILOL 12.5 MG PO TABS
12.5000 mg | ORAL_TABLET | Freq: Two times a day (BID) | ORAL | Status: DC
  Administered 2015-06-13: 12.5 mg via ORAL
  Filled 2015-06-12: qty 1

## 2015-06-12 MED ORDER — CEFTRIAXONE SODIUM 1 G IJ SOLR
1.0000 g | INTRAMUSCULAR | Status: DC
  Filled 2015-06-12: qty 10

## 2015-06-12 MED ORDER — HYOSCYAMINE SULFATE 0.125 MG SL SUBL: 0.1250 mg | SUBLINGUAL_TABLET | Freq: Four times a day (QID) | SUBLINGUAL | Status: DC | PRN

## 2015-06-12 MED ORDER — DEXTROSE 5 % IV SOLN
2.0000 g | Freq: Once | INTRAVENOUS | Status: AC
  Administered 2015-06-12: 2 g via INTRAVENOUS
  Filled 2015-06-12: qty 2

## 2015-06-12 MED ORDER — ADULT MULTIVITAMIN W/MINERALS CH
1.0000 | ORAL_TABLET | Freq: Every day | ORAL | Status: DC
  Filled 2015-06-12: qty 1

## 2015-06-12 MED ORDER — FENTANYL 25 MCG/HR TD PT72
25.0000 ug | MEDICATED_PATCH | TRANSDERMAL | Status: DC
  Administered 2015-06-13: 25 ug via TRANSDERMAL
  Filled 2015-06-12: qty 1

## 2015-06-12 MED ORDER — LEVOTHYROXINE SODIUM 75 MCG PO TABS
75.0000 ug | ORAL_TABLET | Freq: Every day | ORAL | Status: DC
  Administered 2015-06-13: 75 ug via ORAL
  Filled 2015-06-12: qty 1

## 2015-06-12 MED ORDER — SODIUM CHLORIDE 0.9 % IV SOLN
INTRAVENOUS | Status: DC
  Administered 2015-06-12: 22:00:00 via INTRAVENOUS

## 2015-06-12 MED ORDER — ONDANSETRON 4 MG PO TBDP: 8.0000 mg | ORAL_TABLET | Freq: Three times a day (TID) | ORAL | Status: DC | PRN

## 2015-06-12 MED ORDER — ONDANSETRON HCL 4 MG/2ML IJ SOLN: 4.0000 mg | Freq: Four times a day (QID) | INTRAMUSCULAR | Status: DC | PRN

## 2015-06-12 MED ORDER — DIPHENOXYLATE-ATROPINE 2.5-0.025 MG PO TABS: ORAL_TABLET | ORAL | Status: DC

## 2015-06-12 MED ORDER — ONDANSETRON HCL 4 MG/2ML IJ SOLN: 4.0000 mg | Freq: Three times a day (TID) | INTRAMUSCULAR | Status: DC | PRN

## 2015-06-12 MED ORDER — DIPHENOXYLATE-ATROPINE 2.5-0.025 MG PO TABS: 1.0000 | ORAL_TABLET | Freq: Four times a day (QID) | ORAL | Status: DC | PRN

## 2015-06-12 MED ORDER — LEVOTHYROXINE SODIUM 75 MCG PO TABS: 75.0000 ug | ORAL_TABLET | Freq: Every day | ORAL | Status: DC

## 2015-06-12 MED ORDER — ONDANSETRON HCL 4 MG/2ML IJ SOLN
4.0000 mg | Freq: Once | INTRAMUSCULAR | Status: DC
  Filled 2015-06-12: qty 2

## 2015-06-12 MED ORDER — ASPIRIN EC 325 MG PO TBEC
325.0000 mg | DELAYED_RELEASE_TABLET | Freq: Every day | ORAL | Status: DC
  Administered 2015-06-13: 325 mg via ORAL
  Filled 2015-06-12: qty 1

## 2015-06-12 NOTE — Progress Notes (Signed)
Lomotil called into [facility]. Daughter notified of rx call in and how to take.

## 2015-06-12 NOTE — ED Provider Notes (Signed)
CSN: [id number]     Arrival date & time 06/12/15  1242 History  By signing my name below, I, Karen [name], attest that this documentation has been prepared under the direction and in the presence of Karen Chapel, MD. Electronically Signed: Stephania [name], ED Scribe. 06/12/2015. 1:14 PM.    Chief Complaint  Patient presents with  . Hypotension   The history is provided by the patient and a relative. No language interpreter was used.   HPI Comments: Karen [name] is a 82 y.o. female with a history of stage IV colon cancer with metastases to her liver (undergoing chemotherapy, no radiation therapy), who presents to the Emergency Department complaining of hypotension that began this morning. She obtained a reading of 90/44 this morning; she notes she checks every morning. Patient reports light-headedness, nausea, and diarrhea yesterday in her ostomy bag. She states she has emptied her ostomy bag, which was full, about twice yesterday. Her daughter notes she has felt weak this morning. Patient's husband has been undergoing surgery, so she has been spending a large amount of her time with him. Patient states she has had her ostomy bag since 2010.  Patient states her last dose of chemotherapy was given 2 weeks ago, on 05/01/15, and she is scheduled to have her next dose in 3 days.  Patient also notes a crusted rash around her mouth due to her chemotherapy.  She denies any dysuria, hematuria, urinary frequency, cough, vomiting, or chest pain.     Past Medical History  Diagnosis Date  . Chronic diastolic CHF (congestive heart failure) (HCC)     a. nl EF by echo 05/2009.  Marland Kitchen DVT of deep femoral vein ([place])   . History of PSVT (paroxysmal supraventricular tachycardia)   . Hypertension   . Abdominal adhesions   . Ileostomy present ([place])   . Colon polyp   . Chest pain     a. 2002 Cath: nl cors;  b. 2009 aden mv: nl;  c. 05/2009 Echo: nl. d. 2014: normal nuclear stress test, EF 69%.  . Wears dentures   . Breast  lump   . Hyperlipidemia   . Thyroid disease   . Anemia   . History of blood clots   . Hernia   . Blood transfusion   . Hearing loss   . Arthritis   . GERD (gastroesophageal reflux disease)   . Cancer ([place])     bladder  . Colon cancer ([place])     a. recurrence 2016 with liver mets -  percutaneous liver biopsy and thermal ablation of a liver metastasis by interventional radiology..  . Iron deficiency anemia due to chronic blood loss 07/06/2013  . UTI (urinary tract infection)   . Obstruction of intestine or colon ([place]) 06/2014  . Diabetes mellitus without complication ([place]) 123456    borderline type II; takes no medication for it  . Vision abnormalities 2016    not going blind but having retina problems; might lose ability to see faces  . LBBB (left bundle branch block)   . Asthma     hx of  . Depression   . CKD (chronic kidney disease), stage III     pt. states decreased kidney function  . Headache   . Fibromyalgia   . Achalasia     a. s/p botox injection for achalasia.  . Sinus bradycardia     a. HR 30s in 08/2014 requiring holding of digoxin.  . Family history of colon cancer   .  Family history of bladder cancer   . Family history of breast cancer    Past Surgical History  Procedure Laterality Date  . Cardiac catheterization  12/10/2000    THE LEFT VENTRICLE IS MILDY DILATED. THERE IS MILD TO MODERATE DIFFUSE HYPOKINESIS WITH EF 35%  . Colon cancer surgery    . Ileostomy    . Esophagogastroduodenoscopy  02/13/2011    Procedure: ESOPHAGOGASTRODUODENOSCOPY (EGD);  Surgeon: Rogene Houston, MD;  Location: [facility];  Service: Endoscopy;  Laterality: N/A;  1030  . Colonoscopy  09/26/2011    Procedure: COLONOSCOPY;  Surgeon: Rogene Houston, MD;  Location: [facility];  Service: Endoscopy;  Laterality: N/A;  215  . Cataract extraction w/phaco  11/13/2011    Procedure: CATARACT EXTRACTION PHACO AND INTRAOCULAR LENS PLACEMENT (IOC);  Surgeon: Tonny Branch, MD;  Location: [facility];   Service: Ophthalmology;  Laterality: Right;  CDE 12.26  . Cataract extraction w/phaco  12/08/2011    Procedure: CATARACT EXTRACTION PHACO AND INTRAOCULAR LENS PLACEMENT (IOC);  Surgeon: Tonny Branch, MD;  Location: [facility];  Service: Ophthalmology;  Laterality: Left;  CDE 15.11  . Esophagogastroduodenoscopy N/A 09/01/2013    Procedure: ESOPHAGOGASTRODUODENOSCOPY (EGD);  Surgeon: Rogene Houston, MD;  [facility]: [facility];  Service: Endoscopy;  Laterality: N/A;  830  . Botox injection N/A 09/01/2013    Procedure: BOTOX INJECTION;  Surgeon: Rogene Houston, MD;  Location: [facility];  Service: Endoscopy;  Laterality: N/A;  . Abdominal hysterectomy    . Cholecystectomy    . Joint replacement Left 2008  . Tonsillectomy    . Colon surgery    . Eye surgery  2014    catarac surgery  . Appendectomy    . Ovarian cystectomy 1955    . Ileostomy    . Coronary angioplasty    . Rotator cuff repair    . Breast surgery      right breast biopsy  . Liver biopsy and ablation  09/01/14   Family History  Problem Relation Age of Onset  . Stroke    . Hypertension    . Heart disease Mother   . Stroke Mother     deceased  . Arthritis Mother   . Bladder Cancer Mother     dx in her 7s  . Colon cancer Mother   . Arthritis Father   . Heart disease Father     decesaed  . Leukemia Father   . Stroke Sister     alive/debilitated  . Hypertension Sister   . Other Sister     paralysis  . Heart disease Brother     bypass surgery  . Arthritis Brother   . Colon cancer Brother   . Bladder Cancer Brother   . Stroke Sister     alive/debilitated  . Diabetes Sister   . Other Brother     stomach problems  . Other Brother     bladder  . Colon cancer Paternal Aunt   . Bladder Cancer Other     dx twice in her 30s-40s  . Breast cancer Other     great niece dx in her early 58s  . Prostate cancer Other     newphew  . Colon polyps Daughter    Social History  Substance Use Topics  . Smoking status:  Former Smoker -- 0.25 packs/day for 13 years    Types: Cigarettes    Quit date: 02/11/1963  . Smokeless tobacco: Never Used  . Alcohol Use:  No   OB History    No data available     Review of Systems  Respiratory: Negative for cough.   Cardiovascular: Negative for chest pain.  Gastrointestinal: Positive for nausea and diarrhea. Negative for vomiting.  Genitourinary: Negative for dysuria, frequency and hematuria.  Neurological: Positive for weakness (generalized) and light-headedness.  All other systems reviewed and are negative.     Allergies  Bactrim; Codeine; Demerol; Lidocaine hcl; Morphine and related; Dilaudid; Nitrofurantoin monohyd macro; Lisinopril; and Ramipril  Home Medications   Prior to Admission medications   Medication Sig Start Date End Date Taking? Authorizing Provider  ALPRAZolam Duanne Moron) 0.5 MG tablet Take 0.5 mg by mouth at bedtime.     Historical Provider, MD  amitriptyline (ELAVIL) 10 MG tablet Take 20 mg by mouth at bedtime.     Historical Provider, MD  aspirin EC 325 MG tablet Take 325 mg by mouth daily.    Historical Provider, MD  bacitracin-polymyxin b, ophth, (POLYSPORIN) OINT Place 1 application into both eyes every 12 (twelve) hours. 06/01/15   Patrici Ranks, MD  Calcium Carbonate (CALTRATE 600 PO) Take by mouth daily.    Historical Provider, MD  carvedilol (COREG) 12.5 MG tablet Take 1 tablet (12.5 mg total) by mouth 2 (two) times daily with a meal. 12/06/14   Dayna N Dunn, PA-C  erythromycin ophthalmic ointment Place 1 application into the left eye 3 (three) times daily. 06/01/15   Patrici Ranks, MD  fentaNYL (DURAGESIC - DOSED MCG/HR) 25 MCG/HR patch Place 1 patch (25 mcg total) onto the skin every 3 (three) days. 05/28/15   Patrici Ranks, MD  HYDROcodone-acetaminophen (NORCO) 7.5-325 MG tablet Take 1 tablet by mouth every 4 (four) hours as needed. 05/18/15   Patrici Ranks, MD  hydrocortisone (CORTENEMA) 100 MG/60ML enema Place 1 enema (100  mg total) rectally at bedtime. 01/30/15   Rogene Houston, MD  hyoscyamine (LEVSIN SL) 0.125 MG SL tablet Place 1 tablet (0.125 mg total) under the tongue every 6 (six) hours as needed (lower abdominal pain). 09/11/14   Rogene Houston, MD  ketoconazole (NIZORAL) 2 % cream Apply 1 application topically daily. To fungal area on toe    Historical Provider, MD  levothyroxine (SYNTHROID, LEVOTHROID) 75 MCG tablet Take 75 mcg by mouth daily.     Historical Provider, MD  losartan-hydrochlorothiazide (HYZAAR) 100-12.5 MG per tablet Take by mouth as directed. 1/2 TABLET DAILY. DO NOT TAKE IF YOUR TOP BLOOD PRESSURE NUMBER IS LESS THAN 110    Historical Provider, MD  magnesium oxide (MAG-OX) 400 (241.3 Mg) MG tablet Take 2 tablets (800 mg total) by mouth daily. 05/04/15   Patrici Ranks, MD  Multiple Vitamins-Minerals (EQL GUMMY ADULT) CHEW Chew 2 tablets by mouth daily. Reported on 02/06/2015    Historical Provider, MD  NITROSTAT 0.4 MG SL tablet DISSOLVE ONE TABLET UNDER THE TONGUE EVERY 5 MINUTES AS NEEDED FOR CHEST PAIN.  DO NOT EXCEED A TOTAL OF 3 DOSES IN 15 MINUTES 11/15/13   Darlin Coco, MD  nystatin (MYCOSTATIN/NYSTOP) 100000 UNIT/GM POWD Apply 1 Bottle topically 2 (two) times daily. 03/07/15   Patrici Ranks, MD  nystatin-triamcinolone (MYCOLOG II) cream Apply 1 application topically 2 (two) times daily. 03/20/15   Rogene Houston, MD  Omega-3 Fatty Acids (FISH OIL) 1000 MG CAPS Take 1 capsule by mouth daily. Reported on 02/19/2015    Historical Provider, MD  ondansetron (ZOFRAN ODT) 8 MG disintegrating tablet Take 1 tablet (8 mg  total) by mouth every 8 (eight) hours as needed for nausea or vomiting. 05/22/15   Baird Cancer, PA-C  Panitumumab (VECTIBIX IV) Inject into the vein. To start 04/20/15. To be given every 2 weeks.    Historical Provider, MD  pantoprazole (PROTONIX) 40 MG tablet Take 40 mg by mouth daily.     Historical Provider, MD  prochlorperazine (COMPAZINE) 10 MG tablet Take 1 tablet  (10 mg total) by mouth every 6 (six) hours as needed for nausea or vomiting. 05/22/15   Baird Cancer, PA-C  triamcinolone cream (KENALOG) 0.1 %  03/23/15   Historical Provider, MD   BP 103/56 mmHg  Pulse 77  Temp(Src) 98.1 F (36.7 C) (Oral)  Resp 17  Ht 5' (1.524 m)  Wt 145 lb (65.772 kg)  BMI 28.32 kg/m2  SpO2 97% Physical Exam  Constitutional: She appears well-developed and well-nourished. No distress.  HENT:  Head: Normocephalic and atraumatic.  Mouth/Throat: Oropharynx is clear and moist. No oropharyngeal exudate.  Mucous membranes are slightly dry.  Eyes: Conjunctivae and EOM are normal. Pupils are equal, round, and reactive to light. Right eye exhibits no discharge. Left eye exhibits no discharge. No scleral icterus.  Right-sided disconjugate gaze, which patient states is baseline.  Neck: Normal range of motion. Neck supple. No JVD present. No thyromegaly present.  Cardiovascular: Normal rate, regular rhythm, normal heart sounds and intact distal pulses.  Exam reveals no gallop and no friction rub.   No murmur heard. Right radial stronger than left radial.   Pulmonary/Chest: Effort normal and breath sounds normal. No respiratory distress. She has no wheezes. She has no rales.  Abdominal: Soft. She exhibits no distension and no mass. Bowel sounds are increased. There is tenderness.  Diffuse TTP, but soft. Tight and tympaninic to percussion, with increased bowel sounds. Dark, liquid stool in the ostomy.   Musculoskeletal: Normal range of motion. She exhibits no edema or tenderness.  Lymphadenopathy:    She has no cervical adenopathy.  Neurological: She is alert. Coordination normal.  Skin: Skin is warm and dry. No rash noted. No erythema.  Psychiatric: She has a normal mood and affect. Her behavior is normal.  Nursing note and vitals reviewed.   ED Course  Procedures (including critical care time)  DIAGNOSTIC STUDIES: Oxygen Saturation is 100% on RA, normal by my  interpretation.    COORDINATION OF CARE: 1:12 PM - Discussed treatment plan with pt at bedside which includes tests and re-evaluation of blood pressure. If no improvement, will admit to hospital. Pt verbalized understanding and agreed to plan.   Labs Review Labs Reviewed  COMPREHENSIVE METABOLIC PANEL - Abnormal; Notable for the following:    CO2 21 (*)    Glucose, Bld 115 (*)    BUN 41 (*)    Creatinine, Ser 2.40 (*)    ALT 12 (*)    GFR calc non Af Amer 18 (*)    GFR calc Af Amer 20 (*)    All other components within normal limits  CBC WITH DIFFERENTIAL/PLATELET - Abnormal; Notable for the following:    RBC 3.71 (*)    Hemoglobin 11.8 (*)    HCT 35.3 (*)    All other components within normal limits  URINALYSIS, ROUTINE W REFLEX MICROSCOPIC (NOT AT [facility]) - Abnormal; Notable for the following:    APPearance HAZY (*)    Specific Gravity, Urine <1.005 (*)    Leukocytes, UA LARGE (*)    All other components within normal limits  TROPONIN I - Abnormal; Notable for the following:    Troponin I 0.05 (*)    All other components within normal limits  URINE MICROSCOPIC-ADD ON - Abnormal; Notable for the following:    Squamous Epithelial / LPF 0-5 (*)    Bacteria, UA MANY (*)    All other components within normal limits  C DIFFICILE QUICK SCREEN W PCR REFLEX  CULTURE, BLOOD (ROUTINE X 2)  CULTURE, BLOOD (ROUTINE X 2)  URINE CULTURE  I-STAT CG4 LACTIC ACID, ED  I-STAT CG4 LACTIC ACID, ED    Imaging Review Dg Chest Port 1 View  06/12/2015  CLINICAL DATA:  chills last week and low BP today. Pt c/o feeling fatigued and lightheaded. Pt presently has stage IV colon and liver cancer and is on chemo/htn/ex smoker EXAM: PORTABLE CHEST 1 VIEW COMPARISON:  09/01/2014 FINDINGS: The heart size and mediastinal contours are within normal limits. Both lungs are clear. The visualized skeletal structures are unremarkable. IMPRESSION: No active disease. Electronically Signed   By: Nolon Nations  M.D.   On: 06/12/2015 13:34   I have personally reviewed and evaluated these images and lab results as part of my medical decision-making.   EKG Interpretation   Date/Time:  Tuesday Jun 12 2015 13:28:50 EDT Ventricular Rate:  74 PR Interval:  201 QRS Duration: 131 QT Interval:  418 QTC Calculation: 464 R Axis:   18 Text Interpretation:  Sinus rhythm Left bundle branch block Since last  tracing rate faster Abnormal ekg Confirmed by Oaklan Persons  MD, Denee Boeder ([id number]) on  06/12/2015 1:51:42 PM      MDM   Final diagnoses:  Hypotension, unspecified hypotension type  UTI (lower urinary tract infection)  AKI (acute kidney injury) ([place])   The patient has had persistent hypotension, on arrival the patient's blood pressure was extremely low at 80 systolic, she has had multiple IV fluid boluses to reach 30 mL/kg. I do not see that the patient has a significant leukocytosis or lactic acidosis, does not necessarily sepsis as she does not meet all the criteria however she does have persistent hypotension and a urinary tract infection with acute kidney injury. Critical care provided with multiple IV fluid boluses, sepsis dose antibiotics, she will need to be admitted to a high level of care in the hospital to monitor for ongoing hypotension.  CT cancelled as the source of the pt 's sx is less likely to be surgical given the lab fidnings.  ,CRITICAL CARE Performed by: Johnna Acosta Total critical care time: 35 minutes Critical care time was exclusive of separately billable procedures and treating other patients. Critical care was necessary to treat or prevent imminent or life-threatening deterioration. Critical care was time spent personally by me on the following activities: development of treatment plan with patient and/or surrogate as well as nursing, discussions with consultants, evaluation of patient's response to treatment, examination of patient, obtaining history from patient or surrogate, ordering  and performing treatments and interventions, ordering and review of laboratory studies, ordering and review of radiographic studies, pulse oximetry and re-evaluation of patient's condition.  Meds given in ED:  Medications  ondansetron (ZOFRAN) injection 4 mg (4 mg Intravenous Not Given 06/12/15 1342)  diatrizoate meglumine-sodium (GASTROGRAFIN) 66-10 % solution (not administered)  diatrizoate meglumine-sodium (GASTROGRAFIN) 66-10 % solution (not administered)  cefTRIAXone (ROCEPHIN) 2 g in dextrose 5 % 50 mL IVPB (2 g Intravenous New Bag/Given 06/12/15 1656)  sodium chloride 0.9 % bolus 1,000 mL (0 mLs Intravenous Stopped 06/12/15 1411)  And  sodium chloride 0.9 % bolus 1,000 mL (0 mLs Intravenous Stopped 06/12/15 1555)      Karen Chapel, MD 06/14/15 [phone]

## 2015-06-12 NOTE — Progress Notes (Signed)
Patient complaining of chest pain, paging on call MD to make them aware, will follow any orders given and continue to monitor.

## 2015-06-12 NOTE — ED Notes (Signed)
Spoke with Cherlyn Roberts and she reports sandwich tray is ready and she will bring it to pt admitting room # 307 as pt is being transported to room in few minutes.

## 2015-06-12 NOTE — ED Notes (Signed)
Daughter reports pt had chills last week and low bp today.  Pt c/o feeling fatigued and lightheaded.  Pt presently has stage IV colon and liver cancer and is on chemo.  Pt also being evaluated for c diff because has been having diarrhea.  Last chemo treatment was April 21st.

## 2015-06-12 NOTE — H&P (Addendum)
History and Physical    Karen [name] [id number] DOB: 11-Nov-1929 DOA: 06/12/2015  Referring MD/NP/PA: Dr. Sabra Heck PCP: [name], MD  Outpatient Specialists:Dr. Whitney Muse, Oncology Patient coming from: Home  Chief Complaint:  low blood pressure  HPI: Karen [name] is a 82 y.o. female with medical history significant of colon cancer stage IV with metastases to liver on chemotherapy,s/p illeostomy, diastolic CHF, iron deficiency anemia, PSVT, HTN, hypothyroidism; who presents with complaints of low blood pressure and increased output from her ileostomy. Her ileostomy has been filling up more frequently since her last round of which was approximately 2 weeks ago. She tried using Imodium without relief of symptoms. As she's had previous bowel obstructions in the past she was worried slowing down her bowels too much. She was evaluated for the possibility of C. Difficile, but that returned negative yesterday. Notes when she woke up this morning she saw that her blood pressure was as low as 84/46. Associated symptoms include lower abdominal pain, lightheadedness, getting generalized weakness, and nausea. She notes that she is been by her husband's side as he is undergoing surgery down at [facility].  Since that time she's had this petechial rash come up on her mouth and around her thighs that she relates to the chemotherapy as well.  Denies having any dysuria, frequency, cough, chest pain, shortness of breath, or vomiting.   ED Course:  On admission to the emergency department patient was noted have a blood pressure as low as 92/44 all other vitals within normal limits. Lab work revealed WBC 5.8, hemoglobin 11.8, platelets 176, sodium 135, potassium 4.7, chloride 105, CO2 21, BUN 41, creatinine 2.4, calcium 9, troponin 0.05, glucose 115. Urinalysis positive for leukocytes. Patient was given 1 dose of Rocephin in the emergency department along with 2 L of normal saline IV fluids.  Review of  Systems: As per HPI otherwise 10 point review of systems negative.    Past Medical History  Diagnosis Date  . Chronic diastolic CHF (congestive heart failure) (HCC)     a. nl EF by echo 05/2009.  Marland Kitchen DVT of deep femoral vein ([place])   . History of PSVT (paroxysmal supraventricular tachycardia)   . Hypertension   . Abdominal adhesions   . Ileostomy present ([place])   . Colon polyp   . Chest pain     a. 2002 Cath: nl cors;  b. 2009 aden mv: nl;  c. 05/2009 Echo: nl. d. 2014: normal nuclear stress test, EF 69%.  . Wears dentures   . Breast lump   . Hyperlipidemia   . Thyroid disease   . Anemia   . History of blood clots   . Hernia   . Blood transfusion   . Hearing loss   . Arthritis   . GERD (gastroesophageal reflux disease)   . Cancer ([place])     bladder  . Colon cancer ([place])     a. recurrence 2016 with liver mets -  percutaneous liver biopsy and thermal ablation of a liver metastasis by interventional radiology..  . Iron deficiency anemia due to chronic blood loss 07/06/2013  . UTI (urinary tract infection)   . Obstruction of intestine or colon ([place]) 06/2014  . Diabetes mellitus without complication ([place]) [id number]    borderline type II; takes no medication for it  . Vision abnormalities 2016    not going blind but having retina problems; might lose ability to see faces  . LBBB (left bundle branch block)   . Asthma  hx of  . Depression   . CKD (chronic kidney disease), stage III     pt. states decreased kidney function  . Headache   . Fibromyalgia   . Achalasia     a. s/p botox injection for achalasia.  . Sinus bradycardia     a. HR 30s in 08/2014 requiring holding of digoxin.  . Family history of colon cancer   . Family history of bladder cancer   . Family history of breast cancer     Past Surgical History  Procedure Laterality Date  . Cardiac catheterization  12/10/2000    THE LEFT VENTRICLE IS MILDY DILATED. THERE IS MILD TO MODERATE DIFFUSE HYPOKINESIS WITH EF 35%  .  Colon cancer surgery    . Ileostomy    . Esophagogastroduodenoscopy  02/13/2011    Procedure: ESOPHAGOGASTRODUODENOSCOPY (EGD);  Surgeon: Rogene Houston, MD;  Location[facility];  Service: Endoscopy;  Laterality: N/A;  1030  . Colonoscopy  09/26/2011    Procedure: COLONOSCOPY;  Surgeon: Rogene Houston, MD;  Location: [facility];  Service: Endoscopy;  Laterality: N/A;  215  . Cataract extraction w/phaco  11/13/2011    Procedure: CATARACT EXTRACTION PHACO AND INTRAOCULAR LENS PLACEMENT (IOC);  Surgeon: Tonny Branch, MD;  Location: [facility] ORS;  Service: Ophthalmology;  Laterality: Right;  CDE 12.26  . Cataract extraction w/phaco  12/08/2011    Procedure: CATARACT EXTRACTION PHACO AND INTRAOCULAR LENS PLACEMENT (IOC);  Surgeon: Tonny Branch, MD;  Location: [facility];  Service: Ophthalmology;  Laterality: Left;  CDE 15.11  . Esophagogastroduodenoscopy N/A 09/01/2013    Procedure: ESOPHAGOGASTRODUODENOSCOPY (EGD);  Surgeon: Rogene Houston, MD;  [facility];  Service: Endoscopy;  Laterality: N/A;  830  . Botox injection N/A 09/01/2013    Procedure: BOTOX INJECTION;  Surgeon: Rogene Houston, MD;  Location[facility];  Service: Endoscopy;  Laterality: N/A;  . Abdominal hysterectomy    . Cholecystectomy    . Joint replacement Left 2008  . Tonsillectomy    . Colon surgery    . Eye surgery  2014    catarac surgery  . Appendectomy    . Ovarian cystectomy 1955    . Ileostomy    . Coronary angioplasty    . Rotator cuff repair    . Breast surgery      right breast biopsy  . Liver biopsy and ablation  09/01/14     reports that she quit smoking about 52 years ago. Her smoking use included Cigarettes. She has a 3.25 pack-year smoking history. She has never used smokeless tobacco. She reports that she does not drink alcohol or use illicit drugs.  Allergies  Allergen Reactions  . Bactrim [Sulfamethoxazole-Trimethoprim] Shortness Of Breath and Other (See Comments)    Hurting all over  .  Codeine Palpitations and Other (See Comments)    Heart races  . Demerol Palpitations and Other (See Comments)    tachycardia  . Lidocaine Hcl Other (See Comments)    tachycardia  . Morphine And Related Palpitations    Tachycardia   . Dilaudid [Hydromorphone Hcl] Other (See Comments)    Hallucinations  . Nitrofurantoin Monohyd Macro Other (See Comments)    Unknown   . Lisinopril Cough  . Ramipril Cough    Family History  Problem Relation Age of Onset  . Stroke    . Hypertension    . Heart disease Mother   . Stroke Mother     deceased  . Arthritis Mother   .  Bladder Cancer Mother     dx in her 21s  . Colon cancer Mother   . Arthritis Father   . Heart disease Father     decesaed  . Leukemia Father   . Stroke Sister     alive/debilitated  . Hypertension Sister   . Other Sister     paralysis  . Heart disease Brother     bypass surgery  . Arthritis Brother   . Colon cancer Brother   . Bladder Cancer Brother   . Stroke Sister     alive/debilitated  . Diabetes Sister   . Other Brother     stomach problems  . Other Brother     bladder  . Colon cancer Paternal Aunt   . Bladder Cancer Other     dx twice in her 30s-40s  . Breast cancer Other     great niece dx in her early 53s  . Prostate cancer Other     newphew  . Colon polyps Daughter     Prior to Admission medications   Medication Sig Start Date End Date Taking? Authorizing Provider  ALPRAZolam Duanne Moron) 0.5 MG tablet Take 0.5 mg by mouth at bedtime.    Yes Historical Provider, MD  aspirin EC 325 MG tablet Take 325 mg by mouth daily.   Yes Historical Provider, MD  Calcium Carbonate (CALTRATE 600 PO) Take 1 tablet by mouth daily.    Yes Historical Provider, MD  carvedilol (COREG) 12.5 MG tablet Take 1 tablet (12.5 mg total) by mouth 2 (two) times daily with a meal. 12/06/14  Yes Dayna N Dunn, PA-C  erythromycin ophthalmic ointment Place 1 application into the left eye 3 (three) times daily. 06/01/15  Yes Patrici Ranks, MD  fentaNYL (DURAGESIC - DOSED MCG/HR) 25 MCG/HR patch Place 1 patch (25 mcg total) onto the skin every 3 (three) days. 05/28/15  Yes Patrici Ranks, MD  HYDROcodone-acetaminophen (NORCO) 7.5-325 MG tablet Take 1 tablet by mouth every 4 (four) hours as needed. Patient taking differently: Take 1 tablet by mouth every 4 (four) hours as needed for moderate pain or severe pain.  05/18/15  Yes Patrici Ranks, MD  hyoscyamine (LEVSIN SL) 0.125 MG SL tablet Place 1 tablet (0.125 mg total) under the tongue every 6 (six) hours as needed (lower abdominal pain). 09/11/14  Yes Rogene Houston, MD  ketoconazole (NIZORAL) 2 % cream Apply 1 application topically daily. To fungal area on toe   Yes Historical Provider, MD  levothyroxine (SYNTHROID, LEVOTHROID) 75 MCG tablet Take 75 mcg by mouth daily.    Yes Historical Provider, MD  losartan-hydrochlorothiazide (HYZAAR) 100-12.5 MG per tablet Take 0.5-1 tablets by mouth as directed. 1/2 TABLET DAILY. DO NOT TAKE IF YOUR TOP BLOOD PRESSURE NUMBER IS LESS THAN 110   Yes Historical Provider, MD  Multiple Vitamins-Minerals (EQL GUMMY ADULT) CHEW Chew 2 tablets by mouth daily. Reported on 02/06/2015   Yes Historical Provider, MD  NITROSTAT 0.4 MG SL tablet DISSOLVE ONE TABLET UNDER THE TONGUE EVERY 5 MINUTES AS NEEDED FOR CHEST PAIN.  DO NOT EXCEED A TOTAL OF 3 DOSES IN 15 MINUTES 11/15/13  Yes Darlin Coco, MD  nystatin (MYCOSTATIN/NYSTOP) 100000 UNIT/GM POWD Apply 1 Bottle topically 2 (two) times daily. 03/07/15  Yes Patrici Ranks, MD  nystatin-triamcinolone (MYCOLOG II) cream Apply 1 application topically 2 (two) times daily. 03/20/15  Yes Rogene Houston, MD  Omega-3 Fatty Acids (FISH OIL) 1000 MG CAPS Take 1 capsule by mouth  daily. Reported on 02/19/2015   Yes Historical Provider, MD  ondansetron (ZOFRAN ODT) 8 MG disintegrating tablet Take 1 tablet (8 mg total) by mouth every 8 (eight) hours as needed for nausea or vomiting. 05/22/15  Yes Baird Cancer, PA-C  Panitumumab (VECTIBIX IV) Inject into the vein. To start 04/20/15. To be given every 2 weeks.   Yes Historical Provider, MD  pantoprazole (PROTONIX) 40 MG tablet Take 40 mg by mouth daily.    Yes Historical Provider, MD  prochlorperazine (COMPAZINE) 10 MG tablet Take 1 tablet (10 mg total) by mouth every 6 (six) hours as needed for nausea or vomiting. 05/22/15  Yes Baird Cancer, PA-C  diphenoxylate-atropine (LOMOTIL) 2.5-0.025 MG tablet Take 1-2 tablets four times a day as needed for diarrhea. 06/12/15   Baird Cancer, PA-C  magnesium oxide (MAG-OX) 400 (241.3 Mg) MG tablet Take 2 tablets (800 mg total) by mouth daily. Patient not taking: Reported on 06/12/2015 05/04/15   Patrici Ranks, MD    Physical Exam: Filed Vitals:   06/12/15 1555 06/12/15 1630 06/12/15 1800 06/12/15 1830  BP: 102/78 103/56 100/44 104/60  Pulse: 85 77 89 92  Temp:      TempSrc:      Resp:  17 15 17   Height:      Weight:      SpO2: 100% 97% 97% 99%      Constitutional: NAD, calm, comfortable Filed Vitals:   06/12/15 1555 06/12/15 1630 06/12/15 1800 06/12/15 1830  BP: 102/78 103/56 100/44 104/60  Pulse: 85 77 89 92  Temp:      TempSrc:      Resp:  17 15 17   Height:      Weight:      SpO2: 100% 97% 97% 99%   Eyes: PERRL, lids and conjunctivae normal ENMT: Dry mucous membranes are moist. Posterior pharynx clear of any exudate or lesions.Normal dentition.  Neck: normal, supple, no masses, no thyromegaly Respiratory: clear to auscultation bilaterally, no wheezing, no crackles. Normal respiratory effort. No accessory muscle use.  Cardiovascular: Regular rate and rhythm, no murmurs / rubs / gallops. No extremity edema. 2+ pedal pulses. No carotid bruits.  Abdomen: Generalized tenderness to palpation. Increased bowel sounds. Musculoskeletal: no clubbing / cyanosis. No joint deformity upper and lower extremities. Good ROM, no contractures. Normal muscle tone.  Skin: no rashes, lesions,  ulcers. No induration Neurologic: CN 2-12 grossly intact. Sensation intact, DTR normal. Strength 5/5 in all 4.  Psychiatric: Normal judgment and insight. Alert and oriented x 3. Normal mood.     Labs on Admission: I have personally reviewed following labs and imaging studies  CBC:  Recent Labs Lab 06/12/15 1340  WBC 5.8  NEUTROABS 3.9  HGB 11.8*  HCT 35.3*  MCV 95.1  PLT 0000000   Basic Metabolic Panel:  Recent Labs Lab 06/12/15 1340  NA 135  K 4.7  CL 105  CO2 21*  GLUCOSE 115*  BUN 41*  CREATININE 2.40*  CALCIUM 9.0   GFR: Estimated Creatinine Clearance: 15 mL/min (by C-G formula based on Cr of 2.4). Liver Function Tests:  Recent Labs Lab 06/12/15 1340  AST 20  ALT 12*  ALKPHOS 67  BILITOT 0.6  PROT 7.3  ALBUMIN 3.7   No results for input(s): LIPASE, AMYLASE in the last 168 hours. No results for input(s): AMMONIA in the last 168 hours. Coagulation Profile: No results for input(s): INR, PROTIME in the last 168 hours. Cardiac Enzymes:  Recent Labs Lab  06/12/15 1340  TROPONINI 0.05*   BNP (last 3 results) No results for input(s): PROBNP in the last 8760 hours. HbA1C: No results for input(s): HGBA1C in the last 72 hours. CBG: No results for input(s): GLUCAP in the last 168 hours. Lipid Profile: No results for input(s): CHOL, HDL, LDLCALC, TRIG, CHOLHDL, LDLDIRECT in the last 72 hours. Thyroid Function Tests: No results for input(s): TSH, T4TOTAL, FREET4, T3FREE, THYROIDAB in the last 72 hours. Anemia Panel: No results for input(s): VITAMINB12, FOLATE, FERRITIN, TIBC, IRON, RETICCTPCT in the last 72 hours. Urine analysis:    Component Value Date/Time   COLORURINE YELLOW 06/12/2015 1441   APPEARANCEUR HAZY* 06/12/2015 1441   LABSPEC <1.005* 06/12/2015 1441   PHURINE 5.0 06/12/2015 1441   GLUCOSEU NEGATIVE 06/12/2015 1441   HGBUR NEGATIVE 06/12/2015 1441   BILIRUBINUR NEGATIVE 06/12/2015 1441   KETONESUR NEGATIVE 06/12/2015 1441   PROTEINUR  NEGATIVE 06/12/2015 1441   UROBILINOGEN 0.2 06/15/2014 0800   NITRITE NEGATIVE 06/12/2015 1441   LEUKOCYTESUR LARGE* 06/12/2015 1441   Sepsis Labs: @LABRCNTIP (procalcitonin:4,lacticidven:4) ) Recent Results (from the past 240 hour(s))  C difficile quick screen w PCR reflex     Status: None   Collection Time: 06/11/15  4:00 PM  Result Value Ref Range Status   C Diff antigen NEGATIVE NEGATIVE Final   C Diff toxin NEGATIVE NEGATIVE Final   C Diff interpretation Negative for toxigenic C. difficile  Final     Radiological Exams on Admission: Dg Chest Port 1 View  06/12/2015  CLINICAL DATA:  chills last week and low BP today. Pt c/o feeling fatigued and lightheaded. Pt presently has stage IV colon and liver cancer and is on chemo/htn/ex smoker EXAM: PORTABLE CHEST 1 VIEW COMPARISON:  09/01/2014 FINDINGS: The heart size and mediastinal contours are within normal limits. Both lungs are clear. The visualized skeletal structures are unremarkable. IMPRESSION: No active disease. Electronically Signed   By: Nolon Nations M.D.   On: 06/12/2015 13:34    EKG: Independently reviewed. Sinus rhythm with left bundle branch block  Assessment/Plan Hypotension secondary to dehydration from diarrhea: Patient given 2 L of IV fluids in the ED with improvement of blood pressures. Negative C. difficile found yesterday. - Admit to telemetry bed - Follow-up blood cultures and GI panel - Normal saline IV fluids 75 mL per hour - imodium - Strict ins and outs  Urinary tract infection: Acute. Patient's symptoms of abdominal pain could likely be related. - Follow-up urine culture  - Rocephin IV dosing per pharmacy  Acute renal failure on chronic kidney disease stage III: Patient with a baseline creatinine of 1.2- 1.3 , but acutely found to have elevated creatinine of 2.4 on admission with a BUN of 41. - Check FeUr - IV fluids as seen above - Follow-up repeat BMP  Elevated troponin: Likely secondary to acute  dehydration - Trend troponins  Colon cancer stage IV with metastases - Notify Dr. Whitney Muse that the patient is hospitalized - Continue fentanyl and hydrocodone prn  Essential hypertension - Held Hyzaar due to acute kidney injury - Continue current  Iron deficiency anemia: Hemoglobin noted 11.8 on admission. - Follow-up repeat CBC in a.m. - Check stool guaiac  Anxiety - Xanax prn  Hypothyroidism - Continue Levothyroxine   Jerrye Bushy  - continue protonix  DVT prophylaxis: Lovenox Code Status: Full Family Communication: Discussed plan with daughter Disposition Plan: Discharge home in 1-2 days Consults called: None Admission status: Telemetry observation  Norval Morton MD [facility] Pager [phone]  If 7PM-7AM, please contact night-coverage [email or web address] Password [facility]  06/12/2015, 7:54 PM

## 2015-06-12 NOTE — ED Notes (Signed)
EDP in to evaluate 

## 2015-06-12 NOTE — Progress Notes (Signed)
Pharmacy Antibiotic Note  Karen [name] is a 82 y.o. female admitted on 06/12/2015 with UTI.  Pharmacy has been consulted for ceftriaxone dosing.  Plan: Ceftriaxone 1gm IV q24h F/U cultures and plan  Height: 5' (152.4 cm) Weight: 145 lb (65.772 kg) IBW/kg (Calculated) : 45.5  Temp (24hrs), Avg:98.1 F (36.7 C), Min:98.1 F (36.7 C), Max:98.1 F (36.7 C)   Recent Labs Lab 06/12/15 1340 06/12/15 1354 06/12/15 1624  WBC 5.8  --   --   CREATININE 2.40*  --   --   LATICACIDVEN  --  0.76 0.84    Estimated Creatinine Clearance: 15 mL/min (by C-G formula based on Cr of 2.4).    Allergies  Allergen Reactions  . Bactrim [Sulfamethoxazole-Trimethoprim] Shortness Of Breath and Other (See Comments)    Hurting all over  . Codeine Palpitations and Other (See Comments)    Heart races  . Demerol Palpitations and Other (See Comments)    tachycardia  . Lidocaine Hcl Other (See Comments)    tachycardia  . Morphine And Related Palpitations    Tachycardia   . Dilaudid [Hydromorphone Hcl] Other (See Comments)    Hallucinations  . Nitrofurantoin Monohyd Macro Other (See Comments)    Unknown   . Lisinopril Cough  . Ramipril Cough    Antimicrobials this admission: Ceftriaxone 5/2 >>   Microbiology results: 5/2 BCx: pending 5/2 UCx: peding  Thank you for allowing pharmacy to be a part of this patient's care.  Isac Sarna, BS [place], [place] Clinical Pharmacist Pager [phone] 06/12/2015 7:53 PM

## 2015-06-13 DIAGNOSIS — N183 Chronic kidney disease, stage 3 unspecified: Secondary | ICD-10-CM

## 2015-06-13 DIAGNOSIS — N179 Acute kidney failure, unspecified: Secondary | ICD-10-CM | POA: Diagnosis not present

## 2015-06-13 DIAGNOSIS — I9589 Other hypotension: Secondary | ICD-10-CM | POA: Diagnosis not present

## 2015-06-13 DIAGNOSIS — I959 Hypotension, unspecified: Secondary | ICD-10-CM | POA: Diagnosis not present

## 2015-06-13 DIAGNOSIS — R197 Diarrhea, unspecified: Secondary | ICD-10-CM

## 2015-06-13 DIAGNOSIS — C787 Secondary malignant neoplasm of liver and intrahepatic bile duct: Secondary | ICD-10-CM

## 2015-06-13 DIAGNOSIS — C189 Malignant neoplasm of colon, unspecified: Secondary | ICD-10-CM | POA: Diagnosis not present

## 2015-06-13 LAB — CBC
HCT: 31.4 % — ABNORMAL LOW (ref 36.0–46.0)
HEMOGLOBIN: 10.5 g/dL — AB (ref 12.0–15.0)
MCH: 31.7 pg (ref 26.0–34.0)
MCHC: 33.4 g/dL (ref 30.0–36.0)
MCV: 94.9 fL (ref 78.0–100.0)
PLATELETS: 162 10*3/uL (ref 150–400)
RBC: 3.31 MIL/uL — AB (ref 3.87–5.11)
RDW: 12.9 % (ref 11.5–15.5)
WBC: 3.9 10*3/uL — AB (ref 4.0–10.5)

## 2015-06-13 LAB — GASTROINTESTINAL PANEL BY PCR, STOOL (REPLACES STOOL CULTURE)

## 2015-06-13 LAB — BASIC METABOLIC PANEL
Anion gap: 7 (ref 5–15)
BUN: 30 mg/dL — ABNORMAL HIGH (ref 6–20)
CALCIUM: 8.1 mg/dL — AB (ref 8.9–10.3)
CO2: 20 mmol/L — AB (ref 22–32)
Chloride: 115 mmol/L — ABNORMAL HIGH (ref 101–111)
Creatinine, Ser: 1.28 mg/dL — ABNORMAL HIGH (ref 0.44–1.00)
GFR, EST AFRICAN AMERICAN: 44 mL/min — AB (ref >60)
GFR, EST NON AFRICAN AMERICAN: 38 mL/min — AB (ref >60)
Glucose, Bld: 77 mg/dL (ref 65–99)
Potassium: 3.9 mmol/L (ref 3.5–5.1)
SODIUM: 142 mmol/L (ref 135–145)

## 2015-06-13 LAB — OCCULT BLOOD X 1 CARD TO LAB, STOOL: FECAL OCCULT BLD: POSITIVE — AB

## 2015-06-13 LAB — TROPONIN I
TROPONIN I: 0.05 ng/mL — AB (ref <0.031)
TROPONIN I: 0.05 ng/mL — AB (ref <0.031)

## 2015-06-13 MED ORDER — HYDROCORTISONE 1 % EX CREA: TOPICAL_CREAM | Freq: Three times a day (TID) | CUTANEOUS | Status: DC | PRN

## 2015-06-13 MED ORDER — CIPROFLOXACIN HCL 250 MG PO TABS: 250.0000 mg | ORAL_TABLET | Freq: Two times a day (BID) | ORAL | Status: DC

## 2015-06-13 NOTE — Care Management Obs Status (Signed)
[place] NOTIFICATION   Patient Details  Name: Karen [name] MRN: [id number] Date of Birth: 1929-08-26   Medicare Observation Status Notification Given:  No (pt discharged <24 hrs)    Sherald Barge, RN 06/13/2015, 3:05 PM

## 2015-06-13 NOTE — Progress Notes (Signed)
Pt. IV discontinued.Reviewed discharge instructions. Discharged home.

## 2015-06-13 NOTE — Progress Notes (Signed)
Patient has some type of bug bite to her right side that she showed me at 0445. Paging the on call MD to get something for itching, will follow any new orders received and continue to monitor.

## 2015-06-13 NOTE — Discharge Summary (Signed)
Physician Discharge Summary  Karen [name] [id number] DOB: [birth year] DOA: 06/12/2015  PCP: [name], MD  Admit date: 06/12/2015 Discharge date: 06/13/2015  Time spent: 45 minutes  Recommendations for Outpatient Follow-up:  -Will be discharged home today. -Advised follow-up with primary care provider in 2 weeks at which time her renal function should be followed. -Please note that her ARB/diuretic combination drug has been discontinued for now given her relative hypotension and acute renal failure.   Discharge Diagnoses:  Principal Problem:   Hypotension Active Problems:   Hypothyroid   Iron deficiency anemia due to chronic blood loss   Colon cancer metastasized to liver (HCC)   AKI (acute kidney injury) ([place])   Urinary tract infection   Diarrhea   Elevated troponin   CKD (chronic kidney disease) stage 3, GFR 30-59 ml/min   Discharge Condition: Stable and improved  Filed Weights   06/12/15 1246 06/12/15 2046  Weight: 65.772 kg (145 lb) 63.4 kg (139 lb 12.4 oz)    History of present illness:  As per Dr. Tamala Julian on 5/2: LYA ZAPPONE is a 82 y.o. female with medical history significant of colon cancer stage IV with metastases to liver on chemotherapy,s/p illeostomy, diastolic CHF, iron deficiency anemia, PSVT, HTN, hypothyroidism; who presents with complaints of low blood pressure and increased output from her ileostomy. Her ileostomy has been filling up more frequently since her last round of which was approximately 2 weeks ago. She tried using Imodium without relief of symptoms. As she's had previous bowel obstructions in the past she was worried slowing down her bowels too much. She was evaluated for the possibility of C. Difficile, but that returned negative yesterday. Notes when she woke up this morning she saw that her blood pressure was as low as 84/46. Associated symptoms include lower abdominal pain, lightheadedness, getting generalized weakness, and nausea. She  notes that she is been by her husband's side as he is undergoing surgery down at [facility]. Since that time she's had this petechial rash come up on her mouth and around her thighs that she relates to the chemotherapy as well. Denies having any dysuria, frequency, cough, chest pain, shortness of breath, or vomiting.   ED Course: On admission to the emergency department patient was noted have a blood pressure as low as 92/44 all other vitals within normal limits. Lab work revealed WBC 5.8, hemoglobin 11.8, platelets 176, sodium 135, potassium 4.7, chloride 105, CO2 21, BUN 41, creatinine 2.4, calcium 9, troponin 0.05, glucose 115. Urinalysis positive for leukocytes. Patient was given 1 dose of Rocephin in the emergency department along with 2 L of normal saline IV fluids.   Hospital Course:   Hypotension -Secondary to dehydration from diarrhea. -Completely resolved with IV fluids. -Her ARB/diuretic drug combination has been discontinued for now.  Diarrhea -Resolved, presumed secondary to chemotherapy.  UTI -Culture data is pending at time of discharge. -Cipro 250 mg twice a day for 5 days has been prescribed.  Acute on chronic kidney disease stage III -Baseline creatinine around 1.3. -Creatinine on admission was 2.4 and has decreased her baseline of 1.28 at time of discharge. -Acute component presumed secondary to dehydration from diarrhea. -As above, her ARB/diuretic drug combination has been discontinued for now pending resolution of her relative hypotension.  Metastatic stage IV colon cancer -Continue outpatient follow-up with Dr. Whitney Muse, currently receiving chemotherapy.   Procedures:  None   Consultations:  None  Discharge Instructions  Discharge Instructions    Diet -  low sodium heart healthy    Complete by:  As directed      Increase activity slowly    Complete by:  As directed             Medication List    STOP taking these medications         erythromycin ophthalmic ointment     ketoconazole 2 % cream  Commonly known as:  NIZORAL     losartan-hydrochlorothiazide 100-12.5 MG tablet  Commonly known as:  HYZAAR     magnesium oxide 400 (241.3 Mg) MG tablet  Commonly known as:  MAG-OX      TAKE these medications        ALPRAZolam 0.5 MG tablet  Commonly known as:  XANAX  Take 0.5 mg by mouth at bedtime.     aspirin EC 325 MG tablet  Take 325 mg by mouth daily.     CALTRATE 600 PO  Take 1 tablet by mouth daily.     carvedilol 12.5 MG tablet  Commonly known as:  COREG  Take 1 tablet (12.5 mg total) by mouth 2 (two) times daily with a meal.     ciprofloxacin 250 MG tablet  Commonly known as:  CIPRO  Take 1 tablet (250 mg total) by mouth 2 (two) times daily.     EQL GUMMY ADULT Chew  Chew 2 tablets by mouth daily. Reported on 02/06/2015     fentaNYL 25 MCG/HR patch  Commonly known as:  DURAGESIC - dosed mcg/hr  Place 1 patch (25 mcg total) onto the skin every 3 (three) days.     Fish Oil 1000 MG Caps  Take 1 capsule by mouth daily. Reported on 02/19/2015     HYDROcodone-acetaminophen 7.5-325 MG tablet  Commonly known as:  NORCO  Take 1 tablet by mouth every 4 (four) hours as needed.     hyoscyamine 0.125 MG SL tablet  Commonly known as:  LEVSIN SL  Place 1 tablet (0.125 mg total) under the tongue every 6 (six) hours as needed (lower abdominal pain).     levothyroxine 75 MCG tablet  Commonly known as:  SYNTHROID, LEVOTHROID  Take 75 mcg by mouth daily.     NITROSTAT 0.4 MG SL tablet  Generic drug:  nitroGLYCERIN  DISSOLVE ONE TABLET UNDER THE TONGUE EVERY 5 MINUTES AS NEEDED FOR CHEST PAIN.  DO NOT EXCEED A TOTAL OF 3 DOSES IN 15 MINUTES     nystatin powder  Generic drug:  nystatin  Apply 1 Bottle topically 2 (two) times daily.     nystatin-triamcinolone cream  Commonly known as:  MYCOLOG II  Apply 1 application topically 2 (two) times daily.     ondansetron 8 MG disintegrating tablet  Commonly known  as:  ZOFRAN ODT  Take 1 tablet (8 mg total) by mouth every 8 (eight) hours as needed for nausea or vomiting.     pantoprazole 40 MG tablet  Commonly known as:  PROTONIX  Take 40 mg by mouth daily.     prochlorperazine 10 MG tablet  Commonly known as:  COMPAZINE  Take 1 tablet (10 mg total) by mouth every 6 (six) hours as needed for nausea or vomiting.     VECTIBIX IV  Inject into the vein. To start 04/20/15. To be given every 2 weeks.       Allergies  Allergen Reactions  . Bactrim [Sulfamethoxazole-Trimethoprim] Shortness Of Breath and Other (See Comments)    Hurting all over  . Codeine Palpitations  and Other (See Comments)    Heart races  . Demerol Palpitations and Other (See Comments)    tachycardia  . Lidocaine Hcl Other (See Comments)    tachycardia  . Morphine And Related Palpitations    Tachycardia   . Dilaudid [Hydromorphone Hcl] Other (See Comments)    Hallucinations  . Nitrofurantoin Monohyd Macro Other (See Comments)    Unknown   . Lisinopril Cough  . Ramipril Cough       Follow-up Information    Follow up with [name], MD. Schedule an appointment as soon as possible for a visit in 2 weeks.   Specialty:  Family Medicine   Contact information:   [place] [address] [phone]        The results of significant diagnostics from this hospitalization (including imaging, microbiology, ancillary and laboratory) are listed below for reference.    Significant Diagnostic Studies: Dg Chest Port 1 View  06/12/2015  CLINICAL DATA:  chills last week and low BP today. Pt c/o feeling fatigued and lightheaded. Pt presently has stage IV colon and liver cancer and is on chemo/htn/ex smoker EXAM: PORTABLE CHEST 1 VIEW COMPARISON:  09/01/2014 FINDINGS: The heart size and mediastinal contours are within normal limits. Both lungs are clear. The visualized skeletal structures are unremarkable. IMPRESSION: No active disease. Electronically Signed   By:  Nolon Nations M.D.   On: 06/12/2015 13:34    Microbiology: Recent Results (from the past 240 hour(s))  C difficile quick screen w PCR reflex     Status: None   Collection Time: 06/11/15  4:00 PM  Result Value Ref Range Status   C Diff antigen NEGATIVE NEGATIVE Final   C Diff toxin NEGATIVE NEGATIVE Final   C Diff interpretation Negative for toxigenic C. difficile  Final  Blood Culture (routine x 2)     Status: None (Preliminary result)   Collection Time: 06/12/15  1:50 PM  Result Value Ref Range Status   Specimen Description BLOOD RIGHT ARM DRAWN BY RN  Final   Special Requests BOTTLES DRAWN AEROBIC AND ANAEROBIC 6CC  Final   Culture NO GROWTH < 24 HOURS  Final   Report Status PENDING  Incomplete  Blood Culture (routine x 2)     Status: None (Preliminary result)   Collection Time: 06/12/15  1:50 PM  Result Value Ref Range Status   Specimen Description BLOOD LEFT ANTECUBITAL  Final   Special Requests   Final    BOTTLES DRAWN AEROBIC AND ANAEROBIC AEB=10CC ANA=7CC   Culture NO GROWTH < 24 HOURS  Final   Report Status PENDING  Incomplete     Labs: Basic Metabolic Panel:  Recent Labs Lab 06/12/15 1340 06/13/15 0414  NA 135 142  K 4.7 3.9  CL 105 115*  CO2 21* 20*  GLUCOSE 115* 77  BUN 41* 30*  CREATININE 2.40* 1.28*  CALCIUM 9.0 8.1*   Liver Function Tests:  Recent Labs Lab 06/12/15 1340  AST 20  ALT 12*  ALKPHOS 67  BILITOT 0.6  PROT 7.3  ALBUMIN 3.7   No results for input(s): LIPASE, AMYLASE in the last 168 hours. No results for input(s): AMMONIA in the last 168 hours. CBC:  Recent Labs Lab 06/12/15 1340 06/13/15 0414  WBC 5.8 3.9*  NEUTROABS 3.9  --   HGB 11.8* 10.5*  HCT 35.3* 31.4*  MCV 95.1 94.9  PLT 176 162   Cardiac Enzymes:  Recent Labs Lab 06/12/15 1340 06/12/15 2202 06/13/15 0052  06/13/15 0414  TROPONINI 0.05* 0.05* 0.05* 0.05*   BNP: BNP (last 3 results) No results for input(s): BNP in the last 8760 hours.  ProBNP (last 3  results) No results for input(s): PROBNP in the last 8760 hours.  CBG: No results for input(s): GLUCAP in the last 168 hours.     SignedLelon Frohlich  [facility] Pager: [phone] 06/13/2015, 2:49 PM

## 2015-06-14 LAB — UREA NITROGEN, URINE: UREA NITROGEN UR: 295 mg/dL

## 2015-06-15 ENCOUNTER — Encounter (HOSPITAL_BASED_OUTPATIENT_CLINIC_OR_DEPARTMENT_OTHER): Payer: Medicare Other | Admitting: Hematology & Oncology

## 2015-06-15 ENCOUNTER — Encounter (HOSPITAL_COMMUNITY): Payer: Self-pay | Admitting: Hematology & Oncology

## 2015-06-15 ENCOUNTER — Encounter (HOSPITAL_BASED_OUTPATIENT_CLINIC_OR_DEPARTMENT_OTHER): Payer: Medicare Other

## 2015-06-15 VITALS — BP 137/55 | HR 66 | Temp 97.7°F | Resp 18

## 2015-06-15 VITALS — BP 136/55 | HR 71 | Temp 98.4°F | Resp 16 | Wt 142.0 lb

## 2015-06-15 DIAGNOSIS — C787 Secondary malignant neoplasm of liver and intrahepatic bile duct: Secondary | ICD-10-CM

## 2015-06-15 DIAGNOSIS — D500 Iron deficiency anemia secondary to blood loss (chronic): Secondary | ICD-10-CM | POA: Diagnosis not present

## 2015-06-15 DIAGNOSIS — Z5112 Encounter for antineoplastic immunotherapy: Secondary | ICD-10-CM

## 2015-06-15 DIAGNOSIS — R239 Unspecified skin changes: Secondary | ICD-10-CM

## 2015-06-15 DIAGNOSIS — C189 Malignant neoplasm of colon, unspecified: Secondary | ICD-10-CM

## 2015-06-15 DIAGNOSIS — R234 Changes in skin texture: Secondary | ICD-10-CM

## 2015-06-15 DIAGNOSIS — D509 Iron deficiency anemia, unspecified: Secondary | ICD-10-CM | POA: Diagnosis not present

## 2015-06-15 DIAGNOSIS — T451X5A Adverse effect of antineoplastic and immunosuppressive drugs, initial encounter: Secondary | ICD-10-CM

## 2015-06-15 LAB — URINE CULTURE: Culture: >=100000 — AB

## 2015-06-15 LAB — COMPREHENSIVE METABOLIC PANEL
ALK PHOS: 59 U/L (ref 38–126)
ALT: 13 U/L — AB (ref 14–54)
AST: 20 U/L (ref 15–41)
Albumin: 3.5 g/dL (ref 3.5–5.0)
Anion gap: 7 (ref 5–15)
BILIRUBIN TOTAL: 0.3 mg/dL (ref 0.3–1.2)
BUN: 21 mg/dL — AB (ref 6–20)
CALCIUM: 8.7 mg/dL — AB (ref 8.9–10.3)
CHLORIDE: 113 mmol/L — AB (ref 101–111)
CO2: 21 mmol/L — ABNORMAL LOW (ref 22–32)
CREATININE: 0.96 mg/dL (ref 0.44–1.00)
GFR, EST NON AFRICAN AMERICAN: 53 mL/min — AB (ref >60)
Glucose, Bld: 96 mg/dL (ref 65–99)
Potassium: 4.6 mmol/L (ref 3.5–5.1)
Sodium: 141 mmol/L (ref 135–145)
TOTAL PROTEIN: 6.8 g/dL (ref 6.5–8.1)

## 2015-06-15 LAB — CBC WITH DIFFERENTIAL/PLATELET
BASOS ABS: 0 10*3/uL (ref 0.0–0.1)
Basophils Relative: 0 %
EOS PCT: 6 %
Eosinophils Absolute: 0.3 10*3/uL (ref 0.0–0.7)
HEMATOCRIT: 30.9 % — AB (ref 36.0–46.0)
Hemoglobin: 10.5 g/dL — ABNORMAL LOW (ref 12.0–15.0)
LYMPHS ABS: 1 10*3/uL (ref 0.7–4.0)
LYMPHS PCT: 22 %
MCH: 32 pg (ref 26.0–34.0)
MCHC: 34 g/dL (ref 30.0–36.0)
MCV: 94.2 fL (ref 78.0–100.0)
Monocytes Absolute: 0.4 10*3/uL (ref 0.1–1.0)
Monocytes Relative: 10 %
NEUTROS ABS: 2.6 10*3/uL (ref 1.7–7.7)
Neutrophils Relative %: 62 %
PLATELETS: 172 10*3/uL (ref 150–400)
RBC: 3.28 MIL/uL — AB (ref 3.87–5.11)
RDW: 13 % (ref 11.5–15.5)
WBC: 4.3 10*3/uL (ref 4.0–10.5)

## 2015-06-15 LAB — MAGNESIUM: MAGNESIUM: 1 mg/dL — AB (ref 1.7–2.4)

## 2015-06-15 MED ORDER — MAGNESIUM SULFATE 4 GM/100ML IV SOLN
4.0000 g | Freq: Once | INTRAVENOUS | Status: AC
  Administered 2015-06-15: 4 g via INTRAVENOUS
  Filled 2015-06-15: qty 100

## 2015-06-15 MED ORDER — SODIUM CHLORIDE 0.9 % IV SOLN
Freq: Once | INTRAVENOUS | Status: AC
  Administered 2015-06-15: 12:00:00 via INTRAVENOUS

## 2015-06-15 MED ORDER — SODIUM CHLORIDE 0.9% FLUSH: 10.0000 mL | INTRAVENOUS | Status: DC | PRN

## 2015-06-15 MED ORDER — SODIUM CHLORIDE 0.9 % IV SOLN
5.0000 mg/kg | Freq: Once | INTRAVENOUS | Status: AC
  Administered 2015-06-15: 340 mg via INTRAVENOUS
  Filled 2015-06-15: qty 17

## 2015-06-15 MED ORDER — MAGNESIUM OXIDE 400 (241.3 MG) MG PO TABS: 400.0000 mg | ORAL_TABLET | Freq: Every day | ORAL | Status: DC

## 2015-06-15 NOTE — Patient Instructions (Addendum)
[place] at [facility] Discharge Instructions  RECOMMENDATIONS MADE BY THE CONSULTANT AND ANY TEST RESULTS WILL BE SENT TO YOUR REFERRING PHYSICIAN.   Exam and discussion by Dr Whitney Muse today Please continue taking magnesium, sent to your pharmacy  Return to see the doctor in 2 weeks Please call the clinic if you have any questions or concerns  YOu can take imodium and lomotil together FOR DIARRHEA: At the first sign of poorly formed or loose stools you should begin taking Imodium(loperamide).  Take two caplets (4mg ) followed by one caplet (2mg ) every 2 hours until you have had no diarrhea for 12 hours.  During the night take two caplets (4mg ) at bedtime and continue every 4 hours during the night until the morning.  Stop taking Imodium only after there is no sign of diarrhea for 12 hours.     Thank you for choosing [place] at [facility] to provide your oncology and hematology care.  To afford each patient quality time with our provider, please arrive at least 15 minutes before your scheduled appointment time.   Beginning January 23rd 2017 lab work for the [name] will be done in the  Main lab at [facility] on 1st floor. If you have a lab appointment with the [facility] please come in thru the  Main Entrance and check in at the main information desk  You need to re-schedule your appointment should you arrive 10 or more minutes late.  We strive to give you quality time with our providers, and arriving late affects you and other patients whose appointments are after yours.  Also, if you no show three or more times for appointments you may be dismissed from the clinic at the providers discretion.     Again, thank you for choosing [facility]  Our hope is that these requests will decrease the amount of time that you wait before being seen by our physicians.        _____________________________________________________________  Should you have questions after your visit to [facility], please contact our office at [phone] between the hours of 8:30 a.m. and 4:30 p.m.  Voicemails left after 4:30 p.m. will not be returned until the following business day.  For prescription refill requests, have your pharmacy contact our office.         Resources For Cancer Patients and their Caregivers ? American Cancer Society: Can assist with transportation, wigs, general needs, runs Look Good Feel Better.        737 074 8346 ? Cancer Care: Provides financial assistance, online support groups, medication/co-pay assistance.  1-800-813-HOPE 304-300-2462) ? [place] Assists [place] cancer patients and their families through emotional , educational and financial support.  934-539-2921 ? [place] DSS Where to apply for food stamps, Medicaid and utility assistance. 2170023522 ? [employer]: Transportation to medical appointments. 484-101-6326 ? Social Security Administration: May apply for disability if have a Stage IV cancer. 209-588-7978 [phone] ? [employer], Disability and Transit Services: Assists with nutrition, care and transit needs. 7253374433

## 2015-06-15 NOTE — Patient Instructions (Addendum)
[facility] Discharge Instructions for Patients Receiving Chemotherapy   Beginning January 23rd 2017 lab work for the [facility] will be done in the  Main lab at [facility] on 1st floor. If you have a lab appointment with the [facility] please come in thru the  Main Entrance and check in at the main information desk   Today you received the following chemotherapy agents Vectibix You also received IV magnesium and you need to restart your oral magnesium again (400 mg daily)  To help prevent nausea and vomiting after your treatment, we encourage you to take your nausea medication  If you develop nausea and vomiting, or diarrhea that is not controlled by your medication, call the clinic.  The clinic phone number is [phone]. Office hours are Monday-Friday 8:30am-5:00pm.  BELOW ARE SYMPTOMS THAT SHOULD BE REPORTED IMMEDIATELY:  *FEVER GREATER THAN 101.0 F  *CHILLS WITH OR WITHOUT FEVER  NAUSEA AND VOMITING THAT IS NOT CONTROLLED WITH YOUR NAUSEA MEDICATION  *UNUSUAL SHORTNESS OF BREATH  *UNUSUAL BRUISING OR BLEEDING  TENDERNESS IN MOUTH AND THROAT WITH OR WITHOUT PRESENCE OF ULCERS  *URINARY PROBLEMS  *BOWEL PROBLEMS  UNUSUAL RASH Items with * indicate a potential emergency and should be followed up as soon as possible. If you have an emergency after office hours please contact your primary care physician or go to the nearest emergency department.  Please call the clinic during office hours if you have any questions or concerns.   You may also contact the Patient Navigator at [phone] should you have any questions or need assistance in obtaining follow up care.      Resources For Cancer Patients and their Caregivers ? American Cancer Society: Can assist with transportation, wigs, general needs, runs Look Good Feel Better.        2120960663 ? Cancer Care: Provides financial assistance, online support groups, medication/co-pay  assistance.  1-800-813-HOPE 276-482-2199) ? [place] Assists [place] cancer patients and their families through emotional , educational and financial support.  864-836-5190 ? [place] DSS Where to apply for food stamps, Medicaid and utility assistance. 303-807-0686 ? [employer]: Transportation to medical appointments. (705)563-7427 ? Social Security Administration: May apply for disability if have a Stage IV cancer. 202-335-0819 [phone])081-9568 ? [employer], Disability and Transit Services: Assists with nutrition, care and transit needs. [phone]

## 2015-06-15 NOTE — Progress Notes (Signed)
Karen [name] tolerated infusion well. Karen [name] did occasionally c/o pain/soreness in lt mid  forearm towards antecubital area. IV is in left wrist with excellent blood return and no redness or swelling. Noted that Karen [name] has bruising at left antecubital area from IV that was previously in for hospital admission. Karen [name] did not want IV site changed for remainder of infusion since it was functioning well.

## 2015-06-15 NOTE — Progress Notes (Signed)
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 Lindalou Hose at [facility]   09/01/2014 Pathology Results Liver, needle/core biopsy - METASTATIC ADENOCARCINOMA.   09/01/2014 Miscellaneous MWA left liver lesion, [name] (IR)   01/18/2015 Progression PET scan   01/18/2015 PET scan Recurrence of  tumor within segment 2 of the liver. There are 2 new lesions identified within segment 5 and segment 6 of the liver worrisome for additional sites of metastasis. Evidence of tumor recurrence at the intra colonic anastomosis is noted...   01/28/2015 - 04/16/2015 Chemotherapy Xeloda 1500 mg BID 7 days on and 7 days off beginning in December 2016.   04/12/2015 Imaging CT C/A/P progression of disease, enlargement of metastatic lesion in segments 2/3 of liver, development of mass with colon at junction of descending colon and sigmoid, indeterminate lesion in lower pole of the left kidney   04/20/2015 -  Antibody Plan Vectibix every 2 weeks, single-agent.   CURRENT THERAPY:  Vectibix IV iron prn  History of Present Illness: Karen [name] returns today for further follow-up of stage IV CRC s/p microwave ablation of a solitary liver lesion. Unfortunately repeat imaging in December revealed other liver disease and recurrence at the prior anastomotic site.  She also has a L renal lesion that is under observation. She has other health issues including chronic  LBP. She has progressed through [place]. She is currently on Vectibix.  Mrs. Petralia returns to the [place] today accompanied by her daughter.  She says that her husband is going to be moved to cardiac rehab, then home after that.  She says she has "five or six places on one finger that hurt so bad." She says she's "tried everything everyone has told me about and it doesn't work." To address her side-effects, she was again presented the option of not doing the treatments, which is not an unreasonable option since she feels better off of the treatments.  She says "I'd rather not have the cancer," saying "I don't want to be sick, but I don't want to have the cancer either." She ultimately says "I'd rather not have the cancer," saying "I have to fight as long as I can, not just for me but for my family, my children and my husband, so I can get better and take care of him." She was again advised that she needs to take care of herself too, and there might come a time where she can't take care of him. She remarks "hurt or no hurt, I will stay as long as I can," and was advised that if she has diarrhea like that again she might not have a choice.  The last time she had the diarrhea, she notes that it lasted for a couple of days. She remarks "It's always been loose," in an effort to minimize it, saying "It's always been real watery." "that's why I didn't pay much attention to it I guess. But when it started filling up so  quickly, as soon as I'd emptied it," that's what alarmed her.  She says "I've been sick a long time; I just fight it."  She has not had blood work done yet today.  Her major issues from therapy involve periungual changes. Recent diarrhea, currently denies. No nausea or vomiting. Food has no taste. No new pain, chronic pain issues remain unchanged. Still reluctant to discuss end of life issues.    MEDICAL HISTORY: Past Medical History  Diagnosis Date  . Chronic diastolic CHF  (congestive heart failure) (HCC)     a. nl EF by echo 05/2009.  Marland Kitchen DVT of deep femoral vein (HCC)   . History of PSVT (paroxysmal supraventricular tachycardia)   . Hypertension   . Abdominal adhesions   . Ileostomy present (HCC)   . Colon polyp   . Chest pain     a. 2002 Cath: nl cors;  b. 2009 aden mv: nl;  c. 05/2009 Echo: nl. d. 2014: normal nuclear stress test, EF 69%.  . Wears dentures   . Breast lump   . Hyperlipidemia   . Thyroid disease   . Anemia   . History of blood clots   . Hernia   . Blood transfusion   . Hearing loss   . Arthritis   . GERD (gastroesophageal reflux disease)   . Cancer (HCC)     bladder  . Colon cancer (HCC)     a. recurrence 2016 with liver mets -  percutaneous liver biopsy and thermal ablation of a liver metastasis by interventional radiology..  . Iron deficiency anemia due to chronic blood loss 07/06/2013  . UTI (urinary tract infection)   . Obstruction of intestine or colon (HCC) 06/2014  . Diabetes mellitus without complication (HCC) 08/10/14    borderline type II; takes no medication for it  . Vision abnormalities 2016    not going blind but having retina problems; might lose ability to see faces  . LBBB (left bundle branch block)   . Asthma     hx of  . Depression   . CKD (chronic kidney disease), stage III     pt. states decreased kidney function  . Headache   . Fibromyalgia   . Achalasia     a. s/p botox injection for achalasia.  . Sinus bradycardia     a. HR 30s in 08/2014 requiring holding of digoxin.  . Family history of colon cancer   . Family history of bladder cancer   . Family history of breast cancer     has Left bundle branch block; Chest pain, exertional; Benign hypertensive heart disease without heart failure; Hypothyroid; Small bowel obstruction, partial (HCC); Dysphagia; Paroxysmal SVT (supraventricular tachycardia) (HCC); Ileostomy status (HCC); Unstable angina (HCC); Parastomal hernia; Iron deficiency anemia due to chronic  blood loss; Malabsorption of iron; Acute abdominal pain; Acute renal failure (HCC); Hyperglycemia; UTI (urinary tract infection); Small bowel obstruction (HCC); Abdominal pain, acute; Liver mass, left lobe; Colon cancer metastasized to liver [facility]); Liver lesion; Sinus bradycardia; Family history of colon cancer; Family history of bladder cancer; Family history of breast cancer; Genetic testing; Counseling regarding end of life decision making; Neoplasm related pain; AKI (acute kidney injury) (HCC); Urinary tract infection; Hypotension; Diarrhea; Elevated troponin; and CKD (chronic kidney disease) stage 3, GFR 30-59 ml/min on her problem list.     is allergic to bactrim; codeine; demerol; lidocaine hcl; morphine and related; dilaudid; nitrofurantoin monohyd macro; lisinopril; and ramipril.   Iron Infusion in March 2016. Mammogram performed  in 2015. Reported multiple hernias.   SURGICAL HISTORY: Past Surgical History  Procedure Laterality Date  . Cardiac catheterization  12/10/2000    THE LEFT VENTRICLE IS MILDY DILATED. THERE IS MILD TO MODERATE DIFFUSE HYPOKINESIS WITH EF 35%  . Colon cancer surgery    . Ileostomy    . Esophagogastroduodenoscopy  02/13/2011    Procedure: ESOPHAGOGASTRODUODENOSCOPY (EGD);  Surgeon: Rogene Houston, MD;  Location: [facility];  Service: Endoscopy;  Laterality: N/A;  1030  . Colonoscopy  09/26/2011    Procedure: COLONOSCOPY;  Surgeon: Rogene Houston, MD;  Location: [facility];  Service: Endoscopy;  Laterality: N/A;  215  . Cataract extraction w/phaco  11/13/2011    Procedure: CATARACT EXTRACTION PHACO AND INTRAOCULAR LENS PLACEMENT (IOC);  Surgeon: Tonny Branch, MD;  Location: [facility] ORS;  Service: Ophthalmology;  Laterality: Right;  CDE 12.26  . Cataract extraction w/phaco  12/08/2011    Procedure: CATARACT EXTRACTION PHACO AND INTRAOCULAR LENS PLACEMENT (IOC);  Surgeon: Tonny Branch, MD;  Location: [facility];  Service: Ophthalmology;  Laterality: Left;  CDE 15.11  .  Esophagogastroduodenoscopy N/A 09/01/2013    Procedure: ESOPHAGOGASTRODUODENOSCOPY (EGD);  Surgeon: Rogene Houston, MD;  Location: [facility];  Service: Endoscopy;  Laterality: N/A;  830  . Botox injection N/A 09/01/2013    Procedure: BOTOX INJECTION;  Surgeon: Rogene Houston, MD;  Location: [facility];  Service: Endoscopy;  Laterality: N/A;  . Abdominal hysterectomy    . Cholecystectomy    . Joint replacement Left 2008  . Tonsillectomy    . Colon surgery    . Eye surgery  2014    catarac surgery  . Appendectomy    . Ovarian cystectomy 1955    . Ileostomy    . Coronary angioplasty    . Rotator cuff repair    . Breast surgery      right breast biopsy  . Liver biopsy and ablation  09/01/14    SOCIAL HISTORY: Social History   Social History  . Marital Status: Married    Spouse Name: N/A  . Number of Children: 4  . Years of Education: N/A   Occupational History  . Not on file.   Social History Main Topics  . Smoking status: Former Smoker -- 0.25 packs/day for 13 years    Types: Cigarettes    Quit date: 02/11/1963  . Smokeless tobacco: Never Used  . Alcohol Use: No  . Drug Use: No  . Sexual Activity: Not on file   Other Topics Concern  . Not on file   Social History Narrative   Reads inspirational books Has 56 Great-Grandchildren and 21 Grandchildren  Father died 5 Mother died 82 Oldest brother died 51 FAMILY HISTORY: Family History  Problem Relation Age of Onset  . Stroke    . Hypertension    . Heart disease Mother   . Stroke Mother     deceased  . Arthritis Mother   . Bladder Cancer Mother     dx in her 43s  . Colon cancer Mother   . Arthritis Father   . Heart disease Father     decesaed  . Leukemia Father   . Stroke Sister     alive/debilitated  . Hypertension Sister   . Other Sister     paralysis  . Heart disease Brother     bypass surgery  . Arthritis Brother   . Colon cancer Brother   . Bladder Cancer Brother   . Stroke  Sister      alive/debilitated  . Diabetes Sister   . Other Brother     stomach problems  . Other Brother     bladder  . Colon cancer Paternal Aunt   . Bladder Cancer Other     dx twice in her 30s-40s  . Breast cancer Other     great niece dx in her early 69s  . Prostate cancer Other     newphew  . Colon polyps Daughter      Review of Systems  Constitutional: Negative for fever, chills, weight loss and malaise/fatigue.  HENT: Positive for hearing loss. Negative for congestion, nosebleeds, sore throat and tinnitus.   Eyes: Negative for blurred vision, double vision, pain and discharge.  Respiratory: Negative for cough, hemoptysis, sputum production, shortness of breath and wheezing.   Cardiovascular: Negative for chest pain, palpitations, claudication, leg swelling and PND.  Gastrointestinal:  Negative for heartburn, abdominal pain, diarrhea, constipation, blood in stool and melena. ALTERED TASTE Genitourinary: Negative for dysuria, urgency, frequency and hematuria.  Musculoskeletal: Positive for joint pain. Negative for myalgias and falls.  Skin: EGFR therapy related changes Neurological: Positive for weakness. Negative for dizziness, tingling, tremors, sensory change, speech change, focal weakness, seizures, loss of consciousness and headaches.  Endo/Heme/Allergies: Does not bruise/bleed easily.  Psychiatric/Behavioral: Negative for depression, suicidal ideas, memory loss and substance abuse. The patient is not nervous/anxious and does not have insomnia.   14 point review of systems was performed and is negative except as detailed under history of present illness and above   PHYSICAL EXAMINATION Vitals - 1 value per visit 05/14/1538  SYSTOLIC 086  DIASTOLIC 55  Pulse 71  Temperature 98.4  Respirations 16  Weight (lb) 142  Height   BMI 27.73  VISIT REPORT    ECOG PERFORMANCE STATUS: 1 - Symptomatic but completely ambulatory   Physical Exam  Constitutional: She is oriented to person,  place, and time and well-developed, well-nourished, and in no distress. HENT:  Head: Normocephalic and atraumatic. R ear with small deformity from prior surgery Nose: Nose normal.  Mouth/Throat: Oropharynx is clear and moist. No oropharyngeal exudate.  Eyes: Conjunctivae and EOM are normal. Pupils are equal, round, and reactive to light. Right eye exhibits no discharge. Left eye exhibits no discharge. No scleral icterus.  Neck: Normal range of motion. Neck supple. No tracheal deviation present. No thyromegaly present.  Cardiovascular: Normal rate, regular rhythm and normal heart sounds.  Exam reveals no gallop and no friction rub.   No murmur heard. Pulmonary/Chest: Effort normal and breath sounds normal. She has no wheezes. She has no rales.  Abdominal: Soft. Bowel sounds are normal. . There is no tenderness. There is no rebound and no guarding. ostomy site is intact with watery dark stool. Hernia noted. Incision sites all WNL Musculoskeletal: Normal range of motion. She exhibits no edema.  Lymphadenopathy:    She has no cervical adenopathy.  Neurological: She is alert and oriented to person, place, and time. She has normal reflexes. No cranial nerve deficit.  Skin: Skin is warm and dry. Mild vectibix facial rash. Mild associated nail changes/periungual splitting (grade 1)  Psychiatric: Mood, memory, affect and judgment normal.  Nursing note and vitals reviewed.   LABORATORY DATA: I have reviewed the data as listed. CBC    Component Value Date/Time   WBC 3.9* 06/13/2015 0414   RBC 3.31* 06/13/2015 0414   RBC 3.45* 11/24/2008 1500   HGB 10.5* 06/13/2015 0414   HCT 31.4* 06/13/2015 0414  PLT 162 06/13/2015 0414   MCV 94.9 06/13/2015 0414   MCH 31.7 06/13/2015 0414   MCHC 33.4 06/13/2015 0414   RDW 12.9 06/13/2015 0414   LYMPHSABS 1.2 06/12/2015 1340   MONOABS 0.5 06/12/2015 1340   EOSABS 0.2 06/12/2015 1340   BASOSABS 0.0 06/12/2015 1340   CMP     Component Value Date/Time     NA 142 06/13/2015 0414   K 3.9 06/13/2015 0414   CL 115* 06/13/2015 0414   CO2 20* 06/13/2015 0414   GLUCOSE 77 06/13/2015 0414   BUN 30* 06/13/2015 0414   CREATININE 1.28* 06/13/2015 0414   CREATININE 0.95* 12/07/2014 1512   CALCIUM 8.1* 06/13/2015 0414   PROT 7.3 06/12/2015 1340   ALBUMIN 3.7 06/12/2015 1340   AST 20 06/12/2015 1340   ALT 12* 06/12/2015 1340   ALKPHOS 67 06/12/2015 1340   BILITOT 0.6 06/12/2015 1340   GFRNONAA 38* 06/13/2015 0414   GFRNONAA 56* 12/07/2014 1512   GFRAA 44* 06/13/2015 0414   GFRAA 64 12/07/2014 1512    Results for Majchrzak, TERRALYN MATSUMURA (MRN [id number]) as of 06/19/2015 08:57  Ref. Range 04/06/2015 12:26 04/20/2015 10:14 05/18/2015 10:15 06/01/2015 09:44 06/15/2015 11:04  CEA Latest Ref Range: 0.0-4.7 ng/mL 96.0 (H) 109.6 (H) 234.8 (H) 161.0 (H) 142.4 (H)      RADIOLOGY: I have reviewed the images detailed below and agree with the results:  Study Result     CLINICAL DATA: 82 year old female with history of colon cancer with metastatic disease to the liver. Currently undergoing chemotherapy. Followup study.  EXAM: CT CHEST, ABDOMEN, AND PELVIS WITH CONTRAST  TECHNIQUE: Multidetector CT imaging of the chest, abdomen and pelvis was performed following the standard protocol during bolus administration of intravenous contrast.  CONTRAST: 76m OMNIPAQUE IOHEXOL 300 MG/ML SOLN  COMPARISON: Multiple priors, most recently PET-CT 01/18/2015.  FINDINGS: CT CHEST FINDINGS  Mediastinum/Lymph Nodes: Heart size is mildly enlarged. There is no significant pericardial fluid, thickening or pericardial calcification. There is atherosclerosis of the thoracic aorta, the great vessels of the mediastinum and the coronary arteries, including calcified atherosclerotic plaque in the left anterior descending and left circumflex coronary arteries. Calcifications of the mitral annulus. No pathologically enlarged mediastinal or hilar lymph nodes. Mild  circumferential thickening of the distal half of the esophagus. No discrete esophageal mass identified. No axillary lymphadenopathy.  Lungs/Pleura: A few scattered tiny sub cm subpleural nodules are noted, largest of which is a 3 mm nodule in the posterior aspect of the left lower lobe. These are unchanged, presumably a benign subpleural lymph nodes. No other larger more suspicious appearing pulmonary nodules or masses are noted to suggest metastatic disease to the lungs. Mild scarring in the right lower lobe. No acute consolidative airspace disease. No pleural effusions.  Musculoskeletal/Soft Tissues: There are no aggressive appearing lytic or blastic lesions noted in the visualized portions of the skeleton.  CT ABDOMEN AND PELVIS FINDINGS  Hepatobiliary: Interval increase in size of a hypovascular heterogeneously enhancing lesion in segments 2 and 3 of the liver, which currently measures 3.1 x 2.0 x 2.3 cm (axial image 48 of series 7 and coronal image 21 of series 9). There is also a a 7 mm well-defined low-attenuation lesion in the central aspect of segment 7 of the liver, too small to definitively characterize, but similar to the prior study, likely tiny cysts. No new hepatic lesions are noted. No intra or extrahepatic biliary ductal dilatation. Status post cholecystectomy.  Pancreas: Fatty infiltration in the head of the  pancreas, similar prior studies. No pancreatic mass. No pancreatic ductal dilatation. No pancreatic or peripancreatic fluid or inflammatory changes.  Spleen: Unremarkable.  Adrenals/Urinary Tract: In the anterior aspect of the lower pole of the left kidney there is a well-circumscribed 1.1 x 1.4 cm lesion (image 33 of series 2) which measures 62 HU on precontrast images, 63 HU on arterial phase images, 79 HU on portal venous phase images, and 68 HU on delayed images. This lesion is only slightly larger than prior study 07/13/2014, at which point  this measured 12 x 9 mm. 1.8 cm low-attenuation lesion in the interpolar region of the right kidney is compatible with a simple cyst. Sub cm low-attenuation lesion in the interpolar region of the left kidney is too small to definitively characterize, but is likely a cyst. No hydroureteronephrosis or perinephric stranding. Urinary bladder is obscured by beam hardening artifact from the patient's left hip arthroplasty. Bilateral adrenal glands are normal in appearance.  Stomach/Bowel: Normal appearance of the stomach. No pathologic dilatation of small bowel or remaining colon. Status post right hemicolectomy with right lower quadrant ileostomy. Laxity of the lower right anterior abdominal wall. Small parastomal hernia containing several loops of small bowel. Notably, although the remaining portions of transverse, descending, sigmoid colon and rectum are decompressed, in the left lower quadrant (image 85 of series 7) there is a new soft tissue mass which appears to be intimately associated with the colon at the junction of descending colon and sigmoid colon, which measures approximately 2.9 x 4.1 x 4.5 cm, concerning for new colonic neoplasm. The possibility of a peritoneal implant in this region is not entirely excluded.  Vascular/Lymphatic: Atherosclerosis throughout the abdominal and pelvic vasculature, without evidence of aneurysm or dissection. No lymphadenopathy noted in the abdomen or pelvis.  Reproductive: Status post hysterectomy. Ovaries are not confidently identified may be surgically absent or atrophic (much of the pelvis is obscured by beam hardening artifact from the patient's left hip prosthesis).  Other: No significant volume of ascites. No pneumoperitoneum.  Musculoskeletal: Status post left total hip arthroplasty. There are no aggressive appearing lytic or blastic lesions noted in the visualized portions of the skeleton.  IMPRESSION: 1. Progression of  disease as evidenced by enlargement of the metastatic lesion in segments 2/3 of the liver. 2. In addition, there has been interval development of a 2.9 x 4.1 x 4.5 cm mass which appears intimately associated with the remaining portion of the colon at the junction of the descending colon and sigmoid colon. This appears to arise from this portion of the colon and is favored to represent a new colonic neoplasm. The possibility of a solitary peritoneal implant in this region is not entirely excluded. This could be further evaluated with colonoscopy if clinically appropriate. 3. No signs of metastatic disease to the thorax. 4. 1.1 x 1.4 cm indeterminate lesion in the lower pole of the left kidney. This is favored to represent a proteinaceous cyst with apparent pseudoenhancement on today's examination secondary to the small size of the lesion. However, this lesion was mildly complex on prior MRI of the abdomen 12/08/2014. Strictly speaking, the possibility of a small slow-growing cystic neoplasm is not entirely excluded. Close attention on followup studies is recommended. 5. Circumferential thickening of the distal half of the esophagus. This may simply reflect esophagitis, however, if there is any clinical concern for Barrett's metaplasia or esophageal neoplasia, further evaluation with endoscopy should be considered. 6. Mild cardiomegaly. 7. Atherosclerosis, including 2 vessel coronary artery disease. 8.  Additional incidental findings, as above.   Electronically Signed  By: Vinnie Langton M.D.  On: 04/12/2015 15:18    ASSESSMENT and THERAPY PLAN:  Colon carcinoma with permanent ostomy Diversion Colitis Achalasia Iron deficiency, anemia secondary to GI related blood loss Hemeoccult positive stools Rising CEA with CT imaging on 07/13/2014 1.3 cm mass in L hepatic lobe Subcapsular mass in anterior pole of L kidney suspicious for RCC Biopsy of L liver lesion and MWA Left liver  lesion 09/01/2014 Taste Alteration KRAS WT XELODA therapy, week on, week off started 01/2015 Progressive disease Difficulty discussing end of life decision making [facility] admission for diarrhea ? Vectibix induced Hypomagnesium  1. I discussed with her that the vectibix could have been the cause of her diarrhea. There are no dose modifications for diarrhea. She wants to proceed. I have discussed immodium use and urged her to use as prescribed and to call if she develops prior symptoms. I have reduced her dose slightly. She is also given a prescription for lomotil 2. We discussed end of life issues again, she remains resistnat 3. She needs to restart her magnesium  I will see her back before her next treatment in two weeks.  All questions were answered. The patient knows to call the clinic with any problems, questions or concerns. We can certainly see the patient much sooner if necessary.   This document was electronically signed.   This document serves as a record of services personally performed by Ancil Linsey, MD. It was created on her behalf by Toni Amend, a trained medical scribe. The creation of this record is based on the scribe's personal observations and the provider's statements to them. This document has been checked and approved by the attending provider.  I have reviewed the above documentation for accuracy and completeness, and I agree with the above.  Kelby Fam. Whitney Muse, MD

## 2015-06-16 ENCOUNTER — Encounter (HOSPITAL_COMMUNITY): Payer: Self-pay | Admitting: Hematology & Oncology

## 2015-06-16 LAB — CEA: CEA: 142.4 ng/mL — AB (ref 0.0–4.7)

## 2015-06-17 LAB — CULTURE, BLOOD (ROUTINE X 2)
Culture: NO GROWTH
Culture: NO GROWTH

## 2015-06-19 ENCOUNTER — Encounter (HOSPITAL_COMMUNITY): Payer: Self-pay | Admitting: Hematology & Oncology

## 2015-06-20 DIAGNOSIS — C189 Malignant neoplasm of colon, unspecified: Secondary | ICD-10-CM | POA: Diagnosis not present

## 2015-06-20 DIAGNOSIS — C787 Secondary malignant neoplasm of liver and intrahepatic bile duct: Secondary | ICD-10-CM | POA: Diagnosis not present

## 2015-06-20 DIAGNOSIS — I5030 Unspecified diastolic (congestive) heart failure: Secondary | ICD-10-CM | POA: Diagnosis not present

## 2015-06-20 DIAGNOSIS — I471 Supraventricular tachycardia: Secondary | ICD-10-CM | POA: Diagnosis not present

## 2015-06-20 DIAGNOSIS — I10 Essential (primary) hypertension: Secondary | ICD-10-CM | POA: Diagnosis not present

## 2015-06-20 DIAGNOSIS — Z433 Encounter for attention to colostomy: Secondary | ICD-10-CM | POA: Diagnosis not present

## 2015-06-21 DIAGNOSIS — R300 Dysuria: Secondary | ICD-10-CM | POA: Diagnosis not present

## 2015-06-29 ENCOUNTER — Encounter (HOSPITAL_COMMUNITY): Payer: Self-pay | Admitting: Hematology & Oncology

## 2015-06-29 ENCOUNTER — Telehealth (HOSPITAL_COMMUNITY): Payer: Self-pay | Admitting: *Deleted

## 2015-06-29 ENCOUNTER — Encounter (HOSPITAL_BASED_OUTPATIENT_CLINIC_OR_DEPARTMENT_OTHER): Payer: Medicare Other

## 2015-06-29 ENCOUNTER — Encounter (HOSPITAL_BASED_OUTPATIENT_CLINIC_OR_DEPARTMENT_OTHER): Payer: Medicare Other | Admitting: Hematology & Oncology

## 2015-06-29 ENCOUNTER — Encounter (HOSPITAL_COMMUNITY): Payer: Medicare Other

## 2015-06-29 ENCOUNTER — Other Ambulatory Visit (HOSPITAL_COMMUNITY): Payer: Self-pay | Admitting: *Deleted

## 2015-06-29 VITALS — BP 122/51 | HR 70 | Temp 98.2°F | Resp 20 | Wt 148.0 lb

## 2015-06-29 VITALS — BP 125/59 | HR 61 | Temp 97.4°F | Resp 16

## 2015-06-29 DIAGNOSIS — G8929 Other chronic pain: Secondary | ICD-10-CM | POA: Diagnosis not present

## 2015-06-29 DIAGNOSIS — C787 Secondary malignant neoplasm of liver and intrahepatic bile duct: Secondary | ICD-10-CM

## 2015-06-29 DIAGNOSIS — Z5112 Encounter for antineoplastic immunotherapy: Secondary | ICD-10-CM | POA: Diagnosis not present

## 2015-06-29 DIAGNOSIS — C189 Malignant neoplasm of colon, unspecified: Secondary | ICD-10-CM

## 2015-06-29 DIAGNOSIS — D500 Iron deficiency anemia secondary to blood loss (chronic): Secondary | ICD-10-CM

## 2015-06-29 DIAGNOSIS — D509 Iron deficiency anemia, unspecified: Secondary | ICD-10-CM | POA: Diagnosis not present

## 2015-06-29 DIAGNOSIS — M25559 Pain in unspecified hip: Secondary | ICD-10-CM

## 2015-06-29 LAB — COMPREHENSIVE METABOLIC PANEL
ALBUMIN: 3.5 g/dL (ref 3.5–5.0)
ALT: 15 U/L (ref 14–54)
AST: 24 U/L (ref 15–41)
Alkaline Phosphatase: 52 U/L (ref 38–126)
Anion gap: 5 (ref 5–15)
BUN: 17 mg/dL (ref 6–20)
CHLORIDE: 110 mmol/L (ref 101–111)
CO2: 26 mmol/L (ref 22–32)
CREATININE: 0.85 mg/dL (ref 0.44–1.00)
Calcium: 8.3 mg/dL — ABNORMAL LOW (ref 8.9–10.3)
GFR calc Af Amer: >60 mL/min (ref >60)
GLUCOSE: 92 mg/dL (ref 65–99)
POTASSIUM: 3.8 mmol/L (ref 3.5–5.1)
Sodium: 141 mmol/L (ref 135–145)
Total Bilirubin: 0.4 mg/dL (ref 0.3–1.2)
Total Protein: 6.7 g/dL (ref 6.5–8.1)

## 2015-06-29 LAB — CBC WITH DIFFERENTIAL/PLATELET
BASOS ABS: 0 10*3/uL (ref 0.0–0.1)
BASOS PCT: 0 %
EOS PCT: 7 %
Eosinophils Absolute: 0.3 10*3/uL (ref 0.0–0.7)
HEMATOCRIT: 32.7 % — AB (ref 36.0–46.0)
Hemoglobin: 11 g/dL — ABNORMAL LOW (ref 12.0–15.0)
LYMPHS PCT: 38 %
Lymphs Abs: 1.5 10*3/uL (ref 0.7–4.0)
MCH: 31.8 pg (ref 26.0–34.0)
MCHC: 33.6 g/dL (ref 30.0–36.0)
MCV: 94.5 fL (ref 78.0–100.0)
Monocytes Absolute: 0.4 10*3/uL (ref 0.1–1.0)
Monocytes Relative: 10 %
NEUTROS ABS: 1.7 10*3/uL (ref 1.7–7.7)
Neutrophils Relative %: 45 %
PLATELETS: 191 10*3/uL (ref 150–400)
RBC: 3.46 MIL/uL — AB (ref 3.87–5.11)
RDW: 12.9 % (ref 11.5–15.5)
WBC: 3.8 10*3/uL — AB (ref 4.0–10.5)

## 2015-06-29 LAB — MAGNESIUM: Magnesium: 1 mg/dL — ABNORMAL LOW (ref 1.7–2.4)

## 2015-06-29 MED ORDER — SODIUM CHLORIDE 0.9% FLUSH
10.0000 mL | INTRAVENOUS | Status: DC | PRN
  Administered 2015-06-29: 10 mL
  Filled 2015-06-29: qty 10

## 2015-06-29 MED ORDER — FENTANYL 25 MCG/HR TD PT72: 25.0000 ug | MEDICATED_PATCH | TRANSDERMAL | Status: DC

## 2015-06-29 MED ORDER — SODIUM CHLORIDE 0.9 % IV SOLN
Freq: Once | INTRAVENOUS | Status: AC
Start: 2015-06-29 — End: 2015-06-29
  Administered 2015-06-29: 12:00:00 via INTRAVENOUS

## 2015-06-29 MED ORDER — SODIUM CHLORIDE 0.9 % IV SOLN
6.0000 mg/kg | Freq: Once | INTRAVENOUS | Status: AC
  Administered 2015-06-29: 400 mg via INTRAVENOUS
  Filled 2015-06-29: qty 20

## 2015-06-29 MED ORDER — MAGNESIUM SULFATE 4 GM/100ML IV SOLN
4.0000 g | Freq: Once | INTRAVENOUS | Status: AC
  Administered 2015-06-29: 4 g via INTRAVENOUS
  Filled 2015-06-29: qty 100

## 2015-06-29 NOTE — Progress Notes (Signed)
1420:  Tolerated infusions w/o adverse reaction.  A&Ox4, in no distress.  VSS.  Discharged via wheelchair in c/o daughter for transport home.

## 2015-06-29 NOTE — Progress Notes (Signed)
[facility], [facility] [place] / [place] [address]   DIAGNOSIS: Colon carcinoma with permanent ostomy Diversion Colitis Achalasia Iron deficiency, anemia secondary to GI related blood loss Hemeoccult positive stools Rising CEA with CT imaging on 07/13/2014 1.3 cm mass in L hepatic lobe Subcapsular mass in anterior pole of L kidney suspicious for RCC Cutaneous microwave ablation of her now biopsy-proven metastatic colonic adenocarcinoma to the left liver 09/01/2014   Colon cancer metastasized to liver [facility])   04/12/1987 Definitive Surgery Dr. Lindalou Hose at [facility]   09/01/2014 Pathology Results Liver, needle/core biopsy - METASTATIC ADENOCARCINOMA.   09/01/2014 Miscellaneous MWA left liver lesion, [name] (IR)   01/18/2015 Progression PET scan   01/18/2015 PET scan Recurrence of  tumor within segment 2 of the liver. There are 2 new lesions identified within segment 5 and segment 6 of the liver worrisome for additional sites of metastasis. Evidence of tumor recurrence at the intra colonic anastomosis is noted...   01/28/2015 - 04/16/2015 Chemotherapy Xeloda 1500 mg BID 7 days on and 7 days off beginning in December 2016.   04/12/2015 Imaging CT C/A/P progression of disease, enlargement of metastatic lesion in segments 2/3 of liver, development of mass with colon at junction of descending colon and sigmoid, indeterminate lesion in lower pole of the left kidney   04/20/2015 -  Antibody Plan Vectibix every 2 weeks, single-agent.   06/12/2015 - 06/13/2015 Hospital Admission Hypotension, diarrhea, ARF, UTI   CURRENT THERAPY:  Vectibix IV iron prn  History of Present Illness: Rianne returns today for further follow-up of stage IV CRC s/p microwave ablation of a solitary liver lesion. Unfortunately repeat imaging in December revealed other liver disease and recurrence at the prior anastomotic site.  She also has a L renal lesion  that is under observation. She has other health issues including chronic LBP. She has progressed through [place]. She is currently on Vectibix.  Mrs. Vanecek returns to the [facility] today accompanied by her daughter. She is in a wheelchair and appears tired.  She says she's been spending the week at the nursing center with her husband. He's doing really well, and they brought him home yesterday morning. The therapist is supposed to come for him today, and a nurse is coming on Saturday. She repeats that he is doing really good, and that he hasn't had any pain. She says she's tired; "we're all tired" from taking care of him. She notes that she stayed with him at night this week.  Her Mg is low, and she says she is taking Mg per "how much you gave me." She will have to go up on that, with each meal. She asks if this chemo she's taking, if there are any other chemotherapies available that won't affect her magnesium level.   She notes "I've been just about eating immodium and lomotil," in terms of her watery stools. She was reminded that, if she can't keep it controlled, she has to let us know up here. Mrs. Amir says "I just keep taking the immodium." She says "when it gets watery, I just take another pill. She says "it'll thicken up, but then maybe next time it will be watery again." She says "it's never been real thick; it's always been watery." She indicates that she knows when it's wrong, when it "just keeps filling up," and it's too much.   She still has chronic hip  pain. Appetite is unchanged. Weight is stable.  MEDICAL HISTORY: Past Medical History  Diagnosis Date  . Chronic diastolic CHF (congestive heart failure) (HCC)     a. nl EF by echo 05/2009.  Marland Kitchen DVT of deep femoral vein ([place])   . History of PSVT (paroxysmal supraventricular tachycardia)   . Hypertension   . Abdominal adhesions   . Ileostomy present ([place])   . Colon polyp   . Chest pain     a. 2002 Cath: nl cors;  b. 2009  aden mv: nl;  c. 05/2009 Echo: nl. d. 2014: normal nuclear stress test, EF 69%.  . Wears dentures   . Breast lump   . Hyperlipidemia   . Thyroid disease   . Anemia   . History of blood clots   . Hernia   . Blood transfusion   . Hearing loss   . Arthritis   . GERD (gastroesophageal reflux disease)   . Cancer ([place])     bladder  . Colon cancer ([place])     a. recurrence 2016 with liver mets -  percutaneous liver biopsy and thermal ablation of a liver metastasis by interventional radiology..  . Iron deficiency anemia due to chronic blood loss 07/06/2013  . UTI (urinary tract infection)   . Obstruction of intestine or colon ([place]) 06/2014  . Diabetes mellitus without complication ([place]) 6/73/41    borderline type II; takes no medication for it  . Vision abnormalities 2016    not going blind but having retina problems; might lose ability to see faces  . LBBB (left bundle branch block)   . Asthma     hx of  . Depression   . CKD (chronic kidney disease), stage III     pt. states decreased kidney function  . Headache   . Fibromyalgia   . Achalasia     a. s/p botox injection for achalasia.  . Sinus bradycardia     a. HR 30s in 08/2014 requiring holding of digoxin.  . Family history of colon cancer   . Family history of bladder cancer   . Family history of breast cancer     has Left bundle branch block; Chest pain, exertional; Benign hypertensive heart disease without heart failure; Hypothyroid; Small bowel obstruction, partial ([place]); Dysphagia; Paroxysmal SVT (supraventricular tachycardia) ([place]); Ileostomy status ([place]); Unstable angina ([place]); Parastomal hernia; Iron deficiency anemia due to chronic blood loss; Malabsorption of iron; Acute abdominal pain; Acute renal failure ([place]); Hyperglycemia; UTI (urinary tract infection); Small bowel obstruction ([place]); Abdominal pain, acute; Liver mass, left lobe; Colon cancer metastasized to liver [facility]); Liver lesion; Sinus bradycardia; Family history of  colon cancer; Family history of bladder cancer; Family history of breast cancer; Genetic testing; Counseling regarding end of life decision making; Neoplasm related pain; AKI (acute kidney injury) ([place]); Urinary tract infection; Hypotension; Diarrhea; Elevated troponin; and CKD (chronic kidney disease) stage 3, GFR 30-59 ml/min on her problem list.     is allergic to bactrim; codeine; demerol; lidocaine hcl; morphine and related; dilaudid; nitrofurantoin monohyd macro; lisinopril; and ramipril.   Iron Infusion in March 2016. Mammogram performed in 2015. Reported multiple hernias.   SURGICAL HISTORY: Past Surgical History  Procedure Laterality Date  . Cardiac catheterization  12/10/2000    THE LEFT VENTRICLE IS MILDY DILATED. THERE IS MILD TO MODERATE DIFFUSE HYPOKINESIS WITH EF 35%  . Colon cancer surgery    . Ileostomy    . Esophagogastroduodenoscopy  02/13/2011    Procedure: ESOPHAGOGASTRODUODENOSCOPY (EGD);  Surgeon:  Rogene Houston, MD;  Location: [facility];  Service: Endoscopy;  Laterality: N/A;  1030  . Colonoscopy  09/26/2011    Procedure: COLONOSCOPY;  Surgeon: Rogene Houston, MD;  Location: [facility];  Service: Endoscopy;  Laterality: N/A;  215  . Cataract extraction w/phaco  11/13/2011    Procedure: CATARACT EXTRACTION PHACO AND INTRAOCULAR LENS PLACEMENT (IOC);  Surgeon: Tonny Branch, MD;  Location: [facility] ORS;  Service: Ophthalmology;  Laterality: Right;  CDE 12.26  . Cataract extraction w/phaco  12/08/2011    Procedure: CATARACT EXTRACTION PHACO AND INTRAOCULAR LENS PLACEMENT (IOC);  Surgeon: Tonny Branch, MD;  Location: [facility] ORS;  Service: Ophthalmology;  Laterality: Left;  CDE 15.11  . Esophagogastroduodenoscopy N/A 09/01/2013    Procedure: ESOPHAGOGASTRODUODENOSCOPY (EGD);  Surgeon: Rogene Houston, MD;  Location: [facility];  Service: Endoscopy;  Laterality: N/A;  830  . Botox injection N/A 09/01/2013    Procedure: BOTOX INJECTION;  Surgeon: Rogene Houston, MD;  Location: [facility]  ENDO SUITE;  Service: Endoscopy;  Laterality: N/A;  . Abdominal hysterectomy    . Cholecystectomy    . Joint replacement Left 2008  . Tonsillectomy    . Colon surgery    . Eye surgery  2014    catarac surgery  . Appendectomy    . Ovarian cystectomy 1955    . Ileostomy    . Coronary angioplasty    . Rotator cuff repair    . Breast surgery      right breast biopsy  . Liver biopsy and ablation  09/01/14    SOCIAL HISTORY: Social History   Social History  . Marital Status: Married    Spouse Name: N/A  . Number of Children: 4  . Years of Education: N/A   Occupational History  . Not on file.   Social History Main Topics  . Smoking status: Former Smoker -- 0.25 packs/day for 13 years    Types: Cigarettes    Quit date: 02/11/1963  . Smokeless tobacco: Never Used  . Alcohol Use: No  . Drug Use: No  . Sexual Activity: Not on file   Other Topics Concern  . Not on file   Social History Narrative   Reads inspirational books Has 17 Great-Grandchildren and 95 Grandchildren  Father died 41 Mother died 57 Oldest brother died 44  FAMILY HISTORY: Family History  Problem Relation Age of Onset  . Stroke    . Hypertension    . Heart disease Mother   . Stroke Mother     deceased  . Arthritis Mother   . Bladder Cancer Mother     dx in her 28s  . Colon cancer Mother   . Arthritis Father   . Heart disease Father     decesaed  . Leukemia Father   . Stroke Sister     alive/debilitated  . Hypertension Sister   . Other Sister     paralysis  . Heart disease Brother     bypass surgery  . Arthritis Brother   . Colon cancer Brother   . Bladder Cancer Brother   . Stroke Sister     alive/debilitated  . Diabetes Sister   . Other Brother     stomach problems  . Other Brother     bladder  . Colon cancer Paternal Aunt   . Bladder Cancer Other     dx twice in her 30s-40s  . Breast cancer Other     great niece dx in her  early 4s  . Prostate cancer Other     newphew  .  Colon polyps Daughter      Review of Systems  Constitutional: Negative for fever, chills, weight loss and malaise/fatigue.  HENT: Positive for hearing loss. Negative for congestion, nosebleeds, sore throat and tinnitus.   Eyes: Negative for blurred vision, double vision, pain and discharge.  Respiratory: Negative for cough, hemoptysis, sputum production, shortness of breath and wheezing.   Cardiovascular: Negative for chest pain, palpitations, claudication, leg swelling and PND.  Gastrointestinal:  Negative for heartburn, abdominal pain, diarrhea, constipation, blood in stool and melena. ALTERED TASTE Genitourinary: Negative for dysuria, urgency, frequency and hematuria.  Musculoskeletal: Positive for joint pain. Negative for myalgias and falls.  Skin: EGFR therapy related changes Neurological: Positive for weakness. Negative for dizziness, tingling, tremors, sensory change, speech change, focal weakness, seizures, loss of consciousness and headaches.  Endo/Heme/Allergies: Does not bruise/bleed easily.  Psychiatric/Behavioral: Negative for depression, suicidal ideas, memory loss and substance abuse. The patient is not nervous/anxious and does not have insomnia.   14 point review of systems was performed and is negative except as detailed under history of present illness and above    PHYSICAL EXAMINATION Vitals - 1 value per visit 7/82/9562  SYSTOLIC 130  DIASTOLIC 51  Pulse 70  Temperature 98.2  Respirations 20  Weight (lb) 148  Height   BMI 28.9  VISIT REPORT     ECOG PERFORMANCE STATUS: 1 - Symptomatic but completely ambulatory   Physical Exam  Constitutional: She is oriented to person, place, and time and well-developed, well-nourished, and in no distress. HENT:  Head: Normocephalic and atraumatic. R ear with small deformity from prior surgery Nose: Nose normal.  Mouth/Throat: Oropharynx is clear and moist. No oropharyngeal exudate.  Eyes: Conjunctivae and EOM are normal.  Pupils are equal, round, and reactive to light. Right eye exhibits no discharge. Left eye exhibits no discharge. No scleral icterus.  Neck: Normal range of motion. Neck supple. No tracheal deviation present. No thyromegaly present.  Cardiovascular: Normal rate, regular rhythm and normal heart sounds.  Exam reveals no gallop and no friction rub.   No murmur heard. Pulmonary/Chest: Effort normal and breath sounds normal. She has no wheezes. She has no rales.  Abdominal: Soft. Bowel sounds are normal. . There is no tenderness. There is no rebound and no guarding. ostomy site is intact with watery dark stool. Hernia noted. Incision sites all WNL Musculoskeletal: Normal range of motion. She exhibits no edema.  Lymphadenopathy:    She has no cervical adenopathy.  Neurological: She is alert and oriented to person, place, and time. She has normal reflexes. No cranial nerve deficit.  Skin: Skin is warm and dry. Mild vectibix facial rash. Mild associated nail changes/periungual splitting (grade 1)  Psychiatric: Mood, memory, affect and judgment normal.  Nursing note and vitals reviewed.   LABORATORY DATA: I have reviewed the data as listed. CBC    Component Value Date/Time   WBC 3.8* 06/29/2015 0858   RBC 3.46* 06/29/2015 0858   RBC 3.45* 11/24/2008 1500   HGB 11.0* 06/29/2015 0858   HCT 32.7* 06/29/2015 0858   PLT 191 06/29/2015 0858   MCV 94.5 06/29/2015 0858   MCH 31.8 06/29/2015 0858   MCHC 33.6 06/29/2015 0858   RDW 12.9 06/29/2015 0858   LYMPHSABS 1.5 06/29/2015 0858   MONOABS 0.4 06/29/2015 0858   EOSABS 0.3 06/29/2015 0858   BASOSABS 0.0 06/29/2015 0858   CMP     Component Value  Date/Time   NA 141 06/29/2015 0858   K 3.8 06/29/2015 0858   CL 110 06/29/2015 0858   CO2 26 06/29/2015 0858   GLUCOSE 92 06/29/2015 0858   BUN 17 06/29/2015 0858   CREATININE 0.85 06/29/2015 0858   CREATININE 0.95* 12/07/2014 1512   CALCIUM 8.3* 06/29/2015 0858   PROT 6.7 06/29/2015 0858    ALBUMIN 3.5 06/29/2015 0858   AST 24 06/29/2015 0858   ALT 15 06/29/2015 0858   ALKPHOS 52 06/29/2015 0858   BILITOT 0.4 06/29/2015 0858   GFRNONAA >60 06/29/2015 0858   GFRNONAA 56* 12/07/2014 1512   GFRAA >60 06/29/2015 0858   GFRAA 64 12/07/2014 1512    Results for Weinman, SHANQUITA RONNING (MRN [id number]) as of 06/19/2015 08:57  Ref. Range 04/06/2015 12:26 04/20/2015 10:14 05/18/2015 10:15 06/01/2015 09:44 06/15/2015 11:04  CEA Latest Ref Range: 0.0-4.7 ng/mL 96.0 (H) 109.6 (H) 234.8 (H) 161.0 (H) 142.4 (H)    RADIOLOGY: I have reviewed the images detailed below and agree with the results:  Study Result     CLINICAL DATA: 82 year old female with history of colon cancer with metastatic disease to the liver. Currently undergoing chemotherapy. Followup study.  EXAM: CT CHEST, ABDOMEN, AND PELVIS WITH CONTRAST  TECHNIQUE: Multidetector CT imaging of the chest, abdomen and pelvis was performed following the standard protocol during bolus administration of intravenous contrast.  CONTRAST: 36m OMNIPAQUE IOHEXOL 300 MG/ML SOLN  COMPARISON: Multiple priors, most recently PET-CT 01/18/2015.  FINDINGS: CT CHEST FINDINGS  Mediastinum/Lymph Nodes: Heart size is mildly enlarged. There is no significant pericardial fluid, thickening or pericardial calcification. There is atherosclerosis of the thoracic aorta, the great vessels of the mediastinum and the coronary arteries, including calcified atherosclerotic plaque in the left anterior descending and left circumflex coronary arteries. Calcifications of the mitral annulus. No pathologically enlarged mediastinal or hilar lymph nodes. Mild circumferential thickening of the distal half of the esophagus. No discrete esophageal mass identified. No axillary lymphadenopathy.  Lungs/Pleura: A few scattered tiny sub cm subpleural nodules are noted, largest of which is a 3 mm nodule in the posterior aspect of the left lower lobe. These are  unchanged, presumably a benign subpleural lymph nodes. No other larger more suspicious appearing pulmonary nodules or masses are noted to suggest metastatic disease to the lungs. Mild scarring in the right lower lobe. No acute consolidative airspace disease. No pleural effusions.  Musculoskeletal/Soft Tissues: There are no aggressive appearing lytic or blastic lesions noted in the visualized portions of the skeleton.  CT ABDOMEN AND PELVIS FINDINGS  Hepatobiliary: Interval increase in size of a hypovascular heterogeneously enhancing lesion in segments 2 and 3 of the liver, which currently measures 3.1 x 2.0 x 2.3 cm (axial image 48 of series 7 and coronal image 21 of series 9). There is also a a 7 mm well-defined low-attenuation lesion in the central aspect of segment 7 of the liver, too small to definitively characterize, but similar to the prior study, likely tiny cysts. No new hepatic lesions are noted. No intra or extrahepatic biliary ductal dilatation. Status post cholecystectomy.  Pancreas: Fatty infiltration in the head of the pancreas, similar prior studies. No pancreatic mass. No pancreatic ductal dilatation. No pancreatic or peripancreatic fluid or inflammatory changes.  Spleen: Unremarkable.  Adrenals/Urinary Tract: In the anterior aspect of the lower pole of the left kidney there is a well-circumscribed 1.1 x 1.4 cm lesion (image 33 of series 2) which measures 62 HU on precontrast images, 63 HU on arterial phase images,  79 HU on portal venous phase images, and 68 HU on delayed images. This lesion is only slightly larger than prior study 07/13/2014, at which point this measured 12 x 9 mm. 1.8 cm low-attenuation lesion in the interpolar region of the right kidney is compatible with a simple cyst. Sub cm low-attenuation lesion in the interpolar region of the left kidney is too small to definitively characterize, but is likely a cyst. No hydroureteronephrosis or  perinephric stranding. Urinary bladder is obscured by beam hardening artifact from the patient's left hip arthroplasty. Bilateral adrenal glands are normal in appearance.  Stomach/Bowel: Normal appearance of the stomach. No pathologic dilatation of small bowel or remaining colon. Status post right hemicolectomy with right lower quadrant ileostomy. Laxity of the lower right anterior abdominal wall. Small parastomal hernia containing several loops of small bowel. Notably, although the remaining portions of transverse, descending, sigmoid colon and rectum are decompressed, in the left lower quadrant (image 85 of series 7) there is a new soft tissue mass which appears to be intimately associated with the colon at the junction of descending colon and sigmoid colon, which measures approximately 2.9 x 4.1 x 4.5 cm, concerning for new colonic neoplasm. The possibility of a peritoneal implant in this region is not entirely excluded.  Vascular/Lymphatic: Atherosclerosis throughout the abdominal and pelvic vasculature, without evidence of aneurysm or dissection. No lymphadenopathy noted in the abdomen or pelvis.  Reproductive: Status post hysterectomy. Ovaries are not confidently identified may be surgically absent or atrophic (much of the pelvis is obscured by beam hardening artifact from the patient's left hip prosthesis).  Other: No significant volume of ascites. No pneumoperitoneum.  Musculoskeletal: Status post left total hip arthroplasty. There are no aggressive appearing lytic or blastic lesions noted in the visualized portions of the skeleton.  IMPRESSION: 1. Progression of disease as evidenced by enlargement of the metastatic lesion in segments 2/3 of the liver. 2. In addition, there has been interval development of a 2.9 x 4.1 x 4.5 cm mass which appears intimately associated with the remaining portion of the colon at the junction of the descending colon and sigmoid colon.  This appears to arise from this portion of the colon and is favored to represent a new colonic neoplasm. The possibility of a solitary peritoneal implant in this region is not entirely excluded. This could be further evaluated with colonoscopy if clinically appropriate. 3. No signs of metastatic disease to the thorax. 4. 1.1 x 1.4 cm indeterminate lesion in the lower pole of the left kidney. This is favored to represent a proteinaceous cyst with apparent pseudoenhancement on today's examination secondary to the small size of the lesion. However, this lesion was mildly complex on prior MRI of the abdomen 12/08/2014. Strictly speaking, the possibility of a small slow-growing cystic neoplasm is not entirely excluded. Close attention on followup studies is recommended. 5. Circumferential thickening of the distal half of the esophagus. This may simply reflect esophagitis, however, if there is any clinical concern for Barrett's metaplasia or esophageal neoplasia, further evaluation with endoscopy should be considered. 6. Mild cardiomegaly. 7. Atherosclerosis, including 2 vessel coronary artery disease. 8. Additional incidental findings, as above.   Electronically Signed  By: Trudie Reed M.D.  On: 04/12/2015 15:18    ASSESSMENT and THERAPY PLAN:  Colon carcinoma with permanent ostomy Diversion Colitis Achalasia Iron deficiency, anemia secondary to GI related blood loss Hemeoccult positive stools Rising CEA with CT imaging on 07/13/2014 1.3 cm mass in L hepatic lobe Subcapsular mass in anterior  pole of L kidney suspicious for RCC Biopsy of L liver lesion and MWA Left liver lesion 09/01/2014 Taste Alteration KRAS WT XELODA therapy, week on, week off started 01/2015 Progressive disease Difficulty discussing end of life decision making [facility] admission for diarrhea ? Vectibix induced Hypomagnesium  We again reviewed side effects of the multiple therapy options  for stage IV CRC. She would not do well with FOLFOX, FOLFIRI, I also do not believe she would tolerate single agent irinotecan.  I again reviewed side effects of vectibix including diarrhea and advised her that there are no dose modifications for diarrhea. I did dose adjust her however. She for now seems to be doing ok. I have emphasized the importance of notifying us or coming to the ED if she cannot stop her diarrhea with immodium.  We discussed magnesium and the importance of normal magnesium levels. She does not like to take magnesium. I have advised her to take one twice daily. She will receive IV magnesium today. She will return next week for repeat magnesium level.   We discussed end of life issues again, she remains resistant. I unfortunately do not think she will ever come to terms with end of life issues or DNR status.  RTC 2 weeks, labs and therapy. CEA is pending. Will call with results when available.   All questions were answered. The patient knows to call the clinic with any problems, questions or concerns. We can certainly see the patient much sooner if necessary.   This document was electronically signed.   This document serves as a record of services personally performed by Ancil Linsey, MD. It was created on her behalf by Toni Amend, a trained medical scribe. The creation of this record is based on the scribe's personal observations and the provider's statements to them. This document has been checked and approved by the attending provider.  I have reviewed the above documentation for accuracy and completeness, and I agree with the above.  Kelby Fam. Whitney Muse, MD

## 2015-06-29 NOTE — Patient Instructions (Addendum)
[place] at [facility] Discharge Instructions  RECOMMENDATIONS MADE BY THE CONSULTANT AND ANY TEST RESULTS WILL BE SENT TO YOUR REFERRING PHYSICIAN.  You need to take your magnesium twice a day. It is important to keep your magnesium levels up You must let us know or go to the ED if you develop watery stool that cannot be stopped with immodium You have a return appointment for physical exam, labs and treatment in 2 weeks You have an appointment for a magnesium level in one week   Thank you for choosing [place] at [facility] to provide your oncology and hematology care.  To afford each patient quality time with our provider, please arrive at least 15 minutes before your scheduled appointment time.   Beginning January 23rd 2017 lab work for the [name] will be done in the  Main lab at [facility] on 1st floor. If you have a lab appointment with the [place] please come in thru the  Main Entrance and check in at the main information desk  You need to re-schedule your appointment should you arrive 10 or more minutes late.  We strive to give you quality time with our providers, and arriving late affects you and other patients whose appointments are after yours.  Also, if you no show three or more times for appointments you may be dismissed from the clinic at the providers discretion.     Again, thank you for choosing [facility].  Our hope is that these requests will decrease the amount of time that you wait before being seen by our physicians.       _____________________________________________________________  Should you have questions after your visit to [facility], please contact our office at [phone] between the hours of 8:30 a.m. and 4:30 p.m.  Voicemails left after 4:30 p.m. will not be returned until the following business day.  For prescription refill requests, have your pharmacy contact our  office.         Resources For Cancer Patients and their Caregivers ? American Cancer Society: Can assist with transportation, wigs, general needs, runs Look Good Feel Better.        ([phone] ? Cancer Care: Provides financial assistance, online support groups, medication/co-pay assistance.  1-800-813-HOPE 225-589-2487) ? [place] [facility]ists [place] cancer patients and their families through emotional , educational and financial support.  838-760-2052 ? [place] DSS Where to apply for food stamps, Medicaid and utility assistance. 816-662-1336 ? RCATS: Transportation to medical appointments. 260-268-7424 ? Social Security Administration: May apply for disability if have a Stage IV cancer. ([phone] 759-1573 254-126-2846 ? [employer], Disability and Transit Services: Assists with nutrition, care and transit needs. [place] Support Programs: @10RELATIVEDAYS @ > Cancer Support Group  2nd Tuesday of the month 1pm-2pm, [name]  > Creative Journey  3rd Tuesday of the month 1130am-1pm, [name]  > Look Good Feel Better  1st Wednesday of the month 10am-12 noon, [name] (Call [place] to register [phone])

## 2015-06-29 NOTE — Patient Instructions (Signed)
[facility] Discharge Instructions for Patients Receiving Chemotherapy   Beginning January 23rd 2017 lab work for the [facility] will be done in the  Main lab at [facility] on 1st floor. If you have a lab appointment with the [place] please come in thru the  Main Entrance and check in at the main information desk   Today you received the following chemotherapy agents:  Vecitibix  If you develop nausea and vomiting, or diarrhea that is not controlled by your medication, call the clinic.  The clinic phone number is [phone]. Office hours are Monday-Friday 8:30am-5:00pm.  BELOW ARE SYMPTOMS THAT SHOULD BE REPORTED IMMEDIATELY:  *FEVER GREATER THAN 101.0 F  *CHILLS WITH OR WITHOUT FEVER  NAUSEA AND VOMITING THAT IS NOT CONTROLLED WITH YOUR NAUSEA MEDICATION  *UNUSUAL SHORTNESS OF BREATH  *UNUSUAL BRUISING OR BLEEDING  TENDERNESS IN MOUTH AND THROAT WITH OR WITHOUT PRESENCE OF ULCERS  *URINARY PROBLEMS  *BOWEL PROBLEMS  UNUSUAL RASH Items with * indicate a potential emergency and should be followed up as soon as possible. If you have an emergency after office hours please contact your primary care physician or go to the nearest emergency department.  Please call the clinic during office hours if you have any questions or concerns.   You may also contact the Patient Navigator at [phone] should you have any questions or need assistance in obtaining follow up care.      Resources For Cancer Patients and their Caregivers ? American Cancer Society: Can assist with transportation, wigs, general needs, runs Look Good Feel Better.        386-729-7793 ? Cancer Care: Provides financial assistance, online support groups, medication/co-pay assistance.  1-800-813-HOPE (240) 562-5544) ? [place] [facility]ists [place] cancer patients and their families through emotional , educational and financial support.   838-871-9283 ? [place] DSS Where to apply for food stamps, Medicaid and utility assistance. 980-267-6505 ? RCATS: Transportation to medical appointments. (680)330-5911 ? Social Security Administration: May apply for disability if have a Stage IV cancer. 520-166-3196 (425)363-3562 ? [employer], Disability and Transit Services: Assists with nutrition, care and transit needs. [phone] [phone]

## 2015-06-30 LAB — CEA: CEA: 138.2 ng/mL — AB (ref 0.0–4.7)

## 2015-07-02 ENCOUNTER — Other Ambulatory Visit (HOSPITAL_COMMUNITY): Payer: Self-pay | Admitting: Oncology

## 2015-07-02 ENCOUNTER — Telehealth (HOSPITAL_COMMUNITY): Payer: Self-pay | Admitting: *Deleted

## 2015-07-02 DIAGNOSIS — C787 Secondary malignant neoplasm of liver and intrahepatic bile duct: Secondary | ICD-10-CM | POA: Diagnosis not present

## 2015-07-02 DIAGNOSIS — I10 Essential (primary) hypertension: Secondary | ICD-10-CM | POA: Diagnosis not present

## 2015-07-02 DIAGNOSIS — I471 Supraventricular tachycardia: Secondary | ICD-10-CM | POA: Diagnosis not present

## 2015-07-02 DIAGNOSIS — I5030 Unspecified diastolic (congestive) heart failure: Secondary | ICD-10-CM | POA: Diagnosis not present

## 2015-07-02 DIAGNOSIS — Z433 Encounter for attention to colostomy: Secondary | ICD-10-CM | POA: Diagnosis not present

## 2015-07-02 DIAGNOSIS — C189 Malignant neoplasm of colon, unspecified: Secondary | ICD-10-CM | POA: Diagnosis not present

## 2015-07-02 MED ORDER — MAGNESIUM OXIDE 400 (241.3 MG) MG PO TABS: 400.0000 mg | ORAL_TABLET | Freq: Every day | ORAL | Status: DC

## 2015-07-03 DIAGNOSIS — C787 Secondary malignant neoplasm of liver and intrahepatic bile duct: Secondary | ICD-10-CM | POA: Diagnosis not present

## 2015-07-03 DIAGNOSIS — I471 Supraventricular tachycardia: Secondary | ICD-10-CM | POA: Diagnosis not present

## 2015-07-03 DIAGNOSIS — C189 Malignant neoplasm of colon, unspecified: Secondary | ICD-10-CM | POA: Diagnosis not present

## 2015-07-03 DIAGNOSIS — Z433 Encounter for attention to colostomy: Secondary | ICD-10-CM | POA: Diagnosis not present

## 2015-07-03 DIAGNOSIS — I5030 Unspecified diastolic (congestive) heart failure: Secondary | ICD-10-CM | POA: Diagnosis not present

## 2015-07-03 DIAGNOSIS — I10 Essential (primary) hypertension: Secondary | ICD-10-CM | POA: Diagnosis not present

## 2015-07-04 ENCOUNTER — Other Ambulatory Visit (HOSPITAL_COMMUNITY): Payer: Medicare Other

## 2015-07-05 ENCOUNTER — Other Ambulatory Visit (HOSPITAL_COMMUNITY): Payer: Self-pay | Admitting: *Deleted

## 2015-07-05 ENCOUNTER — Encounter (HOSPITAL_COMMUNITY): Payer: Medicare Other

## 2015-07-05 DIAGNOSIS — R197 Diarrhea, unspecified: Secondary | ICD-10-CM

## 2015-07-05 DIAGNOSIS — C787 Secondary malignant neoplasm of liver and intrahepatic bile duct: Principal | ICD-10-CM

## 2015-07-05 DIAGNOSIS — D509 Iron deficiency anemia, unspecified: Secondary | ICD-10-CM | POA: Diagnosis not present

## 2015-07-05 DIAGNOSIS — C189 Malignant neoplasm of colon, unspecified: Secondary | ICD-10-CM | POA: Diagnosis not present

## 2015-07-05 LAB — MAGNESIUM: MAGNESIUM: 1.1 mg/dL — AB (ref 1.7–2.4)

## 2015-07-05 MED ORDER — DIPHENOXYLATE-ATROPINE 2.5-0.025 MG PO TABS: ORAL_TABLET | ORAL | Status: DC

## 2015-07-07 ENCOUNTER — Other Ambulatory Visit (HOSPITAL_COMMUNITY): Payer: Self-pay | Admitting: *Deleted

## 2015-07-10 ENCOUNTER — Encounter (HOSPITAL_BASED_OUTPATIENT_CLINIC_OR_DEPARTMENT_OTHER): Payer: Medicare Other

## 2015-07-10 MED ORDER — SODIUM CHLORIDE 0.9 % IV SOLN: 4.0000 g | Freq: Once | INTRAVENOUS | Status: DC

## 2015-07-10 MED ORDER — MAGNESIUM SULFATE 4 GM/100ML IV SOLN
4.0000 g | Freq: Once | INTRAVENOUS | Status: AC
  Administered 2015-07-10: 4 g via INTRAVENOUS
  Filled 2015-07-10: qty 100

## 2015-07-10 MED ORDER — SODIUM CHLORIDE 0.9 % IV SOLN
INTRAVENOUS | Status: DC
  Administered 2015-07-10: 12:00:00 via INTRAVENOUS

## 2015-07-10 MED ORDER — DOXYCYCLINE HYCLATE 100 MG PO TABS: 100.0000 mg | ORAL_TABLET | Freq: Two times a day (BID) | ORAL | Status: DC

## 2015-07-10 NOTE — Patient Instructions (Signed)
[place] at [facility] Discharge Instructions  RECOMMENDATIONS MADE BY THE CONSULTANT AND ANY TEST RESULTS WILL BE SENT TO YOUR REFERRING PHYSICIAN.  Magnesium infusion today. Doxycycline prescription sent to your pharmacy. Return as scheduled on Friday. Call the clinic with any questions or concerns.   Thank you for choosing [place] at [facility] to provide your oncology and hematology care.  To afford each patient quality time with our provider, please arrive at least 15 minutes before your scheduled appointment time.   Beginning January 23rd 2017 lab work for the [name] will be done in the  Main lab at [facility] on 1st floor. If you have a lab appointment with the [facility] please come in thru the  Main Entrance and check in at the main information desk  You need to re-schedule your appointment should you arrive 10 or more minutes late.  We strive to give you quality time with our providers, and arriving late affects you and other patients whose appointments are after yours.  Also, if you no show three or more times for appointments you may be dismissed from the clinic at the providers discretion.     Again, thank you for choosing [facility].  Our hope is that these requests will decrease the amount of time that you wait before being seen by our physicians.       _____________________________________________________________  Should you have questions after your visit to [facility], please contact our office at [phone] between the hours of 8:30 a.m. and 4:30 p.m.  Voicemails left after 4:30 p.m. will not be returned until the following business day.  For prescription refill requests, have your pharmacy contact our office.         Resources For Cancer Patients and their Caregivers ? American Cancer Society: Can assist with transportation, wigs, general needs, runs Look Good Feel Better.         [phone] ? Cancer Care: Provides financial assistance, online support groups, medication/co-pay assistance.  1-800-813-HOPE 715-443-6687) ? [place] Assists [place] cancer patients and their families through emotional , educational and financial support.  959-237-1674 ? [place] DSS Where to apply for food stamps, Medicaid and utility assistance. (316)063-9838 ? RCATS: Transportation to medical appointments. 8041321763 ? Social Security Administration: May apply for disability if have a Stage IV cancer. 438-589-1083 [phone] ? [employer], Disability and Transit Services: Assists with nutrition, care and transit needs. [place] Support Programs: @10RELATIVEDAYS @ > Cancer Support Group  2nd Tuesday of the month 1pm-2pm, [name]  > Creative Journey  3rd Tuesday of the month 1130am-1pm, [name]  > Look Good Feel Better  1st Wednesday of the month 10am-12 noon, [name] (Call [place] to register [phone])

## 2015-07-13 ENCOUNTER — Other Ambulatory Visit: Payer: Self-pay

## 2015-07-13 ENCOUNTER — Encounter (HOSPITAL_BASED_OUTPATIENT_CLINIC_OR_DEPARTMENT_OTHER): Payer: Medicare Other | Admitting: Oncology

## 2015-07-13 ENCOUNTER — Inpatient Hospital Stay (HOSPITAL_COMMUNITY): Payer: Medicare Other

## 2015-07-13 ENCOUNTER — Ambulatory Visit (HOSPITAL_COMMUNITY): Payer: Medicare Other | Admitting: Hematology & Oncology

## 2015-07-13 ENCOUNTER — Emergency Department (HOSPITAL_COMMUNITY)
Admission: EM | Admit: 2015-07-13 | Discharge: 2015-07-13 | Disposition: A | Discharge status: Home / Self Care | Payer: Medicare Other | Attending: Emergency Medicine | Admitting: Emergency Medicine

## 2015-07-13 ENCOUNTER — Encounter (HOSPITAL_COMMUNITY): Payer: Medicare Other | Attending: Hematology and Oncology

## 2015-07-13 VITALS — BP 149/57 | HR 66 | Temp 98.2°F | Resp 18 | Wt 148.2 lb

## 2015-07-13 DIAGNOSIS — Z79899 Other long term (current) drug therapy: Secondary | ICD-10-CM | POA: Diagnosis not present

## 2015-07-13 DIAGNOSIS — I447 Left bundle-branch block, unspecified: Secondary | ICD-10-CM

## 2015-07-13 DIAGNOSIS — R000 Tachycardia, unspecified: Secondary | ICD-10-CM

## 2015-07-13 DIAGNOSIS — C787 Secondary malignant neoplasm of liver and intrahepatic bile duct: Secondary | ICD-10-CM

## 2015-07-13 DIAGNOSIS — I952 Hypotension due to drugs: Secondary | ICD-10-CM | POA: Diagnosis not present

## 2015-07-13 DIAGNOSIS — Z87891 Personal history of nicotine dependence: Secondary | ICD-10-CM | POA: Insufficient documentation

## 2015-07-13 DIAGNOSIS — Z7982 Long term (current) use of aspirin: Secondary | ICD-10-CM | POA: Insufficient documentation

## 2015-07-13 DIAGNOSIS — I471 Supraventricular tachycardia: Secondary | ICD-10-CM

## 2015-07-13 DIAGNOSIS — M1990 Unspecified osteoarthritis, unspecified site: Secondary | ICD-10-CM | POA: Diagnosis not present

## 2015-07-13 DIAGNOSIS — N183 Chronic kidney disease, stage 3 (moderate): Secondary | ICD-10-CM | POA: Insufficient documentation

## 2015-07-13 DIAGNOSIS — I130 Hypertensive heart and chronic kidney disease with heart failure and stage 1 through stage 4 chronic kidney disease, or unspecified chronic kidney disease: Secondary | ICD-10-CM | POA: Diagnosis not present

## 2015-07-13 DIAGNOSIS — C189 Malignant neoplasm of colon, unspecified: Secondary | ICD-10-CM

## 2015-07-13 DIAGNOSIS — I10 Essential (primary) hypertension: Secondary | ICD-10-CM | POA: Diagnosis not present

## 2015-07-13 DIAGNOSIS — C20 Malignant neoplasm of rectum: Secondary | ICD-10-CM

## 2015-07-13 DIAGNOSIS — E039 Hypothyroidism, unspecified: Secondary | ICD-10-CM | POA: Diagnosis not present

## 2015-07-13 DIAGNOSIS — J45909 Unspecified asthma, uncomplicated: Secondary | ICD-10-CM | POA: Diagnosis not present

## 2015-07-13 DIAGNOSIS — E785 Hyperlipidemia, unspecified: Secondary | ICD-10-CM | POA: Diagnosis not present

## 2015-07-13 DIAGNOSIS — R002 Palpitations: Secondary | ICD-10-CM

## 2015-07-13 DIAGNOSIS — I5032 Chronic diastolic (congestive) heart failure: Secondary | ICD-10-CM | POA: Insufficient documentation

## 2015-07-13 DIAGNOSIS — Z8551 Personal history of malignant neoplasm of bladder: Secondary | ICD-10-CM | POA: Insufficient documentation

## 2015-07-13 DIAGNOSIS — D509 Iron deficiency anemia, unspecified: Secondary | ICD-10-CM | POA: Diagnosis not present

## 2015-07-13 DIAGNOSIS — E119 Type 2 diabetes mellitus without complications: Secondary | ICD-10-CM | POA: Diagnosis not present

## 2015-07-13 DIAGNOSIS — L270 Generalized skin eruption due to drugs and medicaments taken internally: Secondary | ICD-10-CM

## 2015-07-13 DIAGNOSIS — R7989 Other specified abnormal findings of blood chemistry: Secondary | ICD-10-CM | POA: Diagnosis present

## 2015-07-13 LAB — CBC WITH DIFFERENTIAL/PLATELET
BASOS ABS: 0 10*3/uL (ref 0.0–0.1)
BASOS PCT: 1 %
EOS PCT: 6 %
Eosinophils Absolute: 0.2 10*3/uL (ref 0.0–0.7)
HEMATOCRIT: 31.3 % — AB (ref 36.0–46.0)
Hemoglobin: 10.3 g/dL — ABNORMAL LOW (ref 12.0–15.0)
LYMPHS PCT: 25 %
Lymphs Abs: 0.9 10*3/uL (ref 0.7–4.0)
MCH: 30.9 pg (ref 26.0–34.0)
MCHC: 32.9 g/dL (ref 30.0–36.0)
MCV: 94 fL (ref 78.0–100.0)
MONO ABS: 0.4 10*3/uL (ref 0.1–1.0)
MONOS PCT: 12 %
NEUTROS ABS: 2.1 10*3/uL (ref 1.7–7.7)
Neutrophils Relative %: 58 %
PLATELETS: 184 10*3/uL (ref 150–400)
RBC: 3.33 MIL/uL — ABNORMAL LOW (ref 3.87–5.11)
RDW: 12.4 % (ref 11.5–15.5)
WBC: 3.6 10*3/uL — ABNORMAL LOW (ref 4.0–10.5)

## 2015-07-13 LAB — MAGNESIUM: MAGNESIUM: 1.1 mg/dL — AB (ref 1.7–2.4)

## 2015-07-13 LAB — COMPREHENSIVE METABOLIC PANEL
ALT: 14 U/L (ref 14–54)
ANION GAP: 6 (ref 5–15)
AST: 21 U/L (ref 15–41)
Albumin: 3.5 g/dL (ref 3.5–5.0)
Alkaline Phosphatase: 55 U/L (ref 38–126)
BILIRUBIN TOTAL: 0.4 mg/dL (ref 0.3–1.2)
BUN: 20 mg/dL (ref 6–20)
CHLORIDE: 107 mmol/L (ref 101–111)
CO2: 28 mmol/L (ref 22–32)
Calcium: 8.5 mg/dL — ABNORMAL LOW (ref 8.9–10.3)
Creatinine, Ser: 0.85 mg/dL (ref 0.44–1.00)
GFR calc Af Amer: >60 mL/min (ref >60)
Glucose, Bld: 89 mg/dL (ref 65–99)
POTASSIUM: 4 mmol/L (ref 3.5–5.1)
Sodium: 141 mmol/L (ref 135–145)
TOTAL PROTEIN: 6.6 g/dL (ref 6.5–8.1)

## 2015-07-13 LAB — TSH: TSH: 2.202 u[IU]/mL (ref 0.350–4.500)

## 2015-07-13 LAB — TROPONIN I: Troponin I: 0.05 ng/mL — ABNORMAL HIGH (ref <0.031)

## 2015-07-13 MED ORDER — SODIUM CHLORIDE 0.9 % IV SOLN
INTRAVENOUS | Status: DC
Start: 2015-07-13 — End: 2015-07-13
  Administered 2015-07-13: 12:00:00 via INTRAVENOUS

## 2015-07-13 MED ORDER — CARVEDILOL 12.5 MG PO TABS
6.2500 mg | ORAL_TABLET | Freq: Once | ORAL | Status: AC
  Administered 2015-07-13: 6.25 mg via ORAL
  Filled 2015-07-13: qty 1

## 2015-07-13 MED ORDER — MAGNESIUM SULFATE 4 GM/100ML IV SOLN
4.0000 g | Freq: Once | INTRAVENOUS | Status: AC
  Administered 2015-07-13: 4 g via INTRAVENOUS
  Filled 2015-07-13: qty 100

## 2015-07-13 MED ORDER — MAGNESIUM OXIDE 400 (241.3 MG) MG PO TABS
400.0000 mg | ORAL_TABLET | Freq: Once | ORAL | Status: AC
  Administered 2015-07-13: 400 mg via ORAL
  Filled 2015-07-13: qty 1

## 2015-07-13 MED ORDER — HYDROCODONE-ACETAMINOPHEN 7.5-325 MG PO TABS: 1.0000 | ORAL_TABLET | ORAL | Status: DC | PRN

## 2015-07-13 NOTE — ED Provider Notes (Signed)
CSN: [id number]     Arrival date & time 07/13/15  1308 History   First MD Initiated Contact with Patient 07/13/15 1319     Chief Complaint  Patient presents with  . Abnormal Lab     HPI Pt was seen at 1325. Per pt and her family, c/o gradual onset and persistence of multiple intermittent episodes of "palpitations" for the past 3 days. Pt states she has a long hx of palpitations due to low magnesium as s/e of her chemotherapy. States 3 days ago the "palpitations lasted longer than usual" and have been coming more frequently for the past 3 days. Pt was evaluated at the [facility], noted to have magnesium level of "1.1," had IV magnesium 4gm started, then was sent to the ED for further evaluation. Denies CP/SOB, no abd pain, no back pain, no N/V/D.    Past Medical History  Diagnosis Date  . Chronic diastolic CHF (congestive heart failure) (HCC)     a. nl EF by echo 05/2009.  Marland Kitchen DVT of deep femoral vein ([place])   . History of PSVT (paroxysmal supraventricular tachycardia)   . Hypertension   . Abdominal adhesions   . Ileostomy present ([place])   . Colon polyp   . Chest pain     a. 2002 Cath: nl cors;  b. 2009 aden mv: nl;  c. 05/2009 Echo: nl. d. 2014: normal nuclear stress test, EF 69%.  . Wears dentures   . Breast lump   . Hyperlipidemia   . Thyroid disease   . Anemia   . History of blood clots   . Hernia   . Blood transfusion   . Hearing loss   . Arthritis   . GERD (gastroesophageal reflux disease)   . Cancer ([place])     bladder  . Colon cancer ([place])     a. recurrence 2016 with liver mets -  percutaneous liver biopsy and thermal ablation of a liver metastasis by interventional radiology..  . Iron deficiency anemia due to chronic blood loss 07/06/2013  . UTI (urinary tract infection)   . Obstruction of intestine or colon ([place]) 06/2014  . Diabetes mellitus without complication ([place]) 06/12/75    borderline type II; takes no medication for it  . Vision abnormalities 2016    not going  blind but having retina problems; might lose ability to see faces  . LBBB (left bundle branch block)   . Asthma     hx of  . Depression   . CKD (chronic kidney disease), stage III     pt. states decreased kidney function  . Headache   . Fibromyalgia   . Achalasia     a. s/p botox injection for achalasia.  . Sinus bradycardia     a. HR 30s in 08/2014 requiring holding of digoxin.  . Family history of colon cancer   . Family history of bladder cancer   . Family history of breast cancer    Past Surgical History  Procedure Laterality Date  . Cardiac catheterization  12/10/2000    THE LEFT VENTRICLE IS MILDY DILATED. THERE IS MILD TO MODERATE DIFFUSE HYPOKINESIS WITH EF 35%  . Colon cancer surgery    . Ileostomy    . Esophagogastroduodenoscopy  02/13/2011    Procedure: ESOPHAGOGASTRODUODENOSCOPY (EGD);  Surgeon: Rogene Houston, MD;  Location: [facility] ENDO SUITE;  Service: Endoscopy;  Laterality: N/A;  1030  . Colonoscopy  09/26/2011    Procedure: COLONOSCOPY;  Surgeon: Rogene Houston, MD;  Location: [facility] ENDO SUITE;  Service: Endoscopy;  Laterality: N/A;  215  . Cataract extraction w/phaco  11/13/2011    Procedure: CATARACT EXTRACTION PHACO AND INTRAOCULAR LENS PLACEMENT (IOC);  Surgeon: Tonny Branch, MD;  Location: [facility] ORS;  Service: Ophthalmology;  Laterality: Right;  CDE 12.26  . Cataract extraction w/phaco  12/08/2011    Procedure: CATARACT EXTRACTION PHACO AND INTRAOCULAR LENS PLACEMENT (IOC);  Surgeon: Tonny Branch, MD;  Location: [facility] ORS;  Service: Ophthalmology;  Laterality: Left;  CDE 15.11  . Esophagogastroduodenoscopy N/A 09/01/2013    Procedure: ESOPHAGOGASTRODUODENOSCOPY (EGD);  Surgeon: Rogene Houston, MD;  Location: [facility];  Service: Endoscopy;  Laterality: N/A;  830  . Botox injection N/A 09/01/2013    Procedure: BOTOX INJECTION;  Surgeon: Rogene Houston, MD;  Location: [facility];  Service: Endoscopy;  Laterality: N/A;  . Abdominal hysterectomy    . Cholecystectomy    .  Joint replacement Left 2008  . Tonsillectomy    . Colon surgery    . Eye surgery  2014    catarac surgery  . Appendectomy    . Ovarian cystectomy 1955    . Ileostomy    . Coronary angioplasty    . Rotator cuff repair    . Breast surgery      right breast biopsy  . Liver biopsy and ablation  09/01/14   Family History  Problem Relation Age of Onset  . Stroke    . Hypertension    . Heart disease Mother   . Stroke Mother     deceased  . Arthritis Mother   . Bladder Cancer Mother     dx in her 86s  . Colon cancer Mother   . Arthritis Father   . Heart disease Father     decesaed  . Leukemia Father   . Stroke Sister     alive/debilitated  . Hypertension Sister   . Other Sister     paralysis  . Heart disease Brother     bypass surgery  . Arthritis Brother   . Colon cancer Brother   . Bladder Cancer Brother   . Stroke Sister     alive/debilitated  . Diabetes Sister   . Other Brother     stomach problems  . Other Brother     bladder  . Colon cancer Paternal Aunt   . Bladder Cancer Other     dx twice in her 30s-40s  . Breast cancer Other     great niece dx in her early 55s  . Prostate cancer Other     newphew  . Colon polyps Daughter    Social History  Substance Use Topics  . Smoking status: Former Smoker -- 0.25 packs/day for 13 years    Types: Cigarettes    Quit date: 02/11/1963  . Smokeless tobacco: Never Used  . Alcohol Use: No    Review of Systems ROS: Statement: All systems negative except as marked or noted in the HPI; Constitutional: Negative for fever and chills. ; ; Eyes: Negative for eye pain, redness and discharge. ; ; ENMT: Negative for ear pain, hoarseness, nasal congestion, sinus pressure and sore throat. ; ; Cardiovascular: +palpitations. Negative for chest pain, diaphoresis, dyspnea and peripheral edema. ; ; Respiratory: Negative for cough, wheezing and stridor. ; ; Gastrointestinal: Negative for nausea, vomiting, diarrhea, abdominal pain, blood  in stool, hematemesis, jaundice and rectal bleeding. . ; ; Genitourinary: Negative for dysuria, flank pain and hematuria. ; ; Musculoskeletal: Negative for  back pain and neck pain. Negative for swelling and trauma.; ; Skin: Negative for pruritus, rash, abrasions, blisters, bruising and skin lesion.; ; Neuro: Negative for headache, lightheadedness and neck stiffness. Negative for weakness, altered level of consciousness, altered mental status, extremity weakness, paresthesias, involuntary movement, seizure and syncope.      Allergies  Bactrim; Codeine; Demerol; Lidocaine hcl; Morphine and related; Dilaudid; Nitrofurantoin monohyd macro; Lisinopril; and Ramipril  Home Medications   Prior to Admission medications   Medication Sig Start Date End Date Taking? Authorizing Provider  ALPRAZolam Prudy Feeler) 0.5 MG tablet Take 0.5 mg by mouth at bedtime.    Yes Historical Provider, MD  aspirin EC 325 MG tablet Take 325 mg by mouth daily.   Yes Historical Provider, MD  Calcium Carbonate (CALTRATE 600 PO) Take 1 tablet by mouth daily.    Yes Historical Provider, MD  carvedilol (COREG) 12.5 MG tablet Take 1 tablet (12.5 mg total) by mouth 2 (two) times daily with a meal. 12/06/14  Yes Dayna N Dunn, PA-C  diphenoxylate-atropine (LOMOTIL) 2.5-0.025 MG tablet Take 1-2 tablets four times a day as needed for loose stools. 07/05/15  Yes Ellouise Newer, PA-C  doxycycline (VIBRA-TABS) 100 MG tablet Take 1 tablet (100 mg total) by mouth 2 (two) times daily. 07/10/15  Yes Allene Pyo, MD  fluticasone (FLONASE) 50 MCG/ACT nasal spray Place 2 sprays into both nostrils daily.   Yes Historical Provider, MD  HYDROcodone-acetaminophen (NORCO) 7.5-325 MG tablet Take 1 tablet by mouth every 4 (four) hours as needed. 07/13/15  Yes Maurine Minister Kefalas, PA-C  hyoscyamine (LEVSIN SL) 0.125 MG SL tablet Place 1 tablet (0.125 mg total) under the tongue every 6 (six) hours as needed (lower abdominal pain). 09/11/14  Yes Malissa Hippo, MD   levothyroxine (SYNTHROID, LEVOTHROID) 75 MCG tablet Take 75 mcg by mouth daily.    Yes Historical Provider, MD  magnesium oxide (MAG-OX) 400 (241.3 Mg) MG tablet Take 1 tablet (400 mg total) by mouth daily. Patient taking differently: Take 400 mg by mouth 3 (three) times daily.  07/02/15  Yes Ellouise Newer, PA-C  Multiple Vitamins-Minerals (EQL GUMMY ADULT) CHEW Chew 2 tablets by mouth daily. Reported on 02/06/2015   Yes Historical Provider, MD  nystatin (MYCOSTATIN/NYSTOP) 100000 UNIT/GM POWD Apply 1 Bottle topically 2 (two) times daily. 03/07/15  Yes Allene Pyo, MD  nystatin-triamcinolone (MYCOLOG II) cream Apply 1 application topically 2 (two) times daily. 03/20/15  Yes Malissa Hippo, MD  Omega-3 Fatty Acids (FISH OIL) 1000 MG CAPS Take 1 capsule by mouth daily. Reported on 02/19/2015   Yes Historical Provider, MD  ondansetron (ZOFRAN ODT) 8 MG disintegrating tablet Take 1 tablet (8 mg total) by mouth every 8 (eight) hours as needed for nausea or vomiting. 05/22/15  Yes Ellouise Newer, PA-C  Panitumumab (VECTIBIX IV) Inject into the vein. To start 04/20/15. To be given every 2 weeks.   Yes Historical Provider, MD  pantoprazole (PROTONIX) 40 MG tablet Take 40 mg by mouth daily.    Yes Historical Provider, MD  Probiotic Product (PROBIOTIC & ACIDOPHILUS EX ST PO) Take 1 tablet by mouth daily.   Yes Historical Provider, MD  prochlorperazine (COMPAZINE) 10 MG tablet Take 1 tablet (10 mg total) by mouth every 6 (six) hours as needed for nausea or vomiting. 05/22/15  Yes Ellouise Newer, PA-C  ciprofloxacin (CIPRO) 250 MG tablet Take 1 tablet (250 mg total) by mouth 2 (two) times daily. Patient not taking: Reported on 07/13/2015  06/13/15   Erline Hau, MD  fentaNYL (DURAGESIC - DOSED MCG/HR) 25 MCG/HR patch Place 1 patch (25 mcg total) onto the skin every 3 (three) days. 06/29/15   Thomas S Kefalas, PA-C  NITROSTAT 0.4 MG SL tablet DISSOLVE ONE TABLET UNDER THE TONGUE EVERY 5 MINUTES AS  NEEDED FOR CHEST PAIN.  DO NOT EXCEED A TOTAL OF 3 DOSES IN 15 MINUTES 11/15/13   Darlin Coco, MD   BP 156/100 mmHg  Pulse 79  SpO2 100%  BP 151/60 mmHg  Pulse 64  Resp 12  SpO2 98%  Physical Exam  1330: Physical examination:  Nursing notes reviewed; Vital signs and O2 SAT reviewed;  Constitutional: Well developed, Well nourished, Well hydrated, In no acute distress; Head:  Normocephalic, atraumatic; Eyes: EOMI, PERRL, No scleral icterus; ENMT: Mouth and pharynx normal, Mucous membranes moist; Neck: Supple, Full range of motion, No lymphadenopathy; Cardiovascular: Regular rate and rhythm, No gallop; Respiratory: Breath sounds clear & equal bilaterally, No wheezes.  Speaking full sentences with ease, Normal respiratory effort/excursion; Chest: Nontender, Movement normal; Abdomen: Soft, Nontender, Nondistended, Normal bowel sounds; Genitourinary: No CVA tenderness; Extremities: Pulses normal, No tenderness, No edema, No calf edema or asymmetry.; Neuro: AA&Ox3, Major CN grossly intact.  Speech clear. No gross focal motor or sensory deficits in extremities.; Skin: Color normal, Warm, Dry.   ED Course  Procedures (including critical care time) Labs Review   Imaging Review  I have personally reviewed and evaluated these images and lab results as part of my medical decision-making.   EKG Interpretation   Date/Time:  Friday July 13 2015 13:18:38 EDT Ventricular Rate:  74 PR Interval:  198 QRS Duration: 134 QT Interval:  419 QTC Calculation: 465 R Axis:   22 Text Interpretation:  Sinus rhythm Atrial premature complex Left bundle  branch block Since last tracing of earlier today Normal sinus rhythm has  replaced Wide QRS rhythm Confirmed by [facility]  MD, Nunzio Cory [id number]) on  07/13/2015 1:26:16 PM      MDM  MDM Reviewed: previous chart, nursing note and vitals Reviewed previous: labs and ECG Interpretation: ECG and labs   Results for Karen [name], Karen [name] (MRN [id number]) as of  07/14/2015 21:00  Ref. Range 07/13/2015 11:41  Sodium Latest Ref Range: 135-145 mmol/L 141  Potassium Latest Ref Range: 3.5-5.1 mmol/L 4.0  Chloride Latest Ref Range: 101-111 mmol/L 107  CO2 Latest Ref Range: 22-32 mmol/L 28  BUN Latest Ref Range: 6-20 mg/dL 20  Creatinine Latest Ref Range: 0.44-1.00 mg/dL 0.85  Calcium Latest Ref Range: 8.9-10.3 mg/dL 8.5 (L)  EGFR (Non-African Amer.) Latest Ref Range: >60 mL/min >60  EGFR (African American) Latest Ref Range: >60 mL/min >60  Glucose Latest Ref Range: 65-99 mg/dL 89  Anion gap Latest Ref Range: 5-15  6  Magnesium Latest Ref Range: 1.7-2.4 mg/dL 1.1 (L)  Alkaline Phosphatase Latest Ref Range: 38-126 U/L 55  Albumin Latest Ref Range: 3.5-5.0 g/dL 3.5  AST Latest Ref Range: 15-41 U/L 21  ALT Latest Ref Range: 14-54 U/L 14  Total Protein Latest Ref Range: 6.5-8.1 g/dL 6.6  Total Bilirubin Latest Ref Range: 0.3-1.2 mg/dL 0.4  WBC Latest Ref Range: 4.0-10.5 K/uL 3.6 (L)  RBC Latest Ref Range: 3.87-5.11 MIL/uL 3.33 (L)  Hemoglobin Latest Ref Range: 12.0-15.0 g/dL 10.3 (L)  HCT Latest Ref Range: 36.0-46.0 % 31.3 (L)  MCV Latest Ref Range: 78.0-100.0 fL 94.0  MCH Latest Ref Range: 26.0-34.0 pg 30.9  MCHC Latest Ref Range: 30.0-36.0 g/dL 32.9  RDW  Latest Ref Range: 11.5-15.5 % 12.4  Platelets Latest Ref Range: 150-400 K/uL 184  Neutrophils Latest Units: % 58  Lymphocytes Latest Units: % 25  Monocytes Relative Latest Units: % 12  Eosinophil Latest Units: % 6  Basophil Latest Units: % 1  NEUT# Latest Ref Range: 1.7-7.7 K/uL 2.1  Lymphocyte # Latest Ref Range: 0.7-4.0 K/uL 0.9  Monocyte # Latest Ref Range: 0.1-1.0 K/uL 0.4  Eosinophils Absolute Latest Ref Range: 0.0-0.7 K/uL 0.2  Basophils Absolute Latest Ref Range: 0.0-0.1 K/uL 0.0     Results for orders placed or performed during the hospital encounter of 07/13/15  Troponin I  Result Value Ref Range   Troponin I 0.05 (H) <0.031 ng/mL  TSH  Result Value Ref Range   TSH 2.202 0.350 -  4.500 uIU/mL    1350:  T/C to Cards Dr. Harl Bowie, case discussed, including:  HPI, pertinent PM/SHx, VS/PE, dx testing, ED course and treatment:  Agreeable to consult.   1645:  Pt's troponin chronically elevated. IV magnesium is infused, she has also received a dose of PO magnesium while at the [facility]. Cards Dr. Harl Bowie has evaluated pt (consult written): states to have pt increase coreg to 18.'75mg'$  BID, increase magnesium per [place] instructions; no indication for admission at this time and pt can be d/c home with outpt f/u. Dx and testing d/w pt and family.  Questions answered.  Verb understanding, agreeable to d/c home with outpt f/u.   Francine Graven, DO 07/14/15 2101

## 2015-07-13 NOTE — Patient Instructions (Signed)
[place] at [facility] Discharge Instructions  RECOMMENDATIONS MADE BY THE CONSULTANT AND ANY TEST RESULTS WILL BE SENT TO YOUR REFERRING PHYSICIAN.  IV and PO magnesium today.    Thank you for choosing [place] at [facility] to provide your oncology and hematology care.  To afford each patient quality time with our provider, please arrive at least 15 minutes before your scheduled appointment time.   Beginning January 23rd 2017 lab work for the [name] will be done in the  Main lab at [facility] on 1st floor. If you have a lab appointment with the [facility] please come in thru the  Main Entrance and check in at the main information desk  You need to re-schedule your appointment should you arrive 10 or more minutes late.  We strive to give you quality time with our providers, and arriving late affects you and other patients whose appointments are after yours.  Also, if you no show three or more times for appointments you may be dismissed from the clinic at the providers discretion.     Again, thank you for choosing [facility].  Our hope is that these requests will decrease the amount of time that you wait before being seen by our physicians.       _____________________________________________________________  Should you have questions after your visit to [facility], please contact our office at [phone] between the hours of 8:30 a.m. and 4:30 p.m.  Voicemails left after 4:30 p.m. will not be returned until the following business day.  For prescription refill requests, have your pharmacy contact our office.         Resources For Cancer Patients and their Caregivers ? American Cancer Society: Can assist with transportation, wigs, general needs, runs Look Good Feel Better.        614-080-4183 ? Cancer Care: Provides financial assistance, online support groups, medication/co-pay assistance.   1-800-813-HOPE 314-606-2193) ? [employer] Assists [place] cancer patients and their families through emotional , educational and financial support.  2171482116 ? [place] DSS Where to apply for food stamps, Medicaid and utility assistance. 228-532-4222 ? [employer]: Transportation to medical appointments. 416-279-5240 ? Social Security Administration: May apply for disability if have a Stage IV cancer. 816-207-3957 623-625-0193 ? [employer], Disability and Transit Services: Assists with nutrition, care and transit needs. [place] Support Programs: @10RELATIVEDAYS @ > Cancer Support Group  2nd Tuesday of the month 1pm-2pm, [name]  > Creative Journey  3rd Tuesday of the month 1130am-1pm, [name]  > Look Good Feel Better  1st Wednesday of the month 10am-12 noon, [name] (Call [employer] to register [phone])

## 2015-07-13 NOTE — Discharge Instructions (Signed)
Avoid avoid caffinated products, such as teas, colas, coffee, chocolate. Avoid over the counter cold medicines, herbal or "natural vitamin" products, and illicit drugs because they can contain stimulants. Increase your magnesium as instructed by the Max today. Increase your home coreg to 18.75mg  (1 and 1/2 tablets of the 12.5mg  tablets) by mouth twice per day.  Call your regular medical doctor and the Cardiologist on Monday to schedule a follow up appointment in the next 3 days.  Return to the Emergency Department immediately if worsening.

## 2015-07-13 NOTE — Progress Notes (Signed)
[name], MD [address] [place] [address]  Colon cancer metastasized to liver [facility]) - Plan: HYDROcodone-acetaminophen (NORCO) 7.5-325 MG tablet  Tachycardia - Plan: EKG 12-Lead  Hypomagnesemia - Plan: EKG 12-Lead  CURRENT THERAPY: Vectubix every 2 weeks.  INTERVAL HISTORY: Karen [name] 82 y.o. female returns for followup of Stage IV CRC, KRAS wild-type, S/P microwave ablation of a solitary liver lesion on 09/01/2014.  S/P palliative Xeloda 1500 mg BID 7 days on and 7 days off beginning 01/2015 with progression of disease identified in March 2017 resulting in a discontinuation in [place] therapy.  Now on Vectibix every 2 weeks beginning on 04/20/2015 with a dose reduction on 06/15/2015.   Colon cancer metastasized to liver [facility])   04/12/1987 Definitive Surgery Dr. Lindalou Hose at [facility]   09/01/2014 Pathology Results Liver, needle/core biopsy - METASTATIC ADENOCARCINOMA.   09/01/2014 Miscellaneous MWA left liver lesion, [name] (IR)   01/18/2015 Progression PET scan   01/18/2015 PET scan Recurrence of  tumor within segment 2 of the liver. There are 2 new lesions identified within segment 5 and segment 6 of the liver worrisome for additional sites of metastasis. Evidence of tumor recurrence at the intra colonic anastomosis is noted...   01/28/2015 - 04/16/2015 Chemotherapy Xeloda 1500 mg BID 7 days on and 7 days off beginning in December 2016.   04/12/2015 Imaging CT C/A/P progression of disease, enlargement of metastatic lesion in segments 2/3 of liver, development of mass with colon at junction of descending colon and sigmoid, indeterminate lesion in lower pole of the left kidney   04/20/2015 -  Antibody Plan Vectibix every 2 weeks, single-agent.   06/12/2015 - 06/13/2015 Hospital Admission Hypotension, diarrhea, ARF, UTI   06/15/2015 Adverse Reaction Vectibix rash   06/15/2015 Treatment Plan Change Vectibix reduced to 5 mg/m2    I personally reviewed and went over  laboratory results with the patient.  The results are noted within this dictation.  Labs are updated today.  CEA is coming down.  She notes a rash.  It is on her abdomen and arms.  It is mild and improved.  She continues with moisturizing lotion and Doxycycline for Vectibix-induced rash.  She notes palpitations that come and go at home.  She denies any chest pain or SOB with these at home.  She has a long history of cardiac issues.  She notes right hip pain that is chronic.  It has increased recently.  As a result, she has increased her PO Hydrocodone over the past 24 hours.  "I am taking it every 4 hours."  But when I really try to quantify her Hydrocodone needs, she is only taking 4 per day.  At this time, I will not change her Fentanyl dose.  Review of Systems  Constitutional: Negative for fever and chills.  HENT: Negative.   Eyes: Negative.   Respiratory: Negative.  Negative for shortness of breath.   Cardiovascular: Positive for palpitations. Negative for chest pain.  Gastrointestinal: Negative.  Negative for heartburn, nausea and vomiting.  Genitourinary: Negative.   Musculoskeletal: Negative.   Skin: Positive for itching (Secondary to rash, mild) and rash (Vectibix-induced, improved).  Neurological: Negative.  Negative for dizziness.  Endo/Heme/Allergies: Negative.   Psychiatric/Behavioral: Negative.     Past Medical History  Diagnosis Date  . Chronic diastolic CHF (congestive heart failure) (HCC)     a. nl EF by echo 05/2009.  Marland Kitchen DVT of deep femoral vein ([place])   .  History of PSVT (paroxysmal supraventricular tachycardia)   . Hypertension   . Abdominal adhesions   . Ileostomy present ([place])   . Colon polyp   . Chest pain     a. 2002 Cath: nl cors;  b. 2009 aden mv: nl;  c. 05/2009 Echo: nl. d. 2014: normal nuclear stress test, EF 69%.  . Wears dentures   . Breast lump   . Hyperlipidemia   . Thyroid disease   . Anemia   . History of blood clots   . Hernia   . Blood  transfusion   . Hearing loss   . Arthritis   . GERD (gastroesophageal reflux disease)   . Cancer ([place])     bladder  . Colon cancer ([place])     a. recurrence 2016 with liver mets -  percutaneous liver biopsy and thermal ablation of a liver metastasis by interventional radiology..  . Iron deficiency anemia due to chronic blood loss 07/06/2013  . UTI (urinary tract infection)   . Obstruction of intestine or colon ([place]) 06/2014  . Diabetes mellitus without complication ([place]) 9/56/21    borderline type II; takes no medication for it  . Vision abnormalities 2016    not going blind but having retina problems; might lose ability to see faces  . LBBB (left bundle branch block)   . Asthma     hx of  . Depression   . CKD (chronic kidney disease), stage III     pt. states decreased kidney function  . Headache   . Fibromyalgia   . Achalasia     a. s/p botox injection for achalasia.  . Sinus bradycardia     a. HR 30s in 08/2014 requiring holding of digoxin.  . Family history of colon cancer   . Family history of bladder cancer   . Family history of breast cancer     Past Surgical History  Procedure Laterality Date  . Cardiac catheterization  12/10/2000    THE LEFT VENTRICLE IS MILDY DILATED. THERE IS MILD TO MODERATE DIFFUSE HYPOKINESIS WITH EF 35%  . Colon cancer surgery    . Ileostomy    . Esophagogastroduodenoscopy  02/13/2011    Procedure: ESOPHAGOGASTRODUODENOSCOPY (EGD);  Surgeon: Rogene [place], MD;  Location: [facility];  Service: Endoscopy;  Laterality: N/A;  1030  . Colonoscopy  09/26/2011    Procedure: COLONOSCOPY;  Surgeon: Rogene [place], MD;  Location: [facility];  Service: Endoscopy;  Laterality: N/A;  215  . Cataract extraction w/phaco  11/13/2011    Procedure: CATARACT EXTRACTION PHACO AND INTRAOCULAR LENS PLACEMENT (IOC);  Surgeon: Tonny Branch, MD;  Location: [facility] ORS;  Service: Ophthalmology;  Laterality: Right;  CDE 12.26  . Cataract extraction w/phaco  12/08/2011     Procedure: CATARACT EXTRACTION PHACO AND INTRAOCULAR LENS PLACEMENT (IOC);  Surgeon: Tonny Branch, MD;  Location: [facility] ORS;  Service: Ophthalmology;  Laterality: Left;  CDE 15.11  . Esophagogastroduodenoscopy N/A 09/01/2013    Procedure: ESOPHAGOGASTRODUODENOSCOPY (EGD);  Surgeon: Rogene [place], MD;  Location: [facility];  Service: Endoscopy;  Laterality: N/A;  830  . Botox injection N/A 09/01/2013    Procedure: BOTOX INJECTION;  Surgeon: Rogene [place], MD;  Location: [facility];  Service: Endoscopy;  Laterality: N/A;  . Abdominal hysterectomy    . Cholecystectomy    . Joint replacement Left 2008  . Tonsillectomy    . Colon surgery    . Eye surgery  2014    catarac surgery  .  Appendectomy    . Ovarian cystectomy 1955    . Ileostomy    . Coronary angioplasty    . Rotator cuff repair    . Breast surgery      right breast biopsy  . Liver biopsy and ablation  09/01/14    Family History  Problem Relation Age of Onset  . Stroke    . Hypertension    . Heart disease Mother   . Stroke Mother     deceased  . Arthritis Mother   . Bladder Cancer Mother     dx in her 25s  . Colon cancer Mother   . Arthritis Father   . Heart disease Father     decesaed  . Leukemia Father   . Stroke Sister     alive/debilitated  . Hypertension Sister   . Other Sister     paralysis  . Heart disease Brother     bypass surgery  . Arthritis Brother   . Colon cancer Brother   . Bladder Cancer Brother   . Stroke Sister     alive/debilitated  . Diabetes Sister   . Other Brother     stomach problems  . Other Brother     bladder  . Colon cancer Paternal Aunt   . Bladder Cancer Other     dx twice in her 30s-40s  . Breast cancer Other     great niece dx in her early 2s  . Prostate cancer Other     [place]  . Colon polyps Daughter     Social History   Social History  . Marital Status: Married    Spouse Name: N/A  . Number of Children: 4  . Years of Education: N/A   Social History Main  Topics  . Smoking status: Former Smoker -- 0.25 packs/day for 13 years    Types: Cigarettes    Quit date: 02/11/1963  . Smokeless tobacco: Never Used  . Alcohol Use: No  . Drug Use: No  . Sexual Activity: Not on file   Other Topics Concern  . Not on file   Social History Narrative     PHYSICAL EXAMINATION  ECOG PERFORMANCE STATUS: 1 - Symptomatic but completely ambulatory  Filed Vitals:   07/13/15 1033  BP: 149/57  Pulse: 66  Temp: 98.2 F (36.8 C)  Resp: 18    GENERAL:alert, no distress, well nourished, well developed, comfortable, cooperative and blunted affect, accompanied by daughter. SKIN: skin color, texture, turgor are normal.  Rash, pustular with erythematous base on abdomen, thorax, and upper extremity HEAD: Normocephalic, No masses, lesions, tenderness or abnormalities EYES: normal, EOMI, Conjunctiva are pink and non-injected EARS: External ears normal OROPHARYNX:lips, buccal mucosa, and tongue normal and mucous membranes are moist  NECK: supple, trachea midline LYMPH:  no palpable lymphadenopathy BREAST:not examined LUNGS: clear to auscultation  HEART: regular rate & rhythm, S1 normal and S2 normal ABDOMEN:abdomen soft and normal bowel sounds BACK: Back symmetric, no curvature. EXTREMITIES:less then 2 second capillary refill, no joint deformities, effusion, or inflammation, no skin discoloration, no cyanosis  NEURO: alert & oriented x 3 with fluent speech, no focal motor/sensory deficits, gait normal   LABORATORY DATA: CBC    Component Value Date/Time   WBC 3.6* 07/13/2015 1141   RBC 3.33* 07/13/2015 1141   RBC 3.45* 11/24/2008 1500   HGB 10.3* 07/13/2015 1141   HCT 31.3* 07/13/2015 1141   PLT 184 07/13/2015 1141   MCV 94.0 07/13/2015 1141   MCH 30.9 07/13/2015  1141   MCHC 32.9 07/13/2015 1141   RDW 12.4 07/13/2015 1141   LYMPHSABS 0.9 07/13/2015 1141   MONOABS 0.4 07/13/2015 1141   EOSABS 0.2 07/13/2015 1141   BASOSABS 0.0 07/13/2015 1141       Chemistry      Component Value Date/Time   NA 141 07/13/2015 1141   K 4.0 07/13/2015 1141   CL 107 07/13/2015 1141   CO2 28 07/13/2015 1141   BUN 20 07/13/2015 1141   CREATININE 0.85 07/13/2015 1141   CREATININE 0.95* 12/07/2014 1512      Component Value Date/Time   CALCIUM 8.5* 07/13/2015 1141   ALKPHOS 55 07/13/2015 1141   AST 21 07/13/2015 1141   ALT 14 07/13/2015 1141   BILITOT 0.4 07/13/2015 1141        PENDING LABS:   RADIOGRAPHIC STUDIES:  No results found.   PATHOLOGY:    ASSESSMENT AND PLAN:  Colon cancer metastasized to liver (HCC) Stage IV CRC, KRAS wild-type, S/P microwave ablation of a solitary liver lesion on 09/01/2014.  S/P palliative Xeloda 1500 mg BID 7 days on and 7 days off beginning 01/2015 with progression of disease identified in March 2017 resulting in a discontinuation in [place] therapy.  Now on Vectibix every 2 weeks beginning on 04/20/2015 with a dose reduction on 06/15/2015.  Oncology history updated.  She continues with a Vectibix-induced rash, but it is mild.  She notes it is mildly pruritic.  She continues to take Doxycycline for this.  She notes left hip pain, likely skeletal related.  She notes increasing her Hydrocodone consumption.  "I have to take it every 4 hours."  When asked if she is taking Hydrocodone 6 times per day, she responds: "no, never that much."  She reports that the most she has taken in a 24 hour period is 4 tablets.  She continues on Fentanyl patch.  I will refill Hydrocodone today to prevent her need to come to the clinic for an additional trip to pick-up the Rx.  Hypomagnesemia has been an ongoing issue without patient compliance.  As a result, she has needed IV magnesium on multiple occassions.  She reports a history of palpitations and tachycardia that come and go.  A family member has reached out to the patient's cardiologist about this with a request to restart Digoxin that was discontinued.  She reports to  taking PO magnesium TID, but compliance is questionable.    She was discharged from the examination area to be treated as planned today.  While waiting for the pre-treatment lab results, the patient noted tachycardia without SOB, chest pain, dizziness, lightheadedness, or diaphoresis.  EKG is ordered.    Labs returned with persistent hypomagnesemia.  IV mag 4 g is ordered IV.  Treatment will be deferred x 1 week.  Patient will be escorted to ED with IV mag for palpitations in the setting of hypomagnesemia.  I will increase her PO mag at home to QID dosing.  Compliance is encouraged.  Return in 3 weeks for follow-up.     ORDERS PLACED FOR THIS ENCOUNTER: Orders Placed This Encounter  Procedures  . EKG 12-Lead    MEDICATIONS PRESCRIBED THIS ENCOUNTER: Meds ordered this encounter  Medications  . HYDROcodone-acetaminophen (NORCO) 7.5-325 MG tablet    Sig: Take 1 tablet by mouth every 4 (four) hours as needed.    Dispense:  45 tablet    Refill:  0    Order Specific Question:  Supervising Provider    Answer:  Patrici Ranks [[id number]]    THERAPY PLAN:  Escort to ED.  Start IV Mag 4 g.  Defer treatment x 7 days.  All questions were answered. The patient knows to call the clinic with any problems, questions or concerns. We can certainly see the patient much sooner if necessary.  Patient and plan discussed with Dr. Ancil Linsey and she is in agreement with the aforementioned.   This note is electronically signed by: Doy Mince 07/13/2015 1:54 PM

## 2015-07-13 NOTE — Consult Note (Signed)
Primary cardiologist: Dr Darlin Coco Consulting cardiologist: Dr Carlyle Dolly MD Requesting Physician: Dr Francine Graven Indication: LBBB, tachycardia   Clinical Summary Karen [name] is a 82 y.o.female history of chronic LBBB, long term history of chest pain with normal cath 2002 and normal Lexiscan 2014, colon CA, history of PSVT, history of sinus bradycardia when on digoxin now discontinued, presents to ER with palpitations and tachycardia. From last clinic note 11/2014 she was having palpitations at that time and declined heart monitor.   Reports several year history of intermittent palpitations. Occur a few times a week, typically last just a few seconds but recently have been lasting several minutes. No significant chest pain or SOB.   Mg 1.1, K 4, Cr 0.85, BUN 20, Hgb 10.3, Plt 184 EKG: SVT with chronic LBBB Allergies  Allergen Reactions  . Bactrim [Sulfamethoxazole-Trimethoprim] Shortness Of Breath and Other (See Comments)    Hurting all over  . Codeine Palpitations and Other (See Comments)    Heart races  . Demerol Palpitations and Other (See Comments)    tachycardia  . Lidocaine Hcl Other (See Comments)    tachycardia  . Morphine And Related Palpitations    Tachycardia   . Dilaudid [Hydromorphone Hcl] Other (See Comments)    Hallucinations  . Nitrofurantoin Monohyd Macro Other (See Comments)    Unknown   . Lisinopril Cough  . Ramipril Cough    Medications Scheduled Medications:    Infusions:    PRN Medications:     Past Medical History  Diagnosis Date  . Chronic diastolic CHF (congestive heart failure) (HCC)     a. nl EF by echo 05/2009.  Marland Kitchen DVT of deep femoral vein ([place])   . History of PSVT (paroxysmal supraventricular tachycardia)   . Hypertension   . Abdominal adhesions   . Ileostomy present ([place])   . Colon polyp   . Chest pain     a. 2002 Cath: nl cors;  b. 2009 aden mv: nl;  c. 05/2009 Echo: nl. d. 2014: normal nuclear stress test,  EF 69%.  . Wears dentures   . Breast lump   . Hyperlipidemia   . Thyroid disease   . Anemia   . History of blood clots   . Hernia   . Blood transfusion   . Hearing loss   . Arthritis   . GERD (gastroesophageal reflux disease)   . Cancer ([place])     bladder  . Colon cancer ([place])     a. recurrence 2016 with liver mets -  percutaneous liver biopsy and thermal ablation of a liver metastasis by interventional radiology..  . Iron deficiency anemia due to chronic blood loss 07/06/2013  . UTI (urinary tract infection)   . Obstruction of intestine or colon ([place]) 06/2014  . Diabetes mellitus without complication ([place]) 123456    borderline type II; takes no medication for it  . Vision abnormalities 2016    not going blind but having retina problems; might lose ability to see faces  . LBBB (left bundle [name] block)   . Asthma     hx of  . Depression   . CKD (chronic kidney disease), stage III     pt. states decreased kidney function  . Headache   . Fibromyalgia   . Achalasia     a. s/p botox injection for achalasia.  . Sinus bradycardia     a. HR 30s in 08/2014 requiring holding of digoxin.  . Family history of colon cancer   .  Family history of bladder cancer   . Family history of breast cancer     Past Surgical History  Procedure Laterality Date  . Cardiac catheterization  12/10/2000    THE LEFT VENTRICLE IS MILDY DILATED. THERE IS MILD TO MODERATE DIFFUSE HYPOKINESIS WITH EF 35%  . Colon cancer surgery    . Ileostomy    . Esophagogastroduodenoscopy  02/13/2011    Procedure: ESOPHAGOGASTRODUODENOSCOPY (EGD);  Surgeon: Rogene Houston, MD;  [facility];  Service: Endoscopy;  Laterality: N/A;  1030  . Colonoscopy  09/26/2011    Procedure: COLONOSCOPY;  Surgeon: Rogene Houston, MD;  [facility]: [facility];  Service: Endoscopy;  Laterality: N/A;  215  . Cataract extraction w/phaco  11/13/2011    Procedure: CATARACT EXTRACTION PHACO AND INTRAOCULAR LENS PLACEMENT (IOC);   Surgeon: Tonny , MD;  Location: [facility];  Service: Ophthalmology;  Laterality: Right;  CDE 12.26  . Cataract extraction w/phaco  12/08/2011    Procedure: CATARACT EXTRACTION PHACO AND INTRAOCULAR LENS PLACEMENT (IOC);  Surgeon: Tonny , MD;  Location: [facility];  Service: Ophthalmology;  Laterality: Left;  CDE 15.11  . Esophagogastroduodenoscopy N/A 09/01/2013    Procedure: ESOPHAGOGASTRODUODENOSCOPY (EGD);  Surgeon: Rogene Houston, MD;  [facility];  Service: Endoscopy;  Laterality: N/A;  830  . Botox injection N/A 09/01/2013    Procedure: BOTOX INJECTION;  Surgeon: Rogene Houston, MD;  [facility];  Service: Endoscopy;  Laterality: N/A;  . Abdominal hysterectomy    . Cholecystectomy    . Joint replacement Left 2008  . Tonsillectomy    . Colon surgery    . Eye surgery  2014    catarac surgery  . Appendectomy    . Ovarian cystectomy 1955    . Ileostomy    . Coronary angioplasty    . Rotator cuff repair    . Breast surgery      right breast biopsy  . Liver biopsy and ablation  09/01/14    Family History  Problem Relation Age of Onset  . Stroke    . Hypertension    . Heart disease Mother   . Stroke Mother     deceased  . Arthritis Mother   . Bladder Cancer Mother     dx in her 69s  . Colon cancer Mother   . Arthritis Father   . Heart disease Father     decesaed  . Leukemia Father   . Stroke Sister     alive/debilitated  . Hypertension Sister   . Other Sister     paralysis  . Heart disease Brother     bypass surgery  . Arthritis Brother   . Colon cancer Brother   . Bladder Cancer Brother   . Stroke Sister     alive/debilitated  . Diabetes Sister   . Other Brother     stomach problems  . Other Brother     bladder  . Colon cancer Paternal Aunt   . Bladder Cancer Other     dx twice in her 30s-40s  . Breast cancer Other     great niece dx in her early 54s  . Prostate cancer Other     newphew  . Colon polyps Daughter     Social  History Ms. Shear reports that she quit smoking about 52 years ago. Her smoking use included Cigarettes. She has a 3.25 pack-year smoking history. She has never used smokeless tobacco. Ms. Cutillo reports that she  does not drink alcohol.  Review of Systems CONSTITUTIONAL: No weight loss, fever, chills, weakness or fatigue.  HEENT: Eyes: No visual loss, blurred vision, double vision or yellow sclerae. No hearing loss, sneezing, congestion, runny nose or sore throat.  SKIN: No rash or itching.  CARDIOVASCULAR: No chest pain, chest pressure or chest discomfort. No palpitations or edema.  RESPIRATORY: No shortness of breath, cough or sputum.  GASTROINTESTINAL: No anorexia, nausea, vomiting or diarrhea. No abdominal pain or blood.  GENITOURINARY: no polyuria, no dysuria NEUROLOGICAL: No headache, dizziness, syncope, paralysis, ataxia, numbness or tingling in the extremities. No change in bowel or bladder control.  MUSCULOSKELETAL: No muscle, back pain, joint pain or stiffness.  HEMATOLOGIC: No anemia, bleeding or bruising.  LYMPHATICS: No enlarged nodes. No history of splenectomy.  PSYCHIATRIC: No history of depression or anxiety.      Physical Examination There were no vitals taken for this visit. No intake or output data in the 24 hours ending 07/13/15 1349  HEENT: sclera clear, throat clear  Cardiovascular: RRR, no m/r/g, no jvd  Respiratory: CTAB  GI: abdomen soft, NT, ND  MSK: no LE edema  Neuro: no focal deficits  Psych: appropriate affect   Lab Results  Basic Metabolic Panel:  Recent Labs Lab 07/13/15 1141  NA 141  K 4.0  CL 107  CO2 28  GLUCOSE 89  BUN 20  CREATININE 0.85  CALCIUM 8.5*  MG 1.1*    Liver Function Tests:  Recent Labs Lab 07/13/15 1141  AST 21  ALT 14  ALKPHOS 55  BILITOT 0.4  PROT 6.6  ALBUMIN 3.5    CBC:  Recent Labs Lab 07/13/15 1141  WBC 3.6*  NEUTROABS 2.1  HGB 10.3*  HCT 31.3*  MCV 94.0  PLT 184    Cardiac  Enzymes: No results for input(s): CKTOTAL, CKMB, CKMBINDEX, TROPONINI in the last 168 hours.  BNP: Invalid input(s): POCBNP     Impression/Recommendations 1. Wide complex tachycardia - she has history of chronic LBBB and prior PSVT. - EKG consistent with SVT in setting of chronic LBBB, not an acute ventricular arrhtyhmia - agree with Mg replacement, add on TSH to labs. Her home magnesium oxide, per her report increased already today to qid.  - recommend increase her home coreg to 18.75mg  bid (1 and 1/2 tablets of the 12.5mg  tablet bid).   From a cardiac standpoint she does not require admission. After IV magnesium 4 grams completed in ER reasonable to discharge with medication changes recommended above. No indication for further cardiac testing at this time.     Carlyle Dolly, M.D.

## 2015-07-13 NOTE — Assessment & Plan Note (Addendum)
Stage IV CRC, KRAS wild-type, S/P microwave ablation of a solitary liver lesion on 09/01/2014.  S/P palliative Xeloda 1500 mg BID 7 days on and 7 days off beginning 01/2015 with progression of disease identified in March 2017 resulting in a discontinuation in [place] therapy.  Now on Vectibix every 2 weeks beginning on 04/20/2015 with a dose reduction on 06/15/2015.  Oncology history updated.  She continues with a Vectibix-induced rash, but it is mild.  She notes it is mildly pruritic.  She continues to take Doxycycline for this.  She notes left hip pain, likely skeletal related.  She notes increasing her Hydrocodone consumption.  "I have to take it every 4 hours."  When asked if she is taking Hydrocodone 6 times per day, she responds: "no, never that much."  She reports that the most she has taken in a 24 hour period is 4 tablets.  She continues on Fentanyl patch.  I will refill Hydrocodone today to prevent her need to come to the clinic for an additional trip to pick-up the Rx.  Hypomagnesemia has been an ongoing issue without patient compliance.  As a result, she has needed IV magnesium on multiple occassions.  She reports a history of palpitations and tachycardia that come and go.  A family member has reached out to the patient's cardiologist about this with a request to restart Digoxin that was discontinued.  She reports to taking PO magnesium TID, but compliance is questionable.    She was discharged from the examination area to be treated as planned today.  While waiting for the pre-treatment lab results, the patient noted tachycardia without SOB, chest pain, dizziness, lightheadedness, or diaphoresis.  EKG is ordered.    Labs returned with persistent hypomagnesemia.  IV mag 4 g is ordered IV.  Treatment will be deferred x 1 week.  Patient will be escorted to ED with IV mag for palpitations in the setting of hypomagnesemia.  I will increase her PO mag at home to QID dosing.  Compliance is  encouraged.  Return in 3 weeks for follow-up.

## 2015-07-13 NOTE — ED Notes (Signed)
Patient was at [facility] today for chemo. Patient started c/o rapid heart rate and pain in neck. Patient found to have low magnesium levels and wide complex tachycardia on EKG. [place] started patient on Magnesium (4 Grams) infusion PTA.

## 2015-07-13 NOTE — Progress Notes (Signed)
Patient c/o "heart racing" and a slight pain in her throat.  Heart rate increased to 120bpm, all other VSS.  MD and PA aware.  Magnesium level 1.1.  MD orders for patient to have IV and PO magnesium and to then be taken to the ER.  Both given as ordered and patient taken to the ER in a wheelchair.  ER was called in advance, spoke with Tiffany, regarding patient concerns.

## 2015-07-13 NOTE — Patient Instructions (Signed)
[place] at [facility] Discharge Instructions  RECOMMENDATIONS MADE BY THE CONSULTANT AND ANY TEST RESULTS WILL BE SENT TO YOUR REFERRING PHYSICIAN.  Exam done and seen today by Dr. Whitney Muse Getting treated today, labs today We refilled your hydrocodone today. Return to see the doctor in 2 weeks for treatment and followup Please call the clinic if you have any questions or concerns  Thank you for choosing [place] at [facility] to provide your oncology and hematology care.  To afford each patient quality time with our provider, please arrive at least 15 minutes before your scheduled appointment time.   Beginning January 23rd 2017 lab work for the [name] will be done in the  Main lab at [facility] on 1st floor. If you have a lab appointment with the [facility] please come in thru the  Main Entrance and check in at the main information desk  You need to re-schedule your appointment should you arrive 10 or more minutes late.  We strive to give you quality time with our providers, and arriving late affects you and other patients whose appointments are after yours.  Also, if you no show three or more times for appointments you may be dismissed from the clinic at the providers discretion.     Again, thank you for choosing [facility].  Our hope is that these requests will decrease the amount of time that you wait before being seen by our physicians.       _____________________________________________________________  Should you have questions after your visit to [facility], please contact our office at [phone] between the hours of 8:30 a.m. and 4:30 p.m.  Voicemails left after 4:30 p.m. will not be returned until the following business day.  For prescription refill requests, have your pharmacy contact our office.         Resources For Cancer Patients and their Caregivers ? American Cancer  Society: Can assist with transportation, wigs, general needs, runs Look Good Feel Better.        431-028-6261 ? Cancer Care: Provides financial assistance, online support groups, medication/co-pay assistance.  1-800-813-HOPE 573-126-9183) ? [place] Assists [place] cancer patients and their families through emotional , educational and financial support.  409-231-5662 ? [place] DSS Where to apply for food stamps, Medicaid and utility assistance. 801-472-8295 ? RCATS: Transportation to medical appointments. 506-131-4390 ? Social Security Administration: May apply for disability if have a Stage IV cancer. 2065604989 279-396-6174 ? [employer], Disability and Transit Services: Assists with nutrition, care and transit needs. [place] Support Programs: @10RELATIVEDAYS @ > Cancer Support Group  2nd Tuesday of the month 1pm-2pm, [name]  > Creative Journey  3rd Tuesday of the month 1130am-1pm, [name]  > Look Good Feel Better  1st Wednesday of the month 10am-12 noon, [name] (Call [place] to register [phone])

## 2015-07-14 LAB — CEA: CEA: 120.3 ng/mL — ABNORMAL HIGH (ref 0.0–4.7)

## 2015-07-16 ENCOUNTER — Other Ambulatory Visit (HOSPITAL_COMMUNITY): Payer: Self-pay | Admitting: *Deleted

## 2015-07-16 ENCOUNTER — Encounter (HOSPITAL_COMMUNITY): Payer: Medicare Other

## 2015-07-16 DIAGNOSIS — C189 Malignant neoplasm of colon, unspecified: Secondary | ICD-10-CM | POA: Diagnosis not present

## 2015-07-16 DIAGNOSIS — R197 Diarrhea, unspecified: Secondary | ICD-10-CM

## 2015-07-16 DIAGNOSIS — C787 Secondary malignant neoplasm of liver and intrahepatic bile duct: Principal | ICD-10-CM

## 2015-07-16 DIAGNOSIS — D509 Iron deficiency anemia, unspecified: Secondary | ICD-10-CM | POA: Diagnosis not present

## 2015-07-16 DIAGNOSIS — R790 Abnormal level of blood mineral: Secondary | ICD-10-CM

## 2015-07-16 LAB — CBC WITH DIFFERENTIAL/PLATELET
BASOS PCT: 1 %
Basophils Absolute: 0 10*3/uL (ref 0.0–0.1)
EOS ABS: 0.2 10*3/uL (ref 0.0–0.7)
EOS PCT: 6 %
HCT: 31.5 % — ABNORMAL LOW (ref 36.0–46.0)
HEMOGLOBIN: 10.4 g/dL — AB (ref 12.0–15.0)
LYMPHS ABS: 1.1 10*3/uL (ref 0.7–4.0)
Lymphocytes Relative: 29 %
MCH: 31 pg (ref 26.0–34.0)
MCHC: 33 g/dL (ref 30.0–36.0)
MCV: 94 fL (ref 78.0–100.0)
MONO ABS: 0.4 10*3/uL (ref 0.1–1.0)
MONOS PCT: 11 %
Neutro Abs: 2.1 10*3/uL (ref 1.7–7.7)
Neutrophils Relative %: 53 %
PLATELETS: 187 10*3/uL (ref 150–400)
RBC: 3.35 MIL/uL — ABNORMAL LOW (ref 3.87–5.11)
RDW: 12.4 % (ref 11.5–15.5)
WBC: 3.9 10*3/uL — ABNORMAL LOW (ref 4.0–10.5)

## 2015-07-16 LAB — COMPREHENSIVE METABOLIC PANEL
ALBUMIN: 3.5 g/dL (ref 3.5–5.0)
ALK PHOS: 55 U/L (ref 38–126)
ALT: 13 U/L — AB (ref 14–54)
ANION GAP: 5 (ref 5–15)
AST: 22 U/L (ref 15–41)
BUN: 21 mg/dL — AB (ref 6–20)
CALCIUM: 8.6 mg/dL — AB (ref 8.9–10.3)
CO2: 28 mmol/L (ref 22–32)
CREATININE: 0.87 mg/dL (ref 0.44–1.00)
Chloride: 106 mmol/L (ref 101–111)
GFR calc Af Amer: >60 mL/min (ref >60)
GFR calc non Af Amer: 60 mL/min — ABNORMAL LOW (ref >60)
GLUCOSE: 102 mg/dL — AB (ref 65–99)
Potassium: 3.7 mmol/L (ref 3.5–5.1)
SODIUM: 139 mmol/L (ref 135–145)
TOTAL PROTEIN: 6.5 g/dL (ref 6.5–8.1)
Total Bilirubin: 0.5 mg/dL (ref 0.3–1.2)

## 2015-07-16 LAB — MAGNESIUM: Magnesium: 1.2 mg/dL — ABNORMAL LOW (ref 1.7–2.4)

## 2015-07-16 MED ORDER — DIPHENOXYLATE-ATROPINE 2.5-0.025 MG PO TABS: ORAL_TABLET | ORAL | Status: DC

## 2015-07-17 DIAGNOSIS — C787 Secondary malignant neoplasm of liver and intrahepatic bile duct: Secondary | ICD-10-CM | POA: Diagnosis not present

## 2015-07-17 DIAGNOSIS — I471 Supraventricular tachycardia: Secondary | ICD-10-CM | POA: Diagnosis not present

## 2015-07-17 DIAGNOSIS — Z433 Encounter for attention to colostomy: Secondary | ICD-10-CM | POA: Diagnosis not present

## 2015-07-17 DIAGNOSIS — C189 Malignant neoplasm of colon, unspecified: Secondary | ICD-10-CM | POA: Diagnosis not present

## 2015-07-17 DIAGNOSIS — I10 Essential (primary) hypertension: Secondary | ICD-10-CM | POA: Diagnosis not present

## 2015-07-17 DIAGNOSIS — I5030 Unspecified diastolic (congestive) heart failure: Secondary | ICD-10-CM | POA: Diagnosis not present

## 2015-07-18 ENCOUNTER — Encounter (HOSPITAL_COMMUNITY)
Admission: RE | Admit: 2015-07-18 | Discharge: 2015-07-18 | Disposition: A | Discharge status: Home / Self Care | Payer: Medicare Other | Source: Ambulatory Visit | Attending: Hematology & Oncology | Admitting: Hematology & Oncology

## 2015-07-18 ENCOUNTER — Encounter (HOSPITAL_COMMUNITY): Payer: Medicare Other

## 2015-07-18 VITALS — BP 134/50 | HR 63 | Temp 97.7°F | Resp 18

## 2015-07-18 DIAGNOSIS — C189 Malignant neoplasm of colon, unspecified: Secondary | ICD-10-CM | POA: Diagnosis not present

## 2015-07-18 DIAGNOSIS — R790 Abnormal level of blood mineral: Secondary | ICD-10-CM

## 2015-07-18 DIAGNOSIS — C787 Secondary malignant neoplasm of liver and intrahepatic bile duct: Secondary | ICD-10-CM | POA: Insufficient documentation

## 2015-07-18 MED ORDER — MAGNESIUM SULFATE 4 GM/100ML IV SOLN
4.0000 g | Freq: Once | INTRAVENOUS | Status: AC
  Administered 2015-07-18: 4 g via INTRAVENOUS
  Filled 2015-07-18: qty 100

## 2015-07-18 MED ORDER — SODIUM CHLORIDE 0.9 % IV SOLN
4.0000 g | Freq: Once | INTRAVENOUS | Status: DC
  Filled 2015-07-18: qty 8

## 2015-07-18 MED ORDER — SODIUM CHLORIDE 0.9 % IV SOLN
INTRAVENOUS | Status: DC
  Administered 2015-07-18: 10 mL/h via INTRAVENOUS

## 2015-07-20 ENCOUNTER — Encounter (HOSPITAL_COMMUNITY): Payer: Self-pay | Admitting: Oncology

## 2015-07-20 ENCOUNTER — Other Ambulatory Visit (HOSPITAL_COMMUNITY): Payer: Self-pay | Admitting: Oncology

## 2015-07-20 ENCOUNTER — Encounter (HOSPITAL_BASED_OUTPATIENT_CLINIC_OR_DEPARTMENT_OTHER): Payer: Medicare Other

## 2015-07-20 ENCOUNTER — Encounter (HOSPITAL_BASED_OUTPATIENT_CLINIC_OR_DEPARTMENT_OTHER): Payer: Medicare Other | Admitting: Oncology

## 2015-07-20 VITALS — BP 129/52 | HR 68 | Temp 98.1°F | Resp 18 | Wt 147.4 lb

## 2015-07-20 DIAGNOSIS — C787 Secondary malignant neoplasm of liver and intrahepatic bile duct: Secondary | ICD-10-CM

## 2015-07-20 DIAGNOSIS — C189 Malignant neoplasm of colon, unspecified: Secondary | ICD-10-CM

## 2015-07-20 DIAGNOSIS — L270 Generalized skin eruption due to drugs and medicaments taken internally: Secondary | ICD-10-CM

## 2015-07-20 DIAGNOSIS — D509 Iron deficiency anemia, unspecified: Secondary | ICD-10-CM | POA: Diagnosis not present

## 2015-07-20 DIAGNOSIS — Z5112 Encounter for antineoplastic immunotherapy: Secondary | ICD-10-CM

## 2015-07-20 LAB — COMPREHENSIVE METABOLIC PANEL
ALBUMIN: 3.5 g/dL (ref 3.5–5.0)
ALK PHOS: 64 U/L (ref 38–126)
ALT: 12 U/L — ABNORMAL LOW (ref 14–54)
ANION GAP: 4 — AB (ref 5–15)
AST: 21 U/L (ref 15–41)
BILIRUBIN TOTAL: 0.4 mg/dL (ref 0.3–1.2)
BUN: 20 mg/dL (ref 6–20)
CALCIUM: 8.7 mg/dL — AB (ref 8.9–10.3)
CO2: 26 mmol/L (ref 22–32)
Chloride: 110 mmol/L (ref 101–111)
Creatinine, Ser: 0.8 mg/dL (ref 0.44–1.00)
GFR calc non Af Amer: >60 mL/min (ref >60)
GLUCOSE: 101 mg/dL — AB (ref 65–99)
POTASSIUM: 3.5 mmol/L (ref 3.5–5.1)
SODIUM: 140 mmol/L (ref 135–145)
TOTAL PROTEIN: 6.7 g/dL (ref 6.5–8.1)

## 2015-07-20 LAB — CBC WITH DIFFERENTIAL/PLATELET
BASOS PCT: 0 %
Basophils Absolute: 0 10*3/uL (ref 0.0–0.1)
EOS ABS: 0.2 10*3/uL (ref 0.0–0.7)
Eosinophils Relative: 6 %
HEMATOCRIT: 33.5 % — AB (ref 36.0–46.0)
HEMOGLOBIN: 11.1 g/dL — AB (ref 12.0–15.0)
LYMPHS ABS: 1.1 10*3/uL (ref 0.7–4.0)
Lymphocytes Relative: 32 %
MCH: 31 pg (ref 26.0–34.0)
MCHC: 33.1 g/dL (ref 30.0–36.0)
MCV: 93.6 fL (ref 78.0–100.0)
MONO ABS: 0.3 10*3/uL (ref 0.1–1.0)
MONOS PCT: 9 %
Neutro Abs: 1.8 10*3/uL (ref 1.7–7.7)
Neutrophils Relative %: 53 %
Platelets: 179 10*3/uL (ref 150–400)
RBC: 3.58 MIL/uL — ABNORMAL LOW (ref 3.87–5.11)
RDW: 12.2 % (ref 11.5–15.5)
WBC: 3.5 10*3/uL — ABNORMAL LOW (ref 4.0–10.5)

## 2015-07-20 LAB — MAGNESIUM: MAGNESIUM: 1.2 mg/dL — AB (ref 1.7–2.4)

## 2015-07-20 MED ORDER — SODIUM CHLORIDE 0.9% FLUSH
10.0000 mL | INTRAVENOUS | Status: DC | PRN
Start: 2015-07-20 — End: 2015-07-20

## 2015-07-20 MED ORDER — MAGNESIUM SULFATE 4 GM/100ML IV SOLN
4.0000 g | Freq: Once | INTRAVENOUS | Status: AC
  Administered 2015-07-20: 4 g via INTRAVENOUS
  Filled 2015-07-20: qty 100

## 2015-07-20 MED ORDER — SODIUM CHLORIDE 0.9 % IV SOLN
Freq: Once | INTRAVENOUS | Status: AC
  Administered 2015-07-20: 11:00:00 via INTRAVENOUS

## 2015-07-20 MED ORDER — SODIUM CHLORIDE 0.9 % IV SOLN
4.9980 mg/kg | Freq: Once | INTRAVENOUS | Status: AC
  Administered 2015-07-20: 340 mg via INTRAVENOUS
  Filled 2015-07-20: qty 17

## 2015-07-20 NOTE — Patient Instructions (Signed)
[facility] Discharge Instructions for Patients Receiving Chemotherapy   Beginning January 23rd 2017 lab work for the [facility] will be done in the  Main lab at [facility] on 1st floor. If you have a lab appointment with the [facility] please come in thru the  Main Entrance and check in at the main information desk   Today you received the following chemotherapy agent: Vectibix.     If you develop nausea and vomiting, or diarrhea that is not controlled by your medication, call the clinic.  The clinic phone number is [phone]. Office hours are Monday-Friday 8:30am-5:00pm.  BELOW ARE SYMPTOMS THAT SHOULD BE REPORTED IMMEDIATELY:  *FEVER GREATER THAN 101.0 F  *CHILLS WITH OR WITHOUT FEVER  NAUSEA AND VOMITING THAT IS NOT CONTROLLED WITH YOUR NAUSEA MEDICATION  *UNUSUAL SHORTNESS OF BREATH  *UNUSUAL BRUISING OR BLEEDING  TENDERNESS IN MOUTH AND THROAT WITH OR WITHOUT PRESENCE OF ULCERS  *URINARY PROBLEMS  *BOWEL PROBLEMS  UNUSUAL RASH Items with * indicate a potential emergency and should be followed up as soon as possible. If you have an emergency after office hours please contact your primary care physician or go to the nearest emergency department.  Please call the clinic during office hours if you have any questions or concerns.   You may also contact the Patient Navigator at [phone] should you have any questions or need assistance in obtaining follow up care.      Resources For Cancer Patients and their Caregivers ? American Cancer Society: Can assist with transportation, wigs, general needs, runs Look Good Feel Better.        5091201619 ? Cancer Care: Provides financial assistance, online support groups, medication/co-pay assistance.  1-800-813-HOPE (938) 258-5600) ? [place] Assists [place] cancer patients and their families through emotional , educational and financial support.   4348521888 ? [place] DSS Where to apply for food stamps, Medicaid and utility assistance. 817-436-8693 ? RCATS: Transportation to medical appointments. 972 474 1682 ? Social Security Administration: May apply for disability if have a Stage IV cancer. 754-097-2605 [phone])408-4893 ? [employer], Disability and Transit Services: Assists with nutrition, care and transit needs. 225-367-1212

## 2015-07-20 NOTE — Assessment & Plan Note (Addendum)
Stage IV CRC, KRAS wild-type, S/P microwave ablation of a solitary liver lesion on 09/01/2014.  S/P palliative Xeloda 1500 mg BID 7 days on and 7 days off beginning 01/2015 with progression of disease identified in March 2017 resulting in a discontinuation in [place] therapy.  Now on Vectibix every 2 weeks beginning on 04/20/2015 with a dose reduction on 06/15/2015.  Oncology history updated.  She continues with a Vectibix-induced rash, but it is mild.  She notes it is mildly pruritic.  She continues to take Doxycycline for this.  Labs will be updated today.  Pre-treatment labs as ordered.  Blood counts meet treatment parameters.  Hypomagnesemia persists.  Order placed for another 4 g of IV magnesium.  She is to continue with QID PO Magnesium at home.  She reports compliance with this medication as well.  She will return on Monday for labs and another 4 g IV magnesium infusion in short stay.  Code Status discussed.  She remains very unrealistic regarding goals of care.  "I want to live as long as I can."  She wants to remain a FULL CODE.  "The good Lord will take me when he is ready, even if I'm on a respiratory."   Return in 4 weeks for follow-up with treatment every 2 weeks.

## 2015-07-20 NOTE — Progress Notes (Signed)
Karen Hilding, MD [address] [place] [address]  Colon cancer metastasized to liver [facility]) - Plan: CBC with Differential, Basic metabolic panel, Magnesium  Hypomagnesemia - Plan: Magnesium, magnesium sulfate IVPB 4 g 100 mL, DISCONTINUED: magnesium sulfate IVPB 4 g 100 mL  CURRENT THERAPY: Vectubix every 2 weeks.  INTERVAL HISTORY: Karen [name] 82 y.o. female returns for followup of Stage IV CRC, KRAS wild-type, S/P microwave ablation of a solitary liver lesion on 09/01/2014.  S/P palliative Xeloda 1500 mg BID 7 days on and 7 days off beginning 01/2015 with progression of disease identified in March 2017 resulting in a discontinuation in [place] therapy.  Now on Vectibix every 2 weeks beginning on 04/20/2015 with a dose reduction on 06/15/2015.    Colon cancer metastasized to liver [facility])   04/12/1987 Definitive Surgery Dr. Lindalou Hose at [facility]   09/01/2014 Pathology Results Liver, needle/core biopsy - METASTATIC ADENOCARCINOMA.   09/01/2014 Miscellaneous MWA left liver lesion, [name] (IR)   01/18/2015 Progression PET scan   01/18/2015 PET scan Recurrence of  tumor within segment 2 of the liver. There are 2 new lesions identified within segment 5 and segment 6 of the liver worrisome for additional sites of metastasis. Evidence of tumor recurrence at the intra colonic anastomosis is noted...   01/28/2015 - 04/16/2015 Chemotherapy Xeloda 1500 mg BID 7 days on and 7 days off beginning in December 2016.   04/12/2015 Imaging CT C/A/P progression of disease, enlargement of metastatic lesion in segments 2/3 of liver, development of mass with colon at junction of descending colon and sigmoid, indeterminate lesion in lower pole of the left kidney   04/20/2015 -  Antibody Plan Vectibix every 2 weeks, single-agent.   06/12/2015 - 06/13/2015 Hospital Admission Hypotension, diarrhea, ARF, UTI   06/15/2015 Adverse Reaction Vectibix rash   06/15/2015 Treatment Plan Change Vectibix reduced to  5 mg/m2   07/13/2015 Treatment Plan Change Treatment deferred due to hypomagnesemia and palpitations.  Escorted to ED.    I personally reviewed and went over laboratory results with the patient.  The results are noted within this dictation.  Labs are updated today.    On our last encounter, she was escorted to the ED in the setting of tachycardia, complaints of palpitations, and hypomagnesemia with patient poor compliance with PO replacement due to "side effects."  She was evaluated in the ED and Cardiology was consulted.  She was seen by Dr. Harl Bowie:  1. Wide complex tachycardia - she has history of chronic LBBB and prior PSVT. - EKG consistent with SVT in setting of chronic LBBB, not an acute ventricular arrhtyhmia - agree with Mg replacement, add on TSH to labs. Her home magnesium oxide, per her report increased already today to qid.  - recommend increase her home coreg to 18.52m bid (1 and 1/2 tablets of the 12.53mtablet bid).   From a cardiac standpoint she does not require admission. After IV magnesium 4 grams completed in ER reasonable to discharge with medication changes recommended above. No indication for further cardiac testing at this time.   We again discussed Code Status.  She remains unrealistic regarding her goals of care.  "I want to live as long as I can."  She wants CPR and intubation if indicated.  She relays that her siblings lived into their 9063's Her father had an MI and was intubated and died in ICU the following day.  "The good LoReita Clicheill take me  when he is ready, even if I'm on a respiratory."  She is educated about situations that may require family to make decisions for her, and unfortunately, these can create family issues and fights.  "I have a living will" reporting that it lays out her wishes in those situations.  She thinks that since her family members lived a long life, she is expecting one as well.     She notes that her daughter and grandaughter are here.  "They  won't let my granddaughter back here [chemo-area]."  She is educated the reasoning for not letting children in the chemotherapy area and that has been a clinic policy for many years.  She understands the reasoning now that I explained it to her.  Ultimately, the reasoning is to protect the child and our patients.   Review of Systems  Constitutional: Negative for fever and chills.  HENT: Negative.   Eyes: Negative.   Respiratory: Negative.  Negative for shortness of breath.   Cardiovascular: Positive for palpitations. Negative for chest pain.  Gastrointestinal: Negative.  Negative for heartburn, nausea and vomiting.  Genitourinary: Negative.   Musculoskeletal: Negative.   Skin: Positive for itching (Secondary to rash, mild) and rash (Vectibix-induced, improved).  Neurological: Negative.  Negative for dizziness.  Endo/Heme/Allergies: Negative.   Psychiatric/Behavioral: Negative.     Past Medical History  Diagnosis Date  . Chronic diastolic CHF (congestive heart failure) (HCC)     a. nl EF by echo 05/2009.  Marland Kitchen DVT of deep femoral vein ([place])   . History of PSVT (paroxysmal supraventricular tachycardia)   . Hypertension   . Abdominal adhesions   . Ileostomy present ([place])   . Colon polyp   . Chest pain     a. 2002 Cath: nl cors;  b. 2009 aden mv: nl;  c. 05/2009 Echo: nl. d. 2014: normal nuclear stress test, EF 69%.  . Wears dentures   . Breast lump   . Hyperlipidemia   . Thyroid disease   . Anemia   . History of blood clots   . Hernia   . Blood transfusion   . Hearing loss   . Arthritis   . GERD (gastroesophageal reflux disease)   . Cancer ([place])     bladder  . Colon cancer ([place])     a. recurrence 2016 with liver mets -  percutaneous liver biopsy and thermal ablation of a liver metastasis by interventional radiology..  . Iron deficiency anemia due to chronic blood loss 07/06/2013  . UTI (urinary tract infection)   . Obstruction of intestine or colon ([place]) 06/2014  . Diabetes  mellitus without complication ([place]) 9/62/95    borderline type II; takes no medication for it  . Vision abnormalities 2016    not going blind but having retina problems; might lose ability to see faces  . LBBB (left bundle branch block)   . Asthma     hx of  . Depression   . CKD (chronic kidney disease), stage III     pt. states decreased kidney function  . Headache   . Fibromyalgia   . Achalasia     a. s/p botox injection for achalasia.  . Sinus bradycardia     a. HR 30s in 08/2014 requiring holding of digoxin.  . Family history of colon cancer   . Family history of bladder cancer   . Family history of breast cancer     Past Surgical History  Procedure Laterality Date  . Cardiac catheterization  12/10/2000  THE LEFT VENTRICLE IS MILDY DILATED. THERE IS MILD TO MODERATE DIFFUSE HYPOKINESIS WITH EF 35%  . Colon cancer surgery    . Ileostomy    . Esophagogastroduodenoscopy  02/13/2011    Procedure: ESOPHAGOGASTRODUODENOSCOPY (EGD);  Surgeon: Rogene Houston, MD;  Location: [facility];  Service: Endoscopy;  Laterality: N/A;  1030  . Colonoscopy  09/26/2011    Procedure: COLONOSCOPY;  Surgeon: Rogene Houston, MD;  Location: [facility];  Service: Endoscopy;  Laterality: N/A;  215  . Cataract extraction w/phaco  11/13/2011    Procedure: CATARACT EXTRACTION PHACO AND INTRAOCULAR LENS PLACEMENT (IOC);  Surgeon: Tonny Branch, MD;  Location: [facility] ORS;  Service: Ophthalmology;  Laterality: Right;  CDE 12.26  . Cataract extraction w/phaco  12/08/2011    Procedure: CATARACT EXTRACTION PHACO AND INTRAOCULAR LENS PLACEMENT (IOC);  Surgeon: Tonny Branch, MD;  Location: [facility] ORS;  Service: Ophthalmology;  Laterality: Left;  CDE 15.11  . Esophagogastroduodenoscopy N/A 09/01/2013    Procedure: ESOPHAGOGASTRODUODENOSCOPY (EGD);  Surgeon: Rogene Houston, MD;  Location: [facility];  Service: Endoscopy;  Laterality: N/A;  830  . Botox injection N/A 09/01/2013    Procedure: BOTOX INJECTION;  Surgeon:  Rogene Houston, MD;  Location: [facility];  Service: Endoscopy;  Laterality: N/A;  . Abdominal hysterectomy    . Cholecystectomy    . Joint replacement Left 2008  . Tonsillectomy    . Colon surgery    . Eye surgery  2014    catarac surgery  . Appendectomy    . Ovarian cystectomy 1955    . Ileostomy    . Coronary angioplasty    . Rotator cuff repair    . Breast surgery      right breast biopsy  . Liver biopsy and ablation  09/01/14    Family History  Problem Relation Age of Onset  . Stroke    . Hypertension    . Heart disease Mother   . Stroke Mother     deceased  . Arthritis Mother   . Bladder Cancer Mother     dx in her 77s  . Colon cancer Mother   . Arthritis Father   . Heart disease Father     decesaed  . Leukemia Father   . Stroke Sister     alive/debilitated  . Hypertension Sister   . Other Sister     paralysis  . Heart disease Brother     bypass surgery  . Arthritis Brother   . Colon cancer Brother   . Bladder Cancer Brother   . Stroke Sister     alive/debilitated  . Diabetes Sister   . Other Brother     stomach problems  . Other Brother     bladder  . Colon cancer Paternal Aunt   . Bladder Cancer Other     dx twice in her 30s-40s  . Breast cancer Other     great niece dx in her early 24s  . Prostate cancer Other     [place]  . Colon polyps Daughter     Social History   Social History  . Marital Status: Married    Spouse Name: N/A  . Number of Children: 4  . Years of Education: N/A   Social History Main Topics  . Smoking status: Former Smoker -- 0.25 packs/day for 13 years    Types: Cigarettes    Quit date: 02/11/1963  . Smokeless tobacco: Never Used  . Alcohol Use: No  .  Drug Use: No  . Sexual Activity: Not Asked   Other Topics Concern  . None   Social History Narrative     PHYSICAL EXAMINATION  ECOG PERFORMANCE STATUS: 1 - Symptomatic but completely ambulatory  Filed Vitals:   07/20/15 0918  BP: 129/52  Pulse: 68    Temp: 98.1 F (36.7 C)  Resp: 18    GENERAL:alert, no distress, well nourished, well developed, comfortable, cooperative and blunted affect, unaccompanied at time of exam and discussion. SKIN: skin color, texture, turgor are normal.   HEAD: Normocephalic, No masses, lesions, tenderness or abnormalities EYES: normal, EOMI, Conjunctiva are pink and non-injected EARS: External ears normal OROPHARYNX:lips, buccal mucosa, and tongue normal and mucous membranes are moist  NECK: supple, trachea midline LYMPH:  no palpable lymphadenopathy BREAST:not examined LUNGS: clear to auscultation  HEART: regular rate & rhythm, S1 normal and S2 normal.  NSR. ABDOMEN:abdomen soft and normal bowel sounds BACK: Back symmetric, no curvature. EXTREMITIES:less then 2 second capillary refill, no joint deformities, effusion, or inflammation, no skin discoloration, no cyanosis  NEURO: alert & oriented x 3 with fluent speech, no focal motor/sensory deficits, gait normal   LABORATORY DATA: CBC    Component Value Date/Time   WBC 3.5* 07/20/2015 0915   RBC 3.58* 07/20/2015 0915   RBC 3.45* 11/24/2008 1500   HGB 11.1* 07/20/2015 0915   HCT 33.5* 07/20/2015 0915   PLT 179 07/20/2015 0915   MCV 93.6 07/20/2015 0915   MCH 31.0 07/20/2015 0915   MCHC 33.1 07/20/2015 0915   RDW 12.2 07/20/2015 0915   LYMPHSABS 1.1 07/20/2015 0915   MONOABS 0.3 07/20/2015 0915   EOSABS 0.2 07/20/2015 0915   BASOSABS 0.0 07/20/2015 0915      Chemistry      Component Value Date/Time   NA 140 07/20/2015 0915   K 3.5 07/20/2015 0915   CL 110 07/20/2015 0915   CO2 26 07/20/2015 0915   BUN 20 07/20/2015 0915   CREATININE 0.80 07/20/2015 0915   CREATININE 0.95* 12/07/2014 1512      Component Value Date/Time   CALCIUM 8.7* 07/20/2015 0915   ALKPHOS 64 07/20/2015 0915   AST 21 07/20/2015 0915   ALT 12* 07/20/2015 0915   BILITOT 0.4 07/20/2015 0915        PENDING LABS:   RADIOGRAPHIC STUDIES:  No results  found.   PATHOLOGY:    ASSESSMENT AND PLAN:  Colon cancer metastasized to liver (HCC) Stage IV CRC, KRAS wild-type, S/P microwave ablation of a solitary liver lesion on 09/01/2014.  S/P palliative Xeloda 1500 mg BID 7 days on and 7 days off beginning 01/2015 with progression of disease identified in March 2017 resulting in a discontinuation in [place] therapy.  Now on Vectibix every 2 weeks beginning on 04/20/2015 with a dose reduction on 06/15/2015.  Oncology history updated.  She continues with a Vectibix-induced rash, but it is mild.  She notes it is mildly pruritic.  She continues to take Doxycycline for this.  Labs will be updated today.  Pre-treatment labs as ordered.  Blood counts meet treatment parameters.  Hypomagnesemia persists.  Order placed for another 4 g of IV magnesium.  She is to continue with QID PO Magnesium at home.  She reports compliance with this medication as well.  She will return on Monday for labs and another 4 g IV magnesium infusion in short stay.  Code Status discussed.  She remains very unrealistic regarding goals of care.  "I want to live as long  as I can."  She wants to remain a FULL CODE.  "The good Lord will take me when he is ready, even if I'm on a respiratory."   Return in 4 weeks for follow-up with treatment every 2 weeks.    ORDERS PLACED FOR THIS ENCOUNTER: Orders Placed This Encounter  Procedures  . CBC with Differential  . Basic metabolic panel  . Magnesium    MEDICATIONS PRESCRIBED THIS ENCOUNTER: No orders of the defined types were placed in this encounter.    THERAPY PLAN:  Will continue with treatment as planned.  All questions were answered. The patient knows to call the clinic with any problems, questions or concerns. We can certainly see the patient much sooner if necessary.  Patient and plan discussed with Dr. Ancil Linsey and she is in agreement with the aforementioned.   This note is electronically signed by:  Doy Mince 07/20/2015 6:43 PM

## 2015-07-20 NOTE — Patient Instructions (Signed)
[place] at [facility] Discharge Instructions  RECOMMENDATIONS MADE BY THE CONSULTANT AND ANY TEST RESULTS WILL BE SENT TO YOUR REFERRING PHYSICIAN.  Treatment today  Labs Monday  IV Magnesium Monday in Short Stay  Return in 2 weeks for chemo  Return in 4 weeks for chemo and follow up with doctor  Thank you for choosing [place] at [facility] to provide your oncology and hematology care.  To afford each patient quality time with our provider, please arrive at least 15 minutes before your scheduled appointment time.   Beginning January 23rd 2017 lab work for the [name] will be done in the  Main lab at [facility] on 1st floor. If you have a lab appointment with the [facility] please come in thru the  Main Entrance and check in at the main information desk  You need to re-schedule your appointment should you arrive 10 or more minutes late.  We strive to give you quality time with our providers, and arriving late affects you and other patients whose appointments are after yours.  Also, if you no show three or more times for appointments you may be dismissed from the clinic at the providers discretion.     Again, thank you for choosing [facility].  Our hope is that these requests will decrease the amount of time that you wait before being seen by our physicians.       _____________________________________________________________  Should you have questions after your visit to [facility], please contact our office at [phone] between the hours of 8:30 a.m. and 4:30 p.m.  Voicemails left after 4:30 p.m. will not be returned until the following business day.  For prescription refill requests, have your pharmacy contact our office.         Resources For Cancer Patients and their Caregivers ? American Cancer Society: Can assist with transportation, wigs, general needs, runs Look Good Feel Better.         [phone] ? Cancer Care: Provides financial assistance, online support groups, medication/co-pay assistance.  1-800-813-HOPE 207-354-5115) ? [place] Assists [place] cancer patients and their families through emotional , educational and financial support.  (260) 202-9800 ? [place] DSS Where to apply for food stamps, Medicaid and utility assistance. 902-717-7028 ? [facility]: Transportation to medical appointments. 5673212784 ? Social Security Administration: May apply for disability if have a Stage IV cancer. (312) 563-2511 806-770-4402 ? [employer], Disability and Transit Services: Assists with nutrition, care and transit needs. [place] Support Programs: @10RELATIVEDAYS @ > Cancer Support Group  2nd Tuesday of the month 1pm-2pm, [name]  > Creative Journey  3rd Tuesday of the month 1130am-1pm, [name]  > Look Good Feel Better  1st Wednesday of the month 10am-12 noon, [name] (Call [place] to register [phone])

## 2015-07-20 NOTE — Progress Notes (Signed)
Patient tolerated infusion well.  VSS.   

## 2015-07-23 ENCOUNTER — Encounter (HOSPITAL_COMMUNITY): Payer: Medicare Other

## 2015-07-23 ENCOUNTER — Encounter: Payer: Self-pay | Admitting: Cardiology

## 2015-07-23 ENCOUNTER — Encounter (HOSPITAL_COMMUNITY)
Admission: RE | Admit: 2015-07-23 | Discharge: 2015-07-23 | Disposition: A | Discharge status: Home / Self Care | Payer: Medicare Other | Source: Ambulatory Visit | Attending: Hematology & Oncology | Admitting: Hematology & Oncology

## 2015-07-23 ENCOUNTER — Other Ambulatory Visit (HOSPITAL_COMMUNITY): Payer: Self-pay | Admitting: Oncology

## 2015-07-23 ENCOUNTER — Other Ambulatory Visit (HOSPITAL_COMMUNITY): Payer: Medicare Other

## 2015-07-23 ENCOUNTER — Ambulatory Visit (INDEPENDENT_AMBULATORY_CARE_PROVIDER_SITE_OTHER): Payer: Medicare Other | Admitting: Cardiology

## 2015-07-23 VITALS — BP 136/72 | HR 80 | Ht 60.0 in | Wt 148.0 lb

## 2015-07-23 DIAGNOSIS — C189 Malignant neoplasm of colon, unspecified: Secondary | ICD-10-CM

## 2015-07-23 DIAGNOSIS — R790 Abnormal level of blood mineral: Secondary | ICD-10-CM

## 2015-07-23 DIAGNOSIS — I471 Supraventricular tachycardia: Secondary | ICD-10-CM | POA: Diagnosis not present

## 2015-07-23 DIAGNOSIS — I447 Left bundle-branch block, unspecified: Secondary | ICD-10-CM | POA: Diagnosis not present

## 2015-07-23 DIAGNOSIS — C787 Secondary malignant neoplasm of liver and intrahepatic bile duct: Secondary | ICD-10-CM | POA: Diagnosis not present

## 2015-07-23 DIAGNOSIS — D509 Iron deficiency anemia, unspecified: Secondary | ICD-10-CM | POA: Diagnosis not present

## 2015-07-23 LAB — CBC WITH DIFFERENTIAL/PLATELET
BASOS ABS: 0 10*3/uL (ref 0.0–0.1)
BASOS PCT: 0 %
Eosinophils Absolute: 0.2 10*3/uL (ref 0.0–0.7)
Eosinophils Relative: 5 %
HEMATOCRIT: 32.4 % — AB (ref 36.0–46.0)
HEMOGLOBIN: 10.6 g/dL — AB (ref 12.0–15.0)
LYMPHS PCT: 28 %
Lymphs Abs: 1.2 10*3/uL (ref 0.7–4.0)
MCH: 30.6 pg (ref 26.0–34.0)
MCHC: 32.7 g/dL (ref 30.0–36.0)
MCV: 93.6 fL (ref 78.0–100.0)
MONO ABS: 0.4 10*3/uL (ref 0.1–1.0)
Monocytes Relative: 9 %
NEUTROS ABS: 2.5 10*3/uL (ref 1.7–7.7)
Neutrophils Relative %: 58 %
PLATELETS: 186 10*3/uL (ref 150–400)
RBC: 3.46 MIL/uL — AB (ref 3.87–5.11)
RDW: 12.5 % (ref 11.5–15.5)
WBC: 4.4 10*3/uL (ref 4.0–10.5)

## 2015-07-23 LAB — BASIC METABOLIC PANEL
Anion gap: 5 (ref 5–15)
BUN: 22 mg/dL — AB (ref 6–20)
CHLORIDE: 109 mmol/L (ref 101–111)
CO2: 27 mmol/L (ref 22–32)
CREATININE: 0.91 mg/dL (ref 0.44–1.00)
Calcium: 8.7 mg/dL — ABNORMAL LOW (ref 8.9–10.3)
GFR calc Af Amer: >60 mL/min (ref >60)
GFR calc non Af Amer: 57 mL/min — ABNORMAL LOW (ref >60)
GLUCOSE: 117 mg/dL — AB (ref 65–99)
Potassium: 4 mmol/L (ref 3.5–5.1)
SODIUM: 141 mmol/L (ref 135–145)

## 2015-07-23 LAB — MAGNESIUM: Magnesium: 1.2 mg/dL — ABNORMAL LOW (ref 1.7–2.4)

## 2015-07-23 MED ORDER — SODIUM CHLORIDE 0.9 % IV SOLN
INTRAVENOUS | Status: DC
  Administered 2015-07-23: 12:00:00 via INTRAVENOUS

## 2015-07-23 MED ORDER — MAGNESIUM SULFATE 4 GM/100ML IV SOLN
4.0000 g | Freq: Once | INTRAVENOUS | Status: AC
  Administered 2015-07-23: 4 g via INTRAVENOUS
  Filled 2015-07-23: qty 100

## 2015-07-23 NOTE — Progress Notes (Signed)
Cardiology Office Note Date:  07/23/2015  Patient ID:  NEERA DOGAN, DOB [birth year], MRN [id number] PCP:  [name], MD  Cardiologist:  Thaddius Manes Martinique MD  Chief Complaint: f/u tachycardia  History of Present Illness: SHATORIA GODLESKI is a 82 y.o. female with history of PSVT, chronic intermittent chest pain (normal cath 2002, normal nuc 2014), colon cancer with metastasis to liver, HLD, thyroid disease, iron deficiency anemia, achalasia s/p Botox, remote intra-abdominal infection requiring ileostomy, LBBB, sinus bradycardia who presents for  follow-up. She has recurrent colon CA and on 09/01/14 she underwent percutaneous liver biopsy and thermal ablation of a liver metastasis by interventional radiology.She has a history of significant bradycardia and Digoxin was stopped.   She recently presented to the hospital with tachycardia. Ecg showed SVT with rate 117. She had LBBB morphology. She was severely magnesium depleted. Her beta blocker was increased and she has received several IV infusions of magnesium. Since then she has noted only a couple of episodes of tachycardia lasting a few seconds. She does report a lot of diarrhea and is taking a lot of lomotil and Imodium. No dizziness or syncope. No dyspnea.   Past Medical History  Diagnosis Date  . Chronic diastolic CHF (congestive heart failure) (HCC)     a. nl EF by echo 05/2009.  Marland Kitchen DVT of deep femoral vein ([place])   . History of PSVT (paroxysmal supraventricular tachycardia)   . Hypertension   . Abdominal adhesions   . Ileostomy present ([place])   . Colon polyp   . Chest pain     a. 2002 Cath: nl cors;  b. 2009 aden mv: nl;  c. 05/2009 Echo: nl. d. 2014: normal nuclear stress test, EF 69%.  . Wears dentures   . Breast lump   . Hyperlipidemia   . Thyroid disease   . Anemia   . History of blood clots   . Hernia   . Blood transfusion   . Hearing loss   . Arthritis   . GERD (gastroesophageal reflux disease)   . Cancer ([place])    bladder  . Colon cancer ([place])     a. recurrence 2016 with liver mets -  percutaneous liver biopsy and thermal ablation of a liver metastasis by interventional radiology..  . Iron deficiency anemia due to chronic blood loss 07/06/2013  . UTI (urinary tract infection)   . Obstruction of intestine or colon ([place]) 06/2014  . Diabetes mellitus without complication ([place]) 123456    borderline type II; takes no medication for it  . Vision abnormalities 2016    not going blind but having retina problems; might lose ability to see faces  . LBBB (left bundle branch block)   . Asthma     hx of  . Depression   . CKD (chronic kidney disease), stage III     pt. states decreased kidney function  . Headache   . Fibromyalgia   . Achalasia     a. s/p botox injection for achalasia.  . Sinus bradycardia     a. HR 30s in 08/2014 requiring holding of digoxin.  . Family history of colon cancer   . Family history of bladder cancer   . Family history of breast cancer     Past Surgical History  Procedure Laterality Date  . Cardiac catheterization  12/10/2000    THE LEFT VENTRICLE IS MILDY DILATED. THERE IS MILD TO MODERATE DIFFUSE HYPOKINESIS WITH EF 35%  . Colon cancer surgery    .  Ileostomy    . Esophagogastroduodenoscopy  02/13/2011    Procedure: ESOPHAGOGASTRODUODENOSCOPY (EGD);  Surgeon: Rogene [place], MD;  Location: [facility];  Service: Endoscopy;  Laterality: N/A;  1030  . Colonoscopy  09/26/2011    Procedure: COLONOSCOPY;  Surgeon: Rogene [place], MD;  Location: [facility];  Service: Endoscopy;  Laterality: N/A;  215  . Cataract extraction w/phaco  11/13/2011    Procedure: CATARACT EXTRACTION PHACO AND INTRAOCULAR LENS PLACEMENT (IOC);  Surgeon: Tonny Branch, MD;  Location: [facility] ORS;  Service: Ophthalmology;  Laterality: Right;  CDE 12.26  . Cataract extraction w/phaco  12/08/2011    Procedure: CATARACT EXTRACTION PHACO AND INTRAOCULAR LENS PLACEMENT (IOC);  Surgeon: Tonny Branch, MD;  Location: [facility]  ORS;  Service: Ophthalmology;  Laterality: Left;  CDE 15.11  . Esophagogastroduodenoscopy N/A 09/01/2013    Procedure: ESOPHAGOGASTRODUODENOSCOPY (EGD);  Surgeon: Rogene [place], MD;  Location: [facility];  Service: Endoscopy;  Laterality: N/A;  830  . Botox injection N/A 09/01/2013    Procedure: BOTOX INJECTION;  Surgeon: Rogene [place], MD;  Location: [facility];  Service: Endoscopy;  Laterality: N/A;  . Abdominal hysterectomy    . Cholecystectomy    . Joint replacement Left 2008  . Tonsillectomy    . Colon surgery    . Eye surgery  2014    catarac surgery  . Appendectomy    . Ovarian cystectomy 1955    . Ileostomy    . Coronary angioplasty    . Rotator cuff repair    . Breast surgery      right breast biopsy  . Liver biopsy and ablation  09/01/14    Current Outpatient Prescriptions  Medication Sig Dispense Refill  . ALPRAZolam (XANAX) 0.5 MG tablet Take 0.5 mg by mouth at bedtime.     Marland Kitchen aspirin EC 325 MG tablet Take 325 mg by mouth daily.    . Calcium Carbonate (CALTRATE 600 PO) Take 1 tablet by mouth daily.     . carvedilol (COREG) 12.5 MG tablet Take 1 tablet (12.5 mg total) by mouth 2 (two) times daily with a meal. (Patient taking differently: Take 18.75 mg by mouth 2 (two) times daily with a meal. ) 180 tablet 3  . ciprofloxacin (CIPRO) 250 MG tablet Take 1 tablet (250 mg total) by mouth 2 (two) times daily. 10 tablet 0  . diphenoxylate-atropine (LOMOTIL) 2.5-0.025 MG tablet Take 1-2 tablets four times a day as needed for loose stools. 60 tablet 2  . doxycycline (VIBRA-TABS) 100 MG tablet Take 1 tablet (100 mg total) by mouth 2 (two) times daily. 60 tablet 1  . fentaNYL (DURAGESIC - DOSED MCG/HR) 25 MCG/HR patch Place 1 patch (25 mcg total) onto the skin every 3 (three) days. 10 patch 0  . fluticasone (FLONASE) 50 MCG/ACT nasal spray Place 2 sprays into both nostrils daily.    Marland Kitchen HYDROcodone-acetaminophen (NORCO) 7.5-325 MG tablet Take 1 tablet by mouth every 4 (four)  hours as needed. 45 tablet 0  . hyoscyamine (LEVSIN SL) 0.125 MG SL tablet Place 1 tablet (0.125 mg total) under the tongue every 6 (six) hours as needed (lower abdominal pain). 90 tablet 2  . levothyroxine (SYNTHROID, LEVOTHROID) 75 MCG tablet Take 75 mcg by mouth daily.     . magnesium oxide (MAG-OX) 400 (241.3 Mg) MG tablet Take 1 tablet (400 mg total) by mouth 4 (four) times daily. 120 tablet 1  . Multiple Vitamins-Minerals (EQL GUMMY ADULT) CHEW Chew 2 tablets by  mouth daily. Reported on 02/06/2015    . NITROSTAT 0.4 MG SL tablet DISSOLVE ONE TABLET UNDER THE TONGUE EVERY 5 MINUTES AS NEEDED FOR CHEST PAIN.  DO NOT EXCEED A TOTAL OF 3 DOSES IN 15 MINUTES 25 tablet 0  . nystatin (MYCOSTATIN/NYSTOP) 100000 UNIT/GM POWD Apply 1 Bottle topically 2 (two) times daily. 60 g 0  . nystatin-triamcinolone (MYCOLOG II) cream Apply 1 application topically 2 (two) times daily. 30 g 1  . Omega-3 Fatty Acids (FISH OIL) 1000 MG CAPS Take 1 capsule by mouth daily. Reported on 02/19/2015    . ondansetron (ZOFRAN ODT) 8 MG disintegrating tablet Take 1 tablet (8 mg total) by mouth every 8 (eight) hours as needed for nausea or vomiting. 30 tablet 2  . Panitumumab (VECTIBIX IV) Inject into the vein. To start 04/20/15. To be given every 2 weeks.    . pantoprazole (PROTONIX) 40 MG tablet Take 40 mg by mouth daily.     . Probiotic Product (PROBIOTIC & ACIDOPHILUS EX ST PO) Take 1 tablet by mouth daily.    . prochlorperazine (COMPAZINE) 10 MG tablet Take 1 tablet (10 mg total) by mouth every 6 (six) hours as needed for nausea or vomiting. 30 tablet 2   No current facility-administered medications for this visit.   Facility-Administered Medications Ordered in Other Visits  Medication Dose Route Frequency Provider Last Rate Last Dose  . 0.9 %  sodium chloride infusion   Intravenous Continuous Patrici Ranks, MD   Stopped at 07/23/15 1515    Allergies:   Bactrim; Codeine; Demerol; Lidocaine hcl; Morphine and related;  Dilaudid; Nitrofurantoin monohyd macro; Lisinopril; and Ramipril   Social History:  The patient  reports that she quit smoking about 52 years ago. Her smoking use included Cigarettes. She has a 3.25 pack-year smoking history. She has never used smokeless tobacco. She reports that she does not drink alcohol or use illicit drugs.   Family History:  The patient's family history includes Arthritis in her brother, father, and mother; Bladder Cancer in her brother, mother, and other; Breast cancer in her other; Colon cancer in her brother, mother, and paternal aunt; Colon polyps in her daughter; Diabetes in her sister; Heart disease in her brother, father, and mother; Hypertension in her sister; Leukemia in her father; Other in her brother, brother, and sister; Prostate cancer in her other; Stroke in her mother, sister, and sister.  ROS:  Please see the history of present illness.  All other systems are reviewed and otherwise negative.   PHYSICAL EXAM:  VS:  BP 136/72 mmHg  Pulse 80  Ht 5' (1.524 m)  Wt 148 lb (67.132 kg)  BMI 28.90 kg/m2 BMI: Body mass index is 28.9 kg/(m^2). Well nourished, well developed WF, in no acute distress HEENT: normocephalic, atraumatic; strabismus noted Neck: no JVD, carotid bruits or masses Cardiac:  normal S1, S2; RRR; no murmurs, rubs, or gallops Lungs:  clear to auscultation bilaterally, no wheezing, rhonchi or rales Abd: soft, nontender, no hepatomegaly, + BS, ileostomy in place MS: no deformity or atrophy Ext: no edema Skin: warm and dry, no rash Neuro:  moves all extremities spontaneously, no focal abnormalities noted, follows commands Psych: euthymic mood, full affect   EKG: not done today. Ecgs reviewed from ED on 07/13/15 personally.  Recent Labs: 07/13/2015: TSH 2.202 07/20/2015: ALT 12* 07/23/2015: BUN 22*; Creatinine, Ser 0.91; Hemoglobin 10.6*; Magnesium 1.2*; Platelets 186; Potassium 4.0; Sodium 141  03/07/2015: Cholesterol 176; HDL 61; LDL Cholesterol  82; Total CHOL/HDL  Ratio 2.9; Triglycerides 167*; VLDL 33   Estimated Creatinine Clearance: 40 mL/min (by C-G formula based on Cr of 0.91).   Wt Readings from Last 3 Encounters:  07/23/15 148 lb (67.132 kg)  07/20/15 147 lb 6.4 oz (66.86 kg)  07/13/15 148 lb 3.2 oz (67.223 kg)     Other studies reviewed: Additional studies/records reviewed today include: summarized above  ASSESSMENT AND PLAN:  1. Recurrent  PSVT. In setting of severe hypomagnesemia. Need to replete magnesium to >2, potassium level OK. Agree with recent increase in Coreg to 18.75 mg bid. If symptoms of tachycardia worsen we can increase beta blocker more. If she develops hypotension may have to switch to a pure beta blocker. Will not resume Dig due to history of bradycardia.  2. Essential HTN - well controlled on Coreg. Watch closely for hypotension. 3. Chronic chest pain/dyspnea - . Prior ischemic evals have been unremarkable.  4. Sinus bradycardia - resolved following discontinuation of digoxin.  5. LBBB - chronic. 6. Colon cancer metastasized to liver, with chronic iron deficiency anemia - per oncology. 7. Hypomagnesemia. Replete per oncology.  Disposition: F/u in 56months.  Current medicines are reviewed at length with the patient today.  The patient did not have any concerns regarding medicines.  Signed, Katherine Tout Martinique MD, [facility]    07/23/2015 6:39 PM     [facility]

## 2015-07-23 NOTE — Patient Instructions (Signed)
Continue your current therapy  We will follow up again in a couple of months.

## 2015-07-26 DIAGNOSIS — H35342 Macular cyst, hole, or pseudohole, left eye: Secondary | ICD-10-CM | POA: Diagnosis not present

## 2015-07-26 DIAGNOSIS — H35073 Retinal telangiectasis, bilateral: Secondary | ICD-10-CM | POA: Diagnosis not present

## 2015-07-27 ENCOUNTER — Ambulatory Visit (HOSPITAL_COMMUNITY): Payer: Medicare Other | Admitting: Hematology & Oncology

## 2015-07-27 ENCOUNTER — Inpatient Hospital Stay (HOSPITAL_COMMUNITY): Payer: Medicare Other

## 2015-07-27 ENCOUNTER — Encounter (HOSPITAL_COMMUNITY): Admission: RE | Admit: 2015-07-27 | Payer: Medicare Other | Source: Ambulatory Visit

## 2015-07-30 DIAGNOSIS — L570 Actinic keratosis: Secondary | ICD-10-CM | POA: Diagnosis not present

## 2015-07-31 ENCOUNTER — Encounter (HOSPITAL_COMMUNITY): Payer: Medicare Other

## 2015-07-31 ENCOUNTER — Telehealth (INDEPENDENT_AMBULATORY_CARE_PROVIDER_SITE_OTHER): Payer: Self-pay | Admitting: *Deleted

## 2015-07-31 ENCOUNTER — Other Ambulatory Visit (HOSPITAL_COMMUNITY): Payer: Self-pay | Admitting: Oncology

## 2015-07-31 DIAGNOSIS — C189 Malignant neoplasm of colon, unspecified: Secondary | ICD-10-CM | POA: Diagnosis not present

## 2015-07-31 DIAGNOSIS — D500 Iron deficiency anemia secondary to blood loss (chronic): Secondary | ICD-10-CM

## 2015-07-31 DIAGNOSIS — D509 Iron deficiency anemia, unspecified: Secondary | ICD-10-CM | POA: Diagnosis not present

## 2015-07-31 LAB — IRON AND TIBC
Iron: 54 ug/dL (ref 28–170)
SATURATION RATIOS: 18 % (ref 10.4–31.8)
TIBC: 294 ug/dL (ref 250–450)
UIBC: 240 ug/dL

## 2015-07-31 LAB — CBC WITH DIFFERENTIAL/PLATELET
Basophils Absolute: 0 10*3/uL (ref 0.0–0.1)
Basophils Relative: 0 %
EOS ABS: 0.1 10*3/uL (ref 0.0–0.7)
EOS PCT: 3 %
HCT: 33.5 % — ABNORMAL LOW (ref 36.0–46.0)
Hemoglobin: 11 g/dL — ABNORMAL LOW (ref 12.0–15.0)
LYMPHS ABS: 1 10*3/uL (ref 0.7–4.0)
Lymphocytes Relative: 23 %
MCH: 30.6 pg (ref 26.0–34.0)
MCHC: 32.8 g/dL (ref 30.0–36.0)
MCV: 93.1 fL (ref 78.0–100.0)
Monocytes Absolute: 0.3 10*3/uL (ref 0.1–1.0)
Monocytes Relative: 7 %
Neutro Abs: 2.8 10*3/uL (ref 1.7–7.7)
Neutrophils Relative %: 67 %
PLATELETS: 180 10*3/uL (ref 150–400)
RBC: 3.6 MIL/uL — AB (ref 3.87–5.11)
RDW: 12.2 % (ref 11.5–15.5)
WBC: 4.2 10*3/uL (ref 4.0–10.5)

## 2015-07-31 LAB — FERRITIN: FERRITIN: 32 ng/mL (ref 11–307)

## 2015-07-31 LAB — SAMPLE TO BLOOD BANK

## 2015-07-31 LAB — MAGNESIUM: Magnesium: 1.1 mg/dL — ABNORMAL LOW (ref 1.7–2.4)
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
## 2015-08-01 ENCOUNTER — Other Ambulatory Visit (HOSPITAL_COMMUNITY): Payer: Self-pay | Admitting: Oncology

## 2015-08-03 ENCOUNTER — Encounter (HOSPITAL_COMMUNITY): Payer: Self-pay

## 2015-08-03 ENCOUNTER — Encounter (HOSPITAL_BASED_OUTPATIENT_CLINIC_OR_DEPARTMENT_OTHER): Payer: Medicare Other

## 2015-08-03 ENCOUNTER — Encounter (HOSPITAL_COMMUNITY): Payer: Medicare Other

## 2015-08-03 VITALS — BP 130/60 | HR 60 | Temp 97.8°F | Resp 18

## 2015-08-03 DIAGNOSIS — Z5112 Encounter for antineoplastic immunotherapy: Secondary | ICD-10-CM

## 2015-08-03 DIAGNOSIS — C787 Secondary malignant neoplasm of liver and intrahepatic bile duct: Secondary | ICD-10-CM | POA: Diagnosis not present

## 2015-08-03 DIAGNOSIS — C189 Malignant neoplasm of colon, unspecified: Secondary | ICD-10-CM

## 2015-08-03 DIAGNOSIS — D500 Iron deficiency anemia secondary to blood loss (chronic): Secondary | ICD-10-CM | POA: Diagnosis not present

## 2015-08-03 MED ORDER — NA FERRIC GLUC CPLX IN SUCROSE 12.5 MG/ML IV SOLN
250.0000 mg | Freq: Once | INTRAVENOUS | Status: AC
  Administered 2015-08-03: 250 mg via INTRAVENOUS
  Filled 2015-08-03: qty 20

## 2015-08-03 MED ORDER — FENTANYL 25 MCG/HR TD PT72: 25.0000 ug | MEDICATED_PATCH | TRANSDERMAL | Status: DC

## 2015-08-03 MED ORDER — SODIUM CHLORIDE 0.9 % IV SOLN
4.9980 mg/kg | Freq: Once | INTRAVENOUS | Status: DC
  Administered 2015-08-03: 340 mg via INTRAVENOUS
  Filled 2015-08-03: qty 17

## 2015-08-03 MED ORDER — MAGNESIUM SULFATE 4 GM/100ML IV SOLN
4.0000 g | Freq: Once | INTRAVENOUS | Status: AC
  Administered 2015-08-03: 4 g via INTRAVENOUS
  Filled 2015-08-03: qty 100

## 2015-08-03 MED ORDER — SODIUM CHLORIDE 0.9 % IV SOLN
Freq: Once | INTRAVENOUS | Status: AC
  Administered 2015-08-03: 11:00:00 via INTRAVENOUS

## 2015-08-03 NOTE — Progress Notes (Signed)
See iron infusion encounter.

## 2015-08-03 NOTE — Progress Notes (Signed)
See chemo encounter 

## 2015-08-03 NOTE — Patient Instructions (Signed)
[facility] Discharge Instructions for Patients Receiving Chemotherapy   Beginning January 23rd 2017 lab work for the [facility] will be done in the  Main lab at [facility] on 1st floor. If you have a lab appointment with the [place] please come in thru the  Main Entrance and check in at the main information desk   Today you received the following chemotherapy agents:  Vectibix You also received a magnesium infusion and an iron infusion. Return as scheduled for treatment and office visit. Call the clinic with any questions or concerns.    If you develop nausea and vomiting, or diarrhea that is not controlled by your medication, call the clinic.  The clinic phone number is [phone]. Office hours are Monday-Friday 8:30am-5:00pm.  BELOW ARE SYMPTOMS THAT SHOULD BE REPORTED IMMEDIATELY:  *FEVER GREATER THAN 101.0 F  *CHILLS WITH OR WITHOUT FEVER  NAUSEA AND VOMITING THAT IS NOT CONTROLLED WITH YOUR NAUSEA MEDICATION  *UNUSUAL SHORTNESS OF BREATH  *UNUSUAL BRUISING OR BLEEDING  TENDERNESS IN MOUTH AND THROAT WITH OR WITHOUT PRESENCE OF ULCERS  *URINARY PROBLEMS  *BOWEL PROBLEMS  UNUSUAL RASH Items with * indicate a potential emergency and should be followed up as soon as possible. If you have an emergency after office hours please contact your primary care physician or go to the nearest emergency department.  Please call the clinic during office hours if you have any questions or concerns.   You may also contact the Patient Navigator at [phone] should you have any questions or need assistance in obtaining follow up care.      Resources For Cancer Patients and their Caregivers ? American Cancer Society: Can assist with transportation, wigs, general needs, runs Look Good Feel Better.        323 790 3107 ? Cancer Care: Provides financial assistance, online support groups, medication/co-pay assistance.  1-800-813-HOPE (248) 529-6615) ? [facility] Assists [place] cancer patients and their families through emotional , educational and financial support.  269-107-6805 ? [place] DSS Where to apply for food stamps, Medicaid and utility assistance. 615-804-6901 ? [employer]ATS: Transportation to medical appointments. 405-029-7208 ? Social Security Administration: May apply for disability if have a Stage IV cancer. ([phone] 984-388-3913 ? [employer], Disability and Transit Services: Assists with nutrition, care and transit needs. 385-071-7378

## 2015-08-10 ENCOUNTER — Ambulatory Visit (HOSPITAL_COMMUNITY): Payer: Medicare Other

## 2015-08-10 ENCOUNTER — Encounter (HOSPITAL_BASED_OUTPATIENT_CLINIC_OR_DEPARTMENT_OTHER): Payer: Medicare Other

## 2015-08-10 VITALS — BP 124/46 | HR 64 | Temp 98.0°F | Resp 18

## 2015-08-10 DIAGNOSIS — C189 Malignant neoplasm of colon, unspecified: Secondary | ICD-10-CM

## 2015-08-10 DIAGNOSIS — D500 Iron deficiency anemia secondary to blood loss (chronic): Secondary | ICD-10-CM

## 2015-08-10 DIAGNOSIS — N183 Chronic kidney disease, stage 3 (moderate): Secondary | ICD-10-CM | POA: Diagnosis not present

## 2015-08-10 DIAGNOSIS — C787 Secondary malignant neoplasm of liver and intrahepatic bile duct: Secondary | ICD-10-CM

## 2015-08-10 DIAGNOSIS — D509 Iron deficiency anemia, unspecified: Secondary | ICD-10-CM | POA: Diagnosis not present

## 2015-08-10 LAB — MAGNESIUM: MAGNESIUM: 1.6 mg/dL — AB (ref 1.7–2.4)

## 2015-08-10 MED ORDER — SODIUM CHLORIDE 0.9 % IV SOLN
Freq: Once | INTRAVENOUS | Status: AC
  Administered 2015-08-10: 11:00:00 via INTRAVENOUS

## 2015-08-10 MED ORDER — SODIUM CHLORIDE 0.9 % IV SOLN
250.0000 mg | Freq: Once | INTRAVENOUS | Status: AC
  Administered 2015-08-10: 250 mg via INTRAVENOUS
  Filled 2015-08-10: qty 20

## 2015-08-10 NOTE — Patient Instructions (Signed)
[facility] Discharge Instructions  RECOMMENDATIONS MADE BY THE CONSULTANT AND ANY TEST RESULTS WILL BE SENT TO YOUR REFERRING PHYSICIAN.  IV iron today.    Thank you for choosing  Cancer Center at [facility] to provide your oncology and hematology care.  To afford each patient quality time with our provider, please arrive at least 15 minutes before your scheduled appointment time.   Beginning January 23rd 2017 lab work for the Cancer Center will be done in the  Main lab at [facility] on 1st floor. If you have a lab appointment with the Cancer Center please come in thru the  Main Entrance and check in at the main information desk  You need to re-schedule your appointment should you arrive 10 or more minutes late.  We strive to give you quality time with our providers, and arriving late affects you and other patients whose appointments are after yours.  Also, if you no show three or more times for appointments you may be dismissed from the clinic at the providers discretion.     Again, thank you for choosing [facility].  Our hope is that these requests will decrease the amount of time that you wait before being seen by our physicians.       _____________________________________________________________  Should you have questions after your visit to [facility], please contact our office at [phone] between the hours of 8:30 a.m. and 4:30 p.m.  Voicemails left after 4:30 p.m. will not be returned until the following business day.  For prescription refill requests, have your pharmacy contact our office.         Resources For Cancer Patients and their Caregivers ? American Cancer Society: Can assist with transportation, wigs, general needs, runs Look Good Feel Better.        1-888-227-6333 ? Cancer Care: Provides financial assistance, online support groups, medication/co-pay assistance.  1-800-813-HOPE  (4673) ? [facility] Assists [place] cancer patients and their families through emotional , educational and financial support.  336-427-4357 ? [place] DSS Where to apply for food stamps, Medicaid and utility assistance. 336-342-1394 ? [employer]: Transportation to medical appointments. 336-347-2287 ? Social Security Administration: May apply for disability if have a Stage IV cancer. 336-342-7796 1-800-772-1213 ? [place] [facility], Disability and Transit Services: Assists with nutrition, care and transit needs. 336-349-2343  Cancer Center Support Programs: @[email or web address]@ > Cancer Support Group  2nd Tuesday of the month 1pm-2pm, [name]  > Creative Journey  3rd Tuesday of the month 1130am-1pm, [name]  > Look Good Feel Better  1st Wednesday of the month 10am-12 noon, [name] (Call American Cancer Society to register 1-800-395-5775)    

## 2015-08-10 NOTE — Progress Notes (Signed)
Patient tolerated infusion well,  VSS.  Labs drawn from IV site as ordered.  Patient understands that we will call her with results when available.

## 2015-08-11 LAB — CEA: CEA: 159.3 ng/mL — AB (ref 0.0–4.7)

## 2015-08-17 ENCOUNTER — Encounter (HOSPITAL_COMMUNITY): Payer: Medicare Other | Attending: Hematology and Oncology | Admitting: Hematology & Oncology

## 2015-08-17 ENCOUNTER — Encounter (HOSPITAL_BASED_OUTPATIENT_CLINIC_OR_DEPARTMENT_OTHER): Payer: Medicare Other

## 2015-08-17 ENCOUNTER — Inpatient Hospital Stay (HOSPITAL_COMMUNITY): Payer: Medicare Other

## 2015-08-17 ENCOUNTER — Encounter (HOSPITAL_COMMUNITY): Payer: Self-pay | Admitting: Hematology & Oncology

## 2015-08-17 VITALS — BP 143/58 | HR 66 | Temp 97.6°F | Resp 18 | Wt 140.0 lb

## 2015-08-17 DIAGNOSIS — Z5111 Encounter for antineoplastic chemotherapy: Secondary | ICD-10-CM

## 2015-08-17 DIAGNOSIS — C787 Secondary malignant neoplasm of liver and intrahepatic bile duct: Secondary | ICD-10-CM

## 2015-08-17 DIAGNOSIS — R239 Unspecified skin changes: Secondary | ICD-10-CM

## 2015-08-17 DIAGNOSIS — C189 Malignant neoplasm of colon, unspecified: Secondary | ICD-10-CM

## 2015-08-17 DIAGNOSIS — T451X5A Adverse effect of antineoplastic and immunosuppressive drugs, initial encounter: Secondary | ICD-10-CM

## 2015-08-17 DIAGNOSIS — D509 Iron deficiency anemia, unspecified: Secondary | ICD-10-CM | POA: Insufficient documentation

## 2015-08-17 DIAGNOSIS — M25559 Pain in unspecified hip: Secondary | ICD-10-CM | POA: Diagnosis not present

## 2015-08-17 DIAGNOSIS — R234 Changes in skin texture: Secondary | ICD-10-CM

## 2015-08-17 LAB — MAGNESIUM: MAGNESIUM: 1.3 mg/dL — AB (ref 1.7–2.4)

## 2015-08-17 LAB — CBC WITH DIFFERENTIAL/PLATELET
BASOS ABS: 0 10*3/uL (ref 0.0–0.1)
Basophils Relative: 0 %
Eosinophils Absolute: 0.3 10*3/uL (ref 0.0–0.7)
Eosinophils Relative: 6 %
HEMATOCRIT: 34.7 % — AB (ref 36.0–46.0)
Hemoglobin: 11 g/dL — ABNORMAL LOW (ref 12.0–15.0)
Lymphocytes Relative: 26 %
Lymphs Abs: 1.1 10*3/uL (ref 0.7–4.0)
MCH: 29.3 pg (ref 26.0–34.0)
MCHC: 31.7 g/dL (ref 30.0–36.0)
MCV: 92.3 fL (ref 78.0–100.0)
MONO ABS: 0.5 10*3/uL (ref 0.1–1.0)
MONOS PCT: 11 %
NEUTROS ABS: 2.4 10*3/uL (ref 1.7–7.7)
Neutrophils Relative %: 57 %
Platelets: 165 10*3/uL (ref 150–400)
RBC: 3.76 MIL/uL — AB (ref 3.87–5.11)
RDW: 12.5 % (ref 11.5–15.5)
WBC: 4.3 10*3/uL (ref 4.0–10.5)

## 2015-08-17 LAB — COMPREHENSIVE METABOLIC PANEL
ALK PHOS: 59 U/L (ref 38–126)
ALT: 14 U/L (ref 14–54)
AST: 24 U/L (ref 15–41)
Albumin: 3.6 g/dL (ref 3.5–5.0)
Anion gap: 4 — ABNORMAL LOW (ref 5–15)
BILIRUBIN TOTAL: 0.5 mg/dL (ref 0.3–1.2)
BUN: 20 mg/dL (ref 6–20)
CALCIUM: 8.8 mg/dL — AB (ref 8.9–10.3)
CO2: 30 mmol/L (ref 22–32)
CREATININE: 0.99 mg/dL (ref 0.44–1.00)
Chloride: 107 mmol/L (ref 101–111)
GFR, EST AFRICAN AMERICAN: 59 mL/min — AB (ref >60)
GFR, EST NON AFRICAN AMERICAN: 51 mL/min — AB (ref >60)
Glucose, Bld: 97 mg/dL (ref 65–99)
Potassium: 4.3 mmol/L (ref 3.5–5.1)
Sodium: 141 mmol/L (ref 135–145)
TOTAL PROTEIN: 6.7 g/dL (ref 6.5–8.1)

## 2015-08-17 MED ORDER — HYDROCODONE-ACETAMINOPHEN 7.5-325 MG PO TABS: 1.0000 | ORAL_TABLET | ORAL | Status: DC | PRN

## 2015-08-17 MED ORDER — SODIUM CHLORIDE 0.9 % IV SOLN
Freq: Once | INTRAVENOUS | Status: AC
Start: 2015-08-17 — End: 2015-08-17
  Administered 2015-08-17: 11:00:00 via INTRAVENOUS

## 2015-08-17 MED ORDER — SODIUM CHLORIDE 0.9% FLUSH: 10.0000 mL | INTRAVENOUS | Status: DC | PRN

## 2015-08-17 MED ORDER — MAGNESIUM SULFATE 4 GM/100ML IV SOLN
4.0000 g | Freq: Once | INTRAVENOUS | Status: AC
Start: 2015-08-17 — End: 2015-08-17
  Administered 2015-08-17: 4 g via INTRAVENOUS
  Filled 2015-08-17: qty 100

## 2015-08-17 MED ORDER — SODIUM CHLORIDE 0.9 % IV SOLN
4.9980 mg/kg | Freq: Once | INTRAVENOUS | Status: AC
  Administered 2015-08-17: 340 mg via INTRAVENOUS
  Filled 2015-08-17: qty 17

## 2015-08-17 NOTE — Patient Instructions (Signed)
[facility] Discharge Instructions for Patients Receiving Chemotherapy   Beginning January 23rd 2017 lab work for the [facility] will be done in the  Main lab at [facility] on 1st floor. If you have a lab appointment with the [facility] please come in thru the  Main Entrance and check in at the main information desk   Today you received the following chemotherapy agents vetibex and magnesium Follow up as scheduled  Please call the clinic if you have any questions or concerns   To help prevent nausea and vomiting after your treatment, we encourage you to take your nausea medication    If you develop nausea and vomiting, or diarrhea that is not controlled by your medication, call the clinic.  The clinic phone number is [phone]. Office hours are Monday-Friday 8:30am-5:00pm.  BELOW ARE SYMPTOMS THAT SHOULD BE REPORTED IMMEDIATELY:  *FEVER GREATER THAN 101.0 F  *CHILLS WITH OR WITHOUT FEVER  NAUSEA AND VOMITING THAT IS NOT CONTROLLED WITH YOUR NAUSEA MEDICATION  *UNUSUAL SHORTNESS OF BREATH  *UNUSUAL BRUISING OR BLEEDING  TENDERNESS IN MOUTH AND THROAT WITH OR WITHOUT PRESENCE OF ULCERS  *URINARY PROBLEMS  *BOWEL PROBLEMS  UNUSUAL RASH Items with * indicate a potential emergency and should be followed up as soon as possible. If you have an emergency after office hours please contact your primary care physician or go to the nearest emergency department.  Please call the clinic during office hours if you have any questions or concerns.   You may also contact the Patient Navigator at [phone] should you have any questions or need assistance in obtaining follow up care.      Resources For Cancer Patients and their Caregivers ? American Cancer Society: Can assist with transportation, wigs, general needs, runs Look Good Feel Better.        385-773-5250 ? Cancer Care: Provides financial assistance, online support groups, medication/co-pay  assistance.  1-800-813-HOPE (870)796-5036) ? [facility] Assists [place] cancer patients and their families through emotional , educational and financial support.  (770) 561-3678 ? [place] DSS Where to apply for food stamps, Medicaid and utility assistance. (480) 381-2776 ? [employer]: Transportation to medical appointments. 319 133 4505 ? Social Security Administration: May apply for disability if have a Stage IV cancer. 330-438-5079 (380)268-1387 ? [employer], Disability and Transit Services: Assists with nutrition, care and transit needs. (801)189-7841

## 2015-08-17 NOTE — Progress Notes (Signed)
[name], MD [place] / [place] [address]   DIAGNOSIS: Colon carcinoma with permanent ostomy Diversion Colitis Achalasia Iron deficiency, anemia secondary to GI related blood loss Hemeoccult positive stools Rising CEA with CT imaging on 07/13/2014 1.3 cm mass in L hepatic lobe Subcapsular mass in anterior pole of L kidney suspicious for RCC Cutaneous microwave ablation of her now biopsy-proven metastatic colonic adenocarcinoma to the left liver 09/01/2014   Colon cancer metastasized to liver [facility])   04/12/1987 Definitive Surgery Dr. Lindalou Hose at [facility]   09/01/2014 Pathology Results Liver, needle/core biopsy - METASTATIC ADENOCARCINOMA.   09/01/2014 Miscellaneous MWA left liver lesion, [name] (IR)   01/18/2015 Progression PET scan   01/18/2015 PET scan Recurrence of  tumor within segment 2 of the liver. There are 2 new lesions identified within segment 5 and segment 6 of the liver worrisome for additional sites of metastasis. Evidence of tumor recurrence at the intra colonic anastomosis is noted...   01/28/2015 - 04/16/2015 Chemotherapy Xeloda 1500 mg BID 7 days on and 7 days off beginning in December 2016.   04/12/2015 Imaging CT C/A/P progression of disease, enlargement of metastatic lesion in segments 2/3 of liver, development of mass with colon at junction of descending colon and sigmoid, indeterminate lesion in lower pole of the left kidney   04/20/2015 -  Antibody Plan Vectibix every 2 weeks, single-agent.   06/12/2015 - 06/13/2015 Hospital Admission Hypotension, diarrhea, ARF, UTI   06/15/2015 Adverse Reaction Vectibix rash   06/15/2015 Treatment Plan Change Vectibix reduced to 5 mg/m2   07/13/2015 Treatment Plan Change Treatment deferred due to hypomagnesemia and palpitations.  Escorted to ED.   CURRENT THERAPY:  Vectibix IV iron prn  History of Present Illness: Karen [name] returns today for further  follow-up of stage IV CRC s/p microwave ablation of a solitary liver lesion. Unfortunately repeat imaging in December revealed other liver disease and recurrence at the prior anastomotic site.  She also has a L renal lesion that is under observation. She has other health issues including chronic LBP. She has progressed through [place]. She is currently on Vectibix.  Karen [name] returns to the [facility] today to receive treatment. She is in a back room in a treatment chair receiving therapy, and she is accompanied by her daughter.  She confirms taking magnesium at home. She notes that she's been taking vitamins with 100 mg magnesium in them, and eating almond butter as well.  In general, when asked how she's feeling, she notes some pain in her belly. She specifically comments on her belly as having "gas pains." She notes that this isn't happening anymore; intermittent.  She notes that she's not eating much. When asked about the diarrhea in her bag, she notes "I think it's a little better. It's not as much all the time. Sometimes, it's a lot." she notes that, at those times, she's using imodium and lomotil.   Her daughter notes that she drinks all the time; she is doing that. Though she adds that Karen [name]'s appetite hasn't been as good as it has been.  Karen [name] remarks that sometimes when she gets hungry, she gets kind of nauseated; but she's never sure when it's going to hit her. It doesn't happen every time. She also comments on her erratic heart rate and hip pain. She notes that she thinks that her hip is just "bone rubbing against bone." Her  daughter confirms "she's had a whole lot of pain with that hip." She notes also feeling like "it's almost like somebody just hit me in my ribs," like she'd pulled a muscle, especially if she "moved a certain way." She says this pain comes and goes.  She says she had "splits" in her fingers and confirms getting wiry eyebrows due to the  medication.  During the physical examination, she notes "it's tender" in her abdomen when palpated.   MEDICAL HISTORY: Past Medical History  Diagnosis Date  . Chronic diastolic CHF (congestive heart failure) (HCC)     a. nl EF by echo 05/2009.  Marland Kitchen DVT of deep femoral vein ([place])   . History of PSVT (paroxysmal supraventricular tachycardia)   . Hypertension   . Abdominal adhesions   . Ileostomy present ([place])   . Colon polyp   . Chest pain     a. 2002 Cath: nl cors;  b. 2009 aden mv: nl;  c. 05/2009 Echo: nl. d. 2014: normal nuclear stress test, EF 69%.  . Wears dentures   . Breast lump   . Hyperlipidemia   . Thyroid disease   . Anemia   . History of blood clots   . Hernia   . Blood transfusion   . Hearing loss   . Arthritis   . GERD (gastroesophageal reflux disease)   . Cancer ([place])     bladder  . Colon cancer ([place])     a. recurrence 2016 with liver mets -  percutaneous liver biopsy and thermal ablation of a liver metastasis by interventional radiology..  . Iron deficiency anemia due to chronic blood loss 07/06/2013  . UTI (urinary tract infection)   . Obstruction of intestine or colon ([place]) 06/2014  . Diabetes mellitus without complication ([place]) 3/87/56    borderline type II; takes no medication for it  . Vision abnormalities 2016    not going blind but having retina problems; might lose ability to see faces  . LBBB (left bundle branch block)   . Asthma     hx of  . Depression   . CKD (chronic kidney disease), stage III     pt. states decreased kidney function  . Headache   . Fibromyalgia   . Achalasia     a. s/p botox injection for achalasia.  . Sinus bradycardia     a. HR 30s in 08/2014 requiring holding of digoxin.  . Family history of colon cancer   . Family history of bladder cancer   . Family history of breast cancer     has Left bundle branch block; Chest pain, exertional; Benign hypertensive heart disease without heart failure; Hypothyroid; Small bowel  obstruction, partial ([place]); Dysphagia; Paroxysmal SVT (supraventricular tachycardia) ([place]); Ileostomy status ([place]); Unstable angina ([place]); Parastomal hernia; Iron deficiency anemia due to chronic blood loss; Malabsorption of iron; Acute abdominal pain; Acute renal failure ([place]); Hyperglycemia; UTI (urinary tract infection); Small bowel obstruction ([place]); Abdominal pain, acute; Liver mass, left lobe; Colon cancer metastasized to liver [facility]); Liver lesion; Sinus bradycardia; Family history of colon cancer; Family history of bladder cancer; Family history of breast cancer; Genetic testing; Counseling regarding end of life decision making; Neoplasm related pain; AKI (acute kidney injury) ([place]); Urinary tract infection; Hypotension; Diarrhea; Elevated troponin; CKD (chronic kidney disease) stage 3, GFR 30-59 ml/min; and Low blood magnesium on her problem list.     is allergic to bactrim; codeine; demerol; lidocaine hcl; morphine and related; dilaudid; nitrofurantoin monohyd macro; lisinopril; and ramipril.  Iron Infusion in March 2016. Mammogram performed in 2015. Reported multiple hernias.   SURGICAL HISTORY: Past Surgical History  Procedure Laterality Date  . Cardiac catheterization  12/10/2000    THE LEFT VENTRICLE IS MILDY DILATED. THERE IS MILD TO MODERATE DIFFUSE HYPOKINESIS WITH EF 35%  . Colon cancer surgery    . Ileostomy    . Esophagogastroduodenoscopy  02/13/2011    Procedure: ESOPHAGOGASTRODUODENOSCOPY (EGD);  Surgeon: Rogene Houston, MD;  Location: [facility];  Service: Endoscopy;  Laterality: N/A;  1030  . Colonoscopy  09/26/2011    Procedure: COLONOSCOPY;  Surgeon: Rogene Houston, MD;  Location: [facility];  Service: Endoscopy;  Laterality: N/A;  215  . Cataract extraction w/phaco  11/13/2011    Procedure: CATARACT EXTRACTION PHACO AND INTRAOCULAR LENS PLACEMENT (IOC);  Surgeon: Tonny Branch, MD;  Location: [facility] ORS;  Service: Ophthalmology;  Laterality: Right;  CDE 12.26  . Cataract  extraction w/phaco  12/08/2011    Procedure: CATARACT EXTRACTION PHACO AND INTRAOCULAR LENS PLACEMENT (IOC);  Surgeon: Tonny Branch, MD;  Location: [facility];  Service: Ophthalmology;  Laterality: Left;  CDE 15.11  . Esophagogastroduodenoscopy N/A 09/01/2013    Procedure: ESOPHAGOGASTRODUODENOSCOPY (EGD);  Surgeon: Rogene Houston, MD;  Location: [facility];  Service: Endoscopy;  Laterality: N/A;  830  . Botox injection N/A 09/01/2013    Procedure: BOTOX INJECTION;  Surgeon: Rogene Houston, MD;  Location: [facility];  Service: Endoscopy;  Laterality: N/A;  . Abdominal hysterectomy    . Cholecystectomy    . Joint replacement Left 2008  . Tonsillectomy    . Colon surgery    . Eye surgery  2014    catarac surgery  . Appendectomy    . Ovarian cystectomy 1955    . Ileostomy    . Coronary angioplasty    . Rotator cuff repair    . Breast surgery      right breast biopsy  . Liver biopsy and ablation  09/01/14    SOCIAL HISTORY: Social History   Social History  . Marital Status: Married    Spouse Name: N/A  . Number of Children: 4  . Years of Education: N/A   Occupational History  . Not on file.   Social History Main Topics  . Smoking status: Former Smoker -- 0.25 packs/day for 13 years    Types: Cigarettes    Quit date: 02/11/1963  . Smokeless tobacco: Never Used  . Alcohol Use: No  . Drug Use: No  . Sexual Activity: Not on file   Other Topics Concern  . Not on file   Social History Narrative   Reads inspirational books Has 60 Great-Grandchildren and 39 Grandchildren  Father died 52 Mother died 77 Oldest brother died 77  FAMILY HISTORY: Family History  Problem Relation Age of Onset  . Stroke    . Hypertension    . Heart disease Mother   . Stroke Mother     deceased  . Arthritis Mother   . Bladder Cancer Mother     dx in her 8s  . Colon cancer Mother   . Arthritis Father   . Heart disease Father     decesaed  . Leukemia Father   . Stroke Sister      alive/debilitated  . Hypertension Sister   . Other Sister     paralysis  . Heart disease Brother     bypass surgery  . Arthritis Brother   . Colon cancer Brother   .  Bladder Cancer Brother   . Stroke Sister     alive/debilitated  . Diabetes Sister   . Other Brother     stomach problems  . Other Brother     bladder  . Colon cancer Paternal Aunt   . Bladder Cancer Other     dx twice in her 30s-40s  . Breast cancer Other     great niece dx in her early 57s  . Prostate cancer Other     newphew  . Colon polyps Daughter      Review of Systems  Constitutional: Negative for fever, chills, weight loss and malaise/fatigue.  HENT: Positive for hearing loss. Negative for congestion, nosebleeds, sore throat and tinnitus.   Eyes: Negative for blurred vision, double vision, pain and discharge.  Respiratory: Negative for cough, hemoptysis, sputum production, shortness of breath and wheezing.   Cardiovascular: Negative for chest pain, palpitations, claudication, leg swelling and PND.  Gastrointestinal:  Negative for heartburn, abdominal pain, diarrhea, constipation, blood in stool and melena. ALTERED TASTE Genitourinary: Negative for dysuria, urgency, frequency and hematuria.  Musculoskeletal: Positive for joint pain. Negative for myalgias and falls.  Skin: EGFR therapy related changes Neurological: Positive for weakness. Negative for dizziness, tingling, tremors, sensory change, speech change, focal weakness, seizures, loss of consciousness and headaches.  Endo/Heme/Allergies: Does not bruise/bleed easily.  Psychiatric/Behavioral: Negative for depression, suicidal ideas, memory loss and substance abuse. The patient is not nervous/anxious and does not have insomnia.   14 point review of systems was performed and is negative except as detailed under history of present illness and above    PHYSICAL EXAMINATION Vitals - 1 value per visit 05/13/5954  SYSTOLIC 387  DIASTOLIC 58  Pulse 66   Temperature 97.6  Respirations 18  Weight (lb) 140  Height   BMI 27.34    ECOG PERFORMANCE STATUS: 1 - Symptomatic but completely ambulatory   Physical Exam  Constitutional: She is oriented to person, place, and time and well-developed, well-nourished, and in no distress. HENT:  Head: Normocephalic and atraumatic. R ear with small deformity from prior surgery Nose: Nose normal.  Mouth/Throat: Oropharynx is clear and moist. No oropharyngeal exudate.  Eyes: Conjunctivae and EOM are normal. Pupils are equal, round, and reactive to light. Right eye exhibits no discharge. Left eye exhibits no discharge. No scleral icterus.  Neck: Normal range of motion. Neck supple. No tracheal deviation present. No thyromegaly present.  Cardiovascular: Normal rate, regular rhythm and normal heart sounds.  Exam reveals no gallop and no friction rub.   No murmur heard. Pulmonary/Chest: Effort normal and breath sounds normal. She has no wheezes. She has no rales.  Abdominal: Soft. Bowel sounds are normal. . There is no tenderness. There is no rebound and no guarding. ostomy site is intact with watery dark stool. Hernia noted. Incision sites all WNL Musculoskeletal: Normal range of motion. She exhibits no edema.  Lymphadenopathy:    She has no cervical adenopathy.  Neurological: She is alert and oriented to person, place, and time. She has normal reflexes. No cranial nerve deficit.  Skin: Skin is warm and dry. Mild vectibix facial rash. Mild associated nail changes/periungual splitting (grade 1)  Psychiatric: Mood, memory, affect and judgment normal.  Nursing note and vitals reviewed.   LABORATORY DATA: I have reviewed the data as listed. CBC    Component Value Date/Time   WBC 4.3 08/17/2015 0940   RBC 3.76* 08/17/2015 0940   RBC 3.45* 11/24/2008 1500   HGB 11.0* 08/17/2015 0940  HCT 34.7* 08/17/2015 0940   PLT 165 08/17/2015 0940   MCV 92.3 08/17/2015 0940   MCH 29.3 08/17/2015 0940   MCHC 31.7  08/17/2015 0940   RDW 12.5 08/17/2015 0940   LYMPHSABS 1.1 08/17/2015 0940   MONOABS 0.5 08/17/2015 0940   EOSABS 0.3 08/17/2015 0940   BASOSABS 0.0 08/17/2015 0940   CMP     Component Value Date/Time   NA 141 08/17/2015 0940   K 4.3 08/17/2015 0940   CL 107 08/17/2015 0940   CO2 30 08/17/2015 0940   GLUCOSE 97 08/17/2015 0940   BUN 20 08/17/2015 0940   CREATININE 0.99 08/17/2015 0940   CREATININE 0.95* 12/07/2014 1512   CALCIUM 8.8* 08/17/2015 0940   PROT 6.7 08/17/2015 0940   ALBUMIN 3.6 08/17/2015 0940   AST 24 08/17/2015 0940   ALT 14 08/17/2015 0940   ALKPHOS 59 08/17/2015 0940   BILITOT 0.5 08/17/2015 0940   GFRNONAA 51* 08/17/2015 0940   GFRNONAA 56* 12/07/2014 1512   GFRAA 59* 08/17/2015 0940   GFRAA 64 12/07/2014 1512   Results for Kellenberger, Lily H (MRN [id number])   Ref. Range 06/01/2015 09:44 06/15/2015 11:04 06/29/2015 08:58 07/13/2015 11:41 08/10/2015 15:26 08/17/2015 09:40  CEA Latest Ref Range: 0.0 - 4.7 ng/mL 161.0 (H) 142.4 (H) 138.2 (H) 120.3 (H) 159.3 (H) 209.6 (H)   Results for AIKAM, HELLICKSON (MRN [id number])   Ref. Range 07/13/2015 11:41 07/16/2015 11:51 07/20/2015 09:15 07/23/2015 11:45 07/31/2015 13:42 08/10/2015 15:26 08/17/2015 09:40  Magnesium Latest Ref Range: 1.7 - 2.4 mg/dL 1.1 (L) 1.2 (L) 1.2 (L) 1.2 (L) 1.1 (L) 1.6 (L) 1.3 (L)    RADIOLOGY: I have reviewed the images detailed below and agree with the results:  Study Result     CLINICAL DATA: 82 year old female with history of colon cancer with metastatic disease to the liver. Currently undergoing chemotherapy. Followup study.  EXAM: CT CHEST, ABDOMEN, AND PELVIS WITH CONTRAST  TECHNIQUE: Multidetector CT imaging of the chest, abdomen and pelvis was performed following the standard protocol during bolus administration of intravenous contrast.  CONTRAST: 29m OMNIPAQUE IOHEXOL 300 MG/ML SOLN  COMPARISON: Multiple priors, most recently PET-CT 01/18/2015.  FINDINGS: CT CHEST  FINDINGS  Mediastinum/Lymph Nodes: Heart size is mildly enlarged. There is no significant pericardial fluid, thickening or pericardial calcification. There is atherosclerosis of the thoracic aorta, the great vessels of the mediastinum and the coronary arteries, including calcified atherosclerotic plaque in the left anterior descending and left circumflex coronary arteries. Calcifications of the mitral annulus. No pathologically enlarged mediastinal or hilar lymph nodes. Mild circumferential thickening of the distal half of the esophagus. No discrete esophageal mass identified. No axillary lymphadenopathy.  Lungs/Pleura: A few scattered tiny sub cm subpleural nodules are noted, largest of which is a 3 mm nodule in the posterior aspect of the left lower lobe. These are unchanged, presumably a benign subpleural lymph nodes. No other larger more suspicious appearing pulmonary nodules or masses are noted to suggest metastatic disease to the lungs. Mild scarring in the right lower lobe. No acute consolidative airspace disease. No pleural effusions.  Musculoskeletal/Soft Tissues: There are no aggressive appearing lytic or blastic lesions noted in the visualized portions of the skeleton.  CT ABDOMEN AND PELVIS FINDINGS  Hepatobiliary: Interval increase in size of a hypovascular heterogeneously enhancing lesion in segments 2 and 3 of the liver, which currently measures 3.1 x 2.0 x 2.3 cm (axial image 48 of series 7 and coronal image 21 of series 9). There is also a  a 7 mm well-defined low-attenuation lesion in the central aspect of segment 7 of the liver, too small to definitively characterize, but similar to the prior study, likely tiny cysts. No new hepatic lesions are noted. No intra or extrahepatic biliary ductal dilatation. Status post cholecystectomy.  Pancreas: Fatty infiltration in the head of the pancreas, similar prior studies. No pancreatic mass. No pancreatic ductal  dilatation. No pancreatic or peripancreatic fluid or inflammatory changes.  Spleen: Unremarkable.  Adrenals/Urinary Tract: In the anterior aspect of the lower pole of the left kidney there is a well-circumscribed 1.1 x 1.4 cm lesion (image 33 of series 2) which measures 62 HU on precontrast images, 63 HU on arterial phase images, 79 HU on portal venous phase images, and 68 HU on delayed images. This lesion is only slightly larger than prior study 07/13/2014, at which point this measured 12 x 9 mm. 1.8 cm low-attenuation lesion in the interpolar region of the right kidney is compatible with a simple cyst. Sub cm low-attenuation lesion in the interpolar region of the left kidney is too small to definitively characterize, but is likely a cyst. No hydroureteronephrosis or perinephric stranding. Urinary bladder is obscured by beam hardening artifact from the patient's left hip arthroplasty. Bilateral adrenal glands are normal in appearance.  Stomach/Bowel: Normal appearance of the stomach. No pathologic dilatation of small bowel or remaining colon. Status post right hemicolectomy with right lower quadrant ileostomy. Laxity of the lower right anterior abdominal wall. Small parastomal hernia containing several loops of small bowel. Notably, although the remaining portions of transverse, descending, sigmoid colon and rectum are decompressed, in the left lower quadrant (image 85 of series 7) there is a new soft tissue mass which appears to be intimately associated with the colon at the junction of descending colon and sigmoid colon, which measures approximately 2.9 x 4.1 x 4.5 cm, concerning for new colonic neoplasm. The possibility of a peritoneal implant in this region is not entirely excluded.  Vascular/Lymphatic: Atherosclerosis throughout the abdominal and pelvic vasculature, without evidence of aneurysm or dissection. No lymphadenopathy noted in the abdomen or  pelvis.  Reproductive: Status post hysterectomy. Ovaries are not confidently identified may be surgically absent or atrophic (much of the pelvis is obscured by beam hardening artifact from the patient's left hip prosthesis).  Other: No significant volume of ascites. No pneumoperitoneum.  Musculoskeletal: Status post left total hip arthroplasty. There are no aggressive appearing lytic or blastic lesions noted in the visualized portions of the skeleton.  IMPRESSION: 1. Progression of disease as evidenced by enlargement of the metastatic lesion in segments 2/3 of the liver. 2. In addition, there has been interval development of a 2.9 x 4.1 x 4.5 cm mass which appears intimately associated with the remaining portion of the colon at the junction of the descending colon and sigmoid colon. This appears to arise from this portion of the colon and is favored to represent a new colonic neoplasm. The possibility of a solitary peritoneal implant in this region is not entirely excluded. This could be further evaluated with colonoscopy if clinically appropriate. 3. No signs of metastatic disease to the thorax. 4. 1.1 x 1.4 cm indeterminate lesion in the lower pole of the left kidney. This is favored to represent a proteinaceous cyst with apparent pseudoenhancement on today's examination secondary to the small size of the lesion. However, this lesion was mildly complex on prior MRI of the abdomen 12/08/2014. Strictly speaking, the possibility of a small slow-growing cystic neoplasm is not entirely  excluded. Close attention on followup studies is recommended. 5. Circumferential thickening of the distal half of the esophagus. This may simply reflect esophagitis, however, if there is any clinical concern for Barrett's metaplasia or esophageal neoplasia, further evaluation with endoscopy should be considered. 6. Mild cardiomegaly. 7. Atherosclerosis, including 2 vessel coronary artery  disease. 8. Additional incidental findings, as above.   Electronically Signed  By: Vinnie Langton M.D.  On: 04/12/2015 15:18    ASSESSMENT and THERAPY PLAN:  Colon carcinoma with permanent ostomy Diversion Colitis Achalasia Iron deficiency, anemia secondary to GI related blood loss Hemeoccult positive stools Rising CEA with CT imaging on 07/13/2014 1.3 cm mass in L hepatic lobe Subcapsular mass in anterior pole of L kidney suspicious for RCC Biopsy of L liver lesion and MWA Left liver lesion 09/01/2014 Taste Alteration KRAS WT XELODA therapy, week on, week off started 01/2015 Progressive disease Difficulty discussing end of life decision making [facility] admission for diarrhea ? Vectibix induced Hypomagnesemia secondary to Vectibix therapy  We again reviewed side effects of the multiple therapy options for stage IV CRC. She would not do well with FOLFOX, FOLFIRI, I also do not believe she would tolerate single agent irinotecan.  I again reviewed side effects of vectibix including diarrhea and advised her that there are no dose modifications for diarrhea. I did dose adjust her however. She for now seems to be doing ok. I have emphasized the importance of notifying us or coming to the ED if she cannot stop her diarrhea with immodium.  We discussed magnesium and the importance of normal magnesium levels. She does not like to take magnesium. I have advised her to take one twice daily. She will receive IV magnesium today. She will return next week for repeat magnesium level. We will continue to give her IV magnesium as needed.  We discussed end of life issues again, she remains resistant. I unfortunately do not think she will ever come to terms with end of life issues or DNR status.  Last CEA was up, if it continues to climb we will re-image.  If disease has progressed. She has few options, I have discussed this with her many times.  She needs a refill on her pain  medicine.  All questions were answered. The patient knows to call the clinic with any problems, questions or concerns. We can certainly see the patient much sooner if necessary.  This document serves as a record of services personally performed by Ancil Linsey, MD. It was created on her behalf by Toni Amend, a trained medical scribe. The creation of this record is based on the scribe's personal observations and the provider's statements to them. This document has been checked and approved by the attending provider.  I have reviewed the above documentation for accuracy and completeness, and I agree with the above.  This document was electronically signed.   Kelby Fam. Whitney Muse, MD

## 2015-08-17 NOTE — Progress Notes (Signed)
Karen [name] Tolerated chemotherapy well today Discharged via wheelchair

## 2015-08-17 NOTE — Patient Instructions (Signed)
[place] at [facility] Discharge Instructions  RECOMMENDATIONS MADE BY THE CONSULTANT AND ANY TEST RESULTS WILL BE SENT TO YOUR REFERRING PHYSICIAN.  You were seen by Dr. Whitney Muse today. Return on Tuesday, July 11th for lab work to check your magnesium levels. Return to clinic in 2 weeks for chemo treatment, lab work and follow-up appointment. Call clinic with any questions or concerns.   Thank you for choosing [place] at [facility] to provide your oncology and hematology care.  To afford each patient quality time with our provider, please arrive at least 15 minutes before your scheduled appointment time.   Beginning January 23rd 2017 lab work for the [name] will be done in the  Main lab at [facility] on 1st floor. If you have a lab appointment with the [place] please come in thru the  Main Entrance and check in at the main information desk  You need to re-schedule your appointment should you arrive 10 or more minutes late.  We strive to give you quality time with our providers, and arriving late affects you and other patients whose appointments are after yours.  Also, if you no show three or more times for appointments you may be dismissed from the clinic at the providers discretion.     Again, thank you for choosing [facility].  Our hope is that these requests will decrease the amount of time that you wait before being seen by our physicians.       _____________________________________________________________  Should you have questions after your visit to [facility], please contact our office at [phone] between the hours of 8:30 a.m. and 4:30 p.m.  Voicemails left after 4:30 p.m. will not be returned until the following business day.  For prescription refill requests, have your pharmacy contact our office.         Resources For Cancer Patients and their Caregivers ? American Cancer  Society: Can assist with transportation, wigs, general needs, runs Look Good Feel Better.        ([phone] ? Cancer Care: Provides financial assistance, online support groups, medication/co-pay assistance.  1-800-813-HOPE (712)663-9916) ? [place] Assists [place] cancer patients and their families through emotional , educational and financial support.  707-428-5115 ? [place] DSS Where to apply for food stamps, Medicaid and utility assistance. 909 600 7424 ? RCATS: Transportation to medical appointments. 805 241 4785 ? Social Security Administration: May apply for disability if have a Stage IV cancer. 3850581960 985-757-1067 ? [employer], Disability and Transit Services: Assists with nutrition, care and transit needs. [place] Support Programs: @10RELATIVEDAYS @ > Cancer Support Group  2nd Tuesday of the month 1pm-2pm, [name]  > Creative Journey  3rd Tuesday of the month 1130am-1pm, [name]  > Look Good Feel Better  1st Wednesday of the month 10am-12 noon, [name] (Call [place] to register [phone])

## 2015-08-18 LAB — CEA: CEA: 209.6 ng/mL — AB (ref 0.0–4.7)

## 2015-08-20 ENCOUNTER — Ambulatory Visit (HOSPITAL_COMMUNITY): Payer: Medicare Other | Admitting: Hematology & Oncology

## 2015-08-21 ENCOUNTER — Encounter (HOSPITAL_COMMUNITY): Payer: Medicare Other

## 2015-08-21 ENCOUNTER — Other Ambulatory Visit (HOSPITAL_COMMUNITY): Payer: Self-pay | Admitting: Oncology

## 2015-08-21 DIAGNOSIS — C787 Secondary malignant neoplasm of liver and intrahepatic bile duct: Principal | ICD-10-CM

## 2015-08-21 DIAGNOSIS — C189 Malignant neoplasm of colon, unspecified: Secondary | ICD-10-CM | POA: Diagnosis not present

## 2015-08-21 DIAGNOSIS — D509 Iron deficiency anemia, unspecified: Secondary | ICD-10-CM | POA: Diagnosis not present

## 2015-08-21 LAB — MAGNESIUM: Magnesium: 1.3 mg/dL — ABNORMAL LOW (ref 1.7–2.4)

## 2015-08-23 ENCOUNTER — Encounter (HOSPITAL_COMMUNITY)
Admission: RE | Admit: 2015-08-23 | Discharge: 2015-08-23 | Disposition: A | Discharge status: Home / Self Care | Payer: Medicare Other | Source: Ambulatory Visit | Attending: Hematology & Oncology | Admitting: Hematology & Oncology

## 2015-08-23 MED ORDER — MAGNESIUM SULFATE 2 GM/50ML IV SOLN
2.0000 g | Freq: Once | INTRAVENOUS | Status: AC
  Administered 2015-08-23: 2 g via INTRAVENOUS
  Filled 2015-08-23: qty 50

## 2015-08-23 MED ORDER — SODIUM CHLORIDE 0.9 % IV SOLN
INTRAVENOUS | Status: DC
  Administered 2015-08-23: 200 mL via INTRAVENOUS

## 2015-08-31 ENCOUNTER — Encounter (HOSPITAL_BASED_OUTPATIENT_CLINIC_OR_DEPARTMENT_OTHER): Payer: Medicare Other | Admitting: Oncology

## 2015-08-31 ENCOUNTER — Encounter (HOSPITAL_COMMUNITY): Payer: Self-pay | Admitting: Oncology

## 2015-08-31 ENCOUNTER — Encounter (HOSPITAL_COMMUNITY): Payer: Medicare Other

## 2015-08-31 ENCOUNTER — Encounter (HOSPITAL_BASED_OUTPATIENT_CLINIC_OR_DEPARTMENT_OTHER): Payer: Medicare Other

## 2015-08-31 VITALS — BP 124/52 | HR 68 | Resp 16 | Wt 142.0 lb

## 2015-08-31 VITALS — BP 129/52 | HR 59 | Temp 97.7°F | Resp 16

## 2015-08-31 DIAGNOSIS — C189 Malignant neoplasm of colon, unspecified: Secondary | ICD-10-CM

## 2015-08-31 DIAGNOSIS — Z5111 Encounter for antineoplastic chemotherapy: Secondary | ICD-10-CM

## 2015-08-31 DIAGNOSIS — C787 Secondary malignant neoplasm of liver and intrahepatic bile duct: Principal | ICD-10-CM

## 2015-08-31 DIAGNOSIS — D509 Iron deficiency anemia, unspecified: Secondary | ICD-10-CM | POA: Diagnosis not present

## 2015-08-31 DIAGNOSIS — R790 Abnormal level of blood mineral: Secondary | ICD-10-CM

## 2015-08-31 DIAGNOSIS — R197 Diarrhea, unspecified: Secondary | ICD-10-CM | POA: Diagnosis not present

## 2015-08-31 DIAGNOSIS — G4700 Insomnia, unspecified: Secondary | ICD-10-CM | POA: Diagnosis not present

## 2015-08-31 LAB — CBC WITH DIFFERENTIAL/PLATELET
BASOS PCT: 0 %
Basophils Absolute: 0 10*3/uL (ref 0.0–0.1)
EOS ABS: 0.2 10*3/uL (ref 0.0–0.7)
EOS PCT: 6 %
HCT: 34.4 % — ABNORMAL LOW (ref 36.0–46.0)
HEMOGLOBIN: 11.1 g/dL — AB (ref 12.0–15.0)
Lymphocytes Relative: 29 %
Lymphs Abs: 1.1 10*3/uL (ref 0.7–4.0)
MCH: 29.6 pg (ref 26.0–34.0)
MCHC: 32.3 g/dL (ref 30.0–36.0)
MCV: 91.7 fL (ref 78.0–100.0)
MONOS PCT: 12 %
Monocytes Absolute: 0.5 10*3/uL (ref 0.1–1.0)
NEUTROS PCT: 53 %
Neutro Abs: 1.9 10*3/uL (ref 1.7–7.7)
PLATELETS: 146 10*3/uL — AB (ref 150–400)
RBC: 3.75 MIL/uL — ABNORMAL LOW (ref 3.87–5.11)
RDW: 12.7 % (ref 11.5–15.5)
WBC: 3.7 10*3/uL — AB (ref 4.0–10.5)

## 2015-08-31 LAB — COMPREHENSIVE METABOLIC PANEL
ALBUMIN: 3.6 g/dL (ref 3.5–5.0)
ALK PHOS: 61 U/L (ref 38–126)
ALT: 14 U/L (ref 14–54)
AST: 22 U/L (ref 15–41)
BUN: 21 mg/dL — AB (ref 6–20)
CHLORIDE: 109 mmol/L (ref 101–111)
CO2: 30 mmol/L (ref 22–32)
Calcium: 8.7 mg/dL — ABNORMAL LOW (ref 8.9–10.3)
Creatinine, Ser: 0.89 mg/dL (ref 0.44–1.00)
GFR calc non Af Amer: 58 mL/min — ABNORMAL LOW (ref >60)
GLUCOSE: 101 mg/dL — AB (ref 65–99)
POTASSIUM: 4.1 mmol/L (ref 3.5–5.1)
SODIUM: 140 mmol/L (ref 135–145)
TOTAL PROTEIN: 6.7 g/dL (ref 6.5–8.1)
Total Bilirubin: 0.4 mg/dL (ref 0.3–1.2)

## 2015-08-31 LAB — MAGNESIUM: Magnesium: 1.3 mg/dL — ABNORMAL LOW (ref 1.7–2.4)

## 2015-08-31 MED ORDER — MAGNESIUM OXIDE 400 (241.3 MG) MG PO TABS: 1.0000 | ORAL_TABLET | Freq: Four times a day (QID) | ORAL | Status: DC

## 2015-08-31 MED ORDER — SODIUM CHLORIDE 0.9 % IV SOLN
4.9980 mg/kg | Freq: Once | INTRAVENOUS | Status: AC
  Administered 2015-08-31: 340 mg via INTRAVENOUS
  Filled 2015-08-31: qty 17

## 2015-08-31 MED ORDER — MAGNESIUM SULFATE 4 GM/100ML IV SOLN
4.0000 g | Freq: Once | INTRAVENOUS | Status: AC
  Administered 2015-08-31: 4 g via INTRAVENOUS
  Filled 2015-08-31: qty 100

## 2015-08-31 MED ORDER — SODIUM CHLORIDE 0.9% FLUSH
10.0000 mL | INTRAVENOUS | Status: DC | PRN
  Administered 2015-08-31: 10 mL
  Filled 2015-08-31: qty 10

## 2015-08-31 MED ORDER — HEPARIN SOD (PORK) LOCK FLUSH 100 UNIT/ML IV SOLN: 500.0000 [IU] | Freq: Once | INTRAVENOUS | Status: DC | PRN

## 2015-08-31 MED ORDER — DIPHENOXYLATE-ATROPINE 2.5-0.025 MG PO TABS: ORAL_TABLET | ORAL | Status: DC

## 2015-08-31 MED ORDER — FENTANYL 25 MCG/HR TD PT72: 25.0000 ug | MEDICATED_PATCH | TRANSDERMAL | Status: DC

## 2015-08-31 MED ORDER — DOXYCYCLINE HYCLATE 100 MG PO TABS: 100.0000 mg | ORAL_TABLET | Freq: Two times a day (BID) | ORAL | Status: DC

## 2015-08-31 MED ORDER — SODIUM CHLORIDE 0.9 % IV SOLN
Freq: Once | INTRAVENOUS | Status: AC
  Administered 2015-08-31: 10:00:00 via INTRAVENOUS

## 2015-08-31 NOTE — Progress Notes (Signed)
[name], MD [address] [place] [address]  Colon cancer metastasized to liver [facility]) - Plan: fentaNYL (DURAGESIC - DOSED MCG/HR) 25 MCG/HR patch, doxycycline (VIBRA-TABS) 100 MG tablet, CT Abdomen Pelvis W Contrast, CT Chest W Contrast  Low blood magnesium - Plan: magnesium oxide (MAG-OX) 400 (241.3 Mg) MG tablet  Diarrhea, unspecified type - Plan: diphenoxylate-atropine (LOMOTIL) 2.5-0.025 MG tablet  CURRENT THERAPY: Vectubix every 2 weeks.  INTERVAL HISTORY: Karen [name] 82 y.o. female returns for followup of Stage IV CRC, KRAS wild-type, S/P microwave ablation of a solitary liver lesion on 09/01/2014.  S/P palliative Xeloda 1500 mg BID 7 days on and 7 days off beginning 01/2015 with progression of disease identified in March 2017 resulting in a discontinuation in [place] therapy.  Now on Vectibix every 2 weeks beginning on 04/20/2015 with a dose reduction on 06/15/2015.    Colon cancer metastasized to liver [facility])   04/12/1987 Definitive Surgery    Dr. Lindalou Hose at [facility]     09/01/2014 Pathology Results    Liver, needle/core biopsy - METASTATIC ADENOCARCINOMA.     09/01/2014 Miscellaneous    MWA left liver lesion, [name] (IR)     01/18/2015 Progression    PET scan     01/18/2015 PET scan    Recurrence of  tumor within segment 2 of the liver. There are 2 new lesions identified within segment 5 and segment 6 of the liver worrisome for additional sites of metastasis. Evidence of tumor recurrence at the intra colonic anastomosis is noted...     01/28/2015 - 04/16/2015 Chemotherapy    Xeloda 1500 mg BID 7 days on and 7 days off beginning in December 2016.     04/12/2015 Imaging    CT C/A/P progression of disease, enlargement of metastatic lesion in segments 2/3 of liver, development of mass with colon at junction of descending colon and sigmoid, indeterminate lesion in lower pole of the left kidney     04/20/2015 -  Antibody Plan    Vectibix every 2  weeks, single-agent.     06/12/2015 - 06/13/2015 Hospital Admission    Hypotension, diarrhea, ARF, UTI     06/15/2015 Adverse Reaction    Vectibix rash     06/15/2015 Treatment Plan Change    Vectibix reduced to 5 mg/m2     07/13/2015 Treatment Plan Change    Treatment deferred due to hypomagnesemia and palpitations.  Escorted to ED.     She has number of typical complaints.  1. Continues with paroxysmal palpitations. She has seen her cardiologist who recently increased her Coreg. Her hypomagnesemia continues to be difficult to manage. She is taking magnesium at home by mouth. We continue to try her IV magnesium as indicated.  2. "Rash on face." This is a side effect of treatment. It is extremely minor but the patient continues to exaggerate how this affects her quality of life. She has been using doxycycline. As mentioned above, her rash is very minimal.  3. She continues with non-specific abdominal discomfort. He comes and goes and resolves spontaneously. She notes right upper quadrant abdominal pain along with left lower quadrant abdominal pain.  4. "I have a difficult time catching a deep breath." She denies any chest pain or pleuritic pain. She denies any shortness of breath.  5. Continued diarrhea. She is on an antidiarrhea regimen. She notes that this has been effective for her.  6. Insomnia. She may increase her alprazolam to  2 tablets at bedtime. I will refrain from prescription strength sleeping aids at this time.  7. Nonspecific migratory pain that goes from unilateral back pain to flank pain, 2 right shoulder.  She notes that it comes and goes. It occurs on random days. It is not present every day. She will go days without having this discomfort. It resolves spontaneously.  Review of Systems  Constitutional: Negative for fever and chills.  HENT: Negative.   Eyes: Negative.   Respiratory: Negative.  Negative for cough and shortness of breath.   Cardiovascular: Positive for  palpitations. Negative for chest pain.  Gastrointestinal: Positive for diarrhea. Negative for heartburn, nausea and vomiting.  Genitourinary: Negative.   Musculoskeletal: Positive for back pain. Negative for falls.  Skin: Positive for itching (Secondary to rash, mild) and rash (Vectibix-induced, very minimal).  Neurological: Negative.  Negative for dizziness.  Endo/Heme/Allergies: Negative.   Psychiatric/Behavioral: Negative.     Past Medical History:  Diagnosis Date  . Abdominal adhesions   . Achalasia    a. s/p botox injection for achalasia.  . Anemia   . Arthritis   . Asthma    hx of  . Blood transfusion   . Breast lump   . Cancer ([place])    bladder  . Chest pain    a. 2002 Cath: nl cors;  b. 2009 aden mv: nl;  c. 05/2009 Echo: nl. d. 2014: normal nuclear stress test, EF 69%.  . Chronic diastolic CHF (congestive heart failure) (HCC)    a. nl EF by echo 05/2009.  . CKD (chronic kidney disease), stage III    pt. states decreased kidney function  . Colon cancer ([place])    a. recurrence 2016 with liver mets -  percutaneous liver biopsy and thermal ablation of a liver metastasis by interventional radiology..  . Colon polyp   . Depression   . Diabetes mellitus without complication ([place]) 9/56/21   borderline type II; takes no medication for it  . DVT of deep femoral vein ([place])   . Family history of bladder cancer   . Family history of breast cancer   . Family history of colon cancer   . Fibromyalgia   . GERD (gastroesophageal reflux disease)   . Headache   . Hearing loss   . Hernia   . History of blood clots   . History of PSVT (paroxysmal supraventricular tachycardia)   . Hyperlipidemia   . Hypertension   . Ileostomy present ([place])   . Iron deficiency anemia due to chronic blood loss 07/06/2013  . LBBB (left bundle branch block)   . Obstruction of intestine or colon ([place]) 06/2014  . Sinus bradycardia    a. HR 30s in 08/2014 requiring holding of digoxin.  Marland Kitchen Thyroid disease     . UTI (urinary tract infection)   . Vision abnormalities 2016   not going blind but having retina problems; might lose ability to see faces  . Wears dentures     Past Surgical History:  Procedure Laterality Date  . ABDOMINAL HYSTERECTOMY    . APPENDECTOMY    . BOTOX INJECTION N/A 09/01/2013   Procedure: BOTOX INJECTION;  Surgeon: Rogene Houston, MD;  Location: [facility] ENDO SUITE;  Service: Endoscopy;  Laterality: N/A;  . BREAST SURGERY     right breast biopsy  . CARDIAC CATHETERIZATION  12/10/2000   THE LEFT VENTRICLE IS MILDY DILATED. THERE IS MILD TO MODERATE DIFFUSE HYPOKINESIS WITH EF 35%  . CATARACT EXTRACTION W/PHACO  11/13/2011  Procedure: CATARACT EXTRACTION PHACO AND INTRAOCULAR LENS PLACEMENT (IOC);  Surgeon: Tonny Branch, MD;  Location: [facility] ORS;  Service: Ophthalmology;  Laterality: Right;  CDE 12.26  . CATARACT EXTRACTION W/PHACO  12/08/2011   Procedure: CATARACT EXTRACTION PHACO AND INTRAOCULAR LENS PLACEMENT (IOC);  Surgeon: Tonny Branch, MD;  Location: [facility] ORS;  Service: Ophthalmology;  Laterality: Left;  CDE 15.11  . CHOLECYSTECTOMY    . COLON CANCER SURGERY    . COLON SURGERY    . COLONOSCOPY  09/26/2011   Procedure: COLONOSCOPY;  Surgeon: Rogene Houston, MD;  Location: [facility] ENDO SUITE;  Service: Endoscopy;  Laterality: N/A;  215  . CORONARY ANGIOPLASTY    . ESOPHAGOGASTRODUODENOSCOPY  02/13/2011   Procedure: ESOPHAGOGASTRODUODENOSCOPY (EGD);  Surgeon: Rogene Houston, MD;  Location: [facility] ENDO SUITE;  Service: Endoscopy;  Laterality: N/A;  1030  . ESOPHAGOGASTRODUODENOSCOPY N/A 09/01/2013   Procedure: ESOPHAGOGASTRODUODENOSCOPY (EGD);  Surgeon: Rogene Houston, MD;  Location: [facility] ENDO SUITE;  Service: Endoscopy;  Laterality: N/A;  830  . EYE SURGERY  2014   catarac surgery  . ILEOSTOMY    . ILEOSTOMY    . JOINT REPLACEMENT Left 2008  . liver biopsy and ablation  09/01/14  . ovarian cystectomy 1955    . ROTATOR CUFF REPAIR    . TONSILLECTOMY      Family History  Problem  Relation Age of Onset  . Stroke    . Hypertension    . Heart disease Mother   . Stroke Mother     deceased  . Arthritis Mother   . Bladder Cancer Mother     dx in her 81s  . Colon cancer Mother   . Arthritis Father   . Heart disease Father     decesaed  . Leukemia Father   . Stroke Sister     alive/debilitated  . Hypertension Sister   . Other Sister     paralysis  . Heart disease Brother     bypass surgery  . Arthritis Brother   . Colon cancer Brother   . Bladder Cancer Brother   . Stroke Sister     alive/debilitated  . Diabetes Sister   . Other Brother     stomach problems  . Other Brother     bladder  . Colon cancer Paternal Aunt   . Bladder Cancer Other     dx twice in her 30s-40s  . Breast cancer Other     great niece dx in her early 75s  . Prostate cancer Other     newphew  . Colon polyps Daughter     Social History   Social History  . Marital status: Married    Spouse name: N/A  . Number of children: 4  . Years of education: N/A   Social History Main Topics  . Smoking status: Former Smoker    Packs/day: 0.25    Years: 13.00    Types: Cigarettes    Quit date: 02/11/1963  . Smokeless tobacco: Never Used  . Alcohol use No  . Drug use: No  . Sexual activity: Not Asked   Other Topics Concern  . None   Social History Narrative  . None     PHYSICAL EXAMINATION  ECOG PERFORMANCE STATUS: 1 - Symptomatic but completely ambulatory  Vitals:   08/31/15 0800  BP: (!) 124/52  Pulse: 68  Resp: 16    GENERAL:alert, no distress, well nourished, well developed, comfortable, cooperative and blunted affect, accompanied by her daughter. SKIN: skin  color, texture, turgor are normal.   HEAD: Normocephalic, No masses, lesions, tenderness or abnormalities EYES: normal, EOMI, Conjunctiva are pink and non-injected EARS: External ears normal OROPHARYNX:lips, buccal mucosa, and tongue normal and mucous membranes are moist  NECK: supple, trachea  midline LYMPH:  no palpable lymphadenopathy BREAST:not examined LUNGS: clear to auscultation  HEART: regular rate & rhythm, S1 normal and S2 normal.   ABDOMEN:abdomen soft and normal bowel sounds BACK: Back symmetric, no curvature. EXTREMITIES:less then 2 second capillary refill, no joint deformities, effusion, or inflammation, no skin discoloration, no cyanosis  NEURO: alert & oriented x 3 with fluent speech, no focal motor/sensory deficits, gait normal   LABORATORY DATA: CBC    Component Value Date/Time   WBC 3.7 (L) 08/31/2015 0810   RBC 3.75 (L) 08/31/2015 0810   HGB 11.1 (L) 08/31/2015 0810   HCT 34.4 (L) 08/31/2015 0810   PLT 146 (L) 08/31/2015 0810   MCV 91.7 08/31/2015 0810   MCH 29.6 08/31/2015 0810   MCHC 32.3 08/31/2015 0810   RDW 12.7 08/31/2015 0810   LYMPHSABS 1.1 08/31/2015 0810   MONOABS 0.5 08/31/2015 0810   EOSABS 0.2 08/31/2015 0810   BASOSABS 0.0 08/31/2015 0810      Chemistry      Component Value Date/Time   NA 140 08/31/2015 0810   K 4.1 08/31/2015 0810   CL 109 08/31/2015 0810   CO2 30 08/31/2015 0810   BUN 21 (H) 08/31/2015 0810   CREATININE 0.89 08/31/2015 0810   CREATININE 0.95 (H) 12/07/2014 1512      Component Value Date/Time   CALCIUM 8.7 (L) 08/31/2015 0810   ALKPHOS 61 08/31/2015 0810   AST 22 08/31/2015 0810   ALT 14 08/31/2015 0810   BILITOT 0.4 08/31/2015 0810     Lab Results  Component Value Date   CEA 188.4 (H) 08/31/2015    PENDING LABS:   RADIOGRAPHIC STUDIES:  No results found.   PATHOLOGY:    ASSESSMENT AND PLAN:  Colon cancer metastasized to liver (HCC) Stage IV CRC, KRAS wild-type, S/P microwave ablation of a solitary liver lesion on 09/01/2014.  S/P palliative Xeloda 1500 mg BID 7 days on and 7 days off beginning 01/2015 with progression of disease identified in March 2017 resulting in a discontinuation in [place] therapy.  Now on Vectibix every 2 weeks beginning on 04/20/2015 with a dose reduction on  06/15/2015.  Oncology history updated.  Labs will be updated today: CBC diff, CMET, Mg, CEA.  I personally reviewed and went over laboratory results with the patient.  The results are noted within this dictation.  CEA is climbing which is concerning for progression/impending progression of disease.  As a result, if CEA is higher today, I will get her set-up for repeat imaging to evaluate her disease burden.  Hypomagnesemia is noted again today. Orders place for 4 g of IV magnesium. She'll return on Monday for repeat magnesium, 4 mg IV.  Code Status has been discussed on multiple occassions and she remains very unrealistic regarding goals of care.  "I want to live as long as I can."  She wants to remain a FULL CODE.  "The good Lord will take me when he is ready, even if I'm on a respiratory."   She is switching pharmacies and therefore, there a re few medications that she would like printed so she can take them to another pharmacy.  The following Rxs are printed:  Fentanyl  Doxycycline  Magnesium  Lomotil   Patient  reports insomnia.  Medication list is reviewed. She is on alprazolam. She will increase to 2 tablets at bedtime if necessary. I will refrain from prescription strength sleeping aids at this time and defer to her primary care physician.  If her CEA is higher today, she will return in 2 weeks for review of imaging studies.  If imaging studies demonstrate progression of disease, we will recommend Hospice.  If CEA level is stable today, then we will continue with treatment as planned and she will return in 4 weeks for follow-up.  Addendum: CEA is returned.  It is stable and improved compared to 2 weeks ago.  As a result, we will get her set-up for CT CAP in ~ 3 weeks.  We will cancel her 2 week follow-up appointment and see her in 4 weeks with treatment and review of CT imaging.     ORDERS PLACED FOR THIS ENCOUNTER: Orders Placed This Encounter  Procedures  . CT Abdomen Pelvis W  Contrast  . CT Chest W Contrast    MEDICATIONS PRESCRIBED THIS ENCOUNTER: Meds ordered this encounter  Medications  . fentaNYL (DURAGESIC - DOSED MCG/HR) 25 MCG/HR patch    Sig: Place 1 patch (25 mcg total) onto the skin every 3 (three) days.    Dispense:  10 patch    Refill:  0    Order Specific Question:   Supervising Provider    Answer:   Patrici Ranks [id number]  . magnesium oxide (MAG-OX) 400 (241.3 Mg) MG tablet    Sig: Take 1 tablet (400 mg total) by mouth 4 (four) times daily.    Dispense:  120 tablet    Refill:  1    Order Specific Question:   Supervising Provider    Answer:   Patrici Ranks [id number]  . doxycycline (VIBRA-TABS) 100 MG tablet    Sig: Take 1 tablet (100 mg total) by mouth 2 (two) times daily.    Dispense:  60 tablet    Refill:  0    Order Specific Question:   Supervising Provider    Answer:   Patrici Ranks [id number]  . diphenoxylate-atropine (LOMOTIL) 2.5-0.025 MG tablet    Sig: Take 1-2 tablets four times a day as needed for loose stools.    Dispense:  60 tablet    Refill:  2    Order Specific Question:   Supervising Provider    Answer:   Patrici Ranks [id number]    THERAPY PLAN:  Will continue with treatment as planned.  All questions were answered. The patient knows to call the clinic with any problems, questions or concerns. We can certainly see the patient much sooner if necessary.  Patient and plan discussed with Dr. Ancil Linsey and she is in agreement with the aforementioned.   This note is electronically signed by: Doy Mince 09/02/2015 6:53 PM

## 2015-08-31 NOTE — Assessment & Plan Note (Addendum)
Stage IV CRC, KRAS wild-type, S/P microwave ablation of a solitary liver lesion on 09/01/2014.  S/P palliative Xeloda 1500 mg BID 7 days on and 7 days off beginning 01/2015 with progression of disease identified in March 2017 resulting in a discontinuation in [place] therapy.  Now on Vectibix every 2 weeks beginning on 04/20/2015 with a dose reduction on 06/15/2015.  Oncology history updated.  Labs will be updated today: CBC diff, CMET, Mg, CEA.  I personally reviewed and went over laboratory results with the patient.  The results are noted within this dictation.  CEA is climbing which is concerning for progression/impending progression of disease.  As a result, if CEA is higher today, I will get her set-up for repeat imaging to evaluate her disease burden.  Hypomagnesemia is noted again today. Orders place for 4 g of IV magnesium. She'll return on Monday for repeat magnesium, 4 mg IV.  Code Status has been discussed on multiple occassions and she remains very unrealistic regarding goals of care.  "I want to live as long as I can."  She wants to remain a FULL CODE.  "The good Lord will take me when he is ready, even if I'm on a respiratory."   She is switching pharmacies and therefore, there a re few medications that she would like printed so she can take them to another pharmacy.  The following Rxs are printed:  Fentanyl  Doxycycline  Magnesium  Lomotil   Patient reports insomnia.  Medication list is reviewed. She is on alprazolam. She will increase to 2 tablets at bedtime if necessary. I will refrain from prescription strength sleeping aids at this time and defer to her primary care physician.  If her CEA is higher today, she will return in 2 weeks for review of imaging studies.  If imaging studies demonstrate progression of disease, we will recommend Hospice.  If CEA level is stable today, then we will continue with treatment as planned and she will return in 4 weeks for follow-up.  Addendum: CEA is  returned.  It is stable and improved compared to 2 weeks ago.  As a result, we will get her set-up for CT CAP in ~ 3 weeks.  We will cancel her 2 week follow-up appointment and see her in 4 weeks with treatment and review of CT imaging.

## 2015-08-31 NOTE — Patient Instructions (Addendum)
[place] at [facility] Discharge Instructions  RECOMMENDATIONS MADE BY THE CONSULTANT AND ANY TEST RESULTS WILL BE SENT TO YOUR REFERRING PHYSICIAN.  Exam done and seen today by Kirby [facility] Treatment today IV Magnesium today. Labs today. Follow up in 2 weeks, and 4 weeks with labs and tx. Call the clinic for any concerns or questions.   Thank you for choosing [place] at [facility] to provide your oncology and hematology care.  To afford each patient quality time with our provider, please arrive at least 15 minutes before your scheduled appointment time.   Beginning January 23rd 2017 lab work for the [name] will be done in the  Main lab at [facility] on 1st floor. If you have a lab appointment with the [facility] please come in thru the  Main Entrance and check in at the main information desk  You need to re-schedule your appointment should you arrive 10 or more minutes late.  We strive to give you quality time with our providers, and arriving late affects you and other patients whose appointments are after yours.  Also, if you no show three or more times for appointments you may be dismissed from the clinic at the providers discretion.     Again, thank you for choosing [facility].  Our hope is that these requests will decrease the amount of time that you wait before being seen by our physicians.       _____________________________________________________________  Should you have questions after your visit to [facility], please contact our office at [phone] between the hours of 8:30 a.m. and 4:30 p.m.  Voicemails left after 4:30 p.m. will not be returned until the following business day.  For prescription refill requests, have your pharmacy contact our office.         Resources For Cancer Patients and their Caregivers ? American Cancer Society: Can assist with transportation, wigs,  general needs, runs Look Good Feel Better.        502-010-3633 ? Cancer Care: Provides financial assistance, online support groups, medication/co-pay assistance.  1-800-813-HOPE (402)242-7445) ? [place] Assists [place] cancer patients and their families through emotional , educational and financial support.  617-120-3418 ? [place] DSS Where to apply for food stamps, Medicaid and utility assistance. (236)784-5850 ? RCATS: Transportation to medical appointments. (309)758-5527 ? Social Security Administration: May apply for disability if have a Stage IV cancer. 325-495-5535 346-351-6969 ? [employer], Disability and Transit Services: Assists with nutrition, care and transit needs. [place] Support Programs: @10RELATIVEDAYS @ > Cancer Support Group  2nd Tuesday of the month 1pm-2pm, [name]  > Creative Journey  3rd Tuesday of the month 1130am-1pm, [name]  > Look Good Feel Better  1st Wednesday of the month 10am-12 noon, [name] (Call [place] to register [phone])

## 2015-08-31 NOTE — Progress Notes (Signed)
Tolerated magnesium infusion and chemotherapy infusion well. Stable on discharge home with family via wheelchair.

## 2015-08-31 NOTE — Patient Instructions (Signed)
[facility] Discharge Instructions for Patients Receiving Chemotherapy   Beginning January 23rd 2017 lab work for the [facility] will be done in the  Main lab at [facility] on 1st floor. If you have a lab appointment with the [facility] please come in thru the  Main Entrance and check in at the main information desk   Today you received the following chemotherapy agents Vectibix.  To help prevent nausea and vomiting after your treatment, we encourage you to take your nausea medication as instructed.  If you develop nausea and vomiting, or diarrhea that is not controlled by your medication, call the clinic.  The clinic phone number is [phone]. Office hours are Monday-Friday 8:30am-5:00pm.  BELOW ARE SYMPTOMS THAT SHOULD BE REPORTED IMMEDIATELY:  *FEVER GREATER THAN 101.0 F  *CHILLS WITH OR WITHOUT FEVER  NAUSEA AND VOMITING THAT IS NOT CONTROLLED WITH YOUR NAUSEA MEDICATION  *UNUSUAL SHORTNESS OF BREATH  *UNUSUAL BRUISING OR BLEEDING  TENDERNESS IN MOUTH AND THROAT WITH OR WITHOUT PRESENCE OF ULCERS  *URINARY PROBLEMS  *BOWEL PROBLEMS  UNUSUAL RASH Items with * indicate a potential emergency and should be followed up as soon as possible. If you have an emergency after office hours please contact your primary care physician or go to the nearest emergency department.  Please call the clinic during office hours if you have any questions or concerns.   You may also contact the Patient Navigator at [phone] should you have any questions or need assistance in obtaining follow up care.      Resources For Cancer Patients and their Caregivers ? American Cancer Society: Can assist with transportation, wigs, general needs, runs Look Good Feel Better.        [phone] ? Cancer Care: Provides financial assistance, online support groups, medication/co-pay assistance.  1-800-813-HOPE 8136163360) ? [place] Assists  [place] cancer patients and their families through emotional , educational and financial support.  (916)471-9167 ? [place] DSS Where to apply for food stamps, Medicaid and utility assistance. 512 730 1155 ? RCATS: Transportation to medical appointments. 306-183-1631 ? Social Security Administration: May apply for disability if have a Stage IV cancer. [phone] 252-681-3624 ? [employer], Disability and Transit Services: Assists with nutrition, care and transit needs. [phone]

## 2015-09-01 LAB — CEA: CEA: 188.4 ng/mL — ABNORMAL HIGH (ref 0.0–4.7)

## 2015-09-03 ENCOUNTER — Telehealth (HOSPITAL_COMMUNITY): Payer: Self-pay

## 2015-09-03 ENCOUNTER — Encounter (HOSPITAL_COMMUNITY)
Admission: RE | Admit: 2015-09-03 | Discharge: 2015-09-03 | Disposition: A | Discharge status: Home / Self Care | Payer: Medicare Other | Source: Ambulatory Visit | Attending: Hematology & Oncology | Admitting: Hematology & Oncology

## 2015-09-03 MED ORDER — MAGNESIUM SULFATE 4 GM/100ML IV SOLN
4.0000 g | Freq: Once | INTRAVENOUS | Status: AC
  Administered 2015-09-03: 4 g via INTRAVENOUS
  Filled 2015-09-03: qty 100

## 2015-09-03 MED ORDER — SODIUM CHLORIDE 0.9 % IV SOLN
INTRAVENOUS | Status: DC
  Administered 2015-09-03: 250 mL via INTRAVENOUS

## 2015-09-03 NOTE — Telephone Encounter (Signed)
-----   Message from [facility], PA-C sent at 09/02/2015  6:33 PM EDT ----- CEA is stable.  CT CAP in ~ 3 weeks.  Orders are in.

## 2015-09-03 NOTE — Telephone Encounter (Signed)
Called to notify patient that her CEA is stable. Spoke with husband, [name]. Also instructed husband that scheduling will call with appointment for CT of CAP. He verbalized understanding.

## 2015-09-11 ENCOUNTER — Ambulatory Visit (INDEPENDENT_AMBULATORY_CARE_PROVIDER_SITE_OTHER): Payer: Medicare Other | Admitting: Physician Assistant

## 2015-09-11 VITALS — BP 113/62 | HR 71 | Ht 60.0 in | Wt 139.4 lb

## 2015-09-11 DIAGNOSIS — R072 Precordial pain: Secondary | ICD-10-CM | POA: Diagnosis not present

## 2015-09-11 DIAGNOSIS — I447 Left bundle-branch block, unspecified: Secondary | ICD-10-CM

## 2015-09-11 DIAGNOSIS — I471 Supraventricular tachycardia: Secondary | ICD-10-CM | POA: Diagnosis not present

## 2015-09-11 MED ORDER — METOPROLOL TARTRATE 25 MG PO TABS
25.0000 mg | ORAL_TABLET | ORAL | 3 refills | Status: DC | PRN
Start: 2015-09-11 — End: 2015-11-06

## 2015-09-11 NOTE — Patient Instructions (Signed)
Medication Instructions:  START Metoprolol tart 25 mg as needed for palpitation  Labwork: NONE  Testing/Procedures: NONE  Follow-Up: Your physician wants you to follow-up in: 6 Months with Dr Martinique. You will receive a reminder letter in the mail two months in advance. If you don't receive a letter, please call our office to schedule the follow-up appointment.  Any Other Special Instructions Will Be Listed Below (If Applicable).   If you need a refill on your cardiac medications before your next appointment, please call your pharmacy.

## 2015-09-11 NOTE — Progress Notes (Signed)
Cardiology Office Note   Date:  09/11/2015   ID:  LAFONDRA HESTERBERG, DOB 09-Jan-1930, MRN [id number]  PCP:  Karen Hilding, MD  Cardiologist:  Dr. Martinique  Lashawndra Lampkins, PA-C   Chief Complaint  Patient presents with  . Follow-up    has occassional chest pain, shortness of breath, edema in ankles, pain and cramping in legs, and lightheaded and dizziness    History of Present Illness: Karen [name] is a 82 y.o. female with a history of PSVT, chronic intermittent chest pain (normal cath 2002, normal nuc 2014), colon cancer with metastasis to liver, HLD, thyroid disease, iron deficiency anemia, achalasia s/p Botox, remote intra-abdominal infection requiring ileostomy, LBBB, sinus bradycardia  Karen [name] presents for Cardiology evaluation  Ms Dauphin struggles with GI issues. She has a low magnesium level related to chemotherapy and diarrhea. She has required IV supplementation for this. She eats poorly. She has regular abdominal pain.  She has a rash on her face and body that is from the chemotherapy. This is being treated with doxycycline which she just recently restarted. The rash is not improved much yet. She is upset about the rash, but is wearing makeup today and it is not extremely visible.  She has occasional palpitations. She has checked her heart rate a couple of times during the palpitations and it was 120-1 time in 129 another time. The palpitations and rapid heart rate will last about 30 minutes and then resolve spontaneously. The palpitations frighten her and bring her husband as well. However, she does not get presyncope and feels the palpitations, but they do not give her chest pain.  She has some chronic dyspnea on exertion that has not changed much recently. Her chest pain is atypical and has not changed much recently. She worries about her husband, his weakness and his medical issues.   Past Medical History:  Diagnosis Date  . Abdominal adhesions   . Achalasia      a. s/p botox injection for achalasia.  . Anemia   . Arthritis   . Asthma    hx of  . Blood transfusion   . Breast lump   . Cancer ([place])    bladder  . Chest pain    a. 2002 Cath: nl cors;  b. 2009 aden mv: nl;  c. 05/2009 Echo: nl. d. 2014: normal nuclear stress test, EF 69%.  . Chronic diastolic CHF (congestive heart failure) (HCC)    a. nl EF by echo 05/2009.  . CKD (chronic kidney disease), stage III    pt. states decreased kidney function  . Colon cancer ([place])    a. recurrence 2016 with liver mets -  percutaneous liver biopsy and thermal ablation of a liver metastasis by interventional radiology..  . Colon polyp   . Depression   . Diabetes mellitus without complication ([place]) 123456   borderline type II; takes no medication for it  . DVT of deep femoral vein ([place])   . Family history of bladder cancer   . Family history of breast cancer   . Family history of colon cancer   . Fibromyalgia   . GERD (gastroesophageal reflux disease)   . Headache   . Hearing loss   . Hernia   . History of blood clots   . History of PSVT (paroxysmal supraventricular tachycardia)   . Hyperlipidemia   . Hypertension   . Ileostomy present ([place])   . Iron deficiency anemia due to chronic blood loss 07/06/2013  .  LBBB (left bundle branch block)   . Obstruction of intestine or colon ([place]) 06/2014  . Sinus bradycardia    a. HR 30s in 08/2014 requiring holding of digoxin.  Marland Kitchen Thyroid disease   . UTI (urinary tract infection)   . Vision abnormalities 2016   not going blind but having retina problems; might lose ability to see faces  . Wears dentures     Past Surgical History:  Procedure Laterality Date  . ABDOMINAL HYSTERECTOMY    . APPENDECTOMY    . BOTOX INJECTION N/A 09/01/2013   Procedure: BOTOX INJECTION;  Surgeon: Rogene Houston, MD;  Location: [facility] ENDO SUITE;  Service: Endoscopy;  Laterality: N/A;  . BREAST SURGERY     right breast biopsy  . CARDIAC CATHETERIZATION  12/10/2000   THE LEFT  VENTRICLE IS MILDY DILATED. THERE IS MILD TO MODERATE DIFFUSE HYPOKINESIS WITH EF 35%  . CATARACT EXTRACTION W/PHACO  11/13/2011   Procedure: CATARACT EXTRACTION PHACO AND INTRAOCULAR LENS PLACEMENT (IOC);  Surgeon: Tonny Branch, MD;  Location: [facility] ORS;  Service: Ophthalmology;  Laterality: Right;  CDE 12.26  . CATARACT EXTRACTION W/PHACO  12/08/2011   Procedure: CATARACT EXTRACTION PHACO AND INTRAOCULAR LENS PLACEMENT (IOC);  Surgeon: Tonny Branch, MD;  Location: [facility] ORS;  Service: Ophthalmology;  Laterality: Left;  CDE 15.11  . CHOLECYSTECTOMY    . COLON CANCER SURGERY    . COLON SURGERY    . COLONOSCOPY  09/26/2011   Procedure: COLONOSCOPY;  Surgeon: Rogene Houston, MD;  Location: [facility] ENDO SUITE;  Service: Endoscopy;  Laterality: N/A;  215  . CORONARY ANGIOPLASTY    . ESOPHAGOGASTRODUODENOSCOPY  02/13/2011   Procedure: ESOPHAGOGASTRODUODENOSCOPY (EGD);  Surgeon: Rogene Houston, MD;  Location: [facility] END[facility] SU[facility];  Service: Endoscopy;  Laterality: N/A;  1030  . ESOPHAGOGASTRODUODENOSCOPY N/A 09/01/2013   Procedure: ESOPHAGOGASTRODUODENOSCOPY (EGD);  Surgeon: Rogene Houston, MD;  Location: [facility] END[facility] SU[facility];  Service: Endoscopy;  Laterality: N/A;  830  . EYE SURGERY  2014   catarac surgery  . ILEOSTOMY    . ILEOSTOMY    . JOINT REPLACEMENT Left 2008  . liver biopsy and ablation  09/01/14  . ovarian cystectomy 1955    . ROTATOR CUFF REPAIR    . TONSILLECTOMY      Current Outpatient Prescriptions  Medication Sig Dispense Refill  . ALPRAZolam (XANAX) 0.5 MG tablet Take 0.5 mg by mouth at bedtime.     Marland Kitchen aspirin EC 325 MG tablet Take 325 mg by mouth daily.    . Calcium Carbonate (CALTRATE 600 PO) Take 1 tablet by mouth daily.     . carvedilol (COREG) 12.5 MG tablet Take 1 tablet (12.5 mg total) by mouth 2 (two) times daily with a meal. (Patient taking differently: Take 18.75 mg by mouth 2 (two) times daily with a meal. ) 180 tablet 3  . diphenoxylate-atropine (LOMOTIL) 2.5-0.025 MG tablet Take 1-2 tablets  four times a day as needed for loose stools. 60 tablet 2  . doxycycline (VIBRA-TABS) 100 MG tablet Take 1 tablet (100 mg total) by mouth 2 (two) times daily. 60 tablet 0  . fentaNYL (DURAGESIC - DOSED MCG/HR) 25 MCG/HR patch Place 1 patch (25 mcg total) onto the skin every 3 (three) days. 10 patch 0  . HYDROcodone-acetaminophen (NORCO) 7.5-325 MG tablet Take 1 tablet by mouth every 4 (four) hours as needed. 100 tablet 0  . hyoscyamine (LEVSIN SL) 0.125 MG SL tablet Place 1 tablet (0.125 mg total) under the tongue every 6 (  six) hours as needed (lower abdominal pain). 90 tablet 2  . levothyroxine (SYNTHROID, LEVOTHROID) 75 MCG tablet Take 75 mcg by mouth daily.     . magnesium oxide (MAG-OX) 400 (241.3 Mg) MG tablet Take 1 tablet (400 mg total) by mouth 4 (four) times daily. 120 tablet 1  . Multiple Vitamins-Minerals (EQL GUMMY ADULT) CHEW Chew 2 tablets by mouth daily. Reported on 02/06/2015    . NITROSTAT 0.4 MG SL tablet DISSOLVE ONE TABLET UNDER THE TONGUE EVERY 5 MINUTES AS NEEDED FOR CHEST PAIN.  DO NOT EXCEED A TOTAL OF 3 DOSES IN 15 MINUTES 25 tablet 0  . nystatin (MYCOSTATIN/NYSTOP) 100000 UNIT/GM POWD Apply 1 Bottle topically 2 (two) times daily. 60 g 0  . nystatin-triamcinolone (MYCOLOG II) cream Apply 1 application topically 2 (two) times daily. 30 g 1  . Omega-3 Fatty Acids (FISH OIL) 1000 MG CAPS Take 1 capsule by mouth daily. Reported on 02/19/2015    . ondansetron (ZOFRAN ODT) 8 MG disintegrating tablet Take 1 tablet (8 mg total) by mouth every 8 (eight) hours as needed for nausea or vomiting. 30 tablet 2  . Panitumumab (VECTIBIX IV) Inject into the vein. To start 04/20/15. To be given every 2 weeks.    . pantoprazole (PROTONIX) 40 MG tablet Take 40 mg by mouth daily.     . Probiotic Product (PROBIOTIC & ACIDOPHILUS EX ST PO) Take 1 tablet by mouth daily.    . prochlorperazine (COMPAZINE) 10 MG tablet Take 1 tablet (10 mg total) by mouth every 6 (six) hours as needed for nausea or  vomiting. 30 tablet 2   No current facility-administered medications for this visit.     Allergies:   Bactrim [sulfamethoxazole-trimethoprim]; Codeine; Demerol; Lidocaine hcl; Morphine and related; Dilaudid [hydromorphone hcl]; Nitrofurantoin monohyd macro; Lisinopril; and Ramipril    Social History:  The patient  reports that she quit smoking about 52 years ago. Her smoking use included Cigarettes. She has a 3.25 pack-year smoking history. She has never used smokeless tobacco. She reports that she does not drink alcohol or use drugs.   Family History:  The patient's family history includes Arthritis in her brother, father, and mother; Bladder Cancer in her brother, mother, and other; Breast cancer in her other; Colon cancer in her brother, mother, and paternal aunt; Colon polyps in her daughter; Diabetes in her sister; Heart disease in her brother, father, and mother; Hypertension in her sister; Leukemia in her father; Other in her brother, brother, and sister; Prostate cancer in her other; Stroke in her mother, sister, and sister.    ROS:  Please see the history of present illness. All other systems are reviewed and negative.    PHYSICAL EXAM: VS:  BP 113/62   Pulse 71   Ht 5' (1.524 m)   Wt 139 lb 6.4 oz (63.2 kg)   BMI 27.22 kg/m  , BMI Body mass index is 27.22 kg/m. GEN: Well nourished, well developed, female in no acute distress  HEENT: normal for age  Neck: no JVD, no carotid bruit, No hepatojugular reflux, no masses Cardiac: RRR; soft murmur, no rubs, or gallops Respiratory: Decreased breath sounds bases but clear to auscultation bilaterally, normal work of breathing GI: soft, nontender, nondistended, + BS MS: no deformity or atrophy; no edema; distal pulses are 2+ in all 4 extremities   Skin: warm and dry, + rash Neuro:  Strength and sensation are intact Psych: euthymic mood, full affect   EKG:  EKG is ordered today. The  ekg ordered today demonstrates sinus rhythm, left  bundle branch block is old   Recent Labs: 07/13/2015: TSH 2.202 08/31/2015: ALT 14; BUN 21; Creatinine, Ser 0.89; Hemoglobin 11.1; Magnesium 1.3; Platelets 146; Potassium 4.1; Sodium 140    Lipid Panel    Component Value Date/Time   CHOL 176 03/07/2015 1356   TRIG 167 (H) 03/07/2015 1356   HDL 61 03/07/2015 1356   CHOLHDL 2.9 03/07/2015 1356   VLDL 33 03/07/2015 1356   LDLCALC 82 03/07/2015 1356     Wt Readings from Last 3 Encounters:  09/11/15 139 lb 6.4 oz (63.2 kg)  08/31/15 142 lb (64.4 kg)  08/17/15 140 lb (63.5 kg)     Other studies Reviewed: Additional studies/ records that were reviewed today include: Office notes, hospital records and testing.  ASSESSMENT AND PLAN:  1.  Palpitations: She become symptomatic with palpitations, having shortness rest. They are resolving in approximately 30 minutes. However, her blood pressure and heart rate are low enough that I am not sure we can increase her Coreg chronically. Therefore, we will add Lopressor 25 mg when necessary daily for palpitations  2. Chest pain: Her symptoms are atypical and not related to exertion. She has a remote negative cath and a normal nuclear study in 2014. No further evaluation is indicated at this time. She is to continue current medications with aspirin, beta blocker, and sublingual nitroglycerin when necessary.   Current medicines are reviewed at length with the patient today.  The patient does not have concerns regarding medicines.  The following changes have been made:  Add when necessary Lopressor 25 mg  Labs/ tests ordered today include:   Orders Placed This Encounter  Procedures  . EKG 12-Lead     Disposition:   FU with Dr. Martinique  Signed, Rosaria Ferries, PA-C  09/11/2015 2:39 PM    [place] Phone: [phone]; Fax: [phone]  This note was written with the assistance of speech recognition software. Please excuse any transcriptional errors.

## 2015-09-14 ENCOUNTER — Encounter (HOSPITAL_COMMUNITY): Payer: Medicare Other | Attending: Hematology and Oncology | Admitting: Oncology

## 2015-09-14 ENCOUNTER — Encounter (HOSPITAL_COMMUNITY): Payer: Medicare Other

## 2015-09-14 ENCOUNTER — Encounter (HOSPITAL_BASED_OUTPATIENT_CLINIC_OR_DEPARTMENT_OTHER): Payer: Medicare Other

## 2015-09-14 ENCOUNTER — Encounter (HOSPITAL_COMMUNITY): Payer: Self-pay | Admitting: Oncology

## 2015-09-14 VITALS — BP 114/59 | HR 74 | Temp 98.7°F | Resp 18 | Wt 138.4 lb

## 2015-09-14 VITALS — BP 128/43 | HR 68 | Temp 97.5°F | Resp 16

## 2015-09-14 DIAGNOSIS — C189 Malignant neoplasm of colon, unspecified: Secondary | ICD-10-CM

## 2015-09-14 DIAGNOSIS — C787 Secondary malignant neoplasm of liver and intrahepatic bile duct: Secondary | ICD-10-CM

## 2015-09-14 DIAGNOSIS — M25551 Pain in right hip: Secondary | ICD-10-CM

## 2015-09-14 DIAGNOSIS — D509 Iron deficiency anemia, unspecified: Secondary | ICD-10-CM | POA: Insufficient documentation

## 2015-09-14 DIAGNOSIS — R110 Nausea: Secondary | ICD-10-CM | POA: Diagnosis not present

## 2015-09-14 DIAGNOSIS — Z5112 Encounter for antineoplastic immunotherapy: Secondary | ICD-10-CM | POA: Diagnosis not present

## 2015-09-14 LAB — COMPREHENSIVE METABOLIC PANEL
ALK PHOS: 72 U/L (ref 38–126)
ALT: 16 U/L (ref 14–54)
AST: 27 U/L (ref 15–41)
Albumin: 3.7 g/dL (ref 3.5–5.0)
Anion gap: 4 — ABNORMAL LOW (ref 5–15)
BILIRUBIN TOTAL: 0.3 mg/dL (ref 0.3–1.2)
BUN: 30 mg/dL — ABNORMAL HIGH (ref 6–20)
CALCIUM: 8.7 mg/dL — AB (ref 8.9–10.3)
CO2: 26 mmol/L (ref 22–32)
CREATININE: 1.15 mg/dL — AB (ref 0.44–1.00)
Chloride: 109 mmol/L (ref 101–111)
GFR calc non Af Amer: 43 mL/min — ABNORMAL LOW (ref >60)
GFR, EST AFRICAN AMERICAN: 50 mL/min — AB (ref >60)
Glucose, Bld: 96 mg/dL (ref 65–99)
Potassium: 5 mmol/L (ref 3.5–5.1)
SODIUM: 139 mmol/L (ref 135–145)
Total Protein: 7.1 g/dL (ref 6.5–8.1)

## 2015-09-14 LAB — CBC WITH DIFFERENTIAL/PLATELET
Basophils Absolute: 0 10*3/uL (ref 0.0–0.1)
Basophils Relative: 0 %
EOS ABS: 0.2 10*3/uL (ref 0.0–0.7)
Eosinophils Relative: 3 %
HEMATOCRIT: 35.2 % — AB (ref 36.0–46.0)
HEMOGLOBIN: 11.4 g/dL — AB (ref 12.0–15.0)
LYMPHS ABS: 0.7 10*3/uL (ref 0.7–4.0)
Lymphocytes Relative: 14 %
MCH: 29.5 pg (ref 26.0–34.0)
MCHC: 32.4 g/dL (ref 30.0–36.0)
MCV: 91.2 fL (ref 78.0–100.0)
Monocytes Absolute: 0.4 10*3/uL (ref 0.1–1.0)
Monocytes Relative: 8 %
NEUTROS ABS: 3.8 10*3/uL (ref 1.7–7.7)
NEUTROS PCT: 75 %
Platelets: 177 10*3/uL (ref 150–400)
RBC: 3.86 MIL/uL — AB (ref 3.87–5.11)
RDW: 12.8 % (ref 11.5–15.5)
WBC: 5.1 10*3/uL (ref 4.0–10.5)

## 2015-09-14 LAB — MAGNESIUM: Magnesium: 1.2 mg/dL — ABNORMAL LOW (ref 1.7–2.4)

## 2015-09-14 MED ORDER — PANITUMUMAB CHEMO INJECTION 100 MG/5ML
4.9980 mg/kg | Freq: Once | INTRAVENOUS | Status: AC
  Administered 2015-09-14: 340 mg via INTRAVENOUS
  Filled 2015-09-14: qty 17

## 2015-09-14 MED ORDER — SODIUM CHLORIDE 0.9 % IV SOLN
INTRAVENOUS | Status: DC
  Administered 2015-09-14: 10:00:00 via INTRAVENOUS

## 2015-09-14 MED ORDER — SODIUM CHLORIDE 0.9% FLUSH
10.0000 mL | INTRAVENOUS | Status: DC | PRN
  Administered 2015-09-14: 10 mL
  Filled 2015-09-14: qty 10

## 2015-09-14 MED ORDER — SODIUM CHLORIDE 0.9 % IV SOLN: Freq: Once | INTRAVENOUS | Status: DC

## 2015-09-14 MED ORDER — MAGNESIUM SULFATE 4 GM/100ML IV SOLN
4.0000 g | Freq: Once | INTRAVENOUS | Status: AC
  Administered 2015-09-14: 4 g via INTRAVENOUS
  Filled 2015-09-14: qty 100

## 2015-09-14 NOTE — Progress Notes (Signed)
Tolerated chemo, magnesium and hydration fluids well. Stable on discharge home with daughter via wheelchair.

## 2015-09-14 NOTE — Patient Instructions (Signed)
[place] at [facility] Discharge Instructions  RECOMMENDATIONS MADE BY THE CONSULTANT AND ANY TEST RESULTS WILL BE SENT TO YOUR REFERRING PHYSICIAN.   Exam and discussion by Kirby Crigler PA today CT scan as scheduled for 09/24/15 Treatment today Treatment in 2 weeks Return to see the doctor as scheduled Please call the clinic if you have any questions or concerns    Thank you for choosing [place] at [facility] to provide your oncology and hematology care.  To afford each patient quality time with our provider, please arrive at least 15 minutes before your scheduled appointment time.   Beginning January 23rd 2017 lab work for the [name] will be done in the  Main lab at [facility] on 1st floor. If you have a lab appointment with the [facility] please come in thru the  Main Entrance and check in at the main information desk  You need to re-schedule your appointment should you arrive 10 or more minutes late.  We strive to give you quality time with our providers, and arriving late affects you and other patients whose appointments are after yours.  Also, if you no show three or more times for appointments you may be dismissed from the clinic at the providers discretion.     Again, thank you for choosing [facility].  Our hope is that these requests will decrease the amount of time that you wait before being seen by our physicians.       _____________________________________________________________  Should you have questions after your visit to [facility], please contact our office at [phone] between the hours of 8:30 a.m. and 4:30 p.m.  Voicemails left after 4:30 p.m. will not be returned until the following business day.  For prescription refill requests, have your pharmacy contact our office.         Resources For Cancer Patients and their Caregivers ? American Cancer Society: Can assist  with transportation, wigs, general needs, runs Look Good Feel Better.        [phone] ? Cancer Care: Provides financial assistance, online support groups, medication/co-pay assistance.  1-800-813-HOPE (774) 437-4342) ? [facility] Assists [employer] [place] cancer patients and their families through emotional , educational and financial support.  ([phone] ? [place] DSS Where to apply for food stamps, Medicaid and utility assistance. ([phone] ? RCATS: Transportation to medical appointments. 203-685-0297 ? Social Security Administration: May apply for disability if have a Stage IV cancer. ([phone] ? [employer], Disability and Transit Services: Assists with nutrition, care and transit needs. [place] Support Programs: @10RELATIVEDAYS @ > Cancer Support Group  2nd Tuesday of the month 1pm-2pm, [name]  > Creative Journey  3rd Tuesday of the month 1130am-1pm, [name]  > Look Good Feel Better  1st Wednesday of the month 10am-12 noon, [name] (Call [facility] to register [phone])

## 2015-09-14 NOTE — Assessment & Plan Note (Addendum)
Stage IV CRC, KRAS wild-type, S/P microwave ablation of a solitary liver lesion on 09/01/2014.  S/P palliative Xeloda 1500 mg BID 7 days on and 7 days off beginning 01/2015 with progression of disease identified in March 2017 resulting in a discontinuation in [place] therapy.  Now on Vectibix every 2 weeks beginning on 04/20/2015 with a dose reduction on 06/15/2015.  Oncology history updated.  Labs will be updated today: CBC diff, CMET, Mg, CEA.  I personally reviewed and went over laboratory results with the patient.  The results are noted within this dictation.  Small increase in creatinine is noted and therefore, I have asked nursing to administer additional IV fluids today.  Magnesium is 1.2 today.  4 grams of IV magnesium was administered in the clinic.  She will return next week for another magnesium check and IV magnesium, 4 grams.  Code Status has been discussed on multiple occassions and she remains very unrealistic regarding goals of care.  "I want to live as long as I can."  She wants to remain a FULL CODE.  "The good Lord will take me when he is ready, even if I'm on a respiratory."   She notes right hip pain.  She has an appointment later this month to see an orthopedist.  She also notes continued nausea.  She has an appointment with Dr. Laural Golden next week.  Current therapy regimen should not be nausea-inducing.  She is to continue with current antiemetic regimen.  CT scans are ordered for next week for restaging.  Return in 2 weeks for follow-up, review of CT imaging results, pre-treatment labs, and treatment if indicated based upon CT imaging.  If progression is noted, she would be a candidate for Hospice intervention as systemic chemotherapy is not a good option in this patient.

## 2015-09-14 NOTE — Progress Notes (Signed)
Karen Hilding, MD [address] [place] [address]  Colon cancer metastasized to liver [facility]) - Plan: Magnesium  CURRENT THERAPY: Vectubix every 2 weeks.  INTERVAL HISTORY: Karen [name] 82 y.o. female returns for followup of Stage IV CRC, KRAS wild-type, S/P microwave ablation of a solitary liver lesion on 09/01/2014.  S/P palliative Xeloda 1500 mg BID 7 days on and 7 days off beginning 01/2015 with progression of disease identified in March 2017 resulting in a discontinuation in [place] therapy.  Now on Vectibix every 2 weeks beginning on 04/20/2015 with a dose reduction on 06/15/2015.    Colon cancer metastasized to liver [facility])   04/12/1987 Definitive Surgery    Dr. Lindalou Hose at [facility]     09/01/2014 Pathology Results    Liver, needle/core biopsy - METASTATIC ADENOCARCINOMA.     09/01/2014 Miscellaneous    MWA left liver lesion, [name] (IR)     01/18/2015 Progression    PET scan     01/18/2015 PET scan    Recurrence of  tumor within segment 2 of the liver. There are 2 new lesions identified within segment 5 and segment 6 of the liver worrisome for additional sites of metastasis. Evidence of tumor recurrence at the intra colonic anastomosis is noted...     01/28/2015 - 04/16/2015 Chemotherapy    Xeloda 1500 mg BID 7 days on and 7 days off beginning in December 2016.     04/12/2015 Imaging    CT C/A/P progression of disease, enlargement of metastatic lesion in segments 2/3 of liver, development of mass with colon at junction of descending colon and sigmoid, indeterminate lesion in lower pole of the left kidney     04/20/2015 -  Antibody Plan    Vectibix every 2 weeks, single-agent.     06/12/2015 - 06/13/2015 Hospital Admission    Hypotension, diarrhea, ARF, UTI     06/15/2015 Adverse Reaction    Vectibix rash     06/15/2015 Treatment Plan Change    Vectibix reduced to 5 mg/m2     07/13/2015 Treatment Plan Change    Treatment deferred due to hypomagnesemia  and palpitations.  Escorted to ED.     She has number of typical complaints.  1. Right hip pain.  She notes that in the early 2000's when she had a left total hip arthroplasty, she was told that her left hip needed to be done as well.  She suspects her pain is secondary to that.  She has an appointment with her orthopedist this month.  2. Nausea without vomiting.  Her weight has declined slightly.  We will need to monitor moving forward.  She notes "I get hungry but I don't know what I want."  She denies any vomiting.  She has an upcoming appointment with Dr. Laural Golden next week.  She is advised that her current regimen should not be nausea-inducing.  Review of Systems  Constitutional: Negative for chills and fever.  HENT: Negative.   Eyes: Negative.   Respiratory: Negative.  Negative for cough and shortness of breath.   Cardiovascular: Negative for chest pain.  Gastrointestinal: Positive for nausea. Negative for heartburn and vomiting.  Genitourinary: Negative.   Musculoskeletal: Positive for back pain and joint pain (right hip). Negative for falls.  Skin: Negative.  Itching: Secondary to rash, mild. Rash: Vectibix-induced, very minimal.  Neurological: Negative.  Negative for dizziness.  Endo/Heme/Allergies: Negative.   Psychiatric/Behavioral: Negative.     Past Medical  History:  Diagnosis Date  . Abdominal adhesions   . Achalasia    a. s/p botox injection for achalasia.  . Anemia   . Arthritis   . Asthma    hx of  . Blood transfusion   . Breast lump   . Cancer ([place])    bladder  . Chest pain    a. 2002 Cath: nl cors;  b. 2009 aden mv: nl;  c. 05/2009 Echo: nl. d. 2014: normal nuclear stress test, EF 69%.  . Chronic diastolic CHF (congestive heart failure) (HCC)    a. nl EF by echo 05/2009.  . CKD (chronic kidney disease), stage III    pt. states decreased kidney function  . Colon cancer ([place])    a. recurrence 2016 with liver mets -  percutaneous liver biopsy and thermal  ablation of a liver metastasis by interventional radiology..  . Colon polyp   . Depression   . Diabetes mellitus without complication ([place]) 5/57/32   borderline type II; takes no medication for it  . DVT of deep femoral vein ([place])   . Family history of bladder cancer   . Family history of breast cancer   . Family history of colon cancer   . Fibromyalgia   . GERD (gastroesophageal reflux disease)   . Headache   . Hearing loss   . Hernia   . History of blood clots   . History of PSVT (paroxysmal supraventricular tachycardia)   . Hyperlipidemia   . Hypertension   . Ileostomy present ([place])   . Iron deficiency anemia due to chronic blood loss 07/06/2013  . LBBB (left bundle branch block)   . Obstruction of intestine or colon ([place]) 06/2014  . Sinus bradycardia    a. HR 30s in 08/2014 requiring holding of digoxin.  Marland Kitchen Thyroid disease   . UTI (urinary tract infection)   . Vision abnormalities 2016   not going blind but having retina problems; might lose ability to see faces  . Wears dentures     Past Surgical History:  Procedure Laterality Date  . ABDOMINAL HYSTERECTOMY    . APPENDECTOMY    . BOTOX INJECTION N/A 09/01/2013   Procedure: BOTOX INJECTION;  Surgeon: Rogene Houston, MD;  Location: [facility] ENDO SUITE;  Service: Endoscopy;  Laterality: N/A;  . BREAST SURGERY     right breast biopsy  . CARDIAC CATHETERIZATION  12/10/2000   THE LEFT VENTRICLE IS MILDY DILATED. THERE IS MILD TO MODERATE DIFFUSE HYPOKINESIS WITH EF 35%  . CATARACT EXTRACTION W/PHACO  11/13/2011   Procedure: CATARACT EXTRACTION PHACO AND INTRAOCULAR LENS PLACEMENT (IOC);  Surgeon: Tonny Branch, MD;  Location: [facility] ORS;  Service: Ophthalmology;  Laterality: Right;  CDE 12.26  . CATARACT EXTRACTION W/PHACO  12/08/2011   Procedure: CATARACT EXTRACTION PHACO AND INTRAOCULAR LENS PLACEMENT (IOC);  Surgeon: Tonny Branch, MD;  Location: [facility] ORS;  Service: Ophthalmology;  Laterality: Left;  CDE 15.11  . CHOLECYSTECTOMY    . COLON  CANCER SURGERY    . COLON SURGERY    . COLONOSCOPY  09/26/2011   Procedure: COLONOSCOPY;  Surgeon: Rogene Houston, MD;  Location: [facility] ENDO SUITE;  Service: Endoscopy;  Laterality: N/A;  215  . CORONARY ANGIOPLASTY    . ESOPHAGOGASTRODUODENOSCOPY  02/13/2011   Procedure: ESOPHAGOGASTRODUODENOSCOPY (EGD);  Surgeon: Rogene Houston, MD;  Location: [facility];  Service: Endoscopy;  Laterality: N/A;  1030  . ESOPHAGOGASTRODUODENOSCOPY N/A 09/01/2013   Procedure: ESOPHAGOGASTRODUODENOSCOPY (EGD);  Surgeon: Rogene Houston, MD;  Location: [facility] ENDO SUITE;  Service: Endoscopy;  Laterality: N/A;  830  . EYE SURGERY  2014   catarac surgery  . ILEOSTOMY    . ILEOSTOMY    . JOINT REPLACEMENT Left 2008  . liver biopsy and ablation  09/01/14  . ovarian cystectomy 1955    . ROTATOR CUFF REPAIR    . TONSILLECTOMY      Family History  Problem Relation Age of Onset  . Heart disease Mother   . Stroke Mother     deceased  . Arthritis Mother   . Bladder Cancer Mother     dx in her 32s  . Colon cancer Mother   . Arthritis Father   . Heart disease Father     decesaed  . Leukemia Father   . Stroke Sister     alive/debilitated  . Hypertension Sister   . Other Sister     paralysis  . Heart disease Brother     bypass surgery  . Arthritis Brother   . Colon cancer Brother   . Bladder Cancer Brother   . Stroke Sister     alive/debilitated  . Diabetes Sister   . Other Brother     stomach problems  . Other Brother     bladder  . Colon cancer Paternal Aunt   . Bladder Cancer Other     dx twice in her 30s-40s  . Breast cancer Other     great niece dx in her early 25s  . Prostate cancer Other     newphew  . Colon polyps Daughter   . Stroke    . Hypertension      Social History   Social History  . Marital status: Married    Spouse name: N/A  . Number of children: 4  . Years of education: N/A   Social History Main Topics  . Smoking status: Former Smoker    Packs/day: 0.25    Years:  13.00    Types: Cigarettes    Quit date: 02/11/1963  . Smokeless tobacco: Never Used  . Alcohol use No  . Drug use: No  . Sexual activity: Not Asked   Other Topics Concern  . None   Social History Narrative  . None     PHYSICAL EXAMINATION  ECOG PERFORMANCE STATUS: 1 - Symptomatic but completely ambulatory  Vitals:   09/14/15 0839  BP: (!) 114/59  Pulse: 74  Resp: 18  Temp: 98.7 F (37.1 C)    GENERAL:alert, no distress, well nourished, well developed, comfortable, cooperative and blunted affect, accompanied by her daughter. SKIN: skin color, texture, turgor are normal.   HEAD: Normocephalic, No masses, lesions, tenderness or abnormalities EYES: normal, EOMI, Conjunctiva are pink and non-injected EARS: External ears normal OROPHARYNX:lips, buccal mucosa, and tongue normal and mucous membranes are moist  NECK: supple, trachea midline LYMPH:  no palpable lymphadenopathy BREAST:not examined LUNGS: clear to auscultation  HEART: regular rate & rhythm, S1 normal and S2 normal.   ABDOMEN:abdomen soft and normal bowel sounds BACK: Back symmetric, no curvature. EXTREMITIES:less then 2 second capillary refill, no joint deformities, effusion, or inflammation, no skin discoloration, no cyanosis  NEURO: alert & oriented x 3 with fluent speech, no focal motor/sensory deficits, gait normal   LABORATORY DATA: CBC    Component Value Date/Time   WBC 5.1 09/14/2015 0809   RBC 3.86 (L) 09/14/2015 0809   HGB 11.4 (L) 09/14/2015 0809   HCT 35.2 (L) 09/14/2015 0809   PLT 177 09/14/2015 0809  MCV 91.2 09/14/2015 0809   MCH 29.5 09/14/2015 0809   MCHC 32.4 09/14/2015 0809   RDW 12.8 09/14/2015 0809   LYMPHSABS 0.7 09/14/2015 0809   MONOABS 0.4 09/14/2015 0809   EOSABS 0.2 09/14/2015 0809   BASOSABS 0.0 09/14/2015 0809      Chemistry      Component Value Date/Time   NA 139 09/14/2015 0809   K 5.0 09/14/2015 0809   CL 109 09/14/2015 0809   CO2 26 09/14/2015 0809   BUN 30  (H) 09/14/2015 0809   CREATININE 1.15 (H) 09/14/2015 0809   CREATININE 0.95 (H) 12/07/2014 1512      Component Value Date/Time   CALCIUM 8.7 (L) 09/14/2015 0809   ALKPHOS 72 09/14/2015 0809   AST 27 09/14/2015 0809   ALT 16 09/14/2015 0809   BILITOT 0.3 09/14/2015 0809     Lab Results  Component Value Date   CEA 188.4 (H) 08/31/2015    PENDING LABS:   RADIOGRAPHIC STUDIES:  No results found.   PATHOLOGY:    ASSESSMENT AND PLAN:  Colon cancer metastasized to liver (HCC) Stage IV CRC, KRAS wild-type, S/P microwave ablation of a solitary liver lesion on 09/01/2014.  S/P palliative Xeloda 1500 mg BID 7 days on and 7 days off beginning 01/2015 with progression of disease identified in March 2017 resulting in a discontinuation in [place] therapy.  Now on Vectibix every 2 weeks beginning on 04/20/2015 with a dose reduction on 06/15/2015.  Oncology history updated.  Labs will be updated today: CBC diff, CMET, Mg, CEA.  I personally reviewed and went over laboratory results with the patient.  The results are noted within this dictation.  Small increase in creatinine is noted and therefore, I have asked nursing to administer additional IV fluids today.  Code Status has been discussed on multiple occassions and she remains very unrealistic regarding goals of care.  "I want to live as long as I can."  She wants to remain a FULL CODE.  "The good Lord will take me when he is ready, even if I'm on a respiratory."   She notes right hip pain.  She has an appointment later this month to see an orthopedist.  She also notes continued nausea.  She has an appointment with Dr. Laural Golden next week.  Current therapy regimen should not be nausea-inducing.  She is to continue with current antiemetic regimen.  CT scans are ordered for next week for restaging.  Return in 2 weeks for follow-up, review of CT imaging results, pre-treatment labs, and treatment if indicated based upon CT imaging.  If progression is  noted, she would be a candidate for Hospice intervention.   ORDERS PLACED FOR THIS ENCOUNTER: Orders Placed This Encounter  Procedures  . Magnesium    MEDICATIONS PRESCRIBED THIS ENCOUNTER: No orders of the defined types were placed in this encounter.   THERAPY PLAN:  Will continue with treatment as planned.  All questions were answered. The patient knows to call the clinic with any problems, questions or concerns. We can certainly see the patient much sooner if necessary.  Patient and plan discussed with Dr. Ancil Linsey and she is in agreement with the aforementioned.   This note is electronically signed by: Robynn Pane, PA-C 09/14/2015 9:07 AM

## 2015-09-14 NOTE — Patient Instructions (Signed)
[facility] Discharge Instructions for Patients Receiving Chemotherapy   Beginning January 23rd 2017 lab work for the [facility] will be done in the  Main lab at [facility] on 1st floor. If you have a lab appointment with the [facility] please come in thru the  Main Entrance and check in at the main information desk   Today you received the following chemotherapy agents vectibix.  To help prevent nausea and vomiting after your treatment, we encourage you to take your nausea medication as instructed. If you develop nausea and vomiting, or diarrhea that is not controlled by your medication, call the clinic.  The clinic phone number is [phone]. Office hours are Monday-Friday 8:30am-5:00pm.  BELOW ARE SYMPTOMS THAT SHOULD BE REPORTED IMMEDIATELY:  *FEVER GREATER THAN 101.0 F  *CHILLS WITH OR WITHOUT FEVER  NAUSEA AND VOMITING THAT IS NOT CONTROLLED WITH YOUR NAUSEA MEDICATION  *UNUSUAL SHORTNESS OF BREATH  *UNUSUAL BRUISING OR BLEEDING  TENDERNESS IN MOUTH AND THROAT WITH OR WITHOUT PRESENCE OF ULCERS  *URINARY PROBLEMS  *BOWEL PROBLEMS  UNUSUAL RASH Items with * indicate a potential emergency and should be followed up as soon as possible. If you have an emergency after office hours please contact your primary care physician or go to the nearest emergency department.  Please call the clinic during office hours if you have any questions or concerns.   You may also contact the Patient Navigator at [phone] should you have any questions or need assistance in obtaining follow up care.      Resources For Cancer Patients and their Caregivers ? American Cancer Society: Can assist with transportation, wigs, general needs, runs Look Good Feel Better.        628-293-7289 ? Cancer Care: Provides financial assistance, online support groups, medication/co-pay assistance.  1-800-813-HOPE (936)762-4437) ? [facility] Assists  [place] cancer patients and their families through emotional , educational and financial support.  564 862 2526 ? [place] DSS Where to apply for food stamps, Medicaid and utility assistance. 408-800-2471 ? [facility]: Transportation to medical appointments. 680 454 8576 ? Social Security Administration: May apply for disability if have a Stage IV cancer. 5041941353 860-565-1801 ? [employer], Disability and Transit Services: Assists with nutrition, care and transit needs. ([phone])303-0659

## 2015-09-15 LAB — CEA: CEA: 168.7 ng/mL — ABNORMAL HIGH (ref 0.0–4.7)

## 2015-09-17 ENCOUNTER — Encounter (HOSPITAL_COMMUNITY): Payer: Self-pay

## 2015-09-18 ENCOUNTER — Ambulatory Visit (INDEPENDENT_AMBULATORY_CARE_PROVIDER_SITE_OTHER): Payer: Medicare Other | Admitting: Internal Medicine

## 2015-09-18 DIAGNOSIS — C787 Secondary malignant neoplasm of liver and intrahepatic bile duct: Secondary | ICD-10-CM

## 2015-09-18 DIAGNOSIS — R110 Nausea: Secondary | ICD-10-CM

## 2015-09-18 DIAGNOSIS — K220 Achalasia of cardia: Secondary | ICD-10-CM

## 2015-09-18 DIAGNOSIS — C189 Malignant neoplasm of colon, unspecified: Secondary | ICD-10-CM

## 2015-09-18 DIAGNOSIS — R1030 Lower abdominal pain, unspecified: Secondary | ICD-10-CM | POA: Diagnosis not present

## 2015-09-18 MED ORDER — HYOSCYAMINE SULFATE 0.125 MG SL SUBL: 0.1250 mg | SUBLINGUAL_TABLET | Freq: Three times a day (TID) | SUBLINGUAL | 2 refills | Status: DC | PRN

## 2015-09-18 NOTE — Progress Notes (Signed)
Presenting complaint;  Follow-up for nausea and lower abdominal pain. Patient with achalasia who is undergoing chemotherapy for metastatic colon carcinoma.  Database and Subjective:  Patient is 82 year old Caucasian female who is here for scheduled visit accompanied by her daughter. She was last seen on 03/20/2015. Patient was initially diagnosed with colon carcinoma in 1989 and underwent surgery. She had second primary in 2010 for which she had bowel resection complicated by anastomotic leak and sepsis resulting in prolonged hospitalization and she ended up with ileostomy. She has distal half of colon in situ. This segment has narrowed and fixed and difficult to examine endoscopically. Last year she was diagnosed with metastatic colon carcinoma. Underwent thermal ablation to liver lesion in July last year. He was also found to have intracolonic lesion in excluded segment of for colon. He has been on Panitumumab since March 2017. She states her CEA is elevated but trending downwards. She has been experiencing dry skin and cracks in fingers of both hands. She developed hypomagnesemia with tachyarrhythmia. This has resolved with higher dose of carvedilol and by mouth magnesium. She continues to complain of nausea virtually every day. She does not want it however she may have intermittent regurgitation. Sometimes she has difficulty swallowing pills. Her appetite is poor. She has lost 8 pounds in the last 6 months. She remains with altered and liquid food dysphagia but it has not gotten any worse. She did experience lower abdominal pain last week and 2 days ago. She did not have fever chills rectal discharge or bleeding. She is still passing bits and pieces of stool per rectum. She tells me her niece(sister's daughter) was recently diagnosed with metastatic colon carcinoma. She also complains of right hip pain. She is considering getting steroid shots.   Current Medications: Outpatient Encounter  Prescriptions as of 09/18/2015  Medication Sig  . ALPRAZolam (XANAX) 0.5 MG tablet Take 0.5 mg by mouth at bedtime.   Marland Kitchen aspirin EC 325 MG tablet Take 325 mg by mouth daily.  . Calcium Carbonate (CALTRATE 600 PO) Take 1 tablet by mouth daily.   . carvedilol (COREG) 12.5 MG tablet Take 1 tablet (12.5 mg total) by mouth 2 (two) times daily with a meal. (Patient taking differently: Take 18.75 mg by mouth 2 (two) times daily with a meal. )  . diphenoxylate-atropine (LOMOTIL) 2.5-0.025 MG tablet Take 1-2 tablets four times a day as needed for loose stools.  Marland Kitchen doxycycline (VIBRA-TABS) 100 MG tablet Take 1 tablet (100 mg total) by mouth 2 (two) times daily.  . fentaNYL (DURAGESIC - DOSED MCG/HR) 25 MCG/HR patch Place 1 patch (25 mcg total) onto the skin every 3 (three) days.  Marland Kitchen HYDROcodone-acetaminophen (NORCO) 7.5-325 MG tablet Take 1 tablet by mouth every 4 (four) hours as needed.  . hyoscyamine (LEVSIN SL) 0.125 MG SL tablet Place 1 tablet (0.125 mg total) under the tongue every 6 (six) hours as needed (lower abdominal pain).  Marland Kitchen levothyroxine (SYNTHROID, LEVOTHROID) 75 MCG tablet Take 75 mcg by mouth daily.   . magnesium oxide (MAG-OX) 400 (241.3 Mg) MG tablet Take 1 tablet (400 mg total) by mouth 4 (four) times daily.  . metoprolol tartrate (LOPRESSOR) 25 MG tablet Take 1 tablet (25 mg total) by mouth as needed. For palpitations  . Multiple Vitamins-Minerals (CENTRUM WOMEN PO) Take by mouth daily.  Marland Kitchen NITROSTAT 0.4 MG SL tablet DISSOLVE ONE TABLET UNDER THE TONGUE EVERY 5 MINUTES AS NEEDED FOR CHEST PAIN.  DO NOT EXCEED A TOTAL OF 3 DOSES IN 15  MINUTES  . nystatin (MYCOSTATIN/NYSTOP) 100000 UNIT/GM POWD Apply 1 Bottle topically 2 (two) times daily.  Marland Kitchen nystatin-triamcinolone (MYCOLOG II) cream Apply 1 application topically 2 (two) times daily.  . Omega-3 Fatty Acids (FISH OIL) 1000 MG CAPS Take 1 capsule by mouth daily. Reported on 02/19/2015  . ondansetron (ZOFRAN ODT) 8 MG disintegrating tablet Take 1  tablet (8 mg total) by mouth every 8 (eight) hours as needed for nausea or vomiting.  . Panitumumab (VECTIBIX IV) Inject into the vein. To start 04/20/15. To be given every 2 weeks.  . pantoprazole (PROTONIX) 40 MG tablet Take 40 mg by mouth daily.   . Probiotic Product (PROBIOTIC & ACIDOPHILUS EX ST PO) Take 1 tablet by mouth daily.  . prochlorperazine (COMPAZINE) 10 MG tablet Take 1 tablet (10 mg total) by mouth every 6 (six) hours as needed for nausea or vomiting.  . [DISCONTINUED] Multiple Vitamins-Minerals (EQL GUMMY ADULT) CHEW Chew 2 tablets by mouth daily. Reported on 02/06/2015   No facility-administered encounter medications on file as of 09/18/2015.      Objective: There were no vitals taken for this visit. Patient is alert and in no acute distress. He is able to move from chair to examination table with minimal assistance. Conjunctiva is pink. Sclera is nonicteric Oropharyngeal mucosa is normal. No neck masses or thyromegaly noted. Cardiac exam with regular rhythm normal S1 and S2. No murmur or gallop noted. Lungs are clear to auscultation. AbdomenIs asymmetric. She has ileostomy in right side of her abdomen with parastomal hernia. Abdomen is soft with mild tenderness below the right costal margin. No hepatomegaly or masses. Trace edema around ankles.  Labs/studies Results: Lab data from 09/14/2015 CEA  168.7   H&H 11.4 and 35.2 on 09/14/2015 Bilirubin 0.3, AP 72, AST 27, ALT 16 and albumin 3.7.  Assessment:  #1. Nausea most likely secondary to chemotherapy. Will treat symptomatically. If nausea gets worse or she develops vomiting with concern diagnostic EGD. #2. Lower abdominal pain. She has history of diversion colitis but she does not have other symptoms to call along with pain. Abdominal pain may be related to magnesium. Will monitor. #3. Achalasia. She has been treated with Botox therapy in the past. Will continue to monitor this symptom. #4. Metastatic colon  carcinoma. CEA is coming down with therapy. She is due for CT scan later this month.   Plan:  Prescription for Levsin sublingual one 3 times a day when necessary given. Patient advised to take ondansetron sublingual every morning and use subsequent doses on when necessary basis. If she develops vomiting will consider diagnostic EGD. Office visit in 3 months.

## 2015-09-18 NOTE — Patient Instructions (Signed)
Take ondansetron every morning and subsequent doses on as-needed basis. If nausea does not improve will consider esophagogastroduodenoscopy.

## 2015-09-19 ENCOUNTER — Other Ambulatory Visit (HOSPITAL_COMMUNITY): Payer: Self-pay | Admitting: Family Medicine

## 2015-09-19 DIAGNOSIS — Z1231 Encounter for screening mammogram for malignant neoplasm of breast: Secondary | ICD-10-CM

## 2015-09-20 ENCOUNTER — Telehealth (INDEPENDENT_AMBULATORY_CARE_PROVIDER_SITE_OTHER): Payer: Self-pay | Admitting: *Deleted

## 2015-09-20 NOTE — Telephone Encounter (Signed)
Patient states that she ate Oyster Stew last night without the oysters.As she has no appetite or taste for foods. She began to experience bloating. She took hyoscyamine and slept. Got up took her night time meds and went back to bed. Awoke at 5 :22 am her bag had bursted and she states that there was stool everywhere. It was watery,clear and has turn to a dark green almost black. She is afebrile. States that her abdomen from the lower part up to underneath her breast bone is tender and feels swollen. She had just taken 2 Lomotil. Says that it has been Rx to her to take 2 in the morning , 2 at night. In between she is to take 2 imodium. She hasn't taken any Imodium.  Per Dr.Rehman - she may take Hyoscyamine up to three times a day. It sounds like the blockage has passed. If she get a fever or if bowels begin to have a foul odor, we will do stool cultures at that time. For now , we are going to monitor her.  Patient was called and made aware.

## 2015-09-23 ENCOUNTER — Encounter (INDEPENDENT_AMBULATORY_CARE_PROVIDER_SITE_OTHER): Payer: Self-pay | Admitting: Internal Medicine

## 2015-09-24 ENCOUNTER — Ambulatory Visit (HOSPITAL_COMMUNITY)
Admission: RE | Admit: 2015-09-24 | Discharge: 2015-09-24 | Disposition: A | Discharge status: Home / Self Care | Payer: Medicare Other | Source: Ambulatory Visit | Attending: Oncology | Admitting: Oncology

## 2015-09-24 DIAGNOSIS — I700 Atherosclerosis of aorta: Secondary | ICD-10-CM | POA: Insufficient documentation

## 2015-09-24 DIAGNOSIS — K5730 Diverticulosis of large intestine without perforation or abscess without bleeding: Secondary | ICD-10-CM | POA: Diagnosis not present

## 2015-09-24 DIAGNOSIS — I2510 Atherosclerotic heart disease of native coronary artery without angina pectoris: Secondary | ICD-10-CM | POA: Insufficient documentation

## 2015-09-24 DIAGNOSIS — R911 Solitary pulmonary nodule: Secondary | ICD-10-CM | POA: Diagnosis not present

## 2015-09-24 DIAGNOSIS — C189 Malignant neoplasm of colon, unspecified: Secondary | ICD-10-CM | POA: Diagnosis not present

## 2015-09-24 DIAGNOSIS — C787 Secondary malignant neoplasm of liver and intrahepatic bile duct: Secondary | ICD-10-CM | POA: Insufficient documentation

## 2015-09-24 DIAGNOSIS — C19 Malignant neoplasm of rectosigmoid junction: Secondary | ICD-10-CM | POA: Diagnosis not present

## 2015-09-24 MED ORDER — IOPAMIDOL (ISOVUE-300) INJECTION 61%
100.0000 mL | Freq: Once | INTRAVENOUS | Status: AC | PRN
  Administered 2015-09-24: 100 mL via INTRAVENOUS

## 2015-09-25 DIAGNOSIS — M1611 Unilateral primary osteoarthritis, right hip: Secondary | ICD-10-CM | POA: Diagnosis not present

## 2015-09-28 ENCOUNTER — Encounter (HOSPITAL_COMMUNITY): Payer: Medicare Other

## 2015-09-28 ENCOUNTER — Encounter (HOSPITAL_COMMUNITY): Payer: Self-pay

## 2015-09-28 ENCOUNTER — Encounter (HOSPITAL_BASED_OUTPATIENT_CLINIC_OR_DEPARTMENT_OTHER): Payer: Medicare Other | Admitting: Oncology

## 2015-09-28 ENCOUNTER — Ambulatory Visit (HOSPITAL_COMMUNITY): Payer: Medicare Other

## 2015-09-28 ENCOUNTER — Encounter (HOSPITAL_BASED_OUTPATIENT_CLINIC_OR_DEPARTMENT_OTHER): Payer: Medicare Other

## 2015-09-28 VITALS — BP 125/51 | HR 59 | Temp 97.3°F | Resp 18 | Wt 137.2 lb

## 2015-09-28 DIAGNOSIS — Z5112 Encounter for antineoplastic immunotherapy: Secondary | ICD-10-CM

## 2015-09-28 DIAGNOSIS — C189 Malignant neoplasm of colon, unspecified: Secondary | ICD-10-CM

## 2015-09-28 DIAGNOSIS — R112 Nausea with vomiting, unspecified: Secondary | ICD-10-CM

## 2015-09-28 DIAGNOSIS — C787 Secondary malignant neoplasm of liver and intrahepatic bile duct: Secondary | ICD-10-CM | POA: Diagnosis not present

## 2015-09-28 DIAGNOSIS — R110 Nausea: Secondary | ICD-10-CM

## 2015-09-28 DIAGNOSIS — D509 Iron deficiency anemia, unspecified: Secondary | ICD-10-CM | POA: Diagnosis not present

## 2015-09-28 LAB — COMPREHENSIVE METABOLIC PANEL
ALBUMIN: 3.6 g/dL (ref 3.5–5.0)
ALK PHOS: 73 U/L (ref 38–126)
ALT: 15 U/L (ref 14–54)
AST: 26 U/L (ref 15–41)
Anion gap: 7 (ref 5–15)
BILIRUBIN TOTAL: 0.4 mg/dL (ref 0.3–1.2)
BUN: 28 mg/dL — AB (ref 6–20)
CO2: 23 mmol/L (ref 22–32)
Calcium: 9 mg/dL (ref 8.9–10.3)
Chloride: 108 mmol/L (ref 101–111)
Creatinine, Ser: 1.21 mg/dL — ABNORMAL HIGH (ref 0.44–1.00)
GFR calc Af Amer: 47 mL/min — ABNORMAL LOW (ref >60)
GFR calc non Af Amer: 40 mL/min — ABNORMAL LOW (ref >60)
GLUCOSE: 101 mg/dL — AB (ref 65–99)
Potassium: 4.2 mmol/L (ref 3.5–5.1)
SODIUM: 138 mmol/L (ref 135–145)
Total Protein: 6.8 g/dL (ref 6.5–8.1)

## 2015-09-28 LAB — MAGNESIUM: MAGNESIUM: 1.1 mg/dL — AB (ref 1.7–2.4)

## 2015-09-28 LAB — CBC WITH DIFFERENTIAL/PLATELET
BASOS ABS: 0 10*3/uL (ref 0.0–0.1)
BASOS PCT: 0 %
Eosinophils Absolute: 0.1 10*3/uL (ref 0.0–0.7)
Eosinophils Relative: 3 %
HEMATOCRIT: 35.6 % — AB (ref 36.0–46.0)
HEMOGLOBIN: 11.8 g/dL — AB (ref 12.0–15.0)
Lymphocytes Relative: 32 %
Lymphs Abs: 1.4 10*3/uL (ref 0.7–4.0)
MCH: 29.8 pg (ref 26.0–34.0)
MCHC: 33.1 g/dL (ref 30.0–36.0)
MCV: 89.9 fL (ref 78.0–100.0)
MONOS PCT: 11 %
Monocytes Absolute: 0.5 10*3/uL (ref 0.1–1.0)
NEUTROS ABS: 2.3 10*3/uL (ref 1.7–7.7)
NEUTROS PCT: 54 %
Platelets: 198 10*3/uL (ref 150–400)
RBC: 3.96 MIL/uL (ref 3.87–5.11)
RDW: 13 % (ref 11.5–15.5)
WBC: 4.2 10*3/uL (ref 4.0–10.5)

## 2015-09-28 MED ORDER — SODIUM CHLORIDE 0.9 % IV SOLN
Freq: Once | INTRAVENOUS | Status: AC
  Administered 2015-09-28: 11:00:00 via INTRAVENOUS
  Filled 2015-09-28: qty 4

## 2015-09-28 MED ORDER — SODIUM CHLORIDE 0.9 % IV SOLN
4.9980 mg/kg | Freq: Once | INTRAVENOUS | Status: AC
  Administered 2015-09-28: 340 mg via INTRAVENOUS
  Filled 2015-09-28: qty 17

## 2015-09-28 MED ORDER — MAGNESIUM SULFATE 4 GM/100ML IV SOLN
4.0000 g | Freq: Once | INTRAVENOUS | Status: AC
Start: 2015-09-28 — End: 2015-09-28
  Administered 2015-09-28: 4 g via INTRAVENOUS
  Filled 2015-09-28: qty 100

## 2015-09-28 MED ORDER — SODIUM CHLORIDE 0.9 % IV SOLN
Freq: Once | INTRAVENOUS | Status: AC
  Administered 2015-09-28: 09:00:00 via INTRAVENOUS

## 2015-09-28 NOTE — Patient Instructions (Addendum)
[place] at [facility] Discharge Instructions  RECOMMENDATIONS MADE BY THE CONSULTANT AND ANY TEST RESULTS WILL BE SENT TO YOUR REFERRING PHYSICIAN.  You saw Kirby Crigler, PA-C, today. You will have labs and treatment in 2 weeks. You will have labs, treatment and follow up in 4 weeks. CT scans will be scheduled in 8 weeks.  Thank you for choosing [place] at [facility] to provide your oncology and hematology care.  To afford each patient quality time with our provider, please arrive at least 15 minutes before your scheduled appointment time.   Beginning January 23rd 2017 lab work for the [name] will be done in the  Main lab at [facility] on 1st floor. If you have a lab appointment with the [facility] please come in thru the  Main Entrance and check in at the main information desk  You need to re-schedule your appointment should you arrive 10 or more minutes late.  We strive to give you quality time with our providers, and arriving late affects you and other patients whose appointments are after yours.  Also, if you no show three or more times for appointments you may be dismissed from the clinic at the providers discretion.     Again, thank you for choosing [facility].  Our hope is that these requests will decrease the amount of time that you wait before being seen by our physicians.       _____________________________________________________________  Should you have questions after your visit to [facility], please contact our office at [phone] between the hours of 8:30 a.m. and 4:30 p.m.  Voicemails left after 4:30 p.m. will not be returned until the following business day.  For prescription refill requests, have your pharmacy contact our office.         Resources For Cancer Patients and their Caregivers ? American Cancer Society: Can assist with transportation, wigs, general needs, runs  Look Good Feel Better.        (818)202-5871 ? Cancer Care: Provides financial assistance, online support groups, medication/co-pay assistance.  1-800-813-HOPE 8254798264) ? [facility] Assists [place] cancer patients and their families through emotional , educational and financial support.  801-250-6612 ? [place] DSS Where to apply for food stamps, Medicaid and utility assistance. 972 122 5516 ? [employer]ATS: Transportation to medical appointments. (337)177-7322 ? Social Security Administration: May apply for disability if have a Stage IV cancer. 931-644-6478 574-174-0035 ? [employer], Disability and Transit Services: Assists with nutrition, care and transit needs. [place] Support Programs: @10RELATIVEDAYS @ > Cancer Support Group  2nd Tuesday of the month 1pm-2pm, [name]  > Creative Journey  3rd Tuesday of the month 1130am-1pm, [name]  > Look Good Feel Better  1st Wednesday of the month 10am-12 noon, [name] (Call [place] to register [phone])

## 2015-09-28 NOTE — Patient Instructions (Signed)
[facility] Discharge Instructions for Patients Receiving Chemotherapy   Beginning January 23rd 2017 lab work for the [facility] will be done in the  Main lab at [facility] on 1st floor. If you have a lab appointment with the [facility] please come in thru the  Main Entrance and check in at the main information desk   Today you received the following chemotherapy agents:  Vectibix; you also received magnesium 4 grams IV and Zofran 8 mg IV (for nausea).  If you develop nausea and vomiting, or diarrhea that is not controlled by your medication, call the clinic.  The clinic phone number is [phone]. Office hours are Monday-Friday 8:30am-5:00pm.  BELOW ARE SYMPTOMS THAT SHOULD BE REPORTED IMMEDIATELY:  *FEVER GREATER THAN 101.0 F  *CHILLS WITH OR WITHOUT FEVER  NAUSEA AND VOMITING THAT IS NOT CONTROLLED WITH YOUR NAUSEA MEDICATION  *UNUSUAL SHORTNESS OF BREATH  *UNUSUAL BRUISING OR BLEEDING  TENDERNESS IN MOUTH AND THROAT WITH OR WITHOUT PRESENCE OF ULCERS  *URINARY PROBLEMS  *BOWEL PROBLEMS  UNUSUAL RASH Items with * indicate a potential emergency and should be followed up as soon as possible. If you have an emergency after office hours please contact your primary care physician or go to the nearest emergency department.  Please call the clinic during office hours if you have any questions or concerns.   You may also contact the Patient Navigator at [phone] should you have any questions or need assistance in obtaining follow up care.      Resources For Cancer Patients and their Caregivers ? American Cancer Society: Can assist with transportation, wigs, general needs, runs Look Good Feel Better.        (867)371-3232 ? Cancer Care: Provides financial assistance, online support groups, medication/co-pay assistance.  1-800-813-HOPE 402-207-1113) ? [facility] Assists [place] cancer patients and their families  through emotional , educational and financial support.  (304)603-9406 ? [place] DSS Where to apply for food stamps, Medicaid and utility assistance. 2482118486 ? [employer]: Transportation to medical appointments. 519-199-9148 ? Social Security Administration: May apply for disability if have a Stage IV cancer. 2720143061 9296102528 ? [employer], Disability and Transit Services: Assists with nutrition, care and transit needs. [phone]

## 2015-09-28 NOTE — Assessment & Plan Note (Addendum)
Stage IV CRC, KRAS wild-type, S/P microwave ablation of a solitary liver lesion on 09/01/2014.  S/P palliative Xeloda 1500 mg BID 7 days on and 7 days off beginning 01/2015 with progression of disease identified in March 2017 resulting in a discontinuation in [place] therapy.  Now on Vectibix every 2 weeks beginning on 04/20/2015 with a dose reduction on 06/15/2015.  Oncology history updated.  Labs will be updated today: CBC diff, CMET, Mg, CEA.  I personally reviewed and went over laboratory results with the patient.  The results are noted within this dictation.  Labs satisfy treatment parameters.  Hypomagnesemia is noted and she is administered 4 g of IV magnesium sulfate.  She will return next week for another dose.  I personally reviewed and went over radiographic studies with the patient.  The results are noted within this dictation.  CT scan is stable except for a mild increase in one hepatic lesion.  There are two new hepatic areas that are concerning for new metastatic disease.  Scans are reviewed in detail with Dr. Thornton Papas.  He notes that these area are new, but he cannot definitively say they are sites of metastatic disease based upon appearance.  She notes continued nausea without vomiting.  Order is placed for 8 mg of IV Zofran.  She saw Dr. Laural Golden on 09/18/15 and his note is reviewed.  Code Status has been discussed on multiple occassions and she remains very unrealistic regarding goals of care.  "I want to live as long as I can."  She wants to remain a FULL CODE.  "The good Lord will take me when he is ready, even if I'm on a respiratory."   CT scans are ordered for 2 months for restaging.  Return in 2 weeks for treatment and 4 weeks for treatment and follow-up.  After treatment today, she is attending the funeral of Juanita Craver.

## 2015-09-28 NOTE — Progress Notes (Signed)
1140:  Tolerated infusions w/o adverse reaction.  Alert, in no distress.  VSS.  Discharged via wheelchair in c/o family.

## 2015-09-28 NOTE — Progress Notes (Signed)
Karen Hilding, MD [address] [place] [address]  Colon cancer metastasized to liver [facility]) - Plan: CT Abdomen Pelvis W Contrast  Nausea without vomiting - Plan: ondansetron (ZOFRAN) 8 mg in sodium chloride 0.9 % 50 mL IVPB  CURRENT THERAPY: Vectubix every 2 weeks.  INTERVAL HISTORY: Karen [name] 82 y.o. [name] returns for followup of Stage IV CRC, KRAS wild-type, S/P microwave ablation of a solitary liver lesion on 09/01/2014.  S/P palliative Xeloda 1500 mg BID 7 days on and 7 days off beginning 01/2015 with progression of disease identified in March 2017 resulting in a discontinuation in [name] therapy.  Now on Vectibix every 2 weeks beginning on 04/20/2015 with a dose reduction on 06/15/2015.    Colon cancer metastasized to liver [facility])   04/12/1987 Definitive Surgery    Dr. Lindalou [name] at [facility]      09/01/2014 Pathology Results    Liver, needle/core biopsy - METASTATIC ADENOCARCINOMA.      09/01/2014 Miscellaneous    MWA left liver lesion, Karen [name] (IR)      01/18/2015 Progression    PET scan      01/18/2015 PET scan    Recurrence of  tumor within segment 2 of the liver. There are 2 new lesions identified within segment 5 and segment 6 of the liver worrisome for additional sites of metastasis. Evidence of tumor recurrence at the intra colonic anastomosis is noted...      01/28/2015 - 04/16/2015 Chemotherapy    Xeloda 1500 mg BID 7 days on and 7 days off beginning in December 2016.      04/12/2015 Imaging    CT C/A/P progression of disease, enlargement of metastatic lesion in segments 2/3 of liver, development of mass with colon at junction of descending colon and sigmoid, indeterminate lesion in lower pole of the left kidney      04/20/2015 -  Antibody Plan    Vectibix every 2 weeks, single-agent.      06/12/2015 - 06/13/2015 Hospital Admission    Hypotension, diarrhea, ARF, UTI      06/15/2015 Adverse Reaction    Vectibix rash      06/15/2015  Treatment Plan Change    Vectibix reduced to 5 mg/m2      07/13/2015 Treatment Plan Change    Treatment deferred due to hypomagnesemia and palpitations.  Escorted to ED.      09/24/2015 Imaging    CT abd/pelvis-  Liver lesions as follows:  Segment 2 left liver lobe hypodense 3.5 x 2.2 cm liver mass, previously 3.1 x 2.0 cm, mildly increased. Medial segment 6 right liver lobe 1.7 x 0.9 cm hypodense liver mass, new. Peripheral segment 6 right liver lobe 1.0 x 0.7 cm hypodense liver mass, new. Serosal 0.7 cm segment 5 right liver lobe mass, previously 0.6 cm, not appreciably changed. Hypodense 0.6 cm liver lesion adjacent to the IVC, previously 0.6 cm, unchanged.      09/24/2015 Imaging    CT chest- New 3 mm right upper lobe pulmonary nodule, indeterminate, pulmonary metastasis not excluded.      She is interested in learning the results of her CT scan.  I have reviewed these with the patient and discussed with Dr. Thornton [name] previously.  Following treatment today, she is going to attend Karen [name] funeral/viewing.  She continues with Nausea without vomiting.  Her weight has declined slightly.  We will need to monitor moving forward.  She notes "I get hungry but  I don't know what I want."  She denies any vomiting.  She saw Dr. Laural [name] on 8/8.  Review of Systems  Constitutional: Negative for chills and fever.  HENT: Negative.   Eyes: Negative.   Respiratory: Negative.  Negative for cough and shortness of breath.   Cardiovascular: Negative for chest pain.  Gastrointestinal: Positive for nausea. Negative for heartburn and vomiting.  Genitourinary: Negative.   Musculoskeletal: Positive for back pain and joint pain (right hip). Negative for falls.  Neurological: Negative.  Negative for dizziness.  Endo/Heme/Allergies: Negative.   Psychiatric/Behavioral: Negative.     Past Medical History:  Diagnosis Date  . Abdominal adhesions   . Achalasia    a. s/p botox injection for achalasia.  .  Anemia   . Arthritis   . Asthma    hx of  . Blood transfusion   . Breast lump   . Cancer ([place])    bladder  . Chest pain    a. 2002 Cath: nl cors;  b. 2009 aden mv: nl;  c. 05/2009 Echo: nl. d. 2014: normal nuclear stress test, EF 69%.  . Chronic diastolic CHF (congestive heart failure) (HCC)    a. nl EF by echo 05/2009.  . CKD (chronic kidney disease), stage III    pt. states decreased kidney function  . Colon cancer ([place])    a. recurrence 2016 with liver mets -  percutaneous liver biopsy and thermal ablation of a liver metastasis by interventional radiology..  . Colon polyp   . Depression   . Diabetes mellitus without complication ([place]) 0/35/00   borderline type II; takes no medication for it  . DVT of deep femoral vein ([place])   . Family history of bladder cancer   . Family history of breast cancer   . Family history of colon cancer   . Fibromyalgia   . GERD (gastroesophageal reflux disease)   . Headache   . Hearing loss   . Hernia   . History of blood clots   . History of PSVT (paroxysmal supraventricular tachycardia)   . Hyperlipidemia   . Hypertension   . Ileostomy present ([place])   . Iron deficiency anemia due to chronic blood loss 07/06/2013  . LBBB (left bundle branch block)   . Obstruction of intestine or colon ([place]) 06/2014  . Sinus bradycardia    a. HR 30s in 08/2014 requiring holding of digoxin.  Marland Kitchen Thyroid disease   . UTI (urinary tract infection)   . Vision abnormalities 2016   not going blind but having retina problems; might lose ability to see faces  . Wears dentures     Past Surgical History:  Procedure Laterality Date  . ABDOMINAL HYSTERECTOMY    . APPENDECTOMY    . BOTOX INJECTION N/A 09/01/2013   Procedure: BOTOX INJECTION;  Surgeon: Rogene Houston, MD;  Location: [facility] END[facility] SU[facility];  Service: Endoscopy;  Laterality: N/A;  . BREAST SURGERY     right breast biopsy  . CARDIAC CATHETERIZATION  12/10/2000   THE LEFT VENTRICLE IS MILDY DILATED. THERE IS MILD TO  MODERATE DIFFUSE HYPOKINESIS WITH EF 35%  . CATARACT EXTRACTION W/PHACO  11/13/2011   Procedure: CATARACT EXTRACTION PHACO AND INTRAOCULAR LENS PLACEMENT (IOC);  Surgeon: Tonny Branch, MD;  Location: [facility] ORS;  Service: Ophthalmology;  Laterality: Right;  CDE 12.26  . CATARACT EXTRACTION W/PHACO  12/08/2011   Procedure: CATARACT EXTRACTION PHACO AND INTRAOCULAR LENS PLACEMENT (IOC);  Surgeon: Tonny Branch, MD;  Location: [facility] ORS;  Service: Ophthalmology;  Laterality: Left;  CDE 15.11  . CHOLECYSTECTOMY    . COLON CANCER SURGERY    . COLON SURGERY    . COLONOSCOPY  09/26/2011   Procedure: COLONOSCOPY;  Surgeon: Rogene Houston, MD;  Location: [facility] END[facility] SUITE;  Service: Endoscopy;  Laterality: N/A;  215  . CORONARY ANGIOPLASTY    . ESOPHAGOGASTRODUODENOSCOPY  02/13/2011   Procedure: ESOPHAGOGASTRODUODENOSCOPY (EGD);  Surgeon: Rogene Houston, MD;  Location: [facility];  Service: Endoscopy;  Laterality: N/A;  1030  . ESOPHAGOGASTRODUODENOSCOPY N/A 09/01/2013   Procedure: ESOPHAGOGASTRODUODENOSCOPY (EGD);  Surgeon: Rogene Houston, MD;  Location: [facility] END[facility];  Service: Endoscopy;  Laterality: N/A;  830  . EYE SURGERY  2014   catarac surgery  . ILEOSTOMY    . ILEOSTOMY    . JOINT REPLACEMENT Left 2008  . liver biopsy and ablation  09/01/14  . ovarian cystectomy 1955    . ROTATOR CUFF REPAIR    . TONSILLECTOMY      Family History  Problem Relation Age of Onset  . Heart disease Mother   . Stroke Mother     deceased  . Arthritis Mother   . Bladder Cancer Mother     dx in her 25s  . Colon cancer Mother   . Arthritis Father   . Heart disease Father     decesaed  . Leukemia Father   . Stroke Sister     alive/debilitated  . Hypertension Sister   . Other Sister     paralysis  . Heart disease Brother     bypass surgery  . Arthritis Brother   . Colon cancer Brother   . Bladder Cancer Brother   . Stroke Sister     alive/debilitated  . Diabetes Sister   . Other Brother     stomach problems    . Other Brother     bladder  . Colon cancer Paternal Aunt   . Bladder Cancer Other     dx twice in her 30s-40s  . Breast cancer Other     great niece dx in her early 68s  . Prostate cancer Other     newphew  . Colon polyps Daughter   . Stroke    . Hypertension      Social History   Social History  . Marital status: Married    Spouse name: N/A  . Number of children: 4  . Years of education: N/A   Social History Main Topics  . Smoking status: Former Smoker    Packs/day: 0.25    Years: 13.00    Types: Cigarettes    Quit date: 02/11/1963  . Smokeless tobacco: Never Used  . Alcohol use No  . Drug use: No  . Sexual activity: Not on file   Other Topics Concern  . Not on file   Social History Narrative  . No narrative on file     PHYSICAL EXAMINATION  ECOG PERFORMANCE STATUS: 1 - Symptomatic but completely ambulatory  There were no vitals filed for this visit.  BP 125/51 P 59 T 97.3 R 18  GENERAL:alert, no distress, well nourished, well developed, comfortable, cooperative and blunted affect, accompanied by her daughter, in chemo-recliner. SKIN: skin color, texture, turgor are normal.   HEAD: Normocephalic, No masses, lesions, tenderness or abnormalities EYES: normal, EOMI, Conjunctiva are pink and non-injected EARS: External ears normal OROPHARYNX:lips, buccal mucosa, and tongue normal and mucous membranes are moist  NECK: supple, trachea midline LYMPH:  no palpable lymphadenopathy BREAST:not  examined LUNGS: clear to auscultation  HEART: regular rate & rhythm, S1 normal and S2 normal.   ABDOMEN:abdomen soft and normal bowel sounds BACK: Back symmetric, no curvature. EXTREMITIES:less then 2 second capillary refill, no joint deformities, effusion, or inflammation, no skin discoloration, no cyanosis  NEURO: alert & oriented x 3 with fluent speech, no focal motor/sensory deficits, gait normal   LABORATORY DATA: CBC    Component Value Date/Time   WBC 4.2  09/28/2015 0813   RBC 3.96 09/28/2015 0813   HGB 11.8 (L) 09/28/2015 0813   HCT 35.6 (L) 09/28/2015 0813   PLT 198 09/28/2015 0813   MCV 89.9 09/28/2015 0813   MCH 29.8 09/28/2015 0813   MCHC 33.1 09/28/2015 0813   RDW 13.0 09/28/2015 0813   LYMPHSABS 1.4 09/28/2015 0813   MONOABS 0.5 09/28/2015 0813   EOSABS 0.1 09/28/2015 0813   BASOSABS 0.0 09/28/2015 0813      Chemistry      Component Value Date/Time   NA 138 09/28/2015 0813   K 4.2 09/28/2015 0813   CL 108 09/28/2015 0813   CO2 23 09/28/2015 0813   BUN 28 (H) 09/28/2015 0813   CREATININE 1.21 (H) 09/28/2015 0813   CREATININE 0.95 (H) 12/07/2014 1512      Component Value Date/Time   CALCIUM 9.0 09/28/2015 0813   ALKPHOS 73 09/28/2015 0813   AST 26 09/28/2015 0813   ALT 15 09/28/2015 0813   BILITOT 0.4 09/28/2015 0813     Lab Results  Component Value Date   CEA 168.7 (H) 09/14/2015    PENDING LABS:   RADIOGRAPHIC STUDIES:  Ct Chest W Contrast  Addendum Date: 09/28/2015   ADDENDUM REPORT: 09/28/2015 08:09 ADDENDUM: Liver lesions as follows: Segment 2 left liver lobe hypodense 3.5 x 2.2 cm liver mass (series 2/image 50), previously 3.1 x 2.0 cm, mildly increased. Medial segment 6 right liver lobe 1.7 x 0.9 cm hypodense liver mass (series 2/image 56), new. Peripheral segment 6 right liver lobe 1.0 x 0.7 cm hypodense liver mass (series 2/image 50), new. Serosal 0.7 cm segment 5 right liver lobe mass (series 2/image 59), previously 0.6 cm, not appreciably changed. Hypodense 0.6 cm liver lesion adjacent to the IVC, previously 0.6 cm, unchanged. The original report is otherwise unchanged. Electronically Signed   By: Ilona Sorrel M.D.   On: 09/28/2015 08:09   Result Date: 09/28/2015 CLINICAL DATA:  Re- stage stage IV colorectal cancer. EXAM: CT CHEST, ABDOMEN, AND PELVIS WITH CONTRAST TECHNIQUE: Multidetector CT imaging of the chest, abdomen and pelvis was performed following the standard protocol during bolus  administration of intravenous contrast. CONTRAST:  115m ISOVUE-300 IOPAMIDOL (ISOVUE-300) INJECTION 61% COMPARISON:  04/12/2015 CT chest, abdomen and pelvis. FINDINGS: CT CHEST FINDINGS Mediastinum/Nodes: Stable mild cardiomegaly. No significant pericardial fluid/thickening. Coronary atherosclerosis . Atherosclerotic nonaneurysmal thoracic aorta. Normal caliber pulmonary arteries. No central pulmonary emboli. No discrete thyroid nodules. Stable mild circumferential wall thickening in the lower third of the thoracic esophagus. No pathologically enlarged axillary, mediastinal or hilar lymph nodes. Stable 0.6 cm right pericardiophrenic lymph node, top-normal. Lungs/Pleura: No pneumothorax. No pleural effusion. New subpleural right upper lobe 3 mm pulmonary nodule (series 6/ image 45). Stable parenchymal band in the basilar right lower lobe. Stable 3 mm subpleural posterior left lower lobe pulmonary nodule (series 6/ image 104). No acute consolidative airspace disease or additional significant pulmonary nodules. Musculoskeletal: No aggressive appearing focal osseous lesions. Moderate mid thoracic spondylosis. CT ABDOMEN PELVIS FINDINGS Hepatobiliary: Segment 2 left liver lobe heterogeneous hypodense  liver mass (series 2/ image 50), previously 3.1 x 2.0 cm, mildly increased. Hypodense 0.6 cm segment 6 liver lesion (series 2/ image 56), too small to characterize, unchanged. There are 2 new ill-defined hypodense lesions in the posterior right liver lobe, largest 1.7 x 0.9 cm (series 2/ image 56). There is a 0.7 cm nodule at the right liver surface (series 2/ image 59), previously 0.6 cm, not definitely changed. Cholecystectomy. No biliary ductal dilatation. Pancreas: Normal, with no mass or duct dilation. Spleen: Normal size. No mass. Adrenals/Urinary Tract: Normal adrenals. Simple 2.3 cm upper right renal cyst. Hypodense 1.1 cm anterior lower left renal lesion (series 2/ image 71), not appreciably changed in size back to  06/14/2012, suggesting a benign renal cyst. Subcentimeter hypodense renal cortical lesion in the interpolar left kidney is too small to characterize and is stable. No hydronephrosis. Bladder is poorly visualized due to streak artifact from the left hip hardware with no gross bladder abnormality. Stomach/Bowel: Grossly normal stomach. Status post right hemicolectomy with right ventral abdominal wall end ileostomy. Stable prominent diastasis of the right ventral abdominal wall at the ileostomy site. No small bowel dilatation or wall thickening. Oral contrast progresses to the end ileostomy. There is a 3.3 x 3.1 cm colonic mass at the junction of the descending and sigmoid colon (series 2/image 80), previously 3.3 x 2.9 cm using similar measurement technique, not appreciably changed. Mild sigmoid diverticulosis. Vascular/Lymphatic: Atherosclerotic nonaneurysmal abdominal aorta. Patent portal, splenic, hepatic and renal veins. No pathologically enlarged lymph nodes in the abdomen or pelvis. Reproductive: Status post hysterectomy, with no abnormal findings at the vaginal cuff. No adnexal mass. Other: No pneumoperitoneum, ascites or focal fluid collection. Musculoskeletal: No aggressive appearing focal osseous lesions. Small sclerotic L3 and L5 vertebral body lesions are unchanged since 2011, consistent with benign bone islands. Mild lumbar spondylosis. IMPRESSION: 1. Left liver lobe metastasis is mildly increased in size. Two new ill-defined low-attenuation posterior right liver lobe lesions, cannot exclude new liver metastases. 2. Stable subcentimeter nodule at the right liver capsule, which could represent a liver metastasis or peritoneal tumor implant. 3. Stable 3.3 cm colonic mass at the junction of the sigmoid and descending colon, which could represent a serosal peritoneal tumor implant versus metachronous primary colonic neoplasm. 4. New 3 mm right upper lobe pulmonary nodule, indeterminate, pulmonary metastasis  not excluded. Recommend attention on follow-up chest CT in 3-6 months. 5. Additional findings include aortic atherosclerosis, coronary atherosclerosis, stable nonspecific wall thickening in the lower third of the thoracic esophagus and mild sigmoid diverticulosis. Electronically Signed: By: Ilona Sorrel M.D. On: 09/24/2015 11:47   Ct Abdomen Pelvis W Contrast  Addendum Date: 09/28/2015   ADDENDUM REPORT: 09/28/2015 08:09 ADDENDUM: Liver lesions as follows: Segment 2 left liver lobe hypodense 3.5 x 2.2 cm liver mass (series 2/image 50), previously 3.1 x 2.0 cm, mildly increased. Medial segment 6 right liver lobe 1.7 x 0.9 cm hypodense liver mass (series 2/image 56), new. Peripheral segment 6 right liver lobe 1.0 x 0.7 cm hypodense liver mass (series 2/image 50), new. Serosal 0.7 cm segment 5 right liver lobe mass (series 2/image 59), previously 0.6 cm, not appreciably changed. Hypodense 0.6 cm liver lesion adjacent to the IVC, previously 0.6 cm, unchanged. The original report is otherwise unchanged. Electronically Signed   By: Ilona Sorrel M.D.   On: 09/28/2015 08:09   Result Date: 09/28/2015 CLINICAL DATA:  Re- stage stage IV colorectal cancer. EXAM: CT CHEST, ABDOMEN, AND PELVIS WITH CONTRAST TECHNIQUE: Multidetector CT imaging  of the chest, abdomen and pelvis was performed following the standard protocol during bolus administration of intravenous contrast. CONTRAST:  18m ISOVUE-300 IOPAMIDOL (ISOVUE-300) INJECTION 61% COMPARISON:  04/12/2015 CT chest, abdomen and pelvis. FINDINGS: CT CHEST FINDINGS Mediastinum/Nodes: Stable mild cardiomegaly. No significant pericardial fluid/thickening. Coronary atherosclerosis . Atherosclerotic nonaneurysmal thoracic aorta. Normal caliber pulmonary arteries. No central pulmonary emboli. No discrete thyroid nodules. Stable mild circumferential wall thickening in the lower third of the thoracic esophagus. No pathologically enlarged axillary, mediastinal or hilar lymph  nodes. Stable 0.6 cm right pericardiophrenic lymph node, top-normal. Lungs/Pleura: No pneumothorax. No pleural effusion. New subpleural right upper lobe 3 mm pulmonary nodule (series 6/ image 45). Stable parenchymal band in the basilar right lower lobe. Stable 3 mm subpleural posterior left lower lobe pulmonary nodule (series 6/ image 104). No acute consolidative airspace disease or additional significant pulmonary nodules. Musculoskeletal: No aggressive appearing focal osseous lesions. Moderate mid thoracic spondylosis. CT ABDOMEN PELVIS FINDINGS Hepatobiliary: Segment 2 left liver lobe heterogeneous hypodense liver mass (series 2/ image 50), previously 3.1 x 2.0 cm, mildly increased. Hypodense 0.6 cm segment 6 liver lesion (series 2/ image 56), too small to characterize, unchanged. There are 2 new ill-defined hypodense lesions in the posterior right liver lobe, largest 1.7 x 0.9 cm (series 2/ image 56). There is a 0.7 cm nodule at the right liver surface (series 2/ image 59), previously 0.6 cm, not definitely changed. Cholecystectomy. No biliary ductal dilatation. Pancreas: Normal, with no mass or duct dilation. Spleen: Normal size. No mass. Adrenals/Urinary Tract: Normal adrenals. Simple 2.3 cm upper right renal cyst. Hypodense 1.1 cm anterior lower left renal lesion (series 2/ image 71), not appreciably changed in size back to 06/14/2012, suggesting a benign renal cyst. Subcentimeter hypodense renal cortical lesion in the interpolar left kidney is too small to characterize and is stable. No hydronephrosis. Bladder is poorly visualized due to streak artifact from the left hip hardware with no gross bladder abnormality. Stomach/Bowel: Grossly normal stomach. Status post right hemicolectomy with right ventral abdominal wall end ileostomy. Stable prominent diastasis of the right ventral abdominal wall at the ileostomy site. No small bowel dilatation or wall thickening. Oral contrast progresses to the end ileostomy.  There is a 3.3 x 3.1 cm colonic mass at the junction of the descending and sigmoid colon (series 2/image 80), previously 3.3 x 2.9 cm using similar measurement technique, not appreciably changed. Mild sigmoid diverticulosis. Vascular/Lymphatic: Atherosclerotic nonaneurysmal abdominal aorta. Patent portal, splenic, hepatic and renal veins. No pathologically enlarged lymph nodes in the abdomen or pelvis. Reproductive: Status post hysterectomy, with no abnormal findings at the vaginal cuff. No adnexal mass. Other: No pneumoperitoneum, ascites or focal fluid collection. Musculoskeletal: No aggressive appearing focal osseous lesions. Small sclerotic L3 and L5 vertebral body lesions are unchanged since 2011, consistent with benign bone islands. Mild lumbar spondylosis. IMPRESSION: 1. Left liver lobe metastasis is mildly increased in size. Two new ill-defined low-attenuation posterior right liver lobe lesions, cannot exclude new liver metastases. 2. Stable subcentimeter nodule at the right liver capsule, which could represent a liver metastasis or peritoneal tumor implant. 3. Stable 3.3 cm colonic mass at the junction of the sigmoid and descending colon, which could represent a serosal peritoneal tumor implant versus metachronous primary colonic neoplasm. 4. New 3 mm right upper lobe pulmonary nodule, indeterminate, pulmonary metastasis not excluded. Recommend attention on follow-up chest CT in 3-6 months. 5. Additional findings include aortic atherosclerosis, coronary atherosclerosis, stable nonspecific wall thickening in the lower third of the thoracic  esophagus and mild sigmoid diverticulosis. Electronically Signed: By: Ilona Sorrel M.D. On: 09/24/2015 11:47     PATHOLOGY:    ASSESSMENT AND PLAN:  Colon cancer metastasized to liver ([place]) Stage IV CRC, KRAS wild-type, S/P microwave ablation of a solitary liver lesion on 09/01/2014.  S/P palliative Xeloda 1500 mg BID 7 days on and 7 days off beginning 01/2015 with  progression of disease identified in March 2017 resulting in a discontinuation in [place] therapy.  Now on Vectibix every 2 weeks beginning on 04/20/2015 with a dose reduction on 06/15/2015.  Oncology history updated.  Labs will be updated today: CBC diff, CMET, Mg, CEA.  I personally reviewed and went over laboratory results with the patient.  The results are noted within this dictation.  Labs satisfy treatment parameters.  Hypomagnesemia is noted and she is administered 4 g of IV magnesium sulfate.  She will return next week for another dose.  I personally reviewed and went over radiographic studies with the patient.  The results are noted within this dictation.  CT scan is stable except for a mild increase in one hepatic lesion.  There are two new hepatic areas that are concerning for new metastatic disease.  Scans are reviewed in detail with Dr. Thornton [name].  He notes that these area are new, but he cannot definitively say they are sites of metastatic disease based upon appearance.  She notes continued nausea without vomiting.  Order is placed for 8 mg of IV Zofran.  She saw Dr. Laural [name] on 09/18/15 and his note is reviewed.  Code Status has been discussed on multiple occassions and she remains very unrealistic regarding goals of care.  "I want to live as long as I can."  She wants to remain a FULL CODE.  "The good Lord will take me when he is ready, even if I'm on a respiratory."   CT scans are ordered for 2 months for restaging.  Return in 2 weeks for treatment and 4 weeks for treatment and follow-up.  After treatment today, she is attending the funeral of Juanita Craver.   ORDERS PLACED FOR THIS ENCOUNTER: Orders Placed This Encounter  Procedures  . CT Abdomen Pelvis W Contrast    MEDICATIONS PRESCRIBED THIS ENCOUNTER: Meds ordered this encounter  Medications  . ondansetron (ZOFRAN) 8 mg in sodium chloride 0.9 % 50 mL IVPB    THERAPY PLAN:  Will continue with treatment as planned.  All  questions were answered. The patient knows to call the clinic with any problems, questions or concerns. We can certainly see the patient much sooner if necessary.  Patient and plan discussed with Dr. Ancil Linsey and she is in agreement with the aforementioned.   This note is electronically signed by: Doy Mince 09/28/2015 5:59 PM

## 2015-09-29 LAB — CEA: CEA: 212.8 ng/mL — ABNORMAL HIGH (ref 0.0–4.7)

## 2015-10-01 ENCOUNTER — Other Ambulatory Visit (HOSPITAL_COMMUNITY): Payer: Self-pay | Admitting: Oncology

## 2015-10-01 DIAGNOSIS — C787 Secondary malignant neoplasm of liver and intrahepatic bile duct: Principal | ICD-10-CM

## 2015-10-01 DIAGNOSIS — C189 Malignant neoplasm of colon, unspecified: Secondary | ICD-10-CM

## 2015-10-01 MED ORDER — FENTANYL 25 MCG/HR TD PT72: 25.0000 ug | MEDICATED_PATCH | TRANSDERMAL | 0 refills | Status: DC

## 2015-10-01 MED ORDER — HYDROCODONE-ACETAMINOPHEN 7.5-325 MG PO TABS: 1.0000 | ORAL_TABLET | ORAL | 0 refills | Status: DC | PRN

## 2015-10-03 ENCOUNTER — Encounter (HOSPITAL_COMMUNITY)
Admission: RE | Admit: 2015-10-03 | Discharge: 2015-10-03 | Disposition: A | Discharge status: Home / Self Care | Payer: Medicare Other | Source: Ambulatory Visit | Attending: Oncology | Admitting: Oncology

## 2015-10-03 MED ORDER — MAGNESIUM SULFATE 4 GM/100ML IV SOLN
4.0000 g | Freq: Once | INTRAVENOUS | Status: AC
  Administered 2015-10-03: 4 g via INTRAVENOUS
  Filled 2015-10-03: qty 100

## 2015-10-03 MED ORDER — SODIUM CHLORIDE 0.9 % IV SOLN
INTRAVENOUS | Status: DC
  Administered 2015-10-03: 08:00:00 via INTRAVENOUS

## 2015-10-08 ENCOUNTER — Other Ambulatory Visit: Payer: Self-pay

## 2015-10-08 DIAGNOSIS — M1611 Unilateral primary osteoarthritis, right hip: Secondary | ICD-10-CM | POA: Diagnosis not present

## 2015-10-10 ENCOUNTER — Other Ambulatory Visit (HOSPITAL_COMMUNITY): Payer: Self-pay

## 2015-10-10 DIAGNOSIS — C787 Secondary malignant neoplasm of liver and intrahepatic bile duct: Principal | ICD-10-CM

## 2015-10-10 DIAGNOSIS — C189 Malignant neoplasm of colon, unspecified: Secondary | ICD-10-CM

## 2015-10-12 ENCOUNTER — Encounter (HOSPITAL_COMMUNITY): Payer: Medicare Other

## 2015-10-12 ENCOUNTER — Encounter (HOSPITAL_COMMUNITY): Payer: Medicare Other | Attending: Hematology & Oncology

## 2015-10-12 VITALS — BP 142/54 | HR 56 | Temp 98.4°F | Resp 16 | Wt 139.0 lb

## 2015-10-12 DIAGNOSIS — C787 Secondary malignant neoplasm of liver and intrahepatic bile duct: Principal | ICD-10-CM

## 2015-10-12 DIAGNOSIS — C189 Malignant neoplasm of colon, unspecified: Secondary | ICD-10-CM | POA: Insufficient documentation

## 2015-10-12 DIAGNOSIS — R300 Dysuria: Secondary | ICD-10-CM | POA: Insufficient documentation

## 2015-10-12 DIAGNOSIS — Z5112 Encounter for antineoplastic immunotherapy: Secondary | ICD-10-CM | POA: Diagnosis not present

## 2015-10-12 LAB — CBC WITH DIFFERENTIAL/PLATELET
BASOS ABS: 0 10*3/uL (ref 0.0–0.1)
Basophils Relative: 0 %
EOS ABS: 0 10*3/uL (ref 0.0–0.7)
EOS PCT: 0 %
HCT: 34.4 % — ABNORMAL LOW (ref 36.0–46.0)
Hemoglobin: 11.2 g/dL — ABNORMAL LOW (ref 12.0–15.0)
LYMPHS PCT: 13 %
Lymphs Abs: 0.7 10*3/uL (ref 0.7–4.0)
MCH: 29.2 pg (ref 26.0–34.0)
MCHC: 32.6 g/dL (ref 30.0–36.0)
MCV: 89.8 fL (ref 78.0–100.0)
Monocytes Absolute: 0.5 10*3/uL (ref 0.1–1.0)
Monocytes Relative: 10 %
NEUTROS PCT: 77 %
Neutro Abs: 4.1 10*3/uL (ref 1.7–7.7)
PLATELETS: 167 10*3/uL (ref 150–400)
RBC: 3.83 MIL/uL — AB (ref 3.87–5.11)
RDW: 13.6 % (ref 11.5–15.5)
WBC: 5.3 10*3/uL (ref 4.0–10.5)

## 2015-10-12 LAB — COMPREHENSIVE METABOLIC PANEL
ALT: 20 U/L (ref 14–54)
AST: 27 U/L (ref 15–41)
Albumin: 3.5 g/dL (ref 3.5–5.0)
Alkaline Phosphatase: 54 U/L (ref 38–126)
Anion gap: 7 (ref 5–15)
BUN: 27 mg/dL — AB (ref 6–20)
CHLORIDE: 109 mmol/L (ref 101–111)
CO2: 24 mmol/L (ref 22–32)
CREATININE: 0.94 mg/dL (ref 0.44–1.00)
Calcium: 8.7 mg/dL — ABNORMAL LOW (ref 8.9–10.3)
GFR calc Af Amer: >60 mL/min (ref >60)
GFR calc non Af Amer: 55 mL/min — ABNORMAL LOW (ref >60)
Glucose, Bld: 136 mg/dL — ABNORMAL HIGH (ref 65–99)
Potassium: 4 mmol/L (ref 3.5–5.1)
SODIUM: 140 mmol/L (ref 135–145)
Total Bilirubin: 0.4 mg/dL (ref 0.3–1.2)
Total Protein: 6.4 g/dL — ABNORMAL LOW (ref 6.5–8.1)

## 2015-10-12 LAB — URINALYSIS, ROUTINE W REFLEX MICROSCOPIC
BILIRUBIN URINE: NEGATIVE
Glucose, UA: NEGATIVE mg/dL
HGB URINE DIPSTICK: NEGATIVE
Ketones, ur: NEGATIVE mg/dL
Leukocytes, UA: NEGATIVE
Nitrite: NEGATIVE
PH: 6 (ref 5.0–8.0)
Protein, ur: NEGATIVE mg/dL
SPECIFIC GRAVITY, URINE: 1.015 (ref 1.005–1.030)

## 2015-10-12 LAB — MAGNESIUM: MAGNESIUM: 1.2 mg/dL — AB (ref 1.7–2.4)

## 2015-10-12 MED ORDER — DEXTROSE 5 % IV SOLN
4.0000 g | Freq: Once | INTRAVENOUS | Status: AC
  Administered 2015-10-12: 4 g via INTRAVENOUS
  Filled 2015-10-12: qty 8

## 2015-10-12 MED ORDER — SODIUM CHLORIDE 0.9 % IV SOLN
4.9980 mg/kg | Freq: Once | INTRAVENOUS | Status: AC
  Administered 2015-10-12: 340 mg via INTRAVENOUS
  Filled 2015-10-12: qty 17

## 2015-10-12 MED ORDER — HEPARIN SOD (PORK) LOCK FLUSH 100 UNIT/ML IV SOLN: 500.0000 [IU] | Freq: Once | INTRAVENOUS | Status: DC | PRN

## 2015-10-12 MED ORDER — SODIUM CHLORIDE 0.9 % IV SOLN
Freq: Once | INTRAVENOUS | Status: AC
  Administered 2015-10-12: 11:00:00 via INTRAVENOUS

## 2015-10-12 MED ORDER — SODIUM CHLORIDE 0.9% FLUSH: 10.0000 mL | INTRAVENOUS | Status: DC | PRN

## 2015-10-12 NOTE — Patient Instructions (Signed)
[facility] Discharge Instructions for Patients Receiving Chemotherapy   Beginning January 23rd 2017 lab work for the [facility] will be done in the  Main lab at [facility] on 1st floor. If you have a lab appointment with the [facility] please come in thru the  Main Entrance and check in at the main information desk   Today you received the following chemotherapy agents:  Vectibix You also received magnesium 4 g IV today.  Return as scheduled for magnesium infusion next week.  If you develop nausea and vomiting, or diarrhea that is not controlled by your medication, call the clinic.  The clinic phone number is [phone]. Office hours are Monday-Friday 8:30am-5:00pm.  BELOW ARE SYMPTOMS THAT SHOULD BE REPORTED IMMEDIATELY:  *FEVER GREATER THAN 101.0 F  *CHILLS WITH OR WITHOUT FEVER  NAUSEA AND VOMITING THAT IS NOT CONTROLLED WITH YOUR NAUSEA MEDICATION  *UNUSUAL SHORTNESS OF BREATH  *UNUSUAL BRUISING OR BLEEDING  TENDERNESS IN MOUTH AND THROAT WITH OR WITHOUT PRESENCE OF ULCERS  *URINARY PROBLEMS  *BOWEL PROBLEMS  UNUSUAL RASH Items with * indicate a potential emergency and should be followed up as soon as possible. If you have an emergency after office hours please contact your primary care physician or go to the nearest emergency department.  Please call the clinic during office hours if you have any questions or concerns.   You may also contact the Patient Navigator at [phone] should you have any questions or need assistance in obtaining follow up care.      Resources For Cancer Patients and their Caregivers ? American Cancer Society: Can assist with transportation, wigs, general needs, runs Look Good Feel Better.        ([phone] 653-6107 ? Cancer Care: Provides financial assistance, online support groups, medication/co-pay assistance.  1-800-813-HOPE (334)635-0431) ? [place] Assists [place] cancer patients  and their families through emotional , educational and financial support.  351-199-1158 ? [place] DSS Where to apply for food stamps, Medicaid and utility assistance. 615-441-0072 ? RCATS: Transportation to medical appointments. 424-600-1918 ? Social Security Administration: May apply for disability if have a Stage IV cancer. [phone] 3640316512 ? [employer], Disability and Transit Services: Assists with nutrition, care and transit needs. [phone]

## 2015-10-12 NOTE — Progress Notes (Signed)
Tolerated infusions w/o adverse reaction.  Alert, in no distress.  VSS.  Discharged via wheelchair in c/o daughter.  

## 2015-10-12 NOTE — Progress Notes (Signed)
1100:  Pt report that "I think I have a UTI"; requesting a urinalysis be done.  Urine specimen collected and sent to lab.

## 2015-10-13 LAB — CEA: CEA: 227 ng/mL — ABNORMAL HIGH (ref 0.0–4.7)

## 2015-10-17 ENCOUNTER — Encounter (HOSPITAL_BASED_OUTPATIENT_CLINIC_OR_DEPARTMENT_OTHER): Payer: Medicare Other

## 2015-10-17 MED ORDER — MAGNESIUM SULFATE 4 GM/100ML IV SOLN
4.0000 g | Freq: Once | INTRAVENOUS | Status: AC
  Administered 2015-10-17: 4 g via INTRAVENOUS
  Filled 2015-10-17: qty 100

## 2015-10-17 MED ORDER — SODIUM CHLORIDE 0.9 % IV SOLN
INTRAVENOUS | Status: DC
  Administered 2015-10-17: 11:00:00 via INTRAVENOUS

## 2015-10-17 MED ORDER — SODIUM CHLORIDE 0.9 % IV SOLN
4.0000 g | Freq: Once | INTRAVENOUS | Status: DC
  Filled 2015-10-17: qty 8

## 2015-10-17 NOTE — Progress Notes (Signed)
Patient tolerated infusion well.  VSS.  Patient stable and ambulatory, but wheeled out in wheelchair at discharge from clinic.

## 2015-10-17 NOTE — Patient Instructions (Signed)
[place] at [facility] Discharge Instructions  RECOMMENDATIONS MADE BY THE CONSULTANT AND ANY TEST RESULTS WILL BE SENT TO YOUR REFERRING PHYSICIAN.  IV magnesium today.    Thank you for choosing [place] at [facility] to provide your oncology and hematology care.  To afford each patient quality time with our provider, please arrive at least 15 minutes before your scheduled appointment time.   Beginning January 23rd 2017 lab work for the [name] will be done in the  Main lab at [facility] on 1st floor. If you have a lab appointment with the [facility] please come in thru the  Main Entrance and check in at the main information desk  You need to re-schedule your appointment should you arrive 10 or more minutes late.  We strive to give you quality time with our providers, and arriving late affects you and other patients whose appointments are after yours.  Also, if you no show three or more times for appointments you may be dismissed from the clinic at the providers discretion.     Again, thank you for choosing [facility].  Our hope is that these requests will decrease the amount of time that you wait before being seen by our physicians.       _____________________________________________________________  Should you have questions after your visit to [facility], please contact our office at [phone] between the hours of 8:30 a.m. and 4:30 p.m.  Voicemails left after 4:30 p.m. will not be returned until the following business day.  For prescription refill requests, have your pharmacy contact our office.         Resources For Cancer Patients and their Caregivers ? American Cancer Society: Can assist with transportation, wigs, general needs, runs Look Good Feel Better.        609-514-6545 ? Cancer Care: Provides financial assistance, online support groups, medication/co-pay assistance.   1-800-813-HOPE 315-683-8251) ? [place] Assists [place] cancer patients and their families through emotional , educational and financial support.  587-551-0639 ? [place] DSS Where to apply for food stamps, Medicaid and utility assistance. (716)519-5798 ? RCATS: Transportation to medical appointments. (785) 321-2303 ? Social Security Administration: May apply for disability if have a Stage IV cancer. 972-887-3228 (239)642-7946 ? [employer], Disability and Transit Services: Assists with nutrition, care and transit needs. [place] Support Programs: @10RELATIVEDAYS @ > Cancer Support Group  2nd Tuesday of the month 1pm-2pm, [name]  > Creative Journey  3rd Tuesday of the month 1130am-1pm, [name]  > Look Good Feel Better  1st Wednesday of the month 10am-12 noon, [name] (Call [place] to register [phone])

## 2015-10-24 ENCOUNTER — Other Ambulatory Visit: Payer: Self-pay | Admitting: Physician Assistant

## 2015-10-25 ENCOUNTER — Other Ambulatory Visit: Payer: Self-pay

## 2015-10-25 MED ORDER — CARVEDILOL 12.5 MG PO TABS: 18.7500 mg | ORAL_TABLET | Freq: Two times a day (BID) | ORAL | 6 refills | Status: DC

## 2015-10-26 ENCOUNTER — Encounter (HOSPITAL_BASED_OUTPATIENT_CLINIC_OR_DEPARTMENT_OTHER): Payer: Medicare Other | Admitting: Hematology & Oncology

## 2015-10-26 ENCOUNTER — Ambulatory Visit (HOSPITAL_COMMUNITY): Payer: Medicare Other | Admitting: Hematology & Oncology

## 2015-10-26 ENCOUNTER — Other Ambulatory Visit (HOSPITAL_COMMUNITY)
Admission: RE | Admit: 2015-10-26 | Discharge: 2015-10-26 | Disposition: A | Discharge status: Home / Self Care | Payer: Medicare Other | Source: Ambulatory Visit | Attending: Family Medicine | Admitting: Family Medicine

## 2015-10-26 ENCOUNTER — Inpatient Hospital Stay (HOSPITAL_COMMUNITY): Payer: Medicare Other

## 2015-10-26 ENCOUNTER — Encounter (HOSPITAL_COMMUNITY): Payer: Medicare Other

## 2015-10-26 ENCOUNTER — Encounter (HOSPITAL_COMMUNITY): Payer: Self-pay

## 2015-10-26 ENCOUNTER — Encounter (HOSPITAL_BASED_OUTPATIENT_CLINIC_OR_DEPARTMENT_OTHER): Payer: Medicare Other

## 2015-10-26 DIAGNOSIS — C787 Secondary malignant neoplasm of liver and intrahepatic bile duct: Secondary | ICD-10-CM

## 2015-10-26 DIAGNOSIS — R21 Rash and other nonspecific skin eruption: Secondary | ICD-10-CM | POA: Diagnosis not present

## 2015-10-26 DIAGNOSIS — D509 Iron deficiency anemia, unspecified: Secondary | ICD-10-CM

## 2015-10-26 DIAGNOSIS — Z23 Encounter for immunization: Secondary | ICD-10-CM | POA: Diagnosis not present

## 2015-10-26 DIAGNOSIS — R234 Changes in skin texture: Secondary | ICD-10-CM

## 2015-10-26 DIAGNOSIS — C189 Malignant neoplasm of colon, unspecified: Secondary | ICD-10-CM | POA: Diagnosis not present

## 2015-10-26 DIAGNOSIS — E119 Type 2 diabetes mellitus without complications: Secondary | ICD-10-CM | POA: Insufficient documentation

## 2015-10-26 DIAGNOSIS — Z7189 Other specified counseling: Secondary | ICD-10-CM

## 2015-10-26 DIAGNOSIS — D500 Iron deficiency anemia secondary to blood loss (chronic): Secondary | ICD-10-CM | POA: Diagnosis not present

## 2015-10-26 DIAGNOSIS — I10 Essential (primary) hypertension: Secondary | ICD-10-CM | POA: Diagnosis not present

## 2015-10-26 DIAGNOSIS — F329 Major depressive disorder, single episode, unspecified: Secondary | ICD-10-CM

## 2015-10-26 DIAGNOSIS — T451X5A Adverse effect of antineoplastic and immunosuppressive drugs, initial encounter: Secondary | ICD-10-CM

## 2015-10-26 DIAGNOSIS — Z5112 Encounter for antineoplastic immunotherapy: Secondary | ICD-10-CM | POA: Diagnosis present

## 2015-10-26 DIAGNOSIS — R300 Dysuria: Secondary | ICD-10-CM | POA: Diagnosis not present

## 2015-10-26 DIAGNOSIS — R239 Unspecified skin changes: Secondary | ICD-10-CM

## 2015-10-26 LAB — CBC WITH DIFFERENTIAL/PLATELET
Basophils Absolute: 0 10*3/uL (ref 0.0–0.1)
Basophils Relative: 0 %
EOS ABS: 0.2 10*3/uL (ref 0.0–0.7)
EOS PCT: 3 %
HCT: 32.4 % — ABNORMAL LOW (ref 36.0–46.0)
Hemoglobin: 10.5 g/dL — ABNORMAL LOW (ref 12.0–15.0)
LYMPHS ABS: 1 10*3/uL (ref 0.7–4.0)
Lymphocytes Relative: 19 %
MCH: 29.7 pg (ref 26.0–34.0)
MCHC: 32.4 g/dL (ref 30.0–36.0)
MCV: 91.8 fL (ref 78.0–100.0)
MONOS PCT: 13 %
Monocytes Absolute: 0.7 10*3/uL (ref 0.1–1.0)
Neutro Abs: 3.4 10*3/uL (ref 1.7–7.7)
Neutrophils Relative %: 65 %
PLATELETS: 173 10*3/uL (ref 150–400)
RBC: 3.53 MIL/uL — ABNORMAL LOW (ref 3.87–5.11)
RDW: 14 % (ref 11.5–15.5)
WBC: 5.2 10*3/uL (ref 4.0–10.5)

## 2015-10-26 LAB — COMPREHENSIVE METABOLIC PANEL
ALBUMIN: 3.5 g/dL (ref 3.5–5.0)
ALT: 16 U/L (ref 14–54)
ANION GAP: 7 (ref 5–15)
AST: 23 U/L (ref 15–41)
Alkaline Phosphatase: 55 U/L (ref 38–126)
BUN: 16 mg/dL (ref 6–20)
CALCIUM: 8.7 mg/dL — AB (ref 8.9–10.3)
CHLORIDE: 107 mmol/L (ref 101–111)
CO2: 28 mmol/L (ref 22–32)
Creatinine, Ser: 0.89 mg/dL (ref 0.44–1.00)
GFR calc non Af Amer: 58 mL/min — ABNORMAL LOW (ref >60)
GLUCOSE: 95 mg/dL (ref 65–99)
POTASSIUM: 3.9 mmol/L (ref 3.5–5.1)
SODIUM: 142 mmol/L (ref 135–145)
Total Bilirubin: 0.3 mg/dL (ref 0.3–1.2)
Total Protein: 5.9 g/dL — ABNORMAL LOW (ref 6.5–8.1)

## 2015-10-26 LAB — LIPID PANEL
CHOL/HDL RATIO: 2.9 ratio
Cholesterol: 181 mg/dL (ref 0–200)
HDL: 63 mg/dL (ref >40)
LDL CALC: 95 mg/dL (ref 0–99)
TRIGLYCERIDES: 113 mg/dL (ref <150)
VLDL: 23 mg/dL (ref 0–40)

## 2015-10-26 LAB — MAGNESIUM: Magnesium: 1.1 mg/dL — ABNORMAL LOW (ref 1.7–2.4)

## 2015-10-26 MED ORDER — ONDANSETRON HCL 4 MG PO TABS
8.0000 mg | ORAL_TABLET | Freq: Once | ORAL | Status: AC
Start: 2015-10-26 — End: 2015-10-26
  Administered 2015-10-26: 8 mg via ORAL
  Filled 2015-10-26: qty 2

## 2015-10-26 MED ORDER — SODIUM CHLORIDE 0.9 % IV SOLN
Freq: Once | INTRAVENOUS | Status: AC
  Administered 2015-10-26: 11:00:00 via INTRAVENOUS

## 2015-10-26 MED ORDER — SODIUM CHLORIDE 0.9% FLUSH
3.0000 mL | INTRAVENOUS | Status: DC | PRN
  Administered 2015-10-26: 3 mL via INTRAVENOUS
  Filled 2015-10-26: qty 10

## 2015-10-26 MED ORDER — SODIUM CHLORIDE 0.9 % IV SOLN
4.9980 mg/kg | Freq: Once | INTRAVENOUS | Status: AC
  Administered 2015-10-26: 340 mg via INTRAVENOUS
  Filled 2015-10-26: qty 17

## 2015-10-26 MED ORDER — MAGNESIUM SULFATE 4 GM/100ML IV SOLN
4.0000 g | Freq: Once | INTRAVENOUS | Status: AC
  Administered 2015-10-26: 4 g via INTRAVENOUS
  Filled 2015-10-26: qty 100

## 2015-10-26 NOTE — Patient Instructions (Signed)
[place] at [facility] Discharge Instructions  RECOMMENDATIONS MADE BY THE CONSULTANT AND ANY TEST RESULTS WILL BE SENT TO YOUR REFERRING PHYSICIAN. You were seen today by   Thank you for choosing [place] at [facility] to provide your oncology and hematology care.  To afford each patient quality time with our provider, please arrive at least 15 minutes before your scheduled appointment time.   Beginning January 23rd 2017 lab work for the [name] will be done in the  Main lab at [facility] on 1st floor. If you have a lab appointment with the [facility] please come in thru the  Main Entrance and check in at the main information desk  You need to re-schedule your appointment should you arrive 10 or more minutes late.  We strive to give you quality time with our providers, and arriving late affects you and other patients whose appointments are after yours.  Also, if you no show three or more times for appointments you may be dismissed from the clinic at the providers discretion.     Again, thank you for choosing [facility].  Our hope is that these requests will decrease the amount of time that you wait before being seen by our physicians.       _____________________________________________________________  Should you have questions after your visit to [facility], please contact our office at [phone] between the hours of 8:30 a.m. and 4:30 p.m.  Voicemails left after 4:30 p.m. will not be returned until the following business day.  For prescription refill requests, have your pharmacy contact our office.         Resources For Cancer Patients and their Caregivers ? American Cancer Society: Can assist with transportation, wigs, general needs, runs Look Good Feel Better.        3103255769 ? Cancer Care: Provides financial assistance, online support groups, medication/co-pay assistance.   1-800-813-HOPE (867)276-8208) ? [place] Assists [place] cancer patients and their families through emotional , educational and financial support.  (856)274-3362 ? [place] DSS Where to apply for food stamps, Medicaid and utility assistance. 5070762688 ? RCATS: Transportation to medical appointments. (919) 206-9087 ? Social Security Administration: May apply for disability if have a Stage IV cancer. [phone] 5815979483 ? [employer], Disability and Transit Services: Assists with nutrition, care and transit needs. [place] Support Programs: @10RELATIVEDAYS @ > Cancer Support Group  2nd Tuesday of the month 1pm-2pm, [name]  > Creative Journey  3rd Tuesday of the month 1130am-1pm, [name]  > Look Good Feel Better  1st Wednesday of the month 10am-12 noon, [name] (Call [place] to register [phone])

## 2015-10-26 NOTE — Progress Notes (Signed)
Magnesium lab results reviewed with MD. Orders put in for her to receive magnesium next week.   Patient stated that she got nauseated the during the past two treatments. Discussed with MD and got an order for Zofran. Will give prior to starting chemo.        Chemotherapy given today. Patient tolerated well without any complaints. Vitals stable. Will discharge via wheelchair with daughter at side.

## 2015-10-26 NOTE — Patient Instructions (Signed)
[facility] Discharge Instructions for Patients Receiving Chemotherapy   Beginning January 23rd 2017 lab work for the [facility] will be done in the  Main lab at [facility] on 1st floor. If you have a lab appointment with the [facility] please come in thru the  Main Entrance and check in at the main information desk   Today you received the following chemotherapy agents Vectibix and your magnesium  To help prevent nausea and vomiting after your treatment, we encourage you to take your nausea medication      If you develop nausea and vomiting, or diarrhea that is not controlled by your medication, call the clinic.  The clinic phone number is [phone]. Office hours are Monday-Friday 8:30am-5:00pm.  BELOW ARE SYMPTOMS THAT SHOULD BE REPORTED IMMEDIATELY:  *FEVER GREATER THAN 101.0 F  *CHILLS WITH OR WITHOUT FEVER  NAUSEA AND VOMITING THAT IS NOT CONTROLLED WITH YOUR NAUSEA MEDICATION  *UNUSUAL SHORTNESS OF BREATH  *UNUSUAL BRUISING OR BLEEDING  TENDERNESS IN MOUTH AND THROAT WITH OR WITHOUT PRESENCE OF ULCERS  *URINARY PROBLEMS  *BOWEL PROBLEMS  UNUSUAL RASH Items with * indicate a potential emergency and should be followed up as soon as possible. If you have an emergency after office hours please contact your primary care physician or go to the nearest emergency department.  Please call the clinic during office hours if you have any questions or concerns.   You may also contact the Patient Navigator at [phone] should you have any questions or need assistance in obtaining follow up care.      Resources For Cancer Patients and their Caregivers ? American Cancer Society: Can assist with transportation, wigs, general needs, runs Look Good Feel Better.        419-551-9896 ? Cancer Care: Provides financial assistance, online support groups, medication/co-pay assistance.  1-800-813-HOPE 814-385-4650) ? [place] Assists [place] cancer patients and their families through emotional , educational and financial support.  [phone] ? [place] DSS Where to apply for food stamps, Medicaid and utility assistance. 603-808-1910 ? [employer]ATS: Transportation to medical appointments. [phone] ? Social Security Administration: May apply for disability if have a Stage IV cancer. [phone] [phone] ? [employer], Disability and Transit Services: Assists with nutrition, care and transit needs. [phone]

## 2015-10-26 NOTE — Progress Notes (Signed)
[facility] [place], [place] / [place] [address]   DIAGNOSIS: Colon carcinoma with permanent ostomy Diversion Colitis Achalasia Iron deficiency, anemia secondary to GI related blood loss Hemeoccult positive stools Rising CEA with CT imaging on 07/13/2014 1.3 cm mass in L hepatic lobe Subcapsular mass in anterior pole of L kidney suspicious for RCC Cutaneous microwave ablation of her now biopsy-proven metastatic colonic adenocarcinoma to the left liver 09/01/2014   Colon cancer metastasized to liver [facility])   04/12/1987 Definitive Surgery    Dr. Lindalou Hose at [facility]      09/01/2014 Pathology Results    Liver, needle/core biopsy - METASTATIC ADENOCARCINOMA.      09/01/2014 Miscellaneous    MWA left liver lesion, [name] (IR)      01/18/2015 Progression    PET scan      01/18/2015 PET scan    Recurrence of  tumor within segment 2 of the liver. There are 2 new lesions identified within segment 5 and segment 6 of the liver worrisome for additional sites of metastasis. Evidence of tumor recurrence at the intra colonic anastomosis is noted...      01/28/2015 - 04/16/2015 Chemotherapy    Xeloda 1500 mg BID 7 days on and 7 days off beginning in December 2016.      04/12/2015 Imaging    CT C/A/P progression of disease, enlargement of metastatic lesion in segments 2/3 of liver, development of mass with colon at junction of descending colon and sigmoid, indeterminate lesion in lower pole of the left kidney      04/20/2015 -  Antibody Plan    Vectibix every 2 weeks, single-agent.      06/12/2015 - 06/13/2015 Hospital Admission    Hypotension, diarrhea, ARF, UTI      06/15/2015 Adverse Reaction    Vectibix rash      06/15/2015 Treatment Plan Change    Vectibix reduced to 5 mg/m2      07/13/2015 Treatment Plan Change    Treatment deferred due to hypomagnesemia and palpitations.  Escorted to ED.      09/24/2015 Imaging    CT abd/pelvis-  Liver lesions as follows:  Segment 2 left liver lobe hypodense 3.5 x 2.2 cm liver mass, previously 3.1 x 2.0 cm, mildly increased. Medial segment 6 right liver lobe 1.7 x 0.9 cm hypodense liver mass, new. Peripheral segment 6 right liver lobe 1.0 x 0.7 cm hypodense liver mass, new. Serosal 0.7 cm segment 5 right liver lobe mass, previously 0.6 cm, not appreciably changed. Hypodense 0.6 cm liver lesion adjacent to the IVC, previously 0.6 cm, unchanged.      09/24/2015 Imaging    CT chest- New 3 mm right upper lobe pulmonary nodule, indeterminate, pulmonary metastasis not excluded.      CURRENT THERAPY:  Vectibix IV iron prn IV magnesium  History of Present Illness: Carmell returns today for further follow-up of stage IV CRC s/p microwave ablation of a solitary liver lesion. Unfortunately repeat imaging in December revealed other liver disease and recurrence at the prior anastomotic site.  She also has a L renal lesion that is under observation. She has other health issues including chronic LBP. She has progressed through [place]. She is currently on Vectibix.  Ms. Croston is accompanied by her daughter and presents in a treatment chair. She is here for Cycle #14 Panitumumab.   She takes magnesium four times daily. She also  takes a multivitamin that includes magnesium. She still requires on going magnesium infusions.   She complains of an episode of discomfort in the chest in her chest. She attributes it to her magnesium. We have discussed that her hypomagnesemia is a reason to discontinue therapy but Zarahi is very determined to continue, she worries about caring for her husband. She reports two or three weeks ago, she was so depressed she cried for two or three days. This depression is related to watching her husband's health decline.  She still has a rash - generalized all over. She points out breaking out around her neck and on her scalp. She has vectibux  induced hair changes.   She complains of a place on her right ear that is not healing. She previously had a lesion removed from her right ear about 1 year ago and was told this was pre-cancerous. It continues to feel sore and she wears a band-aid on it nightly because she lays on that side. About two weeks ago, it started bleeding while she was in church. She does not feel like going to her dermatologist right now.   Her daughter notes her mother continues to have problems with her bladder. She was checked for UTIs and it came back negative. She is not sure if it could be bladder cancer again. She sees Dr. Diona Fanti of urology in [place].    MEDICAL HISTORY: Past Medical History:  Diagnosis Date  . Abdominal adhesions   . Achalasia    a. s/p botox injection for achalasia.  . Anemia   . Arthritis   . Asthma    hx of  . Blood transfusion   . Breast lump   . Cancer ([place])    bladder  . Chest pain    a. 2002 Cath: nl cors;  b. 2009 aden mv: nl;  c. 05/2009 Echo: nl. d. 2014: normal nuclear stress test, EF 69%.  . Chronic diastolic CHF (congestive heart failure) (HCC)    a. nl EF by echo 05/2009.  . CKD (chronic kidney disease), stage III    pt. states decreased kidney function  . Colon cancer ([place])    a. recurrence 2016 with liver mets -  percutaneous liver biopsy and thermal ablation of a liver metastasis by interventional radiology..  . Colon polyp   . Depression   . Diabetes mellitus without complication ([place]) 2/84/13   borderline type II; takes no medication for it  . DVT of deep femoral vein ([place])   . Family history of bladder cancer   . Family history of breast cancer   . Family history of colon cancer   . Fibromyalgia   . GERD (gastroesophageal reflux disease)   . Headache   . Hearing loss   . Hernia   . History of blood clots   . History of PSVT (paroxysmal supraventricular tachycardia)   . Hyperlipidemia   . Hypertension   . Ileostomy present ([place])   . Iron  deficiency anemia due to chronic blood loss 07/06/2013  . LBBB (left bundle branch block)   . Obstruction of intestine or colon ([place]) 06/2014  . Sinus bradycardia    a. HR 30s in 08/2014 requiring holding of digoxin.  Marland Kitchen Thyroid disease   . UTI (urinary tract infection)   . Vision abnormalities 2016   not going blind but having retina problems; might lose ability to see faces  . Wears dentures     has Left bundle branch block; Chest pain, exertional;  Benign hypertensive heart disease without heart failure; Hypothyroid; Small bowel obstruction, partial ([place]); Dysphagia; Paroxysmal SVT (supraventricular tachycardia) ([place]); Ileostomy status ([place]); Unstable angina ([place]); Parastomal hernia; Iron deficiency anemia due to chronic blood loss; Malabsorption of iron; Acute abdominal pain; Acute renal failure ([place]); Hyperglycemia; UTI (urinary tract infection); Small bowel obstruction ([place]); Abdominal pain, acute; Liver mass, left lobe; Colon cancer metastasized to liver [facility]); Liver lesion; Sinus bradycardia; Family history of colon cancer; Family history of bladder cancer; Family history of breast cancer; Genetic testing; Counseling regarding end of life decision making; Neoplasm related pain; AKI (acute kidney injury) ([place]); Urinary tract infection; Hypotension; Diarrhea; Elevated troponin; CKD (chronic kidney disease) stage 3, GFR 30-59 ml/min; Hypomagnesemia; SBO (small bowel obstruction) (HCC); GERD (gastroesophageal reflux disease); and HTN (hypertension) on her problem list.     is allergic to bactrim [sulfamethoxazole-trimethoprim]; codeine; demerol; lidocaine hcl; morphine and related; dilaudid [hydromorphone hcl]; nitrofurantoin monohyd macro; lisinopril; and ramipril.   Iron Infusion in March 2016. Mammogram performed in 2015. Reported multiple hernias.   SURGICAL HISTORY: Past Surgical History:  Procedure Laterality Date  . ABDOMINAL HYSTERECTOMY    . APPENDECTOMY    . BOTOX INJECTION N/A  09/01/2013   Procedure: BOTOX INJECTION;  Surgeon: Rogene Houston, MD;  Location: [facility] END[facility] SU[facility];  Service: Endoscopy;  Laterality: N/A;  . BREAST SURGERY     right breast biopsy  . CARDIAC CATHETERIZATION  12/10/2000   THE LEFT VENTRICLE IS MILDY DILATED. THERE IS MILD TO MODERATE DIFFUSE HYPOKINESIS WITH EF 35%  . CATARACT EXTRACTION W/PHACO  11/13/2011   Procedure: CATARACT EXTRACTION PHACO AND INTRAOCULAR LENS PLACEMENT (IOC);  Surgeon: Tonny Branch, MD;  Location: [facility] ORS;  Service: Ophthalmology;  Laterality: Right;  CDE 12.26  . CATARACT EXTRACTION W/PHACO  12/08/2011   Procedure: CATARACT EXTRACTION PHACO AND INTRAOCULAR LENS PLACEMENT (IOC);  Surgeon: Tonny Branch, MD;  Location: [facility] ORS;  Service: Ophthalmology;  Laterality: Left;  CDE 15.11  . CHOLECYSTECTOMY    . COLON CANCER SURGERY    . COLON SURGERY    . COLONOSCOPY  09/26/2011   Procedure: COLONOSCOPY;  Surgeon: Rogene Houston, MD;  Location: [facility];  Service: Endoscopy;  Laterality: N/A;  215  . CORONARY ANGIOPLASTY    . ESOPHAGOGASTRODUODENOSCOPY  02/13/2011   Procedure: ESOPHAGOGASTRODUODENOSCOPY (EGD);  Surgeon: Rogene Houston, MD;  Location: [facility];  Service: Endoscopy;  Laterality: N/A;  1030  . ESOPHAGOGASTRODUODENOSCOPY N/A 09/01/2013   Procedure: ESOPHAGOGASTRODUODENOSCOPY (EGD);  Surgeon: Rogene Houston, MD;  Location: [facility];  Service: Endoscopy;  Laterality: N/A;  830  . EYE SURGERY  2014   catarac surgery  . ILEOSTOMY    . ILEOSTOMY    . JOINT REPLACEMENT Left 2008  . liver biopsy and ablation  09/01/14  . ovarian cystectomy 1955    . ROTATOR CUFF REPAIR    . TONSILLECTOMY      SOCIAL HISTORY: Social History   Social History  . Marital status: Married    Spouse name: N/A  . Number of children: 4  . Years of education: N/A   Occupational History  . Not on file.   Social History Main Topics  . Smoking status: Former Smoker    Packs/day: 0.25    Years: 13.00    Types: Cigarettes     Quit date: 02/11/1963  . Smokeless tobacco: Never Used  . Alcohol use No  . Drug use: No  . Sexual activity: Not on file   Other Topics  Concern  . Not on file   Social History Narrative  . No narrative on file   Reads inspirational books Has 15 Great-Grandchildren and 67 Grandchildren  Father died 24 Mother died 55 Oldest brother died 7  FAMILY HISTORY: Family History  Problem Relation Age of Onset  . Heart disease Mother   . Stroke Mother     deceased  . Arthritis Mother   . Bladder Cancer Mother     dx in her 20s  . Colon cancer Mother   . Arthritis Father   . Heart disease Father     decesaed  . Leukemia Father   . Stroke Sister     alive/debilitated  . Hypertension Sister   . Other Sister     paralysis  . Heart disease Brother     bypass surgery  . Arthritis Brother   . Colon cancer Brother   . Bladder Cancer Brother   . Stroke Sister     alive/debilitated  . Diabetes Sister   . Other Brother     stomach problems  . Other Brother     bladder  . Colon cancer Paternal Aunt   . Bladder Cancer Other     dx twice in her 30s-40s  . Breast cancer Other     great niece dx in her early 34s  . Prostate cancer Other     newphew  . Colon polyps Daughter   . Stroke    . Hypertension       Review of Systems  Constitutional: Negative for fever, chills, weight loss and malaise/fatigue.  HENT: Positive for hearing loss. Negative for congestion, nosebleeds, sore throat and tinnitus.   Sore right ear lesion that intermittently bleeds. Eyes: Negative for blurred vision, double vision, pain and discharge.  Respiratory: Negative for cough, hemoptysis, sputum production, shortness of breath and wheezing.   Cardiovascular: Positive for chest pain. Negative for palpitations, claudication, leg swelling and PND.  Gastrointestinal:  Positive for nausea. Negative for heartburn, abdominal pain, diarrhea, constipation, blood in stool and melena. ALTERED TASTE Nausea secondary  to chemotherapy (Improved) Genitourinary: Negative for dysuria, urgency, frequency and hematuria.  Musculoskeletal: Positive for joint pain. Negative for myalgias and falls.  Skin: EGFR therapy related changes Neurological: Positive for weakness. Negative for dizziness, tingling, tremors, sensory change, speech change, focal weakness, seizures, loss of consciousness and headaches.  Endo/Heme/Allergies: Does not bruise/bleed easily.  Psychiatric/Behavioral: Positive for depression and insomnia. Negative for suicidal ideas, memory loss and substance abuse. The patient is not nervous/anxious. Depression related to her husband's declining health  14 point review of systems was performed and is negative except as detailed under history of present illness and above   PHYSICAL EXAMINATION Vitals with BMI 10/26/2015 10/26/2015 10/26/2015  Height     Weight     BMI     Systolic [phone]  Diastolic 63 98 46  Pulse  69 71  Respirations  16 16    ECOG PERFORMANCE STATUS: 2 - Symptomatic, <50% confined to bed   Physical Exam  Constitutional: She is oriented to person, place, and time and well-developed, well-nourished, and in no distress. HENT:  Head: Normocephalic and atraumatic. R ear with small deformity from prior surgery, area with eschar, suspicious Nose: Nose normal.  Mouth/Throat: Oropharynx is clear and moist. No oropharyngeal exudate.  Eyes: Conjunctivae and EOM are normal. Pupils are equal, round, and reactive to light. Right eye exhibits no discharge. Left eye exhibits no discharge. No scleral icterus.  Neck: Normal range of motion. Neck supple. No tracheal deviation present. No thyromegaly present.  Cardiovascular: Normal rate, regular rhythm and normal heart sounds.  Exam reveals no gallop and no friction rub.   No murmur heard. Pulmonary/Chest: Effort normal and breath sounds normal. She has no wheezes. She has no rales.  Abdominal: Soft. Bowel sounds are normal. . There is no  tenderness. There is no rebound and no guarding. ostomy site is intact with watery dark stool. Hernia noted. Incision sites all WNL Musculoskeletal: Normal range of motion. She exhibits no edema.  Lymphadenopathy:    She has no cervical adenopathy.  Neurological: She is alert and oriented to person, place, and time. She has normal reflexes. No cranial nerve deficit.  Skin: Skin is warm and dry. Mild vectibix facial rash. Mild associated nail changes/periungual splitting (grade 1) eyelashes are long, thickened Psychiatric: Mood, memory, affect and judgment normal.  Nursing note and vitals reviewed.   LABORATORY DATA:  Results for ZAYLEY, ARRAS (MRN [id number])   Ref. Range 10/26/2015 10:08  Sodium Latest Ref Range: 135 - 145 mmol/L 142  Potassium Latest Ref Range: 3.5 - 5.1 mmol/L 3.9  Chloride Latest Ref Range: 101 - 111 mmol/L 107  CO2 Latest Ref Range: 22 - 32 mmol/L 28  Mean Plasma Glucose Latest Units: mg/dL 114  BUN Latest Ref Range: 6 - 20 mg/dL 16  Creatinine Latest Ref Range: 0.44 - 1.00 mg/dL 0.89  Calcium Latest Ref Range: 8.9 - 10.3 mg/dL 8.7 (L)  EGFR (Non-African Amer.) Latest Ref Range: >60 mL/min 58 (L)  EGFR (African American) Latest Ref Range: >60 mL/min >60  Glucose Latest Ref Range: 65 - 99 mg/dL 95  Anion gap Latest Ref Range: 5 - 15  7  Alkaline Phosphatase Latest Ref Range: 38 - 126 U/L 55  Albumin Latest Ref Range: 3.5 - 5.0 g/dL 3.5  AST Latest Ref Range: 15 - 41 U/L 23  ALT Latest Ref Range: 14 - 54 U/L 16  Total Protein Latest Ref Range: 6.5 - 8.1 g/dL 5.9 (L)  Total Bilirubin Latest Ref Range: 0.3 - 1.2 mg/dL 0.3  WBC Latest Ref Range: 4.0 - 10.5 K/uL 5.2  RBC Latest Ref Range: 3.87 - 5.11 MIL/uL 3.53 (L)  Hemoglobin Latest Ref Range: 12.0 - 15.0 g/dL 10.5 (L)  HCT Latest Ref Range: 36.0 - 46.0 % 32.4 (L)  MCV Latest Ref Range: 78.0 - 100.0 fL 91.8  MCH Latest Ref Range: 26.0 - 34.0 pg 29.7  MCHC Latest Ref Range: 30.0 - 36.0 g/dL 32.4  RDW Latest  Ref Range: 11.5 - 15.5 % 14.0  Platelets Latest Ref Range: 150 - 400 K/uL 173  Neutrophils Latest Units: % 65  Lymphocytes Latest Units: % 19  Monocytes Relative Latest Units: % 13  Eosinophil Latest Units: % 3  Basophil Latest Units: % 0  NEUT# Latest Ref Range: 1.7 - 7.7 K/uL 3.4  Lymphocyte # Latest Ref Range: 0.7 - 4.0 K/uL 1.0  Monocyte # Latest Ref Range: 0.1 - 1.0 K/uL 0.7  Eosinophils Absolute Latest Ref Range: 0.0 - 0.7 K/uL 0.2  Basophils Absolute Latest Ref Range: 0.0 - 0.1 K/uL 0.0    I have reviewed the data as listed. Results for ILHAM, ROUGHTON (MRN [id number]) as of 10/26/2015 14:03  Ref. Range 08/17/2015 09:40 08/31/2015 08:10 09/14/2015 08:09 09/28/2015 08:14 10/12/2015 09:14  CEA Latest Ref Range: 0.0 - 4.7 ng/mL 209.6 (H) 188.4 (H) 168.7 (H) 212.8 (H) 227.0 (H)   Results for  HARLEEN, FINEBERG (MRN [id number]) as of 10/26/2015 14:03  Ref. Range 09/14/2015 08:09 09/28/2015 08:13 09/28/2015 08:14 10/12/2015 09:14 10/26/2015 10:07  Magnesium Latest Ref Range: 1.7 - 2.4 mg/dL 1.2 (L)  1.1 (L) 1.2 (L) 1.1 (L)   RADIOLOGY: I have reviewed the images detailed below and agree with the results: Addendum   ADDENDUM REPORT: 09/28/2015 08:09  ADDENDUM: Liver lesions as follows:  Segment 2 left liver lobe hypodense 3.5 x 2.2 cm liver mass (series 2/image 50), previously 3.1 x 2.0 cm, mildly increased.  Medial segment 6 right liver lobe 1.7 x 0.9 cm hypodense liver mass (series 2/image 56), new.  Peripheral segment 6 right liver lobe 1.0 x 0.7 cm hypodense liver mass (series 2/image 50), new.  Serosal 0.7 cm segment 5 right liver lobe mass (series 2/image 59), previously 0.6 cm, not appreciably changed.  Hypodense 0.6 cm liver lesion adjacent to the IVC, previously 0.6 cm, unchanged.  The original report is otherwise unchanged.   Electronically Signed   By: Ilona Sorrel M.D.   On: 09/28/2015 08:09   Addended by Sharyn Blitz, MD on 09/28/2015 8:12 AM      Study Result   CLINICAL DATA:  Re- stage stage IV colorectal cancer.  EXAM: CT CHEST, ABDOMEN, AND PELVIS WITH CONTRAST  TECHNIQUE: Multidetector CT imaging of the chest, abdomen and pelvis was performed following the standard protocol during bolus administration of intravenous contrast.  CONTRAST:  115m ISOVUE-300 IOPAMIDOL (ISOVUE-300) INJECTION 61%  COMPARISON:  04/12/2015 CT chest, abdomen and pelvis.  FINDINGS: CT CHEST FINDINGS  Mediastinum/Nodes: Stable mild cardiomegaly. No significant pericardial fluid/thickening. Coronary atherosclerosis . Atherosclerotic nonaneurysmal thoracic aorta. Normal caliber pulmonary arteries. No central pulmonary emboli. No discrete thyroid nodules. Stable mild circumferential wall thickening in the lower third of the thoracic esophagus. No pathologically enlarged axillary, mediastinal or hilar lymph nodes. Stable 0.6 cm right pericardiophrenic lymph node, top-normal.  Lungs/Pleura: No pneumothorax. No pleural effusion. New subpleural right upper lobe 3 mm pulmonary nodule (series 6/ image 45). Stable parenchymal band in the basilar right lower lobe. Stable 3 mm subpleural posterior left lower lobe pulmonary nodule (series 6/ image 104). No acute consolidative airspace disease or additional significant pulmonary nodules.  Musculoskeletal: No aggressive appearing focal osseous lesions. Moderate mid thoracic spondylosis.  CT ABDOMEN PELVIS FINDINGS  Hepatobiliary: Segment 2 left liver lobe heterogeneous hypodense liver mass (series 2/ image 50), previously 3.1 x 2.0 cm, mildly increased. Hypodense 0.6 cm segment 6 liver lesion (series 2/ image 56), too small to characterize, unchanged. There are 2 new ill-defined hypodense lesions in the posterior right liver lobe, largest 1.7 x 0.9 cm (series 2/ image 56). There is a 0.7 cm nodule at the right liver surface (series 2/ image 59), previously 0.6 cm, not definitely changed.  Cholecystectomy. No biliary ductal dilatation.  Pancreas: Normal, with no mass or duct dilation.  Spleen: Normal size. No mass.  Adrenals/Urinary Tract: Normal adrenals. Simple 2.3 cm upper right renal cyst. Hypodense 1.1 cm anterior lower left renal lesion (series 2/ image 71), not appreciably changed in size back to 06/14/2012, suggesting a benign renal cyst. Subcentimeter hypodense renal cortical lesion in the interpolar left kidney is too small to characterize and is stable. No hydronephrosis. Bladder is poorly visualized due to streak artifact from the left hip hardware with no gross bladder abnormality.  Stomach/Bowel: Grossly normal stomach. Status post right hemicolectomy with right ventral abdominal wall end ileostomy. Stable prominent diastasis of the right ventral abdominal wall at the  ileostomy site. No small bowel dilatation or wall thickening. Oral contrast progresses to the end ileostomy. There is a 3.3 x 3.1 cm colonic mass at the junction of the descending and sigmoid colon (series 2/image 80), previously 3.3 x 2.9 cm using similar measurement technique, not appreciably changed. Mild sigmoid diverticulosis.  Vascular/Lymphatic: Atherosclerotic nonaneurysmal abdominal aorta. Patent portal, splenic, hepatic and renal veins. No pathologically enlarged lymph nodes in the abdomen or pelvis.  Reproductive: Status post hysterectomy, with no abnormal findings at the vaginal cuff. No adnexal mass.  Other: No pneumoperitoneum, ascites or focal fluid collection.  Musculoskeletal: No aggressive appearing focal osseous lesions. Small sclerotic L3 and L5 vertebral body lesions are unchanged since 2011, consistent with benign bone islands. Mild lumbar spondylosis.  IMPRESSION: 1. Left liver lobe metastasis is mildly increased in size. Two new ill-defined low-attenuation posterior right liver lobe lesions, cannot exclude new liver metastases. 2. Stable  subcentimeter nodule at the right liver capsule, which could represent a liver metastasis or peritoneal tumor implant. 3. Stable 3.3 cm colonic mass at the junction of the sigmoid and descending colon, which could represent a serosal peritoneal tumor implant versus metachronous primary colonic neoplasm. 4. New 3 mm right upper lobe pulmonary nodule, indeterminate, pulmonary metastasis not excluded. Recommend attention on follow-up chest CT in 3-6 months. 5. Additional findings include aortic atherosclerosis, coronary atherosclerosis, stable nonspecific wall thickening in the lower third of the thoracic esophagus and mild sigmoid diverticulosis.  Electronically Signed: By: Ilona Sorrel M.D. On: 09/24/2015 11:47       ASSESSMENT and THERAPY PLAN:  Colon carcinoma with permanent ostomy Diversion Colitis Achalasia Iron deficiency, anemia secondary to GI related blood loss Hemeoccult positive stools Rising CEA with CT imaging on 07/13/2014 1.3 cm mass in L hepatic lobe Subcapsular mass in anterior pole of L kidney suspicious for RCC Biopsy of L liver lesion and MWA Left liver lesion 09/01/2014 Taste Alteration KRAS WT XELODA therapy, week on, week off started 01/2015 Progressive disease Difficulty discussing end of life decision making [facility] admission for diarrhea ? Vectibix induced Hypomagnesemia secondary to Vectibix therapy  We again reviewed side effects of the multiple therapy options for stage IV CRC. She would not do well with FOLFOX, FOLFIRI, I also do not believe she would tolerate single agent irinotecan.  I again reviewed side effects of vectibix including diarrhea and advised her that there are no dose modifications for diarrhea. I did dose adjust her however. She for now seems to be doing ok. I have emphasized the importance of notifying us or coming to the ED if she cannot stop her diarrhea with immodium.  Will continue to replace magnesium. I have  discussed discontinuing therapy many times with the patient and she is reluctant to do so. She feels a great need to continue to care for her husband. She states that she is taking her oral magnesium.   We discussed end of life issues again, she remains resistant. I unfortunately do not think she will ever come to terms with end of life issues or DNR status.  I have encouraged the patient to have the right ear lesion looked at, currently she is reluctant.   I have encouraged the patient to follow up with urology to evaluate her bladder issues.   She is scheduled for a screening mammogram on 10/29/2015. She is scheduled for CT Abdomen / Pelvis on 11/28/2015. She is scheduled for follow up in GI with Dr. Laural Golden on 01/08/2016.  She will  continue Vectibix and return for follow up post CT scans to discuss these results.  All questions were answered. The patient knows to call the clinic with any problems, questions or concerns. We can certainly see the patient much sooner if necessary.  This document serves as a record of services personally performed by Ancil Linsey, MD. It was created on her behalf by Arlyce Harman, a trained medical scribe. The creation of this record is based on the scribe's personal observations and the provider's statements to them. This document has been checked and approved by the attending provider.  I have reviewed the above documentation for accuracy and completeness, and I agree with the above.  This document was electronically signed.   Kelby Fam. Whitney Muse, MD

## 2015-10-27 LAB — HEMOGLOBIN A1C
HEMOGLOBIN A1C: 5.6 % (ref 4.8–5.6)
MEAN PLASMA GLUCOSE: 114 mg/dL

## 2015-10-29 ENCOUNTER — Ambulatory Visit (HOSPITAL_COMMUNITY)
Admission: RE | Admit: 2015-10-29 | Discharge: 2015-10-29 | Disposition: A | Discharge status: Home / Self Care | Payer: Medicare Other | Source: Ambulatory Visit | Attending: Family Medicine | Admitting: Family Medicine

## 2015-10-29 ENCOUNTER — Encounter (HOSPITAL_BASED_OUTPATIENT_CLINIC_OR_DEPARTMENT_OTHER): Payer: Medicare Other

## 2015-10-29 ENCOUNTER — Encounter (HOSPITAL_COMMUNITY): Payer: Self-pay

## 2015-10-29 VITALS — BP 131/48 | HR 69 | Temp 98.0°F | Resp 18 | Wt 138.0 lb

## 2015-10-29 DIAGNOSIS — C787 Secondary malignant neoplasm of liver and intrahepatic bile duct: Principal | ICD-10-CM

## 2015-10-29 DIAGNOSIS — Z1231 Encounter for screening mammogram for malignant neoplasm of breast: Secondary | ICD-10-CM

## 2015-10-29 DIAGNOSIS — C189 Malignant neoplasm of colon, unspecified: Secondary | ICD-10-CM

## 2015-10-29 MED ORDER — SODIUM CHLORIDE 0.9 % IV SOLN
4.0000 g | Freq: Once | INTRAVENOUS | Status: AC
  Administered 2015-10-29: 4 g via INTRAVENOUS
  Filled 2015-10-29: qty 8

## 2015-10-29 NOTE — Progress Notes (Signed)
IV magnesium given today per orders. Patient tolerated well. No problems noted. VSS, diacharged from clinic via wheelchair with daughter.

## 2015-10-29 NOTE — Patient Instructions (Signed)
[place] at [facility] Discharge Instructions  RECOMMENDATIONS MADE BY THE CONSULTANT AND ANY TEST RESULTS WILL BE SENT TO YOUR REFERRING PHYSICIAN.  IV Magnesium given today. Follow up as scheduled.  Thank you for choosing [place] at [facility] to provide your oncology and hematology care.  To afford each patient quality time with our provider, please arrive at least 15 minutes before your scheduled appointment time.   Beginning January 23rd 2017 lab work for the [name] will be done in the  Main lab at [facility] on 1st floor. If you have a lab appointment with the [facility] please come in thru the  Main Entrance and check in at the main information desk  You need to re-schedule your appointment should you arrive 10 or more minutes late.  We strive to give you quality time with our providers, and arriving late affects you and other patients whose appointments are after yours.  Also, if you no show three or more times for appointments you may be dismissed from the clinic at the providers discretion.     Again, thank you for choosing [facility].  Our hope is that these requests will decrease the amount of time that you wait before being seen by our physicians.       _____________________________________________________________  Should you have questions after your visit to [facility], please contact our office at [phone] between the hours of 8:30 a.m. and 4:30 p.m.  Voicemails left after 4:30 p.m. will not be returned until the following business day.  For prescription refill requests, have your pharmacy contact our office.         Resources For Cancer Patients and their Caregivers ? American Cancer Society: Can assist with transportation, wigs, general needs, runs Look Good Feel Better.        (928)594-5464 ? Cancer Care: Provides financial assistance, online support groups,  medication/co-pay assistance.  1-800-813-HOPE 4345432332) ? [place] Assists [place] cancer patients and their families through emotional , educational and financial support.  (825)616-7406 ? [place] DSS Where to apply for food stamps, Medicaid and utility assistance. (218)382-0917 ? [facility]: Transportation to medical appointments. 804-670-6417 ? Social Security Administration: May apply for disability if have a Stage IV cancer. 201-502-6002 (206)560-4400 ? [employer], Disability and Transit Services: Assists with nutrition, care and transit needs. [place] Support Programs: @10RELATIVEDAYS @ > Cancer Support Group  2nd Tuesday of the month 1pm-2pm, [name]  > Creative Journey  3rd Tuesday of the month 1130am-1pm, [name]  > Look Good Feel Better  1st Wednesday of the month 10am-12 noon, [name] (Call [place] to register [phone])

## 2015-10-30 ENCOUNTER — Ambulatory Visit (HOSPITAL_COMMUNITY): Payer: Medicare Other

## 2015-10-31 DIAGNOSIS — E039 Hypothyroidism, unspecified: Secondary | ICD-10-CM | POA: Diagnosis not present

## 2015-10-31 DIAGNOSIS — I10 Essential (primary) hypertension: Secondary | ICD-10-CM | POA: Diagnosis not present

## 2015-10-31 DIAGNOSIS — Z1389 Encounter for screening for other disorder: Secondary | ICD-10-CM | POA: Diagnosis not present

## 2015-10-31 DIAGNOSIS — Z23 Encounter for immunization: Secondary | ICD-10-CM | POA: Diagnosis not present

## 2015-10-31 DIAGNOSIS — E119 Type 2 diabetes mellitus without complications: Secondary | ICD-10-CM | POA: Diagnosis not present

## 2015-10-31 DIAGNOSIS — M545 Low back pain: Secondary | ICD-10-CM | POA: Diagnosis not present

## 2015-11-05 ENCOUNTER — Emergency Department (HOSPITAL_COMMUNITY): Payer: Medicare Other

## 2015-11-05 ENCOUNTER — Inpatient Hospital Stay (HOSPITAL_COMMUNITY)
Admission: EM | Admit: 2015-11-05 | Discharge: 2015-11-07 | DRG: 389 | Disposition: A | Discharge status: Home / Self Care | Payer: Medicare Other | Attending: Family Medicine | Admitting: Family Medicine

## 2015-11-05 ENCOUNTER — Inpatient Hospital Stay (HOSPITAL_COMMUNITY): Payer: Medicare Other

## 2015-11-05 ENCOUNTER — Encounter (HOSPITAL_COMMUNITY): Payer: Self-pay | Admitting: Emergency Medicine

## 2015-11-05 DIAGNOSIS — Z833 Family history of diabetes mellitus: Secondary | ICD-10-CM

## 2015-11-05 DIAGNOSIS — Z932 Ileostomy status: Secondary | ICD-10-CM | POA: Diagnosis not present

## 2015-11-05 DIAGNOSIS — Z9861 Coronary angioplasty status: Secondary | ICD-10-CM

## 2015-11-05 DIAGNOSIS — I130 Hypertensive heart and chronic kidney disease with heart failure and stage 1 through stage 4 chronic kidney disease, or unspecified chronic kidney disease: Secondary | ICD-10-CM | POA: Diagnosis present

## 2015-11-05 DIAGNOSIS — Z8371 Family history of colonic polyps: Secondary | ICD-10-CM

## 2015-11-05 DIAGNOSIS — Z79891 Long term (current) use of opiate analgesic: Secondary | ICD-10-CM

## 2015-11-05 DIAGNOSIS — I10 Essential (primary) hypertension: Secondary | ICD-10-CM | POA: Diagnosis not present

## 2015-11-05 DIAGNOSIS — R109 Unspecified abdominal pain: Secondary | ICD-10-CM | POA: Diagnosis present

## 2015-11-05 DIAGNOSIS — Z8249 Family history of ischemic heart disease and other diseases of the circulatory system: Secondary | ICD-10-CM

## 2015-11-05 DIAGNOSIS — Z9221 Personal history of antineoplastic chemotherapy: Secondary | ICD-10-CM

## 2015-11-05 DIAGNOSIS — Z87891 Personal history of nicotine dependence: Secondary | ICD-10-CM

## 2015-11-05 DIAGNOSIS — E1122 Type 2 diabetes mellitus with diabetic chronic kidney disease: Secondary | ICD-10-CM | POA: Diagnosis present

## 2015-11-05 DIAGNOSIS — E039 Hypothyroidism, unspecified: Secondary | ICD-10-CM | POA: Diagnosis present

## 2015-11-05 DIAGNOSIS — K5669 Other intestinal obstruction: Secondary | ICD-10-CM | POA: Diagnosis not present

## 2015-11-05 DIAGNOSIS — K565 Intestinal adhesions [bands] with obstruction (postprocedural) (postinfection): Principal | ICD-10-CM | POA: Diagnosis present

## 2015-11-05 DIAGNOSIS — K56609 Unspecified intestinal obstruction, unspecified as to partial versus complete obstruction: Secondary | ICD-10-CM | POA: Diagnosis present

## 2015-11-05 DIAGNOSIS — K5660 Unspecified intestinal obstruction: Secondary | ICD-10-CM | POA: Diagnosis not present

## 2015-11-05 DIAGNOSIS — N183 Chronic kidney disease, stage 3 (moderate): Secondary | ICD-10-CM | POA: Diagnosis present

## 2015-11-05 DIAGNOSIS — F329 Major depressive disorder, single episode, unspecified: Secondary | ICD-10-CM | POA: Diagnosis present

## 2015-11-05 DIAGNOSIS — K219 Gastro-esophageal reflux disease without esophagitis: Secondary | ICD-10-CM | POA: Diagnosis not present

## 2015-11-05 DIAGNOSIS — C787 Secondary malignant neoplasm of liver and intrahepatic bile duct: Secondary | ICD-10-CM | POA: Diagnosis present

## 2015-11-05 DIAGNOSIS — E785 Hyperlipidemia, unspecified: Secondary | ICD-10-CM | POA: Diagnosis present

## 2015-11-05 DIAGNOSIS — Z803 Family history of malignant neoplasm of breast: Secondary | ICD-10-CM

## 2015-11-05 DIAGNOSIS — R100 Acute abdomen: Secondary | ICD-10-CM

## 2015-11-05 DIAGNOSIS — M797 Fibromyalgia: Secondary | ICD-10-CM | POA: Diagnosis present

## 2015-11-05 DIAGNOSIS — Z86718 Personal history of other venous thrombosis and embolism: Secondary | ICD-10-CM

## 2015-11-05 DIAGNOSIS — Z8261 Family history of arthritis: Secondary | ICD-10-CM

## 2015-11-05 DIAGNOSIS — C189 Malignant neoplasm of colon, unspecified: Secondary | ICD-10-CM | POA: Diagnosis present

## 2015-11-05 DIAGNOSIS — Z7982 Long term (current) use of aspirin: Secondary | ICD-10-CM

## 2015-11-05 DIAGNOSIS — Z8551 Personal history of malignant neoplasm of bladder: Secondary | ICD-10-CM | POA: Diagnosis not present

## 2015-11-05 DIAGNOSIS — I2510 Atherosclerotic heart disease of native coronary artery without angina pectoris: Secondary | ICD-10-CM | POA: Diagnosis present

## 2015-11-05 DIAGNOSIS — H9190 Unspecified hearing loss, unspecified ear: Secondary | ICD-10-CM | POA: Diagnosis present

## 2015-11-05 DIAGNOSIS — Z90710 Acquired absence of both cervix and uterus: Secondary | ICD-10-CM

## 2015-11-05 DIAGNOSIS — I5032 Chronic diastolic (congestive) heart failure: Secondary | ICD-10-CM | POA: Diagnosis present

## 2015-11-05 DIAGNOSIS — Z4682 Encounter for fitting and adjustment of non-vascular catheter: Secondary | ICD-10-CM | POA: Diagnosis not present

## 2015-11-05 DIAGNOSIS — I700 Atherosclerosis of aorta: Secondary | ICD-10-CM | POA: Diagnosis present

## 2015-11-05 DIAGNOSIS — J45909 Unspecified asthma, uncomplicated: Secondary | ICD-10-CM | POA: Diagnosis present

## 2015-11-05 DIAGNOSIS — Z96642 Presence of left artificial hip joint: Secondary | ICD-10-CM | POA: Diagnosis present

## 2015-11-05 DIAGNOSIS — Z823 Family history of stroke: Secondary | ICD-10-CM | POA: Diagnosis not present

## 2015-11-05 DIAGNOSIS — Z806 Family history of leukemia: Secondary | ICD-10-CM

## 2015-11-05 DIAGNOSIS — Z8052 Family history of malignant neoplasm of bladder: Secondary | ICD-10-CM | POA: Diagnosis not present

## 2015-11-05 DIAGNOSIS — Z800 Family history of malignant neoplasm of digestive organs: Secondary | ICD-10-CM

## 2015-11-05 DIAGNOSIS — E876 Hypokalemia: Secondary | ICD-10-CM | POA: Diagnosis not present

## 2015-11-05 LAB — URINALYSIS, ROUTINE W REFLEX MICROSCOPIC
BILIRUBIN URINE: NEGATIVE
Glucose, UA: NEGATIVE mg/dL
Hgb urine dipstick: NEGATIVE
KETONES UR: NEGATIVE mg/dL
Leukocytes, UA: NEGATIVE
NITRITE: NEGATIVE
PROTEIN: NEGATIVE mg/dL
Specific Gravity, Urine: 1.015 (ref 1.005–1.030)
pH: 6 (ref 5.0–8.0)

## 2015-11-05 LAB — CBC WITH DIFFERENTIAL/PLATELET
BASOS PCT: 0 %
Basophils Absolute: 0 10*3/uL (ref 0.0–0.1)
EOS ABS: 0.2 10*3/uL (ref 0.0–0.7)
Eosinophils Relative: 3 %
HCT: 33.4 % — ABNORMAL LOW (ref 36.0–46.0)
HEMOGLOBIN: 10.9 g/dL — AB (ref 12.0–15.0)
Lymphocytes Relative: 16 %
Lymphs Abs: 0.8 10*3/uL (ref 0.7–4.0)
MCH: 29.9 pg (ref 26.0–34.0)
MCHC: 32.6 g/dL (ref 30.0–36.0)
MCV: 91.8 fL (ref 78.0–100.0)
Monocytes Absolute: 0.6 10*3/uL (ref 0.1–1.0)
Monocytes Relative: 12 %
NEUTROS PCT: 69 %
Neutro Abs: 3.6 10*3/uL (ref 1.7–7.7)
Platelets: 182 10*3/uL (ref 150–400)
RBC: 3.64 MIL/uL — AB (ref 3.87–5.11)
RDW: 13.5 % (ref 11.5–15.5)
WBC: 5.1 10*3/uL (ref 4.0–10.5)

## 2015-11-05 LAB — COMPREHENSIVE METABOLIC PANEL
ALBUMIN: 3.8 g/dL (ref 3.5–5.0)
ALK PHOS: 59 U/L (ref 38–126)
ALT: 16 U/L (ref 14–54)
AST: 23 U/L (ref 15–41)
Anion gap: 8 (ref 5–15)
BUN: 18 mg/dL (ref 6–20)
CALCIUM: 8.7 mg/dL — AB (ref 8.9–10.3)
CO2: 26 mmol/L (ref 22–32)
CREATININE: 0.82 mg/dL (ref 0.44–1.00)
Chloride: 105 mmol/L (ref 101–111)
Glucose, Bld: 121 mg/dL — ABNORMAL HIGH (ref 65–99)
Potassium: 3.8 mmol/L (ref 3.5–5.1)
SODIUM: 139 mmol/L (ref 135–145)
Total Bilirubin: 0.6 mg/dL (ref 0.3–1.2)
Total Protein: 6.8 g/dL (ref 6.5–8.1)

## 2015-11-05 LAB — TSH: TSH: 5.209 u[IU]/mL — ABNORMAL HIGH (ref 0.350–4.500)

## 2015-11-05 LAB — LIPASE, BLOOD: LIPASE: 18 U/L (ref 11–51)

## 2015-11-05 MED ORDER — SODIUM CHLORIDE 0.9 % IV SOLN
INTRAVENOUS | Status: DC
Start: 2015-11-05 — End: 2015-11-05

## 2015-11-05 MED ORDER — ONDANSETRON HCL 4 MG/2ML IJ SOLN
4.0000 mg | Freq: Once | INTRAMUSCULAR | Status: AC
  Administered 2015-11-05: 4 mg via INTRAVENOUS

## 2015-11-05 MED ORDER — FAMOTIDINE IN NACL 20-0.9 MG/50ML-% IV SOLN: 20.0000 mg | INTRAVENOUS | Status: DC

## 2015-11-05 MED ORDER — IOPAMIDOL (ISOVUE-300) INJECTION 61%
100.0000 mL | Freq: Once | INTRAVENOUS | Status: AC | PRN
  Administered 2015-11-05: 100 mL via INTRAVENOUS

## 2015-11-05 MED ORDER — POTASSIUM CHLORIDE IN NACL 20-0.9 MEQ/L-% IV SOLN
INTRAVENOUS | Status: DC
  Administered 2015-11-05 – 2015-11-06 (×2): via INTRAVENOUS

## 2015-11-05 MED ORDER — ENOXAPARIN SODIUM 40 MG/0.4ML ~~LOC~~ SOLN
40.0000 mg | SUBCUTANEOUS | Status: DC
  Administered 2015-11-05 – 2015-11-06 (×2): 40 mg via SUBCUTANEOUS
  Filled 2015-11-05 (×2): qty 0.4

## 2015-11-05 MED ORDER — ONDANSETRON HCL 4 MG/2ML IJ SOLN
4.0000 mg | Freq: Once | INTRAMUSCULAR | Status: AC
  Administered 2015-11-05: 4 mg via INTRAVENOUS
  Filled 2015-11-05: qty 2

## 2015-11-05 MED ORDER — ONDANSETRON HCL 4 MG/2ML IJ SOLN
INTRAMUSCULAR | Status: AC
  Administered 2015-11-05: 17:00:00
  Filled 2015-11-05: qty 2

## 2015-11-05 MED ORDER — FENTANYL CITRATE (PF) 100 MCG/2ML IJ SOLN
12.5000 ug | INTRAMUSCULAR | Status: DC | PRN
  Administered 2015-11-05: 12.5 ug via INTRAVENOUS
  Filled 2015-11-05: qty 2

## 2015-11-05 MED ORDER — ACETAMINOPHEN 325 MG PO TABS: 650.0000 mg | ORAL_TABLET | Freq: Four times a day (QID) | ORAL | Status: DC | PRN

## 2015-11-05 MED ORDER — ONDANSETRON HCL 4 MG/2ML IJ SOLN: 4.0000 mg | Freq: Four times a day (QID) | INTRAMUSCULAR | Status: DC | PRN

## 2015-11-05 MED ORDER — SODIUM CHLORIDE 0.9 % IV BOLUS (SEPSIS)
250.0000 mL | Freq: Once | INTRAVENOUS | Status: AC
  Administered 2015-11-05: 08:00:00 via INTRAVENOUS

## 2015-11-05 MED ORDER — ACETAMINOPHEN 650 MG RE SUPP: 650.0000 mg | Freq: Four times a day (QID) | RECTAL | Status: DC | PRN

## 2015-11-05 MED ORDER — LEVOTHYROXINE SODIUM 100 MCG IV SOLR
37.5000 ug | Freq: Every day | INTRAVENOUS | Status: DC
  Administered 2015-11-06: 37.5 ug via INTRAVENOUS
  Filled 2015-11-05 (×2): qty 5

## 2015-11-05 MED ORDER — SODIUM CHLORIDE 0.9 % IV SOLN: INTRAVENOUS | Status: DC

## 2015-11-05 MED ORDER — METOPROLOL TARTRATE 5 MG/5ML IV SOLN
5.0000 mg | Freq: Four times a day (QID) | INTRAVENOUS | Status: DC
  Administered 2015-11-05 – 2015-11-06 (×3): 5 mg via INTRAVENOUS
  Filled 2015-11-05 (×4): qty 5

## 2015-11-05 MED ORDER — IOPAMIDOL (ISOVUE-300) INJECTION 61%
INTRAVENOUS | Status: AC
  Administered 2015-11-05: 30 mL
  Filled 2015-11-05: qty 30

## 2015-11-05 MED ORDER — FENTANYL CITRATE (PF) 100 MCG/2ML IJ SOLN
25.0000 ug | Freq: Once | INTRAMUSCULAR | Status: AC
  Administered 2015-11-05: 25 ug via INTRAVENOUS
  Filled 2015-11-05: qty 2

## 2015-11-05 MED ORDER — ONDANSETRON HCL 4 MG PO TABS: 4.0000 mg | ORAL_TABLET | Freq: Four times a day (QID) | ORAL | Status: DC | PRN

## 2015-11-05 MED ORDER — FAMOTIDINE IN NACL 20-0.9 MG/50ML-% IV SOLN
20.0000 mg | INTRAVENOUS | Status: DC
  Administered 2015-11-05 – 2015-11-06 (×2): 20 mg via INTRAVENOUS
  Filled 2015-11-05 (×2): qty 50

## 2015-11-05 NOTE — ED Triage Notes (Signed)
PT c/o generalized abdominal pain with n/v. PT last chemo tx was 10/26/15. PT seen and tx by Dr. Whitney Muse for liver and colon cancer. PT has ileostomy and states "I think I may have a blockage again."

## 2015-11-05 NOTE — Discharge Summary (Deleted)
History and Physical    Karen [name] [id number] DOB: 26-Jan-1930 DOA: 11/05/2015  PCP: [name], MD  Patient coming from: home  Chief Complaint: abdominal pain  HPI: Karen [name] is a 82 y.o. female with medical history significant of metastatic colon cancer, status post ileostomy status, presents to the hospital with complaints of abdominal pain. Patient reports her symptoms began earlier this morning. Yesterday she was in her usual state of health. This morning, she had significant abdominal pain across her entire abdomen. This is associated with abdominal distention. She had associated nausea and had vomiting prior to admission. She reports that her ostomy was putting out stool yesterday, but has not had any further stool today. She denies any fever. She did have some shortness of breath as well as her abdominal distention got worse. Did not have any chest pain.  ED Course: She was evaluated in the emergency room where abdominal imaging indicated small bowel obstruction. NG tube was placed in the emergency room which helped improve her abdominal pain. General surgery has seen her in the emergency room has recommended admission for further medical management.  Review of Systems: As per HPI otherwise 10 point review of systems negative.    Past Medical History:  Diagnosis Date  . Abdominal adhesions   . Achalasia    a. s/p botox injection for achalasia.  . Anemia   . Arthritis   . Asthma    hx of  . Blood transfusion   . Breast lump   . Cancer ([place])    bladder  . Chest pain    a. 2002 Cath: nl cors;  b. 2009 aden mv: nl;  c. 05/2009 Echo: nl. d. 2014: normal nuclear stress test, EF 69%.  . Chronic diastolic CHF (congestive heart failure) (HCC)    a. nl EF by echo 05/2009.  . CKD (chronic kidney disease), stage III    pt. states decreased kidney function  . Colon cancer ([place])    a. recurrence 2016 with liver mets -  percutaneous liver biopsy and thermal ablation of a  liver metastasis by interventional radiology..  . Colon polyp   . Depression   . Diabetes mellitus without complication ([place]) 123456   borderline type II; takes no medication for it  . DVT of deep femoral vein ([place])   . Family history of bladder cancer   . Family history of breast cancer   . Family history of colon cancer   . Fibromyalgia   . GERD (gastroesophageal reflux disease)   . Headache   . Hearing loss   . Hernia   . History of blood clots   . History of PSVT (paroxysmal supraventricular tachycardia)   . Hyperlipidemia   . Hypertension   . Ileostomy present ([place])   . Iron deficiency anemia due to chronic blood loss 07/06/2013  . LBBB (left bundle branch block)   . Obstruction of intestine or colon ([place]) 06/2014  . Sinus bradycardia    a. HR 30s in 08/2014 requiring holding of digoxin.  Mar[place] Kitchen Thyroid disease   . UTI (urinary tract infection)   . Vision abnormalities 2016   not going blind but having retina problems; might lose ability to see faces  . Wears dentures     Past Surgical History:  Procedure Laterality Date  . ABDOMINAL HYSTERECTOMY    . APPENDECTOMY    . BOTOX INJECTION N/A 09/01/2013   Procedure: BOTOX INJECTION;  Surgeon: Rogene Houston, MD;  Location: [facility] ENDO  SUITE;  Service: Endoscopy;  Laterality: N/A;  . BREAST SURGERY     right breast biopsy  . CARDIAC CATHETERIZATION  12/10/2000   THE LEFT VENTRICLE IS MILDY DILATED. THERE IS MILD TO MODERATE DIFFUSE HYPOKINESIS WITH EF 35%  . CATARACT EXTRACTION W/PHACO  11/13/2011   Procedure: CATARACT EXTRACTION PHACO AND INTRAOCULAR LENS PLACEMENT (IOC);  Surgeon: Tonny Branch, MD;  Location: [facility] ORS;  Service: Ophthalmology;  Laterality: Right;  CDE 12.26  . CATARACT EXTRACTION W/PHACO  12/08/2011   Procedure: CATARACT EXTRACTION PHACO AND INTRAOCULAR LENS PLACEMENT (IOC);  Surgeon: Tonny Branch, MD;  Location: [facility] ORS;  Service: Ophthalmology;  Laterality: Left;  CDE 15.11  . CHOLECYSTECTOMY    . COLON CANCER SURGERY     . COLON SURGERY    . COLONOSCOPY  09/26/2011   Procedure: COLONOSCOPY;  Surgeon: Rogene Houston, MD;  Location: [facility] ENDO SUITE;  Service: Endoscopy;  Laterality: N/A;  215  . CORONARY ANGIOPLASTY    . ESOPHAGOGASTRODUODENOSCOPY  02/13/2011   Procedure: ESOPHAGOGASTRODUODENOSCOPY (EGD);  Surgeon: Rogene Houston, MD;  Location: [facility] ENDO SUITE;  Service: Endoscopy;  Laterality: N/A;  1030  . ESOPHAGOGASTRODUODENOSCOPY N/A 09/01/2013   Procedure: ESOPHAGOGASTRODUODENOSCOPY (EGD);  Surgeon: Rogene Houston, MD;  Location: [facility] ENDO SUITE;  Service: Endoscopy;  Laterality: N/A;  830  . EYE SURGERY  2014   catarac surgery  . ILEOSTOMY    . ILEOSTOMY    . JOINT REPLACEMENT Left 2008  . liver biopsy and ablation  09/01/14  . ovarian cystectomy 1955    . ROTATOR CUFF REPAIR    . TONSILLECTOMY       reports that she quit smoking about 52 years ago. Her smoking use included Cigarettes. She has a 3.25 pack-year smoking history. She has never used smokeless tobacco. She reports that she does not drink alcohol or use drugs.  Allergies  Allergen Reactions  . Bactrim [Sulfamethoxazole-Trimethoprim] Shortness Of Breath and Other (See Comments)    Hurting all over  . Codeine Palpitations and Other (See Comments)    Heart races  . Demerol Palpitations and Other (See Comments)    tachycardia  . Lidocaine Hcl Other (See Comments)    tachycardia  . Morphine And Related Palpitations    Tachycardia   . Dilaudid [Hydromorphone Hcl] Other (See Comments)    Hallucinations  . Nitrofurantoin Monohyd Macro Other (See Comments)    Unknown   . Lisinopril Cough  . Ramipril Cough    Family History  Problem Relation Age of Onset  . Heart disease Mother   . Stroke Mother     deceased  . Arthritis Mother   . Bladder Cancer Mother     dx in her 61s  . Colon cancer Mother   . Arthritis Father   . Heart disease Father     decesaed  . Leukemia Father   . Stroke Sister     alive/debilitated  .  Hypertension Sister   . Other Sister     paralysis  . Heart disease Brother     bypass surgery  . Arthritis Brother   . Colon cancer Brother   . Bladder Cancer Brother   . Stroke Sister     alive/debilitated  . Diabetes Sister   . Other Brother     stomach problems  . Other Brother     bladder  . Colon cancer Paternal Aunt   . Bladder Cancer Other     dx twice in her 30s-40s  .  Breast cancer Other     great niece dx in her early 70s  . Prostate cancer Other     newphew  . Colon polyps Daughter   . Stroke    . Hypertension       Prior to Admission medications   Medication Sig Start Date End Date Taking? Authorizing Provider  ALPRAZolam Duanne Moron) 0.5 MG tablet Take 0.5 mg by mouth at bedtime.    Yes Historical Provider, MD  aspirin EC 325 MG tablet Take 325 mg by mouth daily.   Yes Historical Provider, MD  Calcium Carbonate (CALTRATE 600 PO) Take 1 tablet by mouth daily.    Yes Historical Provider, MD  carvedilol (COREG) 12.5 MG tablet Take 1.5 tablets (18.75 mg total) by mouth 2 (two) times daily with a meal. 10/25/15  Yes Peter M Martinique, MD  HYDROcodone-acetaminophen (NORCO) 7.5-325 MG tablet Take 1 tablet by mouth every 4 (four) hours as needed. Patient taking differently: Take 1 tablet by mouth every 4 (four) hours as needed for moderate pain.  10/01/15  Yes [name] Kefalas, PA-C  isosorbide mononitrate (IMDUR) 30 MG 24 hr tablet Take 15 mg by mouth daily. 10/31/15  Yes Historical Provider, MD  levothyroxine (SYNTHROID, LEVOTHROID) 75 MCG tablet Take 75 mcg by mouth daily.    Yes Historical Provider, MD  magnesium oxide (MAG-OX) 400 (241.3 Mg) MG tablet Take 1 tablet (400 mg total) by mouth 4 (four) times daily. 08/31/15  Yes Baird Cancer, PA-C  Multiple Vitamins-Minerals (CENTRUM WOMEN PO) Take by mouth daily.   Yes Historical Provider, MD  Omega-3 Fatty Acids (FISH OIL) 1000 MG CAPS Take 1 capsule by mouth daily. Reported on 02/19/2015   Yes Historical Provider, MD    pantoprazole (PROTONIX) 40 MG tablet Take 40 mg by mouth daily.    Yes Historical Provider, MD  diphenoxylate-atropine (LOMOTIL) 2.5-0.025 MG tablet Take 1-2 tablets four times a day as needed for loose stools. 08/31/15   Baird Cancer, PA-C  fentaNYL (DURAGESIC - DOSED MCG/HR) 25 MCG/HR patch Place 1 patch (25 mcg total) onto the skin every 3 (three) days. 10/01/15   Baird Cancer, PA-C  hyoscyamine (LEVSIN SL) 0.125 MG SL tablet Place 1 tablet (0.125 mg total) under the tongue 3 (three) times daily as needed (lower abdominal pain). 09/18/15   Rogene Houston, MD  metoprolol tartrate (LOPRESSOR) 25 MG tablet Take 1 tablet (25 mg total) by mouth as needed. For palpitations 09/11/15 12/10/15  Rhonda G Barrett, PA-C  NITROSTAT 0.4 MG SL tablet DISSOLVE ONE TABLET UNDER THE TONGUE EVERY 5 MINUTES AS NEEDED FOR CHEST PAIN.  DO NOT EXCEED A TOTAL OF 3 DOSES IN 15 MINUTES 11/15/13   Darlin Coco, MD  nystatin-triamcinolone [facility] II) cream Apply 1 application topically 2 (two) times daily. 03/20/15   Rogene Houston, MD  ondansetron (ZOFRAN ODT) 8 MG disintegrating tablet Take 1 tablet (8 mg total) by mouth every 8 (eight) hours as needed for nausea or vomiting. 05/22/15   Baird Cancer, PA-C  Panitumumab (VECTIBIX IV) Inject into the vein. To start 04/20/15. To be given every 2 weeks.    Historical Provider, MD  prochlorperazine (COMPAZINE) 10 MG tablet Take 1 tablet (10 mg total) by mouth every 6 (six) hours as needed for nausea or vomiting. 05/22/15   Baird Cancer, PA-C    Physical Exam: Vitals:   11/05/15 1300 11/05/15 1330 11/05/15 1400 11/05/15 1503  BP: 154/65 153/58 (!) 137/54 (!) 146/46  Pulse:  62 66 64 66  Resp:    16  Temp:    98.9 F (37.2 C)  TempSrc:    Oral  SpO2: 97% 95% 92% 93%  Weight:    64.9 kg (143 lb)  Height:    5' (1.524 m)      Constitutional: NAD, calm, comfortable Vitals:   11/05/15 1300 11/05/15 1330 11/05/15 1400 11/05/15 1503  BP: 154/65 153/58 (!)  137/54 (!) 146/46  Pulse: 62 66 64 66  Resp:    16  Temp:    98.9 F (37.2 C)  TempSrc:    Oral  SpO2: 97% 95% 92% 93%  Weight:    64.9 kg (143 lb)  Height:    5' (1.524 m)   Eyes: PERRL, lids and conjunctivae normal ENMT: Mucous membranes are moist. Posterior pharynx clear of any exudate or lesions.Normal dentition.  Neck: normal, supple, no masses, no thyromegaly Respiratory: clear to auscultation bilaterally, no wheezing, no crackles. Normal respiratory effort. No accessory muscle use.  Cardiovascular: Regular rate and rhythm, no murmurs / rubs / gallops. No extremity edema. 2+ pedal pulses. No carotid bruits.  Abdomen: diffuse abdominal tenderness, no masses palpated. No hepatosplenomegaly. Bowel sounds positive. Ostomy in RLQ with small amount of dark liquid stool Musculoskeletal: no clubbing / cyanosis. No joint deformity upper and lower extremities. Good ROM, no contractures. Normal muscle tone.  Skin: no rashes, lesions, ulcers. No induration Neurologic: CN 2-12 grossly intact. Sensation intact, DTR normal. Strength 5/5 in all 4.  Psychiatric: Normal judgment and insight. Alert and oriented x 3. Normal mood.    Labs on Admission: I have personally reviewed following labs and imaging studies  CBC:  Recent Labs Lab 11/05/15 0722  WBC 5.1  NEUTROABS 3.6  HGB 10.9*  HCT 33.4*  MCV 91.8  PLT [id number]   Basic Metabolic Panel:  Recent Labs Lab 11/05/15 0804  NA 139  K 3.8  CL 105  CO2 26  GLUCOSE 121*  BUN 18  CREATININE 0.82  CALCIUM 8.7*   GFR: Estimated Creatinine Clearance: 43 mL/min (by C-G formula based on SCr of 0.82 mg/dL). Liver Function Tests:  Recent Labs Lab 11/05/15 0804  AST 23  ALT 16  ALKPHOS 59  BILITOT 0.6  PROT 6.8  ALBUMIN 3.8    Recent Labs Lab 11/05/15 0804  LIPASE 18   No results for input(s): AMMONIA in the last 168 hours. Coagulation Profile: No results for input(s): INR, PROTIME in the last 168 hours. Cardiac Enzymes: No  results for input(s): CKTOTAL, CKMB, CKMBINDEX, TROPONINI in the last 168 hours. BNP (last 3 results) No results for input(s): PROBNP in the last 8760 hours. HbA1C: No results for input(s): HGBA1C in the last 72 hours. CBG: No results for input(s): GLUCAP in the last 168 hours. Lipid Profile: No results for input(s): CHOL, HDL, LDLCALC, TRIG, CHOLHDL, LDLDIRECT in the last 72 hours. Thyroid Function Tests: No results for input(s): TSH, T4TOTAL, FREET4, T3FREE, THYROIDAB in the last 72 hours. Anemia Panel: No results for input(s): VITAMINB12, FOLATE, FERRITIN, TIBC, IRON, RETICCTPCT in the last 72 hours. Urine analysis:    Component Value Date/Time   COLORURINE YELLOW 11/05/2015 0910   APPEARANCEUR CLEAR 11/05/2015 0910   LABSPEC 1.015 11/05/2015 0910   PHURINE 6.0 11/05/2015 0910   GLUCOSEU NEGATIVE 11/05/2015 0910   HGBUR NEGATIVE 11/05/2015 0910   BILIRUBINUR NEGATIVE 11/05/2015 0910   KETONESUR NEGATIVE 11/05/2015 0910   PROTEINUR NEGATIVE 11/05/2015 0910   UROBILINOGEN 0.2 06/15/2014 0800  NITRITE NEGATIVE 11/05/2015 0910   LEUKOCYTESUR NEGATIVE 11/05/2015 0910   Sepsis Labs: !!!!!!!!!!!!!!!!!!!!!!!!!!!!!!!!!!!!!!!!!!!! @LABRCNTIP (procalcitonin:4,lacticidven:4) )No results found for this or any previous visit (from the past 240 hour(s)).   Radiological Exams on Admission: Ct Abdomen Pelvis W Contrast  Result Date: 11/05/2015 CLINICAL DATA:  82 year old female with a history of colon cancer with recurrence in 2000 62 with liver metastases treated with thermal ablation, now presenting with generalized abdominal pain, nausea and vomiting, with small obstruction on radiographs from earlier today. Recent chemotherapy on 10/26/2015. EXAM: CT ABDOMEN AND PELVIS WITH CONTRAST TECHNIQUE: Multidetector CT imaging of the abdomen and pelvis was performed using the standard protocol following bolus administration of intravenous contrast. CONTRAST:  54mL ISOVUE-300 IOPAMIDOL (ISOVUE-300)  INJECTION 61%, 120mL ISOVUE-300 IOPAMIDOL (ISOVUE-300) INJECTION 61% COMPARISON:  09/24/2015 CT abdomen/pelvis. FINDINGS: Lower chest: No significant pulmonary nodules or acute consolidative airspace disease. Stable mild cardiomegaly. Trace pericardial effusion/ thickening is not definitely changed. Hepatobiliary: No appreciable change in the liver masses. Lateral segment left liver lobe 3.3 x 2.2 cm hypodense mass (series 2/image 17), previously 3.5 x 2.2 cm, not appreciably changed. Hypodense segment 6 right liver lobe 1.6 x 1.0 cm mass (series 2/ image 23), previously 1.7 x 0.9 cm, not appreciably changed. Separate peripheral segment 6 right liver lobe 1.0 x 0.6 cm mass (series 2/ image 23), previously 1.0 x 0.6 cm, stable. Hypodense 0.7 cm mass at the right liver capsule (series 2/ image 24), previously 0.7 cm, stable. Subcentimeter hypodense 0.6 cm lesion in the medial posterior right liver lobe is stable. No new liver lesions. Cholecystectomy. No biliary ductal dilatation. Pancreas: Normal, with no mass or duct dilation. Spleen: Normal size. No mass. Adrenals/Urinary Tract: No discrete adrenal nodules. No hydronephrosis. Simple 2.8 cm renal cyst in the interpolar right kidney. Simple 1.1 cm renal cyst in the lower left kidney. Subcentimeter hypodense renal cortical lesion in the lateral interpolar left kidney, too small to characterize, stable, probably a benign renal cyst. No additional renal lesions. Poorly visualized bladder due to streak artifact, with no gross bladder abnormality. Stomach/Bowel: Grossly normal stomach. Status post right hemicolectomy with end ileostomy in the ventral right abdominal wall. There is new mild-to-moderate dilatation of proximal to mid small bowel loops up to the 3.7 cm diameter with associated air-fluid levels. Distal small bowel is collapsed. There is an apparent focal small bowel caliber transition in the anterior left paramedian abdomen (series 8/ image 49). No small bowel  wall thickening, discrete mass or definite pneumatosis. The remnant left colon demonstrates mild sigmoid diverticulosis and nonspecific wall thickening at the junction of the descending and sigmoid colon, which appears stable and may represent postsurgical change. No new pericolonic fat stranding or new large bowel wall thickening. Stable prominent diastasis of the right ventral abdominal wall. Vascular/Lymphatic: Atherosclerotic nonaneurysmal abdominal aorta. Patent portal, splenic, hepatic and renal veins. No pathologically enlarged lymph nodes in the abdomen or pelvis. Reproductive: Status post hysterectomy, with no abnormal findings at the vaginal cuff. No adnexal mass. Other: No pneumoperitoneum, ascites or focal fluid collection. Musculoskeletal: No aggressive appearing focal osseous lesions. Partially visualized left total hip arthroplasty. Mild thoracolumbar spondylosis. IMPRESSION: 1. Mechanical small-bowel obstruction at the level of the mid small bowel, probably due to adhesions. No free air. No fluid collections. 2. Liver metastases appear stable. No new or progressive metastatic disease in the abdomen or pelvis. 3. Additional findings include stable mild cardiomegaly, trace pericardial effusion/thickening, aortic atherosclerosis and mild sigmoid diverticulosis. Electronically Signed   By: Ilona Sorrel  M.D.   On: 11/05/2015 12:03   Dg Chest Portable 1 View  Result Date: 11/05/2015 CLINICAL DATA:  Nasogastric tube placement. EXAM: PORTABLE CHEST 1 VIEW COMPARISON:  Radiographs 11/05/2015.  CT 09/24/2015. FINDINGS: Two views are submitted. The tip of the nasogastric tube projects below the diaphragm to the level of the mid stomach. Side hole is near the gastric cardia. There is stable cardiac enlargement. The mediastinal contours are stable. The lungs are clear. There is no pleural effusion or pneumothorax. IMPRESSION: Nasogastric tube extends into the mid stomach. Electronically Signed   By: Richardean Sale M.D.   On: 11/05/2015 13:54   Dg Abd Acute W/chest  Result Date: 11/05/2015 CLINICAL DATA:  Abdominal pain with nausea and vomiting this morning. Stage IV colon cancer. History is small bowel obstruction. EXAM: DG ABDOMEN ACUTE W/ 1V CHEST COMPARISON:  CT scans of the abdomen and chest dated 09/24/2015 and chest x-ray dated 06/12/2015 FINDINGS: There is a dilated loop of small bowel in the left lower quadrant. No free air or free fluid. The lungs are clear. No acute bone abnormality. Moderate arthritis of the right hip. Left total hip prosthesis. Aortic atherosclerosis. IMPRESSION: Findings consistent with focal small-bowel obstruction in the left lower quadrant. Aortic atherosclerosis. Electronically Signed   By: Lorriane Shire M.D.   On: 11/05/2015 08:33   Assessment/Plan Active Problems:   Hypothyroid   Ileostomy status (HCC)   Abdominal pain, acute   Colon cancer metastasized to liver (HCC)   SBO (small bowel obstruction) (HCC)   GERD (gastroesophageal reflux disease)   HTN (hypertension)     1. Small bowel obstruction. Likely related to adhesions. Treat the patient supportively with NG tube decompression, IV fluids, frequent ambulation. We'll continue to replace electrolytes. Patient will be kept nothing by mouth until she starts her bowels.  2. Hypothyroidism. Will change Synthroid to IV  3. History of paroxysmal supraventricular tachycardia. Patient is on beta blockers. Will give IV Lopressor while she is nothing by mouth.  4. Hypertension. Blood pressure currently stable. Continue to follow  5. GERD. Start on IV H2 blockers   6. Metastatic colon cancer. Follow-up with oncology for further management   DVT prophylaxis: lovenox Code Status: full Family Communication: discussed with family at the bedside Disposition Plan: discharge home once improved Consults called: general surgery Admission status: inpatient   [facility] MD [facility] Pager  [phone]  If 7PM-7AM, please contact night-coverage [email or web address] Password [facility]  11/05/2015, 4:42 PM

## 2015-11-05 NOTE — ED Notes (Signed)
Pt drank 1/2 container of contrast and states she can't drink anymore.  States it feels like it is stuck and she can't breathe.  o2 sats WNL.  Dr. Rogene Houston notified.

## 2015-11-05 NOTE — ED Provider Notes (Signed)
[facility] DEPT Provider Note   CSN: [id number] Arrival date & time: 11/05/15  [id number]     History   Chief Complaint Chief Complaint  Patient presents with  . Abdominal Pain    HPI ALINE WELDE is a 82 y.o. female.  A patient with onset of abdominal pain crampy in nature at 5 this morning. Consistent with her prior small bowel obstruction. Patient had admission in June for similar problem which resolved with medical treatment. Patient does have a history of colon cancer with metastatic disease to the liver currently undergoing chemotherapy last dose of chemotherapy was September 15. Patient has an ileostomy she's had her colon removed patient says ileostomy bag has not had any output. Symptoms associated with nausea and vomiting.      Past Medical History:  Diagnosis Date  . Abdominal adhesions   . Achalasia    a. s/p botox injection for achalasia.  . Anemia   . Arthritis   . Asthma    hx of  . Blood transfusion   . Breast lump   . Cancer ([place])    bladder  . Chest pain    a. 2002 Cath: nl cors;  b. 2009 aden mv: nl;  c. 05/2009 Echo: nl. d. 2014: normal nuclear stress test, EF 69%.  . Chronic diastolic CHF (congestive heart failure) (HCC)    a. nl EF by echo 05/2009.  . CKD (chronic kidney disease), stage III    pt. states decreased kidney function  . Colon cancer ([place])    a. recurrence 2016 with liver mets -  percutaneous liver biopsy and thermal ablation of a liver metastasis by interventional radiology..  . Colon polyp   . Depression   . Diabetes mellitus without complication ([place]) [id number]   borderline type II; takes no medication for it  . DVT of deep femoral vein ([place])   . Family history of bladder cancer   . Family history of breast cancer   . Family history of colon cancer   . Fibromyalgia   . GERD (gastroesophageal reflux disease)   . Headache   . Hearing loss   . Hernia   . History of blood clots   . History of PSVT (paroxysmal supraventricular  tachycardia)   . Hyperlipidemia   . Hypertension   . Ileostomy present ([place])   . Iron deficiency anemia due to chronic blood loss 07/06/2013  . LBBB (left bundle branch block)   . Obstruction of intestine or colon ([place]) 06/2014  . Sinus bradycardia    a. HR 30s in 08/2014 requiring holding of digoxin.  Marland Kitchen Thyroid disease   . UTI (urinary tract infection)   . Vision abnormalities 2016   not going blind but having retina problems; might lose ability to see faces  . Wears dentures     Patient Active Problem List   Diagnosis Date Noted  . SBO (small bowel obstruction) ([place]) 11/05/2015  . Hypomagnesemia 07/16/2015  . CKD (chronic kidney disease) stage 3, GFR 30-59 ml/min 06/13/2015  . AKI (acute kidney injury) ([place]) 06/12/2015  . Urinary tract infection 06/12/2015  . Hypotension 06/12/2015  . Diarrhea 06/12/2015  . Elevated troponin 06/12/2015  . Counseling regarding end of life decision making 04/17/2015  . Neoplasm related pain 04/17/2015  . Genetic testing 03/23/2015  . Family history of colon cancer   . Family history of bladder cancer   . Family history of breast cancer   . Sinus bradycardia 09/06/2014  . Liver lesion   .  Colon cancer metastasized to liver ([place]) 09/01/2014  . Liver mass, left lobe   . Acute abdominal pain 06/15/2014  . Acute renal failure ([place]) 06/15/2014  . Hyperglycemia 06/15/2014  . UTI (urinary tract infection) 06/15/2014  . Small bowel obstruction ([place]) 06/15/2014  . Abdominal pain, acute   . Iron deficiency anemia due to chronic blood loss 07/06/2013  . Malabsorption of iron 07/06/2013  . Parastomal hernia 10/19/2012  . Unstable angina ([place]) 05/24/2012  . Ileostomy status ([place]) 06/04/2011  . Paroxysmal SVT (supraventricular tachycardia) ([place]) 05/06/2011  . Dysphagia 12/30/2010  . Small bowel obstruction, partial ([place]) 09/03/2010  . Left bundle branch block 07/24/2010  . Chest pain, exertional 07/24/2010  . Benign hypertensive heart disease  without heart failure 07/24/2010  . Hypothyroid 07/24/2010    Past Surgical History:  Procedure Laterality Date  . ABDOMINAL HYSTERECTOMY    . APPENDECTOMY    . BOTOX INJECTION N/A 09/01/2013   Procedure: BOTOX INJECTION;  Surgeon: Rogene Houston, MD;  Location: [facility] ENDO SUITE;  Service: Endoscopy;  Laterality: N/A;  . BREAST SURGERY     right breast biopsy  . CARDIAC CATHETERIZATION  12/10/2000   THE LEFT VENTRICLE IS MILDY DILATED. THERE IS MILD TO MODERATE DIFFUSE HYPOKINESIS WITH EF 35%  . CATARACT EXTRACTION W/PHACO  11/13/2011   Procedure: CATARACT EXTRACTION PHACO AND INTRAOCULAR LENS PLACEMENT (IOC);  Surgeon: Tonny Branch, MD;  Location: [facility] ORS;  Service: Ophthalmology;  Laterality: Right;  CDE 12.26  . CATARACT EXTRACTION W/PHACO  12/08/2011   Procedure: CATARACT EXTRACTION PHACO AND INTRAOCULAR LENS PLACEMENT (IOC);  Surgeon: Tonny Branch, MD;  Location: [facility] ORS;  Service: Ophthalmology;  Laterality: Left;  CDE 15.11  . CHOLECYSTECTOMY    . COLON CANCER SURGERY    . COLON SURGERY    . COLONOSCOPY  09/26/2011   Procedure: COLONOSCOPY;  Surgeon: Rogene Houston, MD;  Location: [facility];  Service: Endoscopy;  Laterality: N/A;  215  . CORONARY ANGIOPLASTY    . ESOPHAGOGASTRODUODENOSCOPY  02/13/2011   Procedure: ESOPHAGOGASTRODUODENOSCOPY (EGD);  Surgeon: Rogene Houston, MD;  Location: [facility];  Service: Endoscopy;  Laterality: N/A;  1030  . ESOPHAGOGASTRODUODENOSCOPY N/A 09/01/2013   Procedure: ESOPHAGOGASTRODUODENOSCOPY (EGD);  Surgeon: Rogene Houston, MD;  Location: [facility];  Service: Endoscopy;  Laterality: N/A;  830  . EYE SURGERY  2014   catarac surgery  . ILEOSTOMY    . ILEOSTOMY    . JOINT REPLACEMENT Left 2008  . liver biopsy and ablation  09/01/14  . ovarian cystectomy 1955    . ROTATOR CUFF REPAIR    . TONSILLECTOMY      OB History    Gravida Para Term Preterm AB Living             4   SAB TAB Ectopic Multiple Live Births                   Home  Medications    Prior to Admission medications   Medication Sig Start Date End Date Taking? Authorizing Provider  ALPRAZolam Duanne Moron) 0.5 MG tablet Take 0.5 mg by mouth at bedtime.    Yes Historical Provider, MD  aspirin EC 325 MG tablet Take 325 mg by mouth daily.   Yes Historical Provider, MD  Calcium Carbonate (CALTRATE 600 PO) Take 1 tablet by mouth daily.    Yes Historical Provider, MD  carvedilol (COREG) 12.5 MG tablet Take 1.5 tablets (18.75 mg total) by mouth 2 (two) times  daily with a meal. 10/25/15  Yes Peter M Martinique, MD  HYDROcodone-acetaminophen [facility]) 7.5-325 MG tablet Take 1 tablet by mouth every 4 (four) hours as needed. Patient taking differently: Take 1 tablet by mouth every 4 (four) hours as needed for moderate pain.  10/01/15  Yes [name] Kefalas, PA-C  isosorbide mononitrate (IMDUR) 30 MG 24 hr tablet Take 15 mg by mouth daily. 10/31/15  Yes Historical Provider, MD  levothyroxine (SYNTHROID, LEVOTHROID) 75 MCG tablet Take 75 mcg by mouth daily.    Yes Historical Provider, MD  magnesium oxide (MAG-OX) 400 (241.3 Mg) MG tablet Take 1 tablet (400 mg total) by mouth 4 (four) times daily. 08/31/15  Yes Baird Cancer, PA-C  Multiple Vitamins-Minerals (CENTRUM WOMEN PO) Take by mouth daily.   Yes Historical Provider, MD  Omega-3 Fatty Acids (FISH OIL) 1000 MG CAPS Take 1 capsule by mouth daily. Reported on 02/19/2015   Yes Historical Provider, MD  pantoprazole (PROTONIX) 40 MG tablet Take 40 mg by mouth daily.    Yes Historical Provider, MD  diphenoxylate-atropine (LOMOTIL) 2.5-0.025 MG tablet Take 1-2 tablets four times a day as needed for loose stools. 08/31/15   Baird Cancer, PA-C  doxycycline (VIBRA-TABS) 100 MG tablet Take 1 tablet (100 mg total) by mouth 2 (two) times daily. Patient not taking: Reported on 11/05/2015 08/31/15   Baird Cancer, PA-C  fentaNYL (DURAGESIC - DOSED MCG/HR) 25 MCG/HR patch Place 1 patch (25 mcg total) onto the skin every 3 (three) days. 10/01/15    Baird Cancer, PA-C  hyoscyamine (LEVSIN SL) 0.125 MG SL tablet Place 1 tablet (0.125 mg total) under the tongue 3 (three) times daily as needed (lower abdominal pain). 09/18/15   Rogene Houston, MD  metoprolol tartrate (LOPRESSOR) 25 MG tablet Take 1 tablet (25 mg total) by mouth as needed. For palpitations 09/11/15 12/10/15  Rhonda G Barrett, PA-C  NITROSTAT 0.4 MG SL tablet DISSOLVE ONE TABLET UNDER THE TONGUE EVERY 5 MINUTES AS NEEDED FOR CHEST PAIN.  DO NOT EXCEED A TOTAL OF 3 DOSES IN 15 MINUTES 11/15/13   Darlin Coco, MD  nystatin (MYCOSTATIN/NYSTOP) 100000 UNIT/GM POWD Apply 1 Bottle topically 2 (two) times daily. Patient not taking: Reported on 11/05/2015 03/07/15   Patrici Ranks, MD  nystatin-triamcinolone (MYCOLOG II) cream Apply 1 application topically 2 (two) times daily. 03/20/15   Rogene Houston, MD  ondansetron (ZOFRAN ODT) 8 MG disintegrating tablet Take 1 tablet (8 mg total) by mouth every 8 (eight) hours as needed for nausea or vomiting. 05/22/15   Baird Cancer, PA-C  Panitumumab (VECTIBIX IV) Inject into the vein. To start 04/20/15. To be given every 2 weeks.    Historical Provider, MD  prochlorperazine (COMPAZINE) 10 MG tablet Take 1 tablet (10 mg total) by mouth every 6 (six) hours as needed for nausea or vomiting. 05/22/15   Baird Cancer, PA-C    Family History Family History  Problem Relation Age of Onset  . Heart disease Mother   . Stroke Mother     deceased  . Arthritis Mother   . Bladder Cancer Mother     dx in her 78s  . Colon cancer Mother   . Arthritis Father   . Heart disease Father     decesaed  . Leukemia Father   . Stroke Sister     alive/debilitated  . Hypertension Sister   . Other Sister     paralysis  . Heart disease Brother  bypass surgery  . Arthritis Brother   . Colon cancer Brother   . Bladder Cancer Brother   . Stroke Sister     alive/debilitated  . Diabetes Sister   . Other Brother     stomach problems  . Other Brother       bladder  . Colon cancer Paternal Aunt   . Bladder Cancer Other     dx twice in her 30s-40s  . Breast cancer Other     great niece dx in her early 41s  . Prostate cancer Other     newphew  . Colon polyps Daughter   . Stroke    . Hypertension      Social History Social History  Substance Use Topics  . Smoking status: Former Smoker    Packs/day: 0.25    Years: 13.00    Types: Cigarettes    Quit date: 02/11/1963  . Smokeless tobacco: Never Used  . Alcohol use No     Allergies   Bactrim [sulfamethoxazole-trimethoprim]; Codeine; Demerol; Lidocaine hcl; Morphine and related; Dilaudid [hydromorphone hcl]; Nitrofurantoin monohyd macro; Lisinopril; and Ramipril   Review of Systems Review of Systems  Constitutional: Negative for fever.  HENT: Negative for congestion.   Eyes: Negative for visual disturbance.  Respiratory: Negative for shortness of breath.   Cardiovascular: Negative for chest pain.  Gastrointestinal: Positive for abdominal pain, nausea and vomiting.  Genitourinary: Negative for dysuria.  Musculoskeletal: Negative for back pain.  Skin: Negative for rash.  Allergic/Immunologic: Positive for immunocompromised state.  Neurological: Negative for headaches.  Hematological: Does not bruise/bleed easily.  Psychiatric/Behavioral: Negative for confusion.     Physical Exam Updated Vital Signs BP 152/62   Pulse 69   Temp 98.2 F (36.8 C) (Oral)   Resp 18   Ht 5' (1.524 m)   Wt 62.6 kg   SpO2 97%   BMI 26.95 kg/m   Physical Exam  Constitutional: She is oriented to person, place, and time. She appears well-developed and well-nourished. No distress.  HENT:  Head: Normocephalic and atraumatic.  Eyes: EOM are normal. Pupils are equal, round, and reactive to light.  Neck: Normal range of motion. Neck supple.  Cardiovascular: Normal rate, regular rhythm and normal heart sounds.   Pulmonary/Chest: Effort normal and breath sounds normal. No respiratory distress.   Abdominal: Bowel sounds are normal. There is no tenderness.  Neurological: She is alert and oriented to person, place, and time. No cranial nerve deficit. She exhibits normal muscle tone. Coordination normal.  Skin: Skin is warm.  Nursing note and vitals reviewed.    ED Treatments / Results  Labs (all labs ordered are listed, but only abnormal results are displayed) Labs Reviewed  COMPREHENSIVE METABOLIC PANEL - Abnormal; Notable for the following:       Result Value   Glucose, Bld 121 (*)    Calcium 8.7 (*)    All other components within normal limits  CBC WITH DIFFERENTIAL/PLATELET - Abnormal; Notable for the following:    RBC 3.64 (*)    Hemoglobin 10.9 (*)    HCT 33.4 (*)    All other components within normal limits  LIPASE, BLOOD  URINALYSIS, ROUTINE W REFLEX MICROSCOPIC (NOT AT [facility])    EKG  EKG Interpretation None       Radiology Ct Abdomen Pelvis W Contrast  Result Date: 11/05/2015 CLINICAL DATA:  82 year old female with a history of colon cancer with recurrence in 2000 62 with liver metastases treated with thermal ablation, now presenting with  generalized abdominal pain, nausea and vomiting, with small obstruction on radiographs from earlier today. Recent chemotherapy on 10/26/2015. EXAM: CT ABDOMEN AND PELVIS WITH CONTRAST TECHNIQUE: Multidetector CT imaging of the abdomen and pelvis was performed using the standard protocol following bolus administration of intravenous contrast. CONTRAST:  41mL ISOVUE-300 IOPAMIDOL (ISOVUE-300) INJECTION 61%, 167mL ISOVUE-300 IOPAMIDOL (ISOVUE-300) INJECTION 61% COMPARISON:  09/24/2015 CT abdomen/pelvis. FINDINGS: Lower chest: No significant pulmonary nodules or acute consolidative airspace disease. Stable mild cardiomegaly. Trace pericardial effusion/ thickening is not definitely changed. Hepatobiliary: No appreciable change in the liver masses. Lateral segment left liver lobe 3.3 x 2.2 cm hypodense mass (series 2/image 17),  previously 3.5 x 2.2 cm, not appreciably changed. Hypodense segment 6 right liver lobe 1.6 x 1.0 cm mass (series 2/ image 23), previously 1.7 x 0.9 cm, not appreciably changed. Separate peripheral segment 6 right liver lobe 1.0 x 0.6 cm mass (series 2/ image 23), previously 1.0 x 0.6 cm, stable. Hypodense 0.7 cm mass at the right liver capsule (series 2/ image 24), previously 0.7 cm, stable. Subcentimeter hypodense 0.6 cm lesion in the medial posterior right liver lobe is stable. No new liver lesions. Cholecystectomy. No biliary ductal dilatation. Pancreas: Normal, with no mass or duct dilation. Spleen: Normal size. No mass. Adrenals/Urinary Tract: No discrete adrenal nodules. No hydronephrosis. Simple 2.8 cm renal cyst in the interpolar right kidney. Simple 1.1 cm renal cyst in the lower left kidney. Subcentimeter hypodense renal cortical lesion in the lateral interpolar left kidney, too small to characterize, stable, probably a benign renal cyst. No additional renal lesions. Poorly visualized bladder due to streak artifact, with no gross bladder abnormality. Stomach/Bowel: Grossly normal stomach. Status post right hemicolectomy with end ileostomy in the ventral right abdominal wall. There is new mild-to-moderate dilatation of proximal to mid small bowel loops up to the 3.7 cm diameter with associated air-fluid levels. Distal small bowel is collapsed. There is an apparent focal small bowel caliber transition in the anterior left paramedian abdomen (series 8/ image 49). No small bowel wall thickening, discrete mass or definite pneumatosis. The remnant left colon demonstrates mild sigmoid diverticulosis and nonspecific wall thickening at the junction of the descending and sigmoid colon, which appears stable and may represent postsurgical change. No new pericolonic fat stranding or new large bowel wall thickening. Stable prominent diastasis of the right ventral abdominal wall. Vascular/Lymphatic: Atherosclerotic  nonaneurysmal abdominal aorta. Patent portal, splenic, hepatic and renal veins. No pathologically enlarged lymph nodes in the abdomen or pelvis. Reproductive: Status post hysterectomy, with no abnormal findings at the vaginal cuff. No adnexal mass. Other: No pneumoperitoneum, ascites or focal fluid collection. Musculoskeletal: No aggressive appearing focal osseous lesions. Partially visualized left total hip arthroplasty. Mild thoracolumbar spondylosis. IMPRESSION: 1. Mechanical small-bowel obstruction at the level of the mid small bowel, probably due to adhesions. No free air. No fluid collections. 2. Liver metastases appear stable. No new or progressive metastatic disease in the abdomen or pelvis. 3. Additional findings include stable mild cardiomegaly, trace pericardial effusion/thickening, aortic atherosclerosis and mild sigmoid diverticulosis. Electronically Signed   By: Ilona Sorrel M.D.   On: 11/05/2015 12:03   Dg Abd Acute W/chest  Result Date: 11/05/2015 CLINICAL DATA:  Abdominal pain with nausea and vomiting this morning. Stage IV colon cancer. History is small bowel obstruction. EXAM: DG ABDOMEN ACUTE W/ 1V CHEST COMPARISON:  CT scans of the abdomen and chest dated 09/24/2015 and chest x-ray dated 06/12/2015 FINDINGS: There is a dilated loop of small bowel in the left  lower quadrant. No free air or free fluid. The lungs are clear. No acute bone abnormality. Moderate arthritis of the right hip. Left total hip prosthesis. Aortic atherosclerosis. IMPRESSION: Findings consistent with focal small-bowel obstruction in the left lower quadrant. Aortic atherosclerosis. Electronically Signed   By: Lorriane Shire M.D.   On: 11/05/2015 08:33    Procedures Procedures (including critical care time)  Medications Ordered in ED Medications  0.9 %  sodium chloride infusion ( Intravenous Rate/Dose Change 11/05/15 0904)  sodium chloride 0.9 % bolus 250 mL (0 mLs Intravenous Stopped 11/05/15 0904)  ondansetron  (ZOFRAN) injection 4 mg (4 mg Intravenous Given 11/05/15 0734)  fentaNYL (SUBLIMAZE) injection 25 mcg (25 mcg Intravenous Given 11/05/15 0851)  ondansetron (ZOFRAN) injection 4 mg (4 mg Intravenous Given 11/05/15 1053)  iopamidol (ISOVUE-300) 61 % injection (30 mLs  Contrast Given 11/05/15 0932)  iopamidol (ISOVUE-300) 61 % injection 100 mL (100 mLs Intravenous Contrast Given 11/05/15 1114)     Initial Impression / Assessment and Plan / ED Course  I have reviewed the triage vital signs and the nursing notes.  Pertinent labs & imaging results that were available during my care of the patient were reviewed by me and considered in my medical decision making (see chart for details).  Clinical Course    Patient with history of colon cancer with known disease in the liver currently undergoing chemotherapy. Patient with acute onset of abdominal discomfort reminding her of her prior small bowel obstruction in the spring at 5 this morning. Did not resolve so she came in for further evaluation. Associated with nausea and vomiting. Patient's last chemotherapy was September 15. Patient has an ileostomy and states that is not making any fluid which is unusual.  Workup here plain films of the abdomen consistent with small bowel obstruction. CT scan or was nonoperative at that time. However then it came on board so we ordered CT scan. Which also shows evidence small bowel obstruction. Discussed with Dr. Rosana Hoes on call for general surgery he will consult. Patient will be admitted by the hospitalist team. NG tube to be placed.  Final Clinical Impressions(s) / ED Diagnoses   Final diagnoses:  SBO (small bowel obstruction) ([place])    New Prescriptions New Prescriptions   No medications on file     Fredia Sorrow, MD 11/05/15 1304

## 2015-11-05 NOTE — H&P (Signed)
History and Physical    Karen [name] [id number] DOB: 1929-08-29 DOA: 11/05/2015  PCP: [name], MD  Patient coming from: home  Chief Complaint: abdominal pain  HPI: Karen [name] is a 82 y.o. female with medical history significant of metastatic colon cancer, status post ileostomy status, presents to the hospital with complaints of abdominal pain. Patient reports her symptoms began earlier this morning. Yesterday she was in her usual state of health. This morning, she had significant abdominal pain across her entire abdomen. This is associated with abdominal distention. She had associated nausea and had vomiting prior to admission. She reports that her ostomy was putting out stool yesterday, but has not had any further stool today. She denies any fever. She did have some shortness of breath as well as her abdominal distention got worse. Did not have any chest pain.  ED Course: She was evaluated in the emergency room where abdominal imaging indicated small bowel obstruction. NG tube was placed in the emergency room which helped improve her abdominal pain. General surgery has seen her in the emergency room has recommended admission for further medical management.  Review of Systems: As per HPI otherwise 10 point review of systems negative.    Past Medical History:  Diagnosis Date  . Abdominal adhesions   . Achalasia    a. s/p botox injection for achalasia.  . Anemia   . Arthritis   . Asthma    hx of  . Blood transfusion   . Breast lump   . Cancer ([place])    bladder  . Chest pain    a. 2002 Cath: nl cors;  b. 2009 aden mv: nl;  c. 05/2009 Echo: nl. d. 2014: normal nuclear stress test, EF 69%.  . Chronic diastolic CHF (congestive heart failure) (HCC)    a. nl EF by echo 05/2009.  . CKD (chronic kidney disease), stage III    pt. states decreased kidney function  . Colon cancer ([place])    a. recurrence 2016 with liver mets -  percutaneous liver biopsy and thermal ablation of a  liver metastasis by interventional radiology..  . Colon polyp   . Depression   . Diabetes mellitus without complication ([place]) [id number]   borderline type II; takes no medication for it  . DVT of deep femoral vein ([place])   . Family history of bladder cancer   . Family history of breast cancer   . Family history of colon cancer   . Fibromyalgia   . GERD (gastroesophageal reflux disease)   . Headache   . Hearing loss   . Hernia   . History of blood clots   . History of PSVT (paroxysmal supraventricular tachycardia)   . Hyperlipidemia   . Hypertension   . Ileostomy present ([place])   . Iron deficiency anemia due to chronic blood loss 07/06/2013  . LBBB (left bundle branch block)   . Obstruction of intestine or colon ([place]) 06/2014  . Sinus bradycardia    a. HR 30s in 08/2014 requiring holding of digoxin.  Marland Kitchen Thyroid disease   . UTI (urinary tract infection)   . Vision abnormalities 2016   not going blind but having retina problems; might lose ability to see faces  . Wears dentures     Past Surgical History:  Procedure Laterality Date  . ABDOMINAL HYSTERECTOMY    . APPENDECTOMY    . BOTOX INJECTION N/A 09/01/2013   Procedure: BOTOX INJECTION;  Surgeon: Rogene Houston, MD;  Location: [facility];  Service: Endoscopy;  Laterality: N/A;  . BREAST SURGERY     right breast biopsy  . CARDIAC CATHETERIZATION  12/10/2000   THE LEFT VENTRICLE IS MILDY DILATED. THERE IS MILD TO MODERATE DIFFUSE HYPOKINESIS WITH EF 35%  . CATARACT EXTRACTION W/PHACO  11/13/2011   Procedure: CATARACT EXTRACTION PHACO AND INTRAOCULAR LENS PLACEMENT (IOC);  Surgeon: Tonny Branch, MD;  Location: [facility];  Service: Ophthalmology;  Laterality: Right;  CDE 12.26  . CATARACT EXTRACTION W/PHACO  12/08/2011   Procedure: CATARACT EXTRACTION PHACO AND INTRAOCULAR LENS PLACEMENT (IOC);  Surgeon: Tonny Branch, MD;  Location: [facility];  Service: Ophthalmology;  Laterality: Left;  CDE 15.11  . CHOLECYSTECTOMY    . COLON CANCER SURGERY     . COLON SURGERY    . COLONOSCOPY  09/26/2011   Procedure: COLONOSCOPY;  Surgeon: Rogene Houston, MD;  Location: [facility];  Service: Endoscopy;  Laterality: N/A;  215  . CORONARY ANGIOPLASTY    . ESOPHAGOGASTRODUODENOSCOPY  02/13/2011   Procedure: ESOPHAGOGASTRODUODENOSCOPY (EGD);  Surgeon: Rogene Houston, MD;  Location: [facility];  Service: Endoscopy;  Laterality: N/A;  1030  . ESOPHAGOGASTRODUODENOSCOPY N/A 09/01/2013   Procedure: ESOPHAGOGASTRODUODENOSCOPY (EGD);  Surgeon: Rogene Houston, MD;  Location: [facility];  Service: Endoscopy;  Laterality: N/A;  830  . EYE SURGERY  2014   catarac surgery  . ILEOSTOMY    . ILEOSTOMY    . JOINT REPLACEMENT Left 2008  . liver biopsy and ablation  09/01/14  . ovarian cystectomy 1955    . ROTATOR CUFF REPAIR    . TONSILLECTOMY       reports that she quit smoking about 52 years ago. Her smoking use included Cigarettes. She has a 3.25 pack-year smoking history. She has never used smokeless tobacco. She reports that she does not drink alcohol or use drugs.  Allergies  Allergen Reactions  . Bactrim [Sulfamethoxazole-Trimethoprim] Shortness Of Breath and Other (See Comments)    Hurting all over  . Codeine Palpitations and Other (See Comments)    Heart races  . Demerol Palpitations and Other (See Comments)    tachycardia  . Lidocaine Hcl Other (See Comments)    tachycardia  . Morphine And Related Palpitations    Tachycardia   . Dilaudid [Hydromorphone Hcl] Other (See Comments)    Hallucinations  . Nitrofurantoin Monohyd Macro Other (See Comments)    Unknown   . Lisinopril Cough  . Ramipril Cough    Family History  Problem Relation Age of Onset  . Heart disease Mother   . Stroke Mother     deceased  . Arthritis Mother   . Bladder Cancer Mother     dx in her 59s  . Colon cancer Mother   . Arthritis Father   . Heart disease Father     decesaed  . Leukemia Father   . Stroke Sister     alive/debilitated  .  Hypertension Sister   . Other Sister     paralysis  . Heart disease Brother     bypass surgery  . Arthritis Brother   . Colon cancer Brother   . Bladder Cancer Brother   . Stroke Sister     alive/debilitated  . Diabetes Sister   . Other Brother     stomach problems  . Other Brother     bladder  . Colon cancer Paternal Aunt   . Bladder Cancer Other     dx twice in her 30s-40s  .  Breast cancer Other     great niece dx in her early 21s  . Prostate cancer Other     newphew  . Colon polyps Daughter   . Stroke    . Hypertension       Prior to Admission medications   Medication Sig Start Date End Date Taking? Authorizing Provider  ALPRAZolam Duanne Moron) 0.5 MG tablet Take 0.5 mg by mouth at bedtime.    Yes Historical Provider, MD  aspirin EC 325 MG tablet Take 325 mg by mouth daily.   Yes Historical Provider, MD  Calcium Carbonate (CALTRATE 600 PO) Take 1 tablet by mouth daily.    Yes Historical Provider, MD  carvedilol (COREG) 12.5 MG tablet Take 1.5 tablets (18.75 mg total) by mouth 2 (two) times daily with a meal. 10/25/15  Yes Peter M Martinique, MD  HYDROcodone-acetaminophen (NORCO) 7.5-325 MG tablet Take 1 tablet by mouth every 4 (four) hours as needed. Patient taking differently: Take 1 tablet by mouth every 4 (four) hours as needed for moderate pain.  10/01/15  Yes [name] Kefalas, PA-C  isosorbide mononitrate (IMDUR) 30 MG 24 hr tablet Take 15 mg by mouth daily. 10/31/15  Yes Historical Provider, MD  levothyroxine (SYNTHROID, LEVOTHROID) 75 MCG tablet Take 75 mcg by mouth daily.    Yes Historical Provider, MD  magnesium oxide (MAG-OX) 400 (241.3 Mg) MG tablet Take 1 tablet (400 mg total) by mouth 4 (four) times daily. 08/31/15  Yes Baird Cancer, PA-C  Multiple Vitamins-Minerals (CENTRUM WOMEN PO) Take by mouth daily.   Yes Historical Provider, MD  Omega-3 Fatty Acids (FISH OIL) 1000 MG CAPS Take 1 capsule by mouth daily. Reported on 02/19/2015   Yes Historical Provider, MD    pantoprazole (PROTONIX) 40 MG tablet Take 40 mg by mouth daily.    Yes Historical Provider, MD  diphenoxylate-atropine (LOMOTIL) 2.5-0.025 MG tablet Take 1-2 tablets four times a day as needed for loose stools. 08/31/15   Baird Cancer, PA-C  fentaNYL (DURAGESIC - DOSED MCG/HR) 25 MCG/HR patch Place 1 patch (25 mcg total) onto the skin every 3 (three) days. 10/01/15   Baird Cancer, PA-C  hyoscyamine (LEVSIN SL) 0.125 MG SL tablet Place 1 tablet (0.125 mg total) under the tongue 3 (three) times daily as needed (lower abdominal pain). 09/18/15   Rogene Houston, MD  metoprolol tartrate (LOPRESSOR) 25 MG tablet Take 1 tablet (25 mg total) by mouth as needed. For palpitations 09/11/15 12/10/15  Rhonda G Barrett, PA-C  NITROSTAT 0.4 MG SL tablet DISSOLVE ONE TABLET UNDER THE TONGUE EVERY 5 MINUTES AS NEEDED FOR CHEST PAIN.  DO NOT EXCEED A TOTAL OF 3 DOSES IN 15 MINUTES 11/15/13   Darlin Coco, MD  nystatin-triamcinolone [facility] II) cream Apply 1 application topically 2 (two) times daily. 03/20/15   Rogene Houston, MD  ondansetron (ZOFRAN ODT) 8 MG disintegrating tablet Take 1 tablet (8 mg total) by mouth every 8 (eight) hours as needed for nausea or vomiting. 05/22/15   Baird Cancer, PA-C  Panitumumab (VECTIBIX IV) Inject into the vein. To start 04/20/15. To be given every 2 weeks.    Historical Provider, MD  prochlorperazine (COMPAZINE) 10 MG tablet Take 1 tablet (10 mg total) by mouth every 6 (six) hours as needed for nausea or vomiting. 05/22/15   Baird Cancer, PA-C    Physical Exam: Vitals:   11/05/15 1300 11/05/15 1330 11/05/15 1400 11/05/15 1503  BP: 154/65 153/58 (!) 137/54 (!) 146/46  Pulse:  62 66 64 66  Resp:    16  Temp:    98.9 F (37.2 C)  TempSrc:    Oral  SpO2: 97% 95% 92% 93%  Weight:    64.9 kg (143 lb)  Height:    5' (1.524 m)      Constitutional: NAD, calm, comfortable Vitals:   11/05/15 1300 11/05/15 1330 11/05/15 1400 11/05/15 1503  BP: 154/65 153/58 (!)  137/54 (!) 146/46  Pulse: 62 66 64 66  Resp:    16  Temp:    98.9 F (37.2 C)  TempSrc:    Oral  SpO2: 97% 95% 92% 93%  Weight:    64.9 kg (143 lb)  Height:    5' (1.524 m)   Eyes: PERRL, lids and conjunctivae normal ENMT: Mucous membranes are moist. Posterior pharynx clear of any exudate or lesions.Normal dentition.  Neck: normal, supple, no masses, no thyromegaly Respiratory: clear to auscultation bilaterally, no wheezing, no crackles. Normal respiratory effort. No accessory muscle use.  Cardiovascular: Regular rate and rhythm, no murmurs / rubs / gallops. No extremity edema. 2+ pedal pulses. No carotid bruits.  Abdomen: diffuse abdominal tenderness, no masses palpated. No hepatosplenomegaly. Bowel sounds positive. Ostomy in RLQ with small amount of dark liquid stool Musculoskeletal: no clubbing / cyanosis. No joint deformity upper and lower extremities. Good ROM, no contractures. Normal muscle tone.  Skin: no rashes, lesions, ulcers. No induration Neurologic: CN 2-12 grossly intact. Sensation intact, DTR normal. Strength 5/5 in all 4.  Psychiatric: Normal judgment and insight. Alert and oriented x 3. Normal mood.    Labs on Admission: I have personally reviewed following labs and imaging studies  CBC:  Recent Labs Lab 11/05/15 0722  WBC 5.1  NEUTROABS 3.6  HGB 10.9*  HCT 33.4*  MCV 91.8  PLT [id number]   Basic Metabolic Panel:  Recent Labs Lab 11/05/15 0804  NA 139  K 3.8  CL 105  CO2 26  GLUCOSE 121*  BUN 18  CREATININE 0.82  CALCIUM 8.7*   GFR: Estimated Creatinine Clearance: 43 mL/min (by C-G formula based on SCr of 0.82 mg/dL). Liver Function Tests:  Recent Labs Lab 11/05/15 0804  AST 23  ALT 16  ALKPHOS 59  BILITOT 0.6  PROT 6.8  ALBUMIN 3.8    Recent Labs Lab 11/05/15 0804  LIPASE 18   No results for input(s): AMMONIA in the last 168 hours. Coagulation Profile: No results for input(s): INR, PROTIME in the last 168 hours. Cardiac Enzymes: No  results for input(s): CKTOTAL, CKMB, CKMBINDEX, TROPONINI in the last 168 hours. BNP (last 3 results) No results for input(s): PROBNP in the last 8760 hours. HbA1C: No results for input(s): HGBA1C in the last 72 hours. CBG: No results for input(s): GLUCAP in the last 168 hours. Lipid Profile: No results for input(s): CHOL, HDL, LDLCALC, TRIG, CHOLHDL, LDLDIRECT in the last 72 hours. Thyroid Function Tests:  Recent Labs  11/05/15 0804  TSH 5.209*   Anemia Panel: No results for input(s): VITAMINB12, FOLATE, FERRITIN, TIBC, IRON, RETICCTPCT in the last 72 hours. Urine analysis:    Component Value Date/Time   COLORURINE YELLOW 11/05/2015 0910   APPEARANCEUR CLEAR 11/05/2015 0910   LABSPEC 1.015 11/05/2015 0910   PHURINE 6.0 11/05/2015 0910   GLUCOSEU NEGATIVE 11/05/2015 0910   HGBUR NEGATIVE 11/05/2015 0910   BILIRUBINUR NEGATIVE 11/05/2015 0910   KETONESUR NEGATIVE 11/05/2015 0910   PROTEINUR NEGATIVE 11/05/2015 0910   UROBILINOGEN 0.2 06/15/2014 0800   NITRITE NEGATIVE 11/05/2015  [place] 11/05/2015 0910   Sepsis Labs: !!!!!!!!!!!!!!!!!!!!!!!!!!!!!!!!!!!!!!!!!!!! @LABRCNTIP (procalcitonin:4,lacticidven:4) )No results found for this or any previous visit (from the past 240 hour(s)).   Radiological Exams on Admission: Ct Abdomen Pelvis W Contrast  Result Date: 11/05/2015 CLINICAL DATA:  82 year old female with a history of colon cancer with recurrence in 2000 62 with liver metastases treated with thermal ablation, now presenting with generalized abdominal pain, nausea and vomiting, with small obstruction on radiographs from earlier today. Recent chemotherapy on 10/26/2015. EXAM: CT ABDOMEN AND PELVIS WITH CONTRAST TECHNIQUE: Multidetector CT imaging of the abdomen and pelvis was performed using the standard protocol following bolus administration of intravenous contrast. CONTRAST:  51mL ISOVUE-300 IOPAMIDOL (ISOVUE-300) INJECTION 61%, 134mL ISOVUE-300 IOPAMIDOL  (ISOVUE-300) INJECTION 61% COMPARISON:  09/24/2015 CT abdomen/pelvis. FINDINGS: Lower chest: No significant pulmonary nodules or acute consolidative airspace disease. Stable mild cardiomegaly. Trace pericardial effusion/ thickening is not definitely changed. Hepatobiliary: No appreciable change in the liver masses. Lateral segment left liver lobe 3.3 x 2.2 cm hypodense mass (series 2/image 17), previously 3.5 x 2.2 cm, not appreciably changed. Hypodense segment 6 right liver lobe 1.6 x 1.0 cm mass (series 2/ image 23), previously 1.7 x 0.9 cm, not appreciably changed. Separate peripheral segment 6 right liver lobe 1.0 x 0.6 cm mass (series 2/ image 23), previously 1.0 x 0.6 cm, stable. Hypodense 0.7 cm mass at the right liver capsule (series 2/ image 24), previously 0.7 cm, stable. Subcentimeter hypodense 0.6 cm lesion in the medial posterior right liver lobe is stable. No new liver lesions. Cholecystectomy. No biliary ductal dilatation. Pancreas: Normal, with no mass or duct dilation. Spleen: Normal size. No mass. Adrenals/Urinary Tract: No discrete adrenal nodules. No hydronephrosis. Simple 2.8 cm renal cyst in the interpolar right kidney. Simple 1.1 cm renal cyst in the lower left kidney. Subcentimeter hypodense renal cortical lesion in the lateral interpolar left kidney, too small to characterize, stable, probably a benign renal cyst. No additional renal lesions. Poorly visualized bladder due to streak artifact, with no gross bladder abnormality. Stomach/Bowel: Grossly normal stomach. Status post right hemicolectomy with end ileostomy in the ventral right abdominal wall. There is new mild-to-moderate dilatation of proximal to mid small bowel loops up to the 3.7 cm diameter with associated air-fluid levels. Distal small bowel is collapsed. There is an apparent focal small bowel caliber transition in the anterior left paramedian abdomen (series 8/ image 49). No small bowel wall thickening, discrete mass or  definite pneumatosis. The remnant left colon demonstrates mild sigmoid diverticulosis and nonspecific wall thickening at the junction of the descending and sigmoid colon, which appears stable and may represent postsurgical change. No new pericolonic fat stranding or new large bowel wall thickening. Stable prominent diastasis of the right ventral abdominal wall. Vascular/Lymphatic: Atherosclerotic nonaneurysmal abdominal aorta. Patent portal, splenic, hepatic and renal veins. No pathologically enlarged lymph nodes in the abdomen or pelvis. Reproductive: Status post hysterectomy, with no abnormal findings at the vaginal cuff. No adnexal mass. Other: No pneumoperitoneum, ascites or focal fluid collection. Musculoskeletal: No aggressive appearing focal osseous lesions. Partially visualized left total hip arthroplasty. Mild thoracolumbar spondylosis. IMPRESSION: 1. Mechanical small-bowel obstruction at the level of the mid small bowel, probably due to adhesions. No free air. No fluid collections. 2. Liver metastases appear stable. No new or progressive metastatic disease in the abdomen or pelvis. 3. Additional findings include stable mild cardiomegaly, trace pericardial effusion/thickening, aortic atherosclerosis and mild sigmoid diverticulosis. Electronically Signed   By: Janina Mayo.D.  On: 11/05/2015 12:03   Dg Chest Portable 1 View  Result Date: 11/05/2015 CLINICAL DATA:  Nasogastric tube placement. EXAM: PORTABLE CHEST 1 VIEW COMPARISON:  Radiographs 11/05/2015.  CT 09/24/2015. FINDINGS: Two views are submitted. The tip of the nasogastric tube projects below the diaphragm to the level of the mid stomach. Side hole is near the gastric cardia. There is stable cardiac enlargement. The mediastinal contours are stable. The lungs are clear. There is no pleural effusion or pneumothorax. IMPRESSION: Nasogastric tube extends into the mid stomach. Electronically Signed   By: Richardean Sale M.D.   On: 11/05/2015 13:54    Dg Abd Acute W/chest  Result Date: 11/05/2015 CLINICAL DATA:  Abdominal pain with nausea and vomiting this morning. Stage IV colon cancer. History is small bowel obstruction. EXAM: DG ABDOMEN ACUTE W/ 1V CHEST COMPARISON:  CT scans of the abdomen and chest dated 09/24/2015 and chest x-ray dated 06/12/2015 FINDINGS: There is a dilated loop of small bowel in the left lower quadrant. No free air or free fluid. The lungs are clear. No acute bone abnormality. Moderate arthritis of the right hip. Left total hip prosthesis. Aortic atherosclerosis. IMPRESSION: Findings consistent with focal small-bowel obstruction in the left lower quadrant. Aortic atherosclerosis. Electronically Signed   By: Lorriane Shire M.D.   On: 11/05/2015 08:33   Assessment/Plan Active Problems:   Hypothyroid   Ileostomy status (HCC)   Abdominal pain, acute   Colon cancer metastasized to liver (HCC)   SBO (small bowel obstruction) (HCC)   GERD (gastroesophageal reflux disease)   HTN (hypertension)     1. Small bowel obstruction. Likely related to adhesions. Treat the patient supportively with NG tube decompression, IV fluids, frequent ambulation. We'll continue to replace electrolytes. Patient will be kept nothing by mouth until she starts her bowels.  2. Hypothyroidism. Will change Synthroid to IV  3. History of paroxysmal supraventricular tachycardia. Patient is on beta blockers. Will give IV Lopressor while she is nothing by mouth.  4. Hypertension. Blood pressure currently stable. Continue to follow  5. GERD. Start on IV H2 blockers   6. Metastatic colon cancer. Follow-up with oncology for further management   DVT prophylaxis: lovenox Code Status: full Family Communication: discussed with family at the bedside Disposition Plan: discharge home once improved Consults called: general surgery Admission status: inpatient   [facility] MD [facility] Pager [phone]  If 7PM-7AM, please contact  night-coverage [email or web address] Password [email or web address]  11/05/2015, 7:06 PM

## 2015-11-05 NOTE — Consult Note (Signed)
SURGICAL CONSULTATION NOTE (initial) - cpt: 99255  HISTORY OF PRESENT ILLNESS (HPI):  82 y.o. female presented with acute onset of generalized abdominal pain that she reports began when she awoke this morning and vomited once. She and her family state that she has experienced more frequent diarrhea since she started chemotherapy 15 months ago for recurrent metastatic colon carcinoma than she had previously following her subtotal colectomy and subsequent re-operation for anastomotic leak. She typically manages her diarrhea with Imodium and Lomotil, both of which she took yesterday. She was also many years ago told to avoid raw fruits and vegetables (and fruit/vegetable skins), and yesterday she ate lightly steamed zucchini with its skin still on. She has experienced many prior bowel obstructions, most recently 3 weeks ago, which she was able to manage at home, and the most recent bowel obstruction for which she required admission and management with NG decompression was 06/2014. Patient states that she's previously been told she would not survive another abdominal surgery, and she states she does not at this time wish to even consider abdominal surgery if it becomes necessary. She states she was not able to tolerate drinking more than a few sips of the contrast for her CT today, but she has despite that since started to notice a small amount of thick stool emanating from her ileostomy.  Surgery is consulted by ED physician Dr. Rogene Houston in this context for evaluation and management of SBO.  PAST MEDICAL HISTORY (PMH):  Past Medical History:  Diagnosis Date  . Abdominal adhesions   . Achalasia    a. s/p botox injection for achalasia.  . Anemia   . Arthritis   . Asthma    hx of  . Blood transfusion   . Breast lump   . Cancer ([place])    bladder  . Chest pain    a. 2002 Cath: nl cors;  b. 2009 aden mv: nl;  c. 05/2009 Echo: nl. d. 2014: normal nuclear stress test, EF 69%.  . Chronic diastolic CHF  (congestive heart failure) (HCC)    a. nl EF by echo 05/2009.  . CKD (chronic kidney disease), stage III    pt. states decreased kidney function  . Colon cancer ([place])    a. recurrence 2016 with liver mets -  percutaneous liver biopsy and thermal ablation of a liver metastasis by interventional radiology..  . Colon polyp   . Depression   . Diabetes mellitus without complication ([place]) [id number]   borderline type II; takes no medication for it  . DVT of deep femoral vein ([place])   . Family history of bladder cancer   . Family history of breast cancer   . Family history of colon cancer   . Fibromyalgia   . GERD (gastroesophageal reflux disease)   . Headache   . Hearing loss   . Hernia   . History of blood clots   . History of PSVT (paroxysmal supraventricular tachycardia)   . Hyperlipidemia   . Hypertension   . Ileostomy present ([name])   . Iron deficiency anemia due to chronic blood loss 07/06/2013  . LBBB (left bundle branch block)   . Obstruction of intestine or colon ([place]) 06/2014  . Sinus bradycardia    a. HR 30s in 08/2014 requiring holding of digoxin.  Marland Kitchen Thyroid disease   . UTI (urinary tract infection)   . Vision abnormalities 2016   not going blind but having retina problems; might lose ability to see faces  . Wears dentures  PAST SURGICAL HISTORY ([place]):  Past Surgical History:  Procedure Laterality Date  . ABDOMINAL HYSTERECTOMY    . APPENDECTOMY    . BOTOX INJECTION N/A 09/01/2013   Procedure: BOTOX INJECTION;  Surgeon: Rogene Houston, MD;  Location: [facility];  Service: Endoscopy;  Laterality: N/A;  . BREAST SURGERY     right breast biopsy  . CARDIAC CATHETERIZATION  12/10/2000   THE LEFT VENTRICLE IS MILDY DILATED. THERE IS MILD TO MODERATE DIFFUSE HYPOKINESIS WITH EF 35%  . CATARACT EXTRACTION W/PHACO  11/13/2011   Procedure: CATARACT EXTRACTION PHACO AND INTRAOCULAR LENS PLACEMENT (IOC);  Surgeon: Tonny Branch, MD;  Location: [facility];  Service: Ophthalmology;   Laterality: Right;  CDE 12.26  . CATARACT EXTRACTION W/PHACO  12/08/2011   Procedure: CATARACT EXTRACTION PHACO AND INTRAOCULAR LENS PLACEMENT (IOC);  Surgeon: Tonny Branch, MD;  Location: [facility];  Service: Ophthalmology;  Laterality: Left;  CDE 15.11  . CHOLECYSTECTOMY    . COLON CANCER SURGERY    . COLON SURGERY    . COLONOSCOPY  09/26/2011   Procedure: COLONOSCOPY;  Surgeon: Rogene Houston, MD;  Location: [facility];  Service: Endoscopy;  Laterality: N/A;  215  . CORONARY ANGIOPLASTY    . ESOPHAGOGASTRODUODENOSCOPY  02/13/2011   Procedure: ESOPHAGOGASTRODUODENOSCOPY (EGD);  Surgeon: Rogene Houston, MD;  Location: [facility];  Service: Endoscopy;  Laterality: N/A;  1030  . ESOPHAGOGASTRODUODENOSCOPY N/A 09/01/2013   Procedure: ESOPHAGOGASTRODUODENOSCOPY (EGD);  Surgeon: Rogene Houston, MD;  Location: [facility];  Service: Endoscopy;  Laterality: N/A;  830  . EYE SURGERY  2014   catarac surgery  . ILEOSTOMY    . ILEOSTOMY    . JOINT REPLACEMENT Left 2008  . liver biopsy and ablation  09/01/14  . ovarian cystectomy 1955    . ROTATOR CUFF REPAIR    . TONSILLECTOMY       MEDICATIONS:  Prior to Admission medications   Medication Sig Start Date End Date Taking? Authorizing Provider  ALPRAZolam Duanne Moron) 0.5 MG tablet Take 0.5 mg by mouth at bedtime.     Historical Provider, MD  aspirin EC 325 MG tablet Take 325 mg by mouth daily.    Historical Provider, MD  Calcium Carbonate (CALTRATE 600 PO) Take 1 tablet by mouth daily.     Historical Provider, MD  carvedilol (COREG) 12.5 MG tablet Take 1.5 tablets (18.75 mg total) by mouth 2 (two) times daily with a meal. 10/25/15   Peter M Martinique, MD  diphenoxylate-atropine (LOMOTIL) 2.5-0.025 MG tablet Take 1-2 tablets four times a day as needed for loose stools. 08/31/15   Baird Cancer, PA-C  doxycycline (VIBRA-TABS) 100 MG tablet Take 1 tablet (100 mg total) by mouth 2 (two) times daily. 08/31/15   Baird Cancer, PA-C  fentaNYL (DURAGESIC -  DOSED MCG/HR) 25 MCG/HR patch Place 1 patch (25 mcg total) onto the skin every 3 (three) days. 10/01/15   Baird Cancer, PA-C  HYDROcodone-acetaminophen (NORCO) 7.5-325 MG tablet Take 1 tablet by mouth every 4 (four) hours as needed. 10/01/15   Baird Cancer, PA-C  hyoscyamine (LEVSIN SL) 0.125 MG SL tablet Place 1 tablet (0.125 mg total) under the tongue 3 (three) times daily as needed (lower abdominal pain). 09/18/15   Rogene Houston, MD  levothyroxine (SYNTHROID, LEVOTHROID) 75 MCG tablet Take 75 mcg by mouth daily.     Historical Provider, MD  magnesium oxide (MAG-OX) 400 (241.3 Mg) MG tablet Take 1 tablet (400 mg total)  by mouth 4 (four) times daily. 08/31/15   Baird Cancer, PA-C  metoprolol tartrate (LOPRESSOR) 25 MG tablet Take 1 tablet (25 mg total) by mouth as needed. For palpitations 09/11/15 12/10/15  Evelene Croon Barrett, PA-C  Multiple Vitamins-Minerals (CENTRUM WOMEN PO) Take by mouth daily.    Historical Provider, MD  NITROSTAT 0.4 MG SL tablet DISSOLVE ONE TABLET UNDER THE TONGUE EVERY 5 MINUTES AS NEEDED FOR CHEST PAIN.  DO NOT EXCEED A TOTAL OF 3 DOSES IN 15 MINUTES 11/15/13   Darlin Coco, MD  nystatin (MYCOSTATIN/NYSTOP) 100000 UNIT/GM POWD Apply 1 Bottle topically 2 (two) times daily. 03/07/15   Patrici Ranks, MD  nystatin-triamcinolone (MYCOLOG II) cream Apply 1 application topically 2 (two) times daily. 03/20/15   Rogene Houston, MD  Omega-3 Fatty Acids (FISH OIL) 1000 MG CAPS Take 1 capsule by mouth daily. Reported on 02/19/2015    Historical Provider, MD  ondansetron (ZOFRAN ODT) 8 MG disintegrating tablet Take 1 tablet (8 mg total) by mouth every 8 (eight) hours as needed for nausea or vomiting. 05/22/15   Baird Cancer, PA-C  Panitumumab (VECTIBIX IV) Inject into the vein. To start 04/20/15. To be given every 2 weeks.    Historical Provider, MD  pantoprazole (PROTONIX) 40 MG tablet Take 40 mg by mouth daily.     Historical Provider, MD  Probiotic Product (PROBIOTIC &  ACIDOPHILUS EX ST PO) Take 1 tablet by mouth daily.    Historical Provider, MD  prochlorperazine (COMPAZINE) 10 MG tablet Take 1 tablet (10 mg total) by mouth every 6 (six) hours as needed for nausea or vomiting. 05/22/15   Baird Cancer, PA-C     ALLERGIES:  Allergies  Allergen Reactions  . Bactrim [Sulfamethoxazole-Trimethoprim] Shortness Of Breath and Other (See Comments)    Hurting all over  . Codeine Palpitations and Other (See Comments)    Heart races  . Demerol Palpitations and Other (See Comments)    tachycardia  . Lidocaine Hcl Other (See Comments)    tachycardia  . Morphine And Related Palpitations    Tachycardia   . Dilaudid [Hydromorphone Hcl] Other (See Comments)    Hallucinations  . Nitrofurantoin Monohyd Macro Other (See Comments)    Unknown   . Lisinopril Cough  . Ramipril Cough     SOCIAL HISTORY:  Social History   Social History  . Marital status: Married    Spouse name: N/A  . Number of children: 4  . Years of education: N/A   Occupational History  . Not on file.   Social History Main Topics  . Smoking status: Former Smoker    Packs/day: 0.25    Years: 13.00    Types: Cigarettes    Quit date: 02/11/1963  . Smokeless tobacco: Never Used  . Alcohol use No  . Drug use: No  . Sexual activity: Not on file   Other Topics Concern  . Not on file   Social History Narrative  . No narrative on file    FAMILY HISTORY:  Family History  Problem Relation Age of Onset  . Heart disease Mother   . Stroke Mother     deceased  . Arthritis Mother   . Bladder Cancer Mother     dx in her 34s  . Colon cancer Mother   . Arthritis Father   . Heart disease Father     decesaed  . Leukemia Father   . Stroke Sister     alive/debilitated  . Hypertension  Sister   . Other Sister     paralysis  . Heart disease Brother     bypass surgery  . Arthritis Brother   . Colon cancer Brother   . Bladder Cancer Brother   . Stroke Sister     alive/debilitated  .  Diabetes Sister   . Other Brother     stomach problems  . Other Brother     bladder  . Colon cancer Paternal Aunt   . Bladder Cancer Other     dx twice in her 30s-40s  . Breast cancer Other     great niece dx in her early 66s  . Prostate cancer Other     newphew  . Colon polyps Daughter   . Stroke    . Hypertension      REVIEW OF SYSTEMS:  Constitutional: denies weight loss, fever, chills, or sweats  Eyes: denies any other vision changes, history of eye injury  ENT: denies sore throat, hearing problems  Respiratory: denies shortness of breath, wheezing  Cardiovascular: denies chest pain, palpitations  Gastrointestinal: abdominal pain, N/V, and diarrhea as per HPI Genitourinary: denies burning with urination or urinary frequency Musculoskeletal: denies any other joint pains or cramps  Skin: denies any other rashes or skin discolorations  Neurological: denies any other headache, dizziness, weakness  Psychiatric: denies any other depression, anxiety   All other review of systems were negative   VITAL SIGNS:  Temp:  [98.2 F (36.8 C)] 98.2 F (36.8 C) (09/25 0708) Pulse Rate:  [72] 72 (09/25 0900) Resp:  [20] 20 (09/25 0708) BP: (133-188)/(49-65) 135/49 (09/25 0900) SpO2:  [94 %-98 %] 94 % (09/25 0900) Weight:  [62.6 kg (138 lb)] 62.6 kg (138 lb) (09/25 0712)     Height: 5' (152.4 cm) Weight: 62.6 kg (138 lb) BMI (Calculated): 27   INTAKE/OUTPUT:  This shift: No intake/output data recorded.  Last 2 shifts: @IOLAST2SHIFTS @   PHYSICAL EXAM:  Constitutional:  -- Normal body habitus  -- Awake, alert, and oriented x3  Eyes:  -- Pupils equally round and reactive to light  -- No scleral icterus  Ear, nose, and throat:  -- No jugular venous distension  Pulmonary:  -- No crackles  -- Equal breath sounds bilaterally -- Breathing non-labored at rest Cardiovascular:  -- S1, S2 present  -- No pericardial rubs Gastrointestinal:  -- Abdomen soft, mild upper abdominal  tenderness to palpation, moderately distended, no guarding/rebound  -- No abdominal masses appreciated, pulsatile or otherwise; small amount of firm stool emanating from patient's pink ileostomy Musculoskeletal / Integumentary:  -- Wounds or skin discoloration: None appreciated -- Extremities: B/L UE and LE FROM, hands and feet warm, no edema  Neurologic:  -- Motor function: intact and symmetric -- Sensation: intact and symmetric  Labs:  CBC:  Lab Results  Component Value Date   WBC 5.1 11/05/2015   RBC 3.64 (L) 11/05/2015   BMP:  Lab Results  Component Value Date   GLUCOSE 121 (H) 11/05/2015   CO2 26 11/05/2015   BUN 18 11/05/2015   CREATININE 0.82 11/05/2015   CREATININE 0.95 (H) 12/07/2014   CALCIUM 8.7 (L) 11/05/2015     Imaging studies:  CT Abdomen and Pelvis with Contrast (11/05/2015) 1. Mechanical small-bowel obstruction at the level of the mid small bowel, probably due to adhesions. No free air. No fluid collections. 2. Liver metastases appear stable. No new or progressive metastatic disease in the abdomen or pelvis. 3. Additional findings include stable mild cardiomegaly, trace pericardial  effusion/thickening, aortic atherosclerosis and mild sigmoid diverticulosis.  Assessment/Plan: (ICD-10's: K56.69, C66.9) 82 y.o. female with recurrent small bowel obstruction, likely attributable to adhesions, complicated by pertinent comorbidities including extensive re-operative abdominal surgical history and medical history including colon cancer, bladder cancer, DM, HTN, CAD, CHF, supraventricular tacchycardia, CKD, HLD, chronic anemia, DVT, GERD, unspecified thyroid disease, major depression disorder, and fibromyalgia.   - NPO, IVF   - nasogastric tube decompression  - serial abdominal exams and monitor bowel function  - medical management of co-morbidities as per medical team  - DVT prophylaxis  All of the above findings and recommendations were discussed with the patient  and her family, and all of her and family's questions were answered to their expressed satisfaction.  Thank you for the opportunity to participate in this patient's care.   -- Marilynne Drivers Rosana Hoes, MD, [place]: [facility] Office: [phone]

## 2015-11-05 NOTE — ED Notes (Signed)
Entered on wrong pt

## 2015-11-06 ENCOUNTER — Encounter (HOSPITAL_COMMUNITY): Payer: Self-pay | Admitting: Hematology & Oncology

## 2015-11-06 DIAGNOSIS — I10 Essential (primary) hypertension: Secondary | ICD-10-CM

## 2015-11-06 LAB — BASIC METABOLIC PANEL
ANION GAP: 8 (ref 5–15)
BUN: 14 mg/dL (ref 6–20)
CALCIUM: 8.1 mg/dL — AB (ref 8.9–10.3)
CO2: 27 mmol/L (ref 22–32)
CREATININE: 0.86 mg/dL (ref 0.44–1.00)
Chloride: 108 mmol/L (ref 101–111)
GFR calc Af Amer: >60 mL/min (ref >60)
GLUCOSE: 98 mg/dL (ref 65–99)
Potassium: 3.6 mmol/L (ref 3.5–5.1)
Sodium: 143 mmol/L (ref 135–145)

## 2015-11-06 LAB — CBC
HCT: 36 % (ref 36.0–46.0)
Hemoglobin: 11.4 g/dL — ABNORMAL LOW (ref 12.0–15.0)
MCH: 28.9 pg (ref 26.0–34.0)
MCHC: 31.7 g/dL (ref 30.0–36.0)
MCV: 91.4 fL (ref 78.0–100.0)
PLATELETS: 193 10*3/uL (ref 150–400)
RBC: 3.94 MIL/uL (ref 3.87–5.11)
RDW: 13.9 % (ref 11.5–15.5)
WBC: 3.8 10*3/uL — ABNORMAL LOW (ref 4.0–10.5)

## 2015-11-06 MED ORDER — ISOSORBIDE MONONITRATE ER 30 MG PO TB24
15.0000 mg | ORAL_TABLET | Freq: Every day | ORAL | Status: DC
  Administered 2015-11-07: 15 mg via ORAL
  Filled 2015-11-06: qty 1

## 2015-11-06 MED ORDER — ALPRAZOLAM 0.5 MG PO TABS
0.5000 mg | ORAL_TABLET | Freq: Every day | ORAL | Status: DC
  Administered 2015-11-06: 0.5 mg via ORAL
  Filled 2015-11-06: qty 1

## 2015-11-06 MED ORDER — CARVEDILOL 3.125 MG PO TABS
18.7500 mg | ORAL_TABLET | Freq: Two times a day (BID) | ORAL | Status: DC
  Administered 2015-11-07: 18.75 mg via ORAL
  Filled 2015-11-06 (×2): qty 2

## 2015-11-06 MED ORDER — PANTOPRAZOLE SODIUM 40 MG PO TBEC
40.0000 mg | DELAYED_RELEASE_TABLET | Freq: Every day | ORAL | Status: DC
  Administered 2015-11-07: 40 mg via ORAL
  Filled 2015-11-06: qty 1

## 2015-11-06 MED ORDER — LEVOTHYROXINE SODIUM 75 MCG PO TABS: 75.0000 ug | ORAL_TABLET | Freq: Every day | ORAL | Status: DC

## 2015-11-06 MED ORDER — LEVOTHYROXINE SODIUM 75 MCG PO TABS
75.0000 ug | ORAL_TABLET | Freq: Every day | ORAL | Status: DC
  Administered 2015-11-07: 75 ug via ORAL
  Filled 2015-11-06: qty 1

## 2015-11-06 MED ORDER — FENTANYL 25 MCG/HR TD PT72: 25.0000 ug | MEDICATED_PATCH | TRANSDERMAL | Status: DC

## 2015-11-06 MED ORDER — ASPIRIN EC 325 MG PO TBEC
325.0000 mg | DELAYED_RELEASE_TABLET | Freq: Every day | ORAL | Status: DC
  Administered 2015-11-07: 325 mg via ORAL
  Filled 2015-11-06: qty 1

## 2015-11-06 NOTE — Progress Notes (Signed)
PROGRESS NOTE    Karen [name]  [id number] DOB: [birth year] DOA: 11/05/2015 PCP: [name], MD    Brief Narrative:  82 yof with a hx of metastatic colon cancer, status post ileostomy, presents to the hospital with complaints of abdominal pain. While in the ED, she was found to have a small bowel obstruction in the LLQ. An NG tube placed and her abdominal pain improved. General surgery was consulted and she was admitted for further evaluation of a small bowel obstruction. She is now having bowel movements and NG tube has been removed. Anticipate discharge in next 24 hours.  Assessment & Plan:   Active Problems:   Hypothyroid   Ileostomy status (HCC)   Abdominal pain, acute   Colon cancer metastasized to liver (HCC)   SBO (small bowel obstruction) (HCC)   GERD (gastroesophageal reflux disease)   HTN (hypertension)  1. Small bowel obstruction. Likely related to adhesions. Abdominal xray revealed a small bowel obstrustion in the left lower quadrant. An NG tube was placed and general surgery following. She has started to have bowel movements and NG tube output has been minimal. Will discontinue NG and start on clear liquids . 2. Hypothyroidism. Change synthroid to po. 3. History of paroxysmal supraventricular tachycardia. Patient is on beta blockers. Restart coreg.  4. Hypertension. Blood pressure currently stable. Resume home regimen 5. GERD. Restart PPI 6. Metastatic colon cancer. Follow-up with oncology for further management  DVT prophylaxis: Lovenox  Code Status: Full  Family Communication: Son at bedside Disposition Plan: Discharge home once improved.    Consultants:   General surgery   Procedures:   NG tube   Antimicrobials:   None    Subjective: Emptied ileostomy bag 9 times. No vomiting. Abdominal distention improved  Objective: Vitals:   11/05/15 1400 11/05/15 1503 11/05/15 2213 11/06/15 0601  BP: (!) 137/54 (!) 146/46 134/67 125/74  Pulse: 64 66 75  66  Resp:  16 17 17   Temp:  98.9 F (37.2 C) 98.9 F (37.2 C) 98.1 F (36.7 C)  TempSrc:  Oral Oral Oral  SpO2: 92% 93% 96% 98%  Weight:  64.9 kg (143 lb)    Height:  5' (1.524 m)      Intake/Output Summary (Last 24 hours) at 11/06/15 0633 Last data filed at 11/06/15 0300  Gross per 24 hour  Intake              550 ml  Output              100 ml  Net              450 ml   Filed Weights   11/05/15 0712 11/05/15 1503  Weight: 62.6 kg (138 lb) 64.9 kg (143 lb)    Examination:  General exam: Appears calm and comfortable  Respiratory system: Clear to auscultation. Respiratory effort normal. Cardiovascular system: S1 & S2 heard, RRR. No JVD, murmurs, rubs, gallops or clicks. No pedal edema. Gastrointestinal system: Abdomen is nondistended, soft and nontender. No organomegaly or masses felt. Normal bowel sounds heard. ileostomy in RLQ Central nervous system: Alert and oriented. No focal neurological deficits. Extremities: Symmetric 5 x 5 power. Skin: No rashes, lesions or ulcers Psychiatry: Judgement and insight appear normal. Mood & affect appropriate.     Data Reviewed: I have personally reviewed following labs and imaging studies  CBC:  Recent Labs Lab 11/05/15 0722  WBC 5.1  NEUTROABS 3.6  HGB 10.9*  HCT 33.4*  MCV 91.8  PLT [id number]   Basic Metabolic Panel:  Recent Labs Lab 11/05/15 0804  NA 139  K 3.8  CL 105  CO2 26  GLUCOSE 121*  BUN 18  CREATININE 0.82  CALCIUM 8.7*   GFR: Estimated Creatinine Clearance: 43 mL/min (by C-G formula based on SCr of 0.82 mg/dL). Liver Function Tests:  Recent Labs Lab 11/05/15 0804  AST 23  ALT 16  ALKPHOS 59  BILITOT 0.6  PROT 6.8  ALBUMIN 3.8    Recent Labs Lab 11/05/15 0804  LIPASE 18   No results for input(s): AMMONIA in the last 168 hours. Coagulation Profile: No results for input(s): INR, PROTIME in the last 168 hours. Cardiac Enzymes: No results for input(s): CKTOTAL, CKMB, CKMBINDEX, TROPONINI  in the last 168 hours. BNP (last 3 results) No results for input(s): PROBNP in the last 8760 hours. HbA1C: No results for input(s): HGBA1C in the last 72 hours. CBG: No results for input(s): GLUCAP in the last 168 hours. Lipid Profile: No results for input(s): CHOL, HDL, LDLCALC, TRIG, CHOLHDL, LDLDIRECT in the last 72 hours. Thyroid Function Tests:  Recent Labs  11/05/15 0804  TSH 5.209*   Anemia Panel: No results for input(s): VITAMINB12, FOLATE, FERRITIN, TIBC, IRON, RETICCTPCT in the last 72 hours. Sepsis Labs: No results for input(s): PROCALCITON, LATICACIDVEN in the last 168 hours.  No results found for this or any previous visit (from the past 240 hour(s)).       Radiology Studies: Ct Abdomen Pelvis W Contrast  Result Date: 11/05/2015 CLINICAL DATA:  82 year old female with a history of colon cancer with recurrence in 2000 62 with liver metastases treated with thermal ablation, now presenting with generalized abdominal pain, nausea and vomiting, with small obstruction on radiographs from earlier today. Recent chemotherapy on 10/26/2015. EXAM: CT ABDOMEN AND PELVIS WITH CONTRAST TECHNIQUE: Multidetector CT imaging of the abdomen and pelvis was performed using the standard protocol following bolus administration of intravenous contrast. CONTRAST:  43mL ISOVUE-300 IOPAMIDOL (ISOVUE-300) INJECTION 61%, 178mL ISOVUE-300 IOPAMIDOL (ISOVUE-300) INJECTION 61% COMPARISON:  09/24/2015 CT abdomen/pelvis. FINDINGS: Lower chest: No significant pulmonary nodules or acute consolidative airspace disease. Stable mild cardiomegaly. Trace pericardial effusion/ thickening is not definitely changed. Hepatobiliary: No appreciable change in the liver masses. Lateral segment left liver lobe 3.3 x 2.2 cm hypodense mass (series 2/image 17), previously 3.5 x 2.2 cm, not appreciably changed. Hypodense segment 6 right liver lobe 1.6 x 1.0 cm mass (series 2/ image 23), previously 1.7 x 0.9 cm, not appreciably  changed. Separate peripheral segment 6 right liver lobe 1.0 x 0.6 cm mass (series 2/ image 23), previously 1.0 x 0.6 cm, stable. Hypodense 0.7 cm mass at the right liver capsule (series 2/ image 24), previously 0.7 cm, stable. Subcentimeter hypodense 0.6 cm lesion in the medial posterior right liver lobe is stable. No new liver lesions. Cholecystectomy. No biliary ductal dilatation. Pancreas: Normal, with no mass or duct dilation. Spleen: Normal size. No mass. Adrenals/Urinary Tract: No discrete adrenal nodules. No hydronephrosis. Simple 2.8 cm renal cyst in the interpolar right kidney. Simple 1.1 cm renal cyst in the lower left kidney. Subcentimeter hypodense renal cortical lesion in the lateral interpolar left kidney, too small to characterize, stable, probably a benign renal cyst. No additional renal lesions. Poorly visualized bladder due to streak artifact, with no gross bladder abnormality. Stomach/Bowel: Grossly normal stomach. Status post right hemicolectomy with end ileostomy in the ventral right abdominal wall. There is new mild-to-moderate dilatation of proximal to mid small bowel loops up  to the 3.7 cm diameter with associated air-fluid levels. Distal small bowel is collapsed. There is an apparent focal small bowel caliber transition in the anterior left paramedian abdomen (series 8/ image 49). No small bowel wall thickening, discrete mass or definite pneumatosis. The remnant left colon demonstrates mild sigmoid diverticulosis and nonspecific wall thickening at the junction of the descending and sigmoid colon, which appears stable and may represent postsurgical change. No new pericolonic fat stranding or new large bowel wall thickening. Stable prominent diastasis of the right ventral abdominal wall. Vascular/Lymphatic: Atherosclerotic nonaneurysmal abdominal aorta. Patent portal, splenic, hepatic and renal veins. No pathologically enlarged lymph nodes in the abdomen or pelvis. Reproductive: Status post  hysterectomy, with no abnormal findings at the vaginal cuff. No adnexal mass. Other: No pneumoperitoneum, ascites or focal fluid collection. Musculoskeletal: No aggressive appearing focal osseous lesions. Partially visualized left total hip arthroplasty. Mild thoracolumbar spondylosis. IMPRESSION: 1. Mechanical small-bowel obstruction at the level of the mid small bowel, probably due to adhesions. No free air. No fluid collections. 2. Liver metastases appear stable. No new or progressive metastatic disease in the abdomen or pelvis. 3. Additional findings include stable mild cardiomegaly, trace pericardial effusion/thickening, aortic atherosclerosis and mild sigmoid diverticulosis. Electronically Signed   By: Ilona Sorrel M.D.   On: 11/05/2015 12:03   Dg Chest Portable 1 View  Result Date: 11/05/2015 CLINICAL DATA:  Nasogastric tube placement. EXAM: PORTABLE CHEST 1 VIEW COMPARISON:  Radiographs 11/05/2015.  CT 09/24/2015. FINDINGS: Two views are submitted. The tip of the nasogastric tube projects below the diaphragm to the level of the mid stomach. Side hole is near the gastric cardia. There is stable cardiac enlargement. The mediastinal contours are stable. The lungs are clear. There is no pleural effusion or pneumothorax. IMPRESSION: Nasogastric tube extends into the mid stomach. Electronically Signed   By: Richardean Sale M.D.   On: 11/05/2015 13:54   Dg Abd Acute W/chest  Result Date: 11/05/2015 CLINICAL DATA:  Abdominal pain with nausea and vomiting this morning. Stage IV colon cancer. History is small bowel obstruction. EXAM: DG ABDOMEN ACUTE W/ 1V CHEST COMPARISON:  CT scans of the abdomen and chest dated 09/24/2015 and chest x-ray dated 06/12/2015 FINDINGS: There is a dilated loop of small bowel in the left lower quadrant. No free air or free fluid. The lungs are clear. No acute bone abnormality. Moderate arthritis of the right hip. Left total hip prosthesis. Aortic atherosclerosis. IMPRESSION:  Findings consistent with focal small-bowel obstruction in the left lower quadrant. Aortic atherosclerosis. Electronically Signed   By: Lorriane Shire M.D.   On: 11/05/2015 08:33        Scheduled Meds: . enoxaparin (LOVENOX) injection  40 mg Subcutaneous Q24H  . famotidine (PEPCID) IV  20 mg Intravenous Q24H  . levothyroxine  37.5 mcg Intravenous Daily  . metoprolol  5 mg Intravenous Q6H   Continuous Infusions: . 0.9 % NaCl with KCl 20 mEq / L 75 mL/hr at 11/05/15 1645     LOS: 1 day    Time spent: 25 minutes     Kathie Dike, MD [facility] If 7PM-7AM, please contact night-coverage [email or web address] Password [facility] 11/06/2015, 6:33 AM

## 2015-11-06 NOTE — Consult Note (Signed)
SURGICAL PROGRESS NOTE (cpt [phone])  Hospital Day(s): 1.   Post op day(s):  Marland Kitchen   Interval History: Patient seen and examined, no acute events or new complaints overnight. Patient reports needing to empty her ileostomy bag several times for both gas and stool, denies any further abdominal pain, nausea, vomiting, fever/chills, CP, or SOB.  Review of Systems:  Constitutional: denies fever, chills  HEENT: denies cough or congestion  Respiratory: denies any shortness of breath  Cardiovascular: denies chest pain or palpitations  Gastrointestinal: denies N/V, bowel function as per HPI  Musculoskeletal: denies pain, decreased motor or sensation  Neurological: denies HA or vision/hearing changes   Vital signs in last 24 hours: [min-max] current  Temp:  [98.1 F (36.7 C)-98.9 F (37.2 C)] 98.1 F (36.7 C) (09/26 0601) Pulse Rate:  [62-75] 66 (09/26 0601) Resp:  [16-17] 17 (09/26 0601) BP: (125-154)/(46-74) 125/74 (09/26 0601) SpO2:  [92 %-98 %] 98 % (09/26 0601) Weight:  [64.9 kg (143 lb)] 64.9 kg (143 lb) (09/25 1503)     Height: 5' (152.4 cm) Weight: 64.9 kg (143 lb) BMI (Calculated): 28   Intake/Output this shift:  Total I/O In: 0  Out: 150 [Urine:100; Stool:50]   Intake/Output last 2 shifts:  @IOLAST2SHIFTS @   Physical Exam:  Constitutional: alert, cooperative and no distress  HENT: normocephalic without obvious abnormality  Eyes: PERRL, EOM's grossly intact and symmetric  Neuro: CN II - XII grossly intact and symmetric without deficit  Respiratory: breathing non-labored at rest  Cardiovascular: regular rate and sinus rhythm  Gastrointestinal: soft, non-tender, and non-distended, pink healthy ileostomy with stool and gas in bag  Musculoskeletal: UE and LE FROM, no edema or wounds, motor and sensation grossly intact  Labs:  CBC:  Lab Results  Component Value Date   WBC 3.8 (L) 11/06/2015   RBC 3.94 11/06/2015   BMP:  Lab Results  Component Value Date   GLUCOSE 98  11/06/2015   CO2 27 11/06/2015   BUN 14 11/06/2015   CREATININE 0.86 11/06/2015   CREATININE 0.95 (H) 12/07/2014   CALCIUM 8.1 (L) 11/06/2015     Imaging studies: No new pertinent imaging studies   Assessment/Plan: (ICD-10's: K56.69, C18.9) 82 y.o. female with resolving recurrent small bowel obstruction, likely attributable to adhesions, complicated by pertinent comorbidities including extensive re-operative abdominal surgical and medical history including colon cancer, bladder cancer, DM, HTN, CAD, CHF, supraventricular tacchycardia, CKD, HLD, chronic anemia, DVT, GERD, unspecified thyroid disease, major depression disorder, and fibromyalgia.              - remove NGT and start clear liquids diet             - serial abdominal exams and monitor bowel function             - medical management of co-morbidities as per medical team  - discharge planning as per primary team             - DVT prophylaxis  All of the above findings and recommendations were discussed with the patient and her family, and all of her and family's questions were answered to their expressed satisfaction.  Thank you for the opportunity to participate in this patient's care.   -- Marilynne Drivers Rosana Hoes, MD, [place]: [place] [facility] Office: [phone]

## 2015-11-07 DIAGNOSIS — K5669 Other intestinal obstruction: Secondary | ICD-10-CM

## 2015-11-07 LAB — BASIC METABOLIC PANEL
Anion gap: 8 (ref 5–15)
BUN: 13 mg/dL (ref 6–20)
CHLORIDE: 107 mmol/L (ref 101–111)
CO2: 26 mmol/L (ref 22–32)
CREATININE: 0.81 mg/dL (ref 0.44–1.00)
Calcium: 7.9 mg/dL — ABNORMAL LOW (ref 8.9–10.3)
GFR calc Af Amer: >60 mL/min (ref >60)
GLUCOSE: 105 mg/dL — AB (ref 65–99)
POTASSIUM: 3.2 mmol/L — AB (ref 3.5–5.1)
SODIUM: 141 mmol/L (ref 135–145)

## 2015-11-07 MED ORDER — POTASSIUM CHLORIDE CRYS ER 20 MEQ PO TBCR
40.0000 meq | EXTENDED_RELEASE_TABLET | Freq: Once | ORAL | Status: AC
  Administered 2015-11-07: 40 meq via ORAL
  Filled 2015-11-07: qty 2

## 2015-11-07 NOTE — Progress Notes (Signed)
PROGRESS NOTE  ROSALEAH PROA [id number] DOB: 26-Mar-1929 DOA: 11/05/2015 PCP: [name], MD  Brief Narrative: 82 year-old female with a past medical history of metastatic colon cancer status post ileostomy, CKD stage 3, DVT, GERD, HTN, and HLD presents with complaints of abdominal pain. She was admitted for a small bowel obstruction. She is gradually improved with conservative management and diet has been advanced per surgery.  Assessment/Plan: 1. Small bowel obstruction. Resolved. Tolerating diet. Thought secondary to adhesions. Gen. surgery has advance diet and has cleared patient for discharge. 2. Iron deficiency anemia. Stable. 3. CKD stage 3. Creatinine is at baseline.  4. Hypothyroidism. TSH modestly elevated. Continue Synthroid. Recheck TSH as an outpatient. 5. Hypokalemia. Replace.  6. Metastatic colon cancer. Follow-up oncology for further management  7. Subcapsular mass left anterior pole kidney suspicious for RCC 8. Aortic atherosclerosis seen on imaging   Doing well. Tolerating diet. Small bowel obstruction appears resolved.  Replace potassium  Discharge home   Summary of records reviewed: Oncology note 9/15: Patient followed for colon carcinoma, rising CEA with 1.3 cm mass left hepatic lobe, possible renal cell carcinoma. Notation made that the patient has had difficulty discussing end-of-life issues and is full code.  DVT prophylaxis: Lovenox  Code Status: Full  Family Communication:Daugter and son-in-law bedside Disposition Plan: Discharge home today.   Murray Hodgkins, MD  [facility] Direct contact: [phone] --Via amion app OR  --[email or web address]; password [email or web address]  7PM-7AM contact night coverage as above 11/07/2015, 6:35 AM  LOS: 2 days   Consultants:  General surgery   Procedures:  NG tube   Antimicrobials:  None   CC: Follow-up small bowel obstruction   Interval history/Subjective: Feels well. No problems overnight. Denies any  pain today. Eating well. Denies any nausea or vomiting.   ROS: No SOB   Objective: Vitals:   11/06/15 0601 11/06/15 1342 11/06/15 2247 11/07/15 0519  BP: 125/74 (!) 134/58 (!) 130/41 (!) 140/44  Pulse: 66 62 (!) 58 (!) 55  Resp: 17 18 15 20   Temp: 98.1 F (36.7 C) 97.6 F (36.4 C) 99 F (37.2 C) 98.4 F (36.9 C)  TempSrc: Oral Oral Oral Oral  SpO2: 98% 98% 97% 96%  Weight:      Height:        Intake/Output Summary (Last 24 hours) at 11/07/15 0635 Last data filed at 11/07/15 0523  Gross per 24 hour  Intake              240 ml  Output             2400 ml  Net            -2160 ml     Filed Weights   11/05/15 0712 11/05/15 1503  Weight: 62.6 kg (138 lb) 64.9 kg (143 lb)    Exam:  Constitutional:  . Appears calm and comfortable Respiratory:  . CTA bilaterally, no w/r/r.  . Respiratory effort normal. No retractions or accessory muscle use Cardiovascular:  . RRR, no m/r/g . No LE extremity edema   Abdomen:   soft, non tender, and non distended.  Marland Kitchen Positive bowel sounds.   I have personally reviewed following labs and imaging studies:  Potassium 3.2  Scheduled Meds: . ALPRAZolam  0.5 mg Oral QHS  . aspirin EC  325 mg Oral Daily  . carvedilol  18.75 mg Oral BID WC  . enoxaparin (LOVENOX) injection  40 mg Subcutaneous Q24H  . famotidine (PEPCID) IV  20 mg Intravenous Q24H  .  fentaNYL  25 mcg Transdermal Q72H  . isosorbide mononitrate  15 mg Oral Daily  . levothyroxine  75 mcg Oral QAC breakfast  . pantoprazole  40 mg Oral Daily   Continuous Infusions: . 0.9 % NaCl with KCl 20 mEq / L 75 mL/hr at 11/06/15 2141    Active Problems:   Hypothyroid   Ileostomy status (HCC)   Abdominal pain, acute   Colon cancer metastasized to liver (HCC)   SBO (small bowel obstruction) (HCC)   GERD (gastroesophageal reflux disease)   HTN (hypertension)   LOS: 2 days       By signing my name below, I, Collene Leyden, attest that this documentation has been prepared  under the direction and in the presence of Murray Hodgkins, MD. Electronically signed: Collene Leyden, Scribe. 09/27/171:53 PM  I personally performed the services described in this documentation. All medical record entries made by the scribe were at my direction. I have reviewed the chart and agree that the record reflects my personal performance and is accurate and complete. Murray Hodgkins, MD

## 2015-11-07 NOTE — Progress Notes (Signed)
SURGICAL PROGRESS NOTE (cpt [phone])  Hospital Day(s): 2.   Post op day(s):  Marland Kitchen   Interval History: Patient seen and examined, no acute events or new complaints overnight. Patient reports her ileostomy has continued to function WNL, and she's continued to tolerate clear liquids and wishes to eat more solid foods. She otherwise denies any further abdominal pain, N/V, fever/chills, CP, or SOB.  Review of Systems:  Constitutional: denies fever, chills  HEENT: denies cough or congestion  Respiratory: denies any shortness of breath  Cardiovascular: denies chest pain or palpitations  Gastrointestinal: denies N/V, diarrhea or constipation  Musculoskeletal: denies pain, decreased motor or sensation  Neurological: denies HA or vision/hearing changes   Vital signs in last 24 hours: [min-max] current  Temp:  [97.6 F (36.4 C)-99 F (37.2 C)] 98.4 F (36.9 C) (09/27 0519) Pulse Rate:  [55-62] 55 (09/27 0519) Resp:  [15-20] 20 (09/27 0519) BP: (130-140)/(41-58) 140/44 (09/27 0519) SpO2:  [96 %-98 %] 96 % (09/27 0519)     Height: 5' (152.4 cm) Weight: 64.9 kg (143 lb) BMI (Calculated): 28   Intake/Output this shift:  No intake/output data recorded.   Intake/Output last 2 shifts:  @IOLAST2SHIFTS @   Physical Exam:  Constitutional: alert, cooperative and no distress  HENT: normocephalic without obvious abnormality  Eyes: PERRL, EOM's grossly intact and symmetric  Neuro: CN II - XII grossly intact and symmetric without deficit  Respiratory: breathing non-labored at rest  Cardiovascular: regular rate and sinus rhythm  Gastrointestinal: soft, non-tender, and non-distended, pink healthy-appearing ileostomy with drainage of stool and gas into patient's ostomy bag Musculoskeletal: UE and LE FROM, no edema or wounds, motor and sensation grossly intact   Labs:  CBC:  Lab Results  Component Value Date   WBC 3.8 (L) 11/06/2015   RBC 3.94 11/06/2015   BMP:  Lab Results  Component Value Date    GLUCOSE 105 (H) 11/07/2015   CO2 26 11/07/2015   BUN 13 11/07/2015   CREATININE 0.81 11/07/2015   CREATININE 0.95 (H) 12/07/2014   CALCIUM 7.9 (L) 11/07/2015     Imaging studies: No new pertinent imaging studies   Assessment/Plan: (ICD-10's: K56.69, C18.9) 82 y.o.femalewith resolving recurrent small bowel obstruction, likely attributable to adhesions, complicated by pertinent comorbidities including extensive re-operative abdominal surgical and medical history including colon cancer, bladder cancer, DM, HTN, CAD, CHF, supraventricular tacchycardia, CKD, HLD, chronic anemia, DVT, GERD, unspecified thyroid disease, major depression disorder, and fibromyalgia.  - advance diet as tolerated - serial abdominal exams and monitor bowel function - medical management of co-morbidities as per medical team             - discharge planning as per primary team - DVT prophylaxis  All of the above findings and recommendations were discussed with the patient and her family, and all of her and family's questions were answered to their expressed satisfaction.  Thank you for the opportunity to participate in this patient's care.   -- Marilynne Drivers Rosana Hoes, MD, [place]: [place] [facility] Office: [phone]

## 2015-11-07 NOTE — Consult Note (Signed)
   [facility] CM Inpatient Consult   11/07/2015  Nyomie Lozo [facility] 1929-09-01 ST:[id number]    Spoke with patient, spouse and daughter at bedside regarding [facility] services. Patient does not want to participate with [facility] at this time. Patient given [facility] brochure and contact information for future reference, voices appreciation of information.    Inpatient case manager aware that patient offered [facility] case management services but declined.   Of note, [facility] Care Management services would not replace or interfere with any services that are arranged by inpatient case management or social work. For additional questions or referrals please contact:   Royetta Crochet. Laymond Purser, RN, BSN, [place] [facility] Liaison [phone]

## 2015-11-07 NOTE — Discharge Summary (Signed)
Physician Discharge Summary  Karen [name] [id number] DOB: [birth year] DOA: 11/05/2015  PCP: [name], MD  Admit date: 11/05/2015 Discharge date: 11/07/2015    Follow-up Information    [name], MD Follow up today.   Specialty:  Family Medicine Why:  as needed Contact information: [address] [place] [address] [phone]          Discharge Diagnoses:  1. Small bowel obstruction  2. Iron deficiency anemia  3. CKD stage III 4. Metastatic colon cancer 5. Atherosclerosis of the aorta  Discharge Condition: Stable  Disposition: Home   Diet recommendation: general  Filed Weights   11/05/15 0712 11/05/15 1503  Weight: 62.6 kg (138 lb) 64.9 kg (143 lb)    History of present illness:  82 year-old woman with a past medical history of metastatic colon cancer status post ileostomy, CKD stage III, presented with abdominal pain. She was admitted for a small bowel obstruction.   Hospital Course:  She was seen by general surgery and consult dictation and treated conservatively with gradual and spontaneous resolution small bowel obstruction. Hospitalization was uncomplicated. Diet was advanced and she was felt stable for discharge.  1. Small bowel obstruction. Resolved. Tolerating diet. Thought secondary to adhesions. Gen. surgery has advance diet and has cleared patient for discharge. 2. Iron deficiency anemia. Stable. 3. CKD stage 3. Creatinine is at baseline.  4. Hypothyroidism. TSH modestly elevated. Continue Synthroid. Recheck TSH as an outpatient. 5. Hypokalemia. Replace.  6. Metastatic colon cancer. Follow-up oncology for further management  7. Subcapsular mass left anterior pole kidney suspicious for RCC 8. Aortic atherosclerosis seen on imaging   Consultants:  General surgery   Procedures:  NG tube   Antimicrobials:  None    Discharge Instructions  Discharge Instructions    Activity as tolerated - No restrictions    Complete by:  As  directed    Diet general    Complete by:  As directed    Discharge instructions    Complete by:  As directed    Call your physician or seek immediate medical attention for abdominal pain, nausea, vomiting, inability to eat, fever or worsening of condition.       Medication List    STOP taking these medications   diphenoxylate-atropine 2.5-0.025 MG tablet Commonly known as:  LOMOTIL     TAKE these medications   ALPRAZolam 0.5 MG tablet Commonly known as:  XANAX Take 0.5 mg by mouth at bedtime.   aspirin EC 325 MG tablet Take 325 mg by mouth daily.   CALTRATE 600 PO Take 1 tablet by mouth daily.   carvedilol 12.5 MG tablet Commonly known as:  COREG Take 1.5 tablets (18.75 mg total) by mouth 2 (two) times daily with a meal.   CENTRUM WOMEN PO Take by mouth daily.   fentaNYL 25 MCG/HR patch Commonly known as:  DURAGESIC - dosed mcg/hr Place 1 patch (25 mcg total) onto the skin every 3 (three) days.   Fish Oil 1000 MG Caps Take 1 capsule by mouth daily. Reported on 02/19/2015   HYDROcodone-acetaminophen 7.5-325 MG tablet Commonly known as:  NORCO Take 1 tablet by mouth every 4 (four) hours as needed. What changed:  reasons to take this   hyoscyamine 0.125 MG SL tablet Commonly known as:  LEVSIN SL Place 1 tablet (0.125 mg total) under the tongue 3 (three) times daily as needed (lower abdominal pain).   isosorbide mononitrate 30 MG 24 hr tablet Commonly known as:  IMDUR Take  15 mg by mouth daily.   levothyroxine 75 MCG tablet Commonly known as:  SYNTHROID, LEVOTHROID Take 75 mcg by mouth daily.   magnesium oxide 400 (241.3 Mg) MG tablet Commonly known as:  MAG-OX Take 1 tablet (400 mg total) by mouth 4 (four) times daily.   NITROSTAT 0.4 MG SL tablet Generic drug:  nitroGLYCERIN DISSOLVE ONE TABLET UNDER THE TONGUE EVERY 5 MINUTES AS NEEDED FOR CHEST PAIN.  DO NOT EXCEED A TOTAL OF 3 DOSES IN 15 MINUTES   nystatin-triamcinolone cream Commonly known as:   MYCOLOG II Apply 1 application topically 2 (two) times daily.   ondansetron 8 MG disintegrating tablet Commonly known as:  ZOFRAN ODT Take 1 tablet (8 mg total) by mouth every 8 (eight) hours as needed for nausea or vomiting.   pantoprazole 40 MG tablet Commonly known as:  PROTONIX Take 40 mg by mouth daily.   prochlorperazine 10 MG tablet Commonly known as:  COMPAZINE Take 1 tablet (10 mg total) by mouth every 6 (six) hours as needed for nausea or vomiting.   VECTIBIX IV Inject into the vein. To start 04/20/15. To be given every 2 weeks.      Allergies  Allergen Reactions  . Bactrim [Sulfamethoxazole-Trimethoprim] Shortness Of Breath and Other (See Comments)    Hurting all over  . Codeine Palpitations and Other (See Comments)    Heart races  . Demerol Palpitations and Other (See Comments)    tachycardia  . Lidocaine Hcl Other (See Comments)    tachycardia  . Morphine And Related Palpitations    Tachycardia   . Dilaudid [Hydromorphone Hcl] Other (See Comments)    Hallucinations  . Nitrofurantoin Monohyd Macro Other (See Comments)    Unknown   . Lisinopril Cough  . Ramipril Cough    The results of significant diagnostics from this hospitalization (including imaging, microbiology, ancillary and laboratory) are listed below for reference.    Significant Diagnostic Studies: Ct Abdomen Pelvis W Contrast  Result Date: 11/05/2015 CLINICAL DATA:  82 year old female with a history of colon cancer with recurrence in 2000 62 with liver metastases treated with thermal ablation, now presenting with generalized abdominal pain, nausea and vomiting, with small obstruction on radiographs from earlier today. Recent chemotherapy on 10/26/2015. EXAM: CT ABDOMEN AND PELVIS WITH CONTRAST TECHNIQUE: Multidetector CT imaging of the abdomen and pelvis was performed using the standard protocol following bolus administration of intravenous contrast. CONTRAST:  93mL ISOVUE-300 IOPAMIDOL  (ISOVUE-300) INJECTION 61%, 176mL ISOVUE-300 IOPAMIDOL (ISOVUE-300) INJECTION 61% COMPARISON:  09/24/2015 CT abdomen/pelvis. FINDINGS: Lower chest: No significant pulmonary nodules or acute consolidative airspace disease. Stable mild cardiomegaly. Trace pericardial effusion/ thickening is not definitely changed. Hepatobiliary: No appreciable change in the liver masses. Lateral segment left liver lobe 3.3 x 2.2 cm hypodense mass (series 2/image 17), previously 3.5 x 2.2 cm, not appreciably changed. Hypodense segment 6 right liver lobe 1.6 x 1.0 cm mass (series 2/ image 23), previously 1.7 x 0.9 cm, not appreciably changed. Separate peripheral segment 6 right liver lobe 1.0 x 0.6 cm mass (series 2/ image 23), previously 1.0 x 0.6 cm, stable. Hypodense 0.7 cm mass at the right liver capsule (series 2/ image 24), previously 0.7 cm, stable. Subcentimeter hypodense 0.6 cm lesion in the medial posterior right liver lobe is stable. No new liver lesions. Cholecystectomy. No biliary ductal dilatation. Pancreas: Normal, with no mass or duct dilation. Spleen: Normal size. No mass. Adrenals/Urinary Tract: No discrete adrenal nodules. No hydronephrosis. Simple 2.8 cm renal cyst in the  interpolar right kidney. Simple 1.1 cm renal cyst in the lower left kidney. Subcentimeter hypodense renal cortical lesion in the lateral interpolar left kidney, too small to characterize, stable, probably a benign renal cyst. No additional renal lesions. Poorly visualized bladder due to streak artifact, with no gross bladder abnormality. Stomach/Bowel: Grossly normal stomach. Status post right hemicolectomy with end ileostomy in the ventral right abdominal wall. There is new mild-to-moderate dilatation of proximal to mid small bowel loops up to the 3.7 cm diameter with associated air-fluid levels. Distal small bowel is collapsed. There is an apparent focal small bowel caliber transition in the anterior left paramedian abdomen (series 8/ image 49).  No small bowel wall thickening, discrete mass or definite pneumatosis. The remnant left colon demonstrates mild sigmoid diverticulosis and nonspecific wall thickening at the junction of the descending and sigmoid colon, which appears stable and may represent postsurgical change. No new pericolonic fat stranding or new large bowel wall thickening. Stable prominent diastasis of the right ventral abdominal wall. Vascular/Lymphatic: Atherosclerotic nonaneurysmal abdominal aorta. Patent portal, splenic, hepatic and renal veins. No pathologically enlarged lymph nodes in the abdomen or pelvis. Reproductive: Status post hysterectomy, with no abnormal findings at the vaginal cuff. No adnexal mass. Other: No pneumoperitoneum, ascites or focal fluid collection. Musculoskeletal: No aggressive appearing focal osseous lesions. Partially visualized left total hip arthroplasty. Mild thoracolumbar spondylosis. IMPRESSION: 1. Mechanical small-bowel obstruction at the level of the mid small bowel, probably due to adhesions. No free air. No fluid collections. 2. Liver metastases appear stable. No new or progressive metastatic disease in the abdomen or pelvis. 3. Additional findings include stable mild cardiomegaly, trace pericardial effusion/thickening, aortic atherosclerosis and mild sigmoid diverticulosis. Electronically Signed   By: Ilona Sorrel M.D.   On: 11/05/2015 12:03   Dg Chest Portable 1 View  Result Date: 11/05/2015 CLINICAL DATA:  Nasogastric tube placement. EXAM: PORTABLE CHEST 1 VIEW COMPARISON:  Radiographs 11/05/2015.  CT 09/24/2015. FINDINGS: Two views are submitted. The tip of the nasogastric tube projects below the diaphragm to the level of the mid stomach. Side hole is near the gastric cardia. There is stable cardiac enlargement. The mediastinal contours are stable. The lungs are clear. There is no pleural effusion or pneumothorax. IMPRESSION: Nasogastric tube extends into the mid stomach. Electronically Signed    By: Richardean Sale M.D.   On: 11/05/2015 13:54   Dg Abd Acute W/chest  Result Date: 11/05/2015 CLINICAL DATA:  Abdominal pain with nausea and vomiting this morning. Stage IV colon cancer. History is small bowel obstruction. EXAM: DG ABDOMEN ACUTE W/ 1V CHEST COMPARISON:  CT scans of the abdomen and chest dated 09/24/2015 and chest x-ray dated 06/12/2015 FINDINGS: There is a dilated loop of small bowel in the left lower quadrant. No free air or free fluid. The lungs are clear. No acute bone abnormality. Moderate arthritis of the right hip. Left total hip prosthesis. Aortic atherosclerosis. IMPRESSION: Findings consistent with focal small-bowel obstruction in the left lower quadrant. Aortic atherosclerosis. Electronically Signed   By: Lorriane Shire M.D.   On: 11/05/2015 08:33   Labs: Basic Metabolic Panel:  Recent Labs Lab 11/05/15 0804 11/06/15 0638 11/07/15 0617  NA 139 143 141  K 3.8 3.6 3.2*  CL 105 108 107  CO2 26 27 26   GLUCOSE 121* 98 105*  BUN 18 14 13   CREATININE 0.82 0.86 0.81  CALCIUM 8.7* 8.1* 7.9*   Liver Function Tests:  Recent Labs Lab 11/05/15 0804  AST 23  ALT 16  ALKPHOS 33  BILITOT 0.6  PROT 6.8  ALBUMIN 3.8    Recent Labs Lab 11/05/15 0804  LIPASE 18   CBC:  Recent Labs Lab 11/05/15 0722 11/06/15 0638  WBC 5.1 3.8*  NEUTROABS 3.6  --   HGB 10.9* 11.4*  HCT 33.4* 36.0  MCV 91.8 91.4  PLT 182 193    Active Problems:   Hypothyroid   Ileostomy status (HCC)   Abdominal pain, acute   Colon cancer metastasized to liver (HCC)   SBO (small bowel obstruction) (HCC)   GERD (gastroesophageal reflux disease)   HTN (hypertension)   Time coordinating discharge: 35 minutes   Signed:  Murray Hodgkins, MD [facility] 11/07/2015, 6:11 PM By signing my name below, I, Collene Leyden, attest that this documentation has been prepared under the direction and in the presence of Murray Hodgkins, MD. Electronically signed: Collene Leyden, Scribe.  11/07/15 1:53 PM   I personally performed the services described in this documentation. All medical record entries made by the scribe were at my direction. I have reviewed the chart and agree that the record reflects my personal performance and is accurate and complete. Murray Hodgkins, MD

## 2015-11-07 NOTE — Care Management Important Message (Signed)
Important Message  Patient Details  Name: DEBORRA COSSEY MRN: [id number] Date of Birth: 1929-05-01   Medicare Important Message Given:  Yes    Lillan Mccreadie, Chauncey [place], RN 11/07/2015, 11:59 AM

## 2015-11-07 NOTE — Progress Notes (Signed)
Patient states understanding of discharge instructuins

## 2015-11-09 ENCOUNTER — Inpatient Hospital Stay (HOSPITAL_COMMUNITY): Payer: Medicare Other

## 2015-11-09 ENCOUNTER — Encounter (HOSPITAL_COMMUNITY): Payer: Medicare Other

## 2015-11-09 ENCOUNTER — Encounter (HOSPITAL_BASED_OUTPATIENT_CLINIC_OR_DEPARTMENT_OTHER): Payer: Medicare Other

## 2015-11-09 DIAGNOSIS — C787 Secondary malignant neoplasm of liver and intrahepatic bile duct: Principal | ICD-10-CM

## 2015-11-09 DIAGNOSIS — C189 Malignant neoplasm of colon, unspecified: Secondary | ICD-10-CM | POA: Diagnosis not present

## 2015-11-09 DIAGNOSIS — R300 Dysuria: Secondary | ICD-10-CM | POA: Diagnosis not present

## 2015-11-09 DIAGNOSIS — Z5111 Encounter for antineoplastic chemotherapy: Secondary | ICD-10-CM

## 2015-11-09 LAB — COMPREHENSIVE METABOLIC PANEL
ALBUMIN: 3.3 g/dL — AB (ref 3.5–5.0)
ALT: 23 U/L (ref 14–54)
AST: 35 U/L (ref 15–41)
Alkaline Phosphatase: 54 U/L (ref 38–126)
Anion gap: 5 (ref 5–15)
BILIRUBIN TOTAL: 0.4 mg/dL (ref 0.3–1.2)
BUN: 14 mg/dL (ref 6–20)
CHLORIDE: 112 mmol/L — AB (ref 101–111)
CO2: 24 mmol/L (ref 22–32)
Calcium: 8.1 mg/dL — ABNORMAL LOW (ref 8.9–10.3)
Creatinine, Ser: 0.78 mg/dL (ref 0.44–1.00)
Glucose, Bld: 119 mg/dL — ABNORMAL HIGH (ref 65–99)
POTASSIUM: 3.8 mmol/L (ref 3.5–5.1)
SODIUM: 141 mmol/L (ref 135–145)
Total Protein: 6.1 g/dL — ABNORMAL LOW (ref 6.5–8.1)

## 2015-11-09 LAB — CBC WITH DIFFERENTIAL/PLATELET
BASOS ABS: 0 10*3/uL (ref 0.0–0.1)
BASOS PCT: 0 %
EOS ABS: 0.1 10*3/uL (ref 0.0–0.7)
Eosinophils Relative: 3 %
HEMATOCRIT: 30.5 % — AB (ref 36.0–46.0)
HEMOGLOBIN: 9.9 g/dL — AB (ref 12.0–15.0)
Lymphocytes Relative: 20 %
Lymphs Abs: 0.7 10*3/uL (ref 0.7–4.0)
MCH: 29.4 pg (ref 26.0–34.0)
MCHC: 32.5 g/dL (ref 30.0–36.0)
MCV: 90.5 fL (ref 78.0–100.0)
MONO ABS: 0.4 10*3/uL (ref 0.1–1.0)
Monocytes Relative: 12 %
NEUTROS ABS: 2.4 10*3/uL (ref 1.7–7.7)
NEUTROS PCT: 65 %
Platelets: 183 10*3/uL (ref 150–400)
RBC: 3.37 MIL/uL — ABNORMAL LOW (ref 3.87–5.11)
RDW: 13.8 % (ref 11.5–15.5)
WBC: 3.7 10*3/uL — ABNORMAL LOW (ref 4.0–10.5)

## 2015-11-09 LAB — MAGNESIUM: MAGNESIUM: 0.9 mg/dL — AB (ref 1.7–2.4)

## 2015-11-09 MED ORDER — SODIUM CHLORIDE 0.9 % IV SOLN
4.9980 mg/kg | Freq: Once | INTRAVENOUS | Status: AC
  Administered 2015-11-09: 340 mg via INTRAVENOUS
  Filled 2015-11-09: qty 17

## 2015-11-09 MED ORDER — SODIUM CHLORIDE 0.9 % IV SOLN
Freq: Once | INTRAVENOUS | Status: AC
  Administered 2015-11-09: 12:00:00 via INTRAVENOUS

## 2015-11-09 MED ORDER — MAGNESIUM SULFATE 4 GM/100ML IV SOLN
4.0000 g | Freq: Once | INTRAVENOUS | Status: AC
  Administered 2015-11-09: 4 g via INTRAVENOUS
  Filled 2015-11-09: qty 100

## 2015-11-09 MED ORDER — SODIUM CHLORIDE 0.9% FLUSH: 10.0000 mL | INTRAVENOUS | Status: DC | PRN

## 2015-11-09 NOTE — Patient Instructions (Signed)
[facility] Discharge Instructions for Patients Receiving Chemotherapy   Beginning January 23rd 2017 lab work for the [facility] will be done in the  Main lab at [facility] on 1st floor. If you have a lab appointment with the [facility] please come in thru the  Main Entrance and check in at the main information desk   Today you received the following chemotherapy agent: Vectibix.     If you develop nausea and vomiting, or diarrhea that is not controlled by your medication, call the clinic.  The clinic phone number is [phone]. Office hours are Monday-Friday 8:30am-5:00pm.  BELOW ARE SYMPTOMS THAT SHOULD BE REPORTED IMMEDIATELY:  *FEVER GREATER THAN 101.0 F  *CHILLS WITH OR WITHOUT FEVER  NAUSEA AND VOMITING THAT IS NOT CONTROLLED WITH YOUR NAUSEA MEDICATION  *UNUSUAL SHORTNESS OF BREATH  *UNUSUAL BRUISING OR BLEEDING  TENDERNESS IN MOUTH AND THROAT WITH OR WITHOUT PRESENCE OF ULCERS  *URINARY PROBLEMS  *BOWEL PROBLEMS  UNUSUAL RASH Items with * indicate a potential emergency and should be followed up as soon as possible. If you have an emergency after office hours please contact your primary care physician or go to the nearest emergency department.  Please call the clinic during office hours if you have any questions or concerns.   You may also contact the Patient Navigator at [phone] should you have any questions or need assistance in obtaining follow up care.      Resources For Cancer Patients and their Caregivers ? American Cancer Society: Can assist with transportation, wigs, general needs, runs Look Good Feel Better.        331-124-9674 ? Cancer Care: Provides financial assistance, online support groups, medication/co-pay assistance.  1-800-813-HOPE (605) 408-3239) ? [place] Assists [place] cancer patients and their families through emotional , educational and financial support.   (920)706-6741 ? [place] DSS Where to apply for food stamps, Medicaid and utility assistance. 743-540-7507 ? [employer]: Transportation to medical appointments. (514)122-8475 ? Social Security Administration: May apply for disability if have a Stage IV cancer. 5313803670 743-531-4961 ? [employer], Disability and Transit Services: Assists with nutrition, care and transit needs. [phone]

## 2015-11-09 NOTE — Progress Notes (Signed)
CRITICAL VALUE ALERT Critical value received:  Mag-0.9 Date of notification:  11/09/2015 Time of notification: 1200 Critical value read back:  Yes.   Nurse who received alert:  Josephina Shih, RN  MD notified (1st page):  Dr. Libero Puthoff Muse, magnesium IV ordered and started STAT  Dr. Travonte Byard Muse wishes to continue Vectibix as ordered after reviewing labs and patient assessment report.

## 2015-11-10 LAB — CEA: CEA: 232.3 ng/mL — AB (ref 0.0–4.7)

## 2015-11-12 ENCOUNTER — Encounter (HOSPITAL_COMMUNITY): Payer: Medicare Other | Attending: Hematology and Oncology

## 2015-11-12 DIAGNOSIS — C787 Secondary malignant neoplasm of liver and intrahepatic bile duct: Secondary | ICD-10-CM

## 2015-11-12 DIAGNOSIS — D509 Iron deficiency anemia, unspecified: Secondary | ICD-10-CM | POA: Insufficient documentation

## 2015-11-12 DIAGNOSIS — C189 Malignant neoplasm of colon, unspecified: Secondary | ICD-10-CM

## 2015-11-12 MED ORDER — MAGNESIUM SULFATE 4 GM/100ML IV SOLN
4.0000 g | Freq: Once | INTRAVENOUS | Status: AC
  Administered 2015-11-12: 4 g via INTRAVENOUS
  Filled 2015-11-12: qty 100

## 2015-11-12 MED ORDER — SODIUM CHLORIDE 0.9 % IV SOLN
INTRAVENOUS | Status: DC
  Administered 2015-11-12: 12:00:00 via INTRAVENOUS

## 2015-11-12 NOTE — Patient Instructions (Signed)
[place] at [facility] Discharge Instructions  RECOMMENDATIONS MADE BY THE CONSULTANT AND ANY TEST RESULTS WILL BE SENT TO YOUR REFERRING PHYSICIAN.  Magnesium today.    Thank you for choosing [place] at [facility] to provide your oncology and hematology care.  To afford each patient quality time with our provider, please arrive at least 15 minutes before your scheduled appointment time.   Beginning January 23rd 2017 lab work for the [name] will be done in the  Main lab at [facility] on 1st floor. If you have a lab appointment with the [facility] please come in thru the  Main Entrance and check in at the main information desk  You need to re-schedule your appointment should you arrive 10 or more minutes late.  We strive to give you quality time with our providers, and arriving late affects you and other patients whose appointments are after yours.  Also, if you no show three or more times for appointments you may be dismissed from the clinic at the providers discretion.     Again, thank you for choosing [facility].  Our hope is that these requests will decrease the amount of time that you wait before being seen by our physicians.       _____________________________________________________________  Should you have questions after your visit to [facility], please contact our office at [phone] between the hours of 8:30 a.m. and 4:30 p.m.  Voicemails left after 4:30 p.m. will not be returned until the following business day.  For prescription refill requests, have your pharmacy contact our office.         Resources For Cancer Patients and their Caregivers ? American Cancer Society: Can assist with transportation, wigs, general needs, runs Look Good Feel Better.        [phone] ? Cancer Care: Provides financial assistance, online support groups, medication/co-pay assistance.   1-800-813-HOPE 7853803747) ? [place] Assists [place] cancer patients and their families through emotional , educational and financial support.  701-352-4644 ? [place] DSS Where to apply for food stamps, Medicaid and utility assistance. 416-040-0355 ? RCATS: Transportation to medical appointments. (385)848-1227 ? Social Security Administration: May apply for disability if have a Stage IV cancer. [phone] 330-2613 7700311154 ? [employer], Disability and Transit Services: Assists with nutrition, care and transit needs. [place] Support Programs: @10RELATIVEDAYS @ > Cancer Support Group  2nd Tuesday of the month 1pm-2pm, [name]  > Creative Journey  3rd Tuesday of the month 1130am-1pm, [name]  > Look Good Feel Better  1st Wednesday of the month 10am-12 noon, [name] (Call [place] to register [phone])

## 2015-11-12 NOTE — Progress Notes (Signed)
Patient tolerated magnesium infusion well.  VSS.  Patient wheeled out of the clinic via wheelchair by daughter and stable at discharge from clinic.

## 2015-11-14 ENCOUNTER — Telehealth (HOSPITAL_COMMUNITY): Payer: Self-pay

## 2015-11-14 DIAGNOSIS — C189 Malignant neoplasm of colon, unspecified: Secondary | ICD-10-CM

## 2015-11-14 DIAGNOSIS — C787 Secondary malignant neoplasm of liver and intrahepatic bile duct: Principal | ICD-10-CM

## 2015-11-14 MED ORDER — DOXYCYCLINE HYCLATE 100 MG PO TABS: 100.0000 mg | ORAL_TABLET | Freq: Two times a day (BID) | ORAL | 0 refills | Status: DC

## 2015-11-14 MED ORDER — HYDROCODONE-ACETAMINOPHEN 7.5-325 MG PO TABS: 1.0000 | ORAL_TABLET | ORAL | 0 refills | Status: DC | PRN

## 2015-11-14 MED ORDER — FENTANYL 25 MCG/HR TD PT72: 25.0000 ug | MEDICATED_PATCH | TRANSDERMAL | 0 refills | Status: DC

## 2015-11-14 NOTE — Telephone Encounter (Signed)
After reviewing with oncologist, instructed patient to continue soaking finger in Epsom salts, keep neosporin on finger and take Doxycycline 1 tab BID x 1 month. Pain medications also prescribed. Patient and family verbalized understanding.

## 2015-11-14 NOTE — Addendum Note (Signed)
Addended by: Jaynie Collins R on: 11/14/2015 10:11 AM   Modules accepted: Orders

## 2015-11-14 NOTE — Telephone Encounter (Signed)
Patient presented to front desk at cancer center requesting to see someone concerning the fact that her toenails and fingernails were cracking and she wants to know if chemo is causing it. Also, wants to know if she needs an antibiotic and refill on pain medication. Brought patient to room 410. Waiting to talk with Dr. Whitney Muse.

## 2015-11-19 ENCOUNTER — Other Ambulatory Visit (HOSPITAL_COMMUNITY): Payer: Self-pay | Admitting: *Deleted

## 2015-11-19 DIAGNOSIS — C189 Malignant neoplasm of colon, unspecified: Secondary | ICD-10-CM

## 2015-11-19 DIAGNOSIS — C787 Secondary malignant neoplasm of liver and intrahepatic bile duct: Principal | ICD-10-CM

## 2015-11-19 DIAGNOSIS — D500 Iron deficiency anemia secondary to blood loss (chronic): Secondary | ICD-10-CM

## 2015-11-23 ENCOUNTER — Encounter (HOSPITAL_BASED_OUTPATIENT_CLINIC_OR_DEPARTMENT_OTHER): Payer: Medicare Other

## 2015-11-23 ENCOUNTER — Inpatient Hospital Stay (HOSPITAL_COMMUNITY): Payer: Medicare Other

## 2015-11-23 ENCOUNTER — Encounter (HOSPITAL_COMMUNITY): Payer: Medicare Other

## 2015-11-23 DIAGNOSIS — C787 Secondary malignant neoplasm of liver and intrahepatic bile duct: Secondary | ICD-10-CM | POA: Diagnosis not present

## 2015-11-23 DIAGNOSIS — D509 Iron deficiency anemia, unspecified: Secondary | ICD-10-CM | POA: Diagnosis not present

## 2015-11-23 DIAGNOSIS — Z5112 Encounter for antineoplastic immunotherapy: Secondary | ICD-10-CM | POA: Diagnosis present

## 2015-11-23 DIAGNOSIS — D500 Iron deficiency anemia secondary to blood loss (chronic): Secondary | ICD-10-CM

## 2015-11-23 DIAGNOSIS — C189 Malignant neoplasm of colon, unspecified: Secondary | ICD-10-CM

## 2015-11-23 LAB — COMPREHENSIVE METABOLIC PANEL
ALBUMIN: 3.5 g/dL (ref 3.5–5.0)
ALK PHOS: 54 U/L (ref 38–126)
ALT: 15 U/L (ref 14–54)
ANION GAP: 5 (ref 5–15)
AST: 22 U/L (ref 15–41)
BUN: 25 mg/dL — ABNORMAL HIGH (ref 6–20)
CALCIUM: 8.4 mg/dL — AB (ref 8.9–10.3)
CO2: 26 mmol/L (ref 22–32)
Chloride: 109 mmol/L (ref 101–111)
Creatinine, Ser: 0.8 mg/dL (ref 0.44–1.00)
GFR calc non Af Amer: >60 mL/min (ref >60)
GLUCOSE: 100 mg/dL — AB (ref 65–99)
POTASSIUM: 3.5 mmol/L (ref 3.5–5.1)
SODIUM: 140 mmol/L (ref 135–145)
TOTAL PROTEIN: 6.4 g/dL — AB (ref 6.5–8.1)
Total Bilirubin: 0.8 mg/dL (ref 0.3–1.2)

## 2015-11-23 LAB — CBC WITH DIFFERENTIAL/PLATELET
BASOS PCT: 0 %
Basophils Absolute: 0 10*3/uL (ref 0.0–0.1)
EOS ABS: 0.2 10*3/uL (ref 0.0–0.7)
EOS PCT: 3 %
HCT: 31 % — ABNORMAL LOW (ref 36.0–46.0)
Hemoglobin: 10.1 g/dL — ABNORMAL LOW (ref 12.0–15.0)
LYMPHS ABS: 1.2 10*3/uL (ref 0.7–4.0)
Lymphocytes Relative: 23 %
MCH: 29.8 pg (ref 26.0–34.0)
MCHC: 32.6 g/dL (ref 30.0–36.0)
MCV: 91.4 fL (ref 78.0–100.0)
MONO ABS: 0.4 10*3/uL (ref 0.1–1.0)
MONOS PCT: 8 %
NEUTROS PCT: 66 %
Neutro Abs: 3.6 10*3/uL (ref 1.7–7.7)
PLATELETS: 174 10*3/uL (ref 150–400)
RBC: 3.39 MIL/uL — ABNORMAL LOW (ref 3.87–5.11)
RDW: 13.8 % (ref 11.5–15.5)
WBC: 5.4 10*3/uL (ref 4.0–10.5)

## 2015-11-23 LAB — MAGNESIUM: Magnesium: 1 mg/dL — ABNORMAL LOW (ref 1.7–2.4)

## 2015-11-23 MED ORDER — SODIUM CHLORIDE 0.9 % IV SOLN
4.9980 mg/kg | Freq: Once | INTRAVENOUS | Status: AC
  Administered 2015-11-23: 340 mg via INTRAVENOUS
  Filled 2015-11-23: qty 17

## 2015-11-23 MED ORDER — SODIUM CHLORIDE 0.9% FLUSH: 10.0000 mL | INTRAVENOUS | Status: DC | PRN

## 2015-11-23 MED ORDER — SODIUM CHLORIDE 0.9 % IV SOLN
Freq: Once | INTRAVENOUS | Status: AC
  Administered 2015-11-23: 12:00:00 via INTRAVENOUS

## 2015-11-23 MED ORDER — MAGNESIUM SULFATE 4 GM/100ML IV SOLN
4.0000 g | Freq: Once | INTRAVENOUS | Status: AC
  Administered 2015-11-23: 4 g via INTRAVENOUS
  Filled 2015-11-23: qty 100

## 2015-11-23 NOTE — Patient Instructions (Signed)
[facility] Discharge Instructions for Patients Receiving Chemotherapy   Beginning January 23rd 2017 lab work for the [facility] will be done in the  Main lab at [facility] on 1st floor. If you have a lab appointment with the [facility] please come in thru the  Main Entrance and check in at the main information desk   Today you received the following chemotherapy agent: Vectibix.     If you develop nausea and vomiting, or diarrhea that is not controlled by your medication, call the clinic.  The clinic phone number is [phone]. Office hours are Monday-Friday 8:30am-5:00pm.  BELOW ARE SYMPTOMS THAT SHOULD BE REPORTED IMMEDIATELY:  *FEVER GREATER THAN 101.0 F  *CHILLS WITH OR WITHOUT FEVER  NAUSEA AND VOMITING THAT IS NOT CONTROLLED WITH YOUR NAUSEA MEDICATION  *UNUSUAL SHORTNESS OF BREATH  *UNUSUAL BRUISING OR BLEEDING  TENDERNESS IN MOUTH AND THROAT WITH OR WITHOUT PRESENCE OF ULCERS  *URINARY PROBLEMS  *BOWEL PROBLEMS  UNUSUAL RASH Items with * indicate a potential emergency and should be followed up as soon as possible. If you have an emergency after office hours please contact your primary care physician or go to the nearest emergency department.  Please call the clinic during office hours if you have any questions or concerns.   You may also contact the Patient Navigator at [phone] should you have any questions or need assistance in obtaining follow up care.      Resources For Cancer Patients and their Caregivers ? American Cancer Society: Can assist with transportation, wigs, general needs, runs Look Good Feel Better.        612-740-3933 ? Cancer Care: Provides financial assistance, online support groups, medication/co-pay assistance.  1-800-813-HOPE 786-660-1608) ? [place] Assists [place] cancer patients and their families through emotional , educational and financial support.   ([phone] ? [place] DSS Where to apply for food stamps, Medicaid and utility assistance. 541-788-6911 ? RCATS: Transportation to medical appointments. 224 325 2008 ? Social Security Administration: May apply for disability if have a Stage IV cancer. [phone] ? [employer], Disability and Transit Services: Assists with nutrition, care and transit needs. [phone]

## 2015-11-23 NOTE — Progress Notes (Signed)
Patient tolerated infusion well.  VSS.  Patient stable and wheeled out by daughter upon discharge from clinic.  Patient reports that she has a sore place on her buttock that started about three days ago.  Robynn Pane, PA aware and ordered home health for the patient to assess and manage area, patient aware and verbalized understanding and agreement.  Daughter aware as well.  Patient educated on the importance of relieving and alternating pressure to the area as well as good protein intake for optimal skin health.  Ensure was given in the clinic to encourage patient to purchase some for daily use.  Patient also c/o continued rash and skin cracks to hands, which has been addressed by the MD and PA previously.  No other complaints or issues and chemotherapy given without difficulty.

## 2015-11-24 LAB — CEA: CEA: 272 ng/mL — AB (ref 0.0–4.7)

## 2015-11-28 ENCOUNTER — Ambulatory Visit (HOSPITAL_COMMUNITY): Payer: Medicare Other

## 2015-11-30 ENCOUNTER — Encounter (HOSPITAL_BASED_OUTPATIENT_CLINIC_OR_DEPARTMENT_OTHER): Payer: Medicare Other | Admitting: Oncology

## 2015-11-30 ENCOUNTER — Encounter (HOSPITAL_COMMUNITY): Payer: Self-pay | Admitting: Oncology

## 2015-11-30 DIAGNOSIS — R970 Elevated carcinoembryonic antigen [CEA]: Secondary | ICD-10-CM | POA: Diagnosis not present

## 2015-11-30 DIAGNOSIS — C787 Secondary malignant neoplasm of liver and intrahepatic bile duct: Secondary | ICD-10-CM

## 2015-11-30 DIAGNOSIS — C189 Malignant neoplasm of colon, unspecified: Secondary | ICD-10-CM | POA: Diagnosis not present

## 2015-11-30 DIAGNOSIS — G8929 Other chronic pain: Secondary | ICD-10-CM

## 2015-11-30 DIAGNOSIS — M25551 Pain in right hip: Secondary | ICD-10-CM | POA: Diagnosis not present

## 2015-11-30 DIAGNOSIS — R0602 Shortness of breath: Secondary | ICD-10-CM

## 2015-11-30 NOTE — Patient Instructions (Signed)
[place] at [facility] Discharge Instructions  RECOMMENDATIONS MADE BY THE CONSULTANT AND ANY TEST RESULTS WILL BE SENT TO YOUR REFERRING PHYSICIAN.  You saw Kirby Crigler, PA-C, today. Treatment every 2 weeks. Follow up with Dr. Whitney Muse in 3-5 weeks.  Thank you for choosing [place] at [facility] to provide your oncology and hematology care.  To afford each patient quality time with our provider, please arrive at least 15 minutes before your scheduled appointment time.   Beginning January 23rd 2017 lab work for the [name] will be done in the  Main lab at [facility] on 1st floor. If you have a lab appointment with the [facility] please come in thru the  Main Entrance and check in at the main information desk  You need to re-schedule your appointment should you arrive 10 or more minutes late.  We strive to give you quality time with our providers, and arriving late affects you and other patients whose appointments are after yours.  Also, if you no show three or more times for appointments you may be dismissed from the clinic at the providers discretion.     Again, thank you for choosing [facility].  Our hope is that these requests will decrease the amount of time that you wait before being seen by our physicians.       _____________________________________________________________  Should you have questions after your visit to [facility], please contact our office at [phone] between the hours of 8:30 a.m. and 4:30 p.m.  Voicemails left after 4:30 p.m. will not be returned until the following business day.  For prescription refill requests, have your pharmacy contact our office.         Resources For Cancer Patients and their Caregivers ? American Cancer Society: Can assist with transportation, wigs, general needs, runs Look Good Feel Better.        249-676-9166 ? Cancer Care: Provides  financial assistance, online support groups, medication/co-pay assistance.  1-800-813-HOPE 385-424-0870) ? [place] Assists [place] cancer patients and their families through emotional , educational and financial support.  732-335-2875 ? [place] DSS Where to apply for food stamps, Medicaid and utility assistance. 206 686 9314 ? RCATS: Transportation to medical appointments. (628)277-9579 ? Social Security Administration: May apply for disability if have a Stage IV cancer. (719)108-9324 6162323503 ? [employer], Disability and Transit Services: Assists with nutrition, care and transit needs. [place] Support Programs: @10RELATIVEDAYS @ > Cancer Support Group  2nd Tuesday of the month 1pm-2pm, [name]  > Creative Journey  3rd Tuesday of the month 1130am-1pm, [name]  > Look Good Feel Better  1st Wednesday of the month 10am-12 noon, [name] (Call [place] to register [phone])

## 2015-11-30 NOTE — Progress Notes (Signed)
Karen Hilding, MD [place] [address]  Colon cancer metastasized to liver [facility])  CURRENT THERAPY: Vectubix every 2 weeks.  INTERVAL HISTORY: Karen [name] 82 y.o. female returns for followup of Stage IV CRC, KRAS wild-type, S/P microwave ablation of a solitary liver lesion on 09/01/2014.  S/P palliative Xeloda 1500 mg BID 7 days on and 7 days off beginning 01/2015 with progression of disease identified in March 2017 resulting in a discontinuation in [place] therapy.  Now on Vectibix every 2 weeks beginning on 04/20/2015 with a dose reduction on 06/15/2015.    Colon cancer metastasized to liver [facility])   04/12/1987 Definitive Surgery    Dr. Lindalou Hose at [facility]      09/01/2014 Pathology Results    Liver, needle/core biopsy - METASTATIC ADENOCARCINOMA.      09/01/2014 Miscellaneous    MWA left liver lesion, [name] (IR)      01/18/2015 Progression    PET scan      01/18/2015 PET scan    Recurrence of  tumor within segment 2 of the liver. There are 2 new lesions identified within segment 5 and segment 6 of the liver worrisome for additional sites of metastasis. Evidence of tumor recurrence at the intra colonic anastomosis is noted...      01/28/2015 - 04/16/2015 Chemotherapy    Xeloda 1500 mg BID 7 days on and 7 days off beginning in December 2016.      04/12/2015 Imaging    CT C/A/P progression of disease, enlargement of metastatic lesion in segments 2/3 of liver, development of mass with colon at junction of descending colon and sigmoid, indeterminate lesion in lower pole of the left kidney      04/20/2015 -  Antibody Plan    Vectibix every 2 weeks, single-agent.      06/12/2015 - 06/13/2015 Hospital Admission    Hypotension, diarrhea, ARF, UTI      06/15/2015 Adverse Reaction    Vectibix rash      06/15/2015 Treatment Plan Change    Vectibix reduced to 5 mg/m2      07/13/2015 Treatment Plan Change    Treatment deferred due to hypomagnesemia  and palpitations.  Escorted to ED.      09/24/2015 Imaging    CT abd/pelvis-  Liver lesions as follows:  Segment 2 left liver lobe hypodense 3.5 x 2.2 cm liver mass, previously 3.1 x 2.0 cm, mildly increased. Medial segment 6 right liver lobe 1.7 x 0.9 cm hypodense liver mass, new. Peripheral segment 6 right liver lobe 1.0 x 0.7 cm hypodense liver mass, new. Serosal 0.7 cm segment 5 right liver lobe mass, previously 0.6 cm, not appreciably changed. Hypodense 0.6 cm liver lesion adjacent to the IVC, previously 0.6 cm, unchanged.      09/24/2015 Imaging    CT chest- New 3 mm right upper lobe pulmonary nodule, indeterminate, pulmonary metastasis not excluded.      11/04/2015 Imaging    CT abd/pelvis- Mechanical small-bowel obstruction at the level of the mid small bowel, probably due to adhesions. No free air. No fluid collections. 2. Liver metastases appear stable. No new or progressive metastatic disease in the abdomen or pelvis.      The majority of our time spent today was listening to the patient discuss recent deaths of family members and friends. I wonder if she is beginning to face her mortality; but based upon other parts of discussion, I fear that she  has not.  She denies any new complaints. She does have chronic right hip pain for which she underwent an injection by orthopod. She notes that this occurred in August and is provided significant pain relief up until the past few days. She will contact this orthopedist for future follow-up appointment in consideration of injection in near future.  She has dryness of her hands and feet with open areas as a result of skin dryness.  Her husband was recently placed in a nursing home at [facility] and she is spending a significant amount of time with him at the nursing facility.  Review of Systems  Constitutional: Negative.  Negative for chills, fever and weight loss.  HENT: Negative.   Eyes: Negative.   Respiratory: Negative.  Negative for  cough.   Cardiovascular: Negative.  Negative for chest pain.  Gastrointestinal: Negative.  Negative for constipation, diarrhea, nausea and vomiting.  Genitourinary: Negative.   Musculoskeletal: Positive for joint pain (right hip).  Skin: Negative.        Dry with cracking.  Neurological: Negative.  Negative for headaches.  Psychiatric/Behavioral: Negative.     Past Medical History:  Diagnosis Date  . Abdominal adhesions   . Achalasia    a. s/p botox injection for achalasia.  . Anemia   . Arthritis   . Asthma    hx of  . Blood transfusion   . Breast lump   . Cancer ([place])    bladder  . Chest pain    a. 2002 Cath: nl cors;  b. 2009 aden mv: nl;  c. 05/2009 Echo: nl. d. 2014: normal nuclear stress test, EF 69%.  . Chronic diastolic CHF (congestive heart failure) (HCC)    a. nl EF by echo 05/2009.  . CKD (chronic kidney disease), stage III    pt. states decreased kidney function  . Colon cancer ([place])    a. recurrence 2016 with liver mets -  percutaneous liver biopsy and thermal ablation of a liver metastasis by interventional radiology..  . Colon polyp   . Depression   . Diabetes mellitus without complication ([place]) 05/24/22   borderline type II; takes no medication for it  . DVT of deep femoral vein ([place])   . Family history of bladder cancer   . Family history of breast cancer   . Family history of colon cancer   . Fibromyalgia   . GERD (gastroesophageal reflux disease)   . Headache   . Hearing loss   . Hernia   . History of blood clots   . History of PSVT (paroxysmal supraventricular tachycardia)   . Hyperlipidemia   . Hypertension   . Ileostomy present ([place])   . Iron deficiency anemia due to chronic blood loss 07/06/2013  . LBBB (left bundle branch block)   . Obstruction of intestine or colon 06/2014  . Sinus bradycardia    a. HR 30s in 08/2014 requiring holding of digoxin.  Marland Kitchen Thyroid disease   . UTI (urinary tract infection)   . Vision abnormalities 2016   not going  blind but having retina problems; might lose ability to see faces  . Wears dentures     Past Surgical History:  Procedure Laterality Date  . ABDOMINAL HYSTERECTOMY    . APPENDECTOMY    . BOTOX INJECTION N/A 09/01/2013   Procedure: BOTOX INJECTION;  Surgeon: Rogene Houston, MD;  Location: [facility];  Service: Endoscopy;  Laterality: N/A;  . BREAST SURGERY     right breast biopsy  .  CARDIAC CATHETERIZATION  12/10/2000   THE LEFT VENTRICLE IS MILDY DILATED. THERE IS MILD TO MODERATE DIFFUSE HYPOKINESIS WITH EF 35%  . CATARACT EXTRACTION W/PHACO  11/13/2011   Procedure: CATARACT EXTRACTION PHACO AND INTRAOCULAR LENS PLACEMENT (IOC);  Surgeon: Tonny Branch, MD;  Location: [facility] ORS;  Service: Ophthalmology;  Laterality: Right;  CDE 12.26  . CATARACT EXTRACTION W/PHACO  12/08/2011   Procedure: CATARACT EXTRACTION PHACO AND INTRAOCULAR LENS PLACEMENT (IOC);  Surgeon: Tonny Branch, MD;  Location: [facility] ORS;  Service: Ophthalmology;  Laterality: Left;  CDE 15.11  . CHOLECYSTECTOMY    . COLON CANCER SURGERY    . COLON SURGERY    . COLONOSCOPY  09/26/2011   Procedure: COLONOSCOPY;  Surgeon: Rogene Houston, MD;  Location: [facility];  Service: Endoscopy;  Laterality: N/A;  215  . CORONARY ANGIOPLASTY    . ESOPHAGOGASTRODUODENOSCOPY  02/13/2011   Procedure: ESOPHAGOGASTRODUODENOSCOPY (EGD);  Surgeon: Rogene Houston, MD;  Location: [facility];  Service: Endoscopy;  Laterality: N/A;  1030  . ESOPHAGOGASTRODUODENOSCOPY N/A 09/01/2013   Procedure: ESOPHAGOGASTRODUODENOSCOPY (EGD);  Surgeon: Rogene Houston, MD;  Location: [facility];  Service: Endoscopy;  Laterality: N/A;  830  . EYE SURGERY  2014   catarac surgery  . ILEOSTOMY    . ILEOSTOMY    . JOINT REPLACEMENT Left 2008  . liver biopsy and ablation  09/01/14  . ovarian cystectomy 1955    . ROTATOR CUFF REPAIR    . TONSILLECTOMY      Family History  Problem Relation Age of Onset  . Heart disease Mother   . Stroke Mother     deceased  .  Arthritis Mother   . Bladder Cancer Mother     dx in her 54s  . Colon cancer Mother   . Arthritis Father   . Heart disease Father     decesaed  . Leukemia Father   . Stroke Sister     alive/debilitated  . Hypertension Sister   . Other Sister     paralysis  . Heart disease Brother     bypass surgery  . Arthritis Brother   . Colon cancer Brother   . Bladder Cancer Brother   . Stroke Sister     alive/debilitated  . Diabetes Sister   . Other Brother     stomach problems  . Other Brother     bladder  . Colon cancer Paternal Aunt   . Bladder Cancer Other     dx twice in her 30s-40s  . Breast cancer Other     great niece dx in her early 15s  . Prostate cancer Other     newphew  . Colon polyps Daughter   . Stroke    . Hypertension      Social History   Social History  . Marital status: Married    Spouse name: N/A  . Number of children: 4  . Years of education: N/A   Social History Main Topics  . Smoking status: Former Smoker    Packs/day: 0.25    Years: 13.00    Types: Cigarettes    Quit date: 02/11/1963  . Smokeless tobacco: Never Used  . Alcohol use No  . Drug use: No  . Sexual activity: Not on file   Other Topics Concern  . Not on file   Social History Narrative  . No narrative on file     PHYSICAL EXAMINATION  ECOG PERFORMANCE STATUS: 1 - Symptomatic but completely ambulatory  Vitals:   11/30/15 1047  BP: (!) 130/48  Pulse: 77  Resp: 18  Temp: 98.9 F (37.2 C)     GENERAL:alert, no distress, comfortable, cooperative and blunted affect, accompanied by her daughter, in wheelchair. SKIN: skin on hand and feet are very dry with cracking of skin with open area. HEAD: Normocephalic, No masses, lesions, tenderness or abnormalities EYES: normal, EOMI, Conjunctiva are pink and non-injected EARS: External ears normal OROPHARYNX:lips, buccal mucosa, and tongue normal and mucous membranes are moist  NECK: supple, trachea midline LYMPH:  no palpable  lymphadenopathy BREAST:not examined LUNGS: clear to auscultation  HEART: regular rate & rhythm, S1 normal and S2 normal.   ABDOMEN:abdomen soft and normal bowel sounds BACK: Back symmetric, no curvature. EXTREMITIES:less then 2 second capillary refill, no joint deformities, effusion, or inflammation, no skin discoloration, no cyanosis  NEURO: alert & oriented x 3 with fluent speech, no focal motor/sensory deficits, gait normal   LABORATORY DATA: CBC    Component Value Date/Time   WBC 5.4 11/23/2015 1102   RBC 3.39 (L) 11/23/2015 1102   HGB 10.1 (L) 11/23/2015 1102   HCT 31.0 (L) 11/23/2015 1102   PLT 174 11/23/2015 1102   MCV 91.4 11/23/2015 1102   MCH 29.8 11/23/2015 1102   MCHC 32.6 11/23/2015 1102   RDW 13.8 11/23/2015 1102   LYMPHSABS 1.2 11/23/2015 1102   MONOABS 0.4 11/23/2015 1102   EOSABS 0.2 11/23/2015 1102   BASOSABS 0.0 11/23/2015 1102      Chemistry      Component Value Date/Time   NA 140 11/23/2015 1102   K 3.5 11/23/2015 1102   CL 109 11/23/2015 1102   CO2 26 11/23/2015 1102   BUN 25 (H) 11/23/2015 1102   CREATININE 0.80 11/23/2015 1102   CREATININE 0.95 (H) 12/07/2014 1512      Component Value Date/Time   CALCIUM 8.4 (L) 11/23/2015 1102   ALKPHOS 54 11/23/2015 1102   AST 22 11/23/2015 1102   ALT 15 11/23/2015 1102   BILITOT 0.8 11/23/2015 1102     Lab Results  Component Value Date   CEA 272.0 (H) 11/23/2015    PENDING LABS:   RADIOGRAPHIC STUDIES:  Ct Abdomen Pelvis W Contrast  Result Date: 11/05/2015 CLINICAL DATA:  82 year old female with a history of colon cancer with recurrence in 2000 62 with liver metastases treated with thermal ablation, now presenting with generalized abdominal pain, nausea and vomiting, with small obstruction on radiographs from earlier today. Recent chemotherapy on 10/26/2015. EXAM: CT ABDOMEN AND PELVIS WITH CONTRAST TECHNIQUE: Multidetector CT imaging of the abdomen and pelvis was performed using the standard  protocol following bolus administration of intravenous contrast. CONTRAST:  70m ISOVUE-300 IOPAMIDOL (ISOVUE-300) INJECTION 61%, 1039mISOVUE-300 IOPAMIDOL (ISOVUE-300) INJECTION 61% COMPARISON:  09/24/2015 CT abdomen/pelvis. FINDINGS: Lower chest: No significant pulmonary nodules or acute consolidative airspace disease. Stable mild cardiomegaly. Trace pericardial effusion/ thickening is not definitely changed. Hepatobiliary: No appreciable change in the liver masses. Lateral segment left liver lobe 3.3 x 2.2 cm hypodense mass (series 2/image 17), previously 3.5 x 2.2 cm, not appreciably changed. Hypodense segment 6 right liver lobe 1.6 x 1.0 cm mass (series 2/ image 23), previously 1.7 x 0.9 cm, not appreciably changed. Separate peripheral segment 6 right liver lobe 1.0 x 0.6 cm mass (series 2/ image 23), previously 1.0 x 0.6 cm, stable. Hypodense 0.7 cm mass at the right liver capsule (series 2/ image 24), previously 0.7 cm, stable. Subcentimeter hypodense 0.6 cm lesion in the medial posterior  right liver lobe is stable. No new liver lesions. Cholecystectomy. No biliary ductal dilatation. Pancreas: Normal, with no mass or duct dilation. Spleen: Normal size. No mass. Adrenals/Urinary Tract: No discrete adrenal nodules. No hydronephrosis. Simple 2.8 cm renal cyst in the interpolar right kidney. Simple 1.1 cm renal cyst in the lower left kidney. Subcentimeter hypodense renal cortical lesion in the lateral interpolar left kidney, too small to characterize, stable, probably a benign renal cyst. No additional renal lesions. Poorly visualized bladder due to streak artifact, with no gross bladder abnormality. Stomach/Bowel: Grossly normal stomach. Status post right hemicolectomy with end ileostomy in the ventral right abdominal wall. There is new mild-to-moderate dilatation of proximal to mid small bowel loops up to the 3.7 cm diameter with associated air-fluid levels. Distal small bowel is collapsed. There is an  apparent focal small bowel caliber transition in the anterior left paramedian abdomen (series 8/ image 49). No small bowel wall thickening, discrete mass or definite pneumatosis. The remnant left colon demonstrates mild sigmoid diverticulosis and nonspecific wall thickening at the junction of the descending and sigmoid colon, which appears stable and may represent postsurgical change. No new pericolonic fat stranding or new large bowel wall thickening. Stable prominent diastasis of the right ventral abdominal wall. Vascular/Lymphatic: Atherosclerotic nonaneurysmal abdominal aorta. Patent portal, splenic, hepatic and renal veins. No pathologically enlarged lymph nodes in the abdomen or pelvis. Reproductive: Status post hysterectomy, with no abnormal findings at the vaginal cuff. No adnexal mass. Other: No pneumoperitoneum, ascites or focal fluid collection. Musculoskeletal: No aggressive appearing focal osseous lesions. Partially visualized left total hip arthroplasty. Mild thoracolumbar spondylosis. IMPRESSION: 1. Mechanical small-bowel obstruction at the level of the mid small bowel, probably due to adhesions. No free air. No fluid collections. 2. Liver metastases appear stable. No new or progressive metastatic disease in the abdomen or pelvis. 3. Additional findings include stable mild cardiomegaly, trace pericardial effusion/thickening, aortic atherosclerosis and mild sigmoid diverticulosis. Electronically Signed   By: Ilona Sorrel M.D.   On: 11/05/2015 12:03   Dg Chest Portable 1 View  Result Date: 11/05/2015 CLINICAL DATA:  Nasogastric tube placement. EXAM: PORTABLE CHEST 1 VIEW COMPARISON:  Radiographs 11/05/2015.  CT 09/24/2015. FINDINGS: Two views are submitted. The tip of the nasogastric tube projects below the diaphragm to the level of the mid stomach. Side hole is near the gastric cardia. There is stable cardiac enlargement. The mediastinal contours are stable. The lungs are clear. There is no pleural  effusion or pneumothorax. IMPRESSION: Nasogastric tube extends into the mid stomach. Electronically Signed   By: Richardean Sale M.D.   On: 11/05/2015 13:54   Dg Abd Acute W/chest  Result Date: 11/05/2015 CLINICAL DATA:  Abdominal pain with nausea and vomiting this morning. Stage IV colon cancer. History is small bowel obstruction. EXAM: DG ABDOMEN ACUTE W/ 1V CHEST COMPARISON:  CT scans of the abdomen and chest dated 09/24/2015 and chest x-ray dated 06/12/2015 FINDINGS: There is a dilated loop of small bowel in the left lower quadrant. No free air or free fluid. The lungs are clear. No acute bone abnormality. Moderate arthritis of the right hip. Left total hip prosthesis. Aortic atherosclerosis. IMPRESSION: Findings consistent with focal small-bowel obstruction in the left lower quadrant. Aortic atherosclerosis. Electronically Signed   By: Lorriane Shire M.D.   On: 11/05/2015 08:33     PATHOLOGY:    ASSESSMENT AND PLAN:  Colon cancer metastasized to liver ([place]) Stage IV CRC, KRAS wild-type, S/P microwave ablation of a solitary liver lesion on  09/01/2014.  S/P palliative Xeloda 1500 mg BID 7 days on and 7 days off beginning 01/2015 with progression of disease identified in March 2017 resulting in a discontinuation in [place] therapy.  Now on Vectibix every 2 weeks beginning on 04/20/2015 with a dose reduction on 06/15/2015.  Oncology history updated.  Pre-treatment labs next week: CBC diff, CMET, Mg, CEA.  I personally reviewed and went over laboratory results with the patient.  The results are noted within this dictation.  CEA continues to climb concerning for progressive disease.  I personally reviewed and went over radiographic studies with the patient.  The results are noted within this dictation.  CT scan reviewed from the ED on 11/05/2015.  This demonstrated a small bowel obstruction and stable hepatic lesions.  Given her recent SBO and climbing CEA, she is educated that these are typically  indications of impending failure of treatment.  Despite this, she wishes to continue.  Goals of care are yet again addresses and her goal is to "get better."  Again, she is educated that her disease is incurable.  "But God can cure it."  Code Status has been discussed on multiple occassions and she remains very unrealistic regarding goals of care.  "I want to live as long as I can."  She wants to remain a FULL CODE.  "The good Lord will take me when he is ready, even if I'm on a respiratory."   Ongoing hand moisturizing and neosporin/triple antibiotic ointment is recommended for her hands and feet.   Return as scheduled for ongoing treatment.  Return in 3 or 5 weeks for follow-up.   ORDERS PLACED FOR THIS ENCOUNTER: No orders of the defined types were placed in this encounter.   MEDICATIONS PRESCRIBED THIS ENCOUNTER: No orders of the defined types were placed in this encounter.   THERAPY PLAN:  Will continue with treatment as planned.  She remains in denial of her prognosis.  All questions were answered. The patient knows to call the clinic with any problems, questions or concerns. We can certainly see the patient much sooner if necessary.  Patient and plan discussed with Dr. Ancil Linsey and she is in agreement with the aforementioned.   This note is electronically signed by: Doy Mince 11/30/2015 11:42 AM

## 2015-11-30 NOTE — Assessment & Plan Note (Addendum)
Stage IV CRC, KRAS wild-type, S/P microwave ablation of a solitary liver lesion on 09/01/2014.  S/P palliative Xeloda 1500 mg BID 7 days on and 7 days off beginning 01/2015 with progression of disease identified in March 2017 resulting in a discontinuation in [place] therapy.  Now on Vectibix every 2 weeks beginning on 04/20/2015 with a dose reduction on 06/15/2015.  Oncology history updated.  Pre-treatment labs next week: CBC diff, CMET, Mg, CEA.  I personally reviewed and went over laboratory results with the patient.  The results are noted within this dictation.  CEA continues to climb concerning for progressive disease.  I personally reviewed and went over radiographic studies with the patient.  The results are noted within this dictation.  CT scan reviewed from the ED on 11/05/2015.  This demonstrated a small bowel obstruction and stable hepatic lesions.  Given her recent SBO and climbing CEA, she is educated that these are typically indications of impending failure of treatment.  Despite this, she wishes to continue.  Goals of care are yet again addresses and her goal is to "get better."  Again, she is educated that her disease is incurable.  "But God can cure it."  Code Status has been discussed on multiple occassions and she remains very unrealistic regarding goals of care.  "I want to live as long as I can."  She wants to remain a FULL CODE.  "The good Lord will take me when he is ready, even if I'm on a respiratory."   Ongoing hand moisturizing and neosporin/triple antibiotic ointment is recommended for her hands and feet.   Return as scheduled for ongoing treatment.  Return in 3 or 5 weeks for follow-up.

## 2015-12-07 ENCOUNTER — Encounter (HOSPITAL_COMMUNITY): Payer: Medicare Other

## 2015-12-07 ENCOUNTER — Encounter (HOSPITAL_COMMUNITY): Payer: Self-pay

## 2015-12-07 ENCOUNTER — Encounter (HOSPITAL_BASED_OUTPATIENT_CLINIC_OR_DEPARTMENT_OTHER): Payer: Medicare Other

## 2015-12-07 DIAGNOSIS — C189 Malignant neoplasm of colon, unspecified: Secondary | ICD-10-CM | POA: Diagnosis not present

## 2015-12-07 DIAGNOSIS — D509 Iron deficiency anemia, unspecified: Secondary | ICD-10-CM | POA: Diagnosis not present

## 2015-12-07 DIAGNOSIS — C787 Secondary malignant neoplasm of liver and intrahepatic bile duct: Secondary | ICD-10-CM

## 2015-12-07 DIAGNOSIS — Z5112 Encounter for antineoplastic immunotherapy: Secondary | ICD-10-CM | POA: Diagnosis not present

## 2015-12-07 LAB — MAGNESIUM: MAGNESIUM: 0.9 mg/dL — AB (ref 1.7–2.4)

## 2015-12-07 MED ORDER — SODIUM CHLORIDE 0.9% FLUSH
10.0000 mL | INTRAVENOUS | Status: DC | PRN
  Administered 2015-12-07: 3 mL
  Filled 2015-12-07: qty 10

## 2015-12-07 MED ORDER — MAGNESIUM SULFATE 4 GM/100ML IV SOLN
4.0000 g | Freq: Once | INTRAVENOUS | Status: AC
  Administered 2015-12-07: 4 g via INTRAVENOUS
  Filled 2015-12-07: qty 100

## 2015-12-07 MED ORDER — SODIUM CHLORIDE 0.9 % IV SOLN
Freq: Once | INTRAVENOUS | Status: AC
Start: 2015-12-07 — End: 2015-12-07
  Administered 2015-12-07: 11:00:00 via INTRAVENOUS

## 2015-12-07 MED ORDER — SODIUM CHLORIDE 0.9 % IV SOLN
4.9980 mg/kg | Freq: Once | INTRAVENOUS | Status: AC
  Administered 2015-12-07: 340 mg via INTRAVENOUS
  Filled 2015-12-07: qty 17

## 2015-12-07 NOTE — Patient Instructions (Signed)
[facility] Discharge Instructions for Patients Receiving Chemotherapy   Beginning January 23rd 2017 lab work for the [facility] will be done in the  Main lab at [facility] on 1st floor. If you have a lab appointment with the [place] [facility] please come in thru the  Main Entrance and check in at the main information desk   Today you received the following chemotherapy agents Vectibix, magnesium  To help prevent nausea and vomiting after your treatment, we encourage you to take your nausea medication     If you develop nausea and vomiting, or diarrhea that is not controlled by your medication, call the clinic.  The clinic phone number is [phone]. Office hours are Monday-Friday 8:30am-5:00pm.  BELOW ARE SYMPTOMS THAT SHOULD BE REPORTED IMMEDIATELY:  *FEVER GREATER THAN 101.0 F  *CHILLS WITH OR WITHOUT FEVER  NAUSEA AND VOMITING THAT IS NOT CONTROLLED WITH YOUR NAUSEA MEDICATION  *UNUSUAL SHORTNESS OF BREATH  *UNUSUAL BRUISING OR BLEEDING  TENDERNESS IN MOUTH AND THROAT WITH OR WITHOUT PRESENCE OF ULCERS  *URINARY PROBLEMS  *BOWEL PROBLEMS  UNUSUAL RASH Items with * indicate a potential emergency and should be followed up as soon as possible. If you have an emergency after office hours please contact your primary care physician or go to the nearest emergency department.  Please call the clinic during office hours if you have any questions or concerns.   You may also contact the Patient Navigator at [phone] should you have any questions or need assistance in obtaining follow up care.      Resources For Cancer Patients and their Caregivers ? American Cancer Society: Can assist with transportation, wigs, general needs, runs Look Good Feel Better.        (989) 650-7339 ? Cancer Care: Provides financial assistance, online support groups, medication/co-pay assistance.  1-800-813-HOPE 817-692-8657) ? [facility]  [place] cancer patients and their families through emotional , educational and financial support.  825-017-4735 ? [place] DSS Where to apply for food stamps, Medicaid and utility assistance. 902 171 0056 ? [facility]: Transportation to medical appointments. 631-724-9909 ? Social Security Administration: May apply for disability if have a Stage IV cancer. 463-351-0498 337-104-0347 ? [employer], Disability and Transit Services: Assists with nutrition, care and transit needs. [phone]

## 2015-12-07 NOTE — Progress Notes (Signed)
Magnesium and Vectibix given today per orders. Patient tolerated it well, no problems. Vitals stable, discharged via wheelchair with daughter from clinic.

## 2015-12-07 NOTE — Progress Notes (Signed)
CRITICAL VALUE ALERT Critical value received:  Magnesium 0.9 Date of notification:  12/07/15  Time of notification: 0955 Critical value read back:  Yes.   Nurse who received alert:  T.Kael Keetch,RN MD notified (1st page):  1000

## 2015-12-09 LAB — CEA: CEA: 303.8 ng/mL — ABNORMAL HIGH (ref 0.0–4.7)

## 2015-12-11 ENCOUNTER — Ambulatory Visit (INDEPENDENT_AMBULATORY_CARE_PROVIDER_SITE_OTHER): Payer: Medicare Other | Admitting: Urology

## 2015-12-11 DIAGNOSIS — D3002 Benign neoplasm of left kidney: Secondary | ICD-10-CM

## 2015-12-11 DIAGNOSIS — C678 Malignant neoplasm of overlapping sites of bladder: Secondary | ICD-10-CM

## 2015-12-17 ENCOUNTER — Other Ambulatory Visit (HOSPITAL_COMMUNITY): Payer: Self-pay

## 2015-12-17 DIAGNOSIS — N183 Chronic kidney disease, stage 3 (moderate): Secondary | ICD-10-CM | POA: Diagnosis not present

## 2015-12-17 DIAGNOSIS — D509 Iron deficiency anemia, unspecified: Secondary | ICD-10-CM | POA: Diagnosis not present

## 2015-12-17 DIAGNOSIS — L89321 Pressure ulcer of left buttock, stage 1: Secondary | ICD-10-CM | POA: Diagnosis not present

## 2015-12-17 DIAGNOSIS — R790 Abnormal level of blood mineral: Secondary | ICD-10-CM

## 2015-12-17 DIAGNOSIS — C189 Malignant neoplasm of colon, unspecified: Secondary | ICD-10-CM | POA: Diagnosis not present

## 2015-12-17 DIAGNOSIS — E039 Hypothyroidism, unspecified: Secondary | ICD-10-CM | POA: Diagnosis not present

## 2015-12-17 DIAGNOSIS — C787 Secondary malignant neoplasm of liver and intrahepatic bile duct: Secondary | ICD-10-CM | POA: Diagnosis not present

## 2015-12-17 DIAGNOSIS — I129 Hypertensive chronic kidney disease with stage 1 through stage 4 chronic kidney disease, or unspecified chronic kidney disease: Secondary | ICD-10-CM | POA: Diagnosis not present

## 2015-12-17 DIAGNOSIS — Z932 Ileostomy status: Secondary | ICD-10-CM | POA: Diagnosis not present

## 2015-12-17 MED ORDER — MAGNESIUM OXIDE 400 (241.3 MG) MG PO TABS: 1.0000 | ORAL_TABLET | Freq: Four times a day (QID) | ORAL | 1 refills | Status: DC

## 2015-12-20 DIAGNOSIS — D509 Iron deficiency anemia, unspecified: Secondary | ICD-10-CM | POA: Diagnosis not present

## 2015-12-20 DIAGNOSIS — L89321 Pressure ulcer of left buttock, stage 1: Secondary | ICD-10-CM | POA: Diagnosis not present

## 2015-12-20 DIAGNOSIS — C787 Secondary malignant neoplasm of liver and intrahepatic bile duct: Secondary | ICD-10-CM | POA: Diagnosis not present

## 2015-12-20 DIAGNOSIS — C189 Malignant neoplasm of colon, unspecified: Secondary | ICD-10-CM | POA: Diagnosis not present

## 2015-12-20 DIAGNOSIS — E039 Hypothyroidism, unspecified: Secondary | ICD-10-CM | POA: Diagnosis not present

## 2015-12-20 DIAGNOSIS — I129 Hypertensive chronic kidney disease with stage 1 through stage 4 chronic kidney disease, or unspecified chronic kidney disease: Secondary | ICD-10-CM | POA: Diagnosis not present

## 2015-12-21 ENCOUNTER — Encounter (HOSPITAL_COMMUNITY): Payer: Medicare Other | Attending: Hematology & Oncology

## 2015-12-21 ENCOUNTER — Encounter (HOSPITAL_COMMUNITY): Payer: Medicare Other

## 2015-12-21 VITALS — BP 116/49 | HR 64 | Temp 97.7°F | Resp 18 | Wt 133.4 lb

## 2015-12-21 DIAGNOSIS — C189 Malignant neoplasm of colon, unspecified: Secondary | ICD-10-CM

## 2015-12-21 DIAGNOSIS — Z5112 Encounter for antineoplastic immunotherapy: Secondary | ICD-10-CM | POA: Diagnosis not present

## 2015-12-21 DIAGNOSIS — C787 Secondary malignant neoplasm of liver and intrahepatic bile duct: Principal | ICD-10-CM

## 2015-12-21 LAB — COMPREHENSIVE METABOLIC PANEL
ALT: 17 U/L (ref 14–54)
AST: 29 U/L (ref 15–41)
Albumin: 3.4 g/dL — ABNORMAL LOW (ref 3.5–5.0)
Alkaline Phosphatase: 49 U/L (ref 38–126)
Anion gap: 6 (ref 5–15)
BUN: 17 mg/dL (ref 6–20)
CO2: 25 mmol/L (ref 22–32)
Calcium: 8.6 mg/dL — ABNORMAL LOW (ref 8.9–10.3)
Chloride: 109 mmol/L (ref 101–111)
Creatinine, Ser: 0.81 mg/dL (ref 0.44–1.00)
GFR calc Af Amer: >60 mL/min (ref >60)
GFR calc non Af Amer: >60 mL/min (ref >60)
Glucose, Bld: 103 mg/dL — ABNORMAL HIGH (ref 65–99)
Potassium: 4.4 mmol/L (ref 3.5–5.1)
Sodium: 140 mmol/L (ref 135–145)
Total Bilirubin: 0.5 mg/dL (ref 0.3–1.2)
Total Protein: 6.3 g/dL — ABNORMAL LOW (ref 6.5–8.1)

## 2015-12-21 LAB — CBC WITH DIFFERENTIAL/PLATELET
Basophils Absolute: 0 10*3/uL (ref 0.0–0.1)
Basophils Relative: 0 %
Eosinophils Absolute: 0.1 10*3/uL (ref 0.0–0.7)
Eosinophils Relative: 2 %
HCT: 33 % — ABNORMAL LOW (ref 36.0–46.0)
Hemoglobin: 10.6 g/dL — ABNORMAL LOW (ref 12.0–15.0)
Lymphocytes Relative: 24 %
Lymphs Abs: 1.1 10*3/uL (ref 0.7–4.0)
MCH: 29.7 pg (ref 26.0–34.0)
MCHC: 32.1 g/dL (ref 30.0–36.0)
MCV: 92.4 fL (ref 78.0–100.0)
Monocytes Absolute: 0.3 10*3/uL (ref 0.1–1.0)
Monocytes Relative: 6 %
Neutro Abs: 3.1 10*3/uL (ref 1.7–7.7)
Neutrophils Relative %: 68 %
Platelets: 170 10*3/uL (ref 150–400)
RBC: 3.57 MIL/uL — ABNORMAL LOW (ref 3.87–5.11)
RDW: 13.2 % (ref 11.5–15.5)
WBC: 4.7 10*3/uL (ref 4.0–10.5)

## 2015-12-21 LAB — MAGNESIUM: MAGNESIUM: 0.9 mg/dL — AB (ref 1.7–2.4)

## 2015-12-21 MED ORDER — SODIUM CHLORIDE 0.9 % IV SOLN
Freq: Once | INTRAVENOUS | Status: AC
  Administered 2015-12-21: 12:00:00 via INTRAVENOUS

## 2015-12-21 MED ORDER — MAGNESIUM SULFATE 4 GM/100ML IV SOLN
4.0000 g | Freq: Once | INTRAVENOUS | Status: AC
  Administered 2015-12-21: 4 g via INTRAVENOUS
  Filled 2015-12-21: qty 100

## 2015-12-21 MED ORDER — PANITUMUMAB CHEMO INJECTION 100 MG/5ML
4.9980 mg/kg | Freq: Once | INTRAVENOUS | Status: AC
  Administered 2015-12-21: 340 mg via INTRAVENOUS
  Filled 2015-12-21: qty 17

## 2015-12-21 MED ORDER — SODIUM CHLORIDE 0.9% FLUSH
10.0000 mL | INTRAVENOUS | Status: DC | PRN
  Administered 2015-12-21: 10 mL
  Filled 2015-12-21: qty 10

## 2015-12-21 NOTE — Progress Notes (Signed)
Tolerated vectibix and magnesium infusions well. Stable on discharge home with daughter via wheelchair.

## 2015-12-21 NOTE — Progress Notes (Signed)
CRITICAL VALUE ALERT  Critical value received:  Magnesium  Date of notification:  12/21/15  Time of notification:  [id number]  Critical value read back:Yes.    Nurse who received alert:  Cleta Alberts RN  MD notified (1st page):  Yes  Time of first page:  1200  MD notified (2nd page):verbal given to Dr Whitney Muse  Time of second page:  Responding MD: Whitney Muse Time MD responded:

## 2015-12-21 NOTE — Patient Instructions (Signed)
[facility] Discharge Instructions for Patients Receiving Chemotherapy   Beginning January 23rd 2017 lab work for the [facility] will be done in the  Main lab at [facility] on 1st floor. If you have a lab appointment with the [place] please come in thru the  Main Entrance and check in at the main information desk   Today you received the following chemotherapy agents Vectibix. You also received magnesium 4 gm IV infusion. You will need magnesium infusion Monday and Wednesday next week.  If you develop nausea and vomiting, or diarrhea that is not controlled by your medication, call the clinic.  The clinic phone number is [phone]. Office hours are Monday-Friday 8:30am-5:00pm.  BELOW ARE SYMPTOMS THAT SHOULD BE REPORTED IMMEDIATELY:  *FEVER GREATER THAN 101.0 F  *CHILLS WITH OR WITHOUT FEVER  NAUSEA AND VOMITING THAT IS NOT CONTROLLED WITH YOUR NAUSEA MEDICATION  *UNUSUAL SHORTNESS OF BREATH  *UNUSUAL BRUISING OR BLEEDING  TENDERNESS IN MOUTH AND THROAT WITH OR WITHOUT PRESENCE OF ULCERS  *URINARY PROBLEMS  *BOWEL PROBLEMS  UNUSUAL RASH Items with * indicate a potential emergency and should be followed up as soon as possible. If you have an emergency after office hours please contact your primary care physician or go to the nearest emergency department.  Please call the clinic during office hours if you have any questions or concerns.   You may also contact the Patient Navigator at [phone] should you have any questions or need assistance in obtaining follow up care.      Resources For Cancer Patients and their Caregivers ? American Cancer Society: Can assist with transportation, wigs, general needs, runs Look Good Feel Better.        424-491-9249 ? Cancer Care: Provides financial assistance, online support groups, medication/co-pay assistance.  1-800-813-HOPE 336-708-1292) ? [place] Assists [place]  cancer patients and their families through emotional , educational and financial support.  279-509-5444 ? [place] DSS Where to apply for food stamps, Medicaid and utility assistance. (360)052-4282 ? RCATS: Transportation to medical appointments. 220-607-6338 ? Social Security Administration: May apply for disability if have a Stage IV cancer. [phone] ? [employer], Disability and Transit Services: Assists with nutrition, care and transit needs. [phone]

## 2015-12-24 ENCOUNTER — Encounter (HOSPITAL_BASED_OUTPATIENT_CLINIC_OR_DEPARTMENT_OTHER): Payer: Medicare Other

## 2015-12-24 ENCOUNTER — Ambulatory Visit (HOSPITAL_COMMUNITY): Payer: Medicare Other

## 2015-12-24 MED ORDER — SODIUM CHLORIDE 0.9 % IV SOLN
INTRAVENOUS | Status: DC
  Administered 2015-12-24: 10:00:00 via INTRAVENOUS

## 2015-12-24 MED ORDER — MAGNESIUM SULFATE 4 GM/100ML IV SOLN
4.0000 g | Freq: Once | INTRAVENOUS | Status: AC
  Administered 2015-12-24: 4 g via INTRAVENOUS
  Filled 2015-12-24: qty 100

## 2015-12-24 NOTE — Progress Notes (Signed)
Loyola H Friedlander tolerated Magnesium infusion well without complaints or incident. VSS upon discharge. Pt discharged via wheelchair in satisfactory condition with daughter

## 2015-12-24 NOTE — Patient Instructions (Signed)
[place] at [facility] Discharge Instructions  RECOMMENDATIONS MADE BY THE CONSULTANT AND ANY TEST RESULTS WILL BE SENT TO YOUR REFERRING PHYSICIAN.  Received Magnesium infusion today. Follow-up as scheduled. Call clinic for any questions or concerns  Thank you for choosing [place] at [facility] to provide your oncology and hematology care.  To afford each patient quality time with our provider, please arrive at least 15 minutes before your scheduled appointment time.   Beginning January 23rd 2017 lab work for the [name] will be done in the  Main lab at [facility] on 1st floor. If you have a lab appointment with the [place] please come in thru the  Main Entrance and check in at the main information desk  You need to re-schedule your appointment should you arrive 10 or more minutes late.  We strive to give you quality time with our providers, and arriving late affects you and other patients whose appointments are after yours.  Also, if you no show three or more times for appointments you may be dismissed from the clinic at the providers discretion.     Again, thank you for choosing [facility].  Our hope is that these requests will decrease the amount of time that you wait before being seen by our physicians.       _____________________________________________________________  Should you have questions after your visit to [facility], please contact our office at [phone] between the hours of 8:30 a.m. and 4:30 p.m.  Voicemails left after 4:30 p.m. will not be returned until the following business day.  For prescription refill requests, have your pharmacy contact our office.         Resources For Cancer Patients and their Caregivers ? American Cancer Society: Can assist with transportation, wigs, general needs, runs Look Good Feel Better.        [phone] ? Cancer Care: Provides  financial assistance, online support groups, medication/co-pay assistance.  1-800-813-HOPE 228-230-6810) ? [place] Assists [place] cancer patients and their families through emotional , educational and financial support.  ([phone] ? [place] DSS Where to apply for food stamps, Medicaid and utility assistance. 501-229-0090 ? RCATS: Transportation to medical appointments. 248-778-0301 ? Social Security Administration: May apply for disability if have a Stage IV cancer. [phone] 587-150-3183 ? [employer], Disability and Transit Services: Assists with nutrition, care and transit needs. [place] Support Programs: @10RELATIVEDAYS @ > Cancer Support Group  2nd Tuesday of the month 1pm-2pm, [name]  > Creative Journey  3rd Tuesday of the month 1130am-1pm, [name]  > Look Good Feel Better  1st Wednesday of the month 10am-12 noon, [name] (Call [place] to register [phone])

## 2015-12-25 DIAGNOSIS — L89321 Pressure ulcer of left buttock, stage 1: Secondary | ICD-10-CM | POA: Diagnosis not present

## 2015-12-25 DIAGNOSIS — C787 Secondary malignant neoplasm of liver and intrahepatic bile duct: Secondary | ICD-10-CM | POA: Diagnosis not present

## 2015-12-25 DIAGNOSIS — I129 Hypertensive chronic kidney disease with stage 1 through stage 4 chronic kidney disease, or unspecified chronic kidney disease: Secondary | ICD-10-CM | POA: Diagnosis not present

## 2015-12-25 DIAGNOSIS — E039 Hypothyroidism, unspecified: Secondary | ICD-10-CM | POA: Diagnosis not present

## 2015-12-25 DIAGNOSIS — C189 Malignant neoplasm of colon, unspecified: Secondary | ICD-10-CM | POA: Diagnosis not present

## 2015-12-25 DIAGNOSIS — D509 Iron deficiency anemia, unspecified: Secondary | ICD-10-CM | POA: Diagnosis not present

## 2015-12-26 ENCOUNTER — Encounter (HOSPITAL_BASED_OUTPATIENT_CLINIC_OR_DEPARTMENT_OTHER): Payer: Medicare Other

## 2015-12-26 MED ORDER — MAGNESIUM SULFATE 4 GM/100ML IV SOLN
4.0000 g | Freq: Once | INTRAVENOUS | Status: AC
  Administered 2015-12-26: 4 g via INTRAVENOUS
  Filled 2015-12-26: qty 100

## 2015-12-26 MED ORDER — SODIUM CHLORIDE 0.9 % IV SOLN
INTRAVENOUS | Status: DC
  Administered 2015-12-26: 14:00:00 via INTRAVENOUS

## 2015-12-26 NOTE — Patient Instructions (Signed)
[place] at [facility] Discharge Instructions  RECOMMENDATIONS MADE BY THE CONSULTANT AND ANY TEST RESULTS WILL BE SENT TO YOUR REFERRING PHYSICIAN.  Received Magnesium infusion today. Follow-up as scheduled. Call clinic for any questions or concerns  Thank you for choosing [place] at [facility] to provide your oncology and hematology care.  To afford each patient quality time with our provider, please arrive at least 15 minutes before your scheduled appointment time.   Beginning January 23rd 2017 lab work for the [name] will be done in the  Main lab at [facility] on 1st floor. If you have a lab appointment with the [place] please come in thru the  Main Entrance and check in at the main information desk  You need to re-schedule your appointment should you arrive 10 or more minutes late.  We strive to give you quality time with our providers, and arriving late affects you and other patients whose appointments are after yours.  Also, if you no show three or more times for appointments you may be dismissed from the clinic at the providers discretion.     Again, thank you for choosing [facility].  Our hope is that these requests will decrease the amount of time that you wait before being seen by our physicians.       _____________________________________________________________  Should you have questions after your visit to [facility], please contact our office at [phone] between the hours of 8:30 a.m. and 4:30 p.m.  Voicemails left after 4:30 p.m. will not be returned until the following business day.  For prescription refill requests, have your pharmacy contact our office.         Resources For Cancer Patients and their Caregivers ? American Cancer Society: Can assist with transportation, wigs, general needs, runs Look Good Feel Better.        ([phone] ? Cancer Care: Provides  financial assistance, online support groups, medication/co-pay assistance.  1-800-813-HOPE 520-789-4716) ? [place] Assists [place] cancer patients and their families through emotional , educational and financial support.  7045689234 ? [place] DSS Where to apply for food stamps, Medicaid and utility assistance. 249-311-6124 ? RCATS: Transportation to medical appointments. 3367477401 ? Social Security Administration: May apply for disability if have a Stage IV cancer. 3342734939 [phone] ? [employer], Disability and Transit Services: Assists with nutrition, care and transit needs. [place] Support Programs: @10RELATIVEDAYS @ > Cancer Support Group  2nd Tuesday of the month 1pm-2pm, [name]  > Creative Journey  3rd Tuesday of the month 1130am-1pm, [name]  > Look Good Feel Better  1st Wednesday of the month 10am-12 noon, [name] (Call [place] to register [phone])

## 2015-12-26 NOTE — Progress Notes (Signed)
Karen [name] tolerated Magnesium infusion well without complaints or incident. VSS upon discharge. Pt discharged via wheelchair in satisfactory condition with daughter

## 2015-12-27 ENCOUNTER — Encounter (HOSPITAL_BASED_OUTPATIENT_CLINIC_OR_DEPARTMENT_OTHER): Payer: Medicare Other | Admitting: Hematology & Oncology

## 2015-12-27 ENCOUNTER — Encounter (HOSPITAL_COMMUNITY): Payer: Self-pay | Admitting: Hematology & Oncology

## 2015-12-27 DIAGNOSIS — R970 Elevated carcinoembryonic antigen [CEA]: Secondary | ICD-10-CM

## 2015-12-27 DIAGNOSIS — I129 Hypertensive chronic kidney disease with stage 1 through stage 4 chronic kidney disease, or unspecified chronic kidney disease: Secondary | ICD-10-CM | POA: Diagnosis not present

## 2015-12-27 DIAGNOSIS — L89321 Pressure ulcer of left buttock, stage 1: Secondary | ICD-10-CM | POA: Diagnosis not present

## 2015-12-27 DIAGNOSIS — C189 Malignant neoplasm of colon, unspecified: Secondary | ICD-10-CM | POA: Diagnosis not present

## 2015-12-27 DIAGNOSIS — T451X5A Adverse effect of antineoplastic and immunosuppressive drugs, initial encounter: Secondary | ICD-10-CM

## 2015-12-27 DIAGNOSIS — R234 Changes in skin texture: Secondary | ICD-10-CM

## 2015-12-27 DIAGNOSIS — C787 Secondary malignant neoplasm of liver and intrahepatic bile duct: Secondary | ICD-10-CM | POA: Diagnosis not present

## 2015-12-27 DIAGNOSIS — Z7189 Other specified counseling: Secondary | ICD-10-CM

## 2015-12-27 DIAGNOSIS — G893 Neoplasm related pain (acute) (chronic): Secondary | ICD-10-CM

## 2015-12-27 DIAGNOSIS — D509 Iron deficiency anemia, unspecified: Secondary | ICD-10-CM | POA: Diagnosis not present

## 2015-12-27 DIAGNOSIS — E039 Hypothyroidism, unspecified: Secondary | ICD-10-CM | POA: Diagnosis not present

## 2015-12-27 MED ORDER — FENTANYL 50 MCG/HR TD PT72: 50.0000 ug | MEDICATED_PATCH | TRANSDERMAL | 0 refills | Status: DC

## 2015-12-27 MED ORDER — HYDROCODONE-ACETAMINOPHEN 7.5-325 MG PO TABS: 1.0000 | ORAL_TABLET | ORAL | 0 refills | Status: DC | PRN

## 2015-12-27 NOTE — Patient Instructions (Addendum)
[place] at [facility] Discharge Instructions  RECOMMENDATIONS MADE BY THE CONSULTANT AND ANY TEST RESULTS WILL BE SENT TO YOUR REFERRING PHYSICIAN.  Exam with Dr.  Muse today.   We are scheduling you for scans.  Please see [name] as you leave today for scan appointment date and time. We will see you after the scans are complete to review results. You will also return as scheduled for treatment.    Thank you for choosing [place] at [facility] to provide your oncology and hematology care.  To afford each patient quality time with our provider, please arrive at least 15 minutes before your scheduled appointment time.   Beginning January 23rd 2017 lab work for the [name] will be done in the  Main lab at [facility] on 1st floor. If you have a lab appointment with the [place] please come in thru the  Main Entrance and check in at the main information desk  You need to re-schedule your appointment should you arrive 10 or more minutes late.  We strive to give you quality time with our providers, and arriving late affects you and other patients whose appointments are after yours.  Also, if you no show three or more times for appointments you may be dismissed from the clinic at the providers discretion.     Again, thank you for choosing [facility].  Our hope is that these requests will decrease the amount of time that you wait before being seen by our physicians.       _____________________________________________________________  Should you have questions after your visit to [facility], please contact our office at [phone]) [phone] between the hours of 8:30 a.m. and 4:30 p.m.  Voicemails left after 4:30 p.m. will not be returned until the following business day.  For prescription refill requests, have your pharmacy contact our office.         Resources For Cancer Patients and their Caregivers ? American  Cancer Society: Can assist with transportation, wigs, general needs, runs Look Good Feel Better.        [phone] ? Cancer Care: Provides financial assistance, online support groups, medication/co-pay assistance.  1-800-813-HOPE 858-003-2428) ? [place] Assists [place] cancer patients and their families through emotional , educational and financial support.  ([phone] ? [place] DSS Where to apply for food stamps, Medicaid and utility assistance. 720-110-5452 ? RCATS: Transportation to medical appointments. 506-869-7080 ? Social Security Administration: May apply for disability if have a Stage IV cancer. [phone] 705-503-1274 ? [employer], Disability and Transit Services: Assists with nutrition, care and transit needs. [place] Support Programs: @10RELATIVEDAYS @ > Cancer Support Group  2nd Tuesday of the month 1pm-2pm, [name]  > Creative Journey  3rd Tuesday of the month 1130am-1pm, [name]  > Look Good Feel Better  1st Wednesday of the month 10am-12 noon, [name] (Call [place] to register [phone])

## 2015-12-27 NOTE — Progress Notes (Signed)
[facility] [place], MD [place] / [place] [address]   DIAGNOSIS: Colon carcinoma with permanent ostomy Diversion Colitis Achalasia Iron deficiency, anemia secondary to GI related blood loss Hemeoccult positive stools Rising CEA with CT imaging on 07/13/2014 1.3 cm mass in L hepatic lobe Subcapsular mass in anterior pole of L kidney suspicious for RCC Cutaneous microwave ablation of her now biopsy-proven metastatic colonic adenocarcinoma to the left liver 09/01/2014   Colon cancer metastasized to liver [facility])   04/12/1987 Definitive Surgery    Dr. Lindalou Hose at [facility]      09/01/2014 Pathology Results    Liver, needle/core biopsy - METASTATIC ADENOCARCINOMA.      09/01/2014 Miscellaneous    MWA left liver lesion, [name] (IR)      01/18/2015 Progression    PET scan      01/18/2015 PET scan    Recurrence of  tumor within segment 2 of the liver. There are 2 new lesions identified within segment 5 and segment 6 of the liver worrisome for additional sites of metastasis. Evidence of tumor recurrence at the intra colonic anastomosis is noted...      01/28/2015 - 04/16/2015 Chemotherapy    Xeloda 1500 mg BID 7 days on and 7 days off beginning in December 2016.      04/12/2015 Imaging    CT C/A/P progression of disease, enlargement of metastatic lesion in segments 2/3 of liver, development of mass with colon at junction of descending colon and sigmoid, indeterminate lesion in lower pole of the left kidney      04/20/2015 -  Antibody Plan    Vectibix every 2 weeks, single-agent.      06/12/2015 - 06/13/2015 Hospital Admission    Hypotension, diarrhea, ARF, UTI      06/15/2015 Adverse Reaction    Vectibix rash      06/15/2015 Treatment Plan Change    Vectibix reduced to 5 mg/m2      07/13/2015 Treatment Plan Change    Treatment deferred due to hypomagnesemia and palpitations.  Escorted to ED.      09/24/2015 Imaging    CT abd/pelvis-  Liver lesions as follows:  Segment 2 left liver lobe hypodense 3.5 x 2.2 cm liver mass, previously 3.1 x 2.0 cm, mildly increased. Medial segment 6 right liver lobe 1.7 x 0.9 cm hypodense liver mass, new. Peripheral segment 6 right liver lobe 1.0 x 0.7 cm hypodense liver mass, new. Serosal 0.7 cm segment 5 right liver lobe mass, previously 0.6 cm, not appreciably changed. Hypodense 0.6 cm liver lesion adjacent to the IVC, previously 0.6 cm, unchanged.      09/24/2015 Imaging    CT chest- New 3 mm right upper lobe pulmonary nodule, indeterminate, pulmonary metastasis not excluded.      CURRENT THERAPY:  Vectibix IV iron prn IV magnesium  History of Present Illness: Karen [name] returns today for further follow-up of stage IV CRC s/p microwave ablation of a solitary liver lesion. Unfortunately repeat imaging in December revealed other liver disease and recurrence at the prior anastomotic site.  She also has a L renal lesion that is under observation. She has other health issues including chronic LBP. She has progressed through [place]. She is currently on Vectibix.  Ms. Langill is accompanied by her daughter. She will receive cycle #19 of Panitumumab on 11/24.  Patient reports skin changes in abdomen that resembles "measles". She also has nose  sores that bleed daily. Her eyes and ears are also sore.She generally dislikes the skin and hair changes associated with treatment. Zenobia states she experiences LBP and hip pain that is not alleviated with medication. Symptoms often keep her awake at night.   Patient needs a refill on hydrocodone. She states that she takes her magnesium daily and as prescribed.   She has already had her flu shot.  MEDICAL HISTORY: Past Medical History:  Diagnosis Date  . Abdominal adhesions   . Achalasia    a. s/p botox injection for achalasia.  . Anemia   . Arthritis   . Asthma    hx of  . Blood transfusion   . Breast lump   .  Cancer (HCC)    bladder  . Chest pain    a. 2002 Cath: nl cors;  b. 2009 aden mv: nl;  c. 05/2009 Echo: nl. d. 2014: normal nuclear stress test, EF 69%.  . Chronic diastolic CHF (congestive heart failure) (HCC)    a. nl EF by echo 05/2009.  . CKD (chronic kidney disease), stage III    pt. states decreased kidney function  . Colon cancer (HCC)    a. recurrence 2016 with liver mets -  percutaneous liver biopsy and thermal ablation of a liver metastasis by interventional radiology..  . Colon polyp   . Depression   . Diabetes mellitus without complication (HCC) 08/10/14   borderline type II; takes no medication for it  . DVT of deep femoral vein (HCC)   . Family history of bladder cancer   . Family history of breast cancer   . Family history of colon cancer   . Fibromyalgia   . GERD (gastroesophageal reflux disease)   . Headache   . Hearing loss   . Hernia   . History of blood clots   . History of PSVT (paroxysmal supraventricular tachycardia)   . Hyperlipidemia   . Hypertension   . Ileostomy present (HCC)   . Iron deficiency anemia due to chronic blood loss 07/06/2013  . LBBB (left bundle branch block)   . Obstruction of intestine or colon (HCC) 06/2014  . Sinus bradycardia    a. HR 30s in 08/2014 requiring holding of digoxin.  Marland Kitchen Thyroid disease   . UTI (urinary tract infection)   . Vision abnormalities 2016   not going blind but having retina problems; might lose ability to see faces  . Wears dentures     has Left bundle branch block; Chest pain, exertional; Benign hypertensive heart disease without heart failure; Hypothyroid; Small bowel obstruction, partial (HCC); Dysphagia; Paroxysmal SVT (supraventricular tachycardia) (HCC); Ileostomy status (HCC); Unstable angina (HCC); Parastomal hernia; Iron deficiency anemia due to chronic blood loss; Malabsorption of iron; Acute abdominal pain; Acute renal failure (HCC); Hyperglycemia; UTI (urinary tract infection); Small bowel obstruction  (HCC); Abdominal pain, acute; Liver mass, left lobe; Colon cancer metastasized to liver [facility]); Liver lesion; Sinus bradycardia; Family history of colon cancer; Family history of bladder cancer; Family history of breast cancer; Genetic testing; Counseling regarding end of life decision making; Neoplasm related pain; AKI (acute kidney injury) (HCC); Urinary tract infection; Hypotension; Diarrhea; Elevated troponin; CKD (chronic kidney disease) stage 3, GFR 30-59 ml/min; Hypomagnesemia; SBO (small bowel obstruction) (HCC); GERD (gastroesophageal reflux disease); and HTN (hypertension) on her problem list.     is allergic to bactrim [sulfamethoxazole-trimethoprim]; codeine; demerol; lidocaine hcl; morphine and related; dilaudid [hydromorphone hcl]; nitrofurantoin monohyd macro; lisinopril; and ramipril.   Iron Infusion in March 2016.  Mammogram performed in 2015. Reported multiple hernias.   SURGICAL HISTORY: Past Surgical History:  Procedure Laterality Date  . ABDOMINAL HYSTERECTOMY    . APPENDECTOMY    . BOTOX INJECTION N/A 09/01/2013   Procedure: BOTOX INJECTION;  Surgeon: Rogene Houston, MD;  Location: [facility];  Service: Endoscopy;  Laterality: N/A;  . BREAST SURGERY     right breast biopsy  . CARDIAC CATHETERIZATION  12/10/2000   THE LEFT VENTRICLE IS MILDY DILATED. THERE IS MILD TO MODERATE DIFFUSE HYPOKINESIS WITH EF 35%  . CATARACT EXTRACTION W/PHACO  11/13/2011   Procedure: CATARACT EXTRACTION PHACO AND INTRAOCULAR LENS PLACEMENT (IOC);  Surgeon: Tonny Branch, MD;  Location: [facility] ORS;  Service: Ophthalmology;  Laterality: Right;  CDE 12.26  . CATARACT EXTRACTION W/PHACO  12/08/2011   Procedure: CATARACT EXTRACTION PHACO AND INTRAOCULAR LENS PLACEMENT (IOC);  Surgeon: Tonny Branch, MD;  Location: [facility] ORS;  Service: Ophthalmology;  Laterality: Left;  CDE 15.11  . CHOLECYSTECTOMY    . COLON CANCER SURGERY    . COLON SURGERY    . COLONOSCOPY  09/26/2011   Procedure: COLONOSCOPY;  Surgeon:  Rogene Houston, MD;  Location: [facility];  Service: Endoscopy;  Laterality: N/A;  215  . CORONARY ANGIOPLASTY    . ESOPHAGOGASTRODUODENOSCOPY  02/13/2011   Procedure: ESOPHAGOGASTRODUODENOSCOPY (EGD);  Surgeon: Rogene Houston, MD;  Location: [facility];  Service: Endoscopy;  Laterality: N/A;  1030  . ESOPHAGOGASTRODUODENOSCOPY N/A 09/01/2013   Procedure: ESOPHAGOGASTRODUODENOSCOPY (EGD);  Surgeon: Rogene Houston, MD;  Location: [facility];  Service: Endoscopy;  Laterality: N/A;  830  . EYE SURGERY  2014   catarac surgery  . ILEOSTOMY    . ILEOSTOMY    . JOINT REPLACEMENT Left 2008  . liver biopsy and ablation  09/01/14  . ovarian cystectomy 1955    . ROTATOR CUFF REPAIR    . TONSILLECTOMY      SOCIAL HISTORY: Social History   Social History  . Marital status: Married    Spouse name: N/A  . Number of children: 4  . Years of education: N/A   Occupational History  . Not on file.   Social History Main Topics  . Smoking status: Former Smoker    Packs/day: 0.25    Years: 13.00    Types: Cigarettes    Quit date: 02/11/1963  . Smokeless tobacco: Never Used  . Alcohol use No  . Drug use: No  . Sexual activity: Not on file   Other Topics Concern  . Not on file   Social History Narrative  . No narrative on file   Reads inspirational books Has 72 Great-Grandchildren and 37 Grandchildren  Father died 5 Mother died 43 Oldest brother died 35  FAMILY HISTORY: Family History  Problem Relation Age of Onset  . Heart disease Mother   . Stroke Mother     deceased  . Arthritis Mother   . Bladder Cancer Mother     dx in her 61s  . Colon cancer Mother   . Arthritis Father   . Heart disease Father     decesaed  . Leukemia Father   . Stroke Sister     alive/debilitated  . Hypertension Sister   . Other Sister     paralysis  . Heart disease Brother     bypass surgery  . Arthritis Brother   . Colon cancer Brother   . Bladder Cancer Brother   . Stroke Sister  alive/debilitated  . Diabetes Sister   . Other Brother     stomach problems  . Other Brother     bladder  . Colon cancer Paternal Aunt   . Bladder Cancer Other     dx twice in her 30s-40s  . Breast cancer Other     great niece dx in her early 76s  . Prostate cancer Other     newphew  . Colon polyps Daughter   . Stroke    . Hypertension      Review of Systems  Constitutional: Negative for fever, chills, weight loss and malaise/fatigue.  HENT: Positive for hearing loss and epistaxis. Negative for congestion, nosebleeds, sore throat and tinnitus.   Sore right ear lesion that intermittently bleeds. Eyes: Positive for sore eyes. Negative for blurred vision, double vision, pain and discharge.  Sore eyes due to irritation from eyelashes Respiratory: Negative for cough, hemoptysis, sputum production, shortness of breath and wheezing.   Cardiovascular: Negative for palpitations, claudication, leg swelling and PND.  Gastrointestinal: Negative for nausea, heartburn, abdominal pain, diarrhea, constipation, blood in stool and melena.  Genitourinary: Negative for dysuria, urgency, frequency and hematuria.  Musculoskeletal: Positive for joint pain. Negative for myalgias and falls.  Skin: Positive for rash on abdomen and nail changes. EGFR therapy related changes Neurological: Positive for weakness. Negative for dizziness, tingling, tremors, sensory change, speech change, focal weakness, seizures, loss of consciousness and headaches.  Endo/Heme/Allergies: Does not bruise/bleed easily.  Psychiatric/Behavioral: Positive for insomnia. Negative for suicidal ideas, memory loss and substance abuse. The patient is not nervous/anxious. Depression related to her husband's declining health  14 point review of systems was performed and is negative except as detailed under history of present illness and above   PHYSICAL EXAMINATION  Vitals with BMI 12/27/2015  Height   Weight 133 lbs 13 oz  BMI     Systolic 267  Diastolic 60  Pulse 66  Respirations 16  ECOG PERFORMANCE STATUS: 2 - Symptomatic, <50% confined to bed   Physical Exam  Constitutional: She is oriented to person, place, and time and well-developed, well-nourished, and in no distress. HENT:  Head: Normocephalic and atraumatic. R ear with small deformity from prior surgery, area with eschar, suspicious Nose: Nose normal.  Mouth/Throat: Oropharynx is clear and moist. No oropharyngeal exudate.  Eyes: Conjunctivae and EOM are normal. Pupils are equal, round, and reactive to light. Right eye exhibits no discharge. Left eye exhibits no discharge. No scleral icterus.  Neck: Normal range of motion. Neck supple. No tracheal deviation present. No thyromegaly present.  Cardiovascular: Normal rate, regular rhythm and normal heart sounds.  Exam reveals no gallop and no friction rub.   No murmur heard. Pulmonary/Chest: Effort normal and breath sounds normal. She has no wheezes. She has no rales.  Abdominal: Soft. Bowel sounds are normal. . There is no tenderness. There is no rebound and no guarding. ostomy site is intact with watery dark stool. Hernia noted. Incision sites all WNL Musculoskeletal: Normal range of motion. She exhibits no edema.  Lymphadenopathy:    She has no cervical adenopathy.  Neurological: She is alert and oriented to person, place, and time. She has normal reflexes. No cranial nerve deficit.  Skin: Skin is warm and dry. Mild vectibix facial and abdominal rash. Mild associated nail changes/periungual splitting (grade 1) eyelashes are long, thickened Psychiatric: Mood, memory, affect and judgment normal.  Nursing note and vitals reviewed.   LABORATORY DATA: Results for NOELI, LAVERY (MRN [id number]) as of 12/27/2015  10:18  Ref. Range 12/21/2015 11:05  Sodium Latest Ref Range: 135 - 145 mmol/L 140  Potassium Latest Ref Range: 3.5 - 5.1 mmol/L 4.4  Chloride Latest Ref Range: 101 - 111 mmol/L 109  CO2 Latest Ref  Range: 22 - 32 mmol/L 25  BUN Latest Ref Range: 6 - 20 mg/dL 17  Creatinine Latest Ref Range: 0.44 - 1.00 mg/dL 0.81  Calcium Latest Ref Range: 8.9 - 10.3 mg/dL 8.6 (L)  EGFR (Non-African Amer.) Latest Ref Range: >60 mL/min >60  EGFR (African American) Latest Ref Range: >60 mL/min >60  Glucose Latest Ref Range: 65 - 99 mg/dL 103 (H)  Anion gap Latest Ref Range: 5 - 15  6  Alkaline Phosphatase Latest Ref Range: 38 - 126 U/L 49  Albumin Latest Ref Range: 3.5 - 5.0 g/dL 3.4 (L)  AST Latest Ref Range: 15 - 41 U/L 29  ALT Latest Ref Range: 14 - 54 U/L 17  Total Protein Latest Ref Range: 6.5 - 8.1 g/dL 6.3 (L)  Total Bilirubin Latest Ref Range: 0.3 - 1.2 mg/dL 0.5  WBC Latest Ref Range: 4.0 - 10.5 K/uL 4.7  RBC Latest Ref Range: 3.87 - 5.11 MIL/uL 3.57 (L)  Hemoglobin Latest Ref Range: 12.0 - 15.0 g/dL 10.6 (L)  HCT Latest Ref Range: 36.0 - 46.0 % 33.0 (L)  MCV Latest Ref Range: 78.0 - 100.0 fL 92.4  MCH Latest Ref Range: 26.0 - 34.0 pg 29.7  MCHC Latest Ref Range: 30.0 - 36.0 g/dL 32.1  RDW Latest Ref Range: 11.5 - 15.5 % 13.2  Platelets Latest Ref Range: 150 - 400 K/uL 170  Neutrophils Latest Units: % 68  Lymphocytes Latest Units: % 24  Monocytes Relative Latest Units: % 6  Eosinophil Latest Units: % 2  Basophil Latest Units: % 0  NEUT# Latest Ref Range: 1.7 - 7.7 K/uL 3.1  Lymphocyte # Latest Ref Range: 0.7 - 4.0 K/uL 1.1  Monocyte # Latest Ref Range: 0.1 - 1.0 K/uL 0.3  Eosinophils Absolute Latest Ref Range: 0.0 - 0.7 K/uL 0.1  Basophils Absolute Latest Ref Range: 0.0 - 0.1 K/uL 0.0    Results for GRETTEL, RAMES (MRN [id number]) as of 12/27/2015 10:18  Ref. Range 11/23/2015 11:02 12/07/2015 09:31 12/07/2015 11:00 12/21/2015 11:01 12/21/2015 11:05  CEA Latest Ref Range: 0.0 - 4.7 ng/mL 272.0 (H)  303.8 (H)     Results for JAID, QUIRION (MRN [id number]) as of 12/27/2015 10:18  Ref. Range 11/23/2015 11:02 12/07/2015 09:31 12/07/2015 11:00 12/21/2015 11:01 12/21/2015 11:05    Magnesium Latest Ref Range: 1.7 - 2.4 mg/dL 1.0 (L) 0.9 (LL)  0.9 (LL)     RADIOLOGY: I have reviewed the images detailed below and agree with the results:  Study Result   CLINICAL DATA:  82 year old female with a history of colon cancer with recurrence in 2000 62 with liver metastases treated with thermal ablation, now presenting with generalized abdominal pain, nausea and vomiting, with small obstruction on radiographs from earlier today. Recent chemotherapy on 10/26/2015.  EXAM: CT ABDOMEN AND PELVIS WITH CONTRAST  TECHNIQUE: Multidetector CT imaging of the abdomen and pelvis was performed using the standard protocol following bolus administration of intravenous contrast.  CONTRAST:  23m ISOVUE-300 IOPAMIDOL (ISOVUE-300) INJECTION 61%, 1055mISOVUE-300 IOPAMIDOL (ISOVUE-300) INJECTION 61%  COMPARISON:  09/24/2015 CT abdomen/pelvis.  FINDINGS: Lower chest: No significant pulmonary nodules or acute consolidative airspace disease. Stable mild cardiomegaly. Trace pericardial effusion/ thickening is not definitely changed.  Hepatobiliary: No appreciable change in the liver masses.  Lateral segment left liver lobe 3.3 x 2.2 cm hypodense mass (series 2/image 17), previously 3.5 x 2.2 cm, not appreciably changed. Hypodense segment 6 right liver lobe 1.6 x 1.0 cm mass (series 2/ image 23), previously 1.7 x 0.9 cm, not appreciably changed. Separate peripheral segment 6 right liver lobe 1.0 x 0.6 cm mass (series 2/ image 23), previously 1.0 x 0.6 cm, stable. Hypodense 0.7 cm mass at the right liver capsule (series 2/ image 24), previously 0.7 cm, stable. Subcentimeter hypodense 0.6 cm lesion in the medial posterior right liver lobe is stable. No new liver lesions. Cholecystectomy. No biliary ductal dilatation.  Pancreas: Normal, with no mass or duct dilation.  Spleen: Normal size. No mass.  Adrenals/Urinary Tract: No discrete adrenal nodules. No hydronephrosis.  Simple 2.8 cm renal cyst in the interpolar right kidney. Simple 1.1 cm renal cyst in the lower left kidney. Subcentimeter hypodense renal cortical lesion in the lateral interpolar left kidney, too small to characterize, stable, probably a benign renal cyst. No additional renal lesions. Poorly visualized bladder due to streak artifact, with no gross bladder abnormality.  Stomach/Bowel: Grossly normal stomach. Status post right hemicolectomy with end ileostomy in the ventral right abdominal wall. There is new mild-to-moderate dilatation of proximal to mid small bowel loops up to the 3.7 cm diameter with associated air-fluid levels. Distal small bowel is collapsed. There is an apparent focal small bowel caliber transition in the anterior left paramedian abdomen (series 8/ image 49). No small bowel wall thickening, discrete mass or definite pneumatosis. The remnant left colon demonstrates mild sigmoid diverticulosis and nonspecific wall thickening at the junction of the descending and sigmoid colon, which appears stable and may represent postsurgical change. No new pericolonic fat stranding or new large bowel wall thickening. Stable prominent diastasis of the right ventral abdominal wall.  Vascular/Lymphatic: Atherosclerotic nonaneurysmal abdominal aorta. Patent portal, splenic, hepatic and renal veins. No pathologically enlarged lymph nodes in the abdomen or pelvis.  Reproductive: Status post hysterectomy, with no abnormal findings at the vaginal cuff. No adnexal mass.  Other: No pneumoperitoneum, ascites or focal fluid collection.  Musculoskeletal: No aggressive appearing focal osseous lesions. Partially visualized left total hip arthroplasty. Mild thoracolumbar spondylosis.  IMPRESSION: 1. Mechanical small-bowel obstruction at the level of the mid small bowel, probably due to adhesions. No free air. No fluid collections. 2. Liver metastases appear stable. No new or  progressive metastatic disease in the abdomen or pelvis. 3. Additional findings include stable mild cardiomegaly, trace pericardial effusion/thickening, aortic atherosclerosis and mild sigmoid diverticulosis.   Electronically Signed   By: Ilona Sorrel M.D.   On: 11/05/2015 12:03    Addendum   ADDENDUM REPORT: 09/28/2015 08:09  ADDENDUM: Liver lesions as follows:  Segment 2 left liver lobe hypodense 3.5 x 2.2 cm liver mass (series 2/image 50), previously 3.1 x 2.0 cm, mildly increased.  Medial segment 6 right liver lobe 1.7 x 0.9 cm hypodense liver mass (series 2/image 56), new.  Peripheral segment 6 right liver lobe 1.0 x 0.7 cm hypodense liver mass (series 2/image 50), new.  Serosal 0.7 cm segment 5 right liver lobe mass (series 2/image 59), previously 0.6 cm, not appreciably changed.  Hypodense 0.6 cm liver lesion adjacent to the IVC, previously 0.6 cm, unchanged.  The original report is otherwise unchanged.   Electronically Signed   By: Ilona Sorrel M.D.   On: 09/28/2015 08:09   Addended by Sharyn Blitz, MD on 09/28/2015 8:12 AM    Study Result  CLINICAL DATA:  Re- stage stage IV colorectal cancer.  EXAM: CT CHEST, ABDOMEN, AND PELVIS WITH CONTRAST  TECHNIQUE: Multidetector CT imaging of the chest, abdomen and pelvis was performed following the standard protocol during bolus administration of intravenous contrast.  CONTRAST:  171m ISOVUE-300 IOPAMIDOL (ISOVUE-300) INJECTION 61%  COMPARISON:  04/12/2015 CT chest, abdomen and pelvis.  FINDINGS: CT CHEST FINDINGS  Mediastinum/Nodes: Stable mild cardiomegaly. No significant pericardial fluid/thickening. Coronary atherosclerosis . Atherosclerotic nonaneurysmal thoracic aorta. Normal caliber pulmonary arteries. No central pulmonary emboli. No discrete thyroid nodules. Stable mild circumferential wall thickening in the lower third of the thoracic esophagus. No pathologically  enlarged axillary, mediastinal or hilar lymph nodes. Stable 0.6 cm right pericardiophrenic lymph node, top-normal.  Lungs/Pleura: No pneumothorax. No pleural effusion. New subpleural right upper lobe 3 mm pulmonary nodule (series 6/ image 45). Stable parenchymal band in the basilar right lower lobe. Stable 3 mm subpleural posterior left lower lobe pulmonary nodule (series 6/ image 104). No acute consolidative airspace disease or additional significant pulmonary nodules.  Musculoskeletal: No aggressive appearing focal osseous lesions. Moderate mid thoracic spondylosis.  CT ABDOMEN PELVIS FINDINGS  Hepatobiliary: Segment 2 left liver lobe heterogeneous hypodense liver mass (series 2/ image 50), previously 3.1 x 2.0 cm, mildly increased. Hypodense 0.6 cm segment 6 liver lesion (series 2/ image 56), too small to characterize, unchanged. There are 2 new ill-defined hypodense lesions in the posterior right liver lobe, largest 1.7 x 0.9 cm (series 2/ image 56). There is a 0.7 cm nodule at the right liver surface (series 2/ image 59), previously 0.6 cm, not definitely changed. Cholecystectomy. No biliary ductal dilatation.  Pancreas: Normal, with no mass or duct dilation.  Spleen: Normal size. No mass.  Adrenals/Urinary Tract: Normal adrenals. Simple 2.3 cm upper right renal cyst. Hypodense 1.1 cm anterior lower left renal lesion (series 2/ image 71), not appreciably changed in size back to 06/14/2012, suggesting a benign renal cyst. Subcentimeter hypodense renal cortical lesion in the interpolar left kidney is too small to characterize and is stable. No hydronephrosis. Bladder is poorly visualized due to streak artifact from the left hip hardware with no gross bladder abnormality.  Stomach/Bowel: Grossly normal stomach. Status post right hemicolectomy with right ventral abdominal wall end ileostomy. Stable prominent diastasis of the right ventral abdominal wall at the  ileostomy site. No small bowel dilatation or wall thickening. Oral contrast progresses to the end ileostomy. There is a 3.3 x 3.1 cm colonic mass at the junction of the descending and sigmoid colon (series 2/image 80), previously 3.3 x 2.9 cm using similar measurement technique, not appreciably changed. Mild sigmoid diverticulosis.  Vascular/Lymphatic: Atherosclerotic nonaneurysmal abdominal aorta. Patent portal, splenic, hepatic and renal veins. No pathologically enlarged lymph nodes in the abdomen or pelvis.  Reproductive: Status post hysterectomy, with no abnormal findings at the vaginal cuff. No adnexal mass.  Other: No pneumoperitoneum, ascites or focal fluid collection.  Musculoskeletal: No aggressive appearing focal osseous lesions. Small sclerotic L3 and L5 vertebral body lesions are unchanged since 2011, consistent with benign bone islands. Mild lumbar spondylosis.  IMPRESSION: 1. Left liver lobe metastasis is mildly increased in size. Two new ill-defined low-attenuation posterior right liver lobe lesions, cannot exclude new liver metastases. 2. Stable subcentimeter nodule at the right liver capsule, which could represent a liver metastasis or peritoneal tumor implant. 3. Stable 3.3 cm colonic mass at the junction of the sigmoid and descending colon, which could represent a serosal peritoneal tumor implant versus metachronous primary colonic neoplasm. 4. New  3 mm right upper lobe pulmonary nodule, indeterminate, pulmonary metastasis not excluded. Recommend attention on follow-up chest CT in 3-6 months. 5. Additional findings include aortic atherosclerosis, coronary atherosclerosis, stable nonspecific wall thickening in the lower third of the thoracic esophagus and mild sigmoid diverticulosis.  Electronically Signed: By: Ilona Sorrel M.D. On: 09/24/2015 11:47       ASSESSMENT and THERAPY PLAN:  Colon carcinoma with permanent ostomy Diversion  Colitis Achalasia Iron deficiency, anemia secondary to GI related blood loss Hemeoccult positive stools Rising CEA with CT imaging on 07/13/2014 1.3 cm mass in L hepatic lobe Subcapsular mass in anterior pole of L kidney suspicious for RCC Biopsy of L liver lesion and MWA Left liver lesion 09/01/2014 Taste Alteration KRAS WT XELODA therapy, week on, week off started 01/2015 Progressive disease Difficulty discussing end of life decision making [facility] admission for diarrhea ? Vectibix induced Hypomagnesemia secondary to Vectibix therapy  Her CEA is rising and we discussed repeating imaging. We again reviewed side effects of the multiple therapy options for stage IV CRC. She would not do well with FOLFOX, FOLFIRI, I also do not believe she would tolerate single agent irinotecan well. .  Will continue to replace magnesium IV as needed.  I have discussed discontinuing therapy many times with the patient and she is reluctant to do so. She feels a great need to continue to care for her husband. She states that she is taking her oral magnesium.   We discussed end of life issues again, she remains resistant. I unfortunately do not think she will ever come to terms with end of life issues or DNR status.  We will repeat CT scan after Thanksgiving.  We again reviewed the appropriate management of skin/hair/nail changes from EGFR directed therapy.   I have increased patient's fentanyl patch.   [place] will RTC post CT scans to discuss results and for ongoing recommendations.  Orders Placed This Encounter  Procedures  . CT Abdomen Pelvis W Contrast    Standing Status:   Future    Number of Occurrences:   1    Standing Expiration Date:   12/26/2016    Scheduling Instructions:     Please give patient water soluble contrast    Order Specific Question:   If indicated for the ordered procedure, I authorize the administration of contrast media per Radiology protocol    Answer:   Yes     Order Specific Question:   Reason for Exam (SYMPTOM  OR DIAGNOSIS REQUIRED)    Answer:   restaging stage IV CRC    Order Specific Question:   Preferred imaging location?    Answer:   [facility]  . CT Chest W Contrast    Standing Status:   Future    Number of Occurrences:   1    Standing Expiration Date:   12/26/2016    Order Specific Question:   If indicated for the ordered procedure, I authorize the administration of contrast media per Radiology protocol    Answer:   Yes    Order Specific Question:   Reason for Exam (SYMPTOM  OR DIAGNOSIS REQUIRED)    Answer:   restaging stage IV crc    Order Specific Question:   Preferred imaging location?    Answer:   [facility]  . CBC with Differential    Standing Status:   Future    Number of Occurrences:   1    Standing Expiration Date:  12/26/2016  . Comprehensive metabolic panel    Standing Status:   Future    Number of Occurrences:   1    Standing Expiration Date:   12/26/2016   Meds ordered this encounter  Medications  . DISCONTD: HYDROcodone-acetaminophen (NORCO) 7.5-325 MG tablet    Sig: Take 1 tablet by mouth every 4 (four) hours as needed.    Dispense:  100 tablet    Refill:  0  . DISCONTD: fentaNYL (DURAGESIC - DOSED MCG/HR) 50 MCG/HR    Sig: Place 1 patch (50 mcg total) onto the skin every 3 (three) days.    Dispense:  10 patch    Refill:  0     All questions were answered. The patient knows to call the clinic with any problems, questions or concerns. We can certainly see the patient much sooner if necessary.  This document serves as a record of services personally performed by Ancil Linsey, MD. It was created on her behalf by Elmyra Ricks, a trained medical scribe. The creation of this record is based on the scribe's personal observations and the provider's statements to them. This document has been checked and approved by the attending provider.  I have reviewed the above documentation for accuracy and  completeness, and I agree with the above.  This document was electronically signed.   Kelby Fam. Whitney Muse, MD

## 2015-12-28 ENCOUNTER — Other Ambulatory Visit (HOSPITAL_COMMUNITY): Payer: Self-pay

## 2015-12-28 ENCOUNTER — Telehealth (HOSPITAL_COMMUNITY): Payer: Self-pay

## 2015-12-28 DIAGNOSIS — C787 Secondary malignant neoplasm of liver and intrahepatic bile duct: Principal | ICD-10-CM

## 2015-12-28 DIAGNOSIS — C189 Malignant neoplasm of colon, unspecified: Secondary | ICD-10-CM

## 2015-12-28 MED ORDER — FENTANYL 25 MCG/HR TD PT72: 25.0000 ug | MEDICATED_PATCH | TRANSDERMAL | 0 refills | Status: DC

## 2015-12-28 MED ORDER — FENTANYL 12 MCG/HR TD PT72: 12.0000 ug | MEDICATED_PATCH | TRANSDERMAL | 0 refills | Status: DC

## 2015-12-28 NOTE — Telephone Encounter (Signed)
-----   Message from Louis Meckel sent at 12/28/2015 10:47 AM EST ----- Regarding: pain med Patient called and needs to talk to a nurse about med pain.  Call her on her cell [phone]

## 2015-12-28 NOTE — Progress Notes (Unsigned)
fent

## 2015-12-28 NOTE — Telephone Encounter (Signed)
Patient was called to discuss her pain medication.  She stated that she was unable to tolerate the increase to 50mcg on her Fentanyl patch, but that with her current 75mcg dose she was still having uncontrolled pain.  Addressed with Dr.  Muse and order for 44mcg patch refilled as well as 6mcg patch for patient to wear for a total of 21mcg.  Patient to pick up prescription Monday.

## 2016-01-01 DIAGNOSIS — L89321 Pressure ulcer of left buttock, stage 1: Secondary | ICD-10-CM | POA: Diagnosis not present

## 2016-01-01 DIAGNOSIS — E039 Hypothyroidism, unspecified: Secondary | ICD-10-CM | POA: Diagnosis not present

## 2016-01-01 DIAGNOSIS — C189 Malignant neoplasm of colon, unspecified: Secondary | ICD-10-CM | POA: Diagnosis not present

## 2016-01-01 DIAGNOSIS — D509 Iron deficiency anemia, unspecified: Secondary | ICD-10-CM | POA: Diagnosis not present

## 2016-01-01 DIAGNOSIS — I129 Hypertensive chronic kidney disease with stage 1 through stage 4 chronic kidney disease, or unspecified chronic kidney disease: Secondary | ICD-10-CM | POA: Diagnosis not present

## 2016-01-01 DIAGNOSIS — C787 Secondary malignant neoplasm of liver and intrahepatic bile duct: Secondary | ICD-10-CM | POA: Diagnosis not present

## 2016-01-07 ENCOUNTER — Encounter (HOSPITAL_COMMUNITY): Payer: Medicare Other

## 2016-01-07 ENCOUNTER — Encounter (HOSPITAL_BASED_OUTPATIENT_CLINIC_OR_DEPARTMENT_OTHER): Payer: Medicare Other

## 2016-01-07 ENCOUNTER — Telehealth (HOSPITAL_COMMUNITY): Payer: Self-pay | Admitting: *Deleted

## 2016-01-07 ENCOUNTER — Encounter (HOSPITAL_COMMUNITY): Payer: Self-pay

## 2016-01-07 DIAGNOSIS — C189 Malignant neoplasm of colon, unspecified: Secondary | ICD-10-CM

## 2016-01-07 DIAGNOSIS — C787 Secondary malignant neoplasm of liver and intrahepatic bile duct: Principal | ICD-10-CM

## 2016-01-07 DIAGNOSIS — Z5112 Encounter for antineoplastic immunotherapy: Secondary | ICD-10-CM | POA: Diagnosis present

## 2016-01-07 LAB — COMPREHENSIVE METABOLIC PANEL
ALT: 14 U/L (ref 14–54)
ANION GAP: 5 (ref 5–15)
AST: 26 U/L (ref 15–41)
Albumin: 3.4 g/dL — ABNORMAL LOW (ref 3.5–5.0)
Alkaline Phosphatase: 54 U/L (ref 38–126)
BUN: 20 mg/dL (ref 6–20)
CHLORIDE: 111 mmol/L (ref 101–111)
CO2: 26 mmol/L (ref 22–32)
Calcium: 8.6 mg/dL — ABNORMAL LOW (ref 8.9–10.3)
Creatinine, Ser: 0.84 mg/dL (ref 0.44–1.00)
Glucose, Bld: 119 mg/dL — ABNORMAL HIGH (ref 65–99)
POTASSIUM: 4.2 mmol/L (ref 3.5–5.1)
Sodium: 142 mmol/L (ref 135–145)
Total Bilirubin: 0.3 mg/dL (ref 0.3–1.2)
Total Protein: 6.5 g/dL (ref 6.5–8.1)

## 2016-01-07 LAB — MAGNESIUM: MAGNESIUM: 1 mg/dL — AB (ref 1.7–2.4)

## 2016-01-07 LAB — CBC WITH DIFFERENTIAL/PLATELET
BASOS ABS: 0 10*3/uL (ref 0.0–0.1)
Basophils Relative: 0 %
Eosinophils Absolute: 0.2 10*3/uL (ref 0.0–0.7)
Eosinophils Relative: 5 %
HCT: 33 % — ABNORMAL LOW (ref 36.0–46.0)
Hemoglobin: 10.4 g/dL — ABNORMAL LOW (ref 12.0–15.0)
LYMPHS PCT: 22 %
Lymphs Abs: 1 10*3/uL (ref 0.7–4.0)
MCH: 29.6 pg (ref 26.0–34.0)
MCHC: 31.5 g/dL (ref 30.0–36.0)
MCV: 94 fL (ref 78.0–100.0)
Monocytes Absolute: 0.5 10*3/uL (ref 0.1–1.0)
Monocytes Relative: 11 %
Neutro Abs: 2.7 10*3/uL (ref 1.7–7.7)
Neutrophils Relative %: 62 %
PLATELETS: 194 10*3/uL (ref 150–400)
RBC: 3.51 MIL/uL — ABNORMAL LOW (ref 3.87–5.11)
RDW: 12.9 % (ref 11.5–15.5)
WBC: 4.4 10*3/uL (ref 4.0–10.5)

## 2016-01-07 MED ORDER — SODIUM CHLORIDE 0.9 % IV SOLN
Freq: Once | INTRAVENOUS | Status: AC
  Administered 2016-01-07: 09:00:00 via INTRAVENOUS

## 2016-01-07 MED ORDER — SODIUM CHLORIDE 0.9 % IV SOLN
4.9980 mg/kg | Freq: Once | INTRAVENOUS | Status: AC
  Administered 2016-01-07: 340 mg via INTRAVENOUS
  Filled 2016-01-07: qty 17

## 2016-01-07 MED ORDER — MAGNESIUM SULFATE 4 GM/100ML IV SOLN
4.0000 g | Freq: Once | INTRAVENOUS | Status: AC
  Administered 2016-01-07: 4 g via INTRAVENOUS
  Filled 2016-01-07: qty 100

## 2016-01-07 NOTE — Telephone Encounter (Signed)
Called patient to let her know her labs look good per Kirby Crigler PA-C

## 2016-01-07 NOTE — Patient Instructions (Signed)
[facility] Discharge Instructions for Patients Receiving Chemotherapy   Beginning January 23rd 2017 lab work for the [facility] will be done in the  Main lab at [facility] on 1st floor. If you have a lab appointment with the [facility] please come in thru the  Main Entrance and check in at the main information desk   Today you received the following chemotherapy agents:  Vectibix You also received 4 grams of Magnesium IV. Return as scheduled for lab work and infusions. Return as scheduled for office visit.   If you develop nausea and vomiting, or diarrhea that is not controlled by your medication, call the clinic.  The clinic phone number is [phone]. Office hours are Monday-Friday 8:30am-5:00pm.  BELOW ARE SYMPTOMS THAT SHOULD BE REPORTED IMMEDIATELY:  *FEVER GREATER THAN 101.0 F  *CHILLS WITH OR WITHOUT FEVER  NAUSEA AND VOMITING THAT IS NOT CONTROLLED WITH YOUR NAUSEA MEDICATION  *UNUSUAL SHORTNESS OF BREATH  *UNUSUAL BRUISING OR BLEEDING  TENDERNESS IN MOUTH AND THROAT WITH OR WITHOUT PRESENCE OF ULCERS  *URINARY PROBLEMS  *BOWEL PROBLEMS  UNUSUAL RASH Items with * indicate a potential emergency and should be followed up as soon as possible. If you have an emergency after office hours please contact your primary care physician or go to the nearest emergency department.  Please call the clinic during office hours if you have any questions or concerns.   You may also contact the Patient Navigator at [phone] should you have any questions or need assistance in obtaining follow up care.      Resources For Cancer Patients and their Caregivers ? American Cancer Society: Can assist with transportation, wigs, general needs, runs Look Good Feel Better.        7862961113 ? Cancer Care: Provides financial assistance, online support groups, medication/co-pay assistance.  1-800-813-HOPE 931-032-7773) ? [place] Assists [place] cancer patients and their families through emotional , educational and financial support.  564-185-9491 ? [place] DSS Where to apply for food stamps, Medicaid and utility assistance. 367-792-6815 ? RCATS: Transportation to medical appointments. ([phone] ? Social Security Administration: May apply for disability if have a Stage IV cancer. 610 740 [phone] ? [employer], Disability and Transit Services: Assists with nutrition, care and transit needs. [phone]

## 2016-01-07 NOTE — Progress Notes (Signed)
Tolerated infusion w/o adverse reaction.  Alert, in no distress.  VSS.  Discharged via wheelchair in c/o daughter.   

## 2016-01-08 ENCOUNTER — Ambulatory Visit (INDEPENDENT_AMBULATORY_CARE_PROVIDER_SITE_OTHER): Payer: Medicare Other | Admitting: Internal Medicine

## 2016-01-08 ENCOUNTER — Encounter (INDEPENDENT_AMBULATORY_CARE_PROVIDER_SITE_OTHER): Payer: Self-pay | Admitting: Internal Medicine

## 2016-01-08 VITALS — BP 130/62 | HR 60 | Temp 98.7°F | Resp 18 | Ht 60.0 in | Wt 133.0 lb

## 2016-01-08 DIAGNOSIS — K220 Achalasia of cardia: Secondary | ICD-10-CM | POA: Diagnosis not present

## 2016-01-08 DIAGNOSIS — R1310 Dysphagia, unspecified: Secondary | ICD-10-CM

## 2016-01-08 DIAGNOSIS — L89321 Pressure ulcer of left buttock, stage 1: Secondary | ICD-10-CM | POA: Diagnosis not present

## 2016-01-08 DIAGNOSIS — R1030 Lower abdominal pain, unspecified: Secondary | ICD-10-CM

## 2016-01-08 DIAGNOSIS — I129 Hypertensive chronic kidney disease with stage 1 through stage 4 chronic kidney disease, or unspecified chronic kidney disease: Secondary | ICD-10-CM | POA: Diagnosis not present

## 2016-01-08 DIAGNOSIS — C189 Malignant neoplasm of colon, unspecified: Secondary | ICD-10-CM | POA: Diagnosis not present

## 2016-01-08 DIAGNOSIS — D509 Iron deficiency anemia, unspecified: Secondary | ICD-10-CM | POA: Diagnosis not present

## 2016-01-08 DIAGNOSIS — C787 Secondary malignant neoplasm of liver and intrahepatic bile duct: Secondary | ICD-10-CM | POA: Diagnosis not present

## 2016-01-08 DIAGNOSIS — E039 Hypothyroidism, unspecified: Secondary | ICD-10-CM | POA: Diagnosis not present

## 2016-01-08 NOTE — Patient Instructions (Signed)
Esophagogastroduodenoscopy to be scheduled.  

## 2016-01-08 NOTE — Progress Notes (Signed)
Presenting complaint;  Painful swallowing.  Subjective:  Patient is 82 year old Caucasian female who is here for scheduled visit accompanied by daughter. She was last seen on 09/18/2015. He states she is not doing well. She has multiple complaints. She states her swallowing difficulty has gotten worse over the last week or so. Normally she has difficulty swallowing she also has painful swallowing. She was on doxycycline last month. She has had regurgitation mostly with liquids. She denies hematemesis. She has not seen any blood or tarry stool in her ileostomy bag. She is not having any rectal discharge. She is having intermittent lower abdominal pain and she gets relieved with Levsin sublingual. She would like to get a new prescription. She is complaining of severe right hip pain. She is scheduled to have another injection next week. She says she had one in April and did provide relief until now. She is taking pain pill every day for hip pain. She complains of intermittent nausea. She takes Compazine occasionally if ondansetron does not help. She has been on Victibix every 2 weeks and has experienced lot of side effects including rash dry skin involving her years and fingers. She also has noted daily nosebleed. She is aware that her CEA level keeps going up. She is scheduled to undergo CT for restaging in near future.  Last EGD with Botox injection for achalasia was in July 2015.   Current Medications: Outpatient Encounter Prescriptions as of 01/08/2016  Medication Sig  . ALPRAZolam (XANAX) 0.5 MG tablet Take 0.5 mg by mouth at bedtime.   Marland Kitchen aspirin EC 325 MG tablet Take 325 mg by mouth daily.  . Calcium Carbonate (CALTRATE 600 PO) Take 1 tablet by mouth daily.   . carvedilol (COREG) 12.5 MG tablet Take 1.5 tablets (18.75 mg total) by mouth 2 (two) times daily with a meal.  . fentaNYL (DURAGESIC - DOSED MCG/HR) 25 MCG/HR patch Place 1 patch (25 mcg total) onto the skin every 3 (three) days.  Marland Kitchen  HYDROcodone-acetaminophen (NORCO) 7.5-325 MG tablet Take 1 tablet by mouth every 4 (four) hours as needed.  . hyoscyamine (LEVSIN SL) 0.125 MG SL tablet Place 1 tablet (0.125 mg total) under the tongue 3 (three) times daily as needed (lower abdominal pain).  . isosorbide mononitrate (IMDUR) 30 MG 24 hr tablet Take 15 mg by mouth daily.  Marland Kitchen levothyroxine (SYNTHROID, LEVOTHROID) 75 MCG tablet Take 75 mcg by mouth daily.   . magnesium oxide (MAG-OX) 400 (241.3 Mg) MG tablet Take 1 tablet (400 mg total) by mouth 4 (four) times daily.  . Multiple Vitamins-Minerals (CENTRUM WOMEN PO) Take by mouth daily.  Marland Kitchen NITROSTAT 0.4 MG SL tablet DISSOLVE ONE TABLET UNDER THE TONGUE EVERY 5 MINUTES AS NEEDED FOR CHEST PAIN.  DO NOT EXCEED A TOTAL OF 3 DOSES IN 15 MINUTES  . nystatin-triamcinolone (MYCOLOG II) cream Apply 1 application topically 2 (two) times daily.  . Omega-3 Fatty Acids (FISH OIL) 1000 MG CAPS Take 1 capsule by mouth daily. Reported on 02/19/2015  . ondansetron (ZOFRAN ODT) 8 MG disintegrating tablet Take 1 tablet (8 mg total) by mouth every 8 (eight) hours as needed for nausea or vomiting.  . Panitumumab (VECTIBIX IV) Inject into the vein. To start 04/20/15. To be given every 2 weeks.  . pantoprazole (PROTONIX) 40 MG tablet Take 40 mg by mouth daily.   . prochlorperazine (COMPAZINE) 10 MG tablet Take 1 tablet (10 mg total) by mouth every 6 (six) hours as needed for nausea or vomiting.  Marland Kitchen  fentaNYL (DURAGESIC - DOSED MCG/HR) 12 MCG/HR Place 1 patch (12.5 mcg total) onto the skin every 3 (three) days. (Patient not taking: Reported on 01/08/2016)  . [DISCONTINUED] doxycycline (VIBRA-TABS) 100 MG tablet Take 1 tablet (100 mg total) by mouth 2 (two) times daily. (Patient not taking: Reported on 01/08/2016)   No facility-administered encounter medications on file as of 01/08/2016.      Objective: Blood pressure 130/62, pulse 60, temperature 98.7 F (37.1 C), temperature source Oral, resp. rate 18, height  5' (1.524 m), weight 133 lb (60.3 kg). Patient is alert and appears to be in some distress. Conjunctiva is pink. Sclera is nonicteric Oropharyngeal mucosa is normal. No neck masses or thyromegaly noted. Cardiac exam with regular rhythm normal S1 and S2. No murmur or gallop noted. Lungs are clear to auscultation. Abdomen is full with greenish stool in her ileostomy bag. Ileostomy is located in right mid abdomen. Abdomen is soft with mild tenderness below the right costal margin. No organomegaly or masses. No LE edema or clubbing noted. Papular skin rash noted involving both upper extremities as well as abdomen.  Labs/studies Results:   Recent Labs  01/07/16 0752  WBC 4.4  HGB 10.4*  HCT 33.0*  PLT 194    BMET   Recent Labs  01/07/16 0752  NA 142  K 4.2  CL 111  CO2 26  GLUCOSE 119*  BUN 20  CREATININE 0.84  CALCIUM 8.6*    LFT   Recent Labs  01/07/16 0752  PROT 6.5  ALBUMIN 3.4*  AST 26  ALT 14  ALKPHOS 54  BILITOT 0.3      Assessment:  #1. Odynophagia. Patient has chronic dysphagia secondary to known achalasia. This was diagnosed 9 years ago long before she was diagnosed with colon carcinoma she did not undergo definite therapy for reasons not clear to me. She has had partial response with Botox. Now she presents with painful swallowing and she could have developed pill esophagitis(doxycycline) or esophagitis secondary to chemotherapy. Since she is having daily symptoms further evaluation is warranted . #2. Achalasia as above.  #3. Lower abdominal pain responding to hyoscyamine sublingual. This pain could be secondary to proctitis or colonic spasm. #4. History of metastatic colon carcinoma. Colonic malignancy was first discovered in 1989 and she had second primary in 2010. She developed anastomotic leak and intra-abdominal abscesses with sepsis requiring prolonged hospitalization. She is felt to have developed yet another primary in remaining colon chest never  been endoscopically evaluated because of fused colonic stricture and noncompliant colon. She is receiving Vectibix every 2 weeks but her CEA keeps going up. He is due for follow-up CT stone.   Plan:  Esophagogastroduodenoscopy with esophageal dilation and Botox therapy as appropriate. Patient will need to be off aspirin for 3 days prior to procedure. New prescription given for hyoscyamine sublingual to be used on as-needed basis.

## 2016-01-09 ENCOUNTER — Ambulatory Visit (HOSPITAL_COMMUNITY)
Admission: RE | Admit: 2016-01-09 | Discharge: 2016-01-09 | Disposition: A | Discharge status: Home / Self Care | Payer: Medicare Other | Source: Ambulatory Visit | Attending: Hematology & Oncology | Admitting: Hematology & Oncology

## 2016-01-09 DIAGNOSIS — C19 Malignant neoplasm of rectosigmoid junction: Secondary | ICD-10-CM | POA: Diagnosis not present

## 2016-01-09 DIAGNOSIS — C787 Secondary malignant neoplasm of liver and intrahepatic bile duct: Secondary | ICD-10-CM | POA: Diagnosis not present

## 2016-01-09 DIAGNOSIS — R911 Solitary pulmonary nodule: Secondary | ICD-10-CM | POA: Diagnosis not present

## 2016-01-09 DIAGNOSIS — C189 Malignant neoplasm of colon, unspecified: Secondary | ICD-10-CM | POA: Diagnosis not present

## 2016-01-09 MED ORDER — IOPAMIDOL (ISOVUE-300) INJECTION 61%
INTRAVENOUS | Status: AC
  Administered 2016-01-09: 30 mL
  Filled 2016-01-09: qty 30

## 2016-01-09 MED ORDER — IOPAMIDOL (ISOVUE-300) INJECTION 61%
100.0000 mL | Freq: Once | INTRAVENOUS | Status: AC | PRN
  Administered 2016-01-09: 100 mL via INTRAVENOUS

## 2016-01-10 ENCOUNTER — Other Ambulatory Visit (INDEPENDENT_AMBULATORY_CARE_PROVIDER_SITE_OTHER): Payer: Self-pay | Admitting: *Deleted

## 2016-01-10 DIAGNOSIS — K220 Achalasia of cardia: Secondary | ICD-10-CM | POA: Insufficient documentation

## 2016-01-10 DIAGNOSIS — R1310 Dysphagia, unspecified: Secondary | ICD-10-CM

## 2016-01-11 ENCOUNTER — Telehealth (HOSPITAL_COMMUNITY): Payer: Self-pay | Admitting: *Deleted

## 2016-01-11 ENCOUNTER — Encounter (HOSPITAL_COMMUNITY): Payer: Self-pay | Admitting: Hematology & Oncology

## 2016-01-11 ENCOUNTER — Encounter (HOSPITAL_COMMUNITY): Payer: Medicare Other | Attending: Hematology and Oncology | Admitting: Hematology & Oncology

## 2016-01-11 ENCOUNTER — Encounter (HOSPITAL_COMMUNITY): Payer: Medicare Other

## 2016-01-11 VITALS — BP 119/57 | HR 70 | Temp 98.6°F | Resp 16 | Wt 134.9 lb

## 2016-01-11 DIAGNOSIS — C787 Secondary malignant neoplasm of liver and intrahepatic bile duct: Secondary | ICD-10-CM | POA: Diagnosis not present

## 2016-01-11 DIAGNOSIS — D509 Iron deficiency anemia, unspecified: Secondary | ICD-10-CM | POA: Insufficient documentation

## 2016-01-11 DIAGNOSIS — Z7189 Other specified counseling: Secondary | ICD-10-CM

## 2016-01-11 DIAGNOSIS — R970 Elevated carcinoembryonic antigen [CEA]: Secondary | ICD-10-CM

## 2016-01-11 DIAGNOSIS — C189 Malignant neoplasm of colon, unspecified: Secondary | ICD-10-CM | POA: Diagnosis not present

## 2016-01-11 MED ORDER — MAGNESIUM SULFATE 4 GM/100ML IV SOLN
4.0000 g | Freq: Once | INTRAVENOUS | Status: AC
  Administered 2016-01-11: 4 g via INTRAVENOUS
  Filled 2016-01-11: qty 100

## 2016-01-11 MED ORDER — SODIUM CHLORIDE 0.9 % IV SOLN: 4.0000 g | Freq: Once | INTRAVENOUS | Status: DC

## 2016-01-11 NOTE — Telephone Encounter (Signed)
If you tell me what they are, I will call her.

## 2016-01-11 NOTE — Progress Notes (Signed)
Tolerated infusion well. Stable on discharge home via wheelchair with daughter.

## 2016-01-11 NOTE — Patient Instructions (Addendum)
[name] at [facility] Discharge Instructions  RECOMMENDATIONS MADE BY THE CONSULTANT AND ANY TEST RESULTS WILL BE SENT TO YOUR REFERRING PHYSICIAN.  You saw Dr.Penland today.  Call [name] with plan.  See [name] at checkout for appointments.  Thank you for choosing [name] at [name] to provide your oncology and hematology care.  To afford each patient quality time with our provider, please arrive at least 15 minutes before your scheduled appointment time.   Beginning January 23rd 2017 lab work for the [name] will be done in the  Main lab at [facility] on 1st floor. If you have a lab appointment with the [name] please come in thru the  Main Entrance and check in at the main information desk  You need to re-schedule your appointment should you arrive 10 or more minutes late.  We strive to give you quality time with our providers, and arriving late affects you and other patients whose appointments are after yours.  Also, if you no show three or more times for appointments you may be dismissed from the clinic at the providers discretion.     Again, thank you for choosing [name]  Our hope is that these requests will decrease the amount of time that you wait before being seen by our physicians.       _____________________________________________________________  Should you have questions after your visit to [name], please contact our office at [phone] between the hours of 8:30 a.m. and 4:30 p.m.  Voicemails left after 4:30 p.m. will not be returned until the following business day.  For prescription refill requests, have your pharmacy contact our office.         Resources For Cancer Patients and their Caregivers ? American Cancer Society: Can assist with transportation, wigs, general needs, runs Look Good Feel Better.        702-574-1530 ? Cancer Care: Provides financial  assistance, online support groups, medication/co-pay assistance.  1-800-813-HOPE 9198133331) ? [place] Assists [place] cancer patients and their families through emotional , educational and financial support.  240-035-8457 ? [place] DSS Where to apply for food stamps, Medicaid and utility assistance. [phone] ? RCATS: Transportation to medical appointments. 4163645767 ? Social Security Administration: May apply for disability if have a Stage IV cancer. 404-562-7420 302-821-8550 ? [employer], Disability and Transit Services: Assists with nutrition, care and transit needs. [place] Support Programs: @10RELATIVEDAYS @ > Cancer Support Group  2nd Tuesday of the month 1pm-2pm, [name]  > Creative Journey  3rd Tuesday of the month 1130am-1pm, [name]  > Look Good Feel Better  1st Wednesday of the month 10am-12 noon, [name] (Call [name] to register [phone])

## 2016-01-11 NOTE — Progress Notes (Signed)
[place] [facility] [place], MD [address] [place] [address]   DIAGNOSIS: Colon carcinoma with permanent ostomy Diversion Colitis Achalasia Iron deficiency, anemia secondary to GI related blood loss Hemeoccult positive stools Rising CEA with CT imaging on 07/13/2014 1.3 cm mass in L hepatic lobe Subcapsular mass in anterior pole of L kidney suspicious for RCC Cutaneous microwave ablation of her now biopsy-proven metastatic colonic adenocarcinoma to the left liver 09/01/2014   Colon cancer metastasized to liver [facility])   04/12/1987 Definitive Surgery    Dr. Lindalou Hose at [facility]      09/01/2014 Pathology Results    Liver, needle/core biopsy - METASTATIC ADENOCARCINOMA.      09/01/2014 Miscellaneous    MWA left liver lesion, [name] (IR)      01/18/2015 Progression    PET scan      01/18/2015 PET scan    Recurrence of  tumor within segment 2 of the liver. There are 2 new lesions identified within segment 5 and segment 6 of the liver worrisome for additional sites of metastasis. Evidence of tumor recurrence at the intra colonic anastomosis is noted...      01/28/2015 - 04/16/2015 Chemotherapy    Xeloda 1500 mg BID 7 days on and 7 days off beginning in December 2016.      04/12/2015 Imaging    CT C/A/P progression of disease, enlargement of metastatic lesion in segments 2/3 of liver, development of mass with colon at junction of descending colon and sigmoid, indeterminate lesion in lower pole of the left kidney      04/20/2015 - 01/07/2016 Antibody Plan    Vectibix every 2 weeks, single-agent.      06/12/2015 - 06/13/2015 Hospital Admission    Hypotension, diarrhea, ARF, UTI      06/15/2015 Adverse Reaction    Vectibix rash      06/15/2015 Treatment Plan Change    Vectibix reduced to 5 mg/m2      07/13/2015 Treatment Plan Change    Treatment deferred due to hypomagnesemia and palpitations.  Escorted to ED.      09/24/2015 Imaging    CT abd/pelvis-  Liver lesions as follows:  Segment 2 left liver lobe hypodense 3.5 x 2.2 cm liver mass, previously 3.1 x 2.0 cm, mildly increased. Medial segment 6 right liver lobe 1.7 x 0.9 cm hypodense liver mass, new. Peripheral segment 6 right liver lobe 1.0 x 0.7 cm hypodense liver mass, new. Serosal 0.7 cm segment 5 right liver lobe mass, previously 0.6 cm, not appreciably changed. Hypodense 0.6 cm liver lesion adjacent to the IVC, previously 0.6 cm, unchanged.      09/24/2015 Imaging    CT chest- New 3 mm right upper lobe pulmonary nodule, indeterminate, pulmonary metastasis not excluded.      11/04/2015 Imaging    CT abd/pelvis- Mechanical small-bowel obstruction at the level of the mid small bowel, probably due to adhesions. No free air. No fluid collections. 2. Liver metastases appear stable. No new or progressive metastatic disease in the abdomen or pelvis.      01/09/2016 Imaging    CT CAP- Interval increase in size and prominence of RIGHT upper lobe pulmonary nodule measuring 4 mm is highly concerning for pulmonary metastasis. New enhancing lesion in the LEFT lateral hepatic lobe consistent with progression of hepatic metastasis. Stable mass along the descending colon was hypermetabolic on comparison PET-CT scan.  01/09/2016 Progression    CT imaging demonstrates new hepatic lobe lesion                 History of Present Illness: Karen returns today for further follow-up of stage IV CRC s/p microwave ablation of a solitary liver lesion. Unfortunately repeat imaging revealed other liver disease and recurrence at the prior anastomotic site.  She also has a L renal lesion that is under observation. She has other health issues including chronic LBP. She has progressed through [place]., Vectibux. Karen [name] is accompanied by her daughter. She presents in a wheelchair and is wearing a blanket. I have reviewed the labs and scans with the patient. Her PS is  marginal.   She says that she has been very cold. She is wearing a blanket during the visit.   She has very dry skin that has been flaking off very frequently.   Pt is anxious about her scan results.   She has an EGD with Dr. Laural Golden on 01/18/16.   She continues to insist on therapy noting that she has to take care of her husband. We have discussed end of life issues/goals of care repeatedly over the past year.  Appetite is unchanged.   MEDICAL HISTORY: Past Medical History:  Diagnosis Date  . Abdominal adhesions   . Achalasia    a. s/p botox injection for achalasia.  . Anemia   . Arthritis   . Asthma    hx of  . Blood transfusion   . Breast lump   . Cancer ([place])    bladder  . Chest pain    a. 2002 Cath: nl cors;  b. 2009 aden mv: nl;  c. 05/2009 Echo: nl. d. 2014: normal nuclear stress test, EF 69%.  . Chronic diastolic CHF (congestive heart failure) (HCC)    a. nl EF by echo 05/2009.  . CKD (chronic kidney disease), stage III    pt. states decreased kidney function  . Colon cancer ([place])    a. recurrence 2016 with liver mets -  percutaneous liver biopsy and thermal ablation of a liver metastasis by interventional radiology..  . Colon polyp   . Depression   . Diabetes mellitus without complication ([place]) 09/21/55   borderline type II; takes no medication for it  . DVT of deep femoral vein ([place])   . Family history of bladder cancer   . Family history of breast cancer   . Family history of colon cancer   . Fibromyalgia   . GERD (gastroesophageal reflux disease)   . Headache   . Hearing loss   . Hernia   . History of blood clots   . History of PSVT (paroxysmal supraventricular tachycardia)   . Hyperlipidemia   . Hypertension   . Ileostomy present ([place])   . Iron deficiency anemia due to chronic blood loss 07/06/2013  . LBBB (left bundle branch block)   . Obstruction of intestine or colon 06/2014  . Sinus bradycardia    a. HR 30s in 08/2014 requiring holding of digoxin.    Marland Kitchen Thyroid disease   . UTI (urinary tract infection)   . Vision abnormalities 2016   not going blind but having retina problems; might lose ability to see faces  . Wears dentures     has Left bundle branch block; Chest pain, exertional; Benign hypertensive heart disease without heart failure; Hypothyroid; Small bowel obstruction, partial; Dysphagia; Paroxysmal SVT (supraventricular tachycardia) ([place]); Ileostomy status ([place]); Unstable angina ([place]); Parastomal hernia; Iron deficiency  anemia due to chronic blood loss; Malabsorption of iron; Acute abdominal pain; Acute renal failure ([place]); Hyperglycemia; UTI (urinary tract infection); Small bowel obstruction; Abdominal pain, acute; Liver mass, left lobe; Colon cancer metastasized to liver [facility]); Liver lesion; Sinus bradycardia; Family history of colon cancer; Family history of bladder cancer; Family history of breast cancer; Genetic testing; Counseling regarding end of life decision making; Neoplasm related pain; AKI (acute kidney injury) ([place]); Urinary tract infection; Hypotension; Diarrhea; Elevated troponin; CKD (chronic kidney disease) stage 3, GFR 30-59 ml/min; Hypomagnesemia; SBO (small bowel obstruction); GERD (gastroesophageal reflux disease); HTN (hypertension); Achalasia; Odynophagia; Chest pain; and Goals of care, counseling/discussion on her problem list.     is allergic to bactrim [sulfamethoxazole-trimethoprim]; codeine; demerol; lidocaine hcl; morphine and related; dilaudid [hydromorphone hcl]; nitrofurantoin monohyd macro; lisinopril; and ramipril.   Iron Infusion in March 2016. Mammogram performed in 2015. Reported multiple hernias.   SURGICAL HISTORY: Past Surgical History:  Procedure Laterality Date  . ABDOMINAL HYSTERECTOMY    . APPENDECTOMY    . BOTOX INJECTION N/A 09/01/2013   Procedure: BOTOX INJECTION;  Surgeon: Rogene Houston, MD;  Location: [facility];  Service: Endoscopy;  Laterality: N/A;  . BREAST SURGERY     right  breast biopsy  . CARDIAC CATHETERIZATION  12/10/2000   THE LEFT VENTRICLE IS MILDY DILATED. THERE IS MILD TO MODERATE DIFFUSE HYPOKINESIS WITH EF 35%  . CATARACT EXTRACTION W/PHACO  11/13/2011   Procedure: CATARACT EXTRACTION PHACO AND INTRAOCULAR LENS PLACEMENT (IOC);  Surgeon: Tonny Branch, MD;  Location: [facility] ORS;  Service: Ophthalmology;  Laterality: Right;  CDE 12.26  . CATARACT EXTRACTION W/PHACO  12/08/2011   Procedure: CATARACT EXTRACTION PHACO AND INTRAOCULAR LENS PLACEMENT (IOC);  Surgeon: Tonny Branch, MD;  Location: [facility] OR[facility];  Service: Ophthalmology;  Laterality: Left;  CDE 15.11  . CHOLECYSTECTOMY    . COLON CANCER SURGERY    . COLON SURGERY    . COLONOSCOPY  09/26/2011   Procedure: COLONOSCOPY;  Surgeon: Rogene Houston, MD;  Location: [facility];  Service: Endoscopy;  Laterality: N/A;  215  . CORONARY ANGIOPLASTY    . ESOPHAGEAL DILATION N/A 01/18/2016   Procedure: ESOPHAGEAL DILATION;  Surgeon: Rogene Houston, MD;  Location: [facility];  Service: Endoscopy;  Laterality: N/A;  . ESOPHAGOGASTRODUODENOSCOPY  02/13/2011   Procedure: ESOPHAGOGASTRODUODENOSCOPY (EGD);  Surgeon: Rogene Houston, MD;  [facility];  Service: Endoscopy;  Laterality: N/A;  1030  . ESOPHAGOGASTRODUODENOSCOPY N/A 09/01/2013   Procedure: ESOPHAGOGASTRODUODENOSCOPY (EGD);  Surgeon: Rogene Houston, MD;  [facility];  Service: Endoscopy;  Laterality: N/A;  830  . ESOPHAGOGASTRODUODENOSCOPY N/A 01/18/2016   Procedure: ESOPHAGOGASTRODUODENOSCOPY (EGD);  Surgeon: Rogene Houston, MD;  Location: [facility];  Service: Endoscopy;  Laterality: N/A;  1120  . EYE SURGERY  2014   catarac surgery  . ILEOSTOMY    . ILEOSTOMY    . JOINT REPLACEMENT Left 2008  . liver biopsy and ablation  09/01/14  . ovarian cystectomy 1955    . PORTACATH PLACEMENT Left 02/08/2016   Procedure: INSERTION PORT-A-CATH;  Surgeon: Aviva Signs, MD;  Location: [facility];  Service: General;  Laterality: Left;  . ROTATOR CUFF  REPAIR    . TONSILLECTOMY      SOCIAL HISTORY: Social History   Social History  . Marital status: Married    Spouse name: N/A  . Number of children: 4  . Years of education: N/A   Occupational History  . Not on file.  Social History Main Topics  . Smoking status: Former Smoker    Packs/day: 0.25    Years: 13.00    Types: Cigarettes    Quit date: 02/11/1963  . Smokeless tobacco: Never Used  . Alcohol use No  . Drug use: No  . Sexual activity: No   Other Topics Concern  . Not on file   Social History Narrative  . No narrative on file   Reads inspirational books Has 46 Great-Grandchildren and 11 Grandchildren  Father died 44 Mother died 58 Oldest brother died 24  FAMILY HISTORY: Family History  Problem Relation Age of Onset  . Heart disease Mother   . Stroke Mother     deceased  . Arthritis Mother   . Bladder Cancer Mother     dx in her 65s  . Colon cancer Mother   . Arthritis Father   . Heart disease Father     decesaed  . Leukemia Father   . Stroke Sister     alive/debilitated  . Hypertension Sister   . Other Sister     paralysis  . Heart disease Brother     bypass surgery  . Arthritis Brother   . Colon cancer Brother   . Bladder Cancer Brother   . Stroke Sister     alive/debilitated  . Diabetes Sister   . Other Brother     stomach problems  . Other Brother     bladder  . Colon cancer Paternal Aunt   . Bladder Cancer Other     dx twice in her 30s-40s  . Breast cancer Other     great niece dx in her early 24s  . Prostate cancer Other     newphew  . Colon polyps Daughter   . Stroke    . Hypertension       Review of Systems  Constitutional: Positive for chills.  HENT: Negative.   Eyes: Negative.   Respiratory: Negative.   Cardiovascular: Negative.   Gastrointestinal: Negative.   Genitourinary: Negative.   Musculoskeletal: Negative.   Skin: Negative.        Dry, flaking skin  Neurological: Negative.   Endo/Heme/Allergies:  Negative.   Psychiatric/Behavioral: The patient is nervous/anxious.   All other systems reviewed and are negative.  14 point review of systems was performed and is negative except as detailed under history of present illness and above   PHYSICAL EXAMINATION  Vitals with BMI 01/11/2016  Height   Weight 134 lbs 14 oz  BMI   Systolic 161  Diastolic 57  Pulse 70  Respirations 16    ECOG PERFORMANCE STATUS: 2 - Symptomatic, <50% confined to bed   Physical Exam  Constitutional: She is oriented to person, place, and time and well-developed, well-nourished, and in no distress.  In a wheelchair.   HENT:  Head: Normocephalic and atraumatic.  Eyes: EOM are normal. Pupils are equal, round, and reactive to light. No scleral icterus.  Neck: Normal range of motion. Neck supple.  Cardiovascular: Normal rate, regular rhythm and normal heart sounds.   Pulmonary/Chest: Effort normal and breath sounds normal.  Abdominal: Soft. Bowel sounds are normal. She exhibits no distension and no mass. There is no tenderness. There is no rebound and no guarding.  Ostomy intact  Musculoskeletal: Normal range of motion. She exhibits no edema.  Lymphadenopathy:    She has no cervical adenopathy.  Neurological: She is alert and oriented to person, place, and time. Gait normal.  Skin: Skin is warm and  dry.  Psychiatric:  Flat affect  Nursing note and vitals reviewed.    LABORATORY DATA:  Results for Karen, [name] (MRN [id number]) as of 01/11/2016 07:51  Ref. Range 01/07/2016 07:52  Sodium Latest Ref Range: 135 - 145 mmol/L 142  Potassium Latest Ref Range: 3.5 - 5.1 mmol/L 4.2  Chloride Latest Ref Range: 101 - 111 mmol/L 111  CO2 Latest Ref Range: 22 - 32 mmol/L 26  BUN Latest Ref Range: 6 - 20 mg/dL 20  Creatinine Latest Ref Range: 0.44 - 1.00 mg/dL 0.84  Calcium Latest Ref Range: 8.9 - 10.3 mg/dL 8.6 (L)  EGFR (Non-African Amer.) Latest Ref Range: >60 mL/min >60  EGFR (African American) Latest Ref  Range: >60 mL/min >60  Glucose Latest Ref Range: 65 - 99 mg/dL 119 (H)  Anion gap Latest Ref Range: 5 - 15  5  Magnesium Latest Ref Range: 1.7 - 2.4 mg/dL 1.0 (L)  Alkaline Phosphatase Latest Ref Range: 38 - 126 U/L 54  Albumin Latest Ref Range: 3.5 - 5.0 g/dL 3.4 (L)  AST Latest Ref Range: 15 - 41 U/L 26  ALT Latest Ref Range: 14 - 54 U/L 14  Total Protein Latest Ref Range: 6.5 - 8.1 g/dL 6.5  Total Bilirubin Latest Ref Range: 0.3 - 1.2 mg/dL 0.3  WBC Latest Ref Range: 4.0 - 10.5 K/uL 4.4  RBC Latest Ref Range: 3.87 - 5.11 MIL/uL 3.51 (L)  Hemoglobin Latest Ref Range: 12.0 - 15.0 g/dL 10.4 (L)  HCT Latest Ref Range: 36.0 - 46.0 % 33.0 (L)  MCV Latest Ref Range: 78.0 - 100.0 fL 94.0  MCH Latest Ref Range: 26.0 - 34.0 pg 29.6  MCHC Latest Ref Range: 30.0 - 36.0 g/dL 31.5  RDW Latest Ref Range: 11.5 - 15.5 % 12.9  Platelets Latest Ref Range: 150 - 400 K/uL 194  Neutrophils Latest Units: % 62  Lymphocytes Latest Units: % 22  Monocytes Relative Latest Units: % 11  Eosinophil Latest Units: % 5  Basophil Latest Units: % 0  NEUT# Latest Ref Range: 1.7 - 7.7 K/uL 2.7  Lymphocyte # Latest Ref Range: 0.7 - 4.0 K/uL 1.0  Monocyte # Latest Ref Range: 0.1 - 1.0 K/uL 0.5  Eosinophils Absolute Latest Ref Range: 0.0 - 0.7 K/uL 0.2  Basophils Absolute Latest Ref Range: 0.0 - 0.1 K/uL 0.0   Results for Karen, [name] (MRN [id number])   Ref. Range 08/31/2015 08:10 09/14/2015 08:09 09/28/2015 08:14 10/12/2015 09:14 11/09/2015 11:05 11/23/2015 11:02 12/07/2015 11:00  CEA Latest Ref Range: 0.0 - 4.7 ng/mL 188.4 (H) 168.7 (H) 212.8 (H) 227.0 (H) 232.3 (H) 272.0 (H) 303.8 (H)     RADIOLOGY: I have reviewed the images detailed below and agree with the results:  Study Result   CLINICAL DATA:  Colorectal carcinoma with liver metastasis. Subsequent treatment evaluation.  EXAM: CT CHEST, ABDOMEN, AND PELVIS WITH CONTRAST  TECHNIQUE: Multidetector CT imaging of the chest, abdomen and pelvis  was performed following the standard protocol during bolus administration of intravenous contrast.  CONTRAST:  23m ISOVUE-300 IOPAMIDOL (ISOVUE-300) INJECTION 61%, 1023mISOVUE-300 IOPAMIDOL (ISOVUE-300) INJECTION 61%  COMPARISON:  CT 11/05/2015, PET-CT 01/18/2015  FINDINGS: CT CHEST FINDINGS  Cardiovascular: Coronary artery calcification and aortic atherosclerotic calcification.  Mediastinum/Nodes: No axillary supraclavicular adenopathy. No mediastinal hilar adenopathy. No pericardial fluid esophagus.  Lungs/Pleura: 4 mm subpleural nodule in the RIGHT upper lobe (image 47, series 3) is increased from 3 mm on prior. LEFT lower lobe 2 mm nodule image 105, series 3 is  unchanged.  Musculoskeletal: No aggressive osseous lesion.  CT ABDOMEN AND PELVIS FINDINGS  Hepatobiliary: New lesion in the LEFT lateral hepatic lobe measures 3.3 by 2.6 cm and bulges the capsule (image 57, series 2). This new lesion is inferior to the previously described hypodense lesion measuring 3.0 cm which is stable compared to prior. No additional new lesions are identified. Portal veins are patent.  Pancreas: Pancreas is normal. No ductal dilatation. No pancreatic inflammation.  Spleen: Normal spleen  Adrenals/urinary tract: Adrenal glands and kidneys are normal. The ureters and bladder normal.  Stomach/Bowel: Stomach, duodenum small bowel are normal. There is a RIGHT lateral abdominal wall laxity. There is a RIGHT lower quadrant ileostomy without obstruction.  Along the descending colon there is a lobular mass measuring 3.1 by 2.8 cm which compares to 3.3 x 3.1 cm on CT of 09/24/2015. This lesion was hypermetabolic on comparison PET-CT scan.  Vascular/Lymphatic: Abdominal aorta is normal caliber. There is no retroperitoneal or periportal lymphadenopathy. No pelvic lymphadenopathy.  Reproductive: Post hysterectomy  Other: No peritoneal metastasis  identified  Musculoskeletal: No aggressive osseous lesion.  IMPRESSION: Chest Impression:  1. Interval increase in size and prominence of RIGHT upper lobe pulmonary nodule measuring 4 mm is highly concerning for pulmonary metastasis. 2. No mediastinal lymphadenopathy  Abdomen / Pelvis Impression:  1. New enhancing lesion in the LEFT lateral hepatic lobe consistent with progression of hepatic metastasis. 2. Stable mass along the descending colon was hypermetabolic on comparison PET-CT scan. 3. RIGHT lower quadrant ileostomy without complication.   Electronically Signed   By: Suzy Bouchard M.D.   On: 01/09/2016 17:23     ASSESSMENT and THERAPY PLAN:  Colon carcinoma with permanent ostomy Diversion Colitis Achalasia Iron deficiency, anemia secondary to GI related blood loss Hemeoccult positive stools Rising CEA with CT imaging on 07/13/2014 1.3 cm mass in L hepatic lobe Subcapsular mass in anterior pole of L kidney suspicious for RCC Biopsy of L liver lesion and MWA Left liver lesion 09/01/2014 Taste Alteration KRAS WT XELODA therapy, week on, week off started 01/2015 Progressive disease Difficulty discussing end of life decision making [facility] admission for diarrhea ? Vectibix induced Hypomagnesemia secondary to Vectibix therapy  We reviewed her imaging. She has progressive disease. Results are noted above. I again addressed end of life issues, goals of care and the patient will not talk about these things.  We again reviewed side effects of the multiple therapy options for stage IV CRC. She would not do well with FOLFOX, FOLFIRI, I also do not believe she would tolerate single agent irinotecan.  WE discussed Lonsurf, stivarga, dose reduced irinotecan, hospice   She has, not suprisingly decided to go with treatment. She will discuss this further with her family through the weekend and they will call [facility].    We will schedule a follow up after  she decides which treatment plan she would like to pursue.   All questions were answered. The patient knows to call the clinic with any problems, questions or concerns. We can certainly see the patient much sooner if necessary.  This document serves as a record of services personally performed by Ancil Linsey, MD. It was created on her behalf by Martinique Casey, a trained medical scribe. The creation of this record is based on the scribe's personal observations and the provider's statements to them. This document has been checked and approved by the attending provider.  I have reviewed the above documentation for accuracy and completeness, and I agree  with the above.  This document was electronically signed.   Kelby Fam. Whitney Muse, MD

## 2016-01-14 DIAGNOSIS — M1611 Unilateral primary osteoarthritis, right hip: Secondary | ICD-10-CM | POA: Diagnosis not present

## 2016-01-15 DIAGNOSIS — L89321 Pressure ulcer of left buttock, stage 1: Secondary | ICD-10-CM | POA: Diagnosis not present

## 2016-01-15 DIAGNOSIS — I129 Hypertensive chronic kidney disease with stage 1 through stage 4 chronic kidney disease, or unspecified chronic kidney disease: Secondary | ICD-10-CM | POA: Diagnosis not present

## 2016-01-15 DIAGNOSIS — D509 Iron deficiency anemia, unspecified: Secondary | ICD-10-CM | POA: Diagnosis not present

## 2016-01-15 DIAGNOSIS — C189 Malignant neoplasm of colon, unspecified: Secondary | ICD-10-CM | POA: Diagnosis not present

## 2016-01-15 DIAGNOSIS — E039 Hypothyroidism, unspecified: Secondary | ICD-10-CM | POA: Diagnosis not present

## 2016-01-15 DIAGNOSIS — C787 Secondary malignant neoplasm of liver and intrahepatic bile duct: Secondary | ICD-10-CM | POA: Diagnosis not present

## 2016-01-15 NOTE — Patient Instructions (Addendum)
[facility]   CHEMOTHERAPY INSTRUCTIONS  You have Stage 4 colorectal cancer.  We are switching your therapy to irinotecan.  You had progressed through treatment with Vectibix as your last scans has shown.  You will receive irinotecan weekly.  This treatment is with palliative intent, which means you are treatable but not curable.  You will see the doctor regularly throughout treatment.  We monitor your lab work prior to every treatment.  The doctor monitors your response to treatment by the way you are feeling, your blood work, and scans periodically.  You will receive the following premedications prior to each chemotherapy: Premeds: Aloxi - high powered nausea/vomiting prevention medication used for chemotherapy patients. Dexamethasone - steroid - given to reduce the risk of you having an allergic type reaction to the chemotherapy. Dex can cause you to feel energized, nervous/anxious/jittery, make you have trouble sleeping, and/or make you feel hot/flushed in the face/neck and/or look pink/red in the face/neck. These side effects will pass as the Dex wears off. (takes 20 minutes to infuse)  POTENTIAL SIDE EFFECTS OF TREATMENT:  Irinotecan Hydrochloride (Generic Name) Other Names: Camptosar, camptothecin-11, CPT-11  About this drug Irinotecan hydrochloride is used to treat cancer. This drug is given in the vein (IV).  This will take 1.5 hours to infuse.  Possible side effects (more common) . Loose bowel movements (diarrhea) that may last for a few days . Bone marrow depression. This is a decrease in the number of white blood cells, red blood cells, and platelets. This may raise your risk of infection, make you tired and weak (fatigue), and raise your risk of bleeding. . Nausea and throwing up (vomiting). These symptoms may happen within a few hours or many hours after your treatment and may last up to 24 hours. Medicines are available to stop or lessen these  side effects. . Hair loss: Most often hair loss is temporary; your hair should grow back when treatment is done.  Possible side effects (less common) . Skin and tissue irritation. You may have redness, pain, warmth, or swelling at the IV site. . Weakness . Trouble Breathing . Decreased Appetite (decreased hunger)  Treating side effects . Drink 6-8 cups of fluids each day unless your doctor has told you to limit your fluid intake due to some other health problem. A cup is 8 ounces of fluid. If you throw up or have loose bowel movements, you should drink more fluids so that you do not become dehydrated (lack water in the body from losing too much fluid). . Ask your doctor or nurse about medicine that is available to help stop or lessen the loose bowel movements. . Talk with your nurse about getting a wig before you lose your hair. Also, call the [facility] at [phone]-[phone] to find out information about the "Look Good, Feel Better" program close to where you live. It is a free program where women getting chemotherapy can learn about wigs, turbans and scarves as well as makeup techniques and skin and nail care. . Use effective methods of birth control during your cancer treatment. Talk with your doctor or nurse about options for birth control. . Vaginal lubricants can be used to lessen vaginal dryness, itching, and pain during sexual relations.  Food and drug interactions There are no known interactions of irinotecan hydrochloride with food. This drug may interact with other medicines. Tell your doctor and pharmacist about all the medicines and dietary supplements (vitamins, minerals, herbs and  others) that you are taking at this time. The safety and use of dietary supplements and alternative diets are often not known. Using these might affect your cancer or interfere with your treatment. Until more is known, you should not use dietary supplements or alternative diets without your cancer  doctor's help.  When to call the doctor Call your doctor or nurse right away if you have any of these symptoms: . Loose bowel movements (diarrhea) more than 5 or 6 times a day or loose bowel movements with weakness or feeling lightheaded . Temperature of 100.4 F (38 C) or above . Chills . Trouble breathing or feeling short of breath . Easy bruising or bleeding . Nausea that stops you from eating or drinking . Throwing up more than three times in one day . Redness, pain, warmth, or swelling at the IV site . Feeling dizzy, lightheaded, or if you pass out Call your doctor or nurse as soon as possible if you have any of these symptoms: . Nausea that is not relieved by prescribed medicines . Extreme tiredness that interferes with normal activities  Sexual problems and reproduction concerns . Infertility warning: Sexual problems and reproduction concerns may happen. In both men and women, this drug may affect your ability to have children. This cannot be determined before your treatment. Talk with your doctor or nurse if you plan to have children. Ask for information on sperm or egg banking. . In men, this drug may interfere with your ability to make sperm, but it should not change your ability to have sexual relations. . In women, menstrual bleeding may become irregular or stop while you are getting this drug. Do not assume that you cannot become pregnant if you do not have a menstrual period. . Women may go through signs of menopause (change of life) like vaginal dryness or itching. Vaginal lubricants can be used to lessen vaginal dryness, itching, and pain during sexual relations. . Genetic counseling is available for you to talk about the effects of this drug therapy on future pregnancies. Also, a genetic counselor can look at the possible risk of problems in the unborn baby due to this medicine if an exposure happens during pregnancy. . Pregnancy warning: This drug may have harmful effects on  the unborn child, so effective methods of birth control should be used during your cancer treatment. . Breast feeding warning: Women should not breast feed during treatment because this drug could enter the breast milk and badly harm a breast feeding baby.    EDUCATIONAL MATERIALS GIVEN AND REVIEWED: Information on irinotecan.    SELF CARE ACTIVITIES WHILE ON CHEMOTHERAPY: Hydration Increase your fluid intake 48 hours prior to treatment and drink at least 8 to 12 cups (64 ounces) of water/decaff beverages per day after treatment. You can still have your cup of coffee or soda but these beverages do not count as part of your 8 to 12 cups that you need to drink daily. No alcohol intake.  Medications Continue taking your normal prescription medication as prescribed.  If you start any new herbal or new supplements please let us know first to make sure it is safe.  Mouth Care Have teeth cleaned professionally before starting treatment. Keep dentures and partial plates clean. Use soft toothbrush and do not use mouthwashes that contain alcohol. Biotene is a good mouthwash that is available at most pharmacies or may be ordered by calling [phone]. Use warm salt water gargles (1 teaspoon salt per 1 quart warm  water) before and after meals and at bedtime. Or you may rinse with 2 tablespoons of three-percent hydrogen peroxide mixed in eight ounces of water. If you are still having problems with your mouth or sores in your mouth please call the clinic. If you need dental work, please let Dr. Whitney Muse know before you go for your appointment so that we can coordinate the best possible time for you in regards to your chemo regimen. You need to also let your dentist know that you are actively taking chemo. We may need to do labs prior to your dental appointment.   Skin Care Always use sunscreen that has not expired and with SPF (Sun Protection Factor) of 50 or higher. Wear hats to protect your head from  the sun. Remember to use sunscreen on your hands, ears, face, & feet.  Use good moisturizing lotions such as udder cream, eucerin, or even Vaseline. Some chemotherapies can cause dry skin, color changes in your skin and nails.    . Avoid long, hot showers or baths. . Use gentle, fragrance-free soaps and laundry detergent. . Use moisturizers, preferably creams or ointments rather than lotions because the thicker consistency is better at preventing skin dehydration. Apply the cream or ointment within 15 minutes of showering. Reapply moisturizer at night, and moisturize your hands every time after you wash them.  Hair Loss (if your doctor says your hair will fall out)  . If your doctor says that your hair is likely to fall out, decide before you begin chemo whether you want to wear a wig. You may want to shop before treatment to match your hair color. . Hats, turbans, and scarves can also camouflage hair loss, although some people prefer to leave their heads uncovered. If you go bare-headed outdoors, be sure to use sunscreen on your scalp. . Cut your hair short. It eases the inconvenience of shedding lots of hair, but it also can reduce the emotional impact of watching your hair fall out. . Don't perm or color your hair during chemotherapy. Those chemical treatments are already damaging to hair and can enhance hair loss. Once your chemo treatments are done and your hair has grown back, it's OK to resume dyeing or perming hair. With chemotherapy, hair loss is almost always temporary. But when it grows back, it may be a different color or texture. In older adults who still had hair color before chemotherapy, the new growth may be completely gray.  Often, new hair is very fine and soft.  Infection Prevention Please wash your hands for at least 30 seconds using warm soapy water. Handwashing is the #1 way to prevent the spread of germs. Stay away from sick people or people who are getting over a cold. If you  develop respiratory systems such as green/yellow mucus production or productive cough or persistent cough let us know and we will see if you need an antibiotic. It is a good idea to keep a pair of gloves on when going into grocery stores/[employer] to decrease your risk of coming into contact with germs on the carts, etc. Carry alcohol hand gel with you at all times and use it frequently if out in public. If your temperature reaches 100.5 or higher please call the clinic and let us know.  If it is after hours or on the weekend please go to the ER if your temperature is over 100.5.  Please have your own personal thermometer at home to use.    Sex and bodily  fluids If you are going to have sex, a condom must be used to protect the person that isn't taking chemotherapy. Chemo can decrease your libido (sex drive). For a few days after chemotherapy, chemotherapy can be excreted through your bodily fluids.  When using the toilet please close the lid and flush the toilet twice.  Do this for a few day after you have had chemotherapy.     Effects of chemotherapy on your sex life Some changes are simple and won't last long. They won't affect your sex life permanently. Sometimes you may feel: . too tired . not strong enough to be very active . sick or sore  . not in the mood . anxious or low Your anxiety might not seem related to sex. For example, you may be worried about the cancer and how your treatment is going. Or you may be worried about money, or about how you family are coping with your illness. These things can cause stress, which can affect your interest in sex. It's important to talk to your partner about how you feel. Remember - the changes to your sex life don't usually last long. There's usually no medical reason to stop having sex during chemo. The drugs won't have any long term physical effects on your performance or enjoyment of sex. Cancer can't be passed on to your partner during  sex  Contraception It's important to use reliable contraception during treatment. Avoid getting pregnant while you or your partner are having chemotherapy. This is because the drugs may harm the baby. Sometimes chemotherapy drugs can leave a man or woman infertile.  This means you would not be able to have children in the future. You might want to talk to someone about permanent infertility. It can be very difficult to learn that you may no longer be able to have children. Some people find counselling helpful. There might be ways to preserve your fertility, although this is easier for men than for women. You may want to speak to a fertility expert. You can talk about sperm banking or harvesting your eggs. You can also ask about other fertility options, such as donor eggs. If you have or have had breast cancer, your doctor might advise you not to take the contraceptive pill. This is because the hormones in it might affect the cancer.  It is not known for sure whether or not chemotherapy drugs can be passed on through semen or secretions from the vagina. Because of this some doctors advise people to use a barrier method if you have sex during treatment. This applies to vaginal, anal or oral sex. Generally, doctors advise a barrier method only for the time you are actually having the treatment and for about a week after your treatment. Advice like this can be worrying, but this does not mean that you have to avoid being intimate with your partner. You can still have close contact with your partner and continue to enjoy sex.  Animals If you have cats or birds we just ask that you not change the litter or change the cage.  Please have someone else do this for you while you are on chemotherapy.   Food Safety During and After Cancer Treatment Food safety is important for people both during and after cancer treatment. Cancer and cancer treatments, such as chemotherapy, radiation therapy, and stem cell/bone  marrow transplantation, often weaken the immune system. This makes it harder for your body to protect itself from foodborne illness, also called food  poisoning. Foodborne illness is caused by eating food that contains harmful bacteria, parasites, or viruses.  Foods to avoid Some foods have a higher risk of becoming tainted with bacteria. These include: Marland Kitchen Unwashed fresh fruit and vegetables, especially leafy vegetables that can hide dirt and other contaminants . Raw sprouts, such as alfalfa sprouts . Raw or undercooked beef, especially ground beef, or other raw or undercooked meat and poultry . Fatty, fried, or spicy foods immediately before or after treatment.  These can sit heavy on your stomach and make you feel nauseous. . Raw or undercooked shellfish, such as oysters. . Sushi and sashimi, which often contain raw fish.  . Unpasteurized beverages, such as unpasteurized fruit juices, raw milk, raw yogurt, or cider . Undercooked eggs, such as soft boiled, over easy, and poached; raw, unpasteurized eggs; or foods made with raw egg, such as homemade raw cookie dough and homemade mayonnaise Simple steps for food safety Shop smart. . Do not buy food stored or displayed in an unclean area. . Do not buy bruised or damaged fruits or vegetables. . Do not buy cans that have cracks, dents, or bulges. . Pick up foods that can spoil at the end of your shopping trip and store them in a cooler on the way home. Prepare and clean up foods carefully. . Rinse all fresh fruits and vegetables under running water, and dry them with a clean towel or paper towel. . Clean the top of cans before opening them. . After preparing food, wash your hands for 20 seconds with hot water and soap. Pay special attention to areas between fingers and under nails. . Clean your utensils and dishes with hot water and soap. Marland Kitchen Disinfect your kitchen and cutting boards using 1 teaspoon of liquid, unscented bleach mixed into 1 quart of  water.   Dispose of old food. . Eat canned and packaged food before its expiration date (the "use by" or "best before" date). . Consume refrigerated leftovers within 3 to 4 days. After that time, throw out the food. Even if the food does not smell or look spoiled, it still may be unsafe. Some bacteria, such as Listeria, can grow even on foods stored in the refrigerator if they are kept for too long. Take precautions when eating out. . At restaurants, avoid buffets and salad bars where food sits out for a long time and comes in contact with many people. Food can become contaminated when someone with a virus, often a norovirus, or another "bug" handles it. . Put any leftover food in a "to-go" container yourself, rather than having the server do it. And, refrigerate leftovers as soon as you get home. . Choose restaurants that are clean and that are willing to prepare your food as you order it cooked.    MEDICATIONS: Imodium AD- Take 2 at diarrhea onset , then 1 every 2hr until 12hrs with no BM. May take 2 every 4hrs at night. If diarrhea recurs repeat.  Zofran/Ondansetron 8mg  tablet. Take 1 tablet every 8 hours as needed for nausea/vomiting. (#1 nausea med to take, this can constipate)  Compazine/Prochlorperazine 10mg  tablet. Take 1 tablet every 6 hours as needed for nausea/vomiting. (#2 nausea med to take, this can make you sleepy)   Over-the-Counter Meds:  Miralax 1 capful in 8 oz of fluid daily. May increase to two times a day if needed. This is a stool softener. If this doesn't work proceed you can add:  Senokot S-start with 1 tablet two times a day and increase to 4 tablets two times a day if needed. (total of 8 tablets in a 24 hour period). This is a stimulant laxative.   Call us if this does not help your bowels move.   Imodium 2mg  capsule. Take 2 capsules after  the 1st loose stool and then 1 capsule every 2 hours until you go a total of 12 hours without having a loose stool. Call the [facility] if loose stools continue. If diarrhea occurs @ bedtime, take 2 capsules @ bedtime. Then take 2 capsules every 4 hours until morning. Call [facility].    Constipation Sheet *Miralax in 8 oz of fluid daily.  May increase to two times a day if needed.  This is a stool softener.  If this not enough to keep your bowel regular:  You can add:  *Senokot S, start with one tablet twice a day and can increase to 4 tablets twice a day if needed.  This is a stimulant laxative.   Sometimes when you take pain medication you need BOTH a medicine to keep your stool soft and a medicine to help your bowel push it out!  Please call if the above does not work for you.   Do not go more than 2 days without a bowel movement.  It is very important that you do not become constipated.  It will make you feel sick to your stomach (nausea) and can cause abdominal pain and vomiting.    Diarrhea Sheet  If you are having loose stools/diarrhea, please purchase Imodium and begin taking as outlined:  At the first sign of poorly formed or loose stools you should begin taking Imodium(loperamide) 2 mg capsules.  Take two caplets (4mg ) followed by one caplet (2mg ) every 2 hours until you have had no diarrhea for 12 hours.  During the night take two caplets (4mg ) at bedtime and continue every 4 hours during the night until the morning.  Stop taking Imodium only after there is no sign of diarrhea for 12 hours.    Always call the [facility] if you are having loose stools/diarrhea that you can't get under control.  Loose stools/disrrhea leads to dehydration (loss of water) in your body.  We have other options of trying to get the loose stools/diarrhea to stopped but you must let us know!    Nausea Sheet  Zofran/Ondansetron 8mg  tablet. Take 1 tablet every 8 hours as needed for  nausea/vomiting. (#1 nausea med to take, this can constipate)  Compazine/Prochlorperazine 10mg  tablet. Take 1 tablet every 6 hours as needed for nausea/vomiting. (#2 nausea med to take, this can make you sleepy)  You can take these medications together or separately.  We would first like for you to try the Ondansetron by itself and then take the Prochloperizine if needed. But you are allowed to take both medications at the same time if your nausea is that severe.  If you are having persistent nausea (nausea that does  not stop) please take these medications on a staggered schedule so that the nausea medication stays in your body.  Please call the [facility] and let us know the amount of nausea that you are experiencing.  If you begin to vomit, you need to call the [facility] and if it is the weekend and you have vomited more than one time and cant get it to stop-go to the Emergency Room.  Persistent nausea/vomiting can lead to dehydration (loss of fluid in your body) and will make you feel terrible.   Ice chips, sips of clear liquids, foods that are @ room temperature, crackers, and toast tend to be better tolerated.    SYMPTOMS TO REPORT AS SOON AS POSSIBLE AFTER TREATMENT:  FEVER GREATER THAN 100.5 F  CHILLS WITH OR WITHOUT FEVER  NAUSEA AND VOMITING THAT IS NOT CONTROLLED WITH YOUR NAUSEA MEDICATION  UNUSUAL SHORTNESS OF BREATH  UNUSUAL BRUISING OR BLEEDING  TENDERNESS IN MOUTH AND THROAT WITH OR WITHOUT PRESENCE OF ULCERS  URINARY PROBLEMS  BOWEL PROBLEMS  UNUSUAL RASH    Wear comfortable clothing and clothing appropriate for easy access to any Portacath or PICC line. Let us know if there is anything that we can do to make your therapy better!    What to do if you need assistance after hours or on the weekends: CALL [phone].  HOLD on the line, do not hang up.  You will hear multiple messages but at the end you will be connected with a nurse triage line.  They will  contact Dr Whitney Muse if necessary.  Most of the time they will be able to assist you.   Do not call the hospital operator.  Dr Whitney Muse will not answer phone calls received by them.     I have been informed and understand all of the instructions given to me and have received a copy. I have been instructed to call the clinic [phone] or my family physician as soon as possible for continued medical care, if indicated. I do not have any more questions at this time but understand that I may call the [facility] or the Patient Navigator at [phone] during office hours should I have questions or need assistance in obtaining follow-up care.

## 2016-01-17 ENCOUNTER — Other Ambulatory Visit (HOSPITAL_COMMUNITY): Payer: Self-pay | Admitting: Emergency Medicine

## 2016-01-17 ENCOUNTER — Encounter (HOSPITAL_COMMUNITY): Payer: Self-pay | Admitting: Emergency Medicine

## 2016-01-17 MED ORDER — LOPERAMIDE HCL 2 MG PO TABS: ORAL_TABLET | ORAL | 1 refills | Status: DC

## 2016-01-17 NOTE — Progress Notes (Signed)
Chemotherapy education pulled together.  Schedule made.  Premeds escribed.  Labs entered.

## 2016-01-17 NOTE — Progress Notes (Signed)
START OFF PATHWAY REGIMEN - Colorectal  Off Pathway: Irinotecan 180 mg/m2 q14 Days  [id number]:Irinotecan 180 mg/m2 q14 Days:   A cycle is every 14 days:     Irinotecan (Camptosar(R)) 180 mg/m2 in 500 mL D5W IV over 90 minutes on day 1, every 14 days Dose Mod: None  **Always confirm dose/schedule in your pharmacy ordering system**    Patient Characteristics: Metastatic Colorectal, [facility] and Beyond, BRAF Wild-Type/Unknown, MSS / pMMR AJCC T Stage: X AJCC N Stage: X AJCC Stage Grouping: IVB AJCC M Stage: X Current evidence of distant metastases? Yes BRAF Mutation Status: Quantity Not Sufficient KRAS/NRAS Mutation Status: Wild Type (no mutation) Line of therapy: Fourth Line and Beyond Would you be surprised if this patient died  in the next year? I would NOT be surprised if this patient died in the next year Microsatellite/Mismatch Repair Status: MSS/pMMR  Intent of Therapy: Non-Curative / Palliative Intent, Discussed with Patient

## 2016-01-18 ENCOUNTER — Ambulatory Visit (HOSPITAL_COMMUNITY)
Admission: RE | Admit: 2016-01-18 | Discharge: 2016-01-18 | Disposition: A | Discharge status: Home / Self Care | Payer: Medicare Other | Source: Ambulatory Visit | Attending: Internal Medicine | Admitting: Internal Medicine

## 2016-01-18 ENCOUNTER — Encounter (HOSPITAL_COMMUNITY)
Admission: RE | Disposition: A | Discharge status: Home / Self Care | Payer: Self-pay | Source: Ambulatory Visit | Attending: Internal Medicine

## 2016-01-18 ENCOUNTER — Encounter (HOSPITAL_COMMUNITY): Payer: Self-pay | Admitting: *Deleted

## 2016-01-18 DIAGNOSIS — R1310 Dysphagia, unspecified: Secondary | ICD-10-CM | POA: Insufficient documentation

## 2016-01-18 DIAGNOSIS — Z85038 Personal history of other malignant neoplasm of large intestine: Secondary | ICD-10-CM | POA: Diagnosis not present

## 2016-01-18 DIAGNOSIS — E1122 Type 2 diabetes mellitus with diabetic chronic kidney disease: Secondary | ICD-10-CM | POA: Insufficient documentation

## 2016-01-18 DIAGNOSIS — C189 Malignant neoplasm of colon, unspecified: Secondary | ICD-10-CM | POA: Diagnosis not present

## 2016-01-18 DIAGNOSIS — R1319 Other dysphagia: Secondary | ICD-10-CM | POA: Diagnosis not present

## 2016-01-18 DIAGNOSIS — E785 Hyperlipidemia, unspecified: Secondary | ICD-10-CM | POA: Insufficient documentation

## 2016-01-18 DIAGNOSIS — I5032 Chronic diastolic (congestive) heart failure: Secondary | ICD-10-CM | POA: Insufficient documentation

## 2016-01-18 DIAGNOSIS — N183 Chronic kidney disease, stage 3 (moderate): Secondary | ICD-10-CM | POA: Diagnosis not present

## 2016-01-18 DIAGNOSIS — K449 Diaphragmatic hernia without obstruction or gangrene: Secondary | ICD-10-CM | POA: Insufficient documentation

## 2016-01-18 DIAGNOSIS — Z86718 Personal history of other venous thrombosis and embolism: Secondary | ICD-10-CM | POA: Insufficient documentation

## 2016-01-18 DIAGNOSIS — Z7982 Long term (current) use of aspirin: Secondary | ICD-10-CM | POA: Diagnosis not present

## 2016-01-18 DIAGNOSIS — K220 Achalasia of cardia: Secondary | ICD-10-CM | POA: Diagnosis not present

## 2016-01-18 DIAGNOSIS — Z87891 Personal history of nicotine dependence: Secondary | ICD-10-CM | POA: Insufficient documentation

## 2016-01-18 HISTORY — PX: ESOPHAGEAL DILATION: SHX303

## 2016-01-18 HISTORY — PX: ESOPHAGOGASTRODUODENOSCOPY: SHX5428

## 2016-01-18 SURGERY — EGD (ESOPHAGOGASTRODUODENOSCOPY)
Anesthesia: Moderate Sedation

## 2016-01-18 MED ORDER — SODIUM CHLORIDE 0.9 % IV SOLN
INTRAVENOUS | Status: DC
  Administered 2016-01-18: 12:00:00 via INTRAVENOUS

## 2016-01-18 MED ORDER — FENTANYL CITRATE (PF) 100 MCG/2ML IJ SOLN
INTRAMUSCULAR | Status: DC | PRN
  Administered 2016-01-18 (×3): 25 ug via INTRAVENOUS

## 2016-01-18 MED ORDER — MIDAZOLAM HCL 5 MG/5ML IJ SOLN
INTRAMUSCULAR | Status: AC
  Filled 2016-01-18: qty 10

## 2016-01-18 MED ORDER — MEPERIDINE HCL 50 MG/ML IJ SOLN
INTRAMUSCULAR | Status: AC
  Filled 2016-01-18: qty 1

## 2016-01-18 MED ORDER — MIDAZOLAM HCL 5 MG/5ML IJ SOLN
INTRAMUSCULAR | Status: DC | PRN
  Administered 2016-01-18 (×2): 1 mg via INTRAVENOUS
  Administered 2016-01-18: 2 mg via INTRAVENOUS
  Administered 2016-01-18 (×2): 1 mg via INTRAVENOUS

## 2016-01-18 MED ORDER — BUTAMBEN-TETRACAINE-BENZOCAINE 2-2-14 % EX AERO
INHALATION_SPRAY | CUTANEOUS | Status: DC | PRN
Start: 2016-01-18 — End: 2016-01-18
  Administered 2016-01-18: 2 via TOPICAL

## 2016-01-18 MED ORDER — FENTANYL CITRATE (PF) 100 MCG/2ML IJ SOLN
INTRAMUSCULAR | Status: AC
  Filled 2016-01-18: qty 2

## 2016-01-18 MED ORDER — ONABOTULINUMTOXINA 100 UNITS IJ SOLR
INTRAMUSCULAR | Status: DC | PRN
  Administered 2016-01-18: 100 [IU] via INTRAMUSCULAR

## 2016-01-18 NOTE — H&P (Signed)
Karen [name] is an 82 y.o. female.   Chief Complaint: Patient is here for EGD and ED and Botox therapy for achalasia HPI: Patient is 82 year old Caucasian female with multiple medical problems including metastatic colon carcinoma who has been having dysphagia more frequent with regurgitation. Her symptoms change over the last few weeks. She has undergone ototoxic therapy 3 times in the past with partial relief of her symptoms. She is hoping that her swallowing improved to the point it was a few weeks ago. Patient tells me that she is not responding to chemotherapy and has an appointment with Dr. Whitney Muse to review other options.  Past Medical History:  Diagnosis Date  . Abdominal adhesions   . Achalasia    a. s/p botox injection for achalasia.  . Anemia   . Arthritis   . Asthma    hx of  . Blood transfusion   . Breast lump   . Cancer ([place])    bladder  . Chest pain    a. 2002 Cath: nl cors;  b. 2009 aden mv: nl;  c. 05/2009 Echo: nl. d. 2014: normal nuclear stress test, EF 69%.  . Chronic diastolic CHF (congestive heart failure) (HCC)    a. nl EF by echo 05/2009.  . CKD (chronic kidney disease), stage III    pt. states decreased kidney function  . Colon cancer ([place])    a. recurrence 2016 with liver mets -  percutaneous liver biopsy and thermal ablation of a liver metastasis by interventional radiology..  . Colon polyp   . Depression   . Diabetes mellitus without complication ([place]) 123456   borderline type II; takes no medication for it  . DVT of deep femoral vein ([place])   . Family history of bladder cancer   . Family history of breast cancer   . Family history of colon cancer   . Fibromyalgia   . GERD (gastroesophageal reflux disease)   . Headache   . Hearing loss   . Hernia   . History of blood clots   . History of PSVT (paroxysmal supraventricular tachycardia)   . Hyperlipidemia   . Hypertension   . Ileostomy present ([place])   . Iron deficiency anemia due to chronic blood  loss 07/06/2013  . LBBB (left bundle branch block)   . Obstruction of intestine or colon 06/2014  . Sinus bradycardia    a. HR 30s in 08/2014 requiring holding of digoxin.  Marland Kitchen Thyroid disease   . UTI (urinary tract infection)   . Vision abnormalities 2016   not going blind but having retina problems; might lose ability to see faces  . Wears dentures     Past Surgical History:  Procedure Laterality Date  . ABDOMINAL HYSTERECTOMY    . APPENDECTOMY    . BOTOX INJECTION N/A 09/01/2013   Procedure: BOTOX INJECTION;  Surgeon: Rogene Houston, MD;  Location: [facility];  Service: Endoscopy;  Laterality: N/A;  . BREAST SURGERY     right breast biopsy  . CARDIAC CATHETERIZATION  12/10/2000   THE LEFT VENTRICLE IS MILDY DILATED. THERE IS MILD TO MODERATE DIFFUSE HYPOKINESIS WITH EF 35%  . CATARACT EXTRACTION W/PHACO  11/13/2011   Procedure: CATARACT EXTRACTION PHACO AND INTRAOCULAR LENS PLACEMENT (IOC);  Surgeon: Tonny Branch, MD;  Location: [facility];  Service: Ophthalmology;  Laterality: Right;  CDE 12.26  . CATARACT EXTRACTION W/PHACO  12/08/2011   Procedure: CATARACT EXTRACTION PHACO AND INTRAOCULAR LENS PLACEMENT (IOC);  Surgeon: Tonny Branch, MD;  Location:  [facility];  Service: Ophthalmology;  Laterality: Left;  CDE 15.11  . CHOLECYSTECTOMY    . COLON CANCER SURGERY    . COLON SURGERY    . COLONOSCOPY  09/26/2011   Procedure: COLONOSCOPY;  Surgeon: Rogene Houston, MD;  Location: [facility];  Service: Endoscopy;  Laterality: N/A;  215  . CORONARY ANGIOPLASTY    . ESOPHAGOGASTRODUODENOSCOPY  02/13/2011   Procedure: ESOPHAGOGASTRODUODENOSCOPY (EGD);  Surgeon: Rogene Houston, MD;  [facility]: [facility];  Service: Endoscopy;  Laterality: N/A;  1030  . ESOPHAGOGASTRODUODENOSCOPY N/A 09/01/2013   Procedure: ESOPHAGOGASTRODUODENOSCOPY (EGD);  Surgeon: Rogene Houston, MD;  Location: [facility];  Service: Endoscopy;  Laterality: N/A;  830  . EYE SURGERY  2014   catarac surgery  . ILEOSTOMY    .  ILEOSTOMY    . JOINT REPLACEMENT Left 2008  . liver biopsy and ablation  09/01/14  . ovarian cystectomy 1955    . ROTATOR CUFF REPAIR    . TONSILLECTOMY      Family History  Problem Relation Age of Onset  . Heart disease Mother   . Stroke Mother     deceased  . Arthritis Mother   . Bladder Cancer Mother     dx in her 76s  . Colon cancer Mother   . Arthritis Father   . Heart disease Father     decesaed  . Leukemia Father   . Stroke Sister     alive/debilitated  . Hypertension Sister   . Other Sister     paralysis  . Heart disease Brother     bypass surgery  . Arthritis Brother   . Colon cancer Brother   . Bladder Cancer Brother   . Stroke Sister     alive/debilitated  . Diabetes Sister   . Other Brother     stomach problems  . Other Brother     bladder  . Colon cancer Paternal Aunt   . Bladder Cancer Other     dx twice in her 30s-40s  . Breast cancer Other     great niece dx in her early 38s  . Prostate cancer Other     newphew  . Colon polyps Daughter   . Stroke    . Hypertension     Social History:  reports that she quit smoking about 52 years ago. Her smoking use included Cigarettes. She has a 3.25 pack-year smoking history. She has never used smokeless tobacco. She reports that she does not drink alcohol or use drugs.  Allergies:  Allergies  Allergen Reactions  . Bactrim [Sulfamethoxazole-Trimethoprim] Shortness Of Breath and Other (See Comments)    Hurting all over  . Codeine Palpitations and Other (See Comments)    Heart races  . Demerol Palpitations and Other (See Comments)    tachycardia  . Lidocaine Hcl Other (See Comments)    tachycardia  . Morphine And Related Palpitations    Tachycardia   . Dilaudid [Hydromorphone Hcl] Other (See Comments)    Hallucinations  . Nitrofurantoin Monohyd Macro Other (See Comments)    Unknown   . Lisinopril Cough  . Ramipril Cough    Medications Prior to Admission  Medication Sig Dispense Refill  .  ALPRAZolam (XANAX) 0.5 MG tablet Take 0.5 mg by mouth at bedtime.     Marland Kitchen aspirin EC 325 MG tablet Take 325 mg by mouth daily.    . Calcium Carbonate (CALTRATE 600 PO) Take 1 tablet by mouth daily.     Marland Kitchen  carvedilol (COREG) 12.5 MG tablet Take 1.5 tablets (18.75 mg total) by mouth 2 (two) times daily with a meal. 90 tablet 6  . fentaNYL (DURAGESIC - DOSED MCG/HR) 25 MCG/HR patch Place 1 patch (25 mcg total) onto the skin every 3 (three) days. 5 patch 0  . HYDROcodone-acetaminophen (NORCO) 7.5-325 MG tablet Take 1 tablet by mouth every 4 (four) hours as needed. (Patient taking differently: Take 1 tablet by mouth every 4 (four) hours as needed for moderate pain. ) 100 tablet 0  . hyoscyamine (LEVSIN SL) 0.125 MG SL tablet Place 1 tablet (0.125 mg total) under the tongue 3 (three) times daily as needed (lower abdominal pain). 90 tablet 2  . isosorbide mononitrate (IMDUR) 30 MG 24 hr tablet Take 15 mg by mouth daily.    Marland Kitchen levothyroxine (SYNTHROID, LEVOTHROID) 75 MCG tablet Take 75 mcg by mouth daily.     . magnesium oxide (MAG-OX) 400 (241.3 Mg) MG tablet Take 1 tablet (400 mg total) by mouth 4 (four) times daily. 120 tablet 1  . Multiple Vitamins-Minerals (CENTRUM WOMEN PO) Take 1 tablet by mouth daily.     . Omega-3 Fatty Acids (FISH OIL) 1000 MG CAPS Take 1 capsule by mouth daily. Reported on 02/19/2015    . pantoprazole (PROTONIX) 40 MG tablet Take 40 mg by mouth daily.     . IRINOTECAN HCL IV Inject into the vein. Weekly    . loperamide (IMODIUM A-D) 2 MG tablet Take 2 at diarrhea onset , then 1 every 2hr until 12hrs with no BM. May take 2 every 4hrs at night. If diarrhea recurs repeat. 100 tablet 1  . NITROSTAT 0.4 MG SL tablet DISSOLVE ONE TABLET UNDER THE TONGUE EVERY 5 MINUTES AS NEEDED FOR CHEST PAIN.  DO NOT EXCEED A TOTAL OF 3 DOSES IN 15 MINUTES 25 tablet 0  . ondansetron (ZOFRAN ODT) 8 MG disintegrating tablet Take 1 tablet (8 mg total) by mouth every 8 (eight) hours as needed for nausea or  vomiting. 30 tablet 2  . prochlorperazine (COMPAZINE) 10 MG tablet Take 1 tablet (10 mg total) by mouth every 6 (six) hours as needed for nausea or vomiting. 30 tablet 2    No results found for this or any previous visit (from the past 48 hour(s)). No results found.  ROS  Blood pressure (!) 174/73, pulse 80, temperature 98.5 F (36.9 C), temperature source Oral, resp. rate 19, height 5' (1.524 m), weight 134 lb (60.8 kg), SpO2 99 %. Physical Exam  Constitutional: She appears well-developed and well-nourished.  HENT:  Mouth/Throat: Oropharynx is clear and moist.  Eyes: Conjunctivae are normal. No scleral icterus.  Neck: No thyromegaly present.  Cardiovascular: Normal rate, regular rhythm and normal heart sounds.   No murmur heard. Respiratory: Effort normal and breath sounds normal.  GI:  Patient has ileostomy in right low quadrant of her abdomen. Abdomen is soft and nontender without organomegaly or masses.  Musculoskeletal: She exhibits no edema.  Lymphadenopathy:    She has no cervical adenopathy.  Neurological: She is alert.  Skin: Skin is warm and dry.     Assessment/Plan Progressive dysphagia secondary to achalasia. EGD with balloon dilation and Botox injection.  Hildred Laser, MD 01/18/2016, 12:50 PM

## 2016-01-18 NOTE — Discharge Instructions (Signed)
Resume aspirin on 01/21/2016 Resume other medications as before. Soft foods for 24 hours and then previous diet. No driving for 24 hours. Please call office with progress report in one week.   Upper Endoscopy, Care After Refer to this sheet in the next few weeks. These instructions provide you with information about caring for yourself after your procedure. Your health care provider may also give you more specific instructions. Your treatment has been planned according to current medical practices, but problems sometimes occur. Call your health care provider if you have any problems or questions after your procedure. What can I expect after the procedure? After the procedure, it is common to have:  A sore throat.  Bloating.  Nausea. Follow these instructions at home:  Follow instructions from your health care provider about what to eat or drink after your procedure.  Return to your normal activities as told by your health care provider. Ask your health care provider what activities are safe for you.  Take over-the-counter and prescription medicines only as told by your health care provider.  Do not drive for 24 hours if you received a sedative.  Keep all follow-up visits as told by your health care provider. This is important. Contact a health care provider if:  You have a sore throat that lasts longer than one day.  You have trouble swallowing. Get help right away if:  You have a fever.  You vomit blood or your vomit looks like coffee grounds.  You have bloody, black, or tarry stools.  You have a severe sore throat or you cannot swallow.  You have difficulty breathing.  You have severe pain in your chest or belly. This information is not intended to replace advice given to you by your health care provider. Make sure you discuss any questions you have with your health care provider. Document Released: 07/29/2011 Document Revised: 07/05/2015 Document Reviewed:  11/09/2014 Elsevier Interactive Patient Education  2017 [place] Meal Plan A soft-food meal plan includes foods that are safe and easy to swallow. This meal plan typically is used:  If you are having trouble chewing or swallowing foods.  As a transition meal plan after only having had liquid meals for a long period. What do I need to know about the soft-food meal plan? A soft-food meal plan includes tender foods that are soft and easy to chew and swallow. In most cases, bite-sized pieces of food are easier to swallow. A bite-sized piece is about  inch or smaller. Foods in this plan do not need to be ground or pureed. Foods that are very hard, crunchy, or sticky should be avoided. Also, breads, cereals, yogurts, and desserts with nuts, seeds, or fruits should be avoided. What foods can I eat? Grains  Rice and wild rice. Moist bread, dressing, pasta, and noodles. Well-moistened dry or cooked cereals, such as farina (cooked wheat cereal), oatmeal, or grits. Biscuits, breads, muffins, pancakes, and waffles that have been well moistened. Vegetables  Shredded lettuce. Cooked, tender vegetables, including potatoes without skins. Vegetable juices. Broths or creamed soups made with vegetables that are not stringy or chewy. Strained tomatoes (without seeds). Fruits  Canned or well-cooked fruits. Soft (ripe), peeled fresh fruits, such as peaches, nectarines, kiwi, cantaloupe, honeydew melon, and watermelon (without seeds). Soft berries with small seeds, such as strawberries. Fruit juices (without pulp). Meats and Other Protein Sources  Moist, tender, lean beef. Mutton. Lamb. Veal. Chicken. [place]. Liver. Ham. Fish without bones. Eggs. Dairy  Milk,  milk drinks, and cream. Plain cream cheese and cottage cheese. Plain yogurt. Sweets/Desserts  Flavored gelatin desserts. Custard. Plain ice cream, frozen yogurt, sherbet, milk shakes, and malts. Plain cakes and cookies. Plain hard  candy. Other  Butter, margarine (without trans fat), and cooking oils. Mayonnaise. Cream sauces. Mild spices, salt, and sugar. Syrup, molasses, honey, and jelly. The items listed above may not be a complete list of recommended foods or beverages. Contact your dietitian for more options.  What foods are not recommended? Grains  Dry bread, toast, crackers that have not been moistened. Coarse or dry cereals, such as bran, granola, and shredded wheat. Tough or chewy crusty breads, such as [place] bread or baguettes. Vegetables  Corn. Raw vegetables except shredded lettuce. Cooked vegetables that are tough or stringy. Tough, crisp, fried potatoes and potato skins. Fruits  Fresh fruits with skins or seeds or both, such as apples, pears, or grapes. Stringy, high-pulp fruits, such as papaya, pineapple, coconut, or mango. Fruit leather, fruit roll-ups, and all dried fruits. Meats and Other Protein Sources  Sausages and hot dogs. Meats with gristle. Fish with bones. Nuts, seeds, and chunky peanut or other nut butters. Sweets/Desserts  Cakes or cookies that are very dry or chewy. The items listed above may not be a complete list of foods and beverages to avoid. Contact your dietitian for more information.  This information is not intended to replace advice given to you by your health care provider. Make sure you discuss any questions you have with your health care provider. Document Released: 05/06/2007 Document Revised: 07/05/2015 Document Reviewed: 12/24/2012 Elsevier Interactive Patient Education  2017 [employer].

## 2016-01-18 NOTE — Op Note (Signed)
[facility] Patient Name: Karen [name] Procedure Date: 01/18/2016 12:50 PM MRN: [id number] Date of Birth: 06/12/1929 Attending MD: Hildred Laser , MD CSN: [id number] Age: 82 Admit Type: Outpatient Procedure:                Upper GI endoscopy Indications:              For botulinum toxin injection of achalasia Providers:                Hildred Laser, MD, Lurline Del, RN, Purcell Nails. [place],                            Technician Referring MD:             [name] MD, MD Medicines:                Cetacaine spray, Fentanyl 75 micrograms IV,                            Midazolam 6 mg IV Complications:            No immediate complications. Estimated Blood Loss:     Estimated blood loss was minimal. Procedure:                Pre-Anesthesia Assessment:                           - Prior to the procedure, a History and Physical                            was performed, and patient medications and                            allergies were reviewed. The patient's tolerance of                            previous anesthesia was also reviewed. The risks                            and benefits of the procedure and the sedation                            options and risks were discussed with the patient.                            All questions were answered, and informed consent                            was obtained. Prior Anticoagulants: The patient                            last took aspirin 4 days prior to the procedure.                            ASA Grade Assessment: III - A patient with severe  systemic disease. After reviewing the risks and                            benefits, the patient was deemed in satisfactory                            condition to undergo the procedure.                           After obtaining informed consent, the endoscope was                            passed under direct vision. Throughout the   procedure, the patient's blood pressure, pulse, and                            oxygen saturations were monitored continuously. The                            [id number]) scope was introduced through the                            mouth, and advanced to the second part of duodenum.                            The upper GI endoscopy was accomplished without                            difficulty. The patient tolerated the procedure                            well. Scope In: 1:04:55 PM Scope Out: 1:22:21 PM Total Procedure Duration: 0 hours 17 minutes 26 seconds  Findings:      A hypertonic lower esophageal sphincter was found. There was moderate       resistance to endoscope advancement into the stomach. The Z-line was       regular. The gastroesophageal junction and cardia were normal on       retroflexed view. A TTS dilator was passed through the scope. Dilation       with a 15-16.5-18 mm balloon dilator was performed to 18 mm. The       dilation site was examined and showed mild improvement in luminal       narrowing. no mucosal disruption noted. Area was successfully injected       with 100 units botulinum toxin.      The Z-line was regular and was found 34 cm from the incisors.      A 2 cm hiatal hernia was present.      The entire examined stomach was normal.      The duodenal bulb and second portion of the duodenum were normal. Impression:               - Achalasia. Dilated. Injected with botulinum toxin.                           - Z-line regular, 34 cm from the incisors.                           -  2 cm hiatal hernia.                           - Normal stomach.                           - Normal duodenal bulb and second portion of the                            duodenum.                           - No specimens collected. Moderate Sedation:      Moderate (conscious) sedation was administered by the endoscopy nurse       and supervised by the endoscopist. The following  parameters were       monitored: oxygen saturation, heart rate, blood pressure, CO2       capnography and response to care. Total physician intraservice time was       26 minutes. Recommendation:           - Patient has a contact number available for                            emergencies. The signs and symptoms of potential                            delayed complications were discussed with the                            patient. Return to normal activities tomorrow.                            Written discharge instructions were provided to the                            patient.                           - Mechanical soft diet today.                           - Resume previous diet starting tomorrow.                           - Continue present medications.                           - Resume aspirin at prior dose in 3 days. Procedure Code(s):        --- Professional ---                           [phone], Esophagogastroduodenoscopy, flexible,                            transoral; with transendoscopic balloon dilation of  esophagus (less than 30 mm diameter)                           [id number], Moderate sedation services provided by the                            same physician or other qualified health care                            professional performing the diagnostic or                            therapeutic service that the sedation supports,                            requiring the presence of an independent trained                            observer to assist in the monitoring of the                            patient's level of consciousness and physiological                            status; initial 15 minutes of intraservice time,                            patient age 23 years or older                           [phone], Moderate sedation services; each additional                            15 minutes intraservice time Diagnosis Code(s):        ---  Professional ---                           K22.0, Achalasia of cardia                           K44.9, Diaphragmatic hernia without obstruction or                            gangrene CPT copyright 2016 American [employer]. All rights reserved. The codes documented in this report are preliminary and upon coder review may  be revised to meet current compliance requirements. Hildred Laser, MD Hildred Laser, MD 01/18/2016 1:39:08 PM This report has been signed electronically. Number of Addenda: 0

## 2016-01-21 ENCOUNTER — Encounter (HOSPITAL_COMMUNITY): Payer: Self-pay

## 2016-01-21 ENCOUNTER — Other Ambulatory Visit (HOSPITAL_COMMUNITY): Payer: Self-pay | Admitting: Emergency Medicine

## 2016-01-21 ENCOUNTER — Ambulatory Visit (HOSPITAL_COMMUNITY): Payer: Medicare Other

## 2016-01-21 DIAGNOSIS — L03011 Cellulitis of right finger: Secondary | ICD-10-CM | POA: Diagnosis not present

## 2016-01-21 DIAGNOSIS — Z6826 Body mass index (BMI) 26.0-26.9, adult: Secondary | ICD-10-CM | POA: Diagnosis not present

## 2016-01-21 DIAGNOSIS — L723 Sebaceous cyst: Secondary | ICD-10-CM | POA: Diagnosis not present

## 2016-01-23 DIAGNOSIS — H35423 Microcystoid degeneration of retina, bilateral: Secondary | ICD-10-CM | POA: Diagnosis not present

## 2016-01-23 DIAGNOSIS — D3132 Benign neoplasm of left choroid: Secondary | ICD-10-CM | POA: Diagnosis not present

## 2016-01-23 DIAGNOSIS — H35073 Retinal telangiectasis, bilateral: Secondary | ICD-10-CM | POA: Diagnosis not present

## 2016-01-23 DIAGNOSIS — H43813 Vitreous degeneration, bilateral: Secondary | ICD-10-CM | POA: Diagnosis not present

## 2016-01-24 ENCOUNTER — Emergency Department (HOSPITAL_COMMUNITY): Payer: Medicare Other

## 2016-01-24 ENCOUNTER — Other Ambulatory Visit: Payer: Self-pay

## 2016-01-24 ENCOUNTER — Encounter (HOSPITAL_COMMUNITY): Payer: Self-pay

## 2016-01-24 ENCOUNTER — Observation Stay (HOSPITAL_COMMUNITY)
Admission: EM | Admit: 2016-01-24 | Discharge: 2016-01-24 | Disposition: A | Discharge status: Home / Self Care | Payer: Medicare Other | Attending: Internal Medicine | Admitting: Internal Medicine

## 2016-01-24 DIAGNOSIS — E1122 Type 2 diabetes mellitus with diabetic chronic kidney disease: Secondary | ICD-10-CM | POA: Diagnosis not present

## 2016-01-24 DIAGNOSIS — Z7982 Long term (current) use of aspirin: Secondary | ICD-10-CM | POA: Diagnosis not present

## 2016-01-24 DIAGNOSIS — J45909 Unspecified asthma, uncomplicated: Secondary | ICD-10-CM | POA: Diagnosis not present

## 2016-01-24 DIAGNOSIS — R079 Chest pain, unspecified: Principal | ICD-10-CM | POA: Diagnosis present

## 2016-01-24 DIAGNOSIS — R0602 Shortness of breath: Secondary | ICD-10-CM | POA: Insufficient documentation

## 2016-01-24 DIAGNOSIS — I130 Hypertensive heart and chronic kidney disease with heart failure and stage 1 through stage 4 chronic kidney disease, or unspecified chronic kidney disease: Secondary | ICD-10-CM | POA: Insufficient documentation

## 2016-01-24 DIAGNOSIS — Z8551 Personal history of malignant neoplasm of bladder: Secondary | ICD-10-CM | POA: Diagnosis not present

## 2016-01-24 DIAGNOSIS — R110 Nausea: Secondary | ICD-10-CM | POA: Insufficient documentation

## 2016-01-24 DIAGNOSIS — I208 Other forms of angina pectoris: Secondary | ICD-10-CM

## 2016-01-24 DIAGNOSIS — I5032 Chronic diastolic (congestive) heart failure: Secondary | ICD-10-CM | POA: Diagnosis not present

## 2016-01-24 DIAGNOSIS — N183 Chronic kidney disease, stage 3 (moderate): Secondary | ICD-10-CM | POA: Diagnosis not present

## 2016-01-24 DIAGNOSIS — Z85038 Personal history of other malignant neoplasm of large intestine: Secondary | ICD-10-CM | POA: Diagnosis not present

## 2016-01-24 DIAGNOSIS — Z79891 Long term (current) use of opiate analgesic: Secondary | ICD-10-CM | POA: Diagnosis not present

## 2016-01-24 LAB — HEPATIC FUNCTION PANEL
ALBUMIN: 3.4 g/dL — AB (ref 3.5–5.0)
ALK PHOS: 60 U/L (ref 38–126)
ALT: 16 U/L (ref 14–54)
AST: 24 U/L (ref 15–41)
Bilirubin, Direct: <0.1 mg/dL — ABNORMAL LOW (ref 0.1–0.5)
Total Bilirubin: 0.3 mg/dL (ref 0.3–1.2)
Total Protein: 6.1 g/dL — ABNORMAL LOW (ref 6.5–8.1)

## 2016-01-24 LAB — TROPONIN I
TROPONIN I: 0.03 ng/mL — AB (ref <0.03)
TROPONIN I: 0.03 ng/mL — AB (ref <0.03)
TROPONIN I: 0.03 ng/mL — AB (ref <0.03)

## 2016-01-24 LAB — CBC
HEMATOCRIT: 32.3 % — AB (ref 36.0–46.0)
Hemoglobin: 10.6 g/dL — ABNORMAL LOW (ref 12.0–15.0)
MCH: 30 pg (ref 26.0–34.0)
MCHC: 32.8 g/dL (ref 30.0–36.0)
MCV: 91.5 fL (ref 78.0–100.0)
PLATELETS: 180 10*3/uL (ref 150–400)
RBC: 3.53 MIL/uL — ABNORMAL LOW (ref 3.87–5.11)
RDW: 13 % (ref 11.5–15.5)
WBC: 5.5 10*3/uL (ref 4.0–10.5)

## 2016-01-24 LAB — BASIC METABOLIC PANEL
Anion gap: 7 (ref 5–15)
BUN: 19 mg/dL (ref 6–20)
CALCIUM: 7.5 mg/dL — AB (ref 8.9–10.3)
CO2: 25 mmol/L (ref 22–32)
Chloride: 110 mmol/L (ref 101–111)
Creatinine, Ser: 0.72 mg/dL (ref 0.44–1.00)
GFR calc Af Amer: >60 mL/min (ref >60)
GFR calc non Af Amer: >60 mL/min (ref >60)
Glucose, Bld: 94 mg/dL (ref 65–99)
POTASSIUM: 3.6 mmol/L (ref 3.5–5.1)
SODIUM: 142 mmol/L (ref 135–145)

## 2016-01-24 LAB — LIPASE, BLOOD: LIPASE: 32 U/L (ref 11–51)

## 2016-01-24 MED ORDER — ISOSORBIDE MONONITRATE ER 30 MG PO TB24
15.0000 mg | ORAL_TABLET | Freq: Every day | ORAL | Status: DC
  Administered 2016-01-24: 15 mg via ORAL
  Filled 2016-01-24: qty 1

## 2016-01-24 MED ORDER — ACETAMINOPHEN 325 MG PO TABS: 650.0000 mg | ORAL_TABLET | ORAL | Status: DC | PRN

## 2016-01-24 MED ORDER — PANTOPRAZOLE SODIUM 40 MG PO TBEC
40.0000 mg | DELAYED_RELEASE_TABLET | Freq: Every day | ORAL | Status: DC
  Administered 2016-01-24: 40 mg via ORAL
  Filled 2016-01-24: qty 1

## 2016-01-24 MED ORDER — ALPRAZOLAM 0.5 MG PO TABS: 0.5000 mg | ORAL_TABLET | Freq: Every day | ORAL | Status: DC

## 2016-01-24 MED ORDER — MAGNESIUM OXIDE 400 (241.3 MG) MG PO TABS
400.0000 mg | ORAL_TABLET | Freq: Four times a day (QID) | ORAL | Status: DC
  Administered 2016-01-24 (×2): 400 mg via ORAL
  Filled 2016-01-24 (×19): qty 1

## 2016-01-24 MED ORDER — LEVOTHYROXINE SODIUM 75 MCG PO TABS: 75.0000 ug | ORAL_TABLET | Freq: Every day | ORAL | Status: DC

## 2016-01-24 MED ORDER — CARVEDILOL 3.125 MG PO TABS
18.7500 mg | ORAL_TABLET | Freq: Two times a day (BID) | ORAL | Status: DC
  Administered 2016-01-24: 18.75 mg via ORAL
  Filled 2016-01-24: qty 2

## 2016-01-24 MED ORDER — HYDROCODONE-ACETAMINOPHEN 7.5-325 MG PO TABS
1.0000 | ORAL_TABLET | ORAL | Status: DC | PRN
  Administered 2016-01-24: 1 via ORAL
  Filled 2016-01-24: qty 1

## 2016-01-24 MED ORDER — ONDANSETRON HCL 4 MG/2ML IJ SOLN: 4.0000 mg | Freq: Four times a day (QID) | INTRAMUSCULAR | Status: DC | PRN

## 2016-01-24 MED ORDER — ASPIRIN EC 325 MG PO TBEC
325.0000 mg | DELAYED_RELEASE_TABLET | Freq: Every day | ORAL | Status: DC
Start: 2016-01-25 — End: 2016-01-24

## 2016-01-24 MED ORDER — ENOXAPARIN SODIUM 40 MG/0.4ML ~~LOC~~ SOLN
40.0000 mg | SUBCUTANEOUS | Status: DC
  Administered 2016-01-24: 40 mg via SUBCUTANEOUS
  Filled 2016-01-24: qty 0.4

## 2016-01-24 MED ORDER — FENTANYL 25 MCG/HR TD PT72: 25.0000 ug | MEDICATED_PATCH | TRANSDERMAL | Status: DC

## 2016-01-24 MED ORDER — HYDROCODONE-ACETAMINOPHEN 5-325 MG PO TABS
1.0000 | ORAL_TABLET | Freq: Once | ORAL | Status: AC
  Administered 2016-01-24: 1 via ORAL
  Filled 2016-01-24: qty 1

## 2016-01-24 MED ORDER — ASPIRIN 81 MG PO CHEW
324.0000 mg | CHEWABLE_TABLET | Freq: Once | ORAL | Status: AC
  Administered 2016-01-24: 324 mg via ORAL
  Filled 2016-01-24: qty 4

## 2016-01-24 NOTE — ED Provider Notes (Signed)
[facility] DEPT Provider Note   CSN: [id number] Arrival date & time: 01/24/16  [id number]     History   Chief Complaint Chief Complaint  Patient presents with  . Chest Pain    HPI Karen [name] is a 82 y.o. female.  Patient with history of metastatic colon cancer presenting with episode of chest pain that onset while at rest. States she was sitting in a chair resting when she had acute onset of central chest pain that radiated to her throat and left shoulder. This is associated with shortness of breath and nausea. Pain lasted about 45 minutes and resolved after taking a nitroglycerin. She reports no further pain that has some residual soreness in her left shoulder. States she has had "throat pain" with exertion for quite some time. She is not seen her cardiologist in several years. Last stress test was in 2014. Clean coronaries in 2002. Patient denies any vomiting. She did have some diaphoresis with the chest pain which is now resolved. She also has a history of acid reflux disease and achalasia but states this does not feel like her acid reflux.   The history is provided by the patient and a relative.  Chest Pain   Associated symptoms include nausea and shortness of breath. Pertinent negatives include no abdominal pain, no dizziness, no fever, no headaches, no vomiting and no weakness.    Past Medical History:  Diagnosis Date  . Abdominal adhesions   . Achalasia    a. s/p botox injection for achalasia.  . Anemia   . Arthritis   . Asthma    hx of  . Blood transfusion   . Breast lump   . Cancer ([place])    bladder  . Chest pain    a. 2002 Cath: nl cors;  b. 2009 aden mv: nl;  c. 05/2009 Echo: nl. d. 2014: normal nuclear stress test, EF 69%.  . Chronic diastolic CHF (congestive heart failure) (HCC)    a. nl EF by echo 05/2009.  . CKD (chronic kidney disease), stage III    pt. states decreased kidney function  . Colon cancer ([place])    a. recurrence 2016 with liver mets -   percutaneous liver biopsy and thermal ablation of a liver metastasis by interventional radiology..  . Colon polyp   . Depression   . Diabetes mellitus without complication ([place]) [id number]   borderline type II; takes no medication for it  . DVT of deep femoral vein ([place])   . Family history of bladder cancer   . Family history of breast cancer   . Family history of colon cancer   . Fibromyalgia   . GERD (gastroesophageal reflux disease)   . Headache   . Hearing loss   . Hernia   . History of blood clots   . History of PSVT (paroxysmal supraventricular tachycardia)   . Hyperlipidemia   . Hypertension   . Ileostomy present ([place])   . Iron deficiency anemia due to chronic blood loss 07/06/2013  . LBBB (left bundle branch block)   . Obstruction of intestine or colon 06/2014  . Sinus bradycardia    a. HR 30s in 08/2014 requiring holding of digoxin.  Marland Kitchen Thyroid disease   . UTI (urinary tract infection)   . Vision abnormalities 2016   not going blind but having retina problems; might lose ability to see faces  . Wears dentures     Patient Active Problem List   Diagnosis Date Noted  . Achalasia 01/10/2016  .  Odynophagia 01/10/2016  . SBO (small bowel obstruction) 11/05/2015  . GERD (gastroesophageal reflux disease) 11/05/2015  . HTN (hypertension) 11/05/2015  . Hypomagnesemia 07/16/2015  . CKD (chronic kidney disease) stage 3, GFR 30-59 ml/min 06/13/2015  . AKI (acute kidney injury) ([place]) 06/12/2015  . Urinary tract infection 06/12/2015  . Hypotension 06/12/2015  . Diarrhea 06/12/2015  . Elevated troponin 06/12/2015  . Counseling regarding end of life decision making 04/17/2015  . Neoplasm related pain 04/17/2015  . Genetic testing 03/23/2015  . Family history of colon cancer   . Family history of bladder cancer   . Family history of breast cancer   . Sinus bradycardia 09/06/2014  . Liver lesion   . Colon cancer metastasized to liver ([place]) 09/01/2014  . Liver mass, left lobe   .  Acute abdominal pain 06/15/2014  . Acute renal failure ([place]) 06/15/2014  . Hyperglycemia 06/15/2014  . UTI (urinary tract infection) 06/15/2014  . Small bowel obstruction 06/15/2014  . Abdominal pain, acute   . Iron deficiency anemia due to chronic blood loss 07/06/2013  . Malabsorption of iron 07/06/2013  . Parastomal hernia 10/19/2012  . Unstable angina ([place]) 05/24/2012  . Ileostomy status ([place]) 06/04/2011  . Paroxysmal SVT (supraventricular tachycardia) ([place]) 05/06/2011  . Dysphagia 12/30/2010  . Small bowel obstruction, partial 09/03/2010  . Left bundle branch block 07/24/2010  . Chest pain, exertional 07/24/2010  . Benign hypertensive heart disease without heart failure 07/24/2010  . Hypothyroid 07/24/2010    Past Surgical History:  Procedure Laterality Date  . ABDOMINAL HYSTERECTOMY    . APPENDECTOMY    . BOTOX INJECTION N/A 09/01/2013   Procedure: BOTOX INJECTION;  Surgeon: Rogene Houston, MD;  Location: [facility] END[facility] SUITE;  Service: Endoscopy;  Laterality: N/A;  . BREAST SURGERY     right breast biopsy  . CARDIAC CATHETERIZATION  12/10/2000   THE LEFT VENTRICLE IS MILDY DILATED. THERE IS MILD TO MODERATE DIFFUSE HYPOKINESIS WITH EF 35%  . CATARACT EXTRACTION W/PHACO  11/13/2011   Procedure: CATARACT EXTRACTION PHACO AND INTRAOCULAR LENS PLACEMENT (IOC);  Surgeon: Tonny Branch, MD;  Location: [facility] ORS;  Service: Ophthalmology;  Laterality: Right;  CDE 12.26  . CATARACT EXTRACTION W/PHACO  12/08/2011   Procedure: CATARACT EXTRACTION PHACO AND INTRAOCULAR LENS PLACEMENT (IOC);  Surgeon: Tonny Branch, MD;  Location: [facility] ORS;  Service: Ophthalmology;  Laterality: Left;  CDE 15.11  . CHOLECYSTECTOMY    . COLON CANCER SURGERY    . COLON SURGERY    . COLONOSCOPY  09/26/2011   Procedure: COLONOSCOPY;  Surgeon: Rogene Houston, MD;  Location: [facility];  Service: Endoscopy;  Laterality: N/A;  215  . CORONARY ANGIOPLASTY    . ESOPHAGOGASTRODUODENOSCOPY  02/13/2011   Procedure:  ESOPHAGOGASTRODUODENOSCOPY (EGD);  Surgeon: Rogene Houston, MD;  Location: [facility];  Service: Endoscopy;  Laterality: N/A;  1030  . ESOPHAGOGASTRODUODENOSCOPY N/A 09/01/2013   Procedure: ESOPHAGOGASTRODUODENOSCOPY (EGD);  Surgeon: Rogene Houston, MD;  Location: [facility];  Service: Endoscopy;  Laterality: N/A;  830  . EYE SURGERY  2014   catarac surgery  . ILEOSTOMY    . ILEOSTOMY    . JOINT REPLACEMENT Left 2008  . liver biopsy and ablation  09/01/14  . ovarian cystectomy 1955    . ROTATOR CUFF REPAIR    . TONSILLECTOMY      OB History    Gravida Para Term Preterm AB Living             4  SAB TAB Ectopic Multiple Live Births                   Home Medications    Prior to Admission medications   Medication Sig Start Date End Date Taking? Authorizing Provider  ALPRAZolam Duanne Moron) 0.5 MG tablet Take 0.5 mg by mouth at bedtime.     Historical Provider, MD  aspirin EC 325 MG tablet Take 1 tablet (325 mg total) by mouth daily. 01/21/16   Rogene Houston, MD  Calcium Carbonate (CALTRATE 600 PO) Take 1 tablet by mouth daily.     Historical Provider, MD  carvedilol (COREG) 12.5 MG tablet Take 1.5 tablets (18.75 mg total) by mouth 2 (two) times daily with a meal. 10/25/15   Peter M Martinique, MD  fentaNYL (DURAGESIC - DOSED MCG/HR) 25 MCG/HR patch Place 1 patch (25 mcg total) onto the skin every 3 (three) days. 12/28/15   Patrici Ranks, MD  HYDROcodone-acetaminophen (NORCO) 7.5-325 MG tablet Take 1 tablet by mouth every 4 (four) hours as needed. Patient taking differently: Take 1 tablet by mouth every 4 (four) hours as needed for moderate pain.  12/27/15   Patrici Ranks, MD  hyoscyamine (LEVSIN SL) 0.125 MG SL tablet Place 1 tablet (0.125 mg total) under the tongue 3 (three) times daily as needed (lower abdominal pain). 09/18/15   Rogene Houston, MD  IRINOTECAN HCL IV Inject into the vein. Weekly    Historical Provider, MD  isosorbide mononitrate (IMDUR) 30 MG 24 hr tablet  Take 15 mg by mouth daily. 10/31/15   Historical Provider, MD  levothyroxine (SYNTHROID, LEVOTHROID) 75 MCG tablet Take 75 mcg by mouth daily.     Historical Provider, MD  loperamide (IMODIUM A-D) 2 MG tablet Take 2 at diarrhea onset , then 1 every 2hr until 12hrs with no BM. May take 2 every 4hrs at night. If diarrhea recurs repeat. 01/17/16   Patrici Ranks, MD  magnesium oxide (MAG-OX) 400 (241.3 Mg) MG tablet Take 1 tablet (400 mg total) by mouth 4 (four) times daily. 12/17/15   Patrici Ranks, MD  Multiple Vitamins-Minerals (CENTRUM WOMEN PO) Take 1 tablet by mouth daily.     Historical Provider, MD  NITROSTAT 0.4 MG SL tablet DISSOLVE ONE TABLET UNDER THE TONGUE EVERY 5 MINUTES AS NEEDED FOR CHEST PAIN.  DO NOT EXCEED A TOTAL OF 3 DOSES IN 15 MINUTES 11/15/13   Darlin Coco, MD  Omega-3 Fatty Acids (FISH OIL) 1000 MG CAPS Take 1 capsule by mouth daily. Reported on 02/19/2015    Historical Provider, MD  ondansetron (ZOFRAN ODT) 8 MG disintegrating tablet Take 1 tablet (8 mg total) by mouth every 8 (eight) hours as needed for nausea or vomiting. 05/22/15   Baird Cancer, PA-C  pantoprazole (PROTONIX) 40 MG tablet Take 40 mg by mouth daily.     Historical Provider, MD  prochlorperazine (COMPAZINE) 10 MG tablet Take 1 tablet (10 mg total) by mouth every 6 (six) hours as needed for nausea or vomiting. 05/22/15   Baird Cancer, PA-C    Family History Family History  Problem Relation Age of Onset  . Heart disease Mother   . Stroke Mother     deceased  . Arthritis Mother   . Bladder Cancer Mother     dx in her 76s  . Colon cancer Mother   . Arthritis Father   . Heart disease Father     decesaed  . Leukemia Father   .  Stroke Sister     alive/debilitated  . Hypertension Sister   . Other Sister     paralysis  . Heart disease Brother     bypass surgery  . Arthritis Brother   . Colon cancer Brother   . Bladder Cancer Brother   . Stroke Sister     alive/debilitated  . Diabetes  Sister   . Other Brother     stomach problems  . Other Brother     bladder  . Colon cancer Paternal Aunt   . Bladder Cancer Other     dx twice in her 30s-40s  . Breast cancer Other     great niece dx in her early 29s  . Prostate cancer Other     newphew  . Colon polyps Daughter   . Stroke    . Hypertension      Social History Social History  Substance Use Topics  . Smoking status: Former Smoker    Packs/day: 0.25    Years: 13.00    Types: Cigarettes    Quit date: 02/11/1963  . Smokeless tobacco: Never Used  . Alcohol use No     Allergies   Bactrim [sulfamethoxazole-trimethoprim]; Codeine; Demerol; Lidocaine hcl; Morphine and related; Dilaudid [hydromorphone hcl]; Nitrofurantoin monohyd macro; Lisinopril; and Ramipril   Review of Systems Review of Systems  Constitutional: Negative for activity change, appetite change and fever.  HENT: Negative for congestion and rhinorrhea.   Respiratory: Positive for chest tightness and shortness of breath.   Cardiovascular: Positive for chest pain.  Gastrointestinal: Positive for nausea. Negative for abdominal pain and vomiting.  Genitourinary: Negative for dysuria and hematuria.  Musculoskeletal: Negative for arthralgias and myalgias.  Skin: Negative for wound.  Neurological: Negative for dizziness, weakness, light-headedness and headaches.   A complete 10 system review of systems was obtained and all systems are negative except as noted in the HPI and PMH.    Physical Exam Updated Vital Signs BP 159/71   Pulse 72   Temp 98.3 F (36.8 C) (Oral)   Resp 14   Ht 5' (1.524 m)   Wt 134 lb (60.8 kg)   SpO2 98%   BMI 26.17 kg/m   Physical Exam  Constitutional: She is oriented to person, place, and time. She appears well-developed and well-nourished. No distress.  HENT:  Head: Normocephalic and atraumatic.  Mouth/Throat: Oropharynx is clear and moist. No oropharyngeal exudate.  Eyes: Conjunctivae and EOM are normal. Pupils  are equal, round, and reactive to light.  Neck: Normal range of motion. Neck supple.  No meningismus.  Cardiovascular: Normal rate, regular rhythm, normal heart sounds and intact distal pulses.   No murmur heard. Pulmonary/Chest: Effort normal and breath sounds normal. No respiratory distress. She exhibits no tenderness.  Abdominal: Soft. There is no tenderness. There is no rebound and no guarding.  RLQ colostomy  Musculoskeletal: Normal range of motion. She exhibits no edema or tenderness.  Neurological: She is alert and oriented to person, place, and time. No cranial nerve deficit. She exhibits normal muscle tone. Coordination normal.  No ataxia on finger to nose bilaterally. No pronator drift. 5/5 strength throughout. CN 2-12 intact.Equal grip strength. Sensation intact.   Skin: Skin is warm.  Psychiatric: She has a normal mood and affect. Her behavior is normal.  Nursing note and vitals reviewed.    ED Treatments / Results  Labs (all labs ordered are listed, but only abnormal results are displayed) Labs Reviewed  BASIC METABOLIC PANEL - Abnormal; Notable for the  following:       Result Value   Calcium 7.5 (*)    All other components within normal limits  CBC - Abnormal; Notable for the following:    RBC 3.53 (*)    Hemoglobin 10.6 (*)    HCT 32.3 (*)    All other components within normal limits  TROPONIN I - Abnormal; Notable for the following:    Troponin I 0.03 (*)    All other components within normal limits  HEPATIC FUNCTION PANEL - Abnormal; Notable for the following:    Total Protein 6.1 (*)    Albumin 3.4 (*)    Bilirubin, Direct <0.1 (*)    All other components within normal limits  LIPASE, BLOOD    EKG  EKG Interpretation  Date/Time:  Thursday January 24 2016 03:36:18 EST Ventricular Rate:  75 PR Interval:    QRS Duration: 128 QT Interval:  412 QTC Calculation: 461 R Axis:   23 Text Interpretation:  Sinus rhythm Left bundle branch block No significant  change was found Confirmed by Wyvonnia Dusky  MD, Elia Nunley [phone]) on 01/24/2016 3:51:55 AM       Radiology Dg Chest 2 View  Result Date: 01/24/2016 CLINICAL DATA:  82 year old female with chest pain and palpitation. EXAM: CHEST  2 VIEW COMPARISON:  Chest CT dated 01/09/2016 FINDINGS: Mild emphysematous changes of the lungs. There is no focal consolidation, pleural effusion, or pneumothorax. Pulmonary nodules seen on the recent CT is not visualized on this radiograph. A 15 mm nodular appearing density over the right cardiac silhouette most likely represents a vessel on end. There is mild cardiomegaly. There is osteopenia with degenerative changes of the spine. No acute fracture. IMPRESSION: No active cardiopulmonary disease. Electronically Signed   By: Anner Crete M.D.   On: 01/24/2016 04:34    Procedures Procedures (including critical care time)  Medications Ordered in ED Medications - No data to display   Initial Impression / Assessment and Plan / ED Course  I have reviewed the triage vital signs and the nursing notes.  Pertinent labs & imaging results that were available during my care of the patient were reviewed by me and considered in my medical decision making (see chart for details).  Clinical Course    Episode of chest/throat pain. Now resolved after NTG. EKG with unchanged LBBB.  No further episodes of chest pain in the ED.  Troponin minimally elevated, similar to previous.  LFTs and lipase reassuring Labs with stable anemia.  Heart score 6.  Patient with GI issues as well including achalasia and GERD.  However given presentation and risk factors, and pain resolution with nitroglycerin, will admit for observation.  D/w Dr. Darrick Meigs.  Final Clinical Impressions(s) / ED Diagnoses   Final diagnoses:  Chest pain, unspecified type    New Prescriptions New Prescriptions   No medications on file     Ezequiel Essex, MD 01/24/16 [phone]

## 2016-01-24 NOTE — Progress Notes (Signed)
Patient with orders to be discharge home. Discharge instructions given, patient verbalized understanding. Patient stable. Patient left in private vehicle with family.  

## 2016-01-24 NOTE — Discharge Summary (Signed)
Physician Discharge Summary  Karen [name] [id number] DOB: [birth year] DOA: 01/24/2016  PCP: [name], MD  Admit date: 01/24/2016 Discharge date: 01/24/2016  Time spent: 45 minutes  Recommendations for Outpatient Follow-up:  -Will be discharged home today. -Advised to follow up with PCP in 2 weeks.   Discharge Diagnoses:  Active Problems:   Chest pain   Discharge Condition: Stable and improved  Filed Weights   01/24/16 0336 01/24/16 1237  Weight: 60.8 kg (134 lb) 60.8 kg (134 lb)    History of present illness:  As per Dr. Darrick Meigs on 12/14: Karen [name]  is a 82 y.o. female, With history of metastatic colon carcinoma, who underwent EGD with balloon dilation and Botox injection on 01/18/2016 for dysphagia secondary to achalasia. Today came to the ED after patient developed chest pain last night. Pain started and central chest with radiation to her throat and left shoulder. There was also associated shortness of breath and nausea. Pain lasted for about 45 minutes and resolved after she took nitroglycerin. Patient said that she has had throat pain with exertion for quite some time. Her last stress test was in 2014. She complains of some sweating with the chest pain which is now resolved. The ED patient is currently chest pain-free, cardiac enzymes were negative.  She denies nausea vomiting or diarrhea. No fever, dizziness.  Hospital Course:   Chest Pain -2 troponins are negative. Patient refuses to wait for third set and would like to be discharged home this evening. -EKG without acute ischemic changes. -Suspect CP related to known GI issues with achalasia. -Was in hospital for <24 hours.  Rest of chronic conditions have been stable  Procedures:  None   Consultations:  None  Discharge Instructions  Discharge Instructions    Diet - low sodium heart healthy    Complete by:  As directed    Increase activity slowly    Complete by:  As directed       Allergies as of 01/24/2016      Reactions   Bactrim [sulfamethoxazole-trimethoprim] Shortness Of Breath, Other (See Comments)   Hurting all over   Codeine Palpitations, Other (See Comments)   Heart races   Demerol Palpitations, Other (See Comments)   tachycardia   Lidocaine Hcl Other (See Comments)   tachycardia   Morphine And Related Palpitations   Tachycardia   Dilaudid [hydromorphone Hcl] Other (See Comments)   Hallucinations   Nitrofurantoin Monohyd Macro Other (See Comments)   Unknown   Lisinopril Cough   Ramipril Cough      Medication List    STOP taking these medications   CALTRATE 600 PO   cephALEXin 500 MG capsule Commonly known as:  KEFLEX   loperamide 2 MG tablet Commonly known as:  IMODIUM A-D     TAKE these medications   ALPRAZolam 0.5 MG tablet Commonly known as:  XANAX Take 0.5 mg by mouth at bedtime.   aspirin EC 325 MG tablet Take 1 tablet (325 mg total) by mouth daily.   carvedilol 12.5 MG tablet Commonly known as:  COREG Take 1.5 tablets (18.75 mg total) by mouth 2 (two) times daily with a meal.   CENTRUM WOMEN PO Take 1 tablet by mouth daily.   fentaNYL 25 MCG/HR patch Commonly known as:  DURAGESIC - dosed mcg/hr Place 1 patch (25 mcg total) onto the skin every 3 (three) days.   Fish Oil 1000 MG Caps Take 1 capsule by mouth daily. Reported on 02/19/2015  HYDROcodone-acetaminophen 7.5-325 MG tablet Commonly known as:  NORCO Take 1 tablet by mouth every 4 (four) hours as needed. What changed:  reasons to take this   hyoscyamine 0.125 MG SL tablet Commonly known as:  LEVSIN SL Place 1 tablet (0.125 mg total) under the tongue 3 (three) times daily as needed (lower abdominal pain).   IRINOTECAN HCL IV Inject into the vein. Weekly   isosorbide mononitrate 30 MG 24 hr tablet Commonly known as:  IMDUR Take 15 mg by mouth daily.   levothyroxine 75 MCG tablet Commonly known as:  SYNTHROID, LEVOTHROID Take 75 mcg by mouth daily.    magnesium oxide 400 (241.3 Mg) MG tablet Commonly known as:  MAG-OX Take 1 tablet (400 mg total) by mouth 4 (four) times daily.   NITROSTAT 0.4 MG SL tablet Generic drug:  nitroGLYCERIN DISSOLVE ONE TABLET UNDER THE TONGUE EVERY 5 MINUTES AS NEEDED FOR CHEST PAIN.  DO NOT EXCEED A TOTAL OF 3 DOSES IN 15 MINUTES   pantoprazole 40 MG tablet Commonly known as:  PROTONIX Take 40 mg by mouth daily.      Allergies  Allergen Reactions  . Bactrim [Sulfamethoxazole-Trimethoprim] Shortness Of Breath and Other (See Comments)    Hurting all over  . Codeine Palpitations and Other (See Comments)    Heart races  . Demerol Palpitations and Other (See Comments)    tachycardia  . Lidocaine Hcl Other (See Comments)    tachycardia  . Morphine And Related Palpitations    Tachycardia   . Dilaudid [Hydromorphone Hcl] Other (See Comments)    Hallucinations  . Nitrofurantoin Monohyd Macro Other (See Comments)    Unknown   . Lisinopril Cough  . Ramipril Cough   Follow-up Information    [name], MD. Schedule an appointment as soon as possible for a visit in 2 week(s).   Specialty:  Family Medicine Contact information: [place] [address] [phone]            The results of significant diagnostics from this hospitalization (including imaging, microbiology, ancillary and laboratory) are listed below for reference.    Significant Diagnostic Studies: Dg Chest 2 View  Result Date: 01/24/2016 CLINICAL DATA:  82 year old female with chest pain and palpitation. EXAM: CHEST  2 VIEW COMPARISON:  Chest CT dated 01/09/2016 FINDINGS: Mild emphysematous changes of the lungs. There is no focal consolidation, pleural effusion, or pneumothorax. Pulmonary nodules seen on the recent CT is not visualized on this radiograph. A 15 mm nodular appearing density over the right cardiac silhouette most likely represents a vessel on end. There is mild cardiomegaly. There is osteopenia with  degenerative changes of the spine. No acute fracture. IMPRESSION: No active cardiopulmonary disease. Electronically Signed   By: Anner Crete M.D.   On: 01/24/2016 04:34   Ct Chest W Contrast  Result Date: 01/09/2016 CLINICAL DATA:  Colorectal carcinoma with liver metastasis. Subsequent treatment evaluation. EXAM: CT CHEST, ABDOMEN, AND PELVIS WITH CONTRAST TECHNIQUE: Multidetector CT imaging of the chest, abdomen and pelvis was performed following the standard protocol during bolus administration of intravenous contrast. CONTRAST:  60mL ISOVUE-300 IOPAMIDOL (ISOVUE-300) INJECTION 61%, 121mL ISOVUE-300 IOPAMIDOL (ISOVUE-300) INJECTION 61% COMPARISON:  CT 11/05/2015, PET-CT 01/18/2015 FINDINGS: CT CHEST FINDINGS Cardiovascular: Coronary artery calcification and aortic atherosclerotic calcification. Mediastinum/Nodes: No axillary supraclavicular adenopathy. No mediastinal hilar adenopathy. No pericardial fluid esophagus. Lungs/Pleura: 4 mm subpleural nodule in the RIGHT upper lobe (image 47, series 3) is increased from 3 mm on prior. LEFT lower lobe  2 mm nodule image 105, series 3 is unchanged. Musculoskeletal: No aggressive osseous lesion. CT ABDOMEN AND PELVIS FINDINGS Hepatobiliary: New lesion in the LEFT lateral hepatic lobe measures 3.3 by 2.6 cm and bulges the capsule (image 57, series 2). This new lesion is inferior to the previously described hypodense lesion measuring 3.0 cm which is stable compared to prior. No additional new lesions are identified. Portal veins are patent. Pancreas: Pancreas is normal. No ductal dilatation. No pancreatic inflammation. Spleen: Normal spleen Adrenals/urinary tract: Adrenal glands and kidneys are normal. The ureters and bladder normal. Stomach/Bowel: Stomach, duodenum small bowel are normal. There is a RIGHT lateral abdominal wall laxity. There is a RIGHT lower quadrant ileostomy without obstruction. Along the descending colon there is a lobular mass measuring 3.1 by  2.8 cm which compares to 3.3 x 3.1 cm on CT of 09/24/2015. This lesion was hypermetabolic on comparison PET-CT scan. Vascular/Lymphatic: Abdominal aorta is normal caliber. There is no retroperitoneal or periportal lymphadenopathy. No pelvic lymphadenopathy. Reproductive: Post hysterectomy Other: No peritoneal metastasis identified Musculoskeletal: No aggressive osseous lesion. IMPRESSION: Chest Impression: 1. Interval increase in size and prominence of RIGHT upper lobe pulmonary nodule measuring 4 mm is highly concerning for pulmonary metastasis. 2. No mediastinal lymphadenopathy Abdomen / Pelvis Impression: 1. New enhancing lesion in the LEFT lateral hepatic lobe consistent with progression of hepatic metastasis. 2. Stable mass along the descending colon was hypermetabolic on comparison PET-CT scan. 3. RIGHT lower quadrant ileostomy without complication. Electronically Signed   By: Suzy Bouchard M.D.   On: 01/09/2016 17:23   Ct Abdomen Pelvis W Contrast  Result Date: 01/09/2016 CLINICAL DATA:  Colorectal carcinoma with liver metastasis. Subsequent treatment evaluation. EXAM: CT CHEST, ABDOMEN, AND PELVIS WITH CONTRAST TECHNIQUE: Multidetector CT imaging of the chest, abdomen and pelvis was performed following the standard protocol during bolus administration of intravenous contrast. CONTRAST:  11mL ISOVUE-300 IOPAMIDOL (ISOVUE-300) INJECTION 61%, 134mL ISOVUE-300 IOPAMIDOL (ISOVUE-300) INJECTION 61% COMPARISON:  CT 11/05/2015, PET-CT 01/18/2015 FINDINGS: CT CHEST FINDINGS Cardiovascular: Coronary artery calcification and aortic atherosclerotic calcification. Mediastinum/Nodes: No axillary supraclavicular adenopathy. No mediastinal hilar adenopathy. No pericardial fluid esophagus. Lungs/Pleura: 4 mm subpleural nodule in the RIGHT upper lobe (image 47, series 3) is increased from 3 mm on prior. LEFT lower lobe 2 mm nodule image 105, series 3 is unchanged. Musculoskeletal: No aggressive osseous lesion. CT  ABDOMEN AND PELVIS FINDINGS Hepatobiliary: New lesion in the LEFT lateral hepatic lobe measures 3.3 by 2.6 cm and bulges the capsule (image 57, series 2). This new lesion is inferior to the previously described hypodense lesion measuring 3.0 cm which is stable compared to prior. No additional new lesions are identified. Portal veins are patent. Pancreas: Pancreas is normal. No ductal dilatation. No pancreatic inflammation. Spleen: Normal spleen Adrenals/urinary tract: Adrenal glands and kidneys are normal. The ureters and bladder normal. Stomach/Bowel: Stomach, duodenum small bowel are normal. There is a RIGHT lateral abdominal wall laxity. There is a RIGHT lower quadrant ileostomy without obstruction. Along the descending colon there is a lobular mass measuring 3.1 by 2.8 cm which compares to 3.3 x 3.1 cm on CT of 09/24/2015. This lesion was hypermetabolic on comparison PET-CT scan. Vascular/Lymphatic: Abdominal aorta is normal caliber. There is no retroperitoneal or periportal lymphadenopathy. No pelvic lymphadenopathy. Reproductive: Post hysterectomy Other: No peritoneal metastasis identified Musculoskeletal: No aggressive osseous lesion. IMPRESSION: Chest Impression: 1. Interval increase in size and prominence of RIGHT upper lobe pulmonary nodule measuring 4 mm is highly concerning for pulmonary metastasis. 2. No mediastinal  lymphadenopathy Abdomen / Pelvis Impression: 1. New enhancing lesion in the LEFT lateral hepatic lobe consistent with progression of hepatic metastasis. 2. Stable mass along the descending colon was hypermetabolic on comparison PET-CT scan. 3. RIGHT lower quadrant ileostomy without complication. Electronically Signed   By: Suzy Bouchard M.D.   On: 01/09/2016 17:23    Microbiology: No results found for this or any previous visit (from the past 240 hour(s)).   Labs: Basic Metabolic Panel:  Recent Labs Lab 01/24/16 0343  NA 142  K 3.6  CL 110  CO2 25  GLUCOSE 94  BUN 19    CREATININE 0.72  CALCIUM 7.5*   Liver Function Tests:  Recent Labs Lab 01/24/16 0343  AST 24  ALT 16  ALKPHOS 60  BILITOT 0.3  PROT 6.1*  ALBUMIN 3.4*    Recent Labs Lab 01/24/16 0343  LIPASE 32   No results for input(s): AMMONIA in the last 168 hours. CBC:  Recent Labs Lab 01/24/16 0343  WBC 5.5  HGB 10.6*  HCT 32.3*  MCV 91.5  PLT 180   Cardiac Enzymes:  Recent Labs Lab 01/24/16 0343 01/24/16 1149 01/24/16 1659  TROPONINI 0.03* 0.03* 0.03*   BNP: BNP (last 3 results) No results for input(s): BNP in the last 8760 hours.  ProBNP (last 3 results) No results for input(s): PROBNP in the last 8760 hours.  CBG: No results for input(s): GLUCAP in the last 168 hours.     SignedLelon Frohlich  [facility] Pager: [phone] 01/24/2016, 5:40 PM

## 2016-01-24 NOTE — ED Triage Notes (Signed)
Patient states that she was hurting in her chest and in went into her neck and her left shoulder.  States that she took one nitro and it helped.  All the pain went away after taking the one nitro.

## 2016-01-24 NOTE — H&P (Signed)
TRH H&P    Patient Demographics:    Karen [name], is a 82 y.o. female  MRN: [id number]  DOB - [birth year]  Admit Date - 01/24/2016  Referring MD/NP/PA: Dr Wyvonnia Dusky  Outpatient Primary MD for the patient is [name], MD  Patient coming from: Home  Chief Complaint  Patient presents with  . Chest Pain      HPI:    Karen [name]  is a 82 y.o. female, With history of metastatic colon carcinoma, who underwent EGD with balloon dilation and Botox injection on 01/18/2016 for dysphagia secondary to achalasia. Today came to the ED after patient developed chest pain last night. Pain started and central chest with radiation to her throat and left shoulder. There was also associated shortness of breath and nausea. Pain lasted for about 45 minutes and resolved after she took nitroglycerin. Patient said that she has had throat pain with exertion for quite some time. Her last stress test was in 2014. She complains of some sweating with the chest pain which is now resolved. The ED patient is currently chest pain-free, cardiac enzymes were negative.  She denies nausea vomiting or diarrhea. No fever, dizziness.    Review of systems:    In addition to the HPI above,  No Fever-chills, No Headache, No changes with Vision or hearing, No problems swallowing food or Liquids, No Blood in stool or Urine, No dysuria, No new skin rashes or bruises, No new joints pains-aches,  No new weakness, tingling, numbness in any extremity, No recent weight gain or loss, No polyuria, polydypsia or polyphagia, No significant Mental Stressors.  A full 10 point Review of Systems was done, except as stated above, all other Review of Systems were negative.   With Past History of the following :    Past Medical History:  Diagnosis Date  . Abdominal adhesions   . Achalasia    a. s/p botox injection for achalasia.  . Anemia   .  Arthritis   . Asthma    hx of  . Blood transfusion   . Breast lump   . Cancer ([place])    bladder  . Chest pain    a. 2002 Cath: nl cors;  b. 2009 aden mv: nl;  c. 05/2009 Echo: nl. d. 2014: normal nuclear stress test, EF 69%.  . Chronic diastolic CHF (congestive heart failure) (HCC)    a. nl EF by echo 05/2009.  . CKD (chronic kidney disease), stage III    pt. states decreased kidney function  . Colon cancer ([place])    a. recurrence 2016 with liver mets -  percutaneous liver biopsy and thermal ablation of a liver metastasis by interventional radiology..  . Colon polyp   . Depression   . Diabetes mellitus without complication ([place]) [id number]   borderline type II; takes no medication for it  . DVT of deep femoral vein ([place])   . Family history of bladder cancer   . Family history of breast cancer   . Family history of colon cancer   . Fibromyalgia   .  GERD (gastroesophageal reflux disease)   . Headache   . Hearing loss   . Hernia   . History of blood clots   . History of PSVT (paroxysmal supraventricular tachycardia)   . Hyperlipidemia   . Hypertension   . Ileostomy present ([place])   . Iron deficiency anemia due to chronic blood loss 07/06/2013  . LBBB (left bundle branch block)   . Obstruction of intestine or colon 06/2014  . Sinus bradycardia    a. HR 30s in 08/2014 requiring holding of digoxin.  Marland Kitchen Thyroid disease   . UTI (urinary tract infection)   . Vision abnormalities 2016   not going blind but having retina problems; might lose ability to see faces  . Wears dentures       Past Surgical History:  Procedure Laterality Date  . ABDOMINAL HYSTERECTOMY    . APPENDECTOMY    . BOTOX INJECTION N/A 09/01/2013   Procedure: BOTOX INJECTION;  Surgeon: Rogene Houston, MD;  Location: [facility];  Service: Endoscopy;  Laterality: N/A;  . BREAST SURGERY     right breast biopsy  . CARDIAC CATHETERIZATION  12/10/2000   THE LEFT VENTRICLE IS MILDY DILATED. THERE IS MILD TO MODERATE  DIFFUSE HYPOKINESIS WITH EF 35%  . CATARACT EXTRACTION W/PHACO  11/13/2011   Procedure: CATARACT EXTRACTION PHACO AND INTRAOCULAR LENS PLACEMENT (IOC);  Surgeon: Tonny Branch, MD;  Location: [facility];  Service: Ophthalmology;  Laterality: Right;  CDE 12.26  . CATARACT EXTRACTION W/PHACO  12/08/2011   Procedure: CATARACT EXTRACTION PHACO AND INTRAOCULAR LENS PLACEMENT (IOC);  Surgeon: Tonny Branch, MD;  Location: [facility];  Service: Ophthalmology;  Laterality: Left;  CDE 15.11  . CHOLECYSTECTOMY    . COLON CANCER SURGERY    . COLON SURGERY    . COLONOSCOPY  09/26/2011   Procedure: COLONOSCOPY;  Surgeon: Rogene Houston, MD;  Location: [facility];  Service: Endoscopy;  Laterality: N/A;  215  . CORONARY ANGIOPLASTY    . ESOPHAGOGASTRODUODENOSCOPY  02/13/2011   Procedure: ESOPHAGOGASTRODUODENOSCOPY (EGD);  Surgeon: Rogene Houston, MD;  Location: [facility];  Service: Endoscopy;  Laterality: N/A;  1030  . ESOPHAGOGASTRODUODENOSCOPY N/A 09/01/2013   Procedure: ESOPHAGOGASTRODUODENOSCOPY (EGD);  Surgeon: Rogene Houston, MD;  Location: [facility];  Service: Endoscopy;  Laterality: N/A;  830  . EYE SURGERY  2014   catarac surgery  . ILEOSTOMY    . ILEOSTOMY    . JOINT REPLACEMENT Left 2008  . liver biopsy and ablation  09/01/14  . ovarian cystectomy 1955    . ROTATOR CUFF REPAIR    . TONSILLECTOMY        Social History:      Social History  Substance Use Topics  . Smoking status: Former Smoker    Packs/day: 0.25    Years: 13.00    Types: Cigarettes    Quit date: 02/11/1963  . Smokeless tobacco: Never Used  . Alcohol use No       Family History :     Family History  Problem Relation Age of Onset  . Heart disease Mother   . Stroke Mother     deceased  . Arthritis Mother   . Bladder Cancer Mother     dx in her 10s  . Colon cancer Mother   . Arthritis Father   . Heart disease Father     decesaed  . Leukemia Father   . Stroke Sister     alive/debilitated  . Hypertension  Sister   .  Other Sister     paralysis  . Heart disease Brother     bypass surgery  . Arthritis Brother   . Colon cancer Brother   . Bladder Cancer Brother   . Stroke Sister     alive/debilitated  . Diabetes Sister   . Other Brother     stomach problems  . Other Brother     bladder  . Colon cancer Paternal Aunt   . Bladder Cancer Other     dx twice in her 30s-40s  . Breast cancer Other     great niece dx in her early 45s  . Prostate cancer Other     newphew  . Colon polyps Daughter   . Stroke    . Hypertension        Home Medications:   Prior to Admission medications   Medication Sig Start Date End Date Taking? Authorizing Provider  ALPRAZolam Duanne Moron) 0.5 MG tablet Take 0.5 mg by mouth at bedtime.     Historical Provider, MD  aspirin EC 325 MG tablet Take 1 tablet (325 mg total) by mouth daily. 01/21/16   Rogene Houston, MD  Calcium Carbonate (CALTRATE 600 PO) Take 1 tablet by mouth daily.     Historical Provider, MD  carvedilol (COREG) 12.5 MG tablet Take 1.5 tablets (18.75 mg total) by mouth 2 (two) times daily with a meal. 10/25/15   Peter M Martinique, MD  fentaNYL (DURAGESIC - DOSED MCG/HR) 25 MCG/HR patch Place 1 patch (25 mcg total) onto the skin every 3 (three) days. 12/28/15   Patrici Ranks, MD  HYDROcodone-acetaminophen (NORCO) 7.5-325 MG tablet Take 1 tablet by mouth every 4 (four) hours as needed. Patient taking differently: Take 1 tablet by mouth every 4 (four) hours as needed for moderate pain.  12/27/15   Patrici Ranks, MD  hyoscyamine (LEVSIN SL) 0.125 MG SL tablet Place 1 tablet (0.125 mg total) under the tongue 3 (three) times daily as needed (lower abdominal pain). 09/18/15   Rogene Houston, MD  IRINOTECAN HCL IV Inject into the vein. Weekly    Historical Provider, MD  isosorbide mononitrate (IMDUR) 30 MG 24 hr tablet Take 15 mg by mouth daily. 10/31/15   Historical Provider, MD  levothyroxine (SYNTHROID, LEVOTHROID) 75 MCG tablet Take 75 mcg by mouth  daily.     Historical Provider, MD  loperamide (IMODIUM A-D) 2 MG tablet Take 2 at diarrhea onset , then 1 every 2hr until 12hrs with no BM. May take 2 every 4hrs at night. If diarrhea recurs repeat. 01/17/16   Patrici Ranks, MD  magnesium oxide (MAG-OX) 400 (241.3 Mg) MG tablet Take 1 tablet (400 mg total) by mouth 4 (four) times daily. 12/17/15   Patrici Ranks, MD  Multiple Vitamins-Minerals (CENTRUM WOMEN PO) Take 1 tablet by mouth daily.     Historical Provider, MD  NITROSTAT 0.4 MG SL tablet DISSOLVE ONE TABLET UNDER THE TONGUE EVERY 5 MINUTES AS NEEDED FOR CHEST PAIN.  DO NOT EXCEED A TOTAL OF 3 DOSES IN 15 MINUTES 11/15/13   Darlin Coco, MD  Omega-3 Fatty Acids (FISH OIL) 1000 MG CAPS Take 1 capsule by mouth daily. Reported on 02/19/2015    Historical Provider, MD  ondansetron (ZOFRAN ODT) 8 MG disintegrating tablet Take 1 tablet (8 mg total) by mouth every 8 (eight) hours as needed for nausea or vomiting. 05/22/15   Baird Cancer, PA-C  pantoprazole (PROTONIX) 40 MG tablet Take 40 mg by mouth daily.  Historical Provider, MD  prochlorperazine (COMPAZINE) 10 MG tablet Take 1 tablet (10 mg total) by mouth every 6 (six) hours as needed for nausea or vomiting. 05/22/15   Baird Cancer, PA-C     Allergies:     Allergies  Allergen Reactions  . Bactrim [Sulfamethoxazole-Trimethoprim] Shortness Of Breath and Other (See Comments)    Hurting all over  . Codeine Palpitations and Other (See Comments)    Heart races  . Demerol Palpitations and Other (See Comments)    tachycardia  . Lidocaine Hcl Other (See Comments)    tachycardia  . Morphine And Related Palpitations    Tachycardia   . Dilaudid [Hydromorphone Hcl] Other (See Comments)    Hallucinations  . Nitrofurantoin Monohyd Macro Other (See Comments)    Unknown   . Lisinopril Cough  . Ramipril Cough     Physical Exam:   Vitals  Blood pressure 178/67, pulse 65, temperature 98.3 F (36.8 C), temperature source  Oral, resp. rate 16, height 5' (1.524 m), weight 60.8 kg (134 lb), SpO2 97 %.  1.  General: Elderly Caucasian female in no acute distress  2. Psychiatric:  Intact judgement and  insight, awake alert, oriented x 3.  3. Neurologic: No focal neurological deficits, all cranial nerves intact.Strength 5/5 all 4 extremities, sensation intact all 4 extremities, plantars down going.  4. Eyes :  anicteric sclerae, moist conjunctivae with no lid lag. PERRLA.  5. ENMT:  Oropharynx clear with moist mucous membranes and good dentition  6. Neck:  supple, no cervical lymphadenopathy appriciated, No thyromegaly  7. Respiratory : Normal respiratory effort, good air movement bilaterally,clear to  auscultation bilaterally  8. Cardiovascular : RRR, no gallops, rubs or murmurs, no leg edema  9. Gastrointestinal:  Positive bowel sounds, abdomen soft, non-tender to palpation,no hepatosplenomegaly, no rigidity or guarding       10. Skin:  No cyanosis, normal texture and turgor, no rash, lesions or ulcers  11.Musculoskeletal:  Good muscle tone,  joints appear normal , no effusions,  normal range of motion    Data Review:    CBC  Recent Labs Lab 01/24/16 0343  WBC 5.5  HGB 10.6*  HCT 32.3*  PLT 180  MCV 91.5  MCH 30.0  MCHC 32.8  RDW 13.0   ------------------------------------------------------------------------------------------------------------------  Chemistries   Recent Labs Lab 01/24/16 0343  NA 142  K 3.6  CL 110  CO2 25  GLUCOSE 94  BUN 19  CREATININE 0.72  CALCIUM 7.5*  AST 24  ALT 16  ALKPHOS 60  BILITOT 0.3   ------------------------------------------------------------------------------------------------------------------  ------------------------------------------------------------------------------------------------------------------ GFR: Estimated Creatinine Clearance: 42.6 mL/min (by C-G formula based on SCr of 0.72 mg/dL). Liver Function  Tests:  Recent Labs Lab 01/24/16 0343  AST 24  ALT 16  ALKPHOS 60  BILITOT 0.3  PROT 6.1*  ALBUMIN 3.4*    Recent Labs Lab 01/24/16 0343  LIPASE 32   No results for input(s): AMMONIA in the last 168 hours. Coagulation Profile: No results for input(s): INR, PROTIME in the last 168 hours. Cardiac Enzymes:  Recent Labs Lab 01/24/16 0343  TROPONINI 0.03*    --------------------------------------------------------------------------------------------------------------- Urine analysis:    Component Value Date/Time   COLORURINE YELLOW 11/05/2015 0910   APPEARANCEUR CLEAR 11/05/2015 0910   LABSPEC 1.015 11/05/2015 0910   PHURINE 6.0 11/05/2015 0910   GLUCOSEU NEGATIVE 11/05/2015 0910   HGBUR NEGATIVE 11/05/2015 0910   BILIRUBINUR NEGATIVE 11/05/2015 0910   KETONESUR NEGATIVE 11/05/2015 0910   PROTEINUR NEGATIVE 11/05/2015 0910  UROBILINOGEN 0.2 06/15/2014 0800   NITRITE NEGATIVE 11/05/2015 0910   LEUKOCYTESUR NEGATIVE 11/05/2015 0910      Imaging Results:    Dg Chest 2 View  Result Date: 01/24/2016 CLINICAL DATA:  82 year old female with chest pain and palpitation. EXAM: CHEST  2 VIEW COMPARISON:  Chest CT dated 01/09/2016 FINDINGS: Mild emphysematous changes of the lungs. There is no focal consolidation, pleural effusion, or pneumothorax. Pulmonary nodules seen on the recent CT is not visualized on this radiograph. A 15 mm nodular appearing density over the right cardiac silhouette most likely represents a vessel on end. There is mild cardiomegaly. There is osteopenia with degenerative changes of the spine. No acute fracture. IMPRESSION: No active cardiopulmonary disease. Electronically Signed   By: Anner Crete M.D.   On: 01/24/2016 04:34   EKG- normal sinus rhythm, no ST-T changes   Assessment & Plan:    Active Problems:   Chest pain   1. Chest pain- place under observation, cycle cardiac enzymes. Patient is already on fentanyl patch for pain. 2. History  of achalasia- patient is status post EGD and Botox injection on 01/18/2016. She denies dysphagia at this time. 3. Metastatic colon carcinoma- patient on chemotherapy followed by oncology as outpatient. Continue fentanyl patch for pain 4. Hypertension- blood pressure is stable, continue Coreg, aspirin. 5. Hypothyroidism-continue Synthroid   DVT Prophylaxis-   Lovenox   AM Labs Ordered, also please review Full Orders  Family Communication: Admission, patients condition and plan of care including tests being ordered have been discussed with the patient and Her husband and daughter at bedside who indicate understanding and agree with the plan and Code Status.  Code Status:  Full code  Admission status: Observation    Time spent in minutes : 60 minutes   Joselyne Spake S M.D on 01/24/2016 at 6:31 AM  Between 7am to 7pm - Pager - [phone]. After 7pm go to [email or web address] - password [facility]  [facility] - Office  [phone]

## 2016-01-24 NOTE — ED Notes (Signed)
CRITICAL VALUE ALERT  Critical value received:  Troponin 0.03  Date of notification:  01/24/16  Time of notification:  0422  Critical value read back:Yes.    Nurse who received alert:  Joellyn Rued, RN  MD notified (1st page):  Dr Wyvonnia Dusky  Time of first page:  0422  MD notified (2nd page):  Time of second page:  Responding MD:  Dr Wyvonnia Dusky  Time MD responded:  [phone]

## 2016-01-24 NOTE — Care Management Obs Status (Signed)
[place] NOTIFICATION   Patient Details  Name: TYREONNA LIVING MRN: [id number] Date of Birth: 03-22-1929   Medicare Observation Status Notification Given:  Yes    Sherald Barge, RN 01/24/2016, 3:46 PM

## 2016-01-25 DIAGNOSIS — E039 Hypothyroidism, unspecified: Secondary | ICD-10-CM | POA: Diagnosis not present

## 2016-01-25 DIAGNOSIS — I129 Hypertensive chronic kidney disease with stage 1 through stage 4 chronic kidney disease, or unspecified chronic kidney disease: Secondary | ICD-10-CM | POA: Diagnosis not present

## 2016-01-25 DIAGNOSIS — C189 Malignant neoplasm of colon, unspecified: Secondary | ICD-10-CM | POA: Diagnosis not present

## 2016-01-25 DIAGNOSIS — C787 Secondary malignant neoplasm of liver and intrahepatic bile duct: Secondary | ICD-10-CM | POA: Diagnosis not present

## 2016-01-25 DIAGNOSIS — D509 Iron deficiency anemia, unspecified: Secondary | ICD-10-CM | POA: Diagnosis not present

## 2016-01-25 DIAGNOSIS — L89321 Pressure ulcer of left buttock, stage 1: Secondary | ICD-10-CM | POA: Diagnosis not present

## 2016-01-28 ENCOUNTER — Ambulatory Visit (HOSPITAL_COMMUNITY): Payer: Medicare Other

## 2016-01-28 ENCOUNTER — Ambulatory Visit (HOSPITAL_COMMUNITY): Payer: Medicare Other | Admitting: Oncology

## 2016-01-29 ENCOUNTER — Telehealth (HOSPITAL_COMMUNITY): Payer: Self-pay | Admitting: *Deleted

## 2016-01-29 ENCOUNTER — Other Ambulatory Visit (HOSPITAL_COMMUNITY): Payer: Self-pay | Admitting: Oncology

## 2016-01-29 DIAGNOSIS — C787 Secondary malignant neoplasm of liver and intrahepatic bile duct: Secondary | ICD-10-CM | POA: Diagnosis not present

## 2016-01-29 DIAGNOSIS — D509 Iron deficiency anemia, unspecified: Secondary | ICD-10-CM | POA: Diagnosis not present

## 2016-01-29 DIAGNOSIS — L821 Other seborrheic keratosis: Secondary | ICD-10-CM | POA: Diagnosis not present

## 2016-01-29 DIAGNOSIS — I129 Hypertensive chronic kidney disease with stage 1 through stage 4 chronic kidney disease, or unspecified chronic kidney disease: Secondary | ICD-10-CM | POA: Diagnosis not present

## 2016-01-29 DIAGNOSIS — L853 Xerosis cutis: Secondary | ICD-10-CM | POA: Diagnosis not present

## 2016-01-29 DIAGNOSIS — E039 Hypothyroidism, unspecified: Secondary | ICD-10-CM | POA: Diagnosis not present

## 2016-01-29 DIAGNOSIS — D1801 Hemangioma of skin and subcutaneous tissue: Secondary | ICD-10-CM | POA: Diagnosis not present

## 2016-01-29 DIAGNOSIS — C189 Malignant neoplasm of colon, unspecified: Secondary | ICD-10-CM | POA: Diagnosis not present

## 2016-01-29 DIAGNOSIS — L814 Other melanin hyperpigmentation: Secondary | ICD-10-CM | POA: Diagnosis not present

## 2016-01-29 DIAGNOSIS — L89321 Pressure ulcer of left buttock, stage 1: Secondary | ICD-10-CM | POA: Diagnosis not present

## 2016-01-29 DIAGNOSIS — L720 Epidermal cyst: Secondary | ICD-10-CM | POA: Diagnosis not present

## 2016-01-29 MED ORDER — HYDROCODONE-ACETAMINOPHEN 7.5-325 MG PO TABS: 1.0000 | ORAL_TABLET | ORAL | 0 refills | Status: DC | PRN

## 2016-01-29 MED ORDER — FENTANYL 25 MCG/HR TD PT72
25.0000 ug | MEDICATED_PATCH | TRANSDERMAL | 0 refills | Status: DC
Start: 2016-01-29 — End: 2016-02-29

## 2016-01-31 NOTE — Patient Instructions (Signed)
[place]   CHEMOTHERAPY INSTRUCTIONS  You will receive the following premedications prior to each chemotherapy: Premeds: Aloxi - high powered nausea/vomiting prevention medication used for chemotherapy patients. Dexamethasone - steroid - given to reduce the risk of you having an allergic type reaction to the chemotherapy. Dex can cause you to feel energized, nervous/anxious/jittery, make you have trouble sleeping, and/or make you feel hot/flushed in the face/neck and/or look pink/red in the face/neck. These side effects will pass as the Dex wears off. (takes 20 minutes to infuse)  POTENTIAL SIDE EFFECTS OF TREATMENT:  Irinotecan Hydrochloride (Generic Name) Other Names: Camptosar, camptothecin-11, CPT-11  About this drug Irinotecan hydrochloride is used to treat cancer. This drug is given in the vein (IV).  Possible side effects (more common) . Loose bowel movements (diarrhea) that may last for a few days . Bone marrow depression. This is a decrease in the number of white blood cells, red blood cells, and platelets. This may raise your risk of infection, make you tired and weak (fatigue), and raise your risk of bleeding. . Nausea and throwing up (vomiting). These symptoms may happen within a few hours or many hours after your treatment and may last up to 24 hours. Medicines are available to stop or lessen these side effects. . Hair loss: Most often hair loss is temporary; your hair should grow back when treatment is done.  Possible side effects (less common) . Skin and tissue irritation. You may have redness, pain, warmth, or swelling at the IV site. . Weakness . Trouble Breathing . Decreased Appetite (decreased hunger)  Treating side effects . Drink 6-8 cups of fluids each day unless your doctor has told you to limit your fluid intake due to some other health problem. A cup is 8 ounces of fluid. If you throw up or have loose bowel movements, you  should drink more fluids so that you do not become dehydrated (lack water in the body from losing too much fluid). . Ask your doctor or nurse about medicine that is available to help stop or lessen the loose bowel movements. . Talk with your nurse about getting a wig before you lose your hair. Also, call the [facility] at 800-ACS-2345 to find out information about the "Look Good, Feel Better" program close to where you live. It is a free program where women getting chemotherapy can learn about wigs, turbans and scarves as well as makeup techniques and skin and nail care. . Use effective methods of birth control during your cancer treatment. Talk with your doctor or nurse about options for birth control. . Vaginal lubricants can be used to lessen vaginal dryness, itching, and pain during sexual relations.  Food and drug interactions There are no known interactions of irinotecan hydrochloride with food. This drug may interact with other medicines. Tell your doctor and pharmacist about all the medicines and dietary supplements (vitamins, minerals, herbs and others) that you are taking at this time. The safety and use of dietary supplements and alternative diets are often not known. Using these might affect your cancer or interfere with your treatment. Until more is known, you should not use dietary supplements or alternative diets without your cancer doctor's help.  When to call the doctor Call your doctor or nurse right away if you have any of these symptoms: . Loose bowel movements (diarrhea) more than 5 or 6 times a day or loose bowel movements with weakness or feeling lightheaded . Temperature of 100.4  F (38 C) or above . Chills . Trouble breathing or feeling short of breath . Easy bruising or bleeding . Nausea that stops you from eating or drinking . Throwing up more than three times in one day . Redness, pain, warmth, or swelling at the IV site . Feeling dizzy, lightheaded, or if  you pass out Call your doctor or nurse as soon as possible if you have any of these symptoms: . Nausea that is not relieved by prescribed medicines . Extreme tiredness that interferes with normal activities  Sexual problems and reproduction concerns . Infertility warning: Sexual problems and reproduction concerns may happen. In both men and women, this drug may affect your ability to have children. This cannot be determined before your treatment. Talk with your doctor or nurse if you plan to have children. Ask for information on sperm or egg banking. . In men, this drug may interfere with your ability to make sperm, but it should not change your ability to have sexual relations. . In women, menstrual bleeding may become irregular or stop while you are getting this drug. Do not assume that you cannot become pregnant if you do not have a menstrual period. . Women may go through signs of menopause (change of life) like vaginal dryness or itching. Vaginal lubricants can be used to lessen vaginal dryness, itching, and pain during sexual relations. . Genetic counseling is available for you to talk about the effects of this drug therapy on future pregnancies. Also, a genetic counselor can look at the possible risk of problems in the unborn baby due to this medicine if an exposure happens during pregnancy. . Pregnancy warning: This drug may have harmful effects on the unborn child, so effective methods of birth control should be used during your cancer treatment. . Breast feeding warning: Women should not breast feed during treatment because this drug could enter the breast milk and badly harm a breast feeding baby.    EDUCATIONAL MATERIALS GIVEN AND REVIEWED: Information on irinotecan.    SELF CARE ACTIVITIES WHILE ON CHEMOTHERAPY: Hydration Increase your fluid intake 48 hours prior to treatment and drink at least 8 to 12 cups (64 ounces) of water/decaff beverages per day after treatment. You can  still have your cup of coffee or soda but these beverages do not count as part of your 8 to 12 cups that you need to drink daily. No alcohol intake.  Medications Continue taking your normal prescription medication as prescribed.  If you start any new herbal or new supplements please let us know first to make sure it is safe.  Mouth Care Have teeth cleaned professionally before starting treatment. Keep dentures and partial plates clean. Use soft toothbrush and do not use mouthwashes that contain alcohol. Biotene is a good mouthwash that is available at most pharmacies or may be ordered by calling [phone]. Use warm salt water gargles (1 teaspoon salt per 1 quart warm water) before and after meals and at bedtime. Or you may rinse with 2 tablespoons of three-percent hydrogen peroxide mixed in eight ounces of water. If you are still having problems with your mouth or sores in your mouth please call the clinic. If you need dental work, please let Dr. Whitney Muse know before you go for your appointment so that we can coordinate the best possible time for you in regards to your chemo regimen. You need to also let your dentist know that you are actively taking chemo. We may need to do labs prior  to your dental appointment.   Skin Care Always use sunscreen that has not expired and with SPF (Sun Protection Factor) of 50 or higher. Wear hats to protect your head from the sun. Remember to use sunscreen on your hands, ears, face, & feet.  Use good moisturizing lotions such as udder cream, eucerin, or even Vaseline. Some chemotherapies can cause dry skin, color changes in your skin and nails.    . Avoid long, hot showers or baths. . Use gentle, fragrance-free soaps and laundry detergent. . Use moisturizers, preferably creams or ointments rather than lotions because the thicker consistency is better at preventing skin dehydration. Apply the cream or ointment within 15 minutes of showering. Reapply moisturizer at  night, and moisturize your hands every time after you wash them.  Hair Loss (if your doctor says your hair will fall out)  . If your doctor says that your hair is likely to fall out, decide before you begin chemo whether you want to wear a wig. You may want to shop before treatment to match your hair color. . Hats, turbans, and scarves can also camouflage hair loss, although some people prefer to leave their heads uncovered. If you go bare-headed outdoors, be sure to use sunscreen on your scalp. . Cut your hair short. It eases the inconvenience of shedding lots of hair, but it also can reduce the emotional impact of watching your hair fall out. . Don't perm or color your hair during chemotherapy. Those chemical treatments are already damaging to hair and can enhance hair loss. Once your chemo treatments are done and your hair has grown back, it's OK to resume dyeing or perming hair. With chemotherapy, hair loss is almost always temporary. But when it grows back, it may be a different color or texture. In older adults who still had hair color before chemotherapy, the new growth may be completely gray.  Often, new hair is very fine and soft.  Infection Prevention Please wash your hands for at least 30 seconds using warm soapy water. Handwashing is the #1 way to prevent the spread of germs. Stay away from sick people or people who are getting over a cold. If you develop respiratory systems such as green/yellow mucus production or productive cough or persistent cough let us know and we will see if you need an antibiotic. It is a good idea to keep a pair of gloves on when going into grocery stores/[employer] to decrease your risk of coming into contact with germs on the carts, etc. Carry alcohol hand gel with you at all times and use it frequently if out in public. If your temperature reaches 100.5 or higher please call the clinic and let us know.  If it is after hours or on the weekend please go to the ER if  your temperature is over 100.5.  Please have your own personal thermometer at home to use.    Sex and bodily fluids If you are going to have sex, a condom must be used to protect the person that isn't taking chemotherapy. Chemo can decrease your libido (sex drive). For a few days after chemotherapy, chemotherapy can be excreted through your bodily fluids.  When using the toilet please close the lid and flush the toilet twice.  Do this for a few day after you have had chemotherapy.     Effects of chemotherapy on your sex life Some changes are simple and won't last long. They won't affect your sex life permanently. Sometimes you may  feel: . too tired . not strong enough to be very active . sick or sore  . not in the mood . anxious or low Your anxiety might not seem related to sex. For example, you may be worried about the cancer and how your treatment is going. Or you may be worried about money, or about how you family are coping with your illness. These things can cause stress, which can affect your interest in sex. It's important to talk to your partner about how you feel. Remember - the changes to your sex life don't usually last long. There's usually no medical reason to stop having sex during chemo. The drugs won't have any long term physical effects on your performance or enjoyment of sex. Cancer can't be passed on to your partner during sex  Contraception It's important to use reliable contraception during treatment. Avoid getting pregnant while you or your partner are having chemotherapy. This is because the drugs may harm the baby. Sometimes chemotherapy drugs can leave a man or woman infertile.  This means you would not be able to have children in the future. You might want to talk to someone about permanent infertility. It can be very difficult to learn that you may no longer be able to have children. Some people find counselling helpful. There might be ways to preserve your fertility,  although this is easier for men than for women. You may want to speak to a fertility expert. You can talk about sperm banking or harvesting your eggs. You can also ask about other fertility options, such as donor eggs. If you have or have had breast cancer, your doctor might advise you not to take the contraceptive pill. This is because the hormones in it might affect the cancer.  It is not known for sure whether or not chemotherapy drugs can be passed on through semen or secretions from the vagina. Because of this some doctors advise people to use a barrier method if you have sex during treatment. This applies to vaginal, anal or oral sex. Generally, doctors advise a barrier method only for the time you are actually having the treatment and for about a week after your treatment. Advice like this can be worrying, but this does not mean that you have to avoid being intimate with your partner. You can still have close contact with your partner and continue to enjoy sex.  Animals If you have cats or birds we just ask that you not change the litter or change the cage.  Please have someone else do this for you while you are on chemotherapy.   Food Safety During and After Cancer Treatment Food safety is important for people both during and after cancer treatment. Cancer and cancer treatments, such as chemotherapy, radiation therapy, and stem cell/bone marrow transplantation, often weaken the immune system. This makes it harder for your body to protect itself from foodborne illness, also called food poisoning. Foodborne illness is caused by eating food that contains harmful bacteria, parasites, or viruses.  Foods to avoid Some foods have a higher risk of becoming tainted with bacteria. These include: Marland Kitchen Unwashed fresh fruit and vegetables, especially leafy vegetables that can hide dirt and other contaminants . Raw sprouts, such as alfalfa sprouts . Raw or undercooked beef, especially ground beef, or other  raw or undercooked meat and poultry . Fatty, fried, or spicy foods immediately before or after treatment.  These can sit heavy on your stomach and make you feel nauseous. . Raw  or undercooked shellfish, such as oysters. . Sushi and sashimi, which often contain raw fish.  . Unpasteurized beverages, such as unpasteurized fruit juices, raw milk, raw yogurt, or cider . Undercooked eggs, such as soft boiled, over easy, and poached; raw, unpasteurized eggs; or foods made with raw egg, such as homemade raw cookie dough and homemade mayonnaise Simple steps for food safety Shop smart. . Do not buy food stored or displayed in an unclean area. . Do not buy bruised or damaged fruits or vegetables. . Do not buy cans that have cracks, dents, or bulges. . Pick up foods that can spoil at the end of your shopping trip and store them in a cooler on the way home. Prepare and clean up foods carefully. . Rinse all fresh fruits and vegetables under running water, and dry them with a clean towel or paper towel. . Clean the top of cans before opening them. . After preparing food, wash your hands for 20 seconds with hot water and soap. Pay special attention to areas between fingers and under nails. . Clean your utensils and dishes with hot water and soap. Marland Kitchen Disinfect your kitchen and cutting boards using 1 teaspoon of liquid, unscented bleach mixed into 1 quart of water.   Dispose of old food. . Eat canned and packaged food before its expiration date (the "use by" or "best before" date). . Consume refrigerated leftovers within 3 to 4 days. After that time, throw out the food. Even if the food does not smell or look spoiled, it still may be unsafe. Some bacteria, such as Listeria, can grow even on foods stored in the refrigerator if they are kept for too long. Take precautions when eating out. . At restaurants, avoid buffets and salad bars where food sits out for a long time and comes in contact with many people. Food  can become contaminated when someone with a virus, often a norovirus, or another "bug" handles it. . Put any leftover food in a "to-go" container yourself, rather than having the server do it. And, refrigerate leftovers as soon as you get home. . Choose restaurants that are clean and that are willing to prepare your food as you order it cooked.    MEDICATIONS:                                                                                                                                                              Zofran/Ondansetron 8mg  tablet. Take 1 tablet every 8 hours as needed for nausea/vomiting. (#1 nausea med to take, this can constipate)  Compazine/Prochlorperazine 10mg  tablet. Take 1 tablet every 6 hours as needed for nausea/vomiting. (#2 nausea med to take, this can make you sleepy)   EMLA cream. Apply a quarter size amount to port site 1 hour  prior to chemo. Do not rub in. Cover with plastic wrap.   Over-the-Counter Meds:  Miralax 1 capful in 8 oz of fluid daily. May increase to two times a day if needed. This is a stool softener. If this doesn't work proceed you can add:  Senokot S-start with 1 tablet two times a day and increase to 4 tablets two times a day if needed. (total of 8 tablets in a 24 hour period). This is a stimulant laxative.   Call us if this does not help your bowels move.   Imodium 2mg  capsule. Take 2 capsules after the 1st loose stool and then 1 capsule every 2 hours until you go a total of 12 hours without having a loose stool. Call the [facility] if loose stools continue. If diarrhea occurs @ bedtime, take 2 capsules @ bedtime. Then take 2 capsules every 4 hours until morning. Call [facility].    Constipation Sheet *Miralax in 8 oz of fluid daily.  May increase to two times a day if needed.  This is a stool softener.  If this not enough to keep your bowel regular:  You can add:  *Senokot S, start with one tablet twice a day and can increase to 4  tablets twice a day if needed.  This is a stimulant laxative.   Sometimes when you take pain medication you need BOTH a medicine to keep your stool soft and a medicine to help your bowel push it out!  Please call if the above does not work for you.   Do not go more than 2 days without a bowel movement.  It is very important that you do not become constipated.  It will make you feel sick to your stomach (nausea) and can cause abdominal pain and vomiting.    Diarrhea Sheet  If you are having loose stools/diarrhea, please purchase Imodium and begin taking as outlined:  At the first sign of poorly formed or loose stools you should begin taking Imodium(loperamide) 2 mg capsules.  Take two caplets (4mg ) followed by one caplet (2mg ) every 2 hours until you have had no diarrhea for 12 hours.  During the night take two caplets (4mg ) at bedtime and continue every 4 hours during the night until the morning.  Stop taking Imodium only after there is no sign of diarrhea for 12 hours.    Always call the [facility] if you are having loose stools/diarrhea that you can't get under control.  Loose stools/disrrhea leads to dehydration (loss of water) in your body.  We have other options of trying to get the loose stools/diarrhea to stopped but you must let us know!    Nausea Sheet  Zofran/Ondansetron 8mg  tablet. Take 1 tablet every 8 hours as needed for nausea/vomiting. (#1 nausea med to take, this can constipate)  Compazine/Prochlorperazine 10mg  tablet. Take 1 tablet every 6 hours as needed for nausea/vomiting. (#2 nausea med to take, this can make you sleepy)  You can take these medications together or separately.  We would first like for you to try the Ondansetron by itself and then take the Prochloperizine if needed. But you are allowed to take both medications at the same time if your nausea is that severe.  If you are having persistent nausea (nausea that does not stop) please take these medications on a  staggered schedule so that the nausea medication stays in your body.  Please call the [facility] and let us know the amount of nausea that  you are experiencing.  If you begin to vomit, you need to call the [facility] and if it is the weekend and you have vomited more than one time and cant get it to stop-go to the Emergency Room.  Persistent nausea/vomiting can lead to dehydration (loss of fluid in your body) and will make you feel terrible.   Ice chips, sips of clear liquids, foods that are @ room temperature, crackers, and toast tend to be better tolerated.    SYMPTOMS TO REPORT AS SOON AS POSSIBLE AFTER TREATMENT:  FEVER GREATER THAN 100.5 F  CHILLS WITH OR WITHOUT FEVER  NAUSEA AND VOMITING THAT IS NOT CONTROLLED WITH YOUR NAUSEA MEDICATION  UNUSUAL SHORTNESS OF BREATH  UNUSUAL BRUISING OR BLEEDING  TENDERNESS IN MOUTH AND THROAT WITH OR WITHOUT PRESENCE OF ULCERS  URINARY PROBLEMS  BOWEL PROBLEMS  UNUSUAL RASH    Wear comfortable clothing and clothing appropriate for easy access to any Portacath or PICC line. Let us know if there is anything that we can do to make your therapy better!    What to do if you need assistance after hours or on the weekends: CALL [phone].  HOLD on the line, do not hang up.  You will hear multiple messages but at the end you will be connected with a nurse triage line.  They will contact Dr Whitney Muse if necessary.  Most of the time they will be able to assist you.   Do not call the hospital operator.  Dr Whitney Muse will not answer phone calls received by them.     I have been informed and understand all of the instructions given to me and have received a copy. I have been instructed to call the clinic [phone] or my family physician as soon as possible for continued medical care, if indicated. I do not have any more questions at this time but understand that I may call the [facility] or the Patient Navigator at [phone] during  office hours should I have questions or need assistance in obtaining follow-up care.

## 2016-02-03 ENCOUNTER — Encounter (HOSPITAL_COMMUNITY): Payer: Self-pay | Admitting: Hematology & Oncology

## 2016-02-03 DIAGNOSIS — Z7189 Other specified counseling: Secondary | ICD-10-CM | POA: Insufficient documentation

## 2016-02-05 ENCOUNTER — Ambulatory Visit (HOSPITAL_COMMUNITY): Payer: Medicare Other

## 2016-02-05 DIAGNOSIS — C787 Secondary malignant neoplasm of liver and intrahepatic bile duct: Secondary | ICD-10-CM | POA: Diagnosis not present

## 2016-02-05 DIAGNOSIS — I129 Hypertensive chronic kidney disease with stage 1 through stage 4 chronic kidney disease, or unspecified chronic kidney disease: Secondary | ICD-10-CM | POA: Diagnosis not present

## 2016-02-05 DIAGNOSIS — L89321 Pressure ulcer of left buttock, stage 1: Secondary | ICD-10-CM | POA: Diagnosis not present

## 2016-02-05 DIAGNOSIS — E039 Hypothyroidism, unspecified: Secondary | ICD-10-CM | POA: Diagnosis not present

## 2016-02-05 DIAGNOSIS — D509 Iron deficiency anemia, unspecified: Secondary | ICD-10-CM | POA: Diagnosis not present

## 2016-02-05 DIAGNOSIS — C189 Malignant neoplasm of colon, unspecified: Secondary | ICD-10-CM | POA: Diagnosis not present

## 2016-02-06 ENCOUNTER — Encounter (HOSPITAL_COMMUNITY): Payer: Self-pay | Admitting: Lab

## 2016-02-06 ENCOUNTER — Encounter (HOSPITAL_COMMUNITY): Payer: Medicare Other

## 2016-02-06 NOTE — Progress Notes (Unsigned)
Referral sent to Dr Arnoldo Morale for port placement, appt 12/28

## 2016-02-06 NOTE — Progress Notes (Signed)
Consent signed for irinotecan

## 2016-02-07 ENCOUNTER — Encounter (HOSPITAL_COMMUNITY): Payer: Self-pay

## 2016-02-07 ENCOUNTER — Encounter (HOSPITAL_COMMUNITY)
Admission: RE | Admit: 2016-02-07 | Discharge: 2016-02-07 | Disposition: A | Discharge status: Home / Self Care | Payer: Medicare Other | Source: Ambulatory Visit | Attending: General Surgery | Admitting: General Surgery

## 2016-02-07 DIAGNOSIS — C189 Malignant neoplasm of colon, unspecified: Secondary | ICD-10-CM | POA: Diagnosis not present

## 2016-02-07 NOTE — H&P (Signed)
  NTS SOAP Note  Vital Signs:  Vitals as of: 123456: Systolic Q000111Q: Diastolic 64: Heart Rate 72: Temp 99.63F (Temporal): Height 34ft 0in: Weight 134Lbs 0 Ounces: BMI 26.17   BMI : 26.17 kg/m2  Subjective: This 82 year old female presents for of need for portacath insertion.  Has metastatic colon cancer to liver and possible lung.  Is undergoing chemotherapy.  Review of Symptoms:  Constitutional:negative Head:negative Eyes:negative sinus problems Cardiovascular:negative Respiratory:dyspnea Gastrointestinabdominal pain Genitourinary:negative joint, neck, and back pain dry Hematolgic/Lymphatic:negative Allergic/Immunologic:negative   Past Medical History:Reviewed  Past Medical History  Surgical History: partial colectomy, TAH, appy, ileostomy, cholecystectomy, joint replacement left hip, cataract surgery Medical Problems: HTH, metastatic colon cancer to liver, lung, hypothyroidism Allergies: bactrum, sulfa, codeine, demerol, lidocaine HCL, morphine, dilaudid, nitrofurantoin, lisinopril, ramipril Medications: carvediolol, pantoprazole, levothyroxine, baby asa, xanax, fentanyl patch, hydrocodone   Social History:Reviewed  Social History  Preferred Language: English Race:  White Ethnicity: Not Hispanic / Latino Age: 73 year Marital Status:  M Alcohol: no   Smoking Status: Former smoker reviewed on 02/07/2016 Started Date: 02/11/1961 Stopped Date:  Functional Status reviewed on 02/07/2016 ------------------------------------------------ Bathing: Normal Cooking: Normal Dressing: Normal Driving: Normal Eating: Normal Managing Meds: Normal Oral Care: Normal Shopping: Normal Toileting: Normal Transferring: Disablilty Walking: Disablilty - uses a walker Cognitive Status reviewed on 02/07/2016 ------------------------------------------------ Attention: Normal Decision Making: Normal Language: Normal Memory: Normal Motor: Normal Perception:  Normal Problem Solving: Normal Visual and Spatial: Normal   Family History:Reviewed  Family Health History Mother, Deceased; Stroke (CVA);  Father, Deceased; Heart attack (myocardial infarction);     Objective Information: Frail wf in nad Head:Atraumatic; no masses; no abnormalities Neck:Supple without lymphadenopathy.  Heart:RRR, no murmur or gallop.  Normal S1, S2.  No S3, S4.  Lungs:CTA bilaterally, no wheezes, rhonchi, rales.  Breathing unlabored. Dr. Donald Pore notes reviewed. Assessment:metastatic colon cancer to liver  Diagnoses: 153.9  C18.9 Local recurrence of malignant tumor of colon (Malignant neoplasm of colon, unspecified)  Procedures: [id number] - OFFICE OUTPATIENT NEW 20 MINUTES    Plan:  Scheduled for portacath insertion on 02/08/16.   Patient Education:Alternative treatments to surgery were discussed with patient (and family).Risks and benefits  of procedure including bleeding, infection, and pneumothorax were fully explained to the patient (and family) who gave informed consent. Patient/family questions were addressed.  Follow-up:Pending Surgery

## 2016-02-08 ENCOUNTER — Ambulatory Visit (HOSPITAL_COMMUNITY): Payer: Medicare Other | Admitting: Anesthesiology

## 2016-02-08 ENCOUNTER — Ambulatory Visit (HOSPITAL_COMMUNITY)
Admission: RE | Admit: 2016-02-08 | Discharge: 2016-02-08 | Disposition: A | Discharge status: Home / Self Care | Payer: Medicare Other | Source: Ambulatory Visit | Attending: General Surgery | Admitting: General Surgery

## 2016-02-08 ENCOUNTER — Encounter (HOSPITAL_COMMUNITY)
Admission: RE | Disposition: A | Discharge status: Home / Self Care | Payer: Self-pay | Source: Ambulatory Visit | Attending: General Surgery

## 2016-02-08 ENCOUNTER — Encounter (HOSPITAL_COMMUNITY): Payer: Self-pay | Admitting: *Deleted

## 2016-02-08 ENCOUNTER — Ambulatory Visit (HOSPITAL_COMMUNITY): Payer: Medicare Other

## 2016-02-08 ENCOUNTER — Ambulatory Visit (HOSPITAL_BASED_OUTPATIENT_CLINIC_OR_DEPARTMENT_OTHER): Payer: Medicare Other

## 2016-02-08 VITALS — BP 160/62 | HR 64 | Temp 98.0°F | Resp 18 | Wt 136.7 lb

## 2016-02-08 DIAGNOSIS — E039 Hypothyroidism, unspecified: Secondary | ICD-10-CM | POA: Insufficient documentation

## 2016-02-08 DIAGNOSIS — I471 Supraventricular tachycardia: Secondary | ICD-10-CM | POA: Diagnosis not present

## 2016-02-08 DIAGNOSIS — Z5111 Encounter for antineoplastic chemotherapy: Secondary | ICD-10-CM | POA: Diagnosis not present

## 2016-02-08 DIAGNOSIS — Z9049 Acquired absence of other specified parts of digestive tract: Secondary | ICD-10-CM | POA: Insufficient documentation

## 2016-02-08 DIAGNOSIS — I509 Heart failure, unspecified: Secondary | ICD-10-CM | POA: Insufficient documentation

## 2016-02-08 DIAGNOSIS — Z885 Allergy status to narcotic agent status: Secondary | ICD-10-CM | POA: Diagnosis not present

## 2016-02-08 DIAGNOSIS — J45909 Unspecified asthma, uncomplicated: Secondary | ICD-10-CM | POA: Insufficient documentation

## 2016-02-08 DIAGNOSIS — Z888 Allergy status to other drugs, medicaments and biological substances status: Secondary | ICD-10-CM | POA: Diagnosis not present

## 2016-02-08 DIAGNOSIS — C189 Malignant neoplasm of colon, unspecified: Secondary | ICD-10-CM | POA: Insufficient documentation

## 2016-02-08 DIAGNOSIS — Z96642 Presence of left artificial hip joint: Secondary | ICD-10-CM | POA: Diagnosis not present

## 2016-02-08 DIAGNOSIS — C787 Secondary malignant neoplasm of liver and intrahepatic bile duct: Secondary | ICD-10-CM | POA: Insufficient documentation

## 2016-02-08 DIAGNOSIS — Z882 Allergy status to sulfonamides status: Secondary | ICD-10-CM | POA: Insufficient documentation

## 2016-02-08 DIAGNOSIS — F329 Major depressive disorder, single episode, unspecified: Secondary | ICD-10-CM | POA: Insufficient documentation

## 2016-02-08 DIAGNOSIS — Z87891 Personal history of nicotine dependence: Secondary | ICD-10-CM | POA: Insufficient documentation

## 2016-02-08 DIAGNOSIS — I110 Hypertensive heart disease with heart failure: Secondary | ICD-10-CM | POA: Diagnosis not present

## 2016-02-08 DIAGNOSIS — K219 Gastro-esophageal reflux disease without esophagitis: Secondary | ICD-10-CM | POA: Diagnosis not present

## 2016-02-08 DIAGNOSIS — Z452 Encounter for adjustment and management of vascular access device: Secondary | ICD-10-CM | POA: Diagnosis not present

## 2016-02-08 DIAGNOSIS — Z95828 Presence of other vascular implants and grafts: Secondary | ICD-10-CM

## 2016-02-08 HISTORY — PX: PORTACATH PLACEMENT: SHX2246

## 2016-02-08 LAB — GLUCOSE, CAPILLARY
GLUCOSE-CAPILLARY: 87 mg/dL (ref 65–99)
GLUCOSE-CAPILLARY: 88 mg/dL (ref 65–99)

## 2016-02-08 LAB — CBC WITH DIFFERENTIAL/PLATELET
BASOS ABS: 0 10*3/uL (ref 0.0–0.1)
BASOS PCT: 0 %
Eosinophils Absolute: 0.1 10*3/uL (ref 0.0–0.7)
Eosinophils Relative: 3 %
HEMATOCRIT: 30.3 % — AB (ref 36.0–46.0)
Hemoglobin: 9.9 g/dL — ABNORMAL LOW (ref 12.0–15.0)
Lymphocytes Relative: 25 %
Lymphs Abs: 1 10*3/uL (ref 0.7–4.0)
MCH: 30.5 pg (ref 26.0–34.0)
MCHC: 32.7 g/dL (ref 30.0–36.0)
MCV: 93.2 fL (ref 78.0–100.0)
MONO ABS: 0.3 10*3/uL (ref 0.1–1.0)
Monocytes Relative: 9 %
NEUTROS ABS: 2.4 10*3/uL (ref 1.7–7.7)
Neutrophils Relative %: 63 %
PLATELETS: 150 10*3/uL (ref 150–400)
RBC: 3.25 MIL/uL — AB (ref 3.87–5.11)
RDW: 13.4 % (ref 11.5–15.5)
WBC: 3.8 10*3/uL — AB (ref 4.0–10.5)

## 2016-02-08 LAB — COMPREHENSIVE METABOLIC PANEL
ALBUMIN: 3.1 g/dL — AB (ref 3.5–5.0)
ALT: 15 U/L (ref 14–54)
AST: 24 U/L (ref 15–41)
Alkaline Phosphatase: 54 U/L (ref 38–126)
Anion gap: 8 (ref 5–15)
BILIRUBIN TOTAL: 0.5 mg/dL (ref 0.3–1.2)
BUN: 20 mg/dL (ref 6–20)
CHLORIDE: 106 mmol/L (ref 101–111)
CO2: 27 mmol/L (ref 22–32)
CREATININE: 0.72 mg/dL (ref 0.44–1.00)
Calcium: 8.4 mg/dL — ABNORMAL LOW (ref 8.9–10.3)
GFR calc Af Amer: >60 mL/min (ref >60)
GLUCOSE: 92 mg/dL (ref 65–99)
Potassium: 4.2 mmol/L (ref 3.5–5.1)
Sodium: 141 mmol/L (ref 135–145)
Total Protein: 6 g/dL — ABNORMAL LOW (ref 6.5–8.1)

## 2016-02-08 SURGERY — INSERTION, TUNNELED CENTRAL VENOUS DEVICE, WITH PORT
Anesthesia: Monitor Anesthesia Care | Site: Chest | Laterality: Left

## 2016-02-08 MED ORDER — FENTANYL CITRATE (PF) 100 MCG/2ML IJ SOLN
25.0000 ug | Freq: Once | INTRAMUSCULAR | Status: AC
  Administered 2016-02-08: 25 ug via INTRAVENOUS

## 2016-02-08 MED ORDER — MIDAZOLAM HCL 5 MG/5ML IJ SOLN
INTRAMUSCULAR | Status: DC | PRN
  Administered 2016-02-08: 0.5 mg via INTRAVENOUS
  Administered 2016-02-08: 1 mg via INTRAVENOUS

## 2016-02-08 MED ORDER — ATROPINE SULFATE 1 MG/ML IJ SOLN
INTRAMUSCULAR | Status: AC
  Filled 2016-02-08: qty 1

## 2016-02-08 MED ORDER — SODIUM CHLORIDE 0.9% FLUSH: 10.0000 mL | INTRAVENOUS | Status: DC | PRN

## 2016-02-08 MED ORDER — SODIUM CHLORIDE 0.9 % IV SOLN
INTRAVENOUS | Status: DC | PRN
  Administered 2016-02-08: 500 mL via INTRAMUSCULAR

## 2016-02-08 MED ORDER — CHLORHEXIDINE GLUCONATE CLOTH 2 % EX PADS: 6.0000 | MEDICATED_PAD | Freq: Once | CUTANEOUS | Status: DC

## 2016-02-08 MED ORDER — HEPARIN SOD (PORK) LOCK FLUSH 100 UNIT/ML IV SOLN
INTRAVENOUS | Status: DC | PRN
  Administered 2016-02-08: 500 [IU] via INTRAVENOUS

## 2016-02-08 MED ORDER — PALONOSETRON HCL INJECTION 0.25 MG/5ML
0.2500 mg | Freq: Once | INTRAVENOUS | Status: AC
  Administered 2016-02-08: 0.25 mg via INTRAVENOUS

## 2016-02-08 MED ORDER — SODIUM CHLORIDE 0.9 % IV SOLN: 10.0000 mg | Freq: Once | INTRAVENOUS | Status: DC

## 2016-02-08 MED ORDER — SODIUM CHLORIDE 0.9 % IV SOLN
Freq: Once | INTRAVENOUS | Status: AC
  Administered 2016-02-08: 11:00:00 via INTRAVENOUS

## 2016-02-08 MED ORDER — MIDAZOLAM HCL 2 MG/2ML IJ SOLN
INTRAMUSCULAR | Status: AC
  Filled 2016-02-08: qty 2

## 2016-02-08 MED ORDER — PALONOSETRON HCL INJECTION 0.25 MG/5ML
INTRAVENOUS | Status: AC
  Filled 2016-02-08: qty 5

## 2016-02-08 MED ORDER — CEFAZOLIN SODIUM-DEXTROSE 2-4 GM/100ML-% IV SOLN
INTRAVENOUS | Status: AC
  Filled 2016-02-08: qty 100

## 2016-02-08 MED ORDER — FENTANYL CITRATE (PF) 100 MCG/2ML IJ SOLN
INTRAMUSCULAR | Status: AC
  Filled 2016-02-08: qty 2

## 2016-02-08 MED ORDER — LACTATED RINGERS IV SOLN
INTRAVENOUS | Status: DC
Start: 2016-02-08 — End: 2016-02-10
  Administered 2016-02-08: 08:00:00 via INTRAVENOUS

## 2016-02-08 MED ORDER — PROPOFOL 500 MG/50ML IV EMUL
INTRAVENOUS | Status: DC | PRN
  Administered 2016-02-08: 25 ug/kg/min via INTRAVENOUS

## 2016-02-08 MED ORDER — HEPARIN SOD (PORK) LOCK FLUSH 100 UNIT/ML IV SOLN
500.0000 [IU] | Freq: Once | INTRAVENOUS | Status: AC | PRN
  Administered 2016-02-08: 500 [IU]
  Filled 2016-02-08 (×2): qty 5

## 2016-02-08 MED ORDER — MIDAZOLAM HCL 2 MG/2ML IJ SOLN
1.0000 mg | INTRAMUSCULAR | Status: DC | PRN
  Administered 2016-02-08: 1 mg via INTRAVENOUS

## 2016-02-08 MED ORDER — CEFAZOLIN SODIUM-DEXTROSE 2-4 GM/100ML-% IV SOLN
2.0000 g | INTRAVENOUS | Status: AC
  Administered 2016-02-08: 2 g via INTRAVENOUS

## 2016-02-08 MED ORDER — HEPARIN SOD (PORK) LOCK FLUSH 100 UNIT/ML IV SOLN
INTRAVENOUS | Status: AC
  Filled 2016-02-08: qty 5

## 2016-02-08 MED ORDER — LIDOCAINE HCL (PF) 1 % IJ SOLN
INTRAMUSCULAR | Status: AC
  Filled 2016-02-08: qty 30

## 2016-02-08 MED ORDER — IRINOTECAN HCL CHEMO INJECTION 100 MG/5ML
65.0000 mg/m2 | Freq: Once | INTRAVENOUS | Status: AC
  Administered 2016-02-08: 100 mg via INTRAVENOUS
  Filled 2016-02-08: qty 5

## 2016-02-08 MED ORDER — PROPOFOL 10 MG/ML IV BOLUS
INTRAVENOUS | Status: AC
  Filled 2016-02-08: qty 20

## 2016-02-08 MED ORDER — DEXAMETHASONE SODIUM PHOSPHATE 10 MG/ML IJ SOLN
10.0000 mg | Freq: Once | INTRAMUSCULAR | Status: AC
  Administered 2016-02-08: 10 mg via INTRAVENOUS

## 2016-02-08 MED ORDER — ATROPINE SULFATE 1 MG/ML IJ SOLN
0.5000 mg | Freq: Once | INTRAMUSCULAR | Status: AC | PRN
  Administered 2016-02-08: 0.5 mg via INTRAVENOUS

## 2016-02-08 MED ORDER — FENTANYL CITRATE (PF) 100 MCG/2ML IJ SOLN
25.0000 ug | INTRAMUSCULAR | Status: DC | PRN
Start: 2016-02-08 — End: 2016-02-10
  Administered 2016-02-08: 25 ug via INTRAVENOUS

## 2016-02-08 MED ORDER — DEXAMETHASONE SODIUM PHOSPHATE 10 MG/ML IJ SOLN
INTRAMUSCULAR | Status: AC
  Filled 2016-02-08: qty 1

## 2016-02-08 SURGICAL SUPPLY — 32 items
BAG DECANTER FOR FLEXI CONT (MISCELLANEOUS) ×3 IMPLANT
BAG HAMPER (MISCELLANEOUS) ×3 IMPLANT
CHLORAPREP W/TINT 10.5 ML (MISCELLANEOUS) ×3 IMPLANT
CLOTH BEACON ORANGE TIMEOUT ST (SAFETY) ×3 IMPLANT
COVER LIGHT HANDLE STERIS (MISCELLANEOUS) ×6 IMPLANT
DECANTER SPIKE VIAL GLASS SM (MISCELLANEOUS) IMPLANT
DERMABOND ADVANCED (GAUZE/BANDAGES/DRESSINGS) ×2
DERMABOND ADVANCED .7 DNX12 (GAUZE/BANDAGES/DRESSINGS) ×1 IMPLANT
DRAPE C-ARM FOLDED MOBILE STRL (DRAPES) ×3 IMPLANT
DRSG TEGADERM 4X4.75 (GAUZE/BANDAGES/DRESSINGS) ×3 IMPLANT
ELECT REM PT RETURN 9FT ADLT (ELECTROSURGICAL) ×3
ELECTRODE REM PT RTRN 9FT ADLT (ELECTROSURGICAL) ×1 IMPLANT
GLOVE BIOGEL PI IND STRL 7.0 (GLOVE) ×1 IMPLANT
GLOVE BIOGEL PI INDICATOR 7.0 (GLOVE) ×2
GLOVE SURG SS PI 7.5 STRL IVOR (GLOVE) ×3 IMPLANT
GOWN STRL REUS W/TWL LRG LVL3 (GOWN DISPOSABLE) ×6 IMPLANT
IV NS 500ML (IV SOLUTION) ×2
IV NS 500ML BAXH (IV SOLUTION) ×1 IMPLANT
KIT PORT POWER 8FR ISP MRI (Port) ×3 IMPLANT
KIT ROOM TURNOVER APOR (KITS) ×3 IMPLANT
MANIFOLD NEPTUNE II (INSTRUMENTS) ×3 IMPLANT
NEEDLE HYPO 25X1 1.5 SAFETY (NEEDLE) ×3 IMPLANT
PACK MINOR (CUSTOM PROCEDURE TRAY) ×3 IMPLANT
PAD ARMBOARD 7.5X6 YLW CONV (MISCELLANEOUS) ×3 IMPLANT
SET BASIN LINEN APH (SET/KITS/TRAYS/PACK) ×3 IMPLANT
SPONGE GAUZE 2X2 8PLY STER LF (GAUZE/BANDAGES/DRESSINGS) ×1
SPONGE GAUZE 2X2 8PLY STRL LF (GAUZE/BANDAGES/DRESSINGS) ×2 IMPLANT
SUT VIC AB 3-0 SH 27 (SUTURE) ×2
SUT VIC AB 3-0 SH 27X BRD (SUTURE) ×1 IMPLANT
SUT VIC AB 4-0 PS2 27 (SUTURE) ×3 IMPLANT
SYR 20CC LL (SYRINGE) ×3 IMPLANT
SYR CONTROL 10ML LL (SYRINGE) ×3 IMPLANT

## 2016-02-08 NOTE — Anesthesia Postprocedure Evaluation (Signed)
Anesthesia Post Note  Patient: Karen [name]  Procedure(s) Performed: Procedure(s) (LRB): INSERTION PORT-A-CATH (Left)  Patient location during evaluation: PACU Anesthesia Type: MAC Level of consciousness: awake, oriented and patient cooperative Pain management: pain level controlled Vital Signs Assessment: post-procedure vital signs reviewed and stable Respiratory status: spontaneous breathing, nonlabored ventilation and respiratory function stable Cardiovascular status: blood pressure returned to baseline Postop Assessment: no signs of nausea or vomiting Anesthetic complications: no     Last Vitals:  Vitals:   02/08/16 0820 02/08/16 0907  BP: 139/67 (!) 141/61  Pulse:  73  Resp: 17 (!) 7  Temp:  (P) 36.7 C    Last Pain:  Vitals:   02/08/16 0715  TempSrc: Oral                 Ein Rijo J

## 2016-02-08 NOTE — Op Note (Signed)
Patient:  Karen [name]  DOB:  [birth year]  MRN:  [id number]   Preop Diagnosis:  Metastatic colon carcinoma, need for central venous access  Postop Diagnosis:  Same  Procedure:  Port-A-Cath insertion  Surgeon:  Aviva Signs, M.D.  Anes:  Mac  Indications:  Patient is an 82 year old white female who is about to undergo chemotherapy for recurrent colon carcinoma to liver. The risks and benefits of the procedure including bleeding, infection, and pneumothorax were fully explained to the patient, who gave informed consent.  Procedure note:  The patient was placed the supine position. After monitored anesthesia care was given, the left upper chest was prepped and draped using usual sterile technique with DuraPrep. Surgical site confirmation was performed. 1% Xylocaine was not used as the patient reported an allergy to this.  An incision was made below left clavicle. A subcutaneous pocket was formed. A needle is advanced into the left subclavian vein using the Seldinger technique one the patient was in Trendelenburg position without difficulty. A guidewire was then advanced into the right atrium under fluoroscopic guidance. An introducer peel-away sheath were placed over the guidewire. The catheter was then inserted through the peel-away sheath the peel-away sheath was removed. The catheter was then attached to the port and the port placed in subcutaneous pocket. Adequate positioning was confirmed by fluoroscopy. Good backflow blood was noted on aspiration of the port. A power port was inserted. The port was flushed with heparin flush. The port was left accessed as the patient was going to 1 college immediately after the procedure for chemotherapy. Subcutaneous layer was reapproximated using a 3-0 Vicryl interrupted suture. The skin was closed using a 4-0 Vicryl subcuticular suture. Dermabond was then applied.  All tape and needle counts were correct at the end of the procedure. The patient was  transferred to PACU in stable condition. A chest x-ray will be performed at that time.  Complications:  None  EBL:  Minimal  Specimen:  None

## 2016-02-08 NOTE — Interval H&P Note (Signed)
History and Physical Interval Note:  02/08/2016 8:00 AM  Karen [name]  has presented today for surgery, with the diagnosis of colon cancer  The various methods of treatment have been discussed with the patient and family. After consideration of risks, benefits and other options for treatment, the patient has consented to  Procedure(s): INSERTION PORT-A-CATH (N/A) as a surgical intervention .  The patient's history has been reviewed, patient examined, no change in status, stable for surgery.  I have reviewed the patient's chart and labs.  Questions were answered to the patient's satisfaction.     Aviva Signs A

## 2016-02-08 NOTE — Patient Instructions (Signed)
[facility] Discharge Instructions for Patients Receiving Chemotherapy   Beginning January 23rd 2017 lab work for the Cancer Center will be done in the  Main lab at [place] on 1st floor. If you have a lab appointment with the Cancer Center please come in thru the  Main Entrance and check in at the main information desk   Today you received the following chemotherapy agent: Irinotecan.     If you develop nausea and vomiting, or diarrhea that is not controlled by your medication, call the clinic.  The clinic phone number is [phone]. Office hours are Monday-Friday 8:30am-5:00pm.  BELOW ARE SYMPTOMS THAT SHOULD BE REPORTED IMMEDIATELY:  *FEVER GREATER THAN 101.0 F  *CHILLS WITH OR WITHOUT FEVER  NAUSEA AND VOMITING THAT IS NOT CONTROLLED WITH YOUR NAUSEA MEDICATION  *UNUSUAL SHORTNESS OF BREATH  *UNUSUAL BRUISING OR BLEEDING  TENDERNESS IN MOUTH AND THROAT WITH OR WITHOUT PRESENCE OF ULCERS  *URINARY PROBLEMS  *BOWEL PROBLEMS  UNUSUAL RASH Items with * indicate a potential emergency and should be followed up as soon as possible. If you have an emergency after office hours please contact your primary care physician or go to the nearest emergency department.  Please call the clinic during office hours if you have any questions or concerns.   You may also contact the Patient Navigator at [phone] should you have any questions or need assistance in obtaining follow up care.      Resources For Cancer Patients and their Caregivers ? American Cancer Society: Can assist with transportation, wigs, general needs, runs Look Good Feel Better.        1-888-227-6333 ? Cancer Care: Provides financial assistance, online support groups, medication/co-pay assistance.  1-800-813-HOPE (4673) ? [facility] Assists [place] cancer patients and their families through emotional , educational and financial support.   336-427-4357 ? [place] DSS Where to apply for food stamps, Medicaid and utility assistance. 336-342-1394 ? RCATS: Transportation to medical appointments. 336-347-2287 ? Social Security Administration: May apply for disability if have a Stage IV cancer. 336-342-7796 1-800-772-1213 ? [place] Aging, Disability and Transit Services: Assists with nutrition, care and transit needs. 336-349-2343          

## 2016-02-08 NOTE — Transfer of Care (Signed)
Immediate Anesthesia Transfer of Care Note  Patient: Karen [name]  Procedure(s) Performed: Procedure(s): INSERTION PORT-A-CATH (N/A)  Patient Location: PACU  Anesthesia Type:MAC  Level of Consciousness: awake and patient cooperative  Airway & Oxygen Therapy: Patient Spontanous Breathing and Patient connected to face mask oxygen  Post-op Assessment: Report given to RN, Post -op Vital signs reviewed and stable and Patient moving all extremities  Post vital signs: Reviewed and stable  Last Vitals:  Vitals:   02/08/16 0815 02/08/16 0820  BP: (!) 126/58 139/67  Resp: 14 17  Temp:      Last Pain:  Vitals:   02/08/16 0715  TempSrc: Oral         Complications: No apparent anesthesia complications

## 2016-02-08 NOTE — Anesthesia Preprocedure Evaluation (Signed)
Anesthesia Evaluation  Patient identified by MRN, date of birth, ID band Patient awake    Reviewed: Allergy & Precautions, NPO status , Patient's Chart, lab work & pertinent test results  Airway Mallampati: II       Dental  (+) Edentulous Upper, Edentulous Lower   Pulmonary asthma , former smoker,    breath sounds clear to auscultation       Cardiovascular hypertension, (-) angina+CHF and + DVT  + dysrhythmias Supra Ventricular Tachycardia  Rhythm:Regular Rate:Normal     Neuro/Psych  Headaches, PSYCHIATRIC DISORDERS Depression    GI/Hepatic GERD  ,  Endo/Other  diabetesHypothyroidism   Renal/GU      Musculoskeletal   Abdominal   Peds  Hematology   Anesthesia Other Findings   Reproductive/Obstetrics                             Anesthesia Physical Anesthesia Plan  ASA: III  Anesthesia Plan: MAC   Post-op Pain Management:    Induction: Intravenous  Airway Management Planned: Simple Face Mask  Additional Equipment:   Intra-op Plan:   Post-operative Plan:   Informed Consent: I have reviewed the patients History and Physical, chart, labs and discussed the procedure including the risks, benefits and alternatives for the proposed anesthesia with the patient or authorized representative who has indicated his/her understanding and acceptance.     Plan Discussed with:   Anesthesia Plan Comments:         Anesthesia Quick Evaluation

## 2016-02-08 NOTE — Progress Notes (Signed)
Patient tolerated well.  Patient and daughter educated on Irinotecan and it's possible side effects, as well as premeds we give to held prevent these side effects, prior to infusion.  VSS.  Patient ambulatory, but wheeled out of clinic by daughter per preference.  Patient stable upon discharge from clinic.

## 2016-02-08 NOTE — Discharge Instructions (Signed)
Implanted Port Insertion, Care After °Refer to this sheet in the next few weeks. These instructions provide you with information on caring for yourself after your procedure. Your health care provider may also give you more specific instructions. Your treatment has been planned according to current medical practices, but problems sometimes occur. Call your health care provider if you have any problems or questions after your procedure. °WHAT TO EXPECT AFTER THE PROCEDURE °After your procedure, it is typical to have the following:  °· Discomfort at the port insertion site. Ice packs to the area will help. °· Bruising on the skin over the port. This will subside in 3-4 days. °HOME CARE INSTRUCTIONS °· After your port is placed, you will get a manufacturer's information card. The card has information about your port. Keep this card with you at all times.   °· Know what kind of port you have. There are many types of ports available.   °· Wear a medical alert bracelet in case of an emergency. This can help alert health care workers that you have a port.   °· The port can stay in for as long as your health care provider believes it is necessary.   °· A home health care nurse may give medicines and take care of the port.   °· You or a family member can get special training and directions for giving medicine and taking care of the port at home.   °SEEK MEDICAL CARE IF:  °· Your port does not flush or you are unable to get a blood return.   °· You have a fever or chills. °SEEK IMMEDIATE MEDICAL CARE IF: °· You have new fluid or pus coming from your incision.   °· You notice a bad smell coming from your incision site.   °· You have swelling, pain, or more redness at the incision or port site.   °· You have chest pain or shortness of breath. °This information is not intended to replace advice given to you by your health care provider. Make sure you discuss any questions you have with your health care provider. °Document  Released: 11/17/2012 Document Revised: 02/01/2013 Document Reviewed: 11/17/2012 °Elsevier Interactive Patient Education © 2017 Elsevier Inc. °Implanted Port Home Guide °An implanted port is a type of central line that is placed under the skin. Central lines are used to provide IV access when treatment or nutrition needs to be given through a person's veins. Implanted ports are used for long-term IV access. An implanted port may be placed because:  °· You need IV medicine that would be irritating to the small veins in your hands or arms.   °· You need long-term IV medicines, such as antibiotics.   °· You need IV nutrition for a long period.   °· You need frequent blood draws for lab tests.   °· You need dialysis.   °Implanted ports are usually placed in the chest area, but they can also be placed in the upper arm, the abdomen, or the leg. An implanted port has two main parts:  °· Reservoir. The reservoir is round and will appear as a small, raised area under your skin. The reservoir is the part where a needle is inserted to give medicines or draw blood.   °· Catheter. The catheter is a thin, flexible tube that extends from the reservoir. The catheter is placed into a large vein. Medicine that is inserted into the reservoir goes into the catheter and then into the vein.   °HOW WILL I CARE FOR MY INCISION SITE? °Do not get the incision site wet.   Bathe or shower as directed by your health care provider.  °HOW IS MY PORT ACCESSED? °Special steps must be taken to access the port:  °· Before the port is accessed, a numbing cream can be placed on the skin. This helps numb the skin over the port site.   °· Your health care provider uses a sterile technique to access the port. °¨ Your health care provider must put on a mask and sterile gloves. °¨ The skin over your port is cleaned carefully with an antiseptic and allowed to dry. °¨ The port is gently pinched between sterile gloves, and a needle is inserted into the  port. °· Only "non-coring" port needles should be used to access the port. Once the port is accessed, a blood return should be checked. This helps ensure that the port is in the vein and is not clogged.   °· If your port needs to remain accessed for a constant infusion, a clear (transparent) bandage will be placed over the needle site. The bandage and needle will need to be changed every week, or as directed by your health care provider.   °· Keep the bandage covering the needle clean and dry. Do not get it wet. Follow your health care provider's instructions on how to take a shower or bath while the port is accessed.   °· If your port does not need to stay accessed, no bandage is needed over the port.   °WHAT IS FLUSHING? °Flushing helps keep the port from getting clogged. Follow your health care provider's instructions on how and when to flush the port. Ports are usually flushed with saline solution or a medicine called heparin. The need for flushing will depend on how the port is used.  °· If the port is used for intermittent medicines or blood draws, the port will need to be flushed:   °¨ After medicines have been given.   °¨ After blood has been drawn.   °¨ As part of routine maintenance.   °· If a constant infusion is running, the port may not need to be flushed.   °HOW LONG WILL MY PORT STAY IMPLANTED? °The port can stay in for as long as your health care provider thinks it is needed. When it is time for the port to come out, surgery will be done to remove it. The procedure is similar to the one performed when the port was put in.  °WHEN SHOULD I SEEK IMMEDIATE MEDICAL CARE? °When you have an implanted port, you should seek immediate medical care if:  °· You notice a bad smell coming from the incision site.   °· You have swelling, redness, or drainage at the incision site.   °· You have more swelling or pain at the port site or the surrounding area.   °· You have a fever that is not controlled with  medicine. °This information is not intended to replace advice given to you by your health care provider. Make sure you discuss any questions you have with your health care provider. °Document Released: 01/27/2005 Document Revised: 11/17/2012 Document Reviewed: 10/04/2012 °Elsevier Interactive Patient Education © 2017 Elsevier Inc. ° °

## 2016-02-09 ENCOUNTER — Encounter (HOSPITAL_COMMUNITY): Payer: Self-pay | Admitting: Hematology & Oncology

## 2016-02-12 ENCOUNTER — Encounter (HOSPITAL_COMMUNITY): Payer: Self-pay | Admitting: General Surgery

## 2016-02-12 ENCOUNTER — Telehealth (HOSPITAL_COMMUNITY): Payer: Self-pay | Admitting: *Deleted

## 2016-02-12 ENCOUNTER — Other Ambulatory Visit (HOSPITAL_COMMUNITY): Payer: Self-pay | Admitting: Emergency Medicine

## 2016-02-12 ENCOUNTER — Telehealth (HOSPITAL_COMMUNITY): Payer: Self-pay

## 2016-02-12 ENCOUNTER — Ambulatory Visit (HOSPITAL_COMMUNITY): Payer: Medicare Other

## 2016-02-12 ENCOUNTER — Ambulatory Visit (HOSPITAL_COMMUNITY): Payer: Medicare Other | Admitting: Oncology

## 2016-02-12 DIAGNOSIS — D509 Iron deficiency anemia, unspecified: Secondary | ICD-10-CM | POA: Diagnosis not present

## 2016-02-12 DIAGNOSIS — L89321 Pressure ulcer of left buttock, stage 1: Secondary | ICD-10-CM | POA: Diagnosis not present

## 2016-02-12 DIAGNOSIS — C189 Malignant neoplasm of colon, unspecified: Secondary | ICD-10-CM | POA: Diagnosis not present

## 2016-02-12 DIAGNOSIS — E039 Hypothyroidism, unspecified: Secondary | ICD-10-CM | POA: Diagnosis not present

## 2016-02-12 DIAGNOSIS — I129 Hypertensive chronic kidney disease with stage 1 through stage 4 chronic kidney disease, or unspecified chronic kidney disease: Secondary | ICD-10-CM | POA: Diagnosis not present

## 2016-02-12 DIAGNOSIS — C787 Secondary malignant neoplasm of liver and intrahepatic bile duct: Secondary | ICD-10-CM | POA: Diagnosis not present

## 2016-02-12 MED ORDER — LIDOCAINE-PRILOCAINE 2.5-2.5 % EX CREA: TOPICAL_CREAM | CUTANEOUS | 2 refills | Status: DC

## 2016-02-12 NOTE — Telephone Encounter (Signed)
Patient called for 24 hour follow up after new chemotherapy: Irinotecan.  Patient reported that she had some bad abdominal cramping for 2-3 days after the chemotherapy, but no further side effects.  She denies any diarrhea or change in bowel habits with the cramping.  Patient to return to the clinic this Friday for her next infusion and is aware of the follow up as well as treatment schedule.

## 2016-02-15 ENCOUNTER — Encounter (HOSPITAL_COMMUNITY): Payer: Self-pay | Admitting: Hematology & Oncology

## 2016-02-15 ENCOUNTER — Encounter (HOSPITAL_COMMUNITY): Payer: Medicare Other

## 2016-02-15 ENCOUNTER — Encounter (HOSPITAL_BASED_OUTPATIENT_CLINIC_OR_DEPARTMENT_OTHER): Payer: Medicare Other | Admitting: Hematology & Oncology

## 2016-02-15 ENCOUNTER — Encounter (HOSPITAL_COMMUNITY): Payer: Self-pay

## 2016-02-15 ENCOUNTER — Encounter (HOSPITAL_COMMUNITY): Payer: Medicare Other | Attending: Hematology and Oncology

## 2016-02-15 VITALS — BP 147/57 | HR 63 | Temp 98.0°F | Resp 16

## 2016-02-15 DIAGNOSIS — G893 Neoplasm related pain (acute) (chronic): Secondary | ICD-10-CM

## 2016-02-15 DIAGNOSIS — C787 Secondary malignant neoplasm of liver and intrahepatic bile duct: Secondary | ICD-10-CM

## 2016-02-15 DIAGNOSIS — R970 Elevated carcinoembryonic antigen [CEA]: Secondary | ICD-10-CM

## 2016-02-15 DIAGNOSIS — C189 Malignant neoplasm of colon, unspecified: Secondary | ICD-10-CM | POA: Diagnosis not present

## 2016-02-15 DIAGNOSIS — Z7189 Other specified counseling: Secondary | ICD-10-CM

## 2016-02-15 DIAGNOSIS — Z5111 Encounter for antineoplastic chemotherapy: Secondary | ICD-10-CM | POA: Diagnosis not present

## 2016-02-15 DIAGNOSIS — D509 Iron deficiency anemia, unspecified: Secondary | ICD-10-CM | POA: Diagnosis not present

## 2016-02-15 LAB — COMPREHENSIVE METABOLIC PANEL
ALBUMIN: 3.2 g/dL — AB (ref 3.5–5.0)
ALK PHOS: 60 U/L (ref 38–126)
ALT: 15 U/L (ref 14–54)
ANION GAP: 5 (ref 5–15)
AST: 22 U/L (ref 15–41)
BUN: 17 mg/dL (ref 6–20)
CALCIUM: 8.6 mg/dL — AB (ref 8.9–10.3)
CHLORIDE: 108 mmol/L (ref 101–111)
CO2: 28 mmol/L (ref 22–32)
Creatinine, Ser: 0.79 mg/dL (ref 0.44–1.00)
GFR calc Af Amer: >60 mL/min (ref >60)
GFR calc non Af Amer: >60 mL/min (ref >60)
GLUCOSE: 115 mg/dL — AB (ref 65–99)
Potassium: 3.6 mmol/L (ref 3.5–5.1)
SODIUM: 141 mmol/L (ref 135–145)
Total Bilirubin: 0.5 mg/dL (ref 0.3–1.2)
Total Protein: 6.1 g/dL — ABNORMAL LOW (ref 6.5–8.1)

## 2016-02-15 LAB — MAGNESIUM: Magnesium: 1.4 mg/dL — ABNORMAL LOW (ref 1.7–2.4)

## 2016-02-15 LAB — CBC WITH DIFFERENTIAL/PLATELET
BASOS PCT: 0 %
Basophils Absolute: 0 10*3/uL (ref 0.0–0.1)
Eosinophils Absolute: 0.2 10*3/uL (ref 0.0–0.7)
Eosinophils Relative: 4 %
HCT: 29.4 % — ABNORMAL LOW (ref 36.0–46.0)
HEMOGLOBIN: 9.5 g/dL — AB (ref 12.0–15.0)
Lymphocytes Relative: 17 %
Lymphs Abs: 0.7 10*3/uL (ref 0.7–4.0)
MCH: 30 pg (ref 26.0–34.0)
MCHC: 32.3 g/dL (ref 30.0–36.0)
MCV: 92.7 fL (ref 78.0–100.0)
Monocytes Absolute: 0.4 10*3/uL (ref 0.1–1.0)
Monocytes Relative: 10 %
NEUTROS PCT: 69 %
Neutro Abs: 2.7 10*3/uL (ref 1.7–7.7)
Platelets: 164 10*3/uL (ref 150–400)
RBC: 3.17 MIL/uL — AB (ref 3.87–5.11)
RDW: 13.2 % (ref 11.5–15.5)
WBC: 4 10*3/uL (ref 4.0–10.5)

## 2016-02-15 MED ORDER — ATROPINE SULFATE 1 MG/ML IJ SOLN
0.5000 mg | Freq: Once | INTRAMUSCULAR | Status: AC | PRN
  Administered 2016-02-15: 0.5 mg via INTRAVENOUS

## 2016-02-15 MED ORDER — DEXAMETHASONE SODIUM PHOSPHATE 10 MG/ML IJ SOLN
10.0000 mg | Freq: Once | INTRAMUSCULAR | Status: AC
  Administered 2016-02-15: 10 mg via INTRAVENOUS
  Filled 2016-02-15: qty 1

## 2016-02-15 MED ORDER — ATROPINE SULFATE 1 MG/ML IJ SOLN
INTRAMUSCULAR | Status: AC
Start: 2016-02-15 — End: 2016-02-15
  Filled 2016-02-15: qty 1

## 2016-02-15 MED ORDER — SODIUM CHLORIDE 0.9% FLUSH
10.0000 mL | INTRAVENOUS | Status: DC | PRN
  Administered 2016-02-15: 10 mL
  Filled 2016-02-15: qty 10

## 2016-02-15 MED ORDER — IRINOTECAN HCL CHEMO INJECTION 100 MG/5ML
65.0000 mg/m2 | Freq: Once | INTRAVENOUS | Status: AC
  Administered 2016-02-15: 100 mg via INTRAVENOUS
  Filled 2016-02-15: qty 5

## 2016-02-15 MED ORDER — PALONOSETRON HCL INJECTION 0.25 MG/5ML
0.2500 mg | Freq: Once | INTRAVENOUS | Status: AC
  Administered 2016-02-15: 0.25 mg via INTRAVENOUS

## 2016-02-15 MED ORDER — MAGNESIUM SULFATE 2 GM/50ML IV SOLN
2.0000 g | Freq: Once | INTRAVENOUS | Status: AC
  Administered 2016-02-15: 2 g via INTRAVENOUS
  Filled 2016-02-15: qty 50

## 2016-02-15 MED ORDER — PALONOSETRON HCL INJECTION 0.25 MG/5ML
INTRAVENOUS | Status: AC
  Filled 2016-02-15: qty 5

## 2016-02-15 MED ORDER — SODIUM CHLORIDE 0.9 % IV SOLN
Freq: Once | INTRAVENOUS | Status: AC
  Administered 2016-02-15: 11:00:00 via INTRAVENOUS

## 2016-02-15 MED ORDER — SODIUM CHLORIDE 0.9 % IV SOLN: 10.0000 mg | Freq: Once | INTRAVENOUS | Status: DC

## 2016-02-15 MED ORDER — HEPARIN SOD (PORK) LOCK FLUSH 100 UNIT/ML IV SOLN
500.0000 [IU] | Freq: Once | INTRAVENOUS | Status: AC | PRN
  Administered 2016-02-15: 500 [IU]

## 2016-02-15 NOTE — Progress Notes (Signed)
Chemotherapy given today per orders. Patient tolerated it well, no problems. Vitals stable and discharged from clinic via wheelchair with daughter.follow up as scheduled

## 2016-02-15 NOTE — Progress Notes (Signed)
Nutrition  Called patient to let her know that first complimentary case of ensure plus will be available for pick up after Monday 1/8.  Patient said she would wait and pick up case on 1/12 at her next appointment.    Kyriakos Babler B. Zenia Resides, [place], [place] (pager)

## 2016-02-15 NOTE — Patient Instructions (Signed)
[name] [facility] Discharge Instructions  RECOMMENDATIONS MADE BY THE CONSULTANT AND ANY TEST RESULTS WILL BE SENT TO YOUR REFERRING PHYSICIAN.  You were seen today by Dr. Whitney Muse Follow up next week for labs and chemo Follow up in 2 weeks for appointment with Karen [name], labs and chemo  Thank you for choosing [name] at [facility] to provide your oncology and hematology care.  To afford each patient quality time with our provider, please arrive at least 15 minutes before your scheduled appointment time.    If you have a lab appointment with the [facility] please come in thru the  Main Entrance and check in at the main information desk  You need to re-schedule your appointment should you arrive 10 or more minutes late.  We strive to give you quality time with our providers, and arriving late affects you and other patients whose appointments are after yours.  Also, if you no show three or more times for appointments you may be dismissed from the clinic at the providers discretion.     Again, thank you for choosing [facility].  Our hope is that these requests will decrease the amount of time that you wait before being seen by our physicians.       _____________________________________________________________  Should you have questions after your visit to [facility], please contact our office at [phone] between the hours of 8:30 a.m. and 4:30 p.m.  Voicemails left after 4:30 p.m. will not be returned until the following business day.  For prescription refill requests, have your pharmacy contact our office.       Resources For Cancer Patients and their Caregivers ? American Cancer Society: Can assist with transportation, wigs, general needs, runs Look Good Feel Better.        204-168-0421 ? Cancer Care: Provides financial assistance, online support groups, medication/co-pay assistance.  1-800-813-HOPE  716-662-2860) ? [place] Assists [place] cancer patients and their families through emotional , educational and financial support.  (908)212-3720 ? [place] DSS Where to apply for food stamps, Medicaid and utility assistance. 954-201-5112 ? RCATS: Transportation to medical appointments. (872)771-3692 ? Social Security Administration: May apply for disability if have a Stage IV cancer. 406-091-3981 2561606387 ? [employer], Disability and Transit Services: Assists with nutrition, care and transit needs. [place] Support Programs: @10RELATIVEDAYS @ > Cancer Support Group  2nd Tuesday of the month 1pm-2pm, [name]  > Creative Journey  3rd Tuesday of the month 1130am-1pm, [name]  > Look Good Feel Better  1st Wednesday of the month 10am-12 noon, [name] (Call [place] to register [phone])

## 2016-02-15 NOTE — Patient Instructions (Signed)
.  [facility] Discharge Instructions for Patients Receiving Chemotherapy   Beginning January 23rd 2017 lab work for the [facility] will be done in the  Main lab at [facility] on 1st floor. If you have a lab appointment with the [facility] please come in thru the  Main Entrance and check in at the main information desk   Today you received the following chemotherapy agents irranotecan  To help prevent nausea and vomiting after your treatment, we encourage you to take your nausea medication     If you develop nausea and vomiting, or diarrhea that is not controlled by your medication, call the clinic.  The clinic phone number is [phone]. Office hours are Monday-Friday 8:30am-5:00pm.  BELOW ARE SYMPTOMS THAT SHOULD BE REPORTED IMMEDIATELY:  *FEVER GREATER THAN 101.0 F  *CHILLS WITH OR WITHOUT FEVER  NAUSEA AND VOMITING THAT IS NOT CONTROLLED WITH YOUR NAUSEA MEDICATION  *UNUSUAL SHORTNESS OF BREATH  *UNUSUAL BRUISING OR BLEEDING  TENDERNESS IN MOUTH AND THROAT WITH OR WITHOUT PRESENCE OF ULCERS  *URINARY PROBLEMS  *BOWEL PROBLEMS  UNUSUAL RASH Items with * indicate a potential emergency and should be followed up as soon as possible. If you have an emergency after office hours please contact your primary care physician or go to the nearest emergency department.  Please call the clinic during office hours if you have any questions or concerns.   You may also contact the Patient Navigator at [phone] should you have any questions or need assistance in obtaining follow up care.      Resources For Cancer Patients and their Caregivers ? American Cancer Society: Can assist with transportation, wigs, general needs, runs Look Good Feel Better.        539-533-1303 ? Cancer Care: Provides financial assistance, online support groups, medication/co-pay assistance.  1-800-813-HOPE 3602536002) ? [place] Assists [place]  cancer patients and their families through emotional , educational and financial support.  618-831-2675 ? [place] DSS Where to apply for food stamps, Medicaid and utility assistance. [phone] ? RCATS: Transportation to medical appointments. 9725221372 ? Social Security Administration: May apply for disability if have a Stage IV cancer. 6310410806 (630)881-8990 ? [employer], Disability and Transit Services: Assists with nutrition, care and transit needs. 978-870-0069

## 2016-02-15 NOTE — Progress Notes (Signed)
[name], MD [place] / [place] [address]   DIAGNOSIS: Colon carcinoma with permanent ostomy Diversion Colitis Achalasia Iron deficiency, anemia secondary to GI related blood loss Hemeoccult positive stools Rising CEA with CT imaging on 07/13/2014 1.3 cm mass in L hepatic lobe Subcapsular mass in anterior pole of L kidney suspicious for RCC Cutaneous microwave ablation of her now biopsy-proven metastatic colonic adenocarcinoma to the left liver 09/01/2014    Colon cancer metastasized to liver [facility])   04/12/1987 Definitive Surgery    Dr. Lindalou [name] at [facility]      09/01/2014 Pathology Results    Liver, needle/core biopsy - METASTATIC ADENOCARCINOMA.      09/01/2014 Miscellaneous    MWA left liver lesion, Karen [name] (IR)      01/18/2015 Progression    PET scan      01/18/2015 PET scan    Recurrence of  tumor within segment 2 of the liver. There are 2 new lesions identified within segment 5 and segment 6 of the liver worrisome for additional sites of metastasis. Evidence of tumor recurrence at the intra colonic anastomosis is noted...      01/28/2015 - 04/16/2015 Chemotherapy    Xeloda 1500 mg BID 7 days on and 7 days off beginning in December 2016.      04/12/2015 Imaging    CT C/A/P progression of disease, enlargement of metastatic lesion in segments 2/3 of liver, development of mass with colon at junction of descending colon and sigmoid, indeterminate lesion in lower pole of the left kidney      04/20/2015 - 01/07/2016 Antibody Plan    Vectibix every 2 weeks, single-agent.      06/12/2015 - 06/13/2015 Hospital Admission    Hypotension, diarrhea, ARF, UTI      06/15/2015 Adverse Reaction    Vectibix rash      06/15/2015 Treatment Plan Change    Vectibix reduced to 5 mg/m2      07/13/2015 Treatment Plan Change    Treatment deferred due to hypomagnesemia and palpitations.  Escorted to ED.      09/24/2015 Imaging    CT abd/pelvis-  Liver lesions as follows:  Segment 2 left liver lobe hypodense 3.5 x 2.2 cm liver mass, previously 3.1 x 2.0 cm, mildly increased. Medial segment 6 right liver lobe 1.7 x 0.9 cm hypodense liver mass, new. Peripheral segment 6 right liver lobe 1.0 x 0.7 cm hypodense liver mass, new. Serosal 0.7 cm segment 5 right liver lobe mass, previously 0.6 cm, not appreciably changed. Hypodense 0.6 cm liver lesion adjacent to the IVC, previously 0.6 cm, unchanged.      09/24/2015 Imaging    CT chest- New 3 mm right upper lobe pulmonary nodule, indeterminate, pulmonary metastasis not excluded.      11/04/2015 Imaging    CT abd/pelvis- Mechanical small-bowel obstruction at the level of the mid small bowel, probably due to adhesions. No free air. No fluid collections. 2. Liver metastases appear stable. No new or progressive metastatic disease in the abdomen or pelvis.      01/09/2016 Imaging    CT CAP- Interval increase in size and prominence of RIGHT upper lobe pulmonary nodule measuring 4 mm is highly concerning for pulmonary metastasis. New enhancing lesion in the LEFT lateral hepatic lobe consistent with progression of hepatic metastasis. Stable mass along the descending colon was hypermetabolic on comparison PET-CT scan.  01/09/2016 Progression    CT imaging demonstrates new hepatic lobe lesion      02/08/2016 -  Chemotherapy    The patient had palonosetron (ALOXI) injection 0.25 mg, 0.25 mg, Intravenous,  Once, 1 of 4 cycles  irinotecan (CAMPTOSAR) 100 mg in dextrose 5 % 500 mL chemo infusion, 65 mg/m2 = 100 mg (100 % of original dose 65 mg/m2), Intravenous,  Once, 1 of 4 cycles Dose modification: 100 mg/m2 (original dose 65 mg/m2, Cycle 1, Reason: Provider Judgment), 75 mg/m2 (original dose 65 mg/m2, Cycle 1, Reason: Provider Judgment), 65 mg/m2 (original dose 65 mg/m2, Cycle 1, Reason: Provider Judgment), 75 mg/m2 (original dose 65 mg/m2, Cycle 1,  Reason: Provider Judgment)  for chemotherapy treatment.         History of Present Illness: Karen [name] returns today for further follow-up of stage IV CRC s/p microwave ablation of a solitary liver lesion. Unfortunately repeat imaging in December revealed other liver disease and recurrence at the prior anastomotic site.  She also has a L renal lesion that is under observation. She has other health issues including chronic LBP. She has progressed through XELODA and Vectibux. She will not consider hospice. She is on single agent irinotecan, dose reduced.   Karen [name] is accompanied by her daughter. She ambulates with the use of a cane, but has been staying active.   She has been experiencing some cramping in her abdomen. She says she almost went to the ED. She hasn't been taking the Lomotil. She has some lower abdominal pain in addition to the cramping. She took some hydrocodone but it didn't help.  She says that other than that, "nothing more than usual". She says that she has been eating, but nothing tastes good. Her daughter says that she has been snacking and doesn't eat much at a time.   She has some swelling in her legs.   Pt found that she has been having some diarrhea, but not much. This is chronic. She has an ostomy.  Realistically she has the same complaints/concerns as always.  Appetite remains poor. Pain is unchanged.   MEDICAL HISTORY: Past Medical History:  Diagnosis Date  . Abdominal adhesions   . Achalasia    a. s/p botox injection for achalasia.  . Anemia   . Arthritis   . Asthma    hx of  . Blood transfusion   . Breast lump   . Cancer ([place])    bladder  . Chest pain    a. 2002 Cath: nl cors;  b. 2009 aden mv: nl;  c. 05/2009 Echo: nl. d. 2014: normal nuclear stress test, EF 69%.  . Chronic diastolic CHF (congestive heart failure) (HCC)    a. nl EF by echo 05/2009.  . CKD (chronic kidney disease), stage III    pt. states decreased kidney function  . Colon cancer  ([place])    a. recurrence 2016 with liver mets -  percutaneous liver biopsy and thermal ablation of a liver metastasis by interventional radiology..  . Colon polyp   . Depression   . Diabetes mellitus without complication ([place]) 0/25/85   borderline type II; takes no medication for it  . DVT of deep femoral vein ([place])   . Family history of bladder cancer   . Family history of breast cancer   . Family history of colon cancer   . Fibromyalgia   . GERD (gastroesophageal reflux disease)   . Headache   . Hearing loss   . Hernia   .  History of blood clots   . History of PSVT (paroxysmal supraventricular tachycardia)   . Hyperlipidemia   . Hypertension   . Ileostomy present ([place])   . Iron deficiency anemia due to chronic blood loss 07/06/2013  . LBBB (left bundle branch block)   . Obstruction of intestine or colon ([place]) 06/2014  . Sinus bradycardia    a. HR 30s in 08/2014 requiring holding of digoxin.  Marland Kitchen Thyroid disease   . UTI (urinary tract infection)   . Vision abnormalities 2016   not going blind but having retina problems; might lose ability to see faces  . Wears dentures     has Left bundle branch block; Chest pain, exertional; Benign hypertensive heart disease without heart failure; Hypothyroid; Small bowel obstruction, partial ([place]); Dysphagia; Paroxysmal SVT (supraventricular tachycardia) ([place]); Ileostomy status ([place]); Unstable angina ([place]); Parastomal hernia; Iron deficiency anemia due to chronic blood loss; Malabsorption of iron; Acute abdominal pain; Acute renal failure ([place]); Hyperglycemia; UTI (urinary tract infection); Small bowel obstruction ([place]); Abdominal pain, acute; Liver mass, left lobe; Colon cancer metastasized to liver [facility]); Liver lesion; Sinus bradycardia; Family history of colon cancer; Family history of bladder cancer; Family history of breast cancer; Genetic testing; Counseling regarding end of life decision making; Neoplasm related pain; AKI (acute kidney injury)  ([place]); Urinary tract infection; Hypotension; Diarrhea; Elevated troponin; CKD (chronic kidney disease) stage 3, GFR 30-59 ml/min; Hypomagnesemia; SBO (small bowel obstruction) (HCC); GERD (gastroesophageal reflux disease); and HTN (hypertension) on her problem list.     is allergic to bactrim [sulfamethoxazole-trimethoprim]; codeine; demerol; lidocaine hcl; morphine and related; dilaudid [hydromorphone hcl]; nitrofurantoin monohyd macro; lisinopril; and ramipril.   Iron Infusion in March 2016. Mammogram performed in 2015. Reported multiple hernias.   SURGICAL HISTORY: Past Surgical History:  Procedure Laterality Date  . ABDOMINAL HYSTERECTOMY    . APPENDECTOMY    . BOTOX INJECTION N/A 09/01/2013   Procedure: BOTOX INJECTION;  Surgeon: Rogene Houston, MD;  Location: [facility];  Service: Endoscopy;  Laterality: N/A;  . BREAST SURGERY     right breast biopsy  . CARDIAC CATHETERIZATION  12/10/2000   THE LEFT VENTRICLE IS MILDY DILATED. THERE IS MILD TO MODERATE DIFFUSE HYPOKINESIS WITH EF 35%  . CATARACT EXTRACTION W/PHACO  11/13/2011   Procedure: CATARACT EXTRACTION PHACO AND INTRAOCULAR LENS PLACEMENT (IOC);  Surgeon: Tonny Branch, MD;  Location: [facility] ORS;  Service: Ophthalmology;  Laterality: Right;  CDE 12.26  . CATARACT EXTRACTION W/PHACO  12/08/2011   Procedure: CATARACT EXTRACTION PHACO AND INTRAOCULAR LENS PLACEMENT (IOC);  Surgeon: Tonny Branch, MD;  Location: [facility] ORS;  Service: Ophthalmology;  Laterality: Left;  CDE 15.11  . CHOLECYSTECTOMY    . COLON CANCER SURGERY    . COLON SURGERY    . COLONOSCOPY  09/26/2011   Procedure: COLONOSCOPY;  Surgeon: Rogene Houston, MD;  Location: [facility];  Service: Endoscopy;  Laterality: N/A;  215  . CORONARY ANGIOPLASTY    . ESOPHAGOGASTRODUODENOSCOPY  02/13/2011   Procedure: ESOPHAGOGASTRODUODENOSCOPY (EGD);  Surgeon: Rogene Houston, MD;  Location: [facility];  Service: Endoscopy;  Laterality: N/A;  1030  . ESOPHAGOGASTRODUODENOSCOPY N/A  09/01/2013   Procedure: ESOPHAGOGASTRODUODENOSCOPY (EGD);  Surgeon: Rogene Houston, MD;  Location: [facility];  Service: Endoscopy;  Laterality: N/A;  830  . EYE SURGERY  2014   catarac surgery  . ILEOSTOMY    . ILEOSTOMY    . JOINT REPLACEMENT Left 2008  . liver biopsy and ablation  09/01/14  .  ovarian cystectomy 1955    . ROTATOR CUFF REPAIR    . TONSILLECTOMY      SOCIAL HISTORY: Social History   Social History  . Marital status: Married    Spouse name: N/A  . Number of children: 4  . Years of education: N/A   Occupational History  . Not on file.   Social History Main Topics  . Smoking status: Former Smoker    Packs/day: 0.25    Years: 13.00    Types: Cigarettes    Quit date: 02/11/1963  . Smokeless tobacco: Never Used  . Alcohol use No  . Drug use: No  . Sexual activity: Not on file   Other Topics Concern  . Not on file   Social History Narrative  . No narrative on file   Reads inspirational books Has 88 Great-Grandchildren and 3 Grandchildren  Father died 45 Mother died 66 Oldest brother died 36  FAMILY HISTORY: Family History  Problem Relation Age of Onset  . Heart disease Mother   . Stroke Mother     deceased  . Arthritis Mother   . Bladder Cancer Mother     dx in her 50s  . Colon cancer Mother   . Arthritis Father   . Heart disease Father     decesaed  . Leukemia Father   . Stroke Sister     alive/debilitated  . Hypertension Sister   . Other Sister     paralysis  . Heart disease Brother     bypass surgery  . Arthritis Brother   . Colon cancer Brother   . Bladder Cancer Brother   . Stroke Sister     alive/debilitated  . Diabetes Sister   . Other Brother     stomach problems  . Other Brother     bladder  . Colon cancer Paternal Aunt   . Bladder Cancer Other     dx twice in her 30s-40s  . Breast cancer Other     great niece dx in her early 12s  . Prostate cancer Other     newphew  . Colon polyps Daughter   . Stroke    .  Hypertension      Review of Systems  Constitutional: Negative.   HENT: Negative.   Eyes: Negative.   Respiratory: Negative.   Cardiovascular: Positive for leg swelling.  Gastrointestinal: Positive for abdominal pain and diarrhea.       Pos abdominal cramping  Genitourinary: Negative.   Musculoskeletal: Negative.   Skin: Negative.   Neurological: Negative.   Endo/Heme/Allergies: Negative.   Psychiatric/Behavioral: Negative.   All other systems reviewed and are negative. 14 point review of systems was performed and is negative except as detailed under history of present illness and above   PHYSICAL EXAMINATION  Vitals with BMI 02/15/2016  Height _0   Weight 139 lbs 8 oz  BMI 74.7  Systolic 159  Diastolic 63  Pulse 73  Respirations 16    ECOG PERFORMANCE STATUS: 2 - Symptomatic, <50% confined to bed   Physical Exam  Constitutional: She is oriented to person, place, and time and well-developed, well-nourished, and in no distress.  Exam was done from chair.  Ambulates with use of a cane.  Wears glasses.   HENT:  Head: Normocephalic and atraumatic.  Eyes: EOM are normal. Pupils are equal, round, and reactive to light. No scleral icterus.  Neck: Normal range of motion. Neck supple.  Cardiovascular: Normal rate, regular rhythm and normal heart sounds.  Pulmonary/Chest: Effort normal and breath sounds normal.  Abdominal: Soft. Bowel sounds are normal. She exhibits no distension. There is no tenderness. There is no rebound.  Para-stomal hernia  Musculoskeletal: Normal range of motion.  Lymphadenopathy:    She has no cervical adenopathy.  Neurological: She is alert and oriented to person, place, and time. Gait normal.  Skin: Skin is warm and dry.  Psychiatric:  Flat affect  Nursing note and vitals reviewed.   LABORATORY DATA: Results for Karen [name], Karen [name] (MRN [id number]) as of 02/15/2016 08:09   Ref. Range 02/15/2016 09:28  Sodium Latest Ref Range: 135 - 145 mmol/L 141    Potassium Latest Ref Range: 3.5 - 5.1 mmol/L 3.6  Chloride Latest Ref Range: 101 - 111 mmol/L 108  CO2 Latest Ref Range: 22 - 32 mmol/L 28  Glucose Latest Ref Range: 65 - 99 mg/dL 115 (H)  BUN Latest Ref Range: 6 - 20 mg/dL 17  Creatinine Latest Ref Range: 0.44 - 1.00 mg/dL 0.79  Calcium Latest Ref Range: 8.9 - 10.3 mg/dL 8.6 (L)  Anion gap Latest Ref Range: 5 - 15  5  Magnesium Latest Ref Range: 1.7 - 2.4 mg/dL 1.4 (L)  Alkaline Phosphatase Latest Ref Range: 38 - 126 U/L 60  Albumin Latest Ref Range: 3.5 - 5.0 g/dL 3.2 (L)  AST Latest Ref Range: 15 - 41 U/L 22  ALT Latest Ref Range: 14 - 54 U/L 15  Total Protein Latest Ref Range: 6.5 - 8.1 g/dL 6.1 (L)  Total Bilirubin Latest Ref Range: 0.3 - 1.2 mg/dL 0.5  EGFR (African American) Latest Ref Range: >60 mL/min >60  EGFR (Non-African Amer.) Latest Ref Range: >60 mL/min >60  WBC Latest Ref Range: 4.0 - 10.5 K/uL 4.0  RBC Latest Ref Range: 3.87 - 5.11 MIL/uL 3.17 (L)  Hemoglobin Latest Ref Range: 12.0 - 15.0 g/dL 9.5 (L)  HCT Latest Ref Range: 36.0 - 46.0 % 29.4 (L)  MCV Latest Ref Range: 78.0 - 100.0 fL 92.7  MCH Latest Ref Range: 26.0 - 34.0 pg 30.0  MCHC Latest Ref Range: 30.0 - 36.0 g/dL 32.3  RDW Latest Ref Range: 11.5 - 15.5 % 13.2  Platelets Latest Ref Range: 150 - 400 K/uL 164  Neutrophils Latest Units: % 69  Lymphocytes Latest Units: % 17  Monocytes Relative Latest Units: % 10  Eosinophil Latest Units: % 4  Basophil Latest Units: % 0  NEUT# Latest Ref Range: 1.7 - 7.7 K/uL 2.7  Lymphocyte # Latest Ref Range: 0.7 - 4.0 K/uL 0.7  Monocyte # Latest Ref Range: 0.1 - 1.0 K/uL 0.4  Eosinophils Absolute Latest Ref Range: 0.0 - 0.7 K/uL 0.2  Basophils Absolute Latest Ref Range: 0.0 - 0.1 K/uL 0.0      RADIOLOGY: I have reviewed the images detailed below and agree with the results:  Study Result   CLINICAL DATA:  Port-A-Cath placement  EXAM: PORTABLE CHEST 1 VIEW  COMPARISON:  He 01/24/2016  FINDINGS: Left  subclavian Port-A-Cath tip in the mid SVC.  No pneumothorax.  Cardiac enlargement. Negative for heart failure. Mild atelectasis in the lingula. Lungs otherwise clear.  IMPRESSION: Satisfactory Port-A-Cath placement.   Electronically Signed   By: Franchot Gallo M.D.   On: 02/08/2016 09:23    Study Result   CLINICAL DATA:  Colorectal carcinoma with liver metastasis. Subsequent treatment evaluation.  EXAM: CT CHEST, ABDOMEN, AND PELVIS WITH CONTRAST  TECHNIQUE: Multidetector CT imaging of the chest, abdomen and pelvis was performed following the standard protocol  during bolus administration of intravenous contrast.  CONTRAST:  46m ISOVUE-300 IOPAMIDOL (ISOVUE-300) INJECTION 61%, 1043mISOVUE-300 IOPAMIDOL (ISOVUE-300) INJECTION 61%  COMPARISON:  CT 11/05/2015, PET-CT 01/18/2015  FINDINGS: CT CHEST FINDINGS  Cardiovascular: Coronary artery calcification and aortic atherosclerotic calcification.  Mediastinum/Nodes: No axillary supraclavicular adenopathy. No mediastinal hilar adenopathy. No pericardial fluid esophagus.  Lungs/Pleura: 4 mm subpleural nodule in the RIGHT upper lobe (image 47, series 3) is increased from 3 mm on prior. LEFT lower lobe 2 mm nodule image 105, series 3 is unchanged.  Musculoskeletal: No aggressive osseous lesion.  CT ABDOMEN AND PELVIS FINDINGS  Hepatobiliary: New lesion in the LEFT lateral hepatic lobe measures 3.3 by 2.6 cm and bulges the capsule (image 57, series 2). This new lesion is inferior to the previously described hypodense lesion measuring 3.0 cm which is stable compared to prior. No additional new lesions are identified. Portal veins are patent.  Pancreas: Pancreas is normal. No ductal dilatation. No pancreatic inflammation.  Spleen: Normal spleen  Adrenals/urinary tract: Adrenal glands and kidneys are normal. The ureters and bladder normal.  Stomach/Bowel: Stomach, duodenum small bowel are normal.  There is a RIGHT lateral abdominal wall laxity. There is a RIGHT lower quadrant ileostomy without obstruction.  Along the descending colon there is a lobular mass measuring 3.1 by 2.8 cm which compares to 3.3 x 3.1 cm on CT of 09/24/2015. This lesion was hypermetabolic on comparison PET-CT scan.  Vascular/Lymphatic: Abdominal aorta is normal caliber. There is no retroperitoneal or periportal lymphadenopathy. No pelvic lymphadenopathy.  Reproductive: Post hysterectomy  Other: No peritoneal metastasis identified  Musculoskeletal: No aggressive osseous lesion.  IMPRESSION: Chest Impression:  1. Interval increase in size and prominence of RIGHT upper lobe pulmonary nodule measuring 4 mm is highly concerning for pulmonary metastasis. 2. No mediastinal lymphadenopathy  Abdomen / Pelvis Impression:  1. New enhancing lesion in the LEFT lateral hepatic lobe consistent with progression of hepatic metastasis. 2. Stable mass along the descending colon was hypermetabolic on comparison PET-CT scan. 3. RIGHT lower quadrant ileostomy without complication.   Electronically Signed   By: StSuzy Bouchard.D.   On: 01/09/2016 17:23    ASSESSMENT and THERAPY PLAN:  Colon carcinoma with permanent ostomy Diversion Colitis Achalasia Iron deficiency, anemia secondary to GI related blood loss Hemeoccult positive stools Rising CEA with CT imaging on 07/13/2014 1.3 cm mass in L hepatic lobe Subcapsular mass in anterior pole of L kidney suspicious for RCC Biopsy of L liver lesion and MWA Left liver lesion 09/01/2014 Taste Alteration KRAS WT XELODA therapy, week on, week off started 01/2015 Progressive disease Difficulty discussing [facility] ? Vectibix induced Hypomagnesemia secondary to Vectibix therapy  She is doing ok on irinotecan. Note that dose is reduced. Proceed with next cycle today.   Will continue to  replace magnesium IV as needed.  I have discussed discontinuing therapy many times with the patient and she is reluctant to do so. She feels a great need to continue to care for her husband. She states that she is taking her oral magnesium.   Continue to follow CEA as this as been realiable in regards to disease response and progression.   We discussed end of life issues again, she remains resistant. I unfortunately do not think she will ever come to terms with end of life issues or DNR status.  I encouraged her to start using her Lomotil again.   She will return for a follow up  on 02/22/16.   Meds ordered this encounter  Medications  . magnesium sulfate IVPB 2 g 50 mL   All questions were answered. The patient knows to call the clinic with any problems, questions or concerns. We can certainly see the patient much sooner if necessary.  This document serves as a record of services personally performed by Forest Gleason, MD. It was created on her behalf by Martinique Casey, a trained medical scribe. The creation of this record is based on the scribe's personal observations and the provider's statements to them. This document has been checked and approved by the attending provider.  I have reviewed the above documentation for accuracy and completeness, and I agree with the above.  This document was electronically signed.   Kelby Fam. Whitney Muse, MD

## 2016-02-15 NOTE — Progress Notes (Signed)
Nutrition Assessment   Reason for Assessment:   Patient identified on Malnutrition Screening Tool for poor appetite and weight loss  ASSESSMENT:  83 year old with stage IV colon cancer with permanent ostomy with metastatic disease.  Patient receiving chemotherapy and seen during infusion.  Past medical history of anemia, CHF, CKD stage III, DM, GERD, HLD, HTN  Patient reports that does not really have much of an appetite, "nothing taste good." Reports that she typically has cereal or cereal bar or boiled eggs for breakfast, then may have peanut butter crackers later in the day or skips lunch all together, cooks supper for husband of meat and vegetables and she may eat it or not.  Likes ensure but does not drink it every day.    Medications: MVI, omega 3, zofran, protonix Receiving aloxi, mag sulfate and decadron today  Labs: glucose 115, Mag 1.4, hgb 9.5  Anthropometrics:   Height: 60 inches Weight: 139 lb patient feels like increase in weight is due to all the clothes and boots she has on UBW: reports when she was in her 60s she was a size 14 BMI: 27 Noted weight on 12/29 136 pounds, 12/1 134 lb, 11/10 133 lb, 10/27 136 lb  Estimated Energy Needs  Kcals: [id number] kcals/d Protein: 75-95 g/d Fluid: 1.8 L/d  NUTRITION DIAGNOSIS: Inadequate oral intake related to poor appetite as evidenced by pt with "no taste for foods" and skipping meals.    INTERVENTION:   Provided patient with "Taste Change" handout and "Poor Appetite" handout.  Ensure coupons given and discussed ensure program Will order first complimentary case of ensure plus. Discussed with patient and daughter limit of 3 cases.      MONITORING, EVALUATION, GOAL: Patient will consume adequate calories to prevent weight loss   NEXT VISIT:  Feb 9th during infusion  Karen [name] B. Karen [name], [place], Karen [name] (pager)

## 2016-02-19 ENCOUNTER — Telehealth (HOSPITAL_COMMUNITY): Payer: Self-pay | Admitting: *Deleted

## 2016-02-19 NOTE — Telephone Encounter (Signed)
Spoke with Dr. Whitney Muse regarding patients symptoms.  Returned call to patient to advise that her symptoms are coming from her current treatment.  Continue taking her blood pressure medications as prescribed.  Stay hydrated and call us back if she has any other concerns. Patient verbalizes understanding.

## 2016-02-22 ENCOUNTER — Encounter (HOSPITAL_BASED_OUTPATIENT_CLINIC_OR_DEPARTMENT_OTHER): Payer: Medicare Other

## 2016-02-22 ENCOUNTER — Ambulatory Visit (HOSPITAL_COMMUNITY): Payer: Medicare Other | Admitting: Oncology

## 2016-02-22 VITALS — BP 133/54 | HR 69 | Temp 98.0°F | Resp 18 | Wt 133.0 lb

## 2016-02-22 DIAGNOSIS — C189 Malignant neoplasm of colon, unspecified: Secondary | ICD-10-CM

## 2016-02-22 DIAGNOSIS — C787 Secondary malignant neoplasm of liver and intrahepatic bile duct: Secondary | ICD-10-CM

## 2016-02-22 DIAGNOSIS — Z5111 Encounter for antineoplastic chemotherapy: Secondary | ICD-10-CM | POA: Diagnosis not present

## 2016-02-22 DIAGNOSIS — D509 Iron deficiency anemia, unspecified: Secondary | ICD-10-CM | POA: Diagnosis not present

## 2016-02-22 LAB — CBC WITH DIFFERENTIAL/PLATELET
BASOS PCT: 0 %
Basophils Absolute: 0 10*3/uL (ref 0.0–0.1)
Eosinophils Absolute: 0.1 10*3/uL (ref 0.0–0.7)
Eosinophils Relative: 3 %
HEMATOCRIT: 29.1 % — AB (ref 36.0–46.0)
Hemoglobin: 9.5 g/dL — ABNORMAL LOW (ref 12.0–15.0)
LYMPHS ABS: 0.8 10*3/uL (ref 0.7–4.0)
Lymphocytes Relative: 25 %
MCH: 29.7 pg (ref 26.0–34.0)
MCHC: 32.6 g/dL (ref 30.0–36.0)
MCV: 90.9 fL (ref 78.0–100.0)
MONOS PCT: 8 %
Monocytes Absolute: 0.3 10*3/uL (ref 0.1–1.0)
NEUTROS ABS: 2.1 10*3/uL (ref 1.7–7.7)
Neutrophils Relative %: 64 %
Platelets: 184 10*3/uL (ref 150–400)
RBC: 3.2 MIL/uL — ABNORMAL LOW (ref 3.87–5.11)
RDW: 13.1 % (ref 11.5–15.5)
WBC: 3.2 10*3/uL — ABNORMAL LOW (ref 4.0–10.5)

## 2016-02-22 LAB — COMPREHENSIVE METABOLIC PANEL
ALBUMIN: 3.3 g/dL — AB (ref 3.5–5.0)
ALK PHOS: 56 U/L (ref 38–126)
ALT: 13 U/L — ABNORMAL LOW (ref 14–54)
ANION GAP: 5 (ref 5–15)
AST: 19 U/L (ref 15–41)
BILIRUBIN TOTAL: 0.3 mg/dL (ref 0.3–1.2)
BUN: 15 mg/dL (ref 6–20)
CALCIUM: 8.7 mg/dL — AB (ref 8.9–10.3)
CO2: 28 mmol/L (ref 22–32)
CREATININE: 0.9 mg/dL (ref 0.44–1.00)
Chloride: 106 mmol/L (ref 101–111)
GFR calc Af Amer: >60 mL/min (ref >60)
GFR calc non Af Amer: 57 mL/min — ABNORMAL LOW (ref >60)
GLUCOSE: 122 mg/dL — AB (ref 65–99)
Potassium: 3.7 mmol/L (ref 3.5–5.1)
Sodium: 139 mmol/L (ref 135–145)
TOTAL PROTEIN: 6.1 g/dL — AB (ref 6.5–8.1)

## 2016-02-22 LAB — MAGNESIUM: Magnesium: 1.5 mg/dL — ABNORMAL LOW (ref 1.7–2.4)

## 2016-02-22 MED ORDER — DEXAMETHASONE SODIUM PHOSPHATE 10 MG/ML IJ SOLN
10.0000 mg | Freq: Once | INTRAMUSCULAR | Status: AC
  Administered 2016-02-22: 10 mg via INTRAVENOUS
  Filled 2016-02-22: qty 1

## 2016-02-22 MED ORDER — SODIUM CHLORIDE 0.9 % IV SOLN
Freq: Once | INTRAVENOUS | Status: AC
  Administered 2016-02-22: 12:00:00 via INTRAVENOUS

## 2016-02-22 MED ORDER — ATROPINE SULFATE 1 MG/ML IJ SOLN
0.5000 mg | Freq: Once | INTRAMUSCULAR | Status: AC | PRN
  Administered 2016-02-22: 0.5 mg via INTRAVENOUS
  Filled 2016-02-22: qty 1

## 2016-02-22 MED ORDER — DEXTROSE 5 % IV SOLN
65.0000 mg/m2 | Freq: Once | INTRAVENOUS | Status: AC
  Administered 2016-02-22: 100 mg via INTRAVENOUS
  Filled 2016-02-22: qty 5

## 2016-02-22 MED ORDER — HEPARIN SOD (PORK) LOCK FLUSH 100 UNIT/ML IV SOLN
500.0000 [IU] | Freq: Once | INTRAVENOUS | Status: AC | PRN
  Administered 2016-02-22: 500 [IU]
  Filled 2016-02-22: qty 5

## 2016-02-22 MED ORDER — MAGNESIUM SULFATE 2 GM/50ML IV SOLN
2.0000 g | Freq: Once | INTRAVENOUS | Status: AC
  Administered 2016-02-22: 2 g via INTRAVENOUS
  Filled 2016-02-22: qty 50

## 2016-02-22 MED ORDER — SODIUM CHLORIDE 0.9 % IV SOLN
2.0000 g | Freq: Once | INTRAVENOUS | Status: DC
  Filled 2016-02-22: qty 4

## 2016-02-22 MED ORDER — SODIUM CHLORIDE 0.9% FLUSH
10.0000 mL | INTRAVENOUS | Status: DC | PRN
  Administered 2016-02-22: 10 mL
  Filled 2016-02-22: qty 10

## 2016-02-22 MED ORDER — PALONOSETRON HCL INJECTION 0.25 MG/5ML
0.2500 mg | Freq: Once | INTRAVENOUS | Status: AC
  Administered 2016-02-22: 0.25 mg via INTRAVENOUS
  Filled 2016-02-22: qty 5

## 2016-02-22 MED ORDER — SODIUM CHLORIDE 0.9 % IV SOLN: 10.0000 mg | Freq: Once | INTRAVENOUS | Status: DC

## 2016-02-22 NOTE — Patient Instructions (Signed)
[facility] Discharge Instructions for Patients Receiving Chemotherapy   Beginning January 23rd 2017 lab work for the Cancer Center will be done in the  Main lab at  on 1st floor. If you have a lab appointment with the Cancer Center please come in thru the  Main Entrance and check in at the main information desk   Today you received the following chemotherapy agents Irinotecan. Follow-up as scheduled. Call clinic for any questions or concerns  To help prevent nausea and vomiting after your treatment, we encourage you to take your nausea medication   If you develop nausea and vomiting, or diarrhea that is not controlled by your medication, call the clinic.  The clinic phone number is [phone]. Office hours are Monday-Friday 8:30am-5:00pm.  BELOW ARE SYMPTOMS THAT SHOULD BE REPORTED IMMEDIATELY:  *FEVER GREATER THAN 101.0 F  *CHILLS WITH OR WITHOUT FEVER  NAUSEA AND VOMITING THAT IS NOT CONTROLLED WITH YOUR NAUSEA MEDICATION  *UNUSUAL SHORTNESS OF BREATH  *UNUSUAL BRUISING OR BLEEDING  TENDERNESS IN MOUTH AND THROAT WITH OR WITHOUT PRESENCE OF ULCERS  *URINARY PROBLEMS  *BOWEL PROBLEMS  UNUSUAL RASH Items with * indicate a potential emergency and should be followed up as soon as possible. If you have an emergency after office hours please contact your primary care physician or go to the nearest emergency department.  Please call the clinic during office hours if you have any questions or concerns.   You may also contact the Patient Navigator at [phone] should you have any questions or need assistance in obtaining follow up care.      Resources For Cancer Patients and their Caregivers ? American Cancer Society: Can assist with transportation, wigs, general needs, runs Look Good Feel Better.        1-888-227-6333 ? Cancer Care: Provides financial assistance, online support groups, medication/co-pay assistance.  1-800-813-HOPE  (4673) ? [facility] Assists [place] cancer patients and their families through emotional , educational and financial support.  336-427-4357 ? [place] DSS Where to apply for food stamps, Medicaid and utility assistance. 336-342-1394 ? RCATS: Transportation to medical appointments. 336-347-2287 ? Social Security Administration: May apply for disability if have a Stage IV cancer. 336-342-7796 1-800-772-1213 ? [place] Aging, Disability and Transit Services: Assists with nutrition, care and transit needs. 336-349-2343         

## 2016-02-22 NOTE — Progress Notes (Signed)
Karen [name] tolerated chemo tx and Magnesium infusion well. She did experience some hoarseness after Atropine given but otherwise no complaints. VSS upon discharge. Pt discharged via wheelchair in satisfactory condition accompanied by her daughter

## 2016-02-29 ENCOUNTER — Ambulatory Visit (HOSPITAL_COMMUNITY): Payer: Medicare Other

## 2016-02-29 ENCOUNTER — Ambulatory Visit (HOSPITAL_COMMUNITY): Payer: Medicare Other | Admitting: Oncology

## 2016-02-29 ENCOUNTER — Other Ambulatory Visit (HOSPITAL_COMMUNITY): Payer: Self-pay

## 2016-02-29 DIAGNOSIS — C787 Secondary malignant neoplasm of liver and intrahepatic bile duct: Principal | ICD-10-CM

## 2016-02-29 DIAGNOSIS — C189 Malignant neoplasm of colon, unspecified: Secondary | ICD-10-CM

## 2016-02-29 MED ORDER — HYDROCODONE-ACETAMINOPHEN 7.5-325 MG PO TABS: 1.0000 | ORAL_TABLET | ORAL | 0 refills | Status: DC | PRN

## 2016-02-29 MED ORDER — FENTANYL 25 MCG/HR TD PT72: 25.0000 ug | MEDICATED_PATCH | TRANSDERMAL | 0 refills | Status: DC

## 2016-03-03 ENCOUNTER — Ambulatory Visit (HOSPITAL_COMMUNITY): Payer: Medicare Other

## 2016-03-03 ENCOUNTER — Ambulatory Visit (HOSPITAL_COMMUNITY): Payer: Medicare Other | Admitting: Hematology & Oncology

## 2016-03-07 ENCOUNTER — Ambulatory Visit (HOSPITAL_COMMUNITY): Payer: Medicare Other

## 2016-03-07 ENCOUNTER — Encounter (HOSPITAL_BASED_OUTPATIENT_CLINIC_OR_DEPARTMENT_OTHER): Payer: Medicare Other | Admitting: Oncology

## 2016-03-07 ENCOUNTER — Encounter (HOSPITAL_BASED_OUTPATIENT_CLINIC_OR_DEPARTMENT_OTHER): Payer: Medicare Other

## 2016-03-07 ENCOUNTER — Ambulatory Visit (HOSPITAL_COMMUNITY): Payer: Medicare Other | Admitting: Hematology & Oncology

## 2016-03-07 ENCOUNTER — Encounter (HOSPITAL_COMMUNITY): Payer: Self-pay | Admitting: Oncology

## 2016-03-07 VITALS — BP 111/51 | HR 67 | Temp 97.4°F | Resp 16 | Wt 126.0 lb

## 2016-03-07 DIAGNOSIS — D509 Iron deficiency anemia, unspecified: Secondary | ICD-10-CM | POA: Diagnosis not present

## 2016-03-07 DIAGNOSIS — C787 Secondary malignant neoplasm of liver and intrahepatic bile duct: Secondary | ICD-10-CM

## 2016-03-07 DIAGNOSIS — Z5111 Encounter for antineoplastic chemotherapy: Secondary | ICD-10-CM

## 2016-03-07 DIAGNOSIS — C189 Malignant neoplasm of colon, unspecified: Secondary | ICD-10-CM | POA: Diagnosis not present

## 2016-03-07 DIAGNOSIS — R634 Abnormal weight loss: Secondary | ICD-10-CM | POA: Diagnosis not present

## 2016-03-07 LAB — CBC WITH DIFFERENTIAL/PLATELET
BASOS ABS: 0 10*3/uL (ref 0.0–0.1)
Basophils Relative: 0 %
EOS PCT: 2 %
Eosinophils Absolute: 0.1 10*3/uL (ref 0.0–0.7)
HCT: 32.5 % — ABNORMAL LOW (ref 36.0–46.0)
Hemoglobin: 10.5 g/dL — ABNORMAL LOW (ref 12.0–15.0)
LYMPHS PCT: 20 %
Lymphs Abs: 0.9 10*3/uL (ref 0.7–4.0)
MCH: 29.2 pg (ref 26.0–34.0)
MCHC: 32.3 g/dL (ref 30.0–36.0)
MCV: 90.5 fL (ref 78.0–100.0)
MONO ABS: 0.7 10*3/uL (ref 0.1–1.0)
Monocytes Relative: 14 %
Neutro Abs: 2.9 10*3/uL (ref 1.7–7.7)
Neutrophils Relative %: 64 %
PLATELETS: 176 10*3/uL (ref 150–400)
RBC: 3.59 MIL/uL — AB (ref 3.87–5.11)
RDW: 13.8 % (ref 11.5–15.5)
WBC: 4.6 10*3/uL (ref 4.0–10.5)

## 2016-03-07 LAB — COMPREHENSIVE METABOLIC PANEL
ALT: 16 U/L (ref 14–54)
AST: 25 U/L (ref 15–41)
Albumin: 3.7 g/dL (ref 3.5–5.0)
Alkaline Phosphatase: 74 U/L (ref 38–126)
Anion gap: 8 (ref 5–15)
BUN: 22 mg/dL — ABNORMAL HIGH (ref 6–20)
CHLORIDE: 108 mmol/L (ref 101–111)
CO2: 22 mmol/L (ref 22–32)
Calcium: 9.4 mg/dL (ref 8.9–10.3)
Creatinine, Ser: 1.09 mg/dL — ABNORMAL HIGH (ref 0.44–1.00)
GFR, EST AFRICAN AMERICAN: 52 mL/min — AB (ref >60)
GFR, EST NON AFRICAN AMERICAN: 45 mL/min — AB (ref >60)
Glucose, Bld: 124 mg/dL — ABNORMAL HIGH (ref 65–99)
POTASSIUM: 4.3 mmol/L (ref 3.5–5.1)
SODIUM: 138 mmol/L (ref 135–145)
Total Bilirubin: 0.7 mg/dL (ref 0.3–1.2)
Total Protein: 7 g/dL (ref 6.5–8.1)

## 2016-03-07 LAB — MAGNESIUM: MAGNESIUM: 1.6 mg/dL — AB (ref 1.7–2.4)

## 2016-03-07 MED ORDER — ATROPINE SULFATE 1 MG/ML IJ SOLN
0.5000 mg | Freq: Once | INTRAMUSCULAR | Status: AC | PRN
  Administered 2016-03-07: 0.5 mg via INTRAVENOUS
  Filled 2016-03-07: qty 1

## 2016-03-07 MED ORDER — SODIUM CHLORIDE 0.9 % IV SOLN: 10.0000 mg | Freq: Once | INTRAVENOUS | Status: DC

## 2016-03-07 MED ORDER — MAGNESIUM OXIDE 400 (241.3 MG) MG PO TABS
400.0000 mg | ORAL_TABLET | Freq: Once | ORAL | Status: AC
  Administered 2016-03-07: 400 mg via ORAL
  Filled 2016-03-07: qty 1

## 2016-03-07 MED ORDER — HEPARIN SOD (PORK) LOCK FLUSH 100 UNIT/ML IV SOLN
500.0000 [IU] | Freq: Once | INTRAVENOUS | Status: AC | PRN
  Administered 2016-03-07: 500 [IU]
  Filled 2016-03-07 (×2): qty 5

## 2016-03-07 MED ORDER — IRINOTECAN HCL CHEMO INJECTION 100 MG/5ML
65.0000 mg/m2 | Freq: Once | INTRAVENOUS | Status: AC
  Administered 2016-03-07: 100 mg via INTRAVENOUS
  Filled 2016-03-07: qty 5

## 2016-03-07 MED ORDER — DEXAMETHASONE SODIUM PHOSPHATE 10 MG/ML IJ SOLN
10.0000 mg | Freq: Once | INTRAMUSCULAR | Status: AC
  Administered 2016-03-07: 10 mg via INTRAVENOUS
  Filled 2016-03-07: qty 1

## 2016-03-07 MED ORDER — PALONOSETRON HCL INJECTION 0.25 MG/5ML
0.2500 mg | Freq: Once | INTRAVENOUS | Status: AC
  Administered 2016-03-07: 0.25 mg via INTRAVENOUS
  Filled 2016-03-07: qty 5

## 2016-03-07 MED ORDER — SODIUM CHLORIDE 0.9% FLUSH: 10.0000 mL | INTRAVENOUS | Status: DC | PRN

## 2016-03-07 MED ORDER — SODIUM CHLORIDE 0.9 % IV SOLN
Freq: Once | INTRAVENOUS | Status: AC
  Administered 2016-03-07: 11:00:00 via INTRAVENOUS

## 2016-03-07 NOTE — Assessment & Plan Note (Addendum)
Stage IV CRC, KRAS wild-type, S/P microwave ablation of a solitary liver lesion on 09/01/2014.  S/P palliative Xeloda 1500 mg BID 7 days on and 7 days off beginning 01/2015 with progression of disease identified in March 2017 resulting in a discontinuation in [place] therapy.  She was then on Vectibix every 2 weeks beginning on 04/20/2015- 01/07/2016.  Restaging CT imaging on 01/09/2016 demonstrated new hepatic disease leading to a change in therapy to Irinotecan on 02/08/2016.  Oncology history updated.  Pre-treatment labs: CBC diff, CMET, Magnesium.  I personally reviewed and went over laboratory results with the patient.  The results are noted within this dictation.  CEA is ordered for day 1 of each cycle.  Goals of care are yet again addresses and her goal is to "get better."  Again, she is educated that her disease is incurable.  "But God can cure it."  Code Status has been discussed on multiple occassions and she remains very unrealistic regarding goals of care.  "I want to live as long as I can."  She wants to remain a FULL CODE.  "The good Lord will take me when he is ready, even if I'm on a respiratory."   Weight loss is noted and Rd is following patient along.  Return in 2 weeks to embark on Day 1 of cycle 2.  Return as scheduled for follow-up with ongoing discussions regarding goals of care.

## 2016-03-07 NOTE — Progress Notes (Signed)
Karen Hilding, MD [place] [address]  Colon cancer metastasized to liver [facility]) - Plan: CBC with Differential, Comprehensive metabolic panel, Magnesium, CEA  Hypomagnesemia - Plan: magnesium oxide (MAG-OX) tablet 400 mg  CURRENT THERAPY: Palliative Irinotecan days 1, 8, 15, 22 every 42 days beginning on 02/08/2016  INTERVAL HISTORY: Karen [name] 83 y.o. female returns for followup of Stage IV CRC, KRAS wild-type, S/P microwave ablation of a solitary liver lesion on 09/01/2014.  S/P palliative Xeloda 1500 mg BID 7 days on and 7 days off beginning 01/2015 with progression of disease identified in March 2017 resulting in a discontinuation in [place] therapy.  She was then on Vectibix every 2 weeks beginning on 04/20/2015- 01/07/2016.  Restaging CT imaging on 01/09/2016 demonstrated new hepatic disease leading to a change in therapy to Irinotecan on 02/08/2016.    Colon cancer metastasized to liver [facility])   04/12/1987 Definitive Surgery    Dr. Lindalou Hose at [facility]      09/01/2014 Pathology Results    Liver, needle/core biopsy - METASTATIC ADENOCARCINOMA.      09/01/2014 Miscellaneous    MWA left liver lesion, [name] (IR)      01/18/2015 Progression    PET scan      01/18/2015 PET scan    Recurrence of  tumor within segment 2 of the liver. There are 2 new lesions identified within segment 5 and segment 6 of the liver worrisome for additional sites of metastasis. Evidence of tumor recurrence at the intra colonic anastomosis is noted...      01/28/2015 - 04/16/2015 Chemotherapy    Xeloda 1500 mg BID 7 days on and 7 days off beginning in December 2016.      04/12/2015 Imaging    CT C/A/P progression of disease, enlargement of metastatic lesion in segments 2/3 of liver, development of mass with colon at junction of descending colon and sigmoid, indeterminate lesion in lower pole of the left kidney      04/20/2015 - 01/07/2016 Antibody Plan   Vectibix every 2 weeks, single-agent.      06/12/2015 - 06/13/2015 Hospital Admission    Hypotension, diarrhea, ARF, UTI      06/15/2015 Adverse Reaction    Vectibix rash      06/15/2015 Treatment Plan Change    Vectibix reduced to 5 mg/m2      07/13/2015 Treatment Plan Change    Treatment deferred due to hypomagnesemia and palpitations.  Escorted to ED.      09/24/2015 Imaging    CT abd/pelvis-  Liver lesions as follows:  Segment 2 left liver lobe hypodense 3.5 x 2.2 cm liver mass, previously 3.1 x 2.0 cm, mildly increased. Medial segment 6 right liver lobe 1.7 x 0.9 cm hypodense liver mass, new. Peripheral segment 6 right liver lobe 1.0 x 0.7 cm hypodense liver mass, new. Serosal 0.7 cm segment 5 right liver lobe mass, previously 0.6 cm, not appreciably changed. Hypodense 0.6 cm liver lesion adjacent to the IVC, previously 0.6 cm, unchanged.      09/24/2015 Imaging    CT chest- New 3 mm right upper lobe pulmonary nodule, indeterminate, pulmonary metastasis not excluded.      11/04/2015 Imaging    CT abd/pelvis- Mechanical small-bowel obstruction at the level of the mid small bowel, probably due to adhesions. No free air. No fluid collections. 2. Liver metastases appear stable. No new or progressive metastatic disease in the abdomen or pelvis.  01/09/2016 Imaging    CT CAP- Interval increase in size and prominence of RIGHT upper lobe pulmonary nodule measuring 4 mm is highly concerning for pulmonary metastasis. New enhancing lesion in the LEFT lateral hepatic lobe consistent with progression of hepatic metastasis. Stable mass along the descending colon was hypermetabolic on comparison PET-CT scan.      01/09/2016 Progression    CT imaging demonstrates new hepatic lobe lesion      02/08/2016 -  Chemotherapy    The patient had palonosetron (ALOXI) injection 0.25 mg, 0.25 mg, Intravenous,  Once, 1 of 4 cycles  irinotecan (CAMPTOSAR) 100 mg in dextrose 5 % 500 mL chemo infusion, 65  mg/m2 = 100 mg (100 % of original dose 65 mg/m2), Intravenous,  Once, 1 of 4 cycles Dose modification: 100 mg/m2 (original dose 65 mg/m2, Cycle 1, Reason: Provider Judgment), 75 mg/m2 (original dose 65 mg/m2, Cycle 1, Reason: Provider Judgment), 65 mg/m2 (original dose 65 mg/m2, Cycle 1, Reason: Provider Judgment), 75 mg/m2 (original dose 65 mg/m2, Cycle 1, Reason: Provider Judgment)  for chemotherapy treatment.         She is here for Day 22 of cycle 1.  She is tolerating treatment well without any significant diarrhea.  She notes that her appetite is strong, but her weight is down.  We will need to monitor closely moving forward.  She reports eating frequently throughout the day.  She denies any nausea or vomiting.    Review of Systems  Constitutional: Positive for weight loss. Negative for chills and fever.  HENT: Negative.   Eyes: Negative.   Respiratory: Negative.  Negative for cough.   Cardiovascular: Negative.  Negative for chest pain.  Gastrointestinal: Negative.  Negative for constipation, diarrhea, nausea and vomiting.  Genitourinary: Negative.   Musculoskeletal: Negative.  Negative for falls.  Skin: Negative.   Neurological: Negative.  Negative for weakness.  Endo/Heme/Allergies: Negative.   Psychiatric/Behavioral: Negative.     Past Medical History:  Diagnosis Date  . Abdominal adhesions   . Achalasia    a. s/p botox injection for achalasia.  . Anemia   . Arthritis   . Asthma    hx of  . Blood transfusion   . Breast lump   . Cancer ([place])    bladder  . Chest pain    a. 2002 Cath: nl cors;  b. 2009 aden mv: nl;  c. 05/2009 Echo: nl. d. 2014: normal nuclear stress test, EF 69%.  . Chronic diastolic CHF (congestive heart failure) (HCC)    a. nl EF by echo 05/2009.  . CKD (chronic kidney disease), stage III    pt. states decreased kidney function  . Colon cancer ([place])    a. recurrence 2016 with liver mets -  percutaneous liver biopsy and thermal ablation of a liver  metastasis by interventional radiology..  . Colon polyp   . Depression   . Diabetes mellitus without complication ([place]) 5/36/14   borderline type II; takes no medication for it  . DVT of deep femoral vein ([place])   . Family history of bladder cancer   . Family history of breast cancer   . Family history of colon cancer   . Fibromyalgia   . GERD (gastroesophageal reflux disease)   . Headache   . Hearing loss   . Hernia   . History of blood clots   . History of PSVT (paroxysmal supraventricular tachycardia)   . Hyperlipidemia   . Hypertension   . Ileostomy present ([place])   .  Iron deficiency anemia due to chronic blood loss 07/06/2013  . LBBB (left bundle branch block)   . Obstruction of intestine or colon 06/2014  . Sinus bradycardia    a. HR 30s in 08/2014 requiring holding of digoxin.  Marland Kitchen Thyroid disease   . UTI (urinary tract infection)   . Vision abnormalities 2016   not going blind but having retina problems; might lose ability to see faces  . Wears dentures     Past Surgical History:  Procedure Laterality Date  . ABDOMINAL HYSTERECTOMY    . APPENDECTOMY    . BOTOX INJECTION N/A 09/01/2013   Procedure: BOTOX INJECTION;  Surgeon: Rogene Houston, MD;  Location: [facility] ENDO SUITE;  Service: Endoscopy;  Laterality: N/A;  . BREAST SURGERY     right breast biopsy  . CARDIAC CATHETERIZATION  12/10/2000   THE LEFT VENTRICLE IS MILDY DILATED. THERE IS MILD TO MODERATE DIFFUSE HYPOKINESIS WITH EF 35%  . CATARACT EXTRACTION W/PHACO  11/13/2011   Procedure: CATARACT EXTRACTION PHACO AND INTRAOCULAR LENS PLACEMENT (IOC);  Surgeon: Tonny Branch, MD;  Location: [facility] ORS;  Service: Ophthalmology;  Laterality: Right;  CDE 12.26  . CATARACT EXTRACTION W/PHACO  12/08/2011   Procedure: CATARACT EXTRACTION PHACO AND INTRAOCULAR LENS PLACEMENT (IOC);  Surgeon: Tonny Branch, MD;  Location: [facility] ORS;  Service: Ophthalmology;  Laterality: Left;  CDE 15.11  . CHOLECYSTECTOMY    . COLON CANCER SURGERY    . COLON  SURGERY    . COLONOSCOPY  09/26/2011   Procedure: COLONOSCOPY;  Surgeon: Rogene Houston, MD;  Location: [facility] END[facility] SUITE;  Service: Endoscopy;  Laterality: N/A;  215  . CORONARY ANGIOPLASTY    . ESOPHAGEAL DILATION N/A 01/18/2016   Procedure: ESOPHAGEAL DILATION;  Surgeon: Rogene Houston, MD;  Location: [facility];  Service: Endoscopy;  Laterality: N/A;  . ESOPHAGOGASTRODUODENOSCOPY  02/13/2011   Procedure: ESOPHAGOGASTRODUODENOSCOPY (EGD);  Surgeon: Rogene Houston, MD;  Location: [facility];  Service: Endoscopy;  Laterality: N/A;  1030  . ESOPHAGOGASTRODUODENOSCOPY N/A 09/01/2013   Procedure: ESOPHAGOGASTRODUODENOSCOPY (EGD);  Surgeon: Rogene Houston, MD;  Location: [facility];  Service: Endoscopy;  Laterality: N/A;  830  . ESOPHAGOGASTRODUODENOSCOPY N/A 01/18/2016   Procedure: ESOPHAGOGASTRODUODENOSCOPY (EGD);  Surgeon: Rogene Houston, MD;  Location: [facility];  Service: Endoscopy;  Laterality: N/A;  1120  . EYE SURGERY  2014   catarac surgery  . ILEOSTOMY    . ILEOSTOMY    . JOINT REPLACEMENT Left 2008  . liver biopsy and ablation  09/01/14  . ovarian cystectomy 1955    . PORTACATH PLACEMENT Left 02/08/2016   Procedure: INSERTION PORT-A-CATH;  Surgeon: Aviva Signs, MD;  Location: [facility] ORS;  Service: General;  Laterality: Left;  . ROTATOR CUFF REPAIR    . TONSILLECTOMY      Family History  Problem Relation Age of Onset  . Heart disease Mother   . Stroke Mother     deceased  . Arthritis Mother   . Bladder Cancer Mother     dx in her 20s  . Colon cancer Mother   . Arthritis Father   . Heart disease Father     decesaed  . Leukemia Father   . Stroke Sister     alive/debilitated  . Hypertension Sister   . Other Sister     paralysis  . Heart disease Brother     bypass surgery  . Arthritis Brother   . Colon cancer Brother   . Bladder Cancer  Brother   . Stroke Sister     alive/debilitated  . Diabetes Sister   . Other Brother     stomach problems  . Other Brother      bladder  . Colon cancer Paternal Aunt   . Bladder Cancer Other     dx twice in her 30s-40s  . Breast cancer Other     great niece dx in her early 36s  . Prostate cancer Other     newphew  . Colon polyps Daughter   . Stroke    . Hypertension      Social History   Social History  . Marital status: Married    Spouse name: N/A  . Number of children: 4  . Years of education: N/A   Social History Main Topics  . Smoking status: Former Smoker    Packs/day: 0.25    Years: 13.00    Types: Cigarettes    Quit date: 02/11/1963  . Smokeless tobacco: Never Used  . Alcohol use No  . Drug use: No  . Sexual activity: No   Other Topics Concern  . None   Social History Narrative  . None     PHYSICAL EXAMINATION  ECOG PERFORMANCE STATUS: 1 - Symptomatic but completely ambulatory  There were no vitals filed for this visit.  Vitals - 1 value per visit 03/07/2016  SYSTOLIC 111  DIASTOLIC 51  Pulse 67  Temperature 97.4  Respirations 16  Weight (lb) 126    GENERAL:alert, no distress, well nourished, well developed, comfortable, cooperative, smiling and unaccompanied SKIN: skin color, texture, turgor are normal, no rashes or significant lesions HEAD: Normocephalic, No masses, lesions, tenderness or abnormalities EYES: normal, EOMI, Conjunctiva are pink and non-injected EARS: External ears normal OROPHARYNX:lips, buccal mucosa, and tongue normal and mucous membranes are moist  NECK: supple, no adenopathy, trachea midline LYMPH:  no palpable lymphadenopathy BREAST:not examined LUNGS: clear to auscultation  HEART: regular rate & rhythm ABDOMEN:abdomen soft and normal bowel sounds BACK: Back symmetric, no curvature. EXTREMITIES:less then 2 second capillary refill, no joint deformities, effusion, or inflammation, no skin discoloration, no cyanosis  NEURO: alert & oriented x 3 with fluent speech, no focal motor/sensory deficits, gait normal   LABORATORY DATA: CBC    Component  Value Date/Time   WBC 4.6 03/07/2016 0929   RBC 3.59 (L) 03/07/2016 0929   HGB 10.5 (L) 03/07/2016 0929   HCT 32.5 (L) 03/07/2016 0929   PLT 176 03/07/2016 0929   MCV 90.5 03/07/2016 0929   MCH 29.2 03/07/2016 0929   MCHC 32.3 03/07/2016 0929   RDW 13.8 03/07/2016 0929   LYMPHSABS 0.9 03/07/2016 0929   MONOABS 0.7 03/07/2016 0929   EOSABS 0.1 03/07/2016 0929   BASOSABS 0.0 03/07/2016 0929      Chemistry      Component Value Date/Time   NA 138 03/07/2016 0929   K 4.3 03/07/2016 0929   CL 108 03/07/2016 0929   CO2 22 03/07/2016 0929   BUN 22 (H) 03/07/2016 0929   CREATININE 1.09 (H) 03/07/2016 0929   CREATININE 0.95 (H) 12/07/2014 1512      Component Value Date/Time   CALCIUM 9.4 03/07/2016 0929   ALKPHOS 74 03/07/2016 0929   AST 25 03/07/2016 0929   ALT 16 03/07/2016 0929   BILITOT 0.7 03/07/2016 0929     Lab Results  Component Value Date   CEA 303.8 (H) 12/07/2015     PENDING LABS:   RADIOGRAPHIC STUDIES:  Dg Chest Port 1  View  Result Date: 02/08/2016 CLINICAL DATA:  Port-A-Cath placement EXAM: PORTABLE CHEST 1 VIEW COMPARISON:  He 01/24/2016 FINDINGS: Left subclavian Port-A-Cath tip in the mid SVC.  No pneumothorax. Cardiac enlargement. Negative for heart failure. Mild atelectasis in the lingula. Lungs otherwise clear. IMPRESSION: Satisfactory Port-A-Cath placement. Electronically Signed   By: Franchot Gallo M.D.   On: 02/08/2016 09:23   Dg C-arm 1-60 Min-no Report  Result Date: 02/08/2016 There is no Radiologist interpretation  for this exam.    PATHOLOGY:    ASSESSMENT AND PLAN:  Colon cancer metastasized to liver ([place]) Stage IV CRC, KRAS wild-type, S/P microwave ablation of a solitary liver lesion on 09/01/2014.  S/P palliative Xeloda 1500 mg BID 7 days on and 7 days off beginning 01/2015 with progression of disease identified in March 2017 resulting in a discontinuation in [place] therapy.  She was then on Vectibix every 2 weeks beginning on  04/20/2015- 01/07/2016.  Restaging CT imaging on 01/09/2016 demonstrated new hepatic disease leading to a change in therapy to Irinotecan on 02/08/2016.  Oncology history updated.  Pre-treatment labs: CBC diff, CMET, Magnesium.  I personally reviewed and went over laboratory results with the patient.  The results are noted within this dictation.  CEA is ordered for day 1 of each cycle.  Goals of care are yet again addresses and her goal is to "get better."  Again, she is educated that her disease is incurable.  "But God can cure it."  Code Status has been discussed on multiple occassions and she remains very unrealistic regarding goals of care.  "I want to live as long as I can."  She wants to remain a FULL CODE.  "The good Lord will take me when he is ready, even if I'm on a respiratory."   Weight loss is noted and Rd is following patient along.  Return in 2 weeks to embark on Day 1 of cycle 2.  Return as scheduled for follow-up with ongoing discussions regarding goals of care.   ORDERS PLACED FOR THIS ENCOUNTER: Orders Placed This Encounter  Procedures  . CBC with Differential  . Comprehensive metabolic panel  . Magnesium  . CEA    MEDICATIONS PRESCRIBED THIS ENCOUNTER: Meds ordered this encounter  Medications  . diphenoxylate-atropine (LOMOTIL) 2.5-0.025 MG tablet  . magnesium oxide (MAG-OX) tablet 400 mg    THERAPY PLAN:  Continue with salvage (last line therapy) as outlined above with ongoing discussions regarding goals of care.  All questions were answered. The patient knows to call the clinic with any problems, questions or concerns. We can certainly see the patient much sooner if necessary.  Patient and plan discussed with Dr. Ancil Linsey and she is in agreement with the aforementioned.   This note is electronically signed by: Doy Mince 03/07/2016 5:09 PM

## 2016-03-07 NOTE — Patient Instructions (Signed)
[facility] Discharge Instructions for Patients Receiving Chemotherapy   Beginning January 23rd 2017 lab work for the Cancer Center will be done in the  Main lab at [place] on 1st floor. If you have a lab appointment with the Cancer Center please come in thru the  Main Entrance and check in at the main information desk   Today you received the following chemotherapy agent: Irinotecan.     If you develop nausea and vomiting, or diarrhea that is not controlled by your medication, call the clinic.  The clinic phone number is [phone]. Office hours are Monday-Friday 8:30am-5:00pm.  BELOW ARE SYMPTOMS THAT SHOULD BE REPORTED IMMEDIATELY:  *FEVER GREATER THAN 101.0 F  *CHILLS WITH OR WITHOUT FEVER  NAUSEA AND VOMITING THAT IS NOT CONTROLLED WITH YOUR NAUSEA MEDICATION  *UNUSUAL SHORTNESS OF BREATH  *UNUSUAL BRUISING OR BLEEDING  TENDERNESS IN MOUTH AND THROAT WITH OR WITHOUT PRESENCE OF ULCERS  *URINARY PROBLEMS  *BOWEL PROBLEMS  UNUSUAL RASH Items with * indicate a potential emergency and should be followed up as soon as possible. If you have an emergency after office hours please contact your primary care physician or go to the nearest emergency department.  Please call the clinic during office hours if you have any questions or concerns.   You may also contact the Patient Navigator at [phone] should you have any questions or need assistance in obtaining follow up care.      Resources For Cancer Patients and their Caregivers ? American Cancer Society: Can assist with transportation, wigs, general needs, runs Look Good Feel Better.        1-888-227-6333 ? Cancer Care: Provides financial assistance, online support groups, medication/co-pay assistance.  1-800-813-HOPE (4673) ? [facility] Assists [place] cancer patients and their families through emotional , educational and financial support.   336-427-4357 ? [place] DSS Where to apply for food stamps, Medicaid and utility assistance. 336-342-1394 ? RCATS: Transportation to medical appointments. 336-347-2287 ? Social Security Administration: May apply for disability if have a Stage IV cancer. 336-342-7796 1-800-772-1213 ? [place] Aging, Disability and Transit Services: Assists with nutrition, care and transit needs. 336-349-2343          

## 2016-03-07 NOTE — Progress Notes (Signed)
Lab results reviewed with Robynn Pane, PA.  He states that patient is fine for treatment, despite decline in kidney function, and that there is no adjustment for decreased kidney function with irinotecan.  Treatment plan ordered as signed by MD.  PO magnesium given per PA order for slightly low magnesium level.  Patient tolerated infusion well.  VSS.  Patient ambulatory but wheeled out via wheelchair per preference, in stable condition.

## 2016-03-07 NOTE — Patient Instructions (Addendum)
[place] at [facility] Discharge Instructions  RECOMMENDATIONS MADE BY THE CONSULTANT AND ANY TEST RESULTS WILL BE SENT TO YOUR REFERRING PHYSICIAN.  You were seen today by Kirby Crigler PA-C. Treatment today. Return in 2-3 weeks for follow up.    Thank you for choosing [place] at [facility] to provide your oncology and hematology care.  To afford each patient quality time with our provider, please arrive at least 15 minutes before your scheduled appointment time.    If you have a lab appointment with the [facility] please come in thru the  Main Entrance and check in at the main information desk  You need to re-schedule your appointment should you arrive 10 or more minutes late.  We strive to give you quality time with our providers, and arriving late affects you and other patients whose appointments are after yours.  Also, if you no show three or more times for appointments you may be dismissed from the clinic at the providers discretion.     Again, thank you for choosing [facility]  Our hope is that these requests will decrease the amount of time that you wait before being seen by our physicians.       _____________________________________________________________  Should you have questions after your visit to [facility], please contact our office at [phone] between the hours of 8:30 a.m. and 4:30 p.m.  Voicemails left after 4:30 p.m. will not be returned until the following business day.  For prescription refill requests, have your pharmacy contact our office.       Resources For Cancer Patients and their Caregivers ? American Cancer Society: Can assist with transportation, wigs, general needs, runs Look Good Feel Better.        239-327-7062 ? Cancer Care: Provides financial assistance, online support groups, medication/co-pay assistance.  1-800-813-HOPE 367-501-1588) ? [facility] Assists [place] cancer patients and their families through emotional , educational and financial support.  (626)276-8678 ? [place] DSS Where to apply for food stamps, Medicaid and utility assistance. (567)494-3205 ? RCATS: Transportation to medical appointments. 417-223-8402 ? Social Security Administration: May apply for disability if have a Stage IV cancer. 606 390 5819 442-072-9916 ? [employer], Disability and Transit Services: Assists with nutrition, care and transit needs. [place] Support Programs: @10RELATIVEDAYS @ > Cancer Support Group  2nd Tuesday of the month 1pm-2pm, [name]  > Creative Journey  3rd Tuesday of the month 1130am-1pm, [name]  > Look Good Feel Better  1st Wednesday of the month 10am-12 noon, [name] (Call [place] to register [phone])

## 2016-03-08 ENCOUNTER — Encounter (HOSPITAL_COMMUNITY): Payer: Self-pay | Admitting: Hematology & Oncology

## 2016-03-10 ENCOUNTER — Other Ambulatory Visit (HOSPITAL_COMMUNITY): Payer: Self-pay

## 2016-03-10 DIAGNOSIS — C787 Secondary malignant neoplasm of liver and intrahepatic bile duct: Principal | ICD-10-CM

## 2016-03-10 DIAGNOSIS — C189 Malignant neoplasm of colon, unspecified: Secondary | ICD-10-CM

## 2016-03-12 ENCOUNTER — Encounter (HOSPITAL_COMMUNITY): Payer: Self-pay | Admitting: Hematology & Oncology

## 2016-03-21 ENCOUNTER — Encounter (HOSPITAL_COMMUNITY): Payer: Medicare Other | Attending: Hematology and Oncology

## 2016-03-21 ENCOUNTER — Encounter (HOSPITAL_COMMUNITY): Payer: Self-pay

## 2016-03-21 ENCOUNTER — Ambulatory Visit (HOSPITAL_COMMUNITY): Payer: Medicare Other | Admitting: Hematology & Oncology

## 2016-03-21 ENCOUNTER — Encounter (HOSPITAL_COMMUNITY): Payer: Medicare Other

## 2016-03-21 ENCOUNTER — Ambulatory Visit (HOSPITAL_COMMUNITY): Payer: Medicare Other

## 2016-03-21 VITALS — BP 114/48 | HR 67 | Temp 98.1°F | Resp 16 | Wt 127.2 lb

## 2016-03-21 DIAGNOSIS — Z5111 Encounter for antineoplastic chemotherapy: Secondary | ICD-10-CM

## 2016-03-21 DIAGNOSIS — C189 Malignant neoplasm of colon, unspecified: Secondary | ICD-10-CM | POA: Diagnosis not present

## 2016-03-21 DIAGNOSIS — D509 Iron deficiency anemia, unspecified: Secondary | ICD-10-CM | POA: Diagnosis not present

## 2016-03-21 DIAGNOSIS — C787 Secondary malignant neoplasm of liver and intrahepatic bile duct: Secondary | ICD-10-CM

## 2016-03-21 LAB — COMPREHENSIVE METABOLIC PANEL
ALBUMIN: 3.4 g/dL — AB (ref 3.5–5.0)
ALK PHOS: 67 U/L (ref 38–126)
ALT: 13 U/L — AB (ref 14–54)
ANION GAP: 7 (ref 5–15)
AST: 24 U/L (ref 15–41)
BILIRUBIN TOTAL: 0.5 mg/dL (ref 0.3–1.2)
BUN: 15 mg/dL (ref 6–20)
CALCIUM: 9 mg/dL (ref 8.9–10.3)
CO2: 24 mmol/L (ref 22–32)
CREATININE: 1.04 mg/dL — AB (ref 0.44–1.00)
Chloride: 112 mmol/L — ABNORMAL HIGH (ref 101–111)
GFR calc Af Amer: 56 mL/min — ABNORMAL LOW (ref >60)
GFR calc non Af Amer: 48 mL/min — ABNORMAL LOW (ref >60)
GLUCOSE: 125 mg/dL — AB (ref 65–99)
Potassium: 4.2 mmol/L (ref 3.5–5.1)
Sodium: 143 mmol/L (ref 135–145)
Total Protein: 6.4 g/dL — ABNORMAL LOW (ref 6.5–8.1)

## 2016-03-21 LAB — CBC WITH DIFFERENTIAL/PLATELET
BASOS PCT: 0 %
Basophils Absolute: 0 10*3/uL (ref 0.0–0.1)
Eosinophils Absolute: 0.1 10*3/uL (ref 0.0–0.7)
Eosinophils Relative: 2 %
HCT: 29.5 % — ABNORMAL LOW (ref 36.0–46.0)
HEMOGLOBIN: 9.7 g/dL — AB (ref 12.0–15.0)
Lymphocytes Relative: 24 %
Lymphs Abs: 0.8 10*3/uL (ref 0.7–4.0)
MCH: 29.7 pg (ref 26.0–34.0)
MCHC: 32.9 g/dL (ref 30.0–36.0)
MCV: 90.2 fL (ref 78.0–100.0)
MONOS PCT: 10 %
Monocytes Absolute: 0.3 10*3/uL (ref 0.1–1.0)
NEUTROS ABS: 2.1 10*3/uL (ref 1.7–7.7)
NEUTROS PCT: 64 %
Platelets: 166 10*3/uL (ref 150–400)
RBC: 3.27 MIL/uL — AB (ref 3.87–5.11)
RDW: 14.2 % (ref 11.5–15.5)
WBC: 3.3 10*3/uL — ABNORMAL LOW (ref 4.0–10.5)

## 2016-03-21 LAB — MAGNESIUM: Magnesium: 1.7 mg/dL (ref 1.7–2.4)

## 2016-03-21 MED ORDER — PALONOSETRON HCL INJECTION 0.25 MG/5ML
INTRAVENOUS | Status: AC
  Filled 2016-03-21: qty 5

## 2016-03-21 MED ORDER — DEXTROSE 5 % IV SOLN
65.0000 mg/m2 | Freq: Once | INTRAVENOUS | Status: AC
  Administered 2016-03-21: 100 mg via INTRAVENOUS
  Filled 2016-03-21: qty 5

## 2016-03-21 MED ORDER — ATROPINE SULFATE 1 MG/ML IJ SOLN
INTRAMUSCULAR | Status: AC
  Filled 2016-03-21: qty 1

## 2016-03-21 MED ORDER — ATROPINE SULFATE 1 MG/ML IJ SOLN
0.5000 mg | Freq: Once | INTRAMUSCULAR | Status: AC | PRN
  Administered 2016-03-21: 0.5 mg via INTRAVENOUS

## 2016-03-21 MED ORDER — SODIUM CHLORIDE 0.9 % IV SOLN
Freq: Once | INTRAVENOUS | Status: AC
  Administered 2016-03-21: 13:00:00 via INTRAVENOUS

## 2016-03-21 MED ORDER — PALONOSETRON HCL INJECTION 0.25 MG/5ML
0.2500 mg | Freq: Once | INTRAVENOUS | Status: AC
  Administered 2016-03-21: 0.25 mg via INTRAVENOUS

## 2016-03-21 MED ORDER — DEXAMETHASONE SODIUM PHOSPHATE 10 MG/ML IJ SOLN
10.0000 mg | Freq: Once | INTRAMUSCULAR | Status: AC
  Administered 2016-03-21: 10 mg via INTRAVENOUS

## 2016-03-21 MED ORDER — SODIUM CHLORIDE 0.9% FLUSH
10.0000 mL | INTRAVENOUS | Status: DC | PRN
  Administered 2016-03-21: 10 mL
  Filled 2016-03-21: qty 10

## 2016-03-21 MED ORDER — HEPARIN SOD (PORK) LOCK FLUSH 100 UNIT/ML IV SOLN
500.0000 [IU] | Freq: Once | INTRAVENOUS | Status: AC | PRN
  Administered 2016-03-21: 500 [IU]

## 2016-03-21 MED ORDER — DEXAMETHASONE SODIUM PHOSPHATE 100 MG/10ML IJ SOLN: 10.0000 mg | Freq: Once | INTRAMUSCULAR | Status: DC

## 2016-03-21 MED ORDER — DEXAMETHASONE SODIUM PHOSPHATE 10 MG/ML IJ SOLN
INTRAMUSCULAR | Status: AC
  Filled 2016-03-21: qty 1

## 2016-03-21 NOTE — Progress Notes (Signed)
Chemotherapy given today per orders. Patient tolerated it well without problems. Vitals stable, discharged home from clinic.follow up as scheduled.

## 2016-03-21 NOTE — Patient Instructions (Signed)
[facility] Discharge Instructions for Patients Receiving Chemotherapy   Beginning January 23rd 2017 lab work for the Cancer Center will be done in the  Main lab at [place] on 1st floor. If you have a lab appointment with the Cancer Center please come in thru the  Main Entrance and check in at the main information desk   Today you received the following chemotherapy agents   To help prevent nausea and vomiting after your treatment, we encourage you to take your nausea medication     If you develop nausea and vomiting, or diarrhea that is not controlled by your medication, call the clinic.  The clinic phone number is [phone]. Office hours are Monday-Friday 8:30am-5:00pm.  BELOW ARE SYMPTOMS THAT SHOULD BE REPORTED IMMEDIATELY:  *FEVER GREATER THAN 101.0 F  *CHILLS WITH OR WITHOUT FEVER  NAUSEA AND VOMITING THAT IS NOT CONTROLLED WITH YOUR NAUSEA MEDICATION  *UNUSUAL SHORTNESS OF BREATH  *UNUSUAL BRUISING OR BLEEDING  TENDERNESS IN MOUTH AND THROAT WITH OR WITHOUT PRESENCE OF ULCERS  *URINARY PROBLEMS  *BOWEL PROBLEMS  UNUSUAL RASH Items with * indicate a potential emergency and should be followed up as soon as possible. If you have an emergency after office hours please contact your primary care physician or go to the nearest emergency department.  Please call the clinic during office hours if you have any questions or concerns.   You may also contact the Patient Navigator at [phone] should you have any questions or need assistance in obtaining follow up care.      Resources For Cancer Patients and their Caregivers ? American Cancer Society: Can assist with transportation, wigs, general needs, runs Look Good Feel Better.        1-888-227-6333 ? Cancer Care: Provides financial assistance, online support groups, medication/co-pay assistance.  1-800-813-HOPE (4673) ? [facility] Assists [place] cancer  patients and their families through emotional , educational and financial support.  336-427-4357 ? [place] DSS Where to apply for food stamps, Medicaid and utility assistance. 336-342-1394 ? RCATS: Transportation to medical appointments. 336-347-2287 ? Social Security Administration: May apply for disability if have a Stage IV cancer. 336-342-7796 1-800-772-1213 ? [place] A[facility], Disability and Transit Services: Assists with nutrition, care and transit needs. 336-349-2343         

## 2016-03-22 LAB — CEA: CEA: 204.8 ng/mL — ABNORMAL HIGH (ref 0.0–4.7)

## 2016-03-28 ENCOUNTER — Encounter (HOSPITAL_BASED_OUTPATIENT_CLINIC_OR_DEPARTMENT_OTHER): Payer: Medicare Other

## 2016-03-28 ENCOUNTER — Encounter (HOSPITAL_COMMUNITY): Payer: Medicare Other

## 2016-03-28 ENCOUNTER — Encounter (HOSPITAL_COMMUNITY): Payer: Medicare Other | Attending: Oncology | Admitting: Oncology

## 2016-03-28 ENCOUNTER — Ambulatory Visit (HOSPITAL_COMMUNITY)
Admission: RE | Admit: 2016-03-28 | Discharge: 2016-03-28 | Disposition: A | Discharge status: Home / Self Care | Payer: Medicare Other | Source: Ambulatory Visit | Attending: Oncology | Admitting: Oncology

## 2016-03-28 ENCOUNTER — Encounter (HOSPITAL_COMMUNITY): Payer: Self-pay

## 2016-03-28 VITALS — BP 103/45 | HR 72 | Temp 99.5°F | Resp 20 | Wt 126.9 lb

## 2016-03-28 VITALS — BP 113/47 | HR 60 | Temp 98.0°F | Resp 20

## 2016-03-28 DIAGNOSIS — R911 Solitary pulmonary nodule: Secondary | ICD-10-CM | POA: Insufficient documentation

## 2016-03-28 DIAGNOSIS — I700 Atherosclerosis of aorta: Secondary | ICD-10-CM | POA: Insufficient documentation

## 2016-03-28 DIAGNOSIS — R079 Chest pain, unspecified: Secondary | ICD-10-CM

## 2016-03-28 DIAGNOSIS — R0602 Shortness of breath: Secondary | ICD-10-CM | POA: Diagnosis not present

## 2016-03-28 DIAGNOSIS — C189 Malignant neoplasm of colon, unspecified: Secondary | ICD-10-CM | POA: Diagnosis not present

## 2016-03-28 DIAGNOSIS — C787 Secondary malignant neoplasm of liver and intrahepatic bile duct: Secondary | ICD-10-CM

## 2016-03-28 DIAGNOSIS — Z5111 Encounter for antineoplastic chemotherapy: Secondary | ICD-10-CM | POA: Diagnosis not present

## 2016-03-28 DIAGNOSIS — D509 Iron deficiency anemia, unspecified: Secondary | ICD-10-CM | POA: Diagnosis not present

## 2016-03-28 DIAGNOSIS — D500 Iron deficiency anemia secondary to blood loss (chronic): Secondary | ICD-10-CM | POA: Diagnosis not present

## 2016-03-28 LAB — CBC WITH DIFFERENTIAL/PLATELET
BASOS ABS: 0 10*3/uL (ref 0.0–0.1)
BASOS PCT: 0 %
EOS ABS: 0.1 10*3/uL (ref 0.0–0.7)
EOS PCT: 2 %
HCT: 26.9 % — ABNORMAL LOW (ref 36.0–46.0)
Hemoglobin: 8.9 g/dL — ABNORMAL LOW (ref 12.0–15.0)
Lymphocytes Relative: 20 %
Lymphs Abs: 0.8 10*3/uL (ref 0.7–4.0)
MCH: 30 pg (ref 26.0–34.0)
MCHC: 33.1 g/dL (ref 30.0–36.0)
MCV: 90.6 fL (ref 78.0–100.0)
MONO ABS: 0.8 10*3/uL (ref 0.1–1.0)
Monocytes Relative: 20 %
Neutro Abs: 2.2 10*3/uL (ref 1.7–7.7)
Neutrophils Relative %: 58 %
PLATELETS: 190 10*3/uL (ref 150–400)
RBC: 2.97 MIL/uL — ABNORMAL LOW (ref 3.87–5.11)
RDW: 14.2 % (ref 11.5–15.5)
WBC: 3.8 10*3/uL — ABNORMAL LOW (ref 4.0–10.5)

## 2016-03-28 LAB — COMPREHENSIVE METABOLIC PANEL
ALT: 15 U/L (ref 14–54)
ANION GAP: 6 (ref 5–15)
AST: 19 U/L (ref 15–41)
Albumin: 3.2 g/dL — ABNORMAL LOW (ref 3.5–5.0)
Alkaline Phosphatase: 66 U/L (ref 38–126)
BILIRUBIN TOTAL: 0.5 mg/dL (ref 0.3–1.2)
BUN: 22 mg/dL — ABNORMAL HIGH (ref 6–20)
CO2: 23 mmol/L (ref 22–32)
CREATININE: 0.93 mg/dL (ref 0.44–1.00)
Calcium: 8.9 mg/dL (ref 8.9–10.3)
Chloride: 107 mmol/L (ref 101–111)
GFR, EST NON AFRICAN AMERICAN: 55 mL/min — AB (ref >60)
Glucose, Bld: 123 mg/dL — ABNORMAL HIGH (ref 65–99)
Potassium: 4.5 mmol/L (ref 3.5–5.1)
Sodium: 136 mmol/L (ref 135–145)
TOTAL PROTEIN: 6.6 g/dL (ref 6.5–8.1)

## 2016-03-28 LAB — MAGNESIUM: Magnesium: 1.7 mg/dL (ref 1.7–2.4)

## 2016-03-28 MED ORDER — IRINOTECAN HCL CHEMO INJECTION 100 MG/5ML
65.0000 mg/m2 | Freq: Once | INTRAVENOUS | Status: AC
  Administered 2016-03-28: 100 mg via INTRAVENOUS
  Filled 2016-03-28: qty 5

## 2016-03-28 MED ORDER — ATROPINE SULFATE 1 MG/ML IJ SOLN
0.5000 mg | Freq: Once | INTRAMUSCULAR | Status: AC | PRN
  Administered 2016-03-28: 0.5 mg via INTRAVENOUS

## 2016-03-28 MED ORDER — HEPARIN SOD (PORK) LOCK FLUSH 100 UNIT/ML IV SOLN
500.0000 [IU] | Freq: Once | INTRAVENOUS | Status: AC
  Administered 2016-03-28: 500 [IU] via INTRAVENOUS

## 2016-03-28 MED ORDER — DEXAMETHASONE SODIUM PHOSPHATE 10 MG/ML IJ SOLN
INTRAMUSCULAR | Status: AC
  Filled 2016-03-28: qty 1

## 2016-03-28 MED ORDER — PALONOSETRON HCL INJECTION 0.25 MG/5ML
INTRAVENOUS | Status: AC
  Filled 2016-03-28: qty 5

## 2016-03-28 MED ORDER — IOPAMIDOL (ISOVUE-370) INJECTION 76%
100.0000 mL | Freq: Once | INTRAVENOUS | Status: AC | PRN
  Administered 2016-03-28: 100 mL via INTRAVENOUS

## 2016-03-28 MED ORDER — ATROPINE SULFATE 1 MG/ML IJ SOLN
INTRAMUSCULAR | Status: AC
  Filled 2016-03-28: qty 1

## 2016-03-28 MED ORDER — DEXAMETHASONE SODIUM PHOSPHATE 10 MG/ML IJ SOLN
10.0000 mg | Freq: Once | INTRAMUSCULAR | Status: AC
  Administered 2016-03-28: 10 mg via INTRAVENOUS

## 2016-03-28 MED ORDER — PALONOSETRON HCL INJECTION 0.25 MG/5ML
0.2500 mg | Freq: Once | INTRAVENOUS | Status: AC
  Administered 2016-03-28: 0.25 mg via INTRAVENOUS

## 2016-03-28 MED ORDER — HEPARIN SOD (PORK) LOCK FLUSH 100 UNIT/ML IV SOLN
INTRAVENOUS | Status: AC
  Filled 2016-03-28: qty 5

## 2016-03-28 MED ORDER — SODIUM CHLORIDE 0.9 % IV SOLN
INTRAVENOUS | Status: DC
  Administered 2016-03-28: 13:00:00 via INTRAVENOUS

## 2016-03-28 NOTE — Progress Notes (Signed)
[name] at [place], MD [place] / [place] [address]   DIAGNOSIS: Colon carcinoma with permanent ostomy Diversion Colitis Achalasia Iron deficiency, anemia secondary to GI related blood loss Hemeoccult positive stools Rising CEA with CT imaging on 07/13/2014 1.3 cm mass in L hepatic lobe Subcapsular mass in anterior pole of L kidney suspicious for RCC Cutaneous microwave ablation of her now biopsy-proven metastatic colonic adenocarcinoma to the left liver 09/01/2014    Colon cancer metastasized to liver [facility])   04/12/1987 Definitive Surgery    Dr. Lindalou Hose at [facility]      09/01/2014 Pathology Results    Liver, needle/core biopsy - METASTATIC ADENOCARCINOMA.      09/01/2014 Miscellaneous    MWA left liver lesion, [name] (IR)      01/18/2015 Progression    PET scan      01/18/2015 PET scan    Recurrence of  tumor within segment 2 of the liver. There are 2 new lesions identified within segment 5 and segment 6 of the liver worrisome for additional sites of metastasis. Evidence of tumor recurrence at the intra colonic anastomosis is noted...      01/28/2015 - 04/16/2015 Chemotherapy    Xeloda 1500 mg BID 7 days on and 7 days off beginning in December 2016.      04/12/2015 Imaging    CT C/A/P progression of disease, enlargement of metastatic lesion in segments 2/3 of liver, development of mass with colon at junction of descending colon and sigmoid, indeterminate lesion in lower pole of the left kidney      04/20/2015 - 01/07/2016 Antibody Plan    Vectibix every 2 weeks, single-agent.      06/12/2015 - 06/13/2015 Hospital Admission    Hypotension, diarrhea, ARF, UTI      06/15/2015 Adverse Reaction    Vectibix rash      06/15/2015 Treatment Plan Change    Vectibix reduced to 5 mg/m2      07/13/2015 Treatment Plan Change    Treatment deferred due to hypomagnesemia and palpitations.  Escorted to ED.      09/24/2015 Imaging    CT abd/pelvis-  Liver lesions as follows:  Segment 2 left liver lobe hypodense 3.5 x 2.2 cm liver mass, previously 3.1 x 2.0 cm, mildly increased. Medial segment 6 right liver lobe 1.7 x 0.9 cm hypodense liver mass, new. Peripheral segment 6 right liver lobe 1.0 x 0.7 cm hypodense liver mass, new. Serosal 0.7 cm segment 5 right liver lobe mass, previously 0.6 cm, not appreciably changed. Hypodense 0.6 cm liver lesion adjacent to the IVC, previously 0.6 cm, unchanged.      09/24/2015 Imaging    CT chest- New 3 mm right upper lobe pulmonary nodule, indeterminate, pulmonary metastasis not excluded.      11/04/2015 Imaging    CT abd/pelvis- Mechanical small-bowel obstruction at the level of the mid small bowel, probably due to adhesions. No free air. No fluid collections. 2. Liver metastases appear stable. No new or progressive metastatic disease in the abdomen or pelvis.      01/09/2016 Imaging    CT CAP- Interval increase in size and prominence of RIGHT upper lobe pulmonary nodule measuring 4 mm is highly concerning for pulmonary metastasis. New enhancing lesion in the LEFT lateral hepatic lobe consistent with progression of hepatic metastasis. Stable mass along the descending colon was hypermetabolic on comparison PET-CT scan.  01/09/2016 Progression    CT imaging demonstrates new hepatic lobe lesion      02/08/2016 -  Chemotherapy    The patient had palonosetron (ALOXI) injection 0.25 mg, 0.25 mg, Intravenous,  Once, 1 of 4 cycles  irinotecan (CAMPTOSAR) 100 mg in dextrose 5 % 500 mL chemo infusion, 65 mg/m2 = 100 mg (100 % of original dose 65 mg/m2), Intravenous,  Once, 1 of 4 cycles Dose modification: 100 mg/m2 (original dose 65 mg/m2, Cycle 1, Reason: Provider Judgment), 75 mg/m2 (original dose 65 mg/m2, Cycle 1, Reason: Provider Judgment), 65 mg/m2 (original dose 65 mg/m2, Cycle 1, Reason: Provider Judgment), 75 mg/m2 (original dose 65 mg/m2, Cycle 1,  Reason: Provider Judgment)  for chemotherapy treatment.         History of Present Illness: Trissa returns today for further follow-up of stage IV CRC s/p microwave ablation of a solitary liver lesion. Unfortunately repeat imaging in December revealed other liver disease and recurrence at the prior anastomotic site.  She also has a L renal lesion that is under observation. She has other health issues including chronic LBP. She has progressed through XELODA and Vectibux. She will not consider hospice. She is on single agent irinotecan, dose reduced.   Patient returns today for follow up and cycle 2 of irinotecan. Patient denies diarrhea or shortness of breath. Reports lower chest discomfort for the past 4 days which feels muscular. She reports pain with breathing. She has chronic right hip pain. Her ostomy is well maintained.   MEDICAL HISTORY: Past Medical History:  Diagnosis Date  . Abdominal adhesions   . Achalasia    a. s/p botox injection for achalasia.  . Anemia   . Arthritis   . Asthma    hx of  . Blood transfusion   . Breast lump   . Cancer ([place])    bladder  . Chest pain    a. 2002 Cath: nl cors;  b. 2009 aden mv: nl;  c. 05/2009 Echo: nl. d. 2014: normal nuclear stress test, EF 69%.  . Chronic diastolic CHF (congestive heart failure) (HCC)    a. nl EF by echo 05/2009.  . CKD (chronic kidney disease), stage III    pt. states decreased kidney function  . Colon cancer ([place])    a. recurrence 2016 with liver mets -  percutaneous liver biopsy and thermal ablation of a liver metastasis by interventional radiology..  . Colon polyp   . Depression   . Diabetes mellitus without complication ([place]) 08/14/86   borderline type II; takes no medication for it  . DVT of deep femoral vein ([place])   . Family history of bladder cancer   . Family history of breast cancer   . Family history of colon cancer   . Fibromyalgia   . GERD (gastroesophageal reflux disease)   . Headache   . Hearing  loss   . Hernia   . History of blood clots   . History of PSVT (paroxysmal supraventricular tachycardia)   . Hyperlipidemia   . Hypertension   . Ileostomy present ([place])   . Iron deficiency anemia due to chronic blood loss 07/06/2013  . LBBB (left bundle branch block)   . Obstruction of intestine or colon 06/2014  . Sinus bradycardia    a. HR 30s in 08/2014 requiring holding of digoxin.  [place] Kitchen Thyroid disease   . UTI (urinary tract infection)   . Vision abnormalities 2016   not going blind but having retina problems; might lose ability  to see faces  . Wears dentures     has Left bundle branch block; Chest pain, exertional; Benign hypertensive heart disease without heart failure; Hypothyroid; Small bowel obstruction, partial ([place]); Dysphagia; Paroxysmal SVT (supraventricular tachycardia) ([place]); Ileostomy status ([place]); Unstable angina ([place]); Parastomal hernia; Iron deficiency anemia due to chronic blood loss; Malabsorption of iron; Acute abdominal pain; Acute renal failure ([place]); Hyperglycemia; UTI (urinary tract infection); Small bowel obstruction ([place]); Abdominal pain, acute; Liver mass, left lobe; Colon cancer metastasized to liver [facility]); Liver lesion; Sinus bradycardia; Family history of colon cancer; Family history of bladder cancer; Family history of breast cancer; Genetic testing; Counseling regarding end of life decision making; Neoplasm related pain; AKI (acute kidney injury) ([place]); Urinary tract infection; Hypotension; Diarrhea; Elevated troponin; CKD (chronic kidney disease) stage 3, GFR 30-59 ml/min; Hypomagnesemia; SBO (small bowel obstruction) (HCC); GERD (gastroesophageal reflux disease); and HTN (hypertension) on her problem list.     is allergic to bactrim [sulfamethoxazole-trimethoprim]; codeine; demerol; lidocaine hcl; morphine and related; dilaudid [hydromorphone hcl]; nitrofurantoin monohyd macro; lisinopril; and ramipril.   Iron Infusion in March 2016. Mammogram performed in 2015.  Reported multiple hernias.   SURGICAL HISTORY: Past Surgical History:  Procedure Laterality Date  . ABDOMINAL HYSTERECTOMY    . APPENDECTOMY    . BOTOX INJECTION N/A 09/01/2013   Procedure: BOTOX INJECTION;  Surgeon: Rogene Houston, MD;  Location: [facility];  Service: Endoscopy;  Laterality: N/A;  . BREAST SURGERY     right breast biopsy  . CARDIAC CATHETERIZATION  12/10/2000   THE LEFT VENTRICLE IS MILDY DILATED. THERE IS MILD TO MODERATE DIFFUSE HYPOKINESIS WITH EF 35%  . CATARACT EXTRACTION W/PHACO  11/13/2011   Procedure: CATARACT EXTRACTION PHACO AND INTRAOCULAR LENS PLACEMENT (IOC);  Surgeon: Tonny Branch, MD;  Location: [facility] ORS;  Service: Ophthalmology;  Laterality: Right;  CDE 12.26  . CATARACT EXTRACTION W/PHACO  12/08/2011   Procedure: CATARACT EXTRACTION PHACO AND INTRAOCULAR LENS PLACEMENT (IOC);  Surgeon: Tonny Branch, MD;  Location: [facility] ORS;  Service: Ophthalmology;  Laterality: Left;  CDE 15.11  . CHOLECYSTECTOMY    . COLON CANCER SURGERY    . COLON SURGERY    . COLONOSCOPY  09/26/2011   Procedure: COLONOSCOPY;  Surgeon: Rogene Houston, MD;  Location: [facility];  Service: Endoscopy;  Laterality: N/A;  215  . CORONARY ANGIOPLASTY    . ESOPHAGEAL DILATION N/A 01/18/2016   Procedure: ESOPHAGEAL DILATION;  Surgeon: Rogene Houston, MD;  Location: [facility];  Service: Endoscopy;  Laterality: N/A;  . ESOPHAGOGASTRODUODENOSCOPY  02/13/2011   Procedure: ESOPHAGOGASTRODUODENOSCOPY (EGD);  Surgeon: Rogene Houston, MD;  Location: [facility];  Service: Endoscopy;  Laterality: N/A;  1030  . ESOPHAGOGASTRODUODENOSCOPY N/A 09/01/2013   Procedure: ESOPHAGOGASTRODUODENOSCOPY (EGD);  Surgeon: Rogene Houston, MD;  Location: [facility];  Service: Endoscopy;  Laterality: N/A;  830  . ESOPHAGOGASTRODUODENOSCOPY N/A 01/18/2016   Procedure: ESOPHAGOGASTRODUODENOSCOPY (EGD);  Surgeon: Rogene Houston, MD;  Location: [facility];  Service: Endoscopy;  Laterality: N/A;  1120  . EYE  SURGERY  2014   catarac surgery  . ILEOSTOMY    . ILEOSTOMY    . JOINT REPLACEMENT Left 2008  . liver biopsy and ablation  09/01/14  . ovarian cystectomy 1955    . PORTACATH PLACEMENT Left 02/08/2016   Procedure: INSERTION PORT-A-CATH;  Surgeon: Aviva Signs, MD;  Location: [facility] ORS;  Service: General;  Laterality: Left;  . ROTATOR CUFF REPAIR    . TONSILLECTOMY  SOCIAL HISTORY: Social History   Social History  . Marital status: Married    Spouse name: N/A  . Number of children: 4  . Years of education: N/A   Social History Main Topics  . Smoking status: Former Smoker    Packs/day: 0.25    Years: 13.00    Types: Cigarettes    Quit date: 02/11/1963  . Smokeless tobacco: Never Used  . Alcohol use No  . Drug use: No  . Sexual activity: No   Other Topics Concern  . Not on file   Social History Narrative  . No narrative on file    Reads inspirational books Has 50 Great-Grandchildren and 1 Grandchildren  Father died 83 Mother died 49 Oldest brother died 91  FAMILY HISTORY: Family History  Problem Relation Age of Onset  . Heart disease Mother   . Stroke Mother     deceased  . Arthritis Mother   . Bladder Cancer Mother     dx in her 56s  . Colon cancer Mother   . Arthritis Father   . Heart disease Father     decesaed  . Leukemia Father   . Stroke Sister     alive/debilitated  . Hypertension Sister   . Other Sister     paralysis  . Heart disease Brother     bypass surgery  . Arthritis Brother   . Colon cancer Brother   . Bladder Cancer Brother   . Stroke Sister     alive/debilitated  . Diabetes Sister   . Other Brother     stomach problems  . Other Brother     bladder  . Colon cancer Paternal Aunt   . Bladder Cancer Other     dx twice in her 30s-40s  . Breast cancer Other     great niece dx in her early 55s  . Prostate cancer Other     newphew  . Colon polyps Daughter   . Stroke    . Hypertension      Review of Systems  Constitutional:  Negative.   HENT: Negative.   Eyes: Negative.   Respiratory: Negative.  Negative for shortness of breath.   Cardiovascular: Positive for chest pain.  Gastrointestinal: Negative for diarrhea.  Genitourinary: Negative.   Musculoskeletal: Positive for joint pain.  Skin: Negative.   Neurological: Negative.   Endo/Heme/Allergies: Negative.   Psychiatric/Behavioral: Negative.   All other systems reviewed and are negative. 14 point review of systems was performed and is negative except as detailed under history of present illness and above   PHYSICAL EXAMINATION Vitals with BMI 03/28/2016  Height   Weight 126 lbs 14 oz  BMI   Systolic 144  Diastolic 45  Pulse 72  Respirations 20   ECOG PERFORMANCE STATUS: 2 - Symptomatic, <50% confined to bed   Physical Exam  Constitutional: She is oriented to person, place, and time and well-developed, well-nourished, and in no distress.  In wheelchair  HENT:  Head: Normocephalic and atraumatic.  Eyes: EOM are normal. Pupils are equal, round, and reactive to light. No scleral icterus.  Neck: Normal range of motion. Neck supple.  Cardiovascular: Normal rate, regular rhythm and normal heart sounds.   Pulmonary/Chest: Effort normal and breath sounds normal.  Abdominal: Soft. Bowel sounds are normal. She exhibits no distension. There is no tenderness. There is no rebound.  RLQ ostomy bag with brown stool  Musculoskeletal: Normal range of motion.  Lymphadenopathy:    She has no cervical adenopathy.  Neurological: She is alert and oriented to person, place, and time. Gait normal.  Skin: Skin is warm and dry.  Nursing note and vitals reviewed.   LABORATORY DATA: I have reviewed the data as listed. Results for JAMESIA, LINNEN (MRN [id number]) as of 03/28/2016 09:45  Ref. Range 03/21/2016 11:29  COMPREHENSIVE METABOLIC PANEL Unknown Rpt (A)  Sodium Latest Ref Range: 135 - 145 mmol/L 143  Potassium Latest Ref Range: 3.5 - 5.1 mmol/L 4.2  Chloride  Latest Ref Range: 101 - 111 mmol/L 112 (H)  CO2 Latest Ref Range: 22 - 32 mmol/L 24  Glucose Latest Ref Range: 65 - 99 mg/dL 125 (H)  BUN Latest Ref Range: 6 - 20 mg/dL 15  Creatinine Latest Ref Range: 0.44 - 1.00 mg/dL 1.04 (H)  Calcium Latest Ref Range: 8.9 - 10.3 mg/dL 9.0  Anion gap Latest Ref Range: 5 - 15  7  Magnesium Latest Ref Range: 1.7 - 2.4 mg/dL 1.7  Alkaline Phosphatase Latest Ref Range: 38 - 126 U/L 67  Albumin Latest Ref Range: 3.5 - 5.0 g/dL 3.4 (L)  AST Latest Ref Range: 15 - 41 U/L 24  ALT Latest Ref Range: 14 - 54 U/L 13 (L)  Total Protein Latest Ref Range: 6.5 - 8.1 g/dL 6.4 (L)  Total Bilirubin Latest Ref Range: 0.3 - 1.2 mg/dL 0.5  EGFR (African American) Latest Ref Range: >60 mL/min 56 (L)  EGFR (Non-African Amer.) Latest Ref Range: >60 mL/min 48 (L)  WBC Latest Ref Range: 4.0 - 10.5 K/uL 3.3 (L)  RBC Latest Ref Range: 3.87 - 5.11 MIL/uL 3.27 (L)  Hemoglobin Latest Ref Range: 12.0 - 15.0 g/dL 9.7 (L)  HCT Latest Ref Range: 36.0 - 46.0 % 29.5 (L)  MCV Latest Ref Range: 78.0 - 100.0 fL 90.2  MCH Latest Ref Range: 26.0 - 34.0 pg 29.7  MCHC Latest Ref Range: 30.0 - 36.0 g/dL 32.9  RDW Latest Ref Range: 11.5 - 15.5 % 14.2  Platelets Latest Ref Range: 150 - 400 K/uL 166  Neutrophils Latest Units: % 64  Lymphocytes Latest Units: % 24  Monocytes Relative Latest Units: % 10  Eosinophil Latest Units: % 2  Basophil Latest Units: % 0  NEUT# Latest Ref Range: 1.7 - 7.7 K/uL 2.1  Lymphocyte # Latest Ref Range: 0.7 - 4.0 K/uL 0.8  Monocyte # Latest Ref Range: 0.1 - 1.0 K/uL 0.3  Eosinophils Absolute Latest Ref Range: 0.0 - 0.7 K/uL 0.1  Basophils Absolute Latest Ref Range: 0.0 - 0.1 K/uL 0.0  CEA Latest Ref Range: 0.0 - 4.7 ng/mL 204.8 (H)   Results for NEHAL, WITTING (MRN [id number]) as of 03/28/2016 09:45  Ref. Range 11/23/2015 11:02 12/07/2015 11:00 03/21/2016 11:29  CEA Latest Ref Range: 0.0 - 4.7 ng/mL 272.0 (H) 303.8 (H) 204.8 (H)    RADIOLOGY: I have reviewed  the images detailed below and agree with the results:  CT chest, abdomen, pelvis with contrast 01/09/2016 IMPRESSION: Chest Impression: 1. Interval increase in size and prominence of RIGHT upper lobe pulmonary nodule measuring 4 mm is highly concerning for pulmonary metastasis. 2. No mediastinal lymphadenopathy  Abdomen / Pelvis Impression: 1. New enhancing lesion in the LEFT lateral hepatic lobe consistent with progression of hepatic metastasis. 2. Stable mass along the descending colon was hypermetabolic on comparison PET-CT scan. 3. RIGHT lower quadrant ileostomy without complication.  ASSESSMENT and THERAPY PLAN:  Colon carcinoma with permanent ostomy Diversion Colitis Achalasia Iron deficiency, anemia secondary to GI related blood loss Hemeoccult positive  stools Rising CEA with CT imaging on 07/13/2014 1.3 cm mass in L hepatic lobe Subcapsular mass in anterior pole of L kidney suspicious for RCC Biopsy of L liver lesion and MWA Left liver lesion 09/01/2014 Taste Alteration KRAS WT XELODA therapy, week on, week off started 01/2015 Progressive disease Difficulty discussing end of life decision making [facility] admission for diarrhea ? Vectibix induced Hypomagnesemia secondary to Vectibix therapy  Patient is here for Day 8 Cycle 2 of dose reduced Irinotecan. She will continue with treatment today.  I have ordered a CT angiogram to be performed today to rule out pulmonary embolism for her SOB.   Patient is scheduled with nutrition on 03/28/2016.  Patient is scheduled for follow up with cardiologist, Dr. Domenic Polite on 05/19/2016.  Return to clinic on March 23rd for follow up with next cycle of treatment.   No orders of the defined types were placed in this encounter.  All questions were answered. The patient knows to call the clinic with any problems, questions or concerns. We can certainly see the patient much sooner if necessary.  This document serves as a  record of services personally performed by Twana First, MD. It was created on her behalf by Arlyce Harman, a trained medical scribe. The creation of this record is based on the scribe's personal observations and the provider's statements to them. This document has been checked and approved by the attending provider.  I have reviewed the above documentation for accuracy and completeness, and I agree with the above.  This document was electronically signed.   Carlis Abbott

## 2016-03-28 NOTE — Patient Instructions (Signed)
[facility] Discharge Instructions for Patients Receiving Chemotherapy   Beginning January 23rd 2017 lab work for the Cancer Center will be done in the  Main lab at [place] on 1st floor. If you have a lab appointment with the Cancer Center please come in thru the  Main Entrance and check in at the main information desk   Today you received the following chemotherapy agent: Irinotecan.     If you develop nausea and vomiting, or diarrhea that is not controlled by your medication, call the clinic.  The clinic phone number is [phone]. Office hours are Monday-Friday 8:30am-5:00pm.  BELOW ARE SYMPTOMS THAT SHOULD BE REPORTED IMMEDIATELY:  *FEVER GREATER THAN 101.0 F  *CHILLS WITH OR WITHOUT FEVER  NAUSEA AND VOMITING THAT IS NOT CONTROLLED WITH YOUR NAUSEA MEDICATION  *UNUSUAL SHORTNESS OF BREATH  *UNUSUAL BRUISING OR BLEEDING  TENDERNESS IN MOUTH AND THROAT WITH OR WITHOUT PRESENCE OF ULCERS  *URINARY PROBLEMS  *BOWEL PROBLEMS  UNUSUAL RASH Items with * indicate a potential emergency and should be followed up as soon as possible. If you have an emergency after office hours please contact your primary care physician or go to the nearest emergency department.  Please call the clinic during office hours if you have any questions or concerns.   You may also contact the Patient Navigator at [phone] should you have any questions or need assistance in obtaining follow up care.      Resources For Cancer Patients and their Caregivers ? American Cancer Society: Can assist with transportation, wigs, general needs, runs Look Good Feel Better.        1-888-227-6333 ? Cancer Care: Provides financial assistance, online support groups, medication/co-pay assistance.  1-800-813-HOPE (4673) ? [facility] Assists [place] cancer patients and their families through emotional , educational and financial support.   336-427-4357 ? [place] DSS Where to apply for food stamps, Medicaid and utility assistance. 336-342-1394 ? RCATS: Transportation to medical appointments. 336-347-2287 ? Social Security Administration: May apply for disability if have a Stage IV cancer. 336-342-7796 1-800-772-1213 ? [place] Aging, Disability and Transit Services: Assists with nutrition, care and transit needs. 336-349-2343          

## 2016-03-28 NOTE — Patient Instructions (Signed)
[place] at [facility] Discharge Instructions  RECOMMENDATIONS MADE BY THE CONSULTANT AND ANY TEST RESULTS WILL BE SENT TO YOUR REFERRING PHYSICIAN.  You were seen today by Dr. Barron Schmid We will do a STAT CT chest today, we will call you with the results Follow up with Tom on your next treatment cycle 3/23 See [name] up front for appointments   Thank you for choosing [place] at [facility] to provide your oncology and hematology care.  To afford each patient quality time with our provider, please arrive at least 15 minutes before your scheduled appointment time.    If you have a lab appointment with the [place] please come in thru the  Main Entrance and check in at the main information desk  You need to re-schedule your appointment should you arrive 10 or more minutes late.  We strive to give you quality time with our providers, and arriving late affects you and other patients whose appointments are after yours.  Also, if you no show three or more times for appointments you may be dismissed from the clinic at the providers discretion.     Again, thank you for choosing [facility].  Our hope is that these requests will decrease the amount of time that you wait before being seen by our physicians.       _____________________________________________________________  Should you have questions after your visit to [facility], please contact our office at [phone] between the hours of 8:30 a.m. and 4:30 p.m.  Voicemails left after 4:30 p.m. will not be returned until the following business day.  For prescription refill requests, have your pharmacy contact our office.       Resources For Cancer Patients and their Caregivers ? American Cancer Society: Can assist with transportation, wigs, general needs, runs Look Good Feel Better.        (614) 643-0661 ? Cancer Care: Provides financial assistance, online  support groups, medication/co-pay assistance.  1-800-813-HOPE 870-462-7205) ? [place] Assists [place] cancer patients and their families through emotional , educational and financial support.  778 837 6858 ? [place] DSS Where to apply for food stamps, Medicaid and utility assistance. 719-109-1933 ? RCATS: Transportation to medical appointments. (406) 689-7234 ? Social Security Administration: May apply for disability if have a Stage IV cancer. ([phone] 812-848-7953 ? [employer], Disability and Transit Services: Assists with nutrition, care and transit needs. [place] Support Programs: @10RELATIVEDAYS @ > Cancer Support Group  2nd Tuesday of the month 1pm-2pm, [name]  > Creative Journey  3rd Tuesday of the month 1130am-1pm, [name]  > Look Good Feel Better  1st Wednesday of the month 10am-12 noon, [name] (Call [place] to register [phone])

## 2016-03-28 NOTE — Progress Notes (Signed)
Nutrition Follow-up:  Patient seen in infusion this pm.    Patient reports "I just don't have much of an appetite."  I cook for my husband but I just don't want anything to eat."  Reports she drank some ensure but made her nauseated so hasn't drank anymore.  Asked if she takes nausea medication and she said well I don't because I don't have much nausea.    Medications: reviewed  Labs: reviewed  Anthropometrics:   Patient weight has decreased to 126 pounds 14.4 oz today from 139 lbs on 1/5 9% weight loss in 1 1/2 months  NUTRITION DIAGNOSIS: Inadequate oral intake continues   MALNUTRITION DIAGNOSIS: Patient meets criteria for severe malnutrition in the context of acute illness but likely progressing to chronic illness as evidenced by 9% weight loss in 1 1/2 months and intake < or equal to 50% of estimated energy needs for > or equal to 5 months   INTERVENTION:   Discussed importance of increasing calories and protein. Handout given to patient. Daughter at chairside. Encouraged intake of ensure plus Patient may benefit from appetite stimulant    MONITORING, EVALUATION, GOAL: patient will consume adequate calories and protein to prevent weight loss.   NEXT VISIT: March 23rd   Karen [name] B. Zenia Resides, [place], [place] (pager)

## 2016-03-28 NOTE — Progress Notes (Signed)
Patient tolerated infusion well.  VSS.  Patient stable and wheeled out of clinic in wheelchair by family per patient preference.  Patient given information that CT angio for PE was negative.

## 2016-03-31 ENCOUNTER — Other Ambulatory Visit (HOSPITAL_COMMUNITY): Payer: Medicare Other

## 2016-04-04 ENCOUNTER — Encounter (HOSPITAL_BASED_OUTPATIENT_CLINIC_OR_DEPARTMENT_OTHER): Payer: Medicare Other

## 2016-04-04 VITALS — BP 111/31 | HR 67 | Temp 98.3°F | Resp 18 | Wt 126.6 lb

## 2016-04-04 DIAGNOSIS — C189 Malignant neoplasm of colon, unspecified: Secondary | ICD-10-CM

## 2016-04-04 DIAGNOSIS — C787 Secondary malignant neoplasm of liver and intrahepatic bile duct: Secondary | ICD-10-CM

## 2016-04-04 DIAGNOSIS — Z5111 Encounter for antineoplastic chemotherapy: Secondary | ICD-10-CM

## 2016-04-04 DIAGNOSIS — D509 Iron deficiency anemia, unspecified: Secondary | ICD-10-CM | POA: Diagnosis not present

## 2016-04-04 LAB — CBC WITH DIFFERENTIAL/PLATELET
Basophils Absolute: 0 10*3/uL (ref 0.0–0.1)
Basophils Relative: 0 %
EOS ABS: 0 10*3/uL (ref 0.0–0.7)
EOS PCT: 2 %
HCT: 28 % — ABNORMAL LOW (ref 36.0–46.0)
Hemoglobin: 9 g/dL — ABNORMAL LOW (ref 12.0–15.0)
LYMPHS ABS: 0.6 10*3/uL — AB (ref 0.7–4.0)
Lymphocytes Relative: 23 %
MCH: 28.8 pg (ref 26.0–34.0)
MCHC: 32.1 g/dL (ref 30.0–36.0)
MCV: 89.7 fL (ref 78.0–100.0)
MONO ABS: 0.4 10*3/uL (ref 0.1–1.0)
MONOS PCT: 17 %
Neutro Abs: 1.5 10*3/uL — ABNORMAL LOW (ref 1.7–7.7)
Neutrophils Relative %: 58 %
PLATELETS: 222 10*3/uL (ref 150–400)
RBC: 3.12 MIL/uL — ABNORMAL LOW (ref 3.87–5.11)
RDW: 13.9 % (ref 11.5–15.5)
WBC: 2.6 10*3/uL — AB (ref 4.0–10.5)

## 2016-04-04 LAB — COMPREHENSIVE METABOLIC PANEL
ALT: 12 U/L — ABNORMAL LOW (ref 14–54)
ANION GAP: 6 (ref 5–15)
AST: 18 U/L (ref 15–41)
Albumin: 3.2 g/dL — ABNORMAL LOW (ref 3.5–5.0)
Alkaline Phosphatase: 70 U/L (ref 38–126)
BUN: 22 mg/dL — ABNORMAL HIGH (ref 6–20)
CALCIUM: 9 mg/dL (ref 8.9–10.3)
CHLORIDE: 108 mmol/L (ref 101–111)
CO2: 22 mmol/L (ref 22–32)
Creatinine, Ser: 0.98 mg/dL (ref 0.44–1.00)
GFR calc non Af Amer: 52 mL/min — ABNORMAL LOW (ref >60)
GFR, EST AFRICAN AMERICAN: 60 mL/min — AB (ref >60)
Glucose, Bld: 122 mg/dL — ABNORMAL HIGH (ref 65–99)
Potassium: 4.2 mmol/L (ref 3.5–5.1)
SODIUM: 136 mmol/L (ref 135–145)
Total Bilirubin: 0.4 mg/dL (ref 0.3–1.2)
Total Protein: 6.2 g/dL — ABNORMAL LOW (ref 6.5–8.1)

## 2016-04-04 LAB — MAGNESIUM: MAGNESIUM: 1.7 mg/dL (ref 1.7–2.4)

## 2016-04-04 MED ORDER — FENTANYL 25 MCG/HR TD PT72: 25.0000 ug | MEDICATED_PATCH | TRANSDERMAL | 0 refills | Status: DC

## 2016-04-04 MED ORDER — SODIUM CHLORIDE 0.9% FLUSH
10.0000 mL | INTRAVENOUS | Status: DC | PRN
  Administered 2016-04-04: 10 mL via INTRAVENOUS
  Filled 2016-04-04: qty 10

## 2016-04-04 MED ORDER — HYDROCODONE-ACETAMINOPHEN 7.5-325 MG PO TABS: 1.0000 | ORAL_TABLET | ORAL | 0 refills | Status: DC | PRN

## 2016-04-04 MED ORDER — ATROPINE SULFATE 1 MG/ML IJ SOLN
0.5000 mg | Freq: Once | INTRAMUSCULAR | Status: AC | PRN
Start: 2016-04-04 — End: 2016-04-04
  Administered 2016-04-04: 0.5 mg via INTRAVENOUS
  Filled 2016-04-04: qty 1

## 2016-04-04 MED ORDER — PALONOSETRON HCL INJECTION 0.25 MG/5ML
0.2500 mg | Freq: Once | INTRAVENOUS | Status: AC
  Administered 2016-04-04: 0.25 mg via INTRAVENOUS
  Filled 2016-04-04: qty 5

## 2016-04-04 MED ORDER — DEXTROSE 5 % IV SOLN
65.0000 mg/m2 | Freq: Once | INTRAVENOUS | Status: AC
  Administered 2016-04-04: 100 mg via INTRAVENOUS
  Filled 2016-04-04: qty 5

## 2016-04-04 MED ORDER — DEXAMETHASONE SODIUM PHOSPHATE 10 MG/ML IJ SOLN
10.0000 mg | Freq: Once | INTRAMUSCULAR | Status: AC
  Administered 2016-04-04: 10 mg via INTRAVENOUS
  Filled 2016-04-04: qty 1

## 2016-04-04 MED ORDER — SODIUM CHLORIDE 0.9 % IV SOLN
INTRAVENOUS | Status: DC
  Administered 2016-04-04: 13:00:00 via INTRAVENOUS

## 2016-04-04 MED ORDER — HEPARIN SOD (PORK) LOCK FLUSH 100 UNIT/ML IV SOLN
500.0000 [IU] | Freq: Once | INTRAVENOUS | Status: AC
  Administered 2016-04-04: 500 [IU] via INTRAVENOUS

## 2016-04-04 NOTE — Progress Notes (Signed)
Karen [name] tolerated chemo tx well without complaints or incident. Labs and pt's increased fatigue after last tx reviewed with Dr. Talbert Cage who approved chemo for today if pt felt strong enough. Pt agreed to tx today. VSS upon discharge. Pt discharged via wheelchair in satisfactory condition accompanied by her daughter

## 2016-04-04 NOTE — Patient Instructions (Signed)
[facility] Discharge Instructions for Patients Receiving Chemotherapy   Beginning January 23rd 2017 lab work for the Cancer Center will be done in the  Main lab at  on 1st floor. If you have a lab appointment with the Cancer Center please come in thru the  Main Entrance and check in at the main information desk   Today you received the following chemotherapy agents Irinotecan. Follow-up as scheduled. Call clinic for any questions or concerns  To help prevent nausea and vomiting after your treatment, we encourage you to take your nausea medication   If you develop nausea and vomiting, or diarrhea that is not controlled by your medication, call the clinic.  The clinic phone number is [phone]. Office hours are Monday-Friday 8:30am-5:00pm.  BELOW ARE SYMPTOMS THAT SHOULD BE REPORTED IMMEDIATELY:  *FEVER GREATER THAN 101.0 F  *CHILLS WITH OR WITHOUT FEVER  NAUSEA AND VOMITING THAT IS NOT CONTROLLED WITH YOUR NAUSEA MEDICATION  *UNUSUAL SHORTNESS OF BREATH  *UNUSUAL BRUISING OR BLEEDING  TENDERNESS IN MOUTH AND THROAT WITH OR WITHOUT PRESENCE OF ULCERS  *URINARY PROBLEMS  *BOWEL PROBLEMS  UNUSUAL RASH Items with * indicate a potential emergency and should be followed up as soon as possible. If you have an emergency after office hours please contact your primary care physician or go to the nearest emergency department.  Please call the clinic during office hours if you have any questions or concerns.   You may also contact the Patient Navigator at [phone] should you have any questions or need assistance in obtaining follow up care.      Resources For Cancer Patients and their Caregivers ? American Cancer Society: Can assist with transportation, wigs, general needs, runs Look Good Feel Better.        1-888-227-6333 ? Cancer Care: Provides financial assistance, online support groups, medication/co-pay assistance.  1-800-813-HOPE  (4673) ? [facility] Assists [place] cancer patients and their families through emotional , educational and financial support.  336-427-4357 ? [place] DSS Where to apply for food stamps, Medicaid and utility assistance. 336-342-1394 ? RCATS: Transportation to medical appointments. 336-347-2287 ? Social Security Administration: May apply for disability if have a Stage IV cancer. 336-342-7796 1-800-772-1213 ? [place] Aging, Disability and Transit Services: Assists with nutrition, care and transit needs. 336-349-2343         

## 2016-04-07 ENCOUNTER — Telehealth (INDEPENDENT_AMBULATORY_CARE_PROVIDER_SITE_OTHER): Payer: Self-pay | Admitting: Internal Medicine

## 2016-04-07 ENCOUNTER — Telehealth (HOSPITAL_COMMUNITY): Payer: Self-pay | Admitting: *Deleted

## 2016-04-07 NOTE — Telephone Encounter (Signed)
Patient called, stated that she is having trouble swallowing and is losing weight.  She wants to know if there is something Dr. Laural Golden can give her to help.  [phone]

## 2016-04-07 NOTE — Telephone Encounter (Signed)
Should she continue, if so I will reorder if okay.  MB

## 2016-04-08 NOTE — Telephone Encounter (Signed)
Notified patient she could stop taking the magnesium. She verbalized understanding.

## 2016-04-08 NOTE — Telephone Encounter (Signed)
She can stop it.  Robynn Pane, PA-C 04/08/2016 3:54 PM

## 2016-04-09 NOTE — Telephone Encounter (Signed)
Patient states that she has been really weak. She is having abdominal pain , due to the new chemo she has started. Levsin does not help. She is having a lot of diarrhea, her bag is filling up quickly. She had been advised by Dr.penland not to take no more than 2 Lomotil per day. She didn't but later had a blockage and was advised not to take the Lomotil anymore. Her weight had been 125 lbs in the morning after a shower, week before last at hospital she weight 122 lbs. She tells me that she weighed recently and could not see scales and was told by someone that is was 112 lbs. Ms. [place] says that when she tries to eat and the drinks something,water/ters she feels a knot in her throat. She ask if there is something else that can be done.

## 2016-04-11 ENCOUNTER — Encounter (HOSPITAL_COMMUNITY): Payer: Self-pay

## 2016-04-11 ENCOUNTER — Other Ambulatory Visit (HOSPITAL_COMMUNITY): Payer: Self-pay | Admitting: Oncology

## 2016-04-11 ENCOUNTER — Encounter (HOSPITAL_COMMUNITY): Payer: Medicare Other | Attending: Hematology and Oncology

## 2016-04-11 VITALS — BP 109/41 | HR 70 | Temp 98.2°F | Resp 18 | Wt 120.4 lb

## 2016-04-11 DIAGNOSIS — R197 Diarrhea, unspecified: Secondary | ICD-10-CM | POA: Diagnosis not present

## 2016-04-11 DIAGNOSIS — D509 Iron deficiency anemia, unspecified: Secondary | ICD-10-CM | POA: Insufficient documentation

## 2016-04-11 DIAGNOSIS — T451X5A Adverse effect of antineoplastic and immunosuppressive drugs, initial encounter: Secondary | ICD-10-CM

## 2016-04-11 DIAGNOSIS — C787 Secondary malignant neoplasm of liver and intrahepatic bile duct: Secondary | ICD-10-CM | POA: Diagnosis not present

## 2016-04-11 DIAGNOSIS — R109 Unspecified abdominal pain: Secondary | ICD-10-CM | POA: Diagnosis present

## 2016-04-11 DIAGNOSIS — R790 Abnormal level of blood mineral: Secondary | ICD-10-CM

## 2016-04-11 DIAGNOSIS — C189 Malignant neoplasm of colon, unspecified: Secondary | ICD-10-CM | POA: Diagnosis not present

## 2016-04-11 DIAGNOSIS — R112 Nausea with vomiting, unspecified: Secondary | ICD-10-CM

## 2016-04-11 LAB — CBC WITH DIFFERENTIAL/PLATELET
BASOS PCT: 0 %
Basophils Absolute: 0 10*3/uL (ref 0.0–0.1)
EOS ABS: 0 10*3/uL (ref 0.0–0.7)
Eosinophils Relative: 1 %
HEMATOCRIT: 28.7 % — AB (ref 36.0–46.0)
HEMOGLOBIN: 9.5 g/dL — AB (ref 12.0–15.0)
LYMPHS ABS: 0.5 10*3/uL — AB (ref 0.7–4.0)
Lymphocytes Relative: 19 %
MCH: 29.6 pg (ref 26.0–34.0)
MCHC: 33.1 g/dL (ref 30.0–36.0)
MCV: 89.4 fL (ref 78.0–100.0)
MONO ABS: 0.5 10*3/uL (ref 0.1–1.0)
MONOS PCT: 16 %
NEUTROS ABS: 1.8 10*3/uL (ref 1.7–7.7)
NEUTROS PCT: 64 %
Platelets: 221 10*3/uL (ref 150–400)
RBC: 3.21 MIL/uL — ABNORMAL LOW (ref 3.87–5.11)
RDW: 14.6 % (ref 11.5–15.5)
WBC: 2.8 10*3/uL — ABNORMAL LOW (ref 4.0–10.5)

## 2016-04-11 LAB — COMPREHENSIVE METABOLIC PANEL
ALK PHOS: 67 U/L (ref 38–126)
ALT: 12 U/L — ABNORMAL LOW (ref 14–54)
ANION GAP: 9 (ref 5–15)
AST: 19 U/L (ref 15–41)
Albumin: 3.4 g/dL — ABNORMAL LOW (ref 3.5–5.0)
BILIRUBIN TOTAL: 0.3 mg/dL (ref 0.3–1.2)
BUN: 22 mg/dL — ABNORMAL HIGH (ref 6–20)
CALCIUM: 9.1 mg/dL (ref 8.9–10.3)
CO2: 21 mmol/L — ABNORMAL LOW (ref 22–32)
Chloride: 106 mmol/L (ref 101–111)
Creatinine, Ser: 1.11 mg/dL — ABNORMAL HIGH (ref 0.44–1.00)
GFR calc non Af Amer: 44 mL/min — ABNORMAL LOW (ref >60)
GFR, EST AFRICAN AMERICAN: 51 mL/min — AB (ref >60)
Glucose, Bld: 137 mg/dL — ABNORMAL HIGH (ref 65–99)
POTASSIUM: 4.2 mmol/L (ref 3.5–5.1)
Sodium: 136 mmol/L (ref 135–145)
TOTAL PROTEIN: 6.8 g/dL (ref 6.5–8.1)

## 2016-04-11 LAB — CLOSTRIDIUM DIFFICILE BY PCR: CDIFFPCR: NEGATIVE

## 2016-04-11 LAB — MAGNESIUM: MAGNESIUM: 1.6 mg/dL — AB (ref 1.7–2.4)

## 2016-04-11 MED ORDER — ONDANSETRON 8 MG PO TBDP: 8.0000 mg | ORAL_TABLET | Freq: Three times a day (TID) | ORAL | 3 refills | Status: AC | PRN

## 2016-04-11 MED ORDER — DICYCLOMINE HCL 10 MG PO CAPS: 10.0000 mg | ORAL_CAPSULE | Freq: Two times a day (BID) | ORAL | 1 refills | Status: DC | PRN

## 2016-04-11 MED ORDER — HEPARIN SOD (PORK) LOCK FLUSH 100 UNIT/ML IV SOLN
500.0000 [IU] | Freq: Once | INTRAVENOUS | Status: AC
  Administered 2016-04-11: 500 [IU] via INTRAVENOUS

## 2016-04-11 MED ORDER — LIDOCAINE-PRILOCAINE 2.5-2.5 % EX CREA: TOPICAL_CREAM | CUTANEOUS | 2 refills | Status: AC

## 2016-04-11 MED ORDER — SODIUM CHLORIDE 0.9 % IV SOLN
INTRAVENOUS | Status: DC
  Administered 2016-04-11: 11:00:00 via INTRAVENOUS

## 2016-04-11 MED ORDER — SODIUM CHLORIDE 0.9 % IV SOLN
2.0000 g | Freq: Once | INTRAVENOUS | Status: AC
  Administered 2016-04-11: 2 g via INTRAVENOUS
  Filled 2016-04-11: qty 4

## 2016-04-11 MED ORDER — ALUM & MAG HYDROXIDE-SIMETH 200-200-20 MG/5ML PO SUSP
30.0000 mL | ORAL | Status: AC
  Administered 2016-04-11: 30 mL via ORAL
  Filled 2016-04-11: qty 30

## 2016-04-11 MED ORDER — HYDROCODONE-ACETAMINOPHEN 5-325 MG PO TABS
ORAL_TABLET | ORAL | Status: AC
  Filled 2016-04-11: qty 1

## 2016-04-11 MED ORDER — HEPARIN SOD (PORK) LOCK FLUSH 100 UNIT/ML IV SOLN
INTRAVENOUS | Status: AC
  Filled 2016-04-11: qty 5

## 2016-04-11 MED ORDER — MAGNESIUM OXIDE 400 (241.3 MG) MG PO TABS: 1.0000 | ORAL_TABLET | Freq: Three times a day (TID) | ORAL | 1 refills | Status: AC

## 2016-04-11 MED ORDER — HYDROCODONE-ACETAMINOPHEN 5-325 MG PO TABS
1.0000 | ORAL_TABLET | ORAL | Status: AC
  Administered 2016-04-11: 1 via ORAL

## 2016-04-11 NOTE — Progress Notes (Unsigned)
Bentyl

## 2016-04-11 NOTE — Progress Notes (Signed)
Patient tolerated all infusions well today.  VSS.  Patient reported that Maalox dose wasn't helpful with abdominal pain, Gari Crown, NP made aware and orders obtained for medication at home.  Patient and daughter understand instructions.  Stable and wheeled out via wheelchair by daughter.

## 2016-04-11 NOTE — Patient Instructions (Signed)
[place] [name] at [facility] Discharge Instructions  RECOMMENDATIONS MADE BY THE CONSULTANT AND ANY TEST RESULTS WILL BE SENT TO YOUR REFERRING PHYSICIAN.  IV fluids.   Thank you for choosing [place] at [facility] to provide your oncology and hematology care.  To afford each patient quality time with our provider, please arrive at least 15 minutes before your scheduled appointment time.    If you have a lab appointment with the [place] please come in thru the  Main Entrance and check in at the main information desk  You need to re-schedule your appointment should you arrive 10 or more minutes late.  We strive to give you quality time with our providers, and arriving late affects you and other patients whose appointments are after yours.  Also, if you no show three or more times for appointments you may be dismissed from the clinic at the providers discretion.     Again, thank you for choosing [facility].  Our hope is that these requests will decrease the amount of time that you wait before being seen by our physicians.       _____________________________________________________________  Should you have questions after your visit to [facility], please contact our office at [phone] between the hours of 8:30 a.m. and 4:30 p.m.  Voicemails left after 4:30 p.m. will not be returned until the following business day.  For prescription refill requests, have your pharmacy contact our office.       Resources For Cancer Patients and their Caregivers ? American Cancer Society: Can assist with transportation, wigs, general needs, runs Look Good Feel Better.        217-125-4031 ? Cancer Care: Provides financial assistance, online support groups, medication/co-pay assistance.  1-800-813-HOPE (240)107-4662) ? [place] Assists [place] cancer patients and their families through emotional ,  educational and financial support.  323-702-9572 ? [place] DSS Where to apply for food stamps, Medicaid and utility assistance. (785)671-8499 ? [employer]: Transportation to medical appointments. (432)171-9725 ? Social Security Administration: May apply for disability if have a Stage IV cancer. 8654180791 603-835-5224 ? [employer], Disability and Transit Services: Assists with nutrition, care and transit needs. [place] Support Programs: @10RELATIVEDAYS @ > Cancer Support Group  2nd Tuesday of the month 1pm-2pm, [name]  > Creative Journey  3rd Tuesday of the month 1130am-1pm, [name]  > Look Good Feel Better  1st Wednesday of the month 10am-12 noon, [name] (Call [place] to register [phone])

## 2016-04-11 NOTE — Progress Notes (Signed)
Reports generalized weakness, hourly diarrhea, abdominal cramping, decreased appetite, nausea, decreased po fluid intake x 1 week.  6 lb weight loss in 1 week.  Pt does not want tx today, but is requesting IVF.  Discussed with Fortunato Curling, NP - lab work and IVF ordered.   Requesting pain medication for abdominal cramping.  Fortunato Curling, NP notified and orders rec'd.

## 2016-04-12 LAB — CEA: CEA: 185 ng/mL — ABNORMAL HIGH (ref 0.0–4.7)

## 2016-04-18 ENCOUNTER — Ambulatory Visit (HOSPITAL_COMMUNITY): Payer: Medicare Other

## 2016-04-19 NOTE — Telephone Encounter (Signed)
After multiple attempts I was able to talk with her daughter Katharine Look on 04/16/2016 Botox therapy has not provided relief therefore repeat therapy not recommended. Only options are NG feeding or gastric tube. If she becomes dehydrated and and up in the hospital can review all options. & Has office visit with oncologist next week.

## 2016-05-02 ENCOUNTER — Encounter (HOSPITAL_COMMUNITY): Payer: Self-pay

## 2016-05-02 ENCOUNTER — Encounter (HOSPITAL_COMMUNITY): Payer: Medicare Other

## 2016-05-02 ENCOUNTER — Encounter (HOSPITAL_COMMUNITY): Payer: Self-pay | Admitting: Oncology

## 2016-05-02 ENCOUNTER — Encounter (HOSPITAL_BASED_OUTPATIENT_CLINIC_OR_DEPARTMENT_OTHER): Payer: Medicare Other | Admitting: Oncology

## 2016-05-02 ENCOUNTER — Encounter (HOSPITAL_BASED_OUTPATIENT_CLINIC_OR_DEPARTMENT_OTHER): Payer: Medicare Other

## 2016-05-02 VITALS — BP 102/49 | HR 63 | Temp 98.1°F | Resp 18

## 2016-05-02 VITALS — BP 122/56 | HR 69 | Temp 99.1°F | Resp 16 | Wt 121.7 lb

## 2016-05-02 DIAGNOSIS — C189 Malignant neoplasm of colon, unspecified: Secondary | ICD-10-CM

## 2016-05-02 DIAGNOSIS — Z5111 Encounter for antineoplastic chemotherapy: Secondary | ICD-10-CM

## 2016-05-02 DIAGNOSIS — R634 Abnormal weight loss: Secondary | ICD-10-CM | POA: Diagnosis not present

## 2016-05-02 DIAGNOSIS — C787 Secondary malignant neoplasm of liver and intrahepatic bile duct: Secondary | ICD-10-CM | POA: Diagnosis not present

## 2016-05-02 DIAGNOSIS — D509 Iron deficiency anemia, unspecified: Secondary | ICD-10-CM | POA: Diagnosis not present

## 2016-05-02 LAB — CBC WITH DIFFERENTIAL/PLATELET
BASOS ABS: 0 10*3/uL (ref 0.0–0.1)
Basophils Relative: 0 %
EOS PCT: 2 %
Eosinophils Absolute: 0.1 10*3/uL (ref 0.0–0.7)
HEMATOCRIT: 29.4 % — AB (ref 36.0–46.0)
Hemoglobin: 9.4 g/dL — ABNORMAL LOW (ref 12.0–15.0)
LYMPHS PCT: 21 %
Lymphs Abs: 1.3 10*3/uL (ref 0.7–4.0)
MCH: 28.9 pg (ref 26.0–34.0)
MCHC: 32 g/dL (ref 30.0–36.0)
MCV: 90.5 fL (ref 78.0–100.0)
MONOS PCT: 16 %
Monocytes Absolute: 1 10*3/uL (ref 0.1–1.0)
NEUTROS ABS: 3.6 10*3/uL (ref 1.7–7.7)
Neutrophils Relative %: 61 %
PLATELETS: 210 10*3/uL (ref 150–400)
RBC: 3.25 MIL/uL — ABNORMAL LOW (ref 3.87–5.11)
RDW: 15.5 % (ref 11.5–15.5)
WBC: 6 10*3/uL (ref 4.0–10.5)

## 2016-05-02 LAB — COMPREHENSIVE METABOLIC PANEL
ALT: 16 U/L (ref 14–54)
ANION GAP: 8 (ref 5–15)
AST: 34 U/L (ref 15–41)
Albumin: 3.3 g/dL — ABNORMAL LOW (ref 3.5–5.0)
Alkaline Phosphatase: 93 U/L (ref 38–126)
BILIRUBIN TOTAL: 0.4 mg/dL (ref 0.3–1.2)
BUN: 21 mg/dL — AB (ref 6–20)
CALCIUM: 9.3 mg/dL (ref 8.9–10.3)
CO2: 22 mmol/L (ref 22–32)
CREATININE: 0.98 mg/dL (ref 0.44–1.00)
Chloride: 109 mmol/L (ref 101–111)
GFR, EST AFRICAN AMERICAN: 60 mL/min — AB (ref >60)
GFR, EST NON AFRICAN AMERICAN: 52 mL/min — AB (ref >60)
GLUCOSE: 111 mg/dL — AB (ref 65–99)
POTASSIUM: 4.6 mmol/L (ref 3.5–5.1)
Sodium: 139 mmol/L (ref 135–145)
TOTAL PROTEIN: 6.8 g/dL (ref 6.5–8.1)

## 2016-05-02 LAB — MAGNESIUM: MAGNESIUM: 1.8 mg/dL (ref 1.7–2.4)

## 2016-05-02 MED ORDER — IRINOTECAN HCL CHEMO INJECTION 100 MG/5ML
65.0000 mg/m2 | Freq: Once | INTRAVENOUS | Status: AC
  Administered 2016-05-02: 100 mg via INTRAVENOUS
  Filled 2016-05-02: qty 5

## 2016-05-02 MED ORDER — DEXAMETHASONE SODIUM PHOSPHATE 10 MG/ML IJ SOLN
10.0000 mg | Freq: Once | INTRAMUSCULAR | Status: AC
  Administered 2016-05-02: 10 mg via INTRAVENOUS
  Filled 2016-05-02: qty 1

## 2016-05-02 MED ORDER — SODIUM CHLORIDE 0.9 % IV SOLN
Freq: Once | INTRAVENOUS | Status: AC
  Administered 2016-05-02: 12:00:00 via INTRAVENOUS

## 2016-05-02 MED ORDER — SODIUM CHLORIDE 0.9% FLUSH
10.0000 mL | INTRAVENOUS | Status: DC | PRN
  Administered 2016-05-02: 10 mL
  Filled 2016-05-02: qty 10

## 2016-05-02 MED ORDER — PALONOSETRON HCL INJECTION 0.25 MG/5ML
0.2500 mg | Freq: Once | INTRAVENOUS | Status: AC
  Administered 2016-05-02: 0.25 mg via INTRAVENOUS
  Filled 2016-05-02: qty 5

## 2016-05-02 MED ORDER — HEPARIN SOD (PORK) LOCK FLUSH 100 UNIT/ML IV SOLN
INTRAVENOUS | Status: AC
  Filled 2016-05-02: qty 5

## 2016-05-02 MED ORDER — ATROPINE SULFATE 1 MG/ML IJ SOLN
0.5000 mg | Freq: Once | INTRAMUSCULAR | Status: AC | PRN
  Administered 2016-05-02: 0.5 mg via INTRAVENOUS
  Filled 2016-05-02: qty 1

## 2016-05-02 MED ORDER — HEPARIN SOD (PORK) LOCK FLUSH 100 UNIT/ML IV SOLN
500.0000 [IU] | Freq: Once | INTRAVENOUS | Status: AC | PRN
  Administered 2016-05-02: 500 [IU]
  Filled 2016-05-02: qty 5

## 2016-05-02 NOTE — Progress Notes (Signed)
[name], MD [place] [address]  Colon cancer metastasized to liver [facility]) - Plan: CT Abdomen Pelvis W Contrast  CURRENT THERAPY: Palliative Irinotecan days 1, 8, 15, 22 every 42 days beginning on 02/08/2016  INTERVAL HISTORY: Karen [name] 83 y.o. female returns for followup of Stage IV CRC, KRAS wild-type, S/P microwave ablation of a solitary liver lesion on 09/01/2014.  S/P palliative Xeloda 1500 mg BID 7 days on and 7 days off beginning 01/2015 with progression of disease identified in March 2017 resulting in a discontinuation in [place] therapy.  She was then on Vectibix every 2 weeks beginning on 04/20/2015- 01/07/2016.  Restaging CT imaging on 01/09/2016 demonstrated new hepatic disease leading to a change in therapy to Irinotecan on 02/08/2016.    Colon cancer metastasized to liver [facility])   04/12/1987 Definitive Surgery    Dr. Lindalou Hose at [facility]      09/01/2014 Pathology Results    Liver, needle/core biopsy - METASTATIC ADENOCARCINOMA.      09/01/2014 Miscellaneous    MWA left liver lesion, [name] (IR)      01/18/2015 Progression    PET scan      01/18/2015 PET scan    Recurrence of  tumor within segment 2 of the liver. There are 2 new lesions identified within segment 5 and segment 6 of the liver worrisome for additional sites of metastasis. Evidence of tumor recurrence at the intra colonic anastomosis is noted...      01/28/2015 - 04/16/2015 Chemotherapy    Xeloda 1500 mg BID 7 days on and 7 days off beginning in December 2016.      04/12/2015 Imaging    CT C/A/P progression of disease, enlargement of metastatic lesion in segments 2/3 of liver, development of mass with colon at junction of descending colon and sigmoid, indeterminate lesion in lower pole of the left kidney      04/20/2015 - 01/07/2016 Antibody Plan    Vectibix every 2 weeks, single-agent.      06/12/2015 - 06/13/2015 Hospital Admission    Hypotension, diarrhea,  ARF, UTI      06/15/2015 Adverse Reaction    Vectibix rash      06/15/2015 Treatment Plan Change    Vectibix reduced to 5 mg/m2      07/13/2015 Treatment Plan Change    Treatment deferred due to hypomagnesemia and palpitations.  Escorted to ED.      09/24/2015 Imaging    CT abd/pelvis-  Liver lesions as follows:  Segment 2 left liver lobe hypodense 3.5 x 2.2 cm liver mass, previously 3.1 x 2.0 cm, mildly increased. Medial segment 6 right liver lobe 1.7 x 0.9 cm hypodense liver mass, new. Peripheral segment 6 right liver lobe 1.0 x 0.7 cm hypodense liver mass, new. Serosal 0.7 cm segment 5 right liver lobe mass, previously 0.6 cm, not appreciably changed. Hypodense 0.6 cm liver lesion adjacent to the IVC, previously 0.6 cm, unchanged.      09/24/2015 Imaging    CT chest- New 3 mm right upper lobe pulmonary nodule, indeterminate, pulmonary metastasis not excluded.      11/04/2015 Imaging    CT abd/pelvis- Mechanical small-bowel obstruction at the level of the mid small bowel, probably due to adhesions. No free air. No fluid collections. 2. Liver metastases appear stable. No new or progressive metastatic disease in the abdomen or pelvis.      01/09/2016 Imaging    CT CAP- Interval  increase in size and prominence of RIGHT upper lobe pulmonary nodule measuring 4 mm is highly concerning for pulmonary metastasis. New enhancing lesion in the LEFT lateral hepatic lobe consistent with progression of hepatic metastasis. Stable mass along the descending colon was hypermetabolic on comparison PET-CT scan.      01/09/2016 Progression    CT imaging demonstrates new hepatic lobe lesion      02/08/2016 -  Chemotherapy    The patient had palonosetron (ALOXI) injection 0.25 mg, 0.25 mg, Intravenous,  Once, 1 of 4 cycles  irinotecan (CAMPTOSAR) 100 mg in dextrose 5 % 500 mL chemo infusion, 65 mg/m2 = 100 mg (100 % of original dose 65 mg/m2), Intravenous,  Once, 1 of 4 cycles Dose modification: 100 mg/m2  (original dose 65 mg/m2, Cycle 1, Reason: Provider Judgment), 75 mg/m2 (original dose 65 mg/m2, Cycle 1, Reason: Provider Judgment), 65 mg/m2 (original dose 65 mg/m2, Cycle 1, Reason: Provider Judgment), 75 mg/m2 (original dose 65 mg/m2, Cycle 1, Reason: Provider Judgment)  for chemotherapy treatment.        03/28/2016 Imaging    CT angio chest- No definite evidence of pulmonary embolus.  7 mm right upper lobe pulmonary nodule is noted consistent with metastatic lesion.  Probable 6 cm left hepatic metastatic lesion is noted as well.       Weight is down 10-15 lbs over the last 3 months.  Rd and Dr. Laural Golden has been following the patient along with [place] and has recommended NG versus gastric tube placement.  I wonder if some of her symptoms are Irinotecan-induced, but the patient is unwilling to stop therapy.  She notes BRBPR on toilet paper only daily in the AM.  She denies blood being on clothes.  She reports that Monday Am it "was right much."  She also notes some low abdominal discomfort that comes and goes.  She notes that it has been ongoing since being on current chemotherapy regimen.  She notes that it was worse that typical yesterday.  No urinary pain or burning.   She notes improvement in her abdominal pain with Bentyl.  She continues to have right hip and groin pain.  This is chronic and has been followed by orthopod with injections.  She notes that this pain has increased over the past 2 days.  This pain is chronic.  Review of Systems  Constitutional: Positive for weight loss. Negative for chills and fever.  HENT: Negative.   Respiratory: Negative.  Negative for cough.   Cardiovascular: Negative.  Negative for chest pain.  Gastrointestinal: Positive for abdominal pain and diarrhea. Negative for blood in stool, constipation, melena, nausea and vomiting.       BRBPR  Genitourinary: Negative.   Musculoskeletal: Positive for joint pain (right hip, chronic).  Skin: Negative.     Neurological: Negative.  Negative for weakness.  Endo/Heme/Allergies: Negative.   Psychiatric/Behavioral: Negative.     Past Medical History:  Diagnosis Date  . Abdominal adhesions   . Achalasia    a. s/p botox injection for achalasia.  . Anemia   . Arthritis   . Asthma    hx of  . Blood transfusion   . Breast lump   . Cancer ([place])    bladder  . Chest pain    a. 2002 Cath: nl cors;  b. 2009 aden mv: nl;  c. 05/2009 Echo: nl. d. 2014: normal nuclear stress test, EF 69%.  . Chronic diastolic CHF (congestive heart failure) (HCC)    a. nl EF  by echo 05/2009.  . CKD (chronic kidney disease), stage III    pt. states decreased kidney function  . Colon cancer ([place])    a. recurrence 2016 with liver mets -  percutaneous liver biopsy and thermal ablation of a liver metastasis by interventional radiology..  . Colon polyp   . Depression   . Diabetes mellitus without complication ([place]) 03/21/45   borderline type II; takes no medication for it  . DVT of deep femoral vein ([place])   . Family history of bladder cancer   . Family history of breast cancer   . Family history of colon cancer   . Fibromyalgia   . GERD (gastroesophageal reflux disease)   . Headache   . Hearing loss   . Hernia   . History of blood clots   . History of PSVT (paroxysmal supraventricular tachycardia)   . Hyperlipidemia   . Hypertension   . Ileostomy present ([place])   . Iron deficiency anemia due to chronic blood loss 07/06/2013  . LBBB (left bundle branch block)   . Obstruction of intestine or colon 06/2014  . Sinus bradycardia    a. HR 30s in 08/2014 requiring holding of digoxin.  Marland Kitchen Thyroid disease   . UTI (urinary tract infection)   . Vision abnormalities 2016   not going blind but having retina problems; might lose ability to see faces  . Wears dentures     Past Surgical History:  Procedure Laterality Date  . ABDOMINAL HYSTERECTOMY    . APPENDECTOMY    . BOTOX INJECTION N/A 09/01/2013   Procedure: BOTOX  INJECTION;  Surgeon: Rogene Houston, MD;  Location: [facility] ENDO SUITE;  Service: Endoscopy;  Laterality: N/A;  . BREAST SURGERY     right breast biopsy  . CARDIAC CATHETERIZATION  12/10/2000   THE LEFT VENTRICLE IS MILDY DILATED. THERE IS MILD TO MODERATE DIFFUSE HYPOKINESIS WITH EF 35%  . CATARACT EXTRACTION W/PHACO  11/13/2011   Procedure: CATARACT EXTRACTION PHACO AND INTRAOCULAR LENS PLACEMENT (IOC);  Surgeon: Tonny Branch, MD;  Location: [facility] ORS;  Service: Ophthalmology;  Laterality: Right;  CDE 12.26  . CATARACT EXTRACTION W/PHACO  12/08/2011   Procedure: CATARACT EXTRACTION PHACO AND INTRAOCULAR LENS PLACEMENT (IOC);  Surgeon: Tonny Branch, MD;  Location: [facility] ORS;  Service: Ophthalmology;  Laterality: Left;  CDE 15.11  . CHOLECYSTECTOMY    . COLON CANCER SURGERY    . COLON SURGERY    . COLONOSCOPY  09/26/2011   Procedure: COLONOSCOPY;  Surgeon: Rogene Houston, MD;  Location: [facility] ENDO SUITE;  Service: Endoscopy;  Laterality: N/A;  215  . CORONARY ANGIOPLASTY    . ESOPHAGEAL DILATION N/A 01/18/2016   Procedure: ESOPHAGEAL DILATION;  Surgeon: Rogene Houston, MD;  Location: [facility] END[facility] SU[facility];  Service: Endoscopy;  Laterality: N/A;  . ESOPHAGOGASTRODUODENOSCOPY  02/13/2011   Procedure: ESOPHAGOGASTRODUODENOSCOPY (EGD);  Surgeon: Rogene Houston, MD;  Location: [facility];  Service: Endoscopy;  Laterality: N/A;  1030  . ESOPHAGOGASTRODUODENOSCOPY N/A 09/01/2013   Procedure: ESOPHAGOGASTRODUODENOSCOPY (EGD);  Surgeon: Rogene Houston, MD;  Location: [facility];  Service: Endoscopy;  Laterality: N/A;  830  . ESOPHAGOGASTRODUODENOSCOPY N/A 01/18/2016   Procedure: ESOPHAGOGASTRODUODENOSCOPY (EGD);  Surgeon: Rogene Houston, MD;  Location: [facility];  Service: Endoscopy;  Laterality: N/A;  1120  . EYE SURGERY  2014   catarac surgery  . ILEOSTOMY    . ILEOSTOMY    . JOINT REPLACEMENT Left 2008  . liver biopsy and ablation  09/01/14  .  ovarian cystectomy 1955    . PORTACATH PLACEMENT Left 02/08/2016    Procedure: INSERTION PORT-A-CATH;  Surgeon: Aviva Signs, MD;  Location: [facility] ORS;  Service: General;  Laterality: Left;  . ROTATOR CUFF REPAIR    . TONSILLECTOMY      Family History  Problem Relation Age of Onset  . Heart disease Mother   . Stroke Mother     deceased  . Arthritis Mother   . Bladder Cancer Mother     dx in her 51s  . Colon cancer Mother   . Arthritis Father   . Heart disease Father     decesaed  . Leukemia Father   . Stroke Sister     alive/debilitated  . Hypertension Sister   . Other Sister     paralysis  . Heart disease Brother     bypass surgery  . Arthritis Brother   . Colon cancer Brother   . Bladder Cancer Brother   . Stroke Sister     alive/debilitated  . Diabetes Sister   . Other Brother     stomach problems  . Other Brother     bladder  . Colon cancer Paternal Aunt   . Bladder Cancer Other     dx twice in her 30s-40s  . Breast cancer Other     great niece dx in her early 90s  . Prostate cancer Other     newphew  . Colon polyps Daughter   . Stroke    . Hypertension      Social History   Social History  . Marital status: Married    Spouse name: N/A  . Number of children: 4  . Years of education: N/A   Social History Main Topics  . Smoking status: Former Smoker    Packs/day: 0.25    Years: 13.00    Types: Cigarettes    Quit date: 02/11/1963  . Smokeless tobacco: Never Used  . Alcohol use No  . Drug use: No  . Sexual activity: No   Other Topics Concern  . None   Social History Narrative  . None     PHYSICAL EXAMINATION  ECOG PERFORMANCE STATUS: 1 - Symptomatic but completely ambulatory  Vitals:   05/02/16 1028  BP: (!) 122/56  Pulse: 69  Resp: 16  Temp: 99.1 F (37.3 C)     GENERAL:alert, no distress, well nourished, well developed, comfortable, cooperative, smiling and accompanied, accompanied by daughter SKIN: skin color, texture, turgor are normal, no rashes or significant lesions HEAD: Normocephalic, No  masses, lesions, tenderness or abnormalities EYES: normal, EOMI, Conjunctiva are pink and non-injected EARS: External ears normal, hearing aids in place OROPHARYNX:lips, buccal mucosa, and tongue normal and mucous membranes are moist, dentures are in place. NECK: supple, no adenopathy, trachea midline LYMPH:  no palpable lymphadenopathy BREAST:not examined LUNGS: clear to auscultation  HEART: regular rate & rhythm ABDOMEN:abdomen soft and normal bowel sounds BACK: Back symmetric, no curvature. EXTREMITIES:less then 2 second capillary refill, no joint deformities, effusion, or inflammation, no skin discoloration, no cyanosis  NEURO: alert & oriented x 3 with fluent speech, no focal motor/sensory deficits, gait normal   LABORATORY DATA: CBC    Component Value Date/Time   WBC 6.0 05/02/2016 1034   RBC 3.25 (L) 05/02/2016 1034   HGB 9.4 (L) 05/02/2016 1034   HCT 29.4 (L) 05/02/2016 1034   PLT 210 05/02/2016 1034   MCV 90.5 05/02/2016 1034   MCH 28.9 05/02/2016 1034   MCHC 32.0 05/02/2016  1034   RDW 15.5 05/02/2016 1034   LYMPHSABS 1.3 05/02/2016 1034   MONOABS 1.0 05/02/2016 1034   EOSABS 0.1 05/02/2016 1034   BASOSABS 0.0 05/02/2016 1034      Chemistry      Component Value Date/Time   NA 139 05/02/2016 1034   K 4.6 05/02/2016 1034   CL 109 05/02/2016 1034   CO2 22 05/02/2016 1034   BUN PENDING 05/02/2016 1034   CREATININE PENDING 05/02/2016 1034   CREATININE 0.95 (H) 12/07/2014 1512      Component Value Date/Time   CALCIUM 9.3 05/02/2016 1034   ALKPHOS PENDING 05/02/2016 1034   AST PENDING 05/02/2016 1034   ALT PENDING 05/02/2016 1034   BILITOT PENDING 05/02/2016 1034     Lab Results  Component Value Date   CEA 185.0 (H) 04/11/2016     PENDING LABS:   RADIOGRAPHIC STUDIES:  No results found.   PATHOLOGY:    ASSESSMENT AND PLAN:  Colon cancer metastasized to liver (HCC) Stage IV CRC, KRAS wild-type, S/P microwave ablation of a solitary liver lesion  on 09/01/2014.  S/P palliative Xeloda 1500 mg BID 7 days on and 7 days off beginning 01/2015 with progression of disease identified in March 2017 resulting in a discontinuation in [place] therapy.  She was then on Vectibix every 2 weeks beginning on 04/20/2015- 01/07/2016.  Restaging CT imaging on 01/09/2016 demonstrated new hepatic disease leading to a change in therapy to Irinotecan on 02/08/2016.  Oncology history updated.  Pre-treatment labs: CBC diff, CMET, Magnesium.  I personally reviewed and went over laboratory results with the patient.  The results are noted within this dictation.  CEA has been demonstrating signs of improvement in disease.  CEA is ordered for day 1 of each cycle.  Goals of care are yet again addresses and her goal is to "get better."  Again, she is educated that her disease is incurable.  "But God can cure it."  Code Status has been discussed on multiple occassions and she remains very unrealistic regarding goals of care.  "I want to live as long as I can."  She wants to remain a FULL CODE.  "The good Lord will take me when he is ready, even if I'm on a respiratory."   Weight loss is noted and Rd is following patient along.  Restaging CT scan is ordered for 4-5 weeks of abd/pelvis.  Chart is reviewed and there is some discussion regarding need for NG/gastric tube placement.  Patient reports that we are to page Dr. Laural Golden when patient is in the clinic so he may discuss these options with the patient.  We have called Dr. Olevia Perches office and they are closed.  He is not on-call today (according to Amion).  I do not have a way to get in touch with him.  Return as scheduled for treatment.  Return in 6 weeks for follow-up and medical oncology recommendations based upon restaging CT scan.   ORDERS PLACED FOR THIS ENCOUNTER: Orders Placed This Encounter  Procedures  . CT Abdomen Pelvis W Contrast    MEDICATIONS PRESCRIBED THIS ENCOUNTER: No orders of the defined types  were placed in this encounter.   THERAPY PLAN:  Continue with salvage (last line therapy) as outlined above with ongoing discussions regarding goals of care.  All questions were answered. The patient knows to call the clinic with any problems, questions or concerns. We can certainly see the patient much sooner if necessary.  Patient and plan discussed with Dr.  Twana First and she is in agreement with the aforementioned.   This note is electronically signed by: Doy Mince 05/02/2016 11:31 AM

## 2016-05-02 NOTE — Assessment & Plan Note (Addendum)
Stage IV CRC, KRAS wild-type, S/P microwave ablation of a solitary liver lesion on 09/01/2014.  S/P palliative Xeloda 1500 mg BID 7 days on and 7 days off beginning 01/2015 with progression of disease identified in March 2017 resulting in a discontinuation in [place] therapy.  She was then on Vectibix every 2 weeks beginning on 04/20/2015- 01/07/2016.  Restaging CT imaging on 01/09/2016 demonstrated new hepatic disease leading to a change in therapy to Irinotecan on 02/08/2016.  Oncology history updated.  Pre-treatment labs: CBC diff, CMET, Magnesium.  I personally reviewed and went over laboratory results with the patient.  The results are noted within this dictation.  CEA has been demonstrating signs of improvement in disease.  CEA is ordered for day 1 of each cycle.  Goals of care are yet again addresses and her goal is to "get better."  Again, she is educated that her disease is incurable.  "But God can cure it."  Code Status has been discussed on multiple occassions and she remains very unrealistic regarding goals of care.  "I want to live as long as I can."  She wants to remain a FULL CODE.  "The good Lord will take me when he is ready, even if I'm on a respiratory."   Weight loss is noted and Rd is following patient along.  Restaging CT scan is ordered for 4-5 weeks of abd/pelvis.  Chart is reviewed and there is some discussion regarding need for NG/gastric tube placement.  Patient reports that we are to page Dr. Laural Golden when patient is in the clinic so he may discuss these options with the patient.  We have called Dr. Olevia Perches office and they are closed.  He is not on-call today (according to Amion).  I do not have a way to get in touch with him.  Return as scheduled for treatment.  Return in 6 weeks for follow-up and medical oncology recommendations based upon restaging CT scan.

## 2016-05-02 NOTE — Patient Instructions (Addendum)
[place] at [facility] Discharge Instructions  RECOMMENDATIONS MADE BY THE CONSULTANT AND ANY TEST RESULTS WILL BE SENT TO YOUR REFERRING PHYSICIAN.  You saw Kirby Crigler, PA-C, today. CT of abd/pelvis in 4-5 weeks. Follow up in 6 weeks. See [name] at checkout for appointments.  Thank you for choosing [place] at [facility] to provide your oncology and hematology care.  To afford each patient quality time with our provider, please arrive at least 15 minutes before your scheduled appointment time.    If you have a lab appointment with the [facility] please come in thru the  Main Entrance and check in at the main information desk  You need to re-schedule your appointment should you arrive 10 or more minutes late.  We strive to give you quality time with our providers, and arriving late affects you and other patients whose appointments are after yours.  Also, if you no show three or more times for appointments you may be dismissed from the clinic at the providers discretion.     Again, thank you for choosing [facility].  Our hope is that these requests will decrease the amount of time that you wait before being seen by our physicians.       _____________________________________________________________  Should you have questions after your visit to [facility], please contact our office at [phone] between the hours of 8:30 a.m. and 4:30 p.m.  Voicemails left after 4:30 p.m. will not be returned until the following business day.  For prescription refill requests, have your pharmacy contact our office.       Resources For Cancer Patients and their Caregivers ? American Cancer Society: Can assist with transportation, wigs, general needs, runs Look Good Feel Better.        (657)128-7134 ? Cancer Care: Provides financial assistance, online support groups, medication/co-pay assistance.  1-800-813-HOPE  (765) 453-8904) ? [place] Assists [place] cancer patients and their families through emotional , educational and financial support.  (432)670-2090 ? [place] DSS Where to apply for food stamps, Medicaid and utility assistance. (907)075-2327 ? RCATS: Transportation to medical appointments. (825)358-0345 ? Social Security Administration: May apply for disability if have a Stage IV cancer. 902-623-0284 810-059-5257 ? [employer], Disability and Transit Services: Assists with nutrition, care and transit needs. [place] Support Programs: @10RELATIVEDAYS @ > Cancer Support Group  2nd Tuesday of the month 1pm-2pm, [name]  > Creative Journey  3rd Tuesday of the month 1130am-1pm, [name]  > Look Good Feel Better  1st Wednesday of the month 10am-12 noon, [name] (Call [place] to register [phone])

## 2016-05-02 NOTE — Patient Instructions (Signed)
[facility] Discharge Instructions for Patients Receiving Chemotherapy   Beginning January 23rd 2017 lab work for the [facility] will be done in the  Main lab at [facility] on 1st floor. If you have a lab appointment with the [facility] please come in thru the  Main Entrance and check in at the main information desk   Today you received the following chemotherapy agents Irinotecan. Follow-up as scheduled. Call clinic for any questions or concerns  To help prevent nausea and vomiting after your treatment, we encourage you to take your nausea medication   If you develop nausea and vomiting, or diarrhea that is not controlled by your medication, call the clinic.  The clinic phone number is [phone]. Office hours are Monday-Friday 8:30am-5:00pm.  BELOW ARE SYMPTOMS THAT SHOULD BE REPORTED IMMEDIATELY:  *FEVER GREATER THAN 101.0 F  *CHILLS WITH OR WITHOUT FEVER  NAUSEA AND VOMITING THAT IS NOT CONTROLLED WITH YOUR NAUSEA MEDICATION  *UNUSUAL SHORTNESS OF BREATH  *UNUSUAL BRUISING OR BLEEDING  TENDERNESS IN MOUTH AND THROAT WITH OR WITHOUT PRESENCE OF ULCERS  *URINARY PROBLEMS  *BOWEL PROBLEMS  UNUSUAL RASH Items with * indicate a potential emergency and should be followed up as soon as possible. If you have an emergency after office hours please contact your primary care physician or go to the nearest emergency department.  Please call the clinic during office hours if you have any questions or concerns.   You may also contact the Patient Navigator at [phone] should you have any questions or need assistance in obtaining follow up care.      Resources For Cancer Patients and their Caregivers ? American Cancer Society: Can assist with transportation, wigs, general needs, runs Look Good Feel Better.        (410)502-9327 ? Cancer Care: Provides financial assistance, online support groups, medication/co-pay assistance.  1-800-813-HOPE  6576182439) ? [place] Assists [place] cancer patients and their families through emotional , educational and financial support.  825-494-6480 ? [place] DSS Where to apply for food stamps, Medicaid and utility assistance. 219-555-2620 ? RCATS: Transportation to medical appointments. 301 821 7448 ? Social Security Administration: May apply for disability if have a Stage IV cancer. 9315295941 236-149-1347 ? [employer], Disability and Transit Services: Assists with nutrition, care and transit needs. 306-626-9446

## 2016-05-02 NOTE — Progress Notes (Signed)
Karen [name] tolerated chemo tx well without complaints or incident.Labs reviewed with Dr. Irene Limbo prior to administering chemotherapy. VSS upon discharge. Pt discharged via wheelchair in satisfactory condition accompanied by her daughter

## 2016-05-02 NOTE — Progress Notes (Addendum)
Nutrition Follow-up:  Spoke with patient during clinic visit this am.  Daughter with patient.    Patient reports, " I am eating better." When asked to describe a typical day as far as eating patient reports I eat everything and I cook for my husband.  Ate cereal this am for breakfast.  Reports she eats different things.  Patient reports that her GI doctor had talked about a feeding tube for her recently.  She is not interested in that at this time.  She feels like she is eating well currently.   Noted patient wants to continue with being full code.  Medications: reviewed  Labs: reviewed  Anthropometrics:   Weight has decreased to 121 pounds 11.2 oz today from 126 pounds 14.4 oz on 2/16.    13% weight loss in the last 2 months, significant   NUTRITION DIAGNOSIS: Inadequate oral intake continues   MALNUTRITION DIAGNOSIS: Severe malnutrition continues   INTERVENTION:   Encouraged patient to continue consuming high calorie, high protein foods.  Fact sheet has been given on past visits. Patient may benefit from appetite stimulant Per patient report she is not interested in feeding tube at this time. If feeding tube considered recommend G-tube vs NG tube.      MONITORING, EVALUATION, GOAL: Patient will consume adequate calories and protein to prevent weight loss   NEXT VISIT: as needed  Cortlynn Hollinsworth B. Zenia Resides, [place], [place] Registered Dietitian [phone] (pager)

## 2016-05-12 ENCOUNTER — Encounter (HOSPITAL_COMMUNITY): Payer: Medicare Other | Attending: Hematology and Oncology

## 2016-05-12 VITALS — BP 117/48 | HR 66 | Temp 97.8°F | Resp 16 | Wt 119.6 lb

## 2016-05-12 DIAGNOSIS — R109 Unspecified abdominal pain: Secondary | ICD-10-CM

## 2016-05-12 DIAGNOSIS — C189 Malignant neoplasm of colon, unspecified: Secondary | ICD-10-CM | POA: Diagnosis not present

## 2016-05-12 DIAGNOSIS — C787 Secondary malignant neoplasm of liver and intrahepatic bile duct: Secondary | ICD-10-CM

## 2016-05-12 DIAGNOSIS — Z5111 Encounter for antineoplastic chemotherapy: Secondary | ICD-10-CM

## 2016-05-12 DIAGNOSIS — D509 Iron deficiency anemia, unspecified: Secondary | ICD-10-CM | POA: Insufficient documentation

## 2016-05-12 LAB — CBC WITH DIFFERENTIAL/PLATELET
Basophils Absolute: 0 10*3/uL (ref 0.0–0.1)
Basophils Relative: 0 %
EOS ABS: 0 10*3/uL (ref 0.0–0.7)
EOS PCT: 1 %
HCT: 28.7 % — ABNORMAL LOW (ref 36.0–46.0)
Hemoglobin: 9.3 g/dL — ABNORMAL LOW (ref 12.0–15.0)
Lymphocytes Relative: 25 %
Lymphs Abs: 1.1 10*3/uL (ref 0.7–4.0)
MCH: 29.2 pg (ref 26.0–34.0)
MCHC: 32.4 g/dL (ref 30.0–36.0)
MCV: 90 fL (ref 78.0–100.0)
Monocytes Absolute: 0.5 10*3/uL (ref 0.1–1.0)
Monocytes Relative: 11 %
Neutro Abs: 2.6 10*3/uL (ref 1.7–7.7)
Neutrophils Relative %: 63 %
PLATELETS: 240 10*3/uL (ref 150–400)
RBC: 3.19 MIL/uL — AB (ref 3.87–5.11)
RDW: 15.4 % (ref 11.5–15.5)
WBC: 4.2 10*3/uL (ref 4.0–10.5)

## 2016-05-12 LAB — COMPREHENSIVE METABOLIC PANEL
ALT: 16 U/L (ref 14–54)
ANION GAP: 10 (ref 5–15)
AST: 24 U/L (ref 15–41)
Albumin: 3.4 g/dL — ABNORMAL LOW (ref 3.5–5.0)
Alkaline Phosphatase: 90 U/L (ref 38–126)
BILIRUBIN TOTAL: 0.5 mg/dL (ref 0.3–1.2)
BUN: 22 mg/dL — ABNORMAL HIGH (ref 6–20)
CALCIUM: 9.6 mg/dL (ref 8.9–10.3)
CO2: 20 mmol/L — ABNORMAL LOW (ref 22–32)
Chloride: 109 mmol/L (ref 101–111)
Creatinine, Ser: 1.05 mg/dL — ABNORMAL HIGH (ref 0.44–1.00)
GFR, EST AFRICAN AMERICAN: 55 mL/min — AB (ref >60)
GFR, EST NON AFRICAN AMERICAN: 47 mL/min — AB (ref >60)
Glucose, Bld: 103 mg/dL — ABNORMAL HIGH (ref 65–99)
Potassium: 4.1 mmol/L (ref 3.5–5.1)
SODIUM: 139 mmol/L (ref 135–145)
TOTAL PROTEIN: 6.9 g/dL (ref 6.5–8.1)

## 2016-05-12 LAB — MAGNESIUM: MAGNESIUM: 1.8 mg/dL (ref 1.7–2.4)

## 2016-05-12 MED ORDER — IRINOTECAN HCL CHEMO INJECTION 100 MG/5ML
65.0000 mg/m2 | Freq: Once | INTRAVENOUS | Status: AC
  Administered 2016-05-12: 100 mg via INTRAVENOUS
  Filled 2016-05-12: qty 5

## 2016-05-12 MED ORDER — SODIUM CHLORIDE 0.9 % IV SOLN
Freq: Once | INTRAVENOUS | Status: AC
  Administered 2016-05-12: 13:00:00 via INTRAVENOUS

## 2016-05-12 MED ORDER — DEXAMETHASONE SODIUM PHOSPHATE 10 MG/ML IJ SOLN
10.0000 mg | Freq: Once | INTRAMUSCULAR | Status: AC
  Administered 2016-05-12: 10 mg via INTRAVENOUS
  Filled 2016-05-12: qty 1

## 2016-05-12 MED ORDER — DICYCLOMINE HCL 10 MG PO CAPS: 10.0000 mg | ORAL_CAPSULE | Freq: Four times a day (QID) | ORAL | 1 refills | Status: AC | PRN

## 2016-05-12 MED ORDER — PALONOSETRON HCL INJECTION 0.25 MG/5ML
0.2500 mg | Freq: Once | INTRAVENOUS | Status: AC
  Administered 2016-05-12: 0.25 mg via INTRAVENOUS
  Filled 2016-05-12: qty 5

## 2016-05-12 MED ORDER — HEPARIN SOD (PORK) LOCK FLUSH 100 UNIT/ML IV SOLN
500.0000 [IU] | Freq: Once | INTRAVENOUS | Status: AC | PRN
  Administered 2016-05-12: 500 [IU]
  Filled 2016-05-12: qty 5

## 2016-05-12 MED ORDER — ATROPINE SULFATE 1 MG/ML IJ SOLN
0.5000 mg | Freq: Once | INTRAMUSCULAR | Status: AC | PRN
  Administered 2016-05-12: 0.5 mg via INTRAVENOUS
  Filled 2016-05-12: qty 1

## 2016-05-12 MED ORDER — HEPARIN SOD (PORK) LOCK FLUSH 100 UNIT/ML IV SOLN
INTRAVENOUS | Status: AC
  Filled 2016-05-12: qty 5

## 2016-05-12 MED ORDER — HYDROCODONE-ACETAMINOPHEN 7.5-325 MG PO TABS: 1.0000 | ORAL_TABLET | ORAL | 0 refills | Status: DC | PRN

## 2016-05-12 NOTE — Patient Instructions (Signed)
[facility] Discharge Instructions for Patients Receiving Chemotherapy   Beginning January 23rd 2017 lab work for the [facility] will be done in the  Main lab at [facility] on 1st floor. If you have a lab appointment with the [facility] please come in thru the  Main Entrance and check in at the main information desk   Today you received the following chemotherapy agents irinotecan.  To help prevent nausea and vomiting after your treatment, we encourage you to take your nausea medication as instructed.   If you develop nausea and vomiting, or diarrhea that is not controlled by your medication, call the clinic.  The clinic phone number is [phone]. Office hours are Monday-Friday 8:30am-5:00pm.  BELOW ARE SYMPTOMS THAT SHOULD BE REPORTED IMMEDIATELY:  *FEVER GREATER THAN 101.0 F  *CHILLS WITH OR WITHOUT FEVER  NAUSEA AND VOMITING THAT IS NOT CONTROLLED WITH YOUR NAUSEA MEDICATION  *UNUSUAL SHORTNESS OF BREATH  *UNUSUAL BRUISING OR BLEEDING  TENDERNESS IN MOUTH AND THROAT WITH OR WITHOUT PRESENCE OF ULCERS  *URINARY PROBLEMS  *BOWEL PROBLEMS  UNUSUAL RASH Items with * indicate a potential emergency and should be followed up as soon as possible. If you have an emergency after office hours please contact your primary care physician or go to the nearest emergency department.  Please call the clinic during office hours if you have any questions or concerns.   You may also contact the Patient Navigator at [phone] should you have any questions or need assistance in obtaining follow up care.      Resources For Cancer Patients and their Caregivers ? American Cancer Society: Can assist with transportation, wigs, general needs, runs Look Good Feel Better.        [phone] ? Cancer Care: Provides financial assistance, online support groups, medication/co-pay assistance.  1-800-813-HOPE 305-350-2512) ? [place] Assists  [place] cancer patients and their families through emotional , educational and financial support.  4240679233 ? [place] DSS Where to apply for food stamps, Medicaid and utility assistance. [phone] ? RCATS: Transportation to medical appointments. 203-688-1154 ? Social Security Administration: May apply for disability if have a Stage IV cancer. 807-366-2911 [phone] ? [employer], Disability and Transit Services: Assists with nutrition, care and transit needs. [phone]

## 2016-05-12 NOTE — Progress Notes (Signed)
Tolerated chemo well. Stable on discharge home via wheelchair with family.

## 2016-05-13 ENCOUNTER — Other Ambulatory Visit (HOSPITAL_COMMUNITY): Payer: Self-pay | Admitting: Oncology

## 2016-05-13 ENCOUNTER — Encounter (HOSPITAL_COMMUNITY): Payer: Self-pay

## 2016-05-13 DIAGNOSIS — C189 Malignant neoplasm of colon, unspecified: Secondary | ICD-10-CM

## 2016-05-13 DIAGNOSIS — C787 Secondary malignant neoplasm of liver and intrahepatic bile duct: Principal | ICD-10-CM

## 2016-05-13 LAB — CEA: CEA: 768.3 ng/mL — ABNORMAL HIGH (ref 0.0–4.7)

## 2016-05-14 ENCOUNTER — Encounter (HOSPITAL_COMMUNITY): Payer: Self-pay

## 2016-05-16 ENCOUNTER — Encounter: Payer: Self-pay | Admitting: *Deleted

## 2016-05-19 ENCOUNTER — Encounter: Payer: Self-pay | Admitting: Cardiology

## 2016-05-19 ENCOUNTER — Encounter (HOSPITAL_BASED_OUTPATIENT_CLINIC_OR_DEPARTMENT_OTHER): Payer: Medicare Other

## 2016-05-19 ENCOUNTER — Encounter (HOSPITAL_COMMUNITY): Payer: Self-pay

## 2016-05-19 ENCOUNTER — Ambulatory Visit (INDEPENDENT_AMBULATORY_CARE_PROVIDER_SITE_OTHER): Payer: Medicare Other | Admitting: Cardiology

## 2016-05-19 ENCOUNTER — Ambulatory Visit (HOSPITAL_COMMUNITY): Payer: Medicare Other

## 2016-05-19 VITALS — BP 111/44 | HR 67 | Temp 98.1°F | Resp 18 | Wt 121.2 lb

## 2016-05-19 VITALS — BP 135/64 | HR 74 | Ht 60.0 in | Wt 123.2 lb

## 2016-05-19 DIAGNOSIS — R002 Palpitations: Secondary | ICD-10-CM

## 2016-05-19 DIAGNOSIS — E782 Mixed hyperlipidemia: Secondary | ICD-10-CM

## 2016-05-19 DIAGNOSIS — C189 Malignant neoplasm of colon, unspecified: Secondary | ICD-10-CM | POA: Diagnosis not present

## 2016-05-19 DIAGNOSIS — I471 Supraventricular tachycardia: Secondary | ICD-10-CM

## 2016-05-19 DIAGNOSIS — D509 Iron deficiency anemia, unspecified: Secondary | ICD-10-CM | POA: Diagnosis not present

## 2016-05-19 DIAGNOSIS — Z5111 Encounter for antineoplastic chemotherapy: Secondary | ICD-10-CM | POA: Diagnosis not present

## 2016-05-19 DIAGNOSIS — C787 Secondary malignant neoplasm of liver and intrahepatic bile duct: Secondary | ICD-10-CM | POA: Diagnosis not present

## 2016-05-19 DIAGNOSIS — I447 Left bundle-branch block, unspecified: Secondary | ICD-10-CM

## 2016-05-19 LAB — COMPREHENSIVE METABOLIC PANEL
ALK PHOS: 73 U/L (ref 38–126)
ALT: 13 U/L — ABNORMAL LOW (ref 14–54)
ANION GAP: 7 (ref 5–15)
AST: 22 U/L (ref 15–41)
Albumin: 3.3 g/dL — ABNORMAL LOW (ref 3.5–5.0)
BILIRUBIN TOTAL: 0.2 mg/dL — AB (ref 0.3–1.2)
BUN: 21 mg/dL — ABNORMAL HIGH (ref 6–20)
CALCIUM: 9.1 mg/dL (ref 8.9–10.3)
CO2: 22 mmol/L (ref 22–32)
Chloride: 109 mmol/L (ref 101–111)
Creatinine, Ser: 0.89 mg/dL (ref 0.44–1.00)
GFR calc non Af Amer: 58 mL/min — ABNORMAL LOW (ref >60)
Glucose, Bld: 105 mg/dL — ABNORMAL HIGH (ref 65–99)
Potassium: 4.3 mmol/L (ref 3.5–5.1)
SODIUM: 138 mmol/L (ref 135–145)
Total Protein: 6.5 g/dL (ref 6.5–8.1)

## 2016-05-19 LAB — CBC WITH DIFFERENTIAL/PLATELET
Basophils Absolute: 0 10*3/uL (ref 0.0–0.1)
Basophils Relative: 0 %
EOS ABS: 0.1 10*3/uL (ref 0.0–0.7)
Eosinophils Relative: 2 %
HEMATOCRIT: 27.1 % — AB (ref 36.0–46.0)
HEMOGLOBIN: 8.7 g/dL — AB (ref 12.0–15.0)
LYMPHS ABS: 1 10*3/uL (ref 0.7–4.0)
Lymphocytes Relative: 30 %
MCH: 29.2 pg (ref 26.0–34.0)
MCHC: 32.1 g/dL (ref 30.0–36.0)
MCV: 90.9 fL (ref 78.0–100.0)
MONO ABS: 0.5 10*3/uL (ref 0.1–1.0)
MONOS PCT: 15 %
NEUTROS PCT: 53 %
Neutro Abs: 1.8 10*3/uL (ref 1.7–7.7)
Platelets: 170 10*3/uL (ref 150–400)
RBC: 2.98 MIL/uL — ABNORMAL LOW (ref 3.87–5.11)
RDW: 15.4 % (ref 11.5–15.5)
WBC: 3.3 10*3/uL — ABNORMAL LOW (ref 4.0–10.5)

## 2016-05-19 LAB — MAGNESIUM: MAGNESIUM: 1.7 mg/dL (ref 1.7–2.4)

## 2016-05-19 MED ORDER — HEPARIN SOD (PORK) LOCK FLUSH 100 UNIT/ML IV SOLN
500.0000 [IU] | Freq: Once | INTRAVENOUS | Status: AC | PRN
  Administered 2016-05-19: 500 [IU]
  Filled 2016-05-19: qty 5

## 2016-05-19 MED ORDER — SODIUM CHLORIDE 0.9% FLUSH
10.0000 mL | INTRAVENOUS | Status: DC | PRN
  Administered 2016-05-19: 10 mL
  Filled 2016-05-19: qty 10

## 2016-05-19 MED ORDER — SODIUM CHLORIDE 0.9 % IV SOLN
Freq: Once | INTRAVENOUS | Status: AC
  Administered 2016-05-19: 12:00:00 via INTRAVENOUS

## 2016-05-19 MED ORDER — PALONOSETRON HCL INJECTION 0.25 MG/5ML
0.2500 mg | Freq: Once | INTRAVENOUS | Status: AC
  Administered 2016-05-19: 0.25 mg via INTRAVENOUS
  Filled 2016-05-19: qty 5

## 2016-05-19 MED ORDER — ATROPINE SULFATE 1 MG/ML IJ SOLN
0.5000 mg | Freq: Once | INTRAMUSCULAR | Status: AC | PRN
  Administered 2016-05-19: 0.5 mg via INTRAVENOUS
  Filled 2016-05-19: qty 1

## 2016-05-19 MED ORDER — IRINOTECAN HCL CHEMO INJECTION 100 MG/5ML
65.0000 mg/m2 | Freq: Once | INTRAVENOUS | Status: AC
  Administered 2016-05-19: 100 mg via INTRAVENOUS
  Filled 2016-05-19: qty 5

## 2016-05-19 MED ORDER — DEXAMETHASONE SODIUM PHOSPHATE 10 MG/ML IJ SOLN
10.0000 mg | Freq: Once | INTRAMUSCULAR | Status: AC
  Administered 2016-05-19: 10 mg via INTRAVENOUS
  Filled 2016-05-19: qty 1

## 2016-05-19 NOTE — Progress Notes (Signed)
Patient tolerated infusion well.  Ambulatory and stable at discharge from clinic, but wheeled out by daughter per patient preference.

## 2016-05-19 NOTE — Patient Instructions (Signed)
[facility] Discharge Instructions for Patients Receiving Chemotherapy   Beginning January 23rd 2017 lab work for the [facility] will be done in the  Main lab at [facility] on 1st floor. If you have a lab appointment with the [facility] please come in thru the  Main Entrance and check in at the main information desk   Today you received the following chemotherapy agent: Irinotecan.     If you develop nausea and vomiting, or diarrhea that is not controlled by your medication, call the clinic.  The clinic phone number is [phone]. Office hours are Monday-Friday 8:30am-5:00pm.  BELOW ARE SYMPTOMS THAT SHOULD BE REPORTED IMMEDIATELY:  *FEVER GREATER THAN 101.0 F  *CHILLS WITH OR WITHOUT FEVER  NAUSEA AND VOMITING THAT IS NOT CONTROLLED WITH YOUR NAUSEA MEDICATION  *UNUSUAL SHORTNESS OF BREATH  *UNUSUAL BRUISING OR BLEEDING  TENDERNESS IN MOUTH AND THROAT WITH OR WITHOUT PRESENCE OF ULCERS  *URINARY PROBLEMS  *BOWEL PROBLEMS  UNUSUAL RASH Items with * indicate a potential emergency and should be followed up as soon as possible. If you have an emergency after office hours please contact your primary care physician or go to the nearest emergency department.  Please call the clinic during office hours if you have any questions or concerns.   You may also contact the Patient Navigator at [phone] should you have any questions or need assistance in obtaining follow up care.      Resources For Cancer Patients and their Caregivers ? American Cancer Society: Can assist with transportation, wigs, general needs, runs Look Good Feel Better.        [phone] ? Cancer Care: Provides financial assistance, online support groups, medication/co-pay assistance.  1-800-813-HOPE 3123730689) ? [place] Assists [place] cancer patients and their families through emotional , educational and financial support.   7862365711 ? [place] DSS Where to apply for food stamps, Medicaid and utility assistance. 613-864-6833 ? RCATS: Transportation to medical appointments. [phone] ? Social Security Administration: May apply for disability if have a Stage IV cancer. [phone] [phone] ? [employer], Disability and Transit Services: Assists with nutrition, care and transit needs. [phone]

## 2016-05-19 NOTE — Progress Notes (Signed)
Labs printed and reviewed with Robynn Pane, PA-C.  Instructed to treat today and recheck labs next week as usual with day 22 treatment to follow up on declining Hgb.

## 2016-05-19 NOTE — Progress Notes (Signed)
Cardiology Office Note  Date: 05/19/2016   ID: Karen [name], DOB October 05, 1929, MRN [id number]  PCP: [name], MD  Primary Cardiologist: Rozann Lesches, MD   Chief Complaint  Patient presents with  . Palpitations    History of Present Illness: Karen [name] is an 83 y.o. female former patient of Dr. Mare Ferrari and more recently Dr. Martinique, now transferring care to [facility]. This is my first meeting with her today. I reviewed extensive records and updated her chart. She was most recently seen by Ms. Barrett PA-C in August 2017. At that time she was reporting intermittent palpitations. As needed low-dose Lopressor was discussed as a possibility to take in addition to her standing Coreg. She has not filled this medication.  She describes occasional palpitations but states that these are generally brief period she has not had increasing frequency or intensity. No chest pain or syncope.  Reviewed her current medications which include aspirin, Coreg, Imdur, and omega-3 supplements.  Records indicate history of metastatic colon cancer, she follows in the oncology clinic at [facility], continues to undergo chemotherapy treatments. She had Irinotecan today.  Past Medical History:  Diagnosis Date  . Abdominal adhesions   . Achalasia    a. s/p botox injection for achalasia.  . Anemia   . Arthritis   . Blood transfusion   . Breast lump   . Chest pain    a. 2002 Cath: nl cors;  b. 2009 aden mv: nl;  c. 05/2009 Echo: nl. d. 2014: normal nuclear stress test, EF 69%.  . CKD (chronic kidney disease), stage III   . Colon cancer ([place])    a. recurrence 2016 with liver mets -  percutaneous liver biopsy and thermal ablation of a liver metastasis by interventional radiology..  . Colon polyp   . Depression   . DVT of deep femoral vein ([place])   . Essential hypertension   . Fibromyalgia   . GERD (gastroesophageal reflux disease)   . Headache   . Hearing loss   . Hernia   . History of asthma     . History of bladder cancer   . History of PSVT (paroxysmal supraventricular tachycardia)   . Hyperlipidemia   . Hypothyroidism   . Ileostomy present ([place])   . Iron deficiency anemia due to chronic blood loss   . LBBB (left bundle branch block)   . Obstruction of intestine or colon 06/2014  . Sinus bradycardia    a. HR 30s in 08/2014 requiring holding of digoxin.  . Type 2 diabetes mellitus ([place])   . Vision abnormalities   . Wears dentures     Past Surgical History:  Procedure Laterality Date  . ABDOMINAL HYSTERECTOMY    . APPENDECTOMY    . BOTOX INJECTION N/A 09/01/2013   Procedure: BOTOX INJECTION;  Surgeon: Rogene Houston, MD;  Location: [facility] ENDO SU[facility];  Service: Endoscopy;  Laterality: N/A;  . BREAST SURGERY     right breast biopsy  . CARDIAC CATHETERIZATION  12/10/2000   THE LEFT VENTRICLE IS MILDY DILATED. THERE IS MILD TO MODERATE DIFFUSE HYPOKINESIS WITH EF 35%  . CATARACT EXTRACTION W/PHACO  11/13/2011   Procedure: CATARACT EXTRACTION PHACO AND INTRAOCULAR LENS PLACEMENT (IOC);  Surgeon: Tonny Branch, MD;  Location: [facility] ORS;  Service: Ophthalmology;  Laterality: Right;  CDE 12.26  . CATARACT EXTRACTION W/PHACO  12/08/2011   Procedure: CATARACT EXTRACTION PHACO AND INTRAOCULAR LENS PLACEMENT (IOC);  Surgeon: Tonny Branch, MD;  Location: [facility] ORS;  Service:  Ophthalmology;  Laterality: Left;  CDE 15.11  . CHOLECYSTECTOMY    . COLON CANCER SURGERY    . COLON SURGERY    . COLONOSCOPY  09/26/2011   Procedure: COLONOSCOPY;  Surgeon: Rogene Houston, MD;  Location: [facility] END[facility] SU[facility];  Service: Endoscopy;  Laterality: N/A;  215  . CORONARY ANGIOPLASTY    . ESOPHAGEAL DILATION N/A 01/18/2016   Procedure: ESOPHAGEAL DILATION;  Surgeon: Rogene Houston, MD;  Location: [facility];  Service: Endoscopy;  Laterality: N/A;  . ESOPHAGOGASTRODUODENOSCOPY  02/13/2011   Procedure: ESOPHAGOGASTRODUODENOSCOPY (EGD);  Surgeon: Rogene Houston, MD;  Location: [facility];  Service: Endoscopy;  Laterality:  N/A;  1030  . ESOPHAGOGASTRODUODENOSCOPY N/A 09/01/2013   Procedure: ESOPHAGOGASTRODUODENOSCOPY (EGD);  Surgeon: Rogene Houston, MD;  Location: [facility];  Service: Endoscopy;  Laterality: N/A;  830  . ESOPHAGOGASTRODUODENOSCOPY N/A 01/18/2016   Procedure: ESOPHAGOGASTRODUODENOSCOPY (EGD);  Surgeon: Rogene Houston, MD;  Location: [facility];  Service: Endoscopy;  Laterality: N/A;  1120  . EYE SURGERY  2014   catarac surgery  . ILEOSTOMY    . ILEOSTOMY    . JOINT REPLACEMENT Left 2008  . liver biopsy and ablation  09/01/14  . ovarian cystectomy 1955    . PORTACATH PLACEMENT Left 02/08/2016   Procedure: INSERTION PORT-A-CATH;  Surgeon: Aviva Signs, MD;  Location: [facility] ORS;  Service: General;  Laterality: Left;  . ROTATOR CUFF REPAIR    . TONSILLECTOMY      Current Outpatient Prescriptions  Medication Sig Dispense Refill  . ALPRAZolam (XANAX) 0.5 MG tablet Take 0.5 mg by mouth at bedtime.     Marland Kitchen aspirin EC 325 MG tablet Take 1 tablet (325 mg total) by mouth daily. 30 tablet 0  . carvedilol (COREG) 12.5 MG tablet Take 1.5 tablets (18.75 mg total) by mouth 2 (two) times daily with a meal. 90 tablet 6  . dicyclomine (BENTYL) 10 MG capsule Take 1 capsule (10 mg total) by mouth 4 (four) times daily as needed for spasms. 60 capsule 1  . diphenoxylate-atropine (LOMOTIL) 2.5-0.025 MG tablet as needed.     . fentaNYL (DURAGESIC - DOSED MCG/HR) 25 MCG/HR patch Place 1 patch (25 mcg total) onto the skin every 3 (three) days. 10 patch 0  . HYDROcodone-acetaminophen (NORCO) 7.5-325 MG tablet Take 1 tablet by mouth every 4 (four) hours as needed for moderate pain. 100 tablet 0  . hyoscyamine (LEVSIN SL) 0.125 MG SL tablet Place 1 tablet (0.125 mg total) under the tongue 3 (three) times daily as needed (lower abdominal pain). 90 tablet 2  . IRINOTECAN HCL IV Inject into the vein. Weekly    . isosorbide mononitrate (IMDUR) 30 MG 24 hr tablet Take 15 mg by mouth daily.    Marland Kitchen levothyroxine (SYNTHROID,  LEVOTHROID) 75 MCG tablet Take 75 mcg by mouth daily.     Marland Kitchen lidocaine-prilocaine (EMLA) cream Apply a quarter size amount to affected area 1 hour prior to coming to chemotherapy. 30 g 2  . magnesium oxide (MAG-OX) 400 (241.3 Mg) MG tablet Take 1 tablet (400 mg total) by mouth 3 (three) times daily. 120 tablet 1  . Multiple Vitamins-Minerals (CENTRUM WOMEN PO) Take 1 tablet by mouth daily.     Marland Kitchen NITROSTAT 0.4 MG SL tablet DISSOLVE ONE TABLET UNDER THE TONGUE EVERY 5 MINUTES AS NEEDED FOR CHEST PAIN.  DO NOT EXCEED A TOTAL OF 3 DOSES IN 15 MINUTES 25 tablet 0  . Omega-3 Fatty Acids (FISH OIL) 1000  MG CAPS Take 1 capsule by mouth daily. Reported on 02/19/2015    . ondansetron (ZOFRAN-ODT) 8 MG disintegrating tablet Take 1 tablet (8 mg total) by mouth every 8 (eight) hours as needed for nausea or vomiting. 60 tablet 3  . pantoprazole (PROTONIX) 40 MG tablet Take 40 mg by mouth daily.      No current facility-administered medications for this visit.    Allergies:  Bactrim [sulfamethoxazole-trimethoprim]; Codeine; Demerol; Lidocaine hcl; Morphine and related; Dilaudid [hydromorphone hcl]; Nitrofurantoin monohyd macro; Lisinopril; and Ramipril   Social History: The patient  reports that she quit smoking about 53 years ago. Her smoking use included Cigarettes. She has a 3.25 pack-year smoking history. She has never used smokeless tobacco. She reports that she does not drink alcohol or use drugs.   Family History: The patient's family history includes Arthritis in her brother, father, and mother; Bladder Cancer in her brother, mother, and other; Breast cancer in her other; Colon cancer in her brother, mother, and paternal aunt; Colon polyps in her daughter; Diabetes in her sister; Heart disease in her brother, father, and mother; Hypertension in her sister; Leukemia in her father; Other in her brother, brother, and sister; Prostate cancer in her other; Stroke in her mother, sister, and sister.   ROS:  Please  see the history of present illness. Otherwise, complete review of systems is positive for chronic right hip pain.  All other systems are reviewed and negative.   Physical Exam: VS:  BP 135/64   Pulse 74   Ht 5' (1.524 m)   Wt 123 lb 3.2 oz (55.9 kg)   SpO2 99%   BMI 24.06 kg/m , BMI Body mass index is 24.06 kg/m.  Wt Readings from Last 3 Encounters:  05/19/16 123 lb 3.2 oz (55.9 kg)  05/19/16 121 lb 3.2 oz (55 kg)  05/12/16 119 lb 9.6 oz (54.3 kg)    General: Elderly woman in no distress, uses a cane. HEENT: Conjunctiva and lids normal, oropharynx clear. Neck: Supple, no elevated JVP or carotid bruits, no thyromegaly. Lungs: Clear to auscultation, nonlabored breathing at rest. Cardiac: Regular rate and rhythm, no S3, soft systolic murmur, no pericardial rub. Abdomen: Soft, nontender, bowel sounds present, no guarding or rebound. Extremities: No pitting edema, distal pulses 2+. Skin: Warm and dry. Musculoskeletal: No kyphosis. Neuropsychiatric: Alert and oriented x3, affect grossly appropriate.  ECG: I personally reviewed the tracing from 01/24/2016 which showed sinus rhythm with left bundle branch block.  Recent Labwork: 11/05/2015: TSH 5.209 05/19/2016: ALT 13; AST 22; BUN 21; Creatinine, Ser 0.89; Hemoglobin 8.7; Magnesium 1.7; Platelets 170; Potassium 4.3; Sodium 138     Component Value Date/Time   CHOL 181 10/26/2015 1007   TRIG 113 10/26/2015 1007   HDL 63 10/26/2015 1007   CHOLHDL 2.9 10/26/2015 1007   VLDL 23 10/26/2015 1007   LDLCALC 95 10/26/2015 1007    Other Studies Reviewed Today:  Adenosine Myoview 06/03/2012: Impression Exercise Capacity:  Adenosine study with no exercise. BP Response:  Normal blood pressure response. Clinical Symptoms:  Chest tightness, headache.  ECG Impression:  Baseline:  LBBB.  EKG uninterpretable due to LBBB at rest and stress. Comparison with Prior Nuclear Study: No significant change from previous study  Overall Impression:   Normal stress nuclear study.  LV Ejection Fraction: 69%.  LV Wall Motion:  NL LV Function; NL Wall Motion  Assessment and Plan:  1. Palpitations and history of PSVT. She reports relatively brief episodes that have not progressed in  frequency or intensity. For now would continue current dose of Coreg, next step would be to up titrate dose to 25 mg twice daily.  2. Chronic left bundle-branch block. Most recent ECG reviewed.  3. Prior history of chest pain with reassuring cardiac workup including documentation of normal coronaries as of 2002 and negative noninvasive workup in 2014.  4. Hyperlipidemia, currently on omega-3 supplements. She follows with Dr. Quintin Alto. Triglycerides 113 in September of last year.  Current medicines were reviewed with the patient today.  Disposition: Follow-up in 6 months.  Signed, Satira Sark, MD, [facility] 05/19/2016 4:32 PM    [facility] [address], [place], [place] [address] Phone: [phone]; Fax: [phone]

## 2016-05-19 NOTE — Patient Instructions (Signed)

## 2016-05-20 LAB — CEA: CEA: 585.4 ng/mL — AB (ref 0.0–4.7)

## 2016-05-26 ENCOUNTER — Encounter (HOSPITAL_COMMUNITY): Payer: Self-pay

## 2016-05-26 ENCOUNTER — Encounter (HOSPITAL_BASED_OUTPATIENT_CLINIC_OR_DEPARTMENT_OTHER): Payer: Medicare Other

## 2016-05-26 VITALS — BP 100/44 | HR 65 | Temp 97.8°F | Resp 18 | Wt 118.0 lb

## 2016-05-26 DIAGNOSIS — C787 Secondary malignant neoplasm of liver and intrahepatic bile duct: Secondary | ICD-10-CM

## 2016-05-26 DIAGNOSIS — C189 Malignant neoplasm of colon, unspecified: Secondary | ICD-10-CM | POA: Diagnosis not present

## 2016-05-26 DIAGNOSIS — Z5111 Encounter for antineoplastic chemotherapy: Secondary | ICD-10-CM | POA: Diagnosis not present

## 2016-05-26 DIAGNOSIS — D509 Iron deficiency anemia, unspecified: Secondary | ICD-10-CM | POA: Diagnosis not present

## 2016-05-26 LAB — CBC WITH DIFFERENTIAL/PLATELET
BASOS PCT: 0 %
Basophils Absolute: 0 10*3/uL (ref 0.0–0.1)
EOS ABS: 0 10*3/uL (ref 0.0–0.7)
EOS PCT: 1 %
HCT: 27.5 % — ABNORMAL LOW (ref 36.0–46.0)
HEMOGLOBIN: 8.9 g/dL — AB (ref 12.0–15.0)
LYMPHS ABS: 0.8 10*3/uL (ref 0.7–4.0)
Lymphocytes Relative: 21 %
MCH: 29.4 pg (ref 26.0–34.0)
MCHC: 32.4 g/dL (ref 30.0–36.0)
MCV: 90.8 fL (ref 78.0–100.0)
MONO ABS: 0.4 10*3/uL (ref 0.1–1.0)
Monocytes Relative: 11 %
NEUTROS PCT: 67 %
Neutro Abs: 2.6 10*3/uL (ref 1.7–7.7)
Platelets: 174 10*3/uL (ref 150–400)
RBC: 3.03 MIL/uL — ABNORMAL LOW (ref 3.87–5.11)
RDW: 15.4 % (ref 11.5–15.5)
WBC: 3.8 10*3/uL — ABNORMAL LOW (ref 4.0–10.5)

## 2016-05-26 LAB — COMPREHENSIVE METABOLIC PANEL
ALBUMIN: 3.6 g/dL (ref 3.5–5.0)
ALK PHOS: 79 U/L (ref 38–126)
ALT: 16 U/L (ref 14–54)
ANION GAP: 7 (ref 5–15)
AST: 25 U/L (ref 15–41)
BUN: 23 mg/dL — AB (ref 6–20)
CALCIUM: 9.3 mg/dL (ref 8.9–10.3)
CO2: 17 mmol/L — AB (ref 22–32)
CREATININE: 1.13 mg/dL — AB (ref 0.44–1.00)
Chloride: 113 mmol/L — ABNORMAL HIGH (ref 101–111)
GFR calc non Af Amer: 43 mL/min — ABNORMAL LOW (ref >60)
GFR, EST AFRICAN AMERICAN: 50 mL/min — AB (ref >60)
GLUCOSE: 127 mg/dL — AB (ref 65–99)
POTASSIUM: 4.1 mmol/L (ref 3.5–5.1)
SODIUM: 137 mmol/L (ref 135–145)
Total Bilirubin: 0.4 mg/dL (ref 0.3–1.2)
Total Protein: 6.8 g/dL (ref 6.5–8.1)

## 2016-05-26 LAB — MAGNESIUM: MAGNESIUM: 1.8 mg/dL (ref 1.7–2.4)

## 2016-05-26 MED ORDER — FENTANYL 25 MCG/HR TD PT72: 25.0000 ug | MEDICATED_PATCH | TRANSDERMAL | 0 refills | Status: AC

## 2016-05-26 MED ORDER — HEPARIN SOD (PORK) LOCK FLUSH 100 UNIT/ML IV SOLN
500.0000 [IU] | Freq: Once | INTRAVENOUS | Status: AC | PRN
  Administered 2016-05-26: 500 [IU]
  Filled 2016-05-26: qty 5

## 2016-05-26 MED ORDER — DEXAMETHASONE SODIUM PHOSPHATE 10 MG/ML IJ SOLN
10.0000 mg | Freq: Once | INTRAMUSCULAR | Status: AC
Start: 2016-05-26 — End: 2016-05-26
  Administered 2016-05-26: 10 mg via INTRAVENOUS
  Filled 2016-05-26: qty 1

## 2016-05-26 MED ORDER — ATROPINE SULFATE 1 MG/ML IJ SOLN
0.5000 mg | Freq: Once | INTRAMUSCULAR | Status: AC | PRN
  Administered 2016-05-26: 0.5 mg via INTRAVENOUS
  Filled 2016-05-26: qty 1

## 2016-05-26 MED ORDER — DEXTROSE 5 % IV SOLN
65.0000 mg/m2 | Freq: Once | INTRAVENOUS | Status: AC
  Administered 2016-05-26: 100 mg via INTRAVENOUS
  Filled 2016-05-26: qty 5

## 2016-05-26 MED ORDER — HYDROCODONE-ACETAMINOPHEN 10-325 MG PO TABS: 1.0000 | ORAL_TABLET | ORAL | 0 refills | Status: DC | PRN

## 2016-05-26 MED ORDER — SODIUM CHLORIDE 0.9 % IV SOLN
Freq: Once | INTRAVENOUS | Status: AC
  Administered 2016-05-26: 13:00:00 via INTRAVENOUS

## 2016-05-26 MED ORDER — PALONOSETRON HCL INJECTION 0.25 MG/5ML
0.2500 mg | Freq: Once | INTRAVENOUS | Status: AC
  Administered 2016-05-26: 0.25 mg via INTRAVENOUS
  Filled 2016-05-26: qty 5

## 2016-05-26 NOTE — Progress Notes (Signed)
Tolerated infusion w/o adverse reaction.  Alert, in no distress.  VSS.  Discharged via wheelchair in c/o family.  

## 2016-05-26 NOTE — Patient Instructions (Addendum)
[facility] Discharge Instructions for Patients Receiving Chemotherapy   Beginning January 23rd 2017 lab work for the Cancer Center will be done in the  Main lab at [place] on 1st floor. If you have a lab appointment with the Cancer Center please come in thru the  Main Entrance and check in at the main information desk   Today you received the following chemotherapy agents:  irinotecan  If you develop nausea and vomiting, or diarrhea that is not controlled by your medication, call the clinic.  The clinic phone number is [phone]. Office hours are Monday-Friday 8:30am-5:00pm.  BELOW ARE SYMPTOMS THAT SHOULD BE REPORTED IMMEDIATELY:  *FEVER GREATER THAN 101.0 F  *CHILLS WITH OR WITHOUT FEVER  NAUSEA AND VOMITING THAT IS NOT CONTROLLED WITH YOUR NAUSEA MEDICATION  *UNUSUAL SHORTNESS OF BREATH  *UNUSUAL BRUISING OR BLEEDING  TENDERNESS IN MOUTH AND THROAT WITH OR WITHOUT PRESENCE OF ULCERS  *URINARY PROBLEMS  *BOWEL PROBLEMS  UNUSUAL RASH Items with * indicate a potential emergency and should be followed up as soon as possible. If you have an emergency after office hours please contact your primary care physician or go to the nearest emergency department.  Please call the clinic during office hours if you have any questions or concerns.   You may also contact the Patient Navigator at [phone] should you have any questions or need assistance in obtaining follow up care.      Resources For Cancer Patients and their Caregivers ? American Cancer Society: Can assist with transportation, wigs, general needs, runs Look Good Feel Better.        1-888-227-6333 ? Cancer Care: Provides financial assistance, online support groups, medication/co-pay assistance.  1-800-813-HOPE (4673) ? [facility] Assists [place] cancer patients and their families through emotional , educational and financial support.   336-427-4357 ? [place] DSS Where to apply for food stamps, Medicaid and utility assistance. 336-342-1394 ? RC[employer]: Transportation to medical appointments. 336-347-2287 ? Social Security Administration: May apply for disability if have a Stage IV cancer. [phone] 1-800-772-1213 ? [place] Aging, Disability and Transit Services: Assists with nutrition, care and transit needs. [phone]         

## 2016-05-28 ENCOUNTER — Telehealth (HOSPITAL_COMMUNITY): Payer: Self-pay

## 2016-05-28 ENCOUNTER — Encounter (HOSPITAL_COMMUNITY): Payer: Medicare Other

## 2016-05-28 DIAGNOSIS — C189 Malignant neoplasm of colon, unspecified: Secondary | ICD-10-CM

## 2016-05-28 DIAGNOSIS — D509 Iron deficiency anemia, unspecified: Secondary | ICD-10-CM | POA: Diagnosis not present

## 2016-05-28 DIAGNOSIS — C787 Secondary malignant neoplasm of liver and intrahepatic bile duct: Principal | ICD-10-CM

## 2016-05-28 LAB — CBC WITH DIFFERENTIAL/PLATELET
BASOS PCT: 0 %
Basophils Absolute: 0 10*3/uL (ref 0.0–0.1)
Eosinophils Absolute: 0 10*3/uL (ref 0.0–0.7)
Eosinophils Relative: 0 %
HEMATOCRIT: 27.7 % — AB (ref 36.0–46.0)
Hemoglobin: 9 g/dL — ABNORMAL LOW (ref 12.0–15.0)
LYMPHS ABS: 0.6 10*3/uL — AB (ref 0.7–4.0)
Lymphocytes Relative: 25 %
MCH: 29.3 pg (ref 26.0–34.0)
MCHC: 32.5 g/dL (ref 30.0–36.0)
MCV: 90.2 fL (ref 78.0–100.0)
MONO ABS: 0.2 10*3/uL (ref 0.1–1.0)
MONOS PCT: 8 %
NEUTROS ABS: 1.7 10*3/uL (ref 1.7–7.7)
Neutrophils Relative %: 67 %
Platelets: 195 10*3/uL (ref 150–400)
RBC: 3.07 MIL/uL — ABNORMAL LOW (ref 3.87–5.11)
RDW: 15.7 % — ABNORMAL HIGH (ref 11.5–15.5)
WBC: 2.6 10*3/uL — ABNORMAL LOW (ref 4.0–10.5)

## 2016-05-28 LAB — IRON AND TIBC
Iron: 143 ug/dL (ref 28–170)
Saturation Ratios: 48 % — ABNORMAL HIGH (ref 10.4–31.8)
TIBC: 295 ug/dL (ref 250–450)
UIBC: 152 ug/dL

## 2016-05-28 LAB — SAMPLE TO BLOOD BANK

## 2016-05-28 NOTE — Telephone Encounter (Signed)
Patient called stating she was weak and had been passing blood in her colostomy bag. She states she has noticed blood in her colostomy every time she empties it. Reviewed with PA-C, labs ordered. Called patient back. Appointment made for this afternoon for labs.

## 2016-06-02 ENCOUNTER — Other Ambulatory Visit: Payer: Self-pay | Admitting: Cardiology

## 2016-06-03 ENCOUNTER — Ambulatory Visit (HOSPITAL_COMMUNITY)
Admission: RE | Admit: 2016-06-03 | Discharge: 2016-06-03 | Disposition: A | Discharge status: Home / Self Care | Payer: Medicare Other | Source: Ambulatory Visit | Attending: Oncology | Admitting: Oncology

## 2016-06-03 DIAGNOSIS — C787 Secondary malignant neoplasm of liver and intrahepatic bile duct: Secondary | ICD-10-CM | POA: Diagnosis not present

## 2016-06-03 DIAGNOSIS — I700 Atherosclerosis of aorta: Secondary | ICD-10-CM | POA: Insufficient documentation

## 2016-06-03 DIAGNOSIS — C189 Malignant neoplasm of colon, unspecified: Secondary | ICD-10-CM | POA: Diagnosis not present

## 2016-06-03 MED ORDER — IOPAMIDOL (ISOVUE-300) INJECTION 61%
80.0000 mL | Freq: Once | INTRAVENOUS | Status: AC | PRN
Start: 2016-06-03 — End: 2016-06-03
  Administered 2016-06-03: 80 mL via INTRAVENOUS

## 2016-06-12 ENCOUNTER — Other Ambulatory Visit (HOSPITAL_COMMUNITY): Payer: Self-pay | Admitting: Oncology

## 2016-06-13 ENCOUNTER — Inpatient Hospital Stay (HOSPITAL_COMMUNITY)
Admission: AD | Admit: 2016-06-13 | Discharge: 2016-06-16 | DRG: 683 | Disposition: A | Discharge status: Home Health | Payer: Medicare Other | Source: Ambulatory Visit | Attending: Internal Medicine | Admitting: Internal Medicine

## 2016-06-13 ENCOUNTER — Encounter (HOSPITAL_COMMUNITY): Payer: Self-pay | Admitting: Adult Health

## 2016-06-13 ENCOUNTER — Encounter (HOSPITAL_COMMUNITY): Payer: Medicare Other | Attending: Hematology and Oncology

## 2016-06-13 ENCOUNTER — Ambulatory Visit (HOSPITAL_COMMUNITY): Payer: Medicare Other

## 2016-06-13 ENCOUNTER — Inpatient Hospital Stay (HOSPITAL_COMMUNITY): Payer: Medicare Other

## 2016-06-13 ENCOUNTER — Ambulatory Visit (HOSPITAL_COMMUNITY): Payer: Medicare Other | Admitting: Adult Health

## 2016-06-13 ENCOUNTER — Encounter (HOSPITAL_COMMUNITY): Payer: Medicare Other | Attending: Adult Health | Admitting: Adult Health

## 2016-06-13 VITALS — BP 99/45 | HR 78 | Temp 99.4°F | Resp 16 | Wt 114.8 lb

## 2016-06-13 DIAGNOSIS — Z885 Allergy status to narcotic agent status: Secondary | ICD-10-CM

## 2016-06-13 DIAGNOSIS — R1310 Dysphagia, unspecified: Secondary | ICD-10-CM | POA: Diagnosis not present

## 2016-06-13 DIAGNOSIS — D500 Iron deficiency anemia secondary to blood loss (chronic): Secondary | ICD-10-CM | POA: Diagnosis present

## 2016-06-13 DIAGNOSIS — N179 Acute kidney failure, unspecified: Secondary | ICD-10-CM

## 2016-06-13 DIAGNOSIS — K220 Achalasia of cardia: Secondary | ICD-10-CM | POA: Diagnosis present

## 2016-06-13 DIAGNOSIS — C189 Malignant neoplasm of colon, unspecified: Secondary | ICD-10-CM | POA: Insufficient documentation

## 2016-06-13 DIAGNOSIS — B370 Candidal stomatitis: Secondary | ICD-10-CM | POA: Diagnosis not present

## 2016-06-13 DIAGNOSIS — Z9049 Acquired absence of other specified parts of digestive tract: Secondary | ICD-10-CM

## 2016-06-13 DIAGNOSIS — L8990 Pressure ulcer of unspecified site, unspecified stage: Secondary | ICD-10-CM | POA: Insufficient documentation

## 2016-06-13 DIAGNOSIS — I959 Hypotension, unspecified: Secondary | ICD-10-CM | POA: Diagnosis not present

## 2016-06-13 DIAGNOSIS — Z8551 Personal history of malignant neoplasm of bladder: Secondary | ICD-10-CM

## 2016-06-13 DIAGNOSIS — Z515 Encounter for palliative care: Secondary | ICD-10-CM | POA: Diagnosis not present

## 2016-06-13 DIAGNOSIS — Z86718 Personal history of other venous thrombosis and embolism: Secondary | ICD-10-CM | POA: Diagnosis not present

## 2016-06-13 DIAGNOSIS — R300 Dysuria: Secondary | ICD-10-CM | POA: Diagnosis not present

## 2016-06-13 DIAGNOSIS — Z86010 Personal history of colonic polyps: Secondary | ICD-10-CM | POA: Diagnosis not present

## 2016-06-13 DIAGNOSIS — R109 Unspecified abdominal pain: Secondary | ICD-10-CM | POA: Diagnosis not present

## 2016-06-13 DIAGNOSIS — F329 Major depressive disorder, single episode, unspecified: Secondary | ICD-10-CM | POA: Diagnosis present

## 2016-06-13 DIAGNOSIS — I10 Essential (primary) hypertension: Secondary | ICD-10-CM | POA: Diagnosis present

## 2016-06-13 DIAGNOSIS — Z79891 Long term (current) use of opiate analgesic: Secondary | ICD-10-CM

## 2016-06-13 DIAGNOSIS — Z87891 Personal history of nicotine dependence: Secondary | ICD-10-CM | POA: Diagnosis not present

## 2016-06-13 DIAGNOSIS — M797 Fibromyalgia: Secondary | ICD-10-CM | POA: Diagnosis present

## 2016-06-13 DIAGNOSIS — Z800 Family history of malignant neoplasm of digestive organs: Secondary | ICD-10-CM | POA: Diagnosis not present

## 2016-06-13 DIAGNOSIS — R638 Other symptoms and signs concerning food and fluid intake: Secondary | ICD-10-CM

## 2016-06-13 DIAGNOSIS — E860 Dehydration: Secondary | ICD-10-CM

## 2016-06-13 DIAGNOSIS — Z803 Family history of malignant neoplasm of breast: Secondary | ICD-10-CM

## 2016-06-13 DIAGNOSIS — R509 Fever, unspecified: Secondary | ICD-10-CM | POA: Diagnosis not present

## 2016-06-13 DIAGNOSIS — Z806 Family history of leukemia: Secondary | ICD-10-CM

## 2016-06-13 DIAGNOSIS — Z932 Ileostomy status: Secondary | ICD-10-CM | POA: Diagnosis not present

## 2016-06-13 DIAGNOSIS — B3781 Candidal esophagitis: Secondary | ICD-10-CM | POA: Diagnosis present

## 2016-06-13 DIAGNOSIS — I447 Left bundle-branch block, unspecified: Secondary | ICD-10-CM | POA: Diagnosis present

## 2016-06-13 DIAGNOSIS — Z8052 Family history of malignant neoplasm of bladder: Secondary | ICD-10-CM

## 2016-06-13 DIAGNOSIS — Z888 Allergy status to other drugs, medicaments and biological substances status: Secondary | ICD-10-CM

## 2016-06-13 DIAGNOSIS — Z7982 Long term (current) use of aspirin: Secondary | ICD-10-CM

## 2016-06-13 DIAGNOSIS — D649 Anemia, unspecified: Secondary | ICD-10-CM | POA: Diagnosis not present

## 2016-06-13 DIAGNOSIS — Z881 Allergy status to other antibiotic agents status: Secondary | ICD-10-CM

## 2016-06-13 DIAGNOSIS — H9190 Unspecified hearing loss, unspecified ear: Secondary | ICD-10-CM | POA: Diagnosis present

## 2016-06-13 DIAGNOSIS — R1011 Right upper quadrant pain: Secondary | ICD-10-CM

## 2016-06-13 DIAGNOSIS — C787 Secondary malignant neoplasm of liver and intrahepatic bile duct: Secondary | ICD-10-CM

## 2016-06-13 DIAGNOSIS — R197 Diarrhea, unspecified: Secondary | ICD-10-CM | POA: Diagnosis not present

## 2016-06-13 DIAGNOSIS — Z85038 Personal history of other malignant neoplasm of large intestine: Secondary | ICD-10-CM

## 2016-06-13 DIAGNOSIS — R5383 Other fatigue: Secondary | ICD-10-CM | POA: Diagnosis not present

## 2016-06-13 DIAGNOSIS — E039 Hypothyroidism, unspecified: Secondary | ICD-10-CM | POA: Diagnosis present

## 2016-06-13 DIAGNOSIS — D509 Iron deficiency anemia, unspecified: Secondary | ICD-10-CM | POA: Insufficient documentation

## 2016-06-13 DIAGNOSIS — E119 Type 2 diabetes mellitus without complications: Secondary | ICD-10-CM

## 2016-06-13 DIAGNOSIS — K219 Gastro-esophageal reflux disease without esophagitis: Secondary | ICD-10-CM | POA: Diagnosis present

## 2016-06-13 DIAGNOSIS — R5081 Fever presenting with conditions classified elsewhere: Secondary | ICD-10-CM | POA: Diagnosis not present

## 2016-06-13 DIAGNOSIS — E785 Hyperlipidemia, unspecified: Secondary | ICD-10-CM | POA: Diagnosis present

## 2016-06-13 DIAGNOSIS — M1990 Unspecified osteoarthritis, unspecified site: Secondary | ICD-10-CM | POA: Diagnosis present

## 2016-06-13 DIAGNOSIS — Z7189 Other specified counseling: Secondary | ICD-10-CM | POA: Diagnosis not present

## 2016-06-13 DIAGNOSIS — Z79899 Other long term (current) drug therapy: Secondary | ICD-10-CM

## 2016-06-13 DIAGNOSIS — Z882 Allergy status to sulfonamides status: Secondary | ICD-10-CM

## 2016-06-13 LAB — URINALYSIS, ROUTINE W REFLEX MICROSCOPIC
Bilirubin Urine: NEGATIVE
Glucose, UA: NEGATIVE mg/dL
Hgb urine dipstick: NEGATIVE
Ketones, ur: NEGATIVE mg/dL
Leukocytes, UA: NEGATIVE
Nitrite: NEGATIVE
Protein, ur: 30 mg/dL — AB
Specific Gravity, Urine: 1.016 (ref 1.005–1.030)
pH: 5 (ref 5.0–8.0)

## 2016-06-13 LAB — COMPREHENSIVE METABOLIC PANEL
ALBUMIN: 2.7 g/dL — AB (ref 3.5–5.0)
ALK PHOS: 113 U/L (ref 38–126)
ALT: 16 U/L (ref 14–54)
ANION GAP: 9 (ref 5–15)
AST: 21 U/L (ref 15–41)
BILIRUBIN TOTAL: 0.2 mg/dL — AB (ref 0.3–1.2)
BUN: 61 mg/dL — AB (ref 6–20)
CALCIUM: 8.8 mg/dL — AB (ref 8.9–10.3)
CO2: 15 mmol/L — ABNORMAL LOW (ref 22–32)
CREATININE: 2.27 mg/dL — AB (ref 0.44–1.00)
Chloride: 109 mmol/L (ref 101–111)
GFR calc Af Amer: 22 mL/min — ABNORMAL LOW (ref >60)
GFR calc non Af Amer: 19 mL/min — ABNORMAL LOW (ref >60)
GLUCOSE: 151 mg/dL — AB (ref 65–99)
Potassium: 4.9 mmol/L (ref 3.5–5.1)
Sodium: 133 mmol/L — ABNORMAL LOW (ref 135–145)
TOTAL PROTEIN: 6.6 g/dL (ref 6.5–8.1)

## 2016-06-13 LAB — CBC WITH DIFFERENTIAL/PLATELET
BASOS ABS: 0 10*3/uL (ref 0.0–0.1)
BASOS PCT: 0 %
BASOS PCT: 0 %
Basophils Absolute: 0 10*3/uL (ref 0.0–0.1)
EOS ABS: 0 10*3/uL (ref 0.0–0.7)
Eosinophils Absolute: 0 10*3/uL (ref 0.0–0.7)
Eosinophils Relative: 0 %
Eosinophils Relative: 0 %
HEMATOCRIT: 24.5 % — AB (ref 36.0–46.0)
HEMATOCRIT: 24.3 % — AB (ref 36.0–46.0)
HEMOGLOBIN: 8.1 g/dL — AB (ref 12.0–15.0)
HEMOGLOBIN: 8 g/dL — AB (ref 12.0–15.0)
Lymphocytes Relative: 5 %
Lymphocytes Relative: 8 %
Lymphs Abs: 0.4 10*3/uL — ABNORMAL LOW (ref 0.7–4.0)
Lymphs Abs: 0.6 10*3/uL — ABNORMAL LOW (ref 0.7–4.0)
MCH: 28.6 pg (ref 26.0–34.0)
MCH: 28.8 pg (ref 26.0–34.0)
MCHC: 33.1 g/dL (ref 30.0–36.0)
MCHC: 32.9 g/dL (ref 30.0–36.0)
MCV: 86.6 fL (ref 78.0–100.0)
MCV: 87.4 fL (ref 78.0–100.0)
MONOS PCT: 22 %
MONOS PCT: 18 %
Monocytes Absolute: 1.8 10*3/uL — ABNORMAL HIGH (ref 0.1–1.0)
Monocytes Absolute: 1.3 10*3/uL — ABNORMAL HIGH (ref 0.1–1.0)
NEUTROS ABS: 5.8 10*3/uL (ref 1.7–7.7)
NEUTROS PCT: 73 %
NEUTROS PCT: 74 %
Neutro Abs: 5.2 10*3/uL (ref 1.7–7.7)
Platelets: 213 10*3/uL (ref 150–400)
Platelets: 230 10*3/uL (ref 150–400)
RBC: 2.83 MIL/uL — ABNORMAL LOW (ref 3.87–5.11)
RBC: 2.78 MIL/uL — ABNORMAL LOW (ref 3.87–5.11)
RDW: 17.3 % — ABNORMAL HIGH (ref 11.5–15.5)
RDW: 17.5 % — ABNORMAL HIGH (ref 11.5–15.5)
WBC: 7.9 10*3/uL (ref 4.0–10.5)
WBC: 7.1 10*3/uL (ref 4.0–10.5)

## 2016-06-13 LAB — BASIC METABOLIC PANEL
ANION GAP: 6 (ref 5–15)
BUN: 54 mg/dL — ABNORMAL HIGH (ref 6–20)
CO2: 16 mmol/L — ABNORMAL LOW (ref 22–32)
Calcium: 8.3 mg/dL — ABNORMAL LOW (ref 8.9–10.3)
Chloride: 113 mmol/L — ABNORMAL HIGH (ref 101–111)
Creatinine, Ser: 1.83 mg/dL — ABNORMAL HIGH (ref 0.44–1.00)
GFR, EST AFRICAN AMERICAN: 28 mL/min — AB (ref >60)
GFR, EST NON AFRICAN AMERICAN: 24 mL/min — AB (ref >60)
GLUCOSE: 111 mg/dL — AB (ref 65–99)
Potassium: 4.3 mmol/L (ref 3.5–5.1)
SODIUM: 135 mmol/L (ref 135–145)

## 2016-06-13 LAB — GLUCOSE, CAPILLARY
GLUCOSE-CAPILLARY: 106 mg/dL — AB (ref 65–99)
GLUCOSE-CAPILLARY: 153 mg/dL — AB (ref 65–99)

## 2016-06-13 LAB — MAGNESIUM
MAGNESIUM: 2 mg/dL (ref 1.7–2.4)
Magnesium: 2.1 mg/dL (ref 1.7–2.4)

## 2016-06-13 LAB — PHOSPHORUS
PHOSPHORUS: 4.2 mg/dL (ref 2.5–4.6)
Phosphorus: 4.4 mg/dL (ref 2.5–4.6)

## 2016-06-13 LAB — LACTIC ACID, PLASMA
LACTIC ACID, VENOUS: 0.7 mmol/L (ref 0.5–1.9)
LACTIC ACID, VENOUS: 1.1 mmol/L (ref 0.5–1.9)

## 2016-06-13 MED ORDER — MAGIC MOUTHWASH W/LIDOCAINE: 5.0000 mL | Freq: Four times a day (QID) | ORAL | Status: DC | PRN

## 2016-06-13 MED ORDER — ONDANSETRON HCL 4 MG PO TABS: 4.0000 mg | ORAL_TABLET | Freq: Four times a day (QID) | ORAL | Status: DC | PRN

## 2016-06-13 MED ORDER — PANTOPRAZOLE SODIUM 40 MG PO TBEC
40.0000 mg | DELAYED_RELEASE_TABLET | Freq: Every day | ORAL | Status: DC
  Administered 2016-06-14 – 2016-06-16 (×3): 40 mg via ORAL
  Filled 2016-06-13 (×3): qty 1

## 2016-06-13 MED ORDER — LEVOTHYROXINE SODIUM 75 MCG PO TABS
75.0000 ug | ORAL_TABLET | Freq: Every day | ORAL | Status: DC
  Administered 2016-06-14 – 2016-06-16 (×3): 75 ug via ORAL
  Filled 2016-06-13 (×3): qty 1

## 2016-06-13 MED ORDER — SODIUM CHLORIDE 0.9 % IV SOLN
INTRAVENOUS | Status: DC
  Administered 2016-06-13: 10:00:00 via INTRAVENOUS

## 2016-06-13 MED ORDER — SODIUM CHLORIDE 0.9 % IV BOLUS (SEPSIS)
1000.0000 mL | Freq: Once | INTRAVENOUS | Status: AC
  Administered 2016-06-13: 1000 mL via INTRAVENOUS

## 2016-06-13 MED ORDER — ONDANSETRON HCL 4 MG/2ML IJ SOLN: 4.0000 mg | Freq: Four times a day (QID) | INTRAMUSCULAR | Status: DC | PRN

## 2016-06-13 MED ORDER — FLUCONAZOLE 100 MG PO TABS
100.0000 mg | ORAL_TABLET | Freq: Every day | ORAL | Status: DC
  Administered 2016-06-13 – 2016-06-14 (×2): 100 mg via ORAL
  Filled 2016-06-13 (×2): qty 1

## 2016-06-13 MED ORDER — DEXTROSE 5 % IV SOLN
INTRAVENOUS | Status: AC
  Filled 2016-06-13: qty 1

## 2016-06-13 MED ORDER — HEPARIN SOD (PORK) LOCK FLUSH 100 UNIT/ML IV SOLN
INTRAVENOUS | Status: AC
  Filled 2016-06-13: qty 5

## 2016-06-13 MED ORDER — INSULIN ASPART 100 UNIT/ML ~~LOC~~ SOLN
0.0000 [IU] | Freq: Three times a day (TID) | SUBCUTANEOUS | Status: DC
  Administered 2016-06-14 – 2016-06-15 (×2): 1 [IU] via SUBCUTANEOUS

## 2016-06-13 MED ORDER — NYSTATIN 100000 UNIT/ML MT SUSP
5.0000 mL | Freq: Four times a day (QID) | OROMUCOSAL | Status: DC
  Administered 2016-06-13 – 2016-06-16 (×9): 500000 [IU] via ORAL
  Filled 2016-06-13 (×9): qty 5

## 2016-06-13 MED ORDER — ASPIRIN EC 325 MG PO TBEC
325.0000 mg | DELAYED_RELEASE_TABLET | Freq: Every day | ORAL | Status: DC
  Administered 2016-06-14 – 2016-06-16 (×3): 325 mg via ORAL
  Filled 2016-06-13 (×3): qty 1

## 2016-06-13 MED ORDER — HYOSCYAMINE SULFATE 0.125 MG SL SUBL: 0.1250 mg | SUBLINGUAL_TABLET | Freq: Three times a day (TID) | SUBLINGUAL | Status: DC | PRN

## 2016-06-13 MED ORDER — SALINE SPRAY 0.65 % NA SOLN
1.0000 | NASAL | Status: DC | PRN
Start: 2016-06-13 — End: 2016-06-16

## 2016-06-13 MED ORDER — MAGIC MOUTHWASH: 5.0000 mL | Freq: Four times a day (QID) | ORAL | Status: DC | PRN

## 2016-06-13 MED ORDER — SODIUM CHLORIDE 0.9 % IV SOLN
INTRAVENOUS | Status: AC
  Administered 2016-06-13: 18:00:00 via INTRAVENOUS

## 2016-06-13 MED ORDER — FLUCONAZOLE 100 MG PO TABS: 100.0000 mg | ORAL_TABLET | Freq: Every day | ORAL | 0 refills | Status: DC

## 2016-06-13 MED ORDER — NITROGLYCERIN 0.4 MG SL SUBL: 0.4000 mg | SUBLINGUAL_TABLET | SUBLINGUAL | Status: DC | PRN

## 2016-06-13 MED ORDER — MAGNESIUM OXIDE 400 (241.3 MG) MG PO TABS
400.0000 mg | ORAL_TABLET | Freq: Three times a day (TID) | ORAL | Status: DC
  Administered 2016-06-13 – 2016-06-16 (×8): 400 mg via ORAL
  Filled 2016-06-13 (×8): qty 1

## 2016-06-13 MED ORDER — ALPRAZOLAM 0.5 MG PO TABS
0.5000 mg | ORAL_TABLET | Freq: Every day | ORAL | Status: DC
  Administered 2016-06-13 – 2016-06-15 (×3): 0.5 mg via ORAL
  Filled 2016-06-13 (×3): qty 1

## 2016-06-13 MED ORDER — FENTANYL 25 MCG/HR TD PT72
25.0000 ug | MEDICATED_PATCH | TRANSDERMAL | Status: DC
  Filled 2016-06-13 (×2): qty 1

## 2016-06-13 MED ORDER — DICYCLOMINE HCL 10 MG PO CAPS: 10.0000 mg | ORAL_CAPSULE | Freq: Four times a day (QID) | ORAL | Status: DC | PRN

## 2016-06-13 MED ORDER — DIPHENOXYLATE-ATROPINE 2.5-0.025 MG PO TABS: 1.0000 | ORAL_TABLET | Freq: Four times a day (QID) | ORAL | Status: DC | PRN

## 2016-06-13 MED ORDER — DEXTROSE 5 % IV SOLN
1.0000 g | INTRAVENOUS | Status: DC
  Administered 2016-06-13: 1 g via INTRAVENOUS
  Filled 2016-06-13 (×3): qty 1

## 2016-06-13 MED ORDER — HYDROCODONE-ACETAMINOPHEN 10-325 MG PO TABS
1.0000 | ORAL_TABLET | Freq: Four times a day (QID) | ORAL | Status: DC | PRN
  Administered 2016-06-13 – 2016-06-16 (×4): 1 via ORAL
  Filled 2016-06-13 (×4): qty 1

## 2016-06-13 NOTE — Patient Instructions (Addendum)
[place] at [facility] Discharge Instructions  RECOMMENDATIONS MADE BY THE CONSULTANT AND ANY TEST RESULTS WILL BE SENT TO YOUR REFERRING PHYSICIAN.  You were seen today by Mike Craze, NP You will not be receiving chemotherapy treatment today but you will get fluids Stool cultures have been sent to the lab, we will call you with results   Thank you for choosing [place] at [facility] to provide your oncology and hematology care.  To afford each patient quality time with our provider, please arrive at least 15 minutes before your scheduled appointment time.    If you have a lab appointment with the [facility] please come in thru the  Main Entrance and check in at the main information desk  You need to re-schedule your appointment should you arrive 10 or more minutes late.  We strive to give you quality time with our providers, and arriving late affects you and other patients whose appointments are after yours.  Also, if you no show three or more times for appointments you may be dismissed from the clinic at the providers discretion.     Again, thank you for choosing [facility].  Our hope is that these requests will decrease the amount of time that you wait before being seen by our physicians.       _____________________________________________________________  Should you have questions after your visit to [facility], please contact our office at [phone] between the hours of 8:30 a.m. and 4:30 p.m.  Voicemails left after 4:30 p.m. will not be returned until the following business day.  For prescription refill requests, have your pharmacy contact our office.       Resources For Cancer Patients and their Caregivers ? American Cancer Society: Can assist with transportation, wigs, general needs, runs Look Good Feel Better.        901-848-2754 ? Cancer Care: Provides financial assistance, online  support groups, medication/co-pay assistance.  1-800-813-HOPE 6360501439) ? [place] Assists [place] cancer patients and their families through emotional , educational and financial support.  ([phone] ? [place] DSS Where to apply for food stamps, Medicaid and utility assistance. 8583505734 ? RCATS: Transportation to medical appointments. (579) 859-1662 ? Social Security Administration: May apply for disability if have a Stage IV cancer. 872-584-2910 7057484782 ? [employer], Disability and Transit Services: Assists with nutrition, care and transit needs. [facility] Support Programs: @10RELATIVEDAYS @ > Cancer Support Group  2nd Tuesday of the month 1pm-2pm, [name]  > Creative Journey  3rd Tuesday of the month 1130am-1pm, [name]  > Look Good Feel Better  1st Wednesday of the month 10am-12 noon, [name] (Call Lane to register [phone])

## 2016-06-13 NOTE — Progress Notes (Signed)
Palliative Medicine consult noted. There may be a delay seeing this patient, as a PMT provider will return to [facility] on Monday, May 7. The patient will be seen at that time if she remains inpatient. Please call the Palliative Medicine Team office at [phone] if recommendations are needed in the interim.  Thank you for inviting [place] to see this patient.  Marjie Skiff Rudra Hobbins, RN, BSN, [facility] 06/13/2016 4:16 PM Cell [phone] 8:00-4:00 Monday-Friday Office [phone]

## 2016-06-13 NOTE — Progress Notes (Signed)
Patient received one liter of IVF per Mike Craze NP.   Urine tests, and stool test sent to lab.  Report given to Janina Mayo RN on unit 300, will transport patient via wheelchair, and port a cath left accessed per [place] NP in stable condition with daughter at side. Follow up as scheduled.

## 2016-06-13 NOTE — Progress Notes (Signed)
IVF rate decreased to 200 mL/hr about 1 hour into infusion d/t patient's history of left bundle branch block. (order initially written for 333 mL/hr).   ---  Addendum: I checked on patient at approximately 1:30 pm.  She has only voided <50 mL and has received about 800 mL of IVF thus far.  Overall, she does not feel much improved.  I have been unable to send urine sample for urinalysis/urine culture d/t decreased output.  Discussed with patient and her daughter my recommendation for her to be admitted to the hospital for continued close monitoring.  They agreed with this plan.  Will discuss with hospitalist team for consideration for direct admission.   -Reasons for admission: Recent fevers to 101 while receiving chemotherapy, hypotension (BP 90s/50s), dehydration with decreased urine output and elevated creatinine to 2.27.  Her WBCs are currently normal, but may be falsely normal in setting of chemotherapy (I.e. Difficult to assess leukocytosis in a patient receiving chemotherapy).

## 2016-06-13 NOTE — H&P (Signed)
History and Physical    Karen [name] DVV:[id number] DOB: 1929/02/20 DOA: 06/13/2016  PCP: [name], MD   Patient coming from: [place].  I have personally briefly reviewed patient's old medical records in [place]  Chief Complaint: Fever and weakness.  HPI: Karen [name] is a 83 y.o. female with medical history significant of asthma, abdominal lesions, achalasia, anemia, osteoarthritis, stage III chronic kidney disease, depression, DVT, essential hypertension, GERD, fibromyalgia, hyperlipidemia, hypothyroidism, iron deficiency anemia due to chronic blood loss, LBBB, history of colon cancer and ileostomy who is being referred from the cancer center for further evaluation and treatment of fever and progressive weakness.  Per patient's daughter, the patient has not been feeling well since last weekend. She had a low-grade temperature on Sunday associated with chills. She has also been complaining of decreased appetite, RUQ pain associated with nausea and increased output from ileostomy for several days. Per daughter, there has not been melenic stools or blood in the ileostomy bag. She denies chest pain, palpitations, dyspnea, diaphoresis, PND, orthopnea or pitting edema of the lower extremities. She denies dysuria, frequency or hematuria. Her daughter also mentions that the patient has shown moments of confusion at times and has been sleeping more than usual.  ED Course: Not applied.  Review of Systems: As per HPI otherwise 10 point review of systems negative.    Past Medical History:  Diagnosis Date  . Abdominal adhesions   . Achalasia    a. s/p botox injection for achalasia.  . Anemia   . Arthritis   . Blood transfusion   . Breast lump   . Chest pain    a. 2002 Cath: nl cors;  b. 2009 aden mv: nl;  c. 05/2009 Echo: nl. d. 2014: normal nuclear stress test, EF 69%.  . CKD (chronic kidney disease), stage III   . Colon cancer ([place])    a. recurrence 2016 with liver  mets -  percutaneous liver biopsy and thermal ablation of a liver metastasis by interventional radiology..  . Colon polyp   . Depression   . DVT of deep femoral vein ([place])   . Essential hypertension   . Fibromyalgia   . GERD (gastroesophageal reflux disease)   . Headache   . Hearing loss   . Hernia   . History of asthma   . History of bladder cancer   . History of PSVT (paroxysmal supraventricular tachycardia)   . Hyperlipidemia   . Hypothyroidism   . Ileostomy present ([place])   . Iron deficiency anemia due to chronic blood loss   . LBBB (left bundle branch block)   . Obstruction of intestine or colon ([place]) 06/2014  . Sinus bradycardia    a. HR 30s in 08/2014 requiring holding of digoxin.  . Type 2 diabetes mellitus ([place])   . Vision abnormalities   . Wears dentures     Past Surgical History:  Procedure Laterality Date  . ABDOMINAL HYSTERECTOMY    . APPENDECTOMY    . BOTOX INJECTION N/A 09/01/2013   Procedure: BOTOX INJECTION;  Surgeon: Rogene Houston, MD;  Location: [facility];  Service: Endoscopy;  Laterality: N/A;  . BREAST SURGERY     right breast biopsy  . CARDIAC CATHETERIZATION  12/10/2000   THE LEFT VENTRICLE IS MILDY DILATED. THERE IS MILD TO MODERATE DIFFUSE HYPOKINESIS WITH EF 35%  . CATARACT EXTRACTION W/PHACO  11/13/2011   Procedure: CATARACT EXTRACTION PHACO AND INTRAOCULAR LENS PLACEMENT (IOC);  Surgeon: Tonny Branch, MD;  Location: [facility] ORS;  Service: Ophthalmology;  Laterality: Right;  CDE 12.26  . CATARACT EXTRACTION W/PHACO  12/08/2011   Procedure: CATARACT EXTRACTION PHACO AND INTRAOCULAR LENS PLACEMENT (IOC);  Surgeon: Tonny Branch, MD;  Location: [facility] OR[facility];  Service: Ophthalmology;  Laterality: Left;  CDE 15.11  . CHOLECYSTECTOMY    . COLON CANCER SURGERY    . COLON SURGERY    . COLONOSCOPY  09/26/2011   Procedure: COLONOSCOPY;  Surgeon: Rogene Houston, MD;  Location: [facility];  Service: Endoscopy;  Laterality: N/A;  215  . CORONARY ANGIOPLASTY    .  ESOPHAGEAL DILATION N/A 01/18/2016   Procedure: ESOPHAGEAL DILATION;  Surgeon: Rogene Houston, MD;  Location: [facility];  Service: Endoscopy;  Laterality: N/A;  . ESOPHAGOGASTRODUODENOSCOPY  02/13/2011   Procedure: ESOPHAGOGASTRODUODENOSCOPY (EGD);  Surgeon: Rogene Houston, MD;  Location: [facility];  Service: Endoscopy;  Laterality: N/A;  1030  . ESOPHAGOGASTRODUODENOSCOPY N/A 09/01/2013   Procedure: ESOPHAGOGASTRODUODENOSCOPY (EGD);  Surgeon: Rogene Houston, MD;  [facility];  Service: Endoscopy;  Laterality: N/A;  830  . ESOPHAGOGASTRODUODENOSCOPY N/A 01/18/2016   Procedure: ESOPHAGOGASTRODUODENOSCOPY (EGD);  Surgeon: Rogene Houston, MD;  [facility];  Service: Endoscopy;  Laterality: N/A;  1120  . EYE SURGERY  2014   catarac surgery  . ILEOSTOMY    . ILEOSTOMY    . JOINT REPLACEMENT Left 2008  . liver biopsy and ablation  09/01/14  . ovarian cystectomy 1955    . PORTACATH PLACEMENT Left 02/08/2016   Procedure: INSERTION PORT-A-CATH;  Surgeon: Aviva Signs, MD;  Location: [facility];  Service: General;  Laterality: Left;  . ROTATOR CUFF REPAIR    . TONSILLECTOMY       reports that she quit smoking about 53 years ago. Her smoking use included Cigarettes. She has a 3.25 pack-year smoking history. She has never used smokeless tobacco. She reports that she does not drink alcohol or use drugs.  Allergies  Allergen Reactions  . Bactrim [Sulfamethoxazole-Trimethoprim] Shortness Of Breath and Other (See Comments)    Hurting all over  . Codeine Palpitations and Other (See Comments)    Heart races  . Demerol Palpitations and Other (See Comments)    tachycardia  . Lidocaine Hcl Other (See Comments)    tachycardia  . Morphine And Related Palpitations    Tachycardia   . Dilaudid [Hydromorphone Hcl] Other (See Comments)    Hallucinations  . Nitrofurantoin Monohyd Macro Other (See Comments)    Unknown   . Lisinopril Cough  . Ramipril Cough    Family History    Problem Relation Age of Onset  . Heart disease Mother   . Stroke Mother     deceased  . Arthritis Mother   . Bladder Cancer Mother     dx in her 85s  . Colon cancer Mother   . Arthritis Father   . Heart disease Father     decesaed  . Leukemia Father   . Stroke Sister     alive/debilitated  . Hypertension Sister   . Other Sister     paralysis  . Heart disease Brother     bypass surgery  . Arthritis Brother   . Colon cancer Brother   . Bladder Cancer Brother   . Stroke Sister     alive/debilitated  . Diabetes Sister   . Other Brother     stomach problems  . Other Brother     bladder  . Colon cancer Paternal  Aunt   . Bladder Cancer Other     dx twice in her 30s-40s  . Breast cancer Other     great niece dx in her early 36s  . Prostate cancer Other     newphew  . Colon polyps Daughter   . Stroke    . Hypertension      Prior to Admission medications   Medication Sig Start Date End Date Taking? Authorizing Provider  ALPRAZolam Duanne Moron) 0.5 MG tablet Take 0.5 mg by mouth at bedtime.     Historical Provider, MD  aspirin EC 325 MG tablet Take 1 tablet (325 mg total) by mouth daily. 01/21/16   Rogene Houston, MD  carvedilol (COREG) 12.5 MG tablet TAKE ONE & ONE-HALF TABLETS BY MOUTH TWICE DAILY WITH  A  MEAL 06/03/16   Peter M Martinique, MD  dicyclomine (BENTYL) 10 MG capsule Take 1 capsule (10 mg total) by mouth 4 (four) times daily as needed for spasms. 05/12/16   Twana First, MD  diphenoxylate-atropine (LOMOTIL) 2.5-0.025 MG tablet as needed.  02/29/16   Historical Provider, MD  fentaNYL (DURAGESIC - DOSED MCG/HR) 25 MCG/HR patch Place 1 patch (25 mcg total) onto the skin every 3 (three) days. 05/26/16   Baird Cancer, PA-C  fluconazole (DIFLUCAN) 100 MG tablet Take 1 tablet (100 mg total) by mouth daily. 06/13/16   Holley Bouche, NP  HYDROcodone-acetaminophen [facility]) 10-325 MG tablet  06/10/16   Historical Provider, MD  hyoscyamine (LEVSIN SL) 0.125 MG SL tablet Place 1  tablet (0.125 mg total) under the tongue 3 (three) times daily as needed (lower abdominal pain). 09/18/15   Rogene Houston, MD  IRINOTECAN HCL IV Inject into the vein. Weekly    Historical Provider, MD  isosorbide mononitrate (IMDUR) 30 MG 24 hr tablet Take 15 mg by mouth daily. 10/31/15   Historical Provider, MD  levothyroxine (SYNTHROID, LEVOTHROID) 75 MCG tablet Take 75 mcg by mouth daily.     Historical Provider, MD  lidocaine-prilocaine (EMLA) cream Apply a quarter size amount to affected area 1 hour prior to coming to chemotherapy. 04/11/16   Holley Bouche, NP  magnesium oxide (MAG-OX) 400 (241.3 Mg) MG tablet Take 1 tablet (400 mg total) by mouth 3 (three) times daily. 04/11/16   Holley Bouche, NP  Multiple Vitamins-Minerals (CENTRUM WOMEN PO) Take 1 tablet by mouth daily.     Historical Provider, MD  NITROSTAT 0.4 MG SL tablet DISSOLVE ONE TABLET UNDER THE TONGUE EVERY 5 MINUTES AS NEEDED FOR CHEST PAIN.  DO NOT EXCEED A TOTAL OF 3 DOSES IN 15 MINUTES 11/15/13   Darlin Coco, MD  Omega-3 Fatty Acids (FISH OIL) 1000 MG CAPS Take 1 capsule by mouth daily. Reported on 02/19/2015    Historical Provider, MD  ondansetron (ZOFRAN-ODT) 8 MG disintegrating tablet Take 1 tablet (8 mg total) by mouth every 8 (eight) hours as needed for nausea or vomiting. 04/11/16   Holley Bouche, NP  pantoprazole (PROTONIX) 40 MG tablet Take 40 mg by mouth daily.     Historical Provider, MD    Physical Exam: Constitutional: Elderly, frail female. Vitals:   06/13/16 1606  BP: (!) 110/45  Pulse: 70  Resp: 18  Temp: 98.4 F (36.9 C)  TempSrc: Oral  SpO2: 100%  Weight: 52.5 kg (115 lb 11.2 oz)  Height: 5' (1.524 m)   Eyes: PERRL, lids and conjunctivae are pale. ENMT: Mucous membranes are mildly dry. Posterior pharynx shows erythema and patchy whitish exudate.  Dentures.  Neck: normal, supple, no masses, no thyromegaly Respiratory: clear to auscultation bilaterally, no wheezing, no crackles. Normal  respiratory effort. No accessory muscle use.  Cardiovascular: Regular rate and rhythm, no murmurs / rubs / gallops. No extremity edema. 2+ pedal pulses. No carotid bruits.  Abdomen: Ileostomy and ileostomy bag present. Bowel sounds positive. Soft, positive RUQ tenderness, no guarding/rebound/masses palpated. No hepatosplenomegaly.  Musculoskeletal: no clubbing / cyanosis.  Good ROM, no contractures. Normal muscle tone.  Skin: Positive ecchymosis right upper extremities Neurologic: CN 2-12 grossly intact. Sensation intact, DTR normal. Strength 5/5 in all 4.  Psychiatric: Normal judgment and insight. Alert and oriented x 3. Depressed mood.    Labs on Admission: I have personally reviewed following labs and imaging studies  CBC:  Recent Labs Lab 06/13/16 1011  WBC 7.9  NEUTROABS 5.8  HGB 8.1*  HCT 24.5*  MCV 86.6  PLT 528   Basic Metabolic Panel:  Recent Labs Lab 06/13/16 1011  NA 133*  K 4.9  CL 109  CO2 15*  GLUCOSE 151*  BUN 61*  CREATININE 2.27*  CALCIUM 8.8*  MG 2.1  PHOS 4.4   GFR: Estimated Creatinine Clearance: 13.3 mL/min (A) (by C-G formula based on SCr of 2.27 mg/dL (H)). Liver Function Tests:  Recent Labs Lab 06/13/16 1011  AST 21  ALT 16  ALKPHOS 113  BILITOT 0.2*  PROT 6.6  ALBUMIN 2.7*   No results for input(s): LIPASE, AMYLASE in the last 168 hours. No results for input(s): AMMONIA in the last 168 hours. Coagulation Profile: No results for input(s): INR, PROTIME in the last 168 hours. Cardiac Enzymes: No results for input(s): CKTOTAL, CKMB, CKMBINDEX, TROPONINI in the last 168 hours. BNP (last 3 results) No results for input(s): PROBNP in the last 8760 hours. HbA1C: No results for input(s): HGBA1C in the last 72 hours. CBG:  Recent Labs Lab 06/13/16 1635  GLUCAP 106*   Lipid Profile: No results for input(s): CHOL, HDL, LDLCALC, TRIG, CHOLHDL, LDLDIRECT in the last 72 hours. Thyroid Function Tests: No results for input(s): TSH,  T4TOTAL, FREET4, T3FREE, THYROIDAB in the last 72 hours. Anemia Panel: No results for input(s): VITAMINB12, FOLATE, FERRITIN, TIBC, IRON, RETICCTPCT in the last 72 hours. Urine analysis:    Component Value Date/Time   COLORURINE YELLOW 06/13/2016 1404   APPEARANCEUR HAZY (A) 06/13/2016 1404   LABSPEC 1.016 06/13/2016 1404   PHURINE 5.0 06/13/2016 1404   GLUCOSEU NEGATIVE 06/13/2016 1404   HGBUR NEGATIVE 06/13/2016 1404   BILIRUBINUR NEGATIVE 06/13/2016 1404   KETONESUR NEGATIVE 06/13/2016 1404   PROTEINUR 30 (A) 06/13/2016 1404   UROBILINOGEN 0.2 06/15/2014 0800   NITRITE NEGATIVE 06/13/2016 1404   LEUKOCYTESUR NEGATIVE 06/13/2016 1404    Radiological Exams on Admission:  CLINICAL DATA:  83 y/o F; colon cancer patient on chemotherapy presenting with fevers and weakness.  EXAM: CHEST  2 VIEW  COMPARISON:  02/08/2016 chest radiograph. 03/28/2016 CT of the chest.  FINDINGS: Port catheter with tip projecting over mid SVC is stable. Pulmonary nodule is better characterized on prior CT. Stable cardiac silhouette given projection and technique. No consolidation, effusion, or pneumothorax is identified. Multilevel degenerative changes of the spine.  IMPRESSION: No acute pulmonary process identified.   Electronically Signed   By: Kristine Garbe M.D.   On: 06/13/2016 19:15  Assessment/Plan Principal Problem:   AKI (acute kidney injury) ([place]) Admit to telemetry/inpatient. Continue IV fluids. Monitor electrolytes, BUN and creatinine. Consider nephrology evaluation if no improvement.  Active Problems:  Fever No clear source at this time. Cefepime 1 g IVPB every 12 hours. On Diflucan for esophageal candidiasis. Follow-up blood cultures and sensitivity.    Colon cancer metastasized to liver [facility]) The patient is not achieving goals of treatments with current chemotherapy. Consult palliative care medicine in the morning.    Dysphagia Secondary to oral  thrush. Resume fluconazole 100 mg by mouth daily. Nystatin 100,000 units swish and swallow 4-5 times a day. Magic mouthwash as needed for discomfort.    Hypothyroid Continue levothyroxine 75 g by mouth daily. Monitor TSH periodically.    Hypotension Hold carvedilol. Continue IV fluids.    HTN (hypertension) Hold carvedilol. Monitor blood pressure. Resume beta blocker once hypotension has resolved.    GERD (gastroesophageal reflux disease) Protonix 40 mg by mouth daily.    Type 2 diabetes mellitus (HCC) Carbohydrate modified diet. CBG monitoring with regular insulin sliding scale while in the hospital.    DVT prophylaxis: SCDs. Code Status: Full code. Family Communication: Her daughter was present in the room. Disposition Plan: Admit for further evaluation and treatment. Consults called: Palliative care routine consult a.m. Admission status: Inpatient/telemetry.   Reubin Milan MD [facility] Pager [phone].  If 7PM-7AM, please contact night-coverage [email or web address] Password [email or web address]  06/13/2016, 5:50 PM

## 2016-06-13 NOTE — Patient Instructions (Addendum)
Karen [name] at [facility] Discharge Instructions  RECOMMENDATIONS MADE BY THE CONSULTANT AND ANY TEST RESULTS WILL BE SENT TO YOUR REFERRING PHYSICIAN.  One liter of IVF given today Transferred to the unit 300 for acute kidney injury. Follow up as scheduled.  Thank you for choosing [place] [facility] to provide your oncology and hematology care.  To afford each patient quality time with our provider, please arrive at least 15 minutes before your scheduled appointment time.    If you have a lab appointment with the [facility] please come in thru the  Main Entrance and check in at the main information desk  You need to re-schedule your appointment should you arrive 10 or more minutes late.  We strive to give you quality time with our providers, and arriving late affects you and other patients whose appointments are after yours.  Also, if you no show three or more times for appointments you may be dismissed from the clinic at the providers discretion.     Again, thank you for choosing [facility].  Our hope is that these requests will decrease the amount of time that you wait before being seen by our physicians.       _____________________________________________________________  Should you have questions after your visit to [facility], please contact our office at [phone] between the hours of 8:30 a.m. and 4:30 p.m.  Voicemails left after 4:30 p.m. will not be returned until the following business day.  For prescription refill requests, have your pharmacy contact our office.       Resources For Cancer Patients and their Caregivers ? American Cancer Society: Can assist with transportation, wigs, general needs, runs Look Good Feel Better.        417-534-0604 ? Cancer Care: Provides financial assistance, online support groups, medication/co-pay assistance.  1-800-813-HOPE 4792291615) ? [place] Assists [place] cancer patients and their families through emotional , educational and financial support.  936-630-6374 ? [place] DSS Where to apply for food stamps, Medicaid and utility assistance. 603-858-3135 ? [facility]: Transportation to medical appointments. (712)442-4291 ? Social Security Administration: May apply for disability if have a Stage IV cancer. 623-679-9095 (754) 378-1493 ? [employer], Disability and Transit Services: Assists with nutrition, care and transit needs. [place] Support Programs: @10RELATIVEDAYS @ > Cancer Support Group  2nd Tuesday of the month 1pm-2pm, [name]  > Creative Journey  3rd Tuesday of the month 1130am-1pm, [name]  > Look Good Feel Better  1st Wednesday of the month 10am-12 noon, [name] (Call [place] to register [phone])

## 2016-06-13 NOTE — Progress Notes (Addendum)
[address], [place] [address]    CLINIC:  Medical Oncology/Hematology  PCP:  [name], MD [place] [address] [phone]   REASON FOR VISIT:  Follow-up for Stage IV metastatic colon cancer with liver mets   CURRENT THERAPY: Single-agent Irinotecan, beginning on 02/08/16   BRIEF ONCOLOGIC HISTORY:    Colon cancer metastasized to liver [facility])   04/12/1987 Definitive Surgery    Dr. Lindalou Hose at [facility]      09/01/2014 Pathology Results    Liver, needle/core biopsy - METASTATIC ADENOCARCINOMA.      09/01/2014 Miscellaneous    MWA left liver lesion, [name] (IR)      01/18/2015 Progression    PET scan      01/18/2015 PET scan    Recurrence of  tumor within segment 2 of the liver. There are 2 new lesions identified within segment 5 and segment 6 of the liver worrisome for additional sites of metastasis. Evidence of tumor recurrence at the intra colonic anastomosis is noted...      01/28/2015 - 04/16/2015 Chemotherapy    Xeloda 1500 mg BID 7 days on and 7 days off beginning in December 2016.      04/12/2015 Imaging    CT C/A/P progression of disease, enlargement of metastatic lesion in segments 2/3 of liver, development of mass with colon at junction of descending colon and sigmoid, indeterminate lesion in lower pole of the left kidney      04/20/2015 - 01/07/2016 Antibody Plan    Vectibix every 2 weeks, single-agent.      06/12/2015 - 06/13/2015 Hospital Admission    Hypotension, diarrhea, ARF, UTI      06/15/2015 Adverse Reaction    Vectibix rash      06/15/2015 Treatment Plan Change    Vectibix reduced to 5 mg/m2      07/13/2015 Treatment Plan Change    Treatment deferred due to hypomagnesemia and palpitations.  Escorted to ED.      09/24/2015 Imaging    CT abd/pelvis-  Liver lesions as follows:  Segment 2 left liver lobe hypodense 3.5 x 2.2 cm liver mass, previously 3.1 x 2.0 cm, mildly increased.  Medial segment 6 right liver lobe 1.7 x 0.9 cm hypodense liver mass, new. Peripheral segment 6 right liver lobe 1.0 x 0.7 cm hypodense liver mass, new. Serosal 0.7 cm segment 5 right liver lobe mass, previously 0.6 cm, not appreciably changed. Hypodense 0.6 cm liver lesion adjacent to the IVC, previously 0.6 cm, unchanged.      09/24/2015 Imaging    CT chest- New 3 mm right upper lobe pulmonary nodule, indeterminate, pulmonary metastasis not excluded.      11/04/2015 Imaging    CT abd/pelvis- Mechanical small-bowel obstruction at the level of the mid small bowel, probably due to adhesions. No free air. No fluid collections. 2. Liver metastases appear stable. No new or progressive metastatic disease in the abdomen or pelvis.      01/09/2016 Imaging    CT CAP- Interval increase in size and prominence of RIGHT upper lobe pulmonary nodule measuring 4 mm is highly concerning for pulmonary metastasis. New enhancing lesion in the LEFT lateral hepatic lobe consistent with progression of hepatic metastasis. Stable mass along the descending colon was hypermetabolic on comparison PET-CT scan.      01/09/2016 Progression    CT imaging demonstrates new hepatic lobe lesion      02/08/2016 -  Chemotherapy  The patient had palonosetron (ALOXI) injection 0.25 mg, 0.25 mg, Intravenous,  Once, 1 of 4 cycles  irinotecan (CAMPTOSAR) 100 mg in dextrose 5 % 500 mL chemo infusion, 65 mg/m2 = 100 mg (100 % of original dose 65 mg/m2), Intravenous,  Once, 1 of 4 cycles Dose modification: 100 mg/m2 (original dose 65 mg/m2, Cycle 1, Reason: Provider Judgment), 75 mg/m2 (original dose 65 mg/m2, Cycle 1, Reason: Provider Judgment), 65 mg/m2 (original dose 65 mg/m2, Cycle 1, Reason: Provider Judgment), 75 mg/m2 (original dose 65 mg/m2, Cycle 1, Reason: Provider Judgment)  for chemotherapy treatment.        03/28/2016 Imaging    CT angio chest- No definite evidence of pulmonary embolus.  7 mm right upper lobe  pulmonary nodule is noted consistent with metastatic lesion.  Probable 6 cm left hepatic metastatic lesion is noted as well.      06/03/2016 Imaging    CT abd/pelvis- 1. Continued interval progression of liver metastases with enlargement of previous existing lesions and interval development of a new lesion. 2. Stable mass lesion in the descending colon segment of the Hartmann's pouch in this patient with right lower quadrant ileostomy. 3. Abdominal aortic atherosclerosis.      06/03/2016 Progression    CT scan demonstrates progression of disease with new liver lesion        INTERVAL HISTORY:  Karen [name] 83 y.o. female presents to cancer center for follow-up and consideration for next cycle of chemotherapy.   She is here today with her daughter.  They both tell me that she has not been feeling well at all for the past week.  Reports fever of 101.3 at home on 06/09/16; did not seek medical attention at that time.  Also reportedly with chills. States that it has been very difficult for her to eat and drink due to mouth pain and sore throat. "Food just doesn't taste good and it burns when I eat and swallow."  Weight loss of ~9 lbs noted in the past 1 month.  She has had significant diarrhea noted from her ostomy, so much so that "the bag has burst a few times in the middle of the night."  Endorses generalized abdominal pain; Bentyl is only minimally effective. She takes Norco only very seldomly, which does provide relief.  States that she thinks there may have been some blood in her stool earlier this week, "but I don't think it was a lot."  Denies any cough, shortness of breath, or congestion.    Overall, she feels very weak and fatigued. Her daughter reports that she has noticed that Karen [name] seems a bit confused at times is concerned she may have a UTI.     REVIEW OF SYSTEMS:  Review of Systems  Constitutional: Positive for chills, fatigue and fever.  HENT:   Positive for sore  throat and trouble swallowing (pain/burning with swallowing ).   Eyes: Negative.   Respiratory: Negative.  Negative for cough and shortness of breath.   Cardiovascular: Negative.  Negative for chest pain.  Gastrointestinal: Positive for abdominal pain, blood in stool and diarrhea. Negative for constipation, nausea and vomiting.       GERD as well  Endocrine: Negative.   Genitourinary: Positive for dysuria. Negative for hematuria.   Musculoskeletal: Positive for arthralgias (hip pain and pain "all over" ).  Skin: Negative.   Neurological: Positive for dizziness and numbness (L) leg neuropathy.       Confusion per patient's daughter   Hematological: Bruises/bleeds easily.  Psychiatric/Behavioral: Negative.      PAST MEDICAL/SURGICAL HISTORY:  Past Medical History:  Diagnosis Date  . Abdominal adhesions   . Achalasia    a. s/p botox injection for achalasia.  . Anemia   . Arthritis   . Blood transfusion   . Breast lump   . Chest pain    a. 2002 Cath: nl cors;  b. 2009 aden mv: nl;  c. 05/2009 Echo: nl. d. 2014: normal nuclear stress test, EF 69%.  . CKD (chronic kidney disease), stage III   . Colon cancer ([place])    a. recurrence 2016 with liver mets -  percutaneous liver biopsy and thermal ablation of a liver metastasis by interventional radiology..  . Colon polyp   . Depression   . DVT of deep femoral vein ([place])   . Essential hypertension   . Fibromyalgia   . GERD (gastroesophageal reflux disease)   . Headache   . Hearing loss   . Hernia   . History of asthma   . History of bladder cancer   . History of PSVT (paroxysmal supraventricular tachycardia)   . Hyperlipidemia   . Hypothyroidism   . Ileostomy present ([place])   . Iron deficiency anemia due to chronic blood loss   . LBBB (left bundle branch block)   . Obstruction of intestine or colon ([place]) 06/2014  . Sinus bradycardia    a. HR 30s in 08/2014 requiring holding of digoxin.  . Type 2 diabetes mellitus ([place])   . Vision  abnormalities   . Wears dentures    Past Surgical History:  Procedure Laterality Date  . ABDOMINAL HYSTERECTOMY    . APPENDECTOMY    . BOTOX INJECTION N/A 09/01/2013   Procedure: BOTOX INJECTION;  Surgeon: Rogene Houston, MD;  Location: [facility];  Service: Endoscopy;  Laterality: N/A;  . BREAST SURGERY     right breast biopsy  . CARDIAC CATHETERIZATION  12/10/2000   THE LEFT VENTRICLE IS MILDY DILATED. THERE IS MILD TO MODERATE DIFFUSE HYPOKINESIS WITH EF 35%  . CATARACT EXTRACTION W/PHACO  11/13/2011   Procedure: CATARACT EXTRACTION PHACO AND INTRAOCULAR LENS PLACEMENT (IOC);  Surgeon: Tonny Branch, MD;  Location: [facility];  Service: Ophthalmology;  Laterality: Right;  CDE 12.26  . CATARACT EXTRACTION W/PHACO  12/08/2011   Procedure: CATARACT EXTRACTION PHACO AND INTRAOCULAR LENS PLACEMENT (IOC);  Surgeon: Tonny Branch, MD;  Location: [facility];  Service: Ophthalmology;  Laterality: Left;  CDE 15.11  . CHOLECYSTECTOMY    . COLON CANCER SURGERY    . COLON SURGERY    . COLONOSCOPY  09/26/2011   Procedure: COLONOSCOPY;  Surgeon: Rogene Houston, MD;  Location: [facility];  Service: Endoscopy;  Laterality: N/A;  215  . CORONARY ANGIOPLASTY    . ESOPHAGEAL DILATION N/A 01/18/2016   Procedure: ESOPHAGEAL DILATION;  Surgeon: Rogene Houston, MD;  Location: [facility];  Service: Endoscopy;  Laterality: N/A;  . ESOPHAGOGASTRODUODENOSCOPY  02/13/2011   Procedure: ESOPHAGOGASTRODUODENOSCOPY (EGD);  Surgeon: Rogene Houston, MD;  Location: [facility];  Service: Endoscopy;  Laterality: N/A;  1030  . ESOPHAGOGASTRODUODENOSCOPY N/A 09/01/2013   Procedure: ESOPHAGOGASTRODUODENOSCOPY (EGD);  Surgeon: Rogene Houston, MD;  [facility]: [facility];  Service: Endoscopy;  Laterality: N/A;  830  . ESOPHAGOGASTRODUODENOSCOPY N/A 01/18/2016   Procedure: ESOPHAGOGASTRODUODENOSCOPY (EGD);  Surgeon: Rogene Houston, MD;  Location: [facility];  Service: Endoscopy;  Laterality: N/A;  1120  . EYE SURGERY  2014    catarac surgery  .  ILEOSTOMY    . ILEOSTOMY    . JOINT REPLACEMENT Left 2008  . liver biopsy and ablation  09/01/14  . ovarian cystectomy 1955    . PORTACATH PLACEMENT Left 02/08/2016   Procedure: INSERTION PORT-A-CATH;  Surgeon: Aviva Signs, MD;  Location: [facility];  Service: General;  Laterality: Left;  . ROTATOR CUFF REPAIR    . TONSILLECTOMY       SOCIAL HISTORY:  Social History   Social History  . Marital status: Married    Spouse name: N/A  . Number of children: 4  . Years of education: N/A   Occupational History  . Not on file.   Social History Main Topics  . Smoking status: Former Smoker    Packs/day: 0.25    Years: 13.00    Types: Cigarettes    Quit date: 02/11/1963  . Smokeless tobacco: Never Used  . Alcohol use No  . Drug use: No  . Sexual activity: No   Other Topics Concern  . Not on file   Social History Narrative  . No narrative on file    FAMILY HISTORY:  Family History  Problem Relation Age of Onset  . Heart disease Mother   . Stroke Mother     deceased  . Arthritis Mother   . Bladder Cancer Mother     dx in her 47s  . Colon cancer Mother   . Arthritis Father   . Heart disease Father     decesaed  . Leukemia Father   . Stroke Sister     alive/debilitated  . Hypertension Sister   . Other Sister     paralysis  . Heart disease Brother     bypass surgery  . Arthritis Brother   . Colon cancer Brother   . Bladder Cancer Brother   . Stroke Sister     alive/debilitated  . Diabetes Sister   . Other Brother     stomach problems  . Other Brother     bladder  . Colon cancer Paternal Aunt   . Bladder Cancer Other     dx twice in her 30s-40s  . Breast cancer Other     great niece dx in her early 44s  . Prostate cancer Other     newphew  . Colon polyps Daughter   . Stroke    . Hypertension      CURRENT MEDICATIONS:  No facility-administered encounter medications on file as of 06/13/2016.    Outpatient Encounter Prescriptions as of  06/13/2016  Medication Sig  . ALPRAZolam (XANAX) 0.5 MG tablet Take 0.5 mg by mouth at bedtime.   Marland Kitchen aspirin EC 325 MG tablet Take 1 tablet (325 mg total) by mouth daily.  . carvedilol (COREG) 12.5 MG tablet TAKE ONE & ONE-HALF TABLETS BY MOUTH TWICE DAILY WITH  A  MEAL  . dicyclomine (BENTYL) 10 MG capsule Take 1 capsule (10 mg total) by mouth 4 (four) times daily as needed for spasms.  . diphenoxylate-atropine (LOMOTIL) 2.5-0.025 MG tablet as needed.   . fentaNYL (DURAGESIC - DOSED MCG/HR) 25 MCG/HR patch Place 1 patch (25 mcg total) onto the skin every 3 (three) days.  Marland Kitchen HYDROcodone-acetaminophen (NORCO) 10-325 MG tablet   . hyoscyamine (LEVSIN SL) 0.125 MG SL tablet Place 1 tablet (0.125 mg total) under the tongue 3 (three) times daily as needed (lower abdominal pain).  . IRINOTECAN HCL IV Inject into the vein. Weekly  . isosorbide mononitrate (IMDUR) 30 MG 24 hr tablet Take 15  mg by mouth daily.  Marland Kitchen levothyroxine (SYNTHROID, LEVOTHROID) 75 MCG tablet Take 75 mcg by mouth daily.   Marland Kitchen lidocaine-prilocaine (EMLA) cream Apply a quarter size amount to affected area 1 hour prior to coming to chemotherapy.  . magnesium oxide (MAG-OX) 400 (241.3 Mg) MG tablet Take 1 tablet (400 mg total) by mouth 3 (three) times daily.  . Multiple Vitamins-Minerals (CENTRUM WOMEN PO) Take 1 tablet by mouth daily.   Marland Kitchen NITROSTAT 0.4 MG SL tablet DISSOLVE ONE TABLET UNDER THE TONGUE EVERY 5 MINUTES AS NEEDED FOR CHEST PAIN.  DO NOT EXCEED A TOTAL OF 3 DOSES IN 15 MINUTES  . Omega-3 Fatty Acids (FISH OIL) 1000 MG CAPS Take 1 capsule by mouth daily. Reported on 02/19/2015  . ondansetron (ZOFRAN-ODT) 8 MG disintegrating tablet Take 1 tablet (8 mg total) by mouth every 8 (eight) hours as needed for nausea or vomiting.  . pantoprazole (PROTONIX) 40 MG tablet Take 40 mg by mouth daily.   . fluconazole (DIFLUCAN) 100 MG tablet Take 1 tablet (100 mg total) by mouth daily.  . [DISCONTINUED] HYDROcodone-acetaminophen (NORCO) 10-325 MG  tablet Take 1 tablet by mouth every 4 (four) hours as needed.    ALLERGIES:  Allergies  Allergen Reactions  . Bactrim [Sulfamethoxazole-Trimethoprim] Shortness Of Breath and Other (See Comments)    Hurting all over  . Codeine Palpitations and Other (See Comments)    Heart races  . Demerol Palpitations and Other (See Comments)    tachycardia  . Lidocaine Hcl Other (See Comments)    tachycardia  . Morphine And Related Palpitations    Tachycardia   . Dilaudid [Hydromorphone Hcl] Other (See Comments)    Hallucinations  . Nitrofurantoin Monohyd Macro Other (See Comments)    Unknown   . Lisinopril Cough  . Ramipril Cough     PHYSICAL EXAM:  ECOG Performance status: 2-3 - Symptomatic; patient is very weak, requires assistance    Vitals:   06/13/16 0901  BP: (!) 99/45  Pulse: 78  Resp: 16  Temp: 99.4 F (37.4 C)   Filed Weights   06/13/16 0901  Weight: 114 lb 12.8 oz (52.1 kg)    Physical Exam  Constitutional:  Frail, chronically-ill appearing female. Seen seated in wheelchair.   HENT:  Head: Normocephalic.  Tongue coated with patchy thrush. Posterior oropharynx erythematous.   Eyes: Conjunctivae are normal. Pupils are equal, round, and reactive to light. No scleral icterus.  Neck: Normal range of motion. Neck supple.  Cardiovascular: Normal rate and regular rhythm.   Pulmonary/Chest: Effort normal and breath sounds normal. No respiratory distress. She has no wheezes.  Abdominal: Soft. There is tenderness (Epigastric tenderness ).  Ostomy with greenish brown stool output; no evidence of bleeding in ostomy bag.   Lymphadenopathy:    She has no cervical adenopathy.  Skin: There is pallor.  Nursing note and vitals reviewed.    LABORATORY DATA:  I have reviewed the labs as listed.  CBC    Component Value Date/Time   WBC 7.9 06/13/2016 1011   RBC 2.83 (L) 06/13/2016 1011   HGB 8.1 (L) 06/13/2016 1011   HCT 24.5 (L) 06/13/2016 1011   PLT 213 06/13/2016 1011     MCV 86.6 06/13/2016 1011   MCH 28.6 06/13/2016 1011   MCHC 33.1 06/13/2016 1011   RDW 17.3 (H) 06/13/2016 1011   LYMPHSABS 0.4 (L) 06/13/2016 1011   MONOABS 1.8 (H) 06/13/2016 1011   EOSABS 0.0 06/13/2016 1011   BASOSABS 0.0 06/13/2016 1011  CMP Latest Ref Rng & Units 06/13/2016 05/26/2016 05/19/2016  Glucose 65 - 99 mg/dL 151(H) 127(H) 105(H)  BUN 6 - 20 mg/dL 61(H) 23(H) 21(H)  Creatinine 0.44 - 1.00 mg/dL 2.27(H) 1.13(H) 0.89  Sodium 135 - 145 mmol/L 133(L) 137 138  Potassium 3.5 - 5.1 mmol/L 4.9 4.1 4.3  Chloride 101 - 111 mmol/L 109 113(H) 109  CO2 22 - 32 mmol/L 15(L) 17(L) 22  Calcium 8.9 - 10.3 mg/dL 8.8(L) 9.3 9.1  Total Protein 6.5 - 8.1 g/dL 6.6 6.8 6.5  Total Bilirubin 0.3 - 1.2 mg/dL 0.2(L) 0.4 0.2(L)  Alkaline Phos 38 - 126 U/L 113 79 73  AST 15 - 41 U/L 21 25 22   ALT 14 - 54 U/L 16 16 13(L)    PENDING LABS:    DIAGNOSTIC IMAGING:  *I have reviewed the radiographic images/report and agree with the below findings.  CT abd/pelvis: 06/03/16       PATHOLOGY:       ASSESSMENT & PLAN:   Stage IV metastatic colon cancer with liver mets:  -Has had extensive previous treatment for metastatic colon cancer. Started single-agent salvage/palliative Irinotecan in 01/2016.  -Most recent restaging imaging with CT abd/pelvis on 06/03/16 revealed progressive liver lesions with interval development of new liver lesion and stable descending colon mass.  -Chemo held today d/t current condition with low-grade fever, hypotension, weakness/fatigue, dehydration.  -Given her extensive prior treatment and progression of disease on salvage Irinotecan, there are likely no other treatment options available for her. We briefly discussed this today, including a goals of care discussion.  -Will plan of admission to the hospital today. She'll return to cancer center next week for further discussions regarding plan of care.   Fever/Dysuria:  -Low-grade fever in clinic today at 99.4.  Afebrile after receiving IV fluids.  -UTI may be possible source of fever and reported intermittent altered mental status. She did not exhibit altered mental status in clinic today.  -Concern for sepsis given hypotension with BP 90s/40s. After IVF, BP did not significantly improve (100s/30s).   Acute kidney injury:  -BUN/Cre elevated to 61/2.27. Could be secondary to dehydration vs infection.  -Gentle IVF hydration with NS at 200 mL/hr for 1 liter given in clinic today.  Slower rate ordered given significant cardiac history with left bundle branch block.   Anemia:  -Could be secondary to GI bleed. No evidence of overt bleeding in ostomy bag on physical exam today. However, patient did report some blood in stool within the past week.   -Would consider transfusion as an inpatient, if clinically indicated.   Diarrhea/Abdominal pain:  -Could be secondary to Irinotecan chemotherapy vs infection. I have low clinical suspicion of GI infection given recent chemotherapy. However, given fever with diarrhea, will collect stool from ostomy.  -Stool cultures with GI panel + C.diff sent to lab for evaluation.   Oropharyngeal candidiasis:  -Likely source of oral and throat pain.  -E-scribed Diflucan 100 mg daily x 14 days to her pharmacy. Now that she is being admitted to the hospital, anti-fungal medications can be administered as inpatient.   Goals of care:  -We had brief discussions about her goals of care, including continuing chemotherapy.  I shared my concerns that given recent progression of disease noted on restaging scan and her current weak state, that we are likely nearing the end of possible treatment options for the cancer. We discussed stopping chemotherapy. She states that she prefers "if we do something instead of just stopping."  Of course, we will not give chemotherapy today and will plan admission to the hospital. I shared with her that we will have the hospitalist team consider Palliative  Care consult while inpatient. I explained the role of palliative care in helping control symptoms and establishing goals of care. She understands this plan. Her daughter states that she and her siblings have told their mother that "it is up to her what she does in terms of treatment and we support her with whatever she decides."   -Will have continued goals of care discussions as an outpatient as well next week.   *Discussed my clinical concerns for patient's current condition with the Ms. Manes and her daughter.  I recommended we admit her to the hospital here at [facility] for further evaluation. Direct admit procedure carried out by this provider and Dr. Olevia Bowens accepted admission to telemetry bed. Patient was transported from cancer center to [facility] bed assignment in stable condition.     Dispo:  -Direct admission to [facility] today for acute kidney injury and concern for sepsis.  -Return to cancer center next week; I will ask our schedulers to add an office visit with Dr. Talbert Cage for Friday, 06/20/16 at time of infusion.    All questions were answered to patient's stated satisfaction. Encouraged patient to call with any new concerns or questions before her next visit to the cancer center and we can certain see her sooner, if needed.       Orders placed this encounter:  Orders Placed This Encounter  Procedures  . Urine culture  . Gastrointestinal Panel by PCR , Stool  . C difficile quick screen w PCR reflex  . Urinalysis, Routine w reflex microscopic      Mike Craze, NP [place] [phone]

## 2016-06-13 NOTE — Progress Notes (Signed)
PHARMACY NOTE:  ANTIMICROBIAL RENAL DOSAGE ADJUSTMENT  Current antimicrobial regimen includes a mismatch between antimicrobial dosage and estimated renal function.  As per policy approved by the Pharmacy & Therapeutics and Medical Executive Committees, the antimicrobial dosage will be adjusted accordingly.  Current antimicrobial dosage:  Cefepime 1 gram IV q12h  Indication: Bacteremia Renal Function:  Estimated Creatinine Clearance: 13.3 mL/min (A) (by C-G formula based on SCr of 2.27 mg/dL (H)). []      On intermittent HD, scheduled: []      On CRRT    Antimicrobial dosage has been changed to:  Cefepime 1 gram IV q24h  Additional comments:   Thank you for allowing pharmacy to be a part of this patient's care.  Vernie Ammons, [facility] 06/13/2016 6:08 PM

## 2016-06-14 ENCOUNTER — Inpatient Hospital Stay (HOSPITAL_COMMUNITY): Payer: Medicare Other

## 2016-06-14 DIAGNOSIS — C189 Malignant neoplasm of colon, unspecified: Secondary | ICD-10-CM

## 2016-06-14 DIAGNOSIS — C787 Secondary malignant neoplasm of liver and intrahepatic bile duct: Secondary | ICD-10-CM

## 2016-06-14 DIAGNOSIS — R5081 Fever presenting with conditions classified elsewhere: Secondary | ICD-10-CM

## 2016-06-14 LAB — GASTROINTESTINAL PANEL BY PCR, STOOL (REPLACES STOOL CULTURE)

## 2016-06-14 LAB — CBC WITH DIFFERENTIAL/PLATELET
BASOS ABS: 0 10*3/uL (ref 0.0–0.1)
BASOS PCT: 0 %
EOS ABS: 0 10*3/uL (ref 0.0–0.7)
EOS PCT: 0 %
HCT: 23.7 % — ABNORMAL LOW (ref 36.0–46.0)
HEMOGLOBIN: 7.7 g/dL — AB (ref 12.0–15.0)
LYMPHS ABS: 0.7 10*3/uL (ref 0.7–4.0)
Lymphocytes Relative: 11 %
MCH: 28.2 pg (ref 26.0–34.0)
MCHC: 32.5 g/dL (ref 30.0–36.0)
MCV: 86.8 fL (ref 78.0–100.0)
Monocytes Absolute: 1.4 10*3/uL — ABNORMAL HIGH (ref 0.1–1.0)
Monocytes Relative: 21 %
NEUTROS PCT: 68 %
Neutro Abs: 4.3 10*3/uL (ref 1.7–7.7)
PLATELETS: 236 10*3/uL (ref 150–400)
RBC: 2.73 MIL/uL — AB (ref 3.87–5.11)
RDW: 17.6 % — ABNORMAL HIGH (ref 11.5–15.5)
WBC: 6.4 10*3/uL (ref 4.0–10.5)

## 2016-06-14 LAB — BASIC METABOLIC PANEL
ANION GAP: 9 (ref 5–15)
BUN: 37 mg/dL — ABNORMAL HIGH (ref 6–20)
CALCIUM: 8.4 mg/dL — AB (ref 8.9–10.3)
CHLORIDE: 114 mmol/L — AB (ref 101–111)
CO2: 16 mmol/L — ABNORMAL LOW (ref 22–32)
CREATININE: 1.26 mg/dL — AB (ref 0.44–1.00)
GFR, EST AFRICAN AMERICAN: 44 mL/min — AB (ref >60)
GFR, EST NON AFRICAN AMERICAN: 38 mL/min — AB (ref >60)
GLUCOSE: 98 mg/dL (ref 65–99)
POTASSIUM: 3.7 mmol/L (ref 3.5–5.1)
SODIUM: 139 mmol/L (ref 135–145)

## 2016-06-14 LAB — GLUCOSE, CAPILLARY
GLUCOSE-CAPILLARY: 123 mg/dL — AB (ref 65–99)
GLUCOSE-CAPILLARY: 89 mg/dL (ref 65–99)
Glucose-Capillary: 149 mg/dL — ABNORMAL HIGH (ref 65–99)
Glucose-Capillary: 106 mg/dL — ABNORMAL HIGH (ref 65–99)

## 2016-06-14 LAB — CEA: CEA: 615.6 ng/mL — AB (ref 0.0–4.7)

## 2016-06-14 LAB — TSH: TSH: 0.598 u[IU]/mL (ref 0.350–4.500)

## 2016-06-14 MED ORDER — SODIUM CHLORIDE 0.9 % IV SOLN
INTRAVENOUS | Status: DC
  Administered 2016-06-14 – 2016-06-16 (×4): via INTRAVENOUS

## 2016-06-14 MED ORDER — ACETAMINOPHEN 325 MG PO TABS
650.0000 mg | ORAL_TABLET | Freq: Four times a day (QID) | ORAL | Status: DC | PRN
  Administered 2016-06-14 – 2016-06-15 (×3): 650 mg via ORAL
  Filled 2016-06-14 (×3): qty 2

## 2016-06-14 MED ORDER — FLUCONAZOLE 100 MG PO TABS
100.0000 mg | ORAL_TABLET | Freq: Every day | ORAL | Status: DC
  Administered 2016-06-15 – 2016-06-16 (×2): 100 mg via ORAL
  Filled 2016-06-14 (×2): qty 1

## 2016-06-14 NOTE — Progress Notes (Signed)
PROGRESS NOTE    Karen [name]  WNI:[id number] DOB: [birth year] DOA: 06/13/2016 PCP: [name], MD     Brief Narrative:  83 y/o woman with h/o metastatic colon cancer s/p ileostomy here with fever and malaise and weakness.   Assessment & Plan:   Principal Problem:   AKI (acute kidney injury) ([place]) Active Problems:   Hypothyroid   Dysphagia   Colon cancer metastasized to liver (HCC)   Hypotension   GERD (gastroesophageal reflux disease)   HTN (hypertension)   Type 2 diabetes mellitus (HCC)   Pressure injury of skin   Fever   Fever/Generalized Weakness -No obvious source of infection, CXR/UA WNL cx are so far negative. -Will DC cefepime and observe off abx. -Her only complaint is RUQ pain: [place] without cholecystitis but she does have hepatic metastases that may account for her pain. -Check TSH/B12.  ARF -Improving with IVF. -cr down to 1.26 today from 2.27 on admission. -Due to prerenal azotemia and decreased PO intake.  Metastatic Colon Cancer -Given age,frailty and not achieving adequate responses with chemotherapy, will consult palliative care when available. Would benefit from hospice discussions with PCP and oncologist.  Hypothyroidism -Check TSH. -Continue home dose of synthroid.  Dysphagia -With thrush. -Continue diflucan for at least 7 days (may have candidal esophagitis given immunesuppresion on active chemo).  DM II -Fair control. -Continue current regimen.   DVT prophylaxis: SCDs Code Status: full code Family Communication: multiple family members at bedside updated on plan of care and questions answered Disposition Plan: [facility] home in 24-48 hours  Consultants:   None  Procedures:   None  Antimicrobials:  Anti-infectives    Start     Dose/Rate Route Frequency Ordered Stop   06/13/16 2000  ceFEPIme (MAXIPIME) 1 g in dextrose 5 % 50 mL IVPB  Status:  Discontinued     1 g 100 mL/hr over 30 Minutes Intravenous Every 24 hours  06/13/16 1752 06/14/16 1129   06/13/16 1800  fluconazole (DIFLUCAN) tablet 100 mg     100 mg Oral Daily 06/13/16 1752         Subjective: Fatigued, with RUQ abdominal pain  Objective: Vitals:   06/13/16 1606 06/13/16 2227 06/14/16 0426 06/14/16 1451  BP: (!) 110/45 (!) 110/37 (!) 124/42 (!) 143/52  Pulse: 70 79 81 68  Resp: 18 18 18 18   Temp: 98.4 F (36.9 C) 99.3 F (37.4 C) 100.1 F (37.8 C) 98.8 F (37.1 C)  TempSrc: Oral Oral Oral   SpO2: 100% 98% 98% 99%  Weight: 52.5 kg (115 lb 11.2 oz)     Height: 5' (1.524 m)       Intake/Output Summary (Last 24 hours) at 06/14/16 1610 Last data filed at 06/14/16 1300  Gross per 24 hour  Intake          1303.33 ml  Output                2 ml  Net          1301.33 ml   Filed Weights   06/13/16 1606  Weight: 52.5 kg (115 lb 11.2 oz)    Examination:  General exam: Alert, awake, oriented x 3 Respiratory system: Clear to auscultation. Respiratory effort normal. Cardiovascular system:RRR. No murmurs, rubs, gallops. Gastrointestinal system: Abdomen is nondistended, soft and nontender. No organomegaly or masses felt. Normal bowel sounds heard. Central nervous system: Alert and oriented. No focal neurological deficits. Extremities: No C/C/E, +pedal pulses Skin: No rashes, lesions  or ulcers Psychiatry: Judgement and insight appear normal. Mood & affect appropriate.     Data Reviewed: I have personally reviewed following labs and imaging studies  CBC:  Recent Labs Lab 06/13/16 1011 06/13/16 1732 06/14/16 0657  WBC 7.9 7.1 6.4  NEUTROABS 5.8 5.2 4.3  HGB 8.1* 8.0* 7.7*  HCT 24.5* 24.3* 23.7*  MCV 86.6 87.4 86.8  PLT 213 230 244   Basic Metabolic Panel:  Recent Labs Lab 06/13/16 1011 06/13/16 1732 06/14/16 0657  NA 133* 135 139  K 4.9 4.3 3.7  CL 109 113* 114*  CO2 15* 16* 16*  GLUCOSE 151* 111* 98  BUN 61* 54* 37*  CREATININE 2.27* 1.83* 1.26*  CALCIUM 8.8* 8.3* 8.4*  MG 2.1 2.0  --   PHOS 4.4 4.2  --      GFR: Estimated Creatinine Clearance: 23.9 mL/min (A) (by C-G formula based on SCr of 1.26 mg/dL (H)). Liver Function Tests:  Recent Labs Lab 06/13/16 1011  AST 21  ALT 16  ALKPHOS 113  BILITOT 0.2*  PROT 6.6  ALBUMIN 2.7*   No results for input(s): LIPASE, AMYLASE in the last 168 hours. No results for input(s): AMMONIA in the last 168 hours. Coagulation Profile: No results for input(s): INR, PROTIME in the last 168 hours. Cardiac Enzymes: No results for input(s): CKTOTAL, CKMB, CKMBINDEX, TROPONINI in the last 168 hours. BNP (last 3 results) No results for input(s): PROBNP in the last 8760 hours. HbA1C: No results for input(s): HGBA1C in the last 72 hours. CBG:  Recent Labs Lab 06/13/16 1635 06/13/16 2230 06/14/16 0859 06/14/16 1148  GLUCAP 106* 153* 149* 123*   Lipid Profile: No results for input(s): CHOL, HDL, LDLCALC, TRIG, CHOLHDL, LDLDIRECT in the last 72 hours. Thyroid Function Tests: No results for input(s): TSH, T4TOTAL, FREET4, T3FREE, THYROIDAB in the last 72 hours. Anemia Panel: No results for input(s): VITAMINB12, FOLATE, FERRITIN, TIBC, IRON, RETICCTPCT in the last 72 hours. Urine analysis:    Component Value Date/Time   COLORURINE YELLOW 06/13/2016 1404   APPEARANCEUR HAZY (A) 06/13/2016 1404   LABSPEC 1.016 06/13/2016 1404   PHURINE 5.0 06/13/2016 1404   GLUCOSEU NEGATIVE 06/13/2016 1404   HGBUR NEGATIVE 06/13/2016 1404   BILIRUBINUR NEGATIVE 06/13/2016 1404   KETONESUR NEGATIVE 06/13/2016 1404   PROTEINUR 30 (A) 06/13/2016 1404   UROBILINOGEN 0.2 06/15/2014 0800   NITRITE NEGATIVE 06/13/2016 1404   LEUKOCYTESUR NEGATIVE 06/13/2016 1404   Sepsis Labs: @LABRCNTIP (procalcitonin:4,lacticidven:4)  ) Recent Results (from the past 240 hour(s))  Gastrointestinal Panel by PCR , Stool     Status: None   Collection Time: 06/13/16  2:04 PM  Result Value Ref Range Status   Campylobacter species NOT DETECTED NOT DETECTED Final   Plesimonas  shigelloides NOT DETECTED NOT DETECTED Final   Salmonella species NOT DETECTED NOT DETECTED Final   Yersinia enterocolitica NOT DETECTED NOT DETECTED Final   Vibrio species NOT DETECTED NOT DETECTED Final   Vibrio cholerae NOT DETECTED NOT DETECTED Final   Enteroaggregative E coli (EAEC) NOT DETECTED NOT DETECTED Final   Enteropathogenic E coli (EPEC) NOT DETECTED NOT DETECTED Final   Enterotoxigenic E coli (ETEC) NOT DETECTED NOT DETECTED Final   Shiga like toxin producing E coli (STEC) NOT DETECTED NOT DETECTED Final   Shigella/Enteroinvasive E coli (EIEC) NOT DETECTED NOT DETECTED Final   Cryptosporidium NOT DETECTED NOT DETECTED Final   Cyclospora cayetanensis NOT DETECTED NOT DETECTED Final   Entamoeba histolytica NOT DETECTED NOT DETECTED Final   Giardia lamblia  NOT DETECTED NOT DETECTED Final   Adenovirus F40/41 NOT DETECTED NOT DETECTED Final   Astrovirus NOT DETECTED NOT DETECTED Final   Norovirus GI/GII NOT DETECTED NOT DETECTED Final   Rotavirus A NOT DETECTED NOT DETECTED Final   Sapovirus (I, II, IV, and V) NOT DETECTED NOT DETECTED Final  Culture, blood (routine x 2)     Status: None (Preliminary result)   Collection Time: 06/13/16  5:20 PM  Result Value Ref Range Status   Specimen Description PORTA CATH  Final   Special Requests   Final    BOTTLES DRAWN AEROBIC AND ANAEROBIC Blood Culture adequate volume   Culture NO GROWTH < 12 HOURS  Final   Report Status PENDING  Incomplete  Culture, blood (routine x 2)     Status: None (Preliminary result)   Collection Time: 06/13/16  5:32 PM  Result Value Ref Range Status   Specimen Description RIGHT ANTECUBITAL  Final   Special Requests   Final    BOTTLES DRAWN AEROBIC AND ANAEROBIC Blood Culture adequate volume   Culture NO GROWTH < 12 HOURS  Final   Report Status PENDING  Incomplete         Radiology Studies: Dg Chest 2 View  Result Date: 06/13/2016 CLINICAL DATA:  83 y/o F; colon cancer patient on chemotherapy  presenting with fevers and weakness. EXAM: CHEST  2 VIEW COMPARISON:  02/08/2016 chest radiograph. 03/28/2016 CT of the chest. FINDINGS: Port catheter with tip projecting over mid SVC is stable. Pulmonary nodule is better characterized on prior CT. Stable cardiac silhouette given projection and technique. No consolidation, effusion, or pneumothorax is identified. Multilevel degenerative changes of the spine. IMPRESSION: No acute pulmonary process identified. Electronically Signed   By: Kristine Garbe M.D.   On: 06/13/2016 19:15   US Abdomen Limited Ruq  Result Date: 06/14/2016 CLINICAL DATA:  Right upper quadrant abdominal pain. Personal history of colon cancer. EXAM: US ABDOMEN LIMITED - RIGHT UPPER QUADRANT COMPARISON:  CT scan of June 03, 2016. FINDINGS: Gallbladder: Status post cholecystectomy. Common bile duct: Diameter: 3 mm which is within normal limits. Liver: Two complex masses are noted consistent with metastatic disease. 2.7 cm mass is noted in right hepatic lobe, with 2.1 cm mass seen in left hepatic lobe. IMPRESSION: Status post cholecystectomy.  Hepatic metastatic lesions are noted. Electronically Signed   By: Marijo Conception, M.D.   On: 06/14/2016 13:43        Scheduled Meds: . ALPRAZolam  0.5 mg Oral QHS  . aspirin EC  325 mg Oral Daily  . fentaNYL  25 mcg Transdermal Q72H  . fluconazole  100 mg Oral Daily  . insulin aspart  0-9 Units Subcutaneous TID WC  . levothyroxine  75 mcg Oral QAC breakfast  . magnesium oxide  400 mg Oral TID  . nystatin  5 mL Oral QID  . pantoprazole  40 mg Oral Daily   Continuous Infusions: . sodium chloride 75 mL/hr at 06/14/16 1413     LOS: 1 day    Time spent: 25 minutes. Greater than 50% of this time was spent in direct contact with the patient coordinating care.     Lelon Frohlich, MD [facility] Pager [phone]  If 7PM-7AM, please contact night-coverage [email or web address] Password [email or web address] 06/14/2016, 4:10 PM

## 2016-06-15 DIAGNOSIS — R1310 Dysphagia, unspecified: Secondary | ICD-10-CM

## 2016-06-15 LAB — URINE CULTURE: CULTURE: NO GROWTH

## 2016-06-15 LAB — COMPREHENSIVE METABOLIC PANEL
ALBUMIN: 2 g/dL — AB (ref 3.5–5.0)
ALT: 12 U/L — ABNORMAL LOW (ref 14–54)
AST: 16 U/L (ref 15–41)
Alkaline Phosphatase: 103 U/L (ref 38–126)
Anion gap: 5 (ref 5–15)
BUN: 24 mg/dL — AB (ref 6–20)
CHLORIDE: 116 mmol/L — AB (ref 101–111)
CO2: 19 mmol/L — ABNORMAL LOW (ref 22–32)
Calcium: 8.4 mg/dL — ABNORMAL LOW (ref 8.9–10.3)
Creatinine, Ser: 0.91 mg/dL (ref 0.44–1.00)
GFR calc Af Amer: >60 mL/min (ref >60)
GFR calc non Af Amer: 56 mL/min — ABNORMAL LOW (ref >60)
GLUCOSE: 108 mg/dL — AB (ref 65–99)
POTASSIUM: 3.7 mmol/L (ref 3.5–5.1)
Sodium: 140 mmol/L (ref 135–145)
Total Bilirubin: 0.4 mg/dL (ref 0.3–1.2)
Total Protein: 5.6 g/dL — ABNORMAL LOW (ref 6.5–8.1)

## 2016-06-15 LAB — CBC
HCT: 24.3 % — ABNORMAL LOW (ref 36.0–46.0)
Hemoglobin: 8.1 g/dL — ABNORMAL LOW (ref 12.0–15.0)
MCH: 28.9 pg (ref 26.0–34.0)
MCHC: 33.3 g/dL (ref 30.0–36.0)
MCV: 86.8 fL (ref 78.0–100.0)
PLATELETS: 270 10*3/uL (ref 150–400)
RBC: 2.8 MIL/uL — ABNORMAL LOW (ref 3.87–5.11)
RDW: 17.6 % — AB (ref 11.5–15.5)
WBC: 7.7 10*3/uL (ref 4.0–10.5)

## 2016-06-15 LAB — GLUCOSE, CAPILLARY
GLUCOSE-CAPILLARY: 100 mg/dL — AB (ref 65–99)
GLUCOSE-CAPILLARY: 142 mg/dL — AB (ref 65–99)
GLUCOSE-CAPILLARY: 74 mg/dL (ref 65–99)
Glucose-Capillary: 89 mg/dL (ref 65–99)

## 2016-06-15 NOTE — Evaluation (Signed)
Physical Therapy Evaluation Patient Details Name: Karen [name] MRN: [id number] DOB: 07/11/1929 Today's Date: 06/15/2016   History of Present Illness   Karen [name] is a 83 y.o. female with medical history significant of asthma, abdominal lesions, achalasia, anemia, osteoarthritis, stage III chronic kidney disease, depression, DVT, essential hypertension, GERD, fibromyalgia, hyperlipidemia, hypothyroidism, iron deficiency anemia due to chronic blood loss, LBBB, history of colon cancer and ileostomy who is being referred from the cancer center for further evaluation and treatment of fever and progressive weakness.  Clinical Impression  Pt normally ambulatory with a cane but has a walker at home.  PT able to get on and off the commode and wash hands with no balance issues.     Follow Up Recommendations No PT follow up    Equipment Recommendations  None recommended by PT    Recommendations for Other Services       Precautions / Restrictions Precautions Precautions: None Restrictions Weight Bearing Restrictions: No      Mobility  Bed Mobility Overal bed mobility: Modified Independent                Transfers Overall transfer level: Modified independent Equipment used: Rolling walker (2 wheeled)                Ambulation/Gait Ambulation/Gait assistance: Modified independent (Device/Increase time) Ambulation Distance (Feet): 200 Feet Assistive device: Rolling walker (2 wheeled) Gait Pattern/deviations: WFL(Within Functional Limits)   Gait velocity interpretation: at or above normal speed for age/gender    Stairs            Wheelchair Mobility    Modified Rankin (Stroke Patients Only)       Balance                                             Pertinent Vitals/Pain Pain Assessment: 0-10 Pain Score: 4  Pain Location: chest  Pain Descriptors / Indicators: Aching Pain Intervention(s): Limited activity within patient's  tolerance    Home Living Family/patient expects to be discharged to:: Private residence Living Arrangements: Spouse/significant other Available Help at Discharge: Family Type of Home: House Home Access: Stairs to enter Entrance Stairs-Rails: Right Entrance Stairs-Number of Steps: 2 Home Layout: One level Home Equipment: Environmental consultant - 2 wheels;Cane - single point      Prior Function Level of Independence: Independent with assistive device(s)               Hand Dominance        Extremity/Trunk Assessment        Lower Extremity Assessment Lower Extremity Assessment: Overall WFL for tasks assessed    Cervical / Trunk Assessment Cervical / Trunk Assessment: Kyphotic  Communication   Communication: No difficulties  Cognition Arousal/Alertness: Awake/alert Behavior During Therapy: WFL for tasks assessed/performed Overall Cognitive Status: Within Functional Limits for tasks assessed                                        General Comments      Exercises General Exercises - Lower Extremity Ankle Circles/Pumps: Both;10 reps Quad Sets: Both;10 reps Gluteal Sets: 10 reps Heel Slides: Both;10 reps   Assessment/Plan    PT Assessment Patent does not need any further PT services  PT Problem List  PT Treatment Interventions      PT Goals (Current goals can be found in the Care Plan section)  Acute Rehab PT Goals Patient Stated Goal: to go home PT Goal Formulation: With patient Potential to Achieve Goals: Good    Frequency     Barriers to discharge        Co-evaluation               AM-PAC PT "6 Clicks" Daily Activity  Outcome Measure Difficulty turning over in bed (including adjusting bedclothes, sheets and blankets)?: A Little Difficulty moving from lying on back to sitting on the side of the bed? : A Little Difficulty sitting down on and standing up from a chair with arms (e.g., wheelchair, bedside commode, etc,.)?: None Help  needed moving to and from a bed to chair (including a wheelchair)?: None Help needed walking in hospital room?: None Help needed climbing 3-5 steps with a railing? : A Little 6 Click Score: 21    End of Session Equipment Utilized During Treatment: Gait belt Activity Tolerance: Patient tolerated treatment well Patient left: in bed Nurse Communication: Mobility status PT Visit Diagnosis: Unsteadiness on feet (R26.81)    Time: 6151-8343 PT Time Calculation (min) (ACUTE ONLY): 36 min   Charges:   PT Evaluation $PT Eval Low Complexity: 1 Procedure PT Treatments $Therapeutic Exercise: 8-22 mins   PT G Codes:   PT G-Codes **NOT FOR INPATIENT CLASS** Functional Assessment Tool Used: AM-PAC 6 Clicks Basic Mobility Functional Limitation: Mobility: Walking and moving around Mobility: Walking and Moving Around Current Status ([id number]): At least 20 percent but less than 40 percent impaired, limited or restricted Mobility: Walking and Moving Around Goal Status [phone]): At least 20 percent but less than 40 percent impaired, limited or restricted Mobility: Walking and Moving Around Discharge Status [phone]): At least 20 percent but less than 40 percent impaired, limited or restricted      Rayetta Humphrey, PT CLT [phone] 06/15/2016, 2:00 PM

## 2016-06-15 NOTE — Progress Notes (Addendum)
PROGRESS NOTE    Karen [name]  IHK:[id number] DOB: [birth year] DOA: 06/13/2016 PCP: [name], MD     Brief Narrative:  83 y/o woman with h/o metastatic colon cancer s/p ileostomy here with fever and malaise and weakness.   Assessment & Plan:   Principal Problem:   AKI (acute kidney injury) ([place]) Active Problems:   Hypothyroid   Dysphagia   Colon cancer metastasized to liver (HCC)   Hypotension   GERD (gastroesophageal reflux disease)   HTN (hypertension)   Type 2 diabetes mellitus (HCC)   Pressure injury of skin   Fever   Fever/Generalized Weakness -No obvious source of infection, CXR/UA WNL; cx are so far negative. -Will DC cefepime and observe off abx. Last temp was of 101 on 5/5 at 8 pm. -suspect viral etiology. -Her only complaint is RUQ pain: [place] without cholecystitis but she does have hepatic metastases that may account for her pain. -Check TSH/B12. -suspect weakness due to progressive colon cancer; would benefit from palliative care and will request consultation.  ARF -Resolved with IVF. -cr down to 0.91 today from 2.27 on admission. -Due to prerenal azotemia and decreased PO intake.  Metastatic Colon Cancer -Given age,frailty and not achieving adequate responses with chemotherapy, will consult palliative care when available. Would benefit from hospice discussions with PCP and oncologist.  Hypothyroidism -Check TSH. -Continue home dose of synthroid.  Dysphagia -With thrush. -Continue diflucan for at least 7 days (may have candidal esophagitis given immunesuppresion on active chemo).  DM II -Fair control. -Continue current regimen.   DVT prophylaxis: SCDs Code Status: full code Family Communication: daughter at bedside updated on plan of care and questions answered Disposition Plan: [facility] for DC home in 24-48 hours. Family requesting DISH services  Consultants:   None  Procedures:   None  Antimicrobials:  Anti-infectives    Start      Dose/Rate Route Frequency Ordered Stop   06/15/16 1000  fluconazole (DIFLUCAN) tablet 100 mg     100 mg Oral Daily 06/14/16 1618 06/22/16 0959   06/13/16 2000  ceFEPIme (MAXIPIME) 1 g in dextrose 5 % 50 mL IVPB  Status:  Discontinued     1 g 100 mL/hr over 30 Minutes Intravenous Every 24 hours 06/13/16 1752 06/14/16 1129   06/13/16 1800  fluconazole (DIFLUCAN) tablet 100 mg  Status:  Discontinued     100 mg Oral Daily 06/13/16 1752 06/14/16 1618       Subjective: Fatigued, with RUQ abdominal pain, sleepy  Objective: Vitals:   06/14/16 1451 06/14/16 2047 06/14/16 2200 06/15/16 0617  BP: (!) 143/52 (!) 117/39  (!) 159/51  Pulse: 68 79  62  Resp: 18 18  18   Temp: 98.8 F (37.1 C) (!) 101.1 F (38.4 C) 98.3 F (36.8 C) 97.9 F (36.6 C)  TempSrc:  Oral  Oral  SpO2: 99% 98%  100%  Weight:      Height:        Intake/Output Summary (Last 24 hours) at 06/15/16 1429 Last data filed at 06/15/16 1248  Gross per 24 hour  Intake          1438.75 ml  Output             1150 ml  Net           288.75 ml   Filed Weights   06/13/16 1606  Weight: 52.5 kg (115 lb 11.2 oz)    Examination:  General exam: Alert, awake, oriented x 3 Respiratory  system: Clear to auscultation. Respiratory effort normal. Cardiovascular system:RRR. No murmurs, rubs, gallops. Gastrointestinal system: Abdomen is nondistended, soft and nontender. No organomegaly or masses felt. Normal bowel sounds heard. Central nervous system: Alert and oriented. No focal neurological deficits. Extremities: No C/C/E, +pedal pulses Skin: No rashes, lesions or ulcers Psychiatry: Judgement and insight appear normal. Mood & affect appropriate.     Data Reviewed: I have personally reviewed following labs and imaging studies  CBC:  Recent Labs Lab 06/13/16 1011 06/13/16 1732 06/14/16 0657 06/15/16 0654  WBC 7.9 7.1 6.4 7.7  NEUTROABS 5.8 5.2 4.3  --   HGB 8.1* 8.0* 7.7* 8.1*  HCT 24.5* 24.3* 23.7* 24.3*  MCV 86.6  87.4 86.8 86.8  PLT 213 230 236 147   Basic Metabolic Panel:  Recent Labs Lab 06/13/16 1011 06/13/16 1732 06/14/16 0657 06/15/16 0654  NA 133* 135 139 140  K 4.9 4.3 3.7 3.7  CL 109 113* 114* 116*  CO2 15* 16* 16* 19*  GLUCOSE 151* 111* 98 108*  BUN 61* 54* 37* 24*  CREATININE 2.27* 1.83* 1.26* 0.91  CALCIUM 8.8* 8.3* 8.4* 8.4*  MG 2.1 2.0  --   --   PHOS 4.4 4.2  --   --    GFR: Estimated Creatinine Clearance: 33.1 mL/min (by C-G formula based on SCr of 0.91 mg/dL). Liver Function Tests:  Recent Labs Lab 06/13/16 1011 06/15/16 0654  AST 21 16  ALT 16 12*  ALKPHOS 113 103  BILITOT 0.2* 0.4  PROT 6.6 5.6*  ALBUMIN 2.7* 2.0*   No results for input(s): LIPASE, AMYLASE in the last 168 hours. No results for input(s): AMMONIA in the last 168 hours. Coagulation Profile: No results for input(s): INR, PROTIME in the last 168 hours. Cardiac Enzymes: No results for input(s): CKTOTAL, CKMB, CKMBINDEX, TROPONINI in the last 168 hours. BNP (last 3 results) No results for input(s): PROBNP in the last 8760 hours. HbA1C: No results for input(s): HGBA1C in the last 72 hours. CBG:  Recent Labs Lab 06/14/16 1148 06/14/16 1643 06/14/16 2135 06/15/16 0737 06/15/16 1114  GLUCAP 123* 89 106* 100* 142*   Lipid Profile: No results for input(s): CHOL, HDL, LDLCALC, TRIG, CHOLHDL, LDLDIRECT in the last 72 hours. Thyroid Function Tests:  Recent Labs  06/14/16 1708  TSH 0.598   Anemia Panel: No results for input(s): VITAMINB12, FOLATE, FERRITIN, TIBC, IRON, RETICCTPCT in the last 72 hours. Urine analysis:    Component Value Date/Time   COLORURINE YELLOW 06/13/2016 1404   APPEARANCEUR HAZY (A) 06/13/2016 1404   LABSPEC 1.016 06/13/2016 1404   PHURINE 5.0 06/13/2016 1404   GLUCOSEU NEGATIVE 06/13/2016 1404   HGBUR NEGATIVE 06/13/2016 1404   BILIRUBINUR NEGATIVE 06/13/2016 1404   KETONESUR NEGATIVE 06/13/2016 1404   PROTEINUR 30 (A) 06/13/2016 1404   UROBILINOGEN 0.2  06/15/2014 0800   NITRITE NEGATIVE 06/13/2016 1404   LEUKOCYTESUR NEGATIVE 06/13/2016 1404   Sepsis Labs: @LABRCNTIP (procalcitonin:4,lacticidven:4)  ) Recent Results (from the past 240 hour(s))  Urine culture     Status: None   Collection Time: 06/13/16  2:04 PM  Result Value Ref Range Status   Specimen Description URINE, CLEAN CATCH  Final   Special Requests NONE  Final   Culture   Final    NO GROWTH Performed at [facility], [place] [address] [place], [place] [address]    Report Status 06/15/2016 FINAL  Final  Gastrointestinal Panel by PCR , Stool     Status: None   Collection Time:  06/13/16  2:04 PM  Result Value Ref Range Status   Campylobacter species NOT DETECTED NOT DETECTED Final   Plesimonas shigelloides NOT DETECTED NOT DETECTED Final   Salmonella species NOT DETECTED NOT DETECTED Final   Yersinia enterocolitica NOT DETECTED NOT DETECTED Final   Vibrio species NOT DETECTED NOT DETECTED Final   Vibrio cholerae NOT DETECTED NOT DETECTED Final   Enteroaggregative E coli (EAEC) NOT DETECTED NOT DETECTED Final   Enteropathogenic E coli (EPEC) NOT DETECTED NOT DETECTED Final   Enterotoxigenic E coli (ETEC) NOT DETECTED NOT DETECTED Final   Shiga like toxin producing E coli (STEC) NOT DETECTED NOT DETECTED Final   Shigella/Enteroinvasive E coli (EIEC) NOT DETECTED NOT DETECTED Final   Cryptosporidium NOT DETECTED NOT DETECTED Final   Cyclospora cayetanensis NOT DETECTED NOT DETECTED Final   Entamoeba histolytica NOT DETECTED NOT DETECTED Final   Giardia lamblia NOT DETECTED NOT DETECTED Final   Adenovirus F40/41 NOT DETECTED NOT DETECTED Final   Astrovirus NOT DETECTED NOT DETECTED Final   Norovirus GI/GII NOT DETECTED NOT DETECTED Final   Rotavirus A NOT DETECTED NOT DETECTED Final   Sapovirus (I, II, IV, and V) NOT DETECTED NOT DETECTED Final  Culture, blood (routine x 2)     Status: None (Preliminary result)   Collection Time: 06/13/16  5:20 PM  Result Value Ref  Range Status   Specimen Description PORTA CATH  Final   Special Requests   Final    BOTTLES DRAWN AEROBIC AND ANAEROBIC Blood Culture adequate volume   Culture NO GROWTH < 12 HOURS  Final   Report Status PENDING  Incomplete  Culture, blood (routine x 2)     Status: None (Preliminary result)   Collection Time: 06/13/16  5:32 PM  Result Value Ref Range Status   Specimen Description RIGHT ANTECUBITAL  Final   Special Requests   Final    BOTTLES DRAWN AEROBIC AND ANAEROBIC Blood Culture adequate volume   Culture NO GROWTH < 12 HOURS  Final   Report Status PENDING  Incomplete         Radiology Studies: Dg Chest 2 View  Result Date: 06/13/2016 CLINICAL DATA:  83 y/o F; colon cancer patient on chemotherapy presenting with fevers and weakness. EXAM: CHEST  2 VIEW COMPARISON:  02/08/2016 chest radiograph. 03/28/2016 CT of the chest. FINDINGS: Port catheter with tip projecting over mid SVC is stable. Pulmonary nodule is better characterized on prior CT. Stable cardiac silhouette given projection and technique. No consolidation, effusion, or pneumothorax is identified. Multilevel degenerative changes of the spine. IMPRESSION: No acute pulmonary process identified. Electronically Signed   By: Kristine Garbe M.D.   On: 06/13/2016 19:15   US Abdomen Limited Ruq  Result Date: 06/14/2016 CLINICAL DATA:  Right upper quadrant abdominal pain. Personal history of colon cancer. EXAM: US ABDOMEN LIMITED - RIGHT UPPER QUADRANT COMPARISON:  CT scan of June 03, 2016. FINDINGS: Gallbladder: Status post cholecystectomy. Common bile duct: Diameter: 3 mm which is within normal limits. Liver: Two complex masses are noted consistent with metastatic disease. 2.7 cm mass is noted in right hepatic lobe, with 2.1 cm mass seen in left hepatic lobe. IMPRESSION: Status post cholecystectomy.  Hepatic metastatic lesions are noted. Electronically Signed   By: Marijo Conception, M.D.   On: 06/14/2016 13:43         Scheduled Meds: . ALPRAZolam  0.5 mg Oral QHS  . aspirin EC  325 mg Oral Daily  . fentaNYL  25 mcg Transdermal Q72H  .  fluconazole  100 mg Oral Daily  . insulin aspart  0-9 Units Subcutaneous TID WC  . levothyroxine  75 mcg Oral QAC breakfast  . magnesium oxide  400 mg Oral TID  . nystatin  5 mL Oral QID  . pantoprazole  40 mg Oral Daily   Continuous Infusions: . sodium chloride 75 mL/hr at 06/15/16 0230     LOS: 2 days    Time spent: 25 minutes. Greater than 50% of this time was spent in direct contact with the patient coordinating care.     Lelon Frohlich, MD [facility] Pager [phone]  If 7PM-7AM, please contact night-coverage [email or web address] Password [email or web address] 06/15/2016, 2:29 PM

## 2016-06-16 ENCOUNTER — Encounter (HOSPITAL_COMMUNITY): Payer: Self-pay | Admitting: Primary Care

## 2016-06-16 DIAGNOSIS — Z515 Encounter for palliative care: Secondary | ICD-10-CM

## 2016-06-16 DIAGNOSIS — Z7189 Other specified counseling: Secondary | ICD-10-CM

## 2016-06-16 LAB — GLUCOSE, CAPILLARY
GLUCOSE-CAPILLARY: 101 mg/dL — AB (ref 65–99)
Glucose-Capillary: 116 mg/dL — ABNORMAL HIGH (ref 65–99)

## 2016-06-16 LAB — HEMOGLOBIN A1C
HEMOGLOBIN A1C: 5.7 % — AB (ref 4.8–5.6)
MEAN PLASMA GLUCOSE: 117 mg/dL

## 2016-06-16 LAB — VITAMIN B12: Vitamin B-12: 116 pg/mL — ABNORMAL LOW (ref 180–914)

## 2016-06-16 MED ORDER — HEPARIN SOD (PORK) LOCK FLUSH 100 UNIT/ML IV SOLN
500.0000 [IU] | INTRAVENOUS | Status: AC | PRN
  Administered 2016-06-16: 500 [IU]
  Filled 2016-06-16: qty 5

## 2016-06-16 MED ORDER — FLUCONAZOLE 100 MG PO TABS: 100.0000 mg | ORAL_TABLET | Freq: Every day | ORAL | 0 refills | Status: DC

## 2016-06-16 NOTE — Care Management Important Message (Signed)
Important Message  Patient Details  Name: MONCHEL POLLITT MRN: [id number] Date of Birth: 1929/07/11   Medicare Important Message Given:  Yes    Sherald Barge, RN 06/16/2016, 1:34 PM

## 2016-06-16 NOTE — Consult Note (Signed)
Consultation Note Date: 06/16/2016   Patient Name: Karen [name]  DOB: 04-07-1929  MRN: [id number]  Age / Sex: 83 y.o., female  PCP: [name], MD Referring Physician: No att. providers found  Reason for Consultation: Establishing goals of care and Psychosocial/spiritual support  HPI/Patient Profile: 83 y.o. female  with past medical history of Colon cancer with ileostomy in 2010, abdominal adhesions, arthritis, anemia, see KD stage III, history of DVT, fibromyalgia, history of bladder cancer depression, hearing loss, asthma admitted on 06/13/2016 with acute kidney injury due to dehydration, fever.   Clinical Assessment and Goals of Care: Karen [name] is sitting up on the edge of the bed as I enter. She greets me making and keeping eye contact. Present today at bedside is daughter Lew Dawes. We talk about Karen [name]'s current health concerns. She states that she will feel better when she gets home. We talk about what the future may look like for her, and ask if she has shared what she does and does not want with her loved ones. She states that both her husband and daughter know "I don't want hospice". We talk about home hospice.   Karen [name] states that she has faith, that she was "healed one time and knows that (God) is able to do it again". She said's that she believes in the power of prayer, and that her surgeon said every time he saw her that she was a miracle. We talk about hope, that this can be for spiritual or emotional healing also, not just physical healing.  Family shares that they have an appointment with oncology this Friday. I share my concern over symptom management in the future, and encourage family to make sure that they have pain and nausea medication available whether through the cancer center or PCP Dr. Quintin Alto. I share that it is expected for Mrs. [name] to have increased weakness and  increased pain. I encourage family to call me at any time.  Healthcare power of attorney HCPOA - husband Adamari Frede primary, daughter Lew Dawes secondary.   SUMMARY OF RECOMMENDATIONS   continuous full scope treatment at this time. Karen [name] states that she was "healed one time, and (God) will be able to do it again".  Code Status/Advance Care Planning:  Full code - we talk about code status. I share with Karen [name] that when her heart naturally stops, we would press on her chest to try to restart it, and would put a tube into her long to breathe for her. I share that this is not recommended by the medical team. I ask if she has thought about what's important to her. During the pols, before Ms. set list can answer, daughter [name] states, "she's already said she wants everything done". I share with Karen [name] that we can treat the treatable, but no extraordinary measures such as CPR or intubation. I also share that these thoughts can change as time changes.   Symptom Management:   Per hospitalist, no additional [name].   Palliative  Prophylaxis:   Bowel Regimen and Frequent Pain Assessment  Additional Recommendations (Limitations, Scope, Preferences):  Full Scope Treatment  Psycho-social/Spiritual:   Desire for further Chaplaincy support:no  Additional Recommendations: Caregiving  Support/Resources and Education on Hospice  Prognosis:   < 6 months would not be surprising based on 2 hospitalizations in 6 months, metastatic burden.   Discharge Planning: Home with Home Health      Primary Diagnoses: Present on Admission: . AKI (acute kidney injury) ([place]) . Colon cancer metastasized to liver ([place]) . HTN (hypertension) . GERD (gastroesophageal reflux disease) . Hypothyroid . Hypotension   I have reviewed the medical record, interviewed the patient and family, and examined the patient. The following aspects are pertinent.  Past Medical History:  Diagnosis  Date  . Abdominal adhesions   . Achalasia    a. s/p botox injection for achalasia.  . Anemia   . Arthritis   . Blood transfusion   . Breast lump   . Chest pain    a. 2002 Cath: nl cors;  b. 2009 aden mv: nl;  c. 05/2009 Echo: nl. d. 2014: normal nuclear stress test, EF 69%.  . CKD (chronic kidney disease), stage III   . Colon cancer ([place])    a. recurrence 2016 with liver mets -  percutaneous liver biopsy and thermal ablation of a liver metastasis by interventional radiology..  . Colon polyp   . Depression   . DVT of deep femoral vein ([place])   . Essential hypertension   . Fibromyalgia   . GERD (gastroesophageal reflux disease)   . Headache   . Hearing loss   . Hernia   . History of asthma   . History of bladder cancer   . History of PSVT (paroxysmal supraventricular tachycardia)   . Hyperlipidemia   . Hypothyroidism   . Ileostomy present ([place])   . Iron deficiency anemia due to chronic blood loss   . LBBB (left bundle branch block)   . Obstruction of intestine or colon ([place]) 06/2014  . Sinus bradycardia    a. HR 30s in 08/2014 requiring holding of digoxin.  . Type 2 diabetes mellitus ([place])   . Vision abnormalities   . Wears dentures    Social History   Social History  . Marital status: Married    Spouse name: N/A  . Number of children: 4  . Years of education: N/A   Social History Main Topics  . Smoking status: Former Smoker    Packs/day: 0.25    Years: 13.00    Types: Cigarettes    Quit date: 02/11/1963  . Smokeless tobacco: Never Used  . Alcohol use No  . Drug use: No  . Sexual activity: No   Other Topics Concern  . None   Social History Narrative  . None   Family History  Problem Relation Age of Onset  . Heart disease Mother   . Stroke Mother     deceased  . Arthritis Mother   . Bladder Cancer Mother     dx in her 80s  . Colon cancer Mother   . Arthritis Father   . Heart disease Father     decesaed  . Leukemia Father   . Stroke Sister      alive/debilitated  . Hypertension Sister   . Other Sister     paralysis  . Heart disease Brother     bypass surgery  . Arthritis Brother   . Colon cancer Brother   . Bladder Cancer  Brother   . Stroke Sister     alive/debilitated  . Diabetes Sister   . Other Brother     stomach problems  . Other Brother     bladder  . Colon cancer Paternal Aunt   . Bladder Cancer Other     dx twice in her 30s-40s  . Breast cancer Other     great niece dx in her early 86s  . Prostate cancer Other     newphew  . Colon polyps Daughter   . Stroke    . Hypertension     Scheduled Meds: . ALPRAZolam  0.5 mg Oral QHS  . aspirin EC  325 mg Oral Daily  . fentaNYL  25 mcg Transdermal Q72H  . fluconazole  100 mg Oral Daily  . insulin aspart  0-9 Units Subcutaneous TID WC  . levothyroxine  75 mcg Oral QAC breakfast  . magnesium oxide  400 mg Oral TID  . nystatin  5 mL Oral QID  . pantoprazole  40 mg Oral Daily   Continuous Infusions: . sodium chloride 75 mL/hr at 06/16/16 0209   PRN Meds:.acetaminophen, dicyclomine, diphenoxylate-atropine, HYDROcodone-acetaminophen, hyoscyamine, magic mouthwash, nitroGLYCERIN, ondansetron **OR** ondansetron (ZOFRAN) IV, sodium chloride Medications Prior to Admission:  Prior to Admission medications   Medication Sig Start Date End Date Taking? Authorizing Provider  ALPRAZolam Duanne Moron) 0.5 MG tablet Take 0.5 mg by mouth at bedtime.    Yes [provider]  aspirin EC 325 MG tablet Take 1 tablet (325 mg total) by mouth daily. 01/21/16  Yes Rehman, Mechele Dawley, MD  carvedilol (COREG) 12.5 MG tablet TAKE ONE & ONE-HALF TABLETS BY MOUTH TWICE DAILY WITH  A  MEAL 06/03/16  Yes Martinique, Peter M, MD  dicyclomine (BENTYL) 10 MG capsule Take 1 capsule (10 mg total) by mouth 4 (four) times daily as needed for spasms. 05/12/16  Yes Twana First, MD  diphenoxylate-atropine (LOMOTIL) 2.5-0.025 MG tablet Take 1 tablet by mouth as needed.  02/29/16  Yes [provider]    fentaNYL (DURAGESIC - DOSED MCG/HR) 25 MCG/HR patch Place 1 patch (25 mcg total) onto the skin every 3 (three) days. 05/26/16  Yes Baird Cancer, PA-C  HYDROcodone-acetaminophen [facility]) 10-325 MG tablet  06/10/16  Yes [provider]  hyoscyamine (LEVSIN SL) 0.125 MG SL tablet Place 1 tablet (0.125 mg total) under the tongue 3 (three) times daily as needed (lower abdominal pain). 09/18/15  Yes Rehman, Mechele Dawley, MD  isosorbide mononitrate (IMDUR) 30 MG 24 hr tablet Take 15 mg by mouth daily. 10/31/15  Yes [provider]  levothyroxine (SYNTHROID, LEVOTHROID) 75 MCG tablet Take 75 mcg by mouth daily.    Yes [provider]  lidocaine-prilocaine (EMLA) cream Apply a quarter size amount to affected area 1 hour prior to coming to chemotherapy. 04/11/16  Yes Holley Bouche, NP  magnesium oxide (MAG-OX) 400 (241.3 Mg) MG tablet Take 1 tablet (400 mg total) by mouth 3 (three) times daily. Patient taking differently: Take 1 tablet by mouth 2 (two) times daily.  04/11/16  Yes Holley Bouche, NP  Multiple Vitamins-Minerals (CENTRUM WOMEN PO) Take 1 tablet by mouth daily.    Yes [provider]  NITROSTAT 0.4 MG SL tablet DISSOLVE ONE TABLET UNDER THE TONGUE EVERY 5 MINUTES AS NEEDED FOR CHEST PAIN.  DO NOT EXCEED A TOTAL OF 3 DOSES IN 15 MINUTES 11/15/13  Yes Darlin Coco, MD  Omega-3 Fatty Acids (FISH OIL) 1000 MG CAPS Take 1 capsule by  mouth daily. Reported on 02/19/2015   Yes [provider]  ondansetron (ZOFRAN-ODT) 8 MG disintegrating tablet Take 1 tablet (8 mg total) by mouth every 8 (eight) hours as needed for nausea or vomiting. 04/11/16  Yes Holley Bouche, NP  pantoprazole (PROTONIX) 40 MG tablet Take 40 mg by mouth daily.    Yes [provider]  fluconazole (DIFLUCAN) 100 MG tablet Take 1 tablet (100 mg total) by mouth daily. 06/17/16   Isaac Bliss, Rayford Halsted, MD   Allergies  Allergen Reactions  . Bactrim  [Sulfamethoxazole-Trimethoprim] Shortness Of Breath and Other (See Comments)    Hurting all over  . Codeine Palpitations and Other (See Comments)    Heart races  . Demerol Palpitations and Other (See Comments)    tachycardia  . Lidocaine Hcl Other (See Comments)    tachycardia  . Morphine And Related Palpitations    Tachycardia   . Dilaudid [Hydromorphone Hcl] Other (See Comments)    Hallucinations  . Nitrofurantoin Monohyd Macro Other (See Comments)    Unknown   . Lisinopril Cough  . Ramipril Cough   Review of Systems  Unable to perform ROS: Age    Physical Exam  Constitutional: She is oriented to person, place, and time. No distress.  HENT:  Head: Normocephalic and atraumatic.  Cardiovascular: Normal rate and regular rhythm.   Pulmonary/Chest: Effort normal. No respiratory distress.  Abdominal: Soft. She exhibits distension. There is tenderness.  Musculoskeletal: She exhibits no edema.  Neurological: She is alert and oriented to person, place, and time.  Skin: Skin is warm and dry.  Nursing note and vitals reviewed.   Vital Signs: BP (!) 146/47 (BP Location: Left Arm)   Pulse 65   Temp 97.7 F (36.5 C) (Oral)   Resp 15   Ht 5' (1.524 m)   Wt 52.5 kg (115 lb 11.2 oz)   SpO2 99%   BMI 22.60 kg/m  Pain Assessment: No/denies pain   Pain Score: Asleep   SpO2: SpO2: 99 % O2 Device:SpO2: 99 % O2 Flow Rate: .   IO: Intake/output summary:  Intake/Output Summary (Last 24 hours) at 06/16/16 1421 Last data filed at 06/16/16 0700  Gross per 24 hour  Intake             1900 ml  Output             2050 ml  Net             -150 ml    LBM: Last BM Date: 06/15/16 Baseline Weight: Weight: 52.5 kg (115 lb 11.2 oz) Most recent weight: Weight: 52.5 kg (115 lb 11.2 oz)     Palliative Assessment/Data:   Flowsheet Rows     Most Recent Value  Intake Tab  Referral Department  Hospitalist  Unit at Time of Referral  Med/Surg Unit  Palliative Care Primary Diagnosis   Cancer  Date Notified  06/13/16  Palliative Care Type  New Palliative care  Reason for referral  Clarify Goals of Care  Date of Admission  06/13/16  Date first seen by Palliative Care  06/16/16  # of days Palliative referral response time  3 Day(s)  # of days IP prior to Palliative referral  0  Clinical Assessment  Palliative Performance Scale Score  50%  Pain Max last 24 hours  Not able to report  Pain Min Last 24 hours  Not able to report  Dyspnea Max Last 24 Hours  Not able to report  Dyspnea  Min Last 24 hours  Not able to report  Psychosocial & Spiritual Assessment  Palliative Care Outcomes  Patient/Family meeting held?  Yes  Who was at the meeting?  Patient and daughter Lew Dawes  Palliative Care Outcomes  Counseled regarding hospice, Provided advance care planning, Provided psychosocial or spiritual support      Time In: 1230 Time Out: 1310 Time Total: 40 minutes Greater than 50%  of this time was spent counseling and coordinating care related to the above assessment and plan.  Signed by: Drue Novel, NP   Please contact Palliative Medicine Team phone at [phone] for questions and concerns.  For individual provider: See Shea Evans

## 2016-06-16 NOTE — Discharge Summary (Signed)
Physician Discharge Summary  HENRIETTA CIESLEWICZ KNL:[id number] DOB: 03-Jan-1930 DOA: 06/13/2016  PCP: [name], MD  Admit date: 06/13/2016 Discharge date: 06/16/2016  Time spent: 45 minutes  Recommendations for Outpatient Follow-up:  -Will be discharged home today. -Advised to follow up with PCP in 2 weeks. -B12 pending on DC. Please review at time of follow up.   Discharge Diagnoses:  Principal Problem:   AKI (acute kidney injury) ([facility]) Active Problems:   Hypothyroid   Dysphagia   Colon cancer metastasized to liver (HCC)   Hypotension   GERD (gastroesophageal reflux disease)   HTN (hypertension)   Type 2 diabetes mellitus (HCC)   Pressure injury of skin   Fever   Palliative care encounter   Discharge Condition: Stable and improved  Filed Weights   06/13/16 1606  Weight: 52.5 kg (115 lb 11.2 oz)    History of present illness:  As per Dr. Olevia Bowens on 5/4: CONSANDRA LASKE is a 83 y.o. female with medical history significant of asthma, abdominal lesions, achalasia, anemia, osteoarthritis, stage III chronic kidney disease, depression, DVT, essential hypertension, GERD, fibromyalgia, hyperlipidemia, hypothyroidism, iron deficiency anemia due to chronic blood loss, LBBB, history of colon cancer and ileostomy who is being referred from the cancer center for further evaluation and treatment of fever and progressive weakness.  Per patient's daughter, the patient has not been feeling well since last weekend. She had a low-grade temperature on Sunday associated with chills. She has also been complaining of decreased appetite, RUQ pain associated with nausea and increased output from ileostomy for several days. Per daughter, there has not been melenic stools or blood in the ileostomy bag. She denies chest pain, palpitations, dyspnea, diaphoresis, PND, orthopnea or pitting edema of the lower extremities. She denies dysuria, frequency or hematuria. Her daughter also mentions that the  patient has shown moments of confusion at times and has been sleeping more than usual.  Hospital Course:   Fever/Generalized Weakness -No obvious source of infection, CXR/UA WNL; cx are so far negative. -Will DC cefepime and observe off abx. Last temp was of 101 on 5/5 at 8 pm. -suspect viral etiology vs oropharyngeal candidal infection. -Her only complaint is RUQ pain: [place] without cholecystitis but she does have hepatic metastases that may account for her pain. -TSH ok, B12 pending on DC. -suspect weakness due to progressive colon cancer; would benefit from palliative care and will request consultation.  ARF -Resolved with IVF. -cr down to 0.91 today from 2.27 on admission. -Due to prerenal azotemia and decreased PO intake.  Metastatic Colon Cancer - Would benefit from continued hospice discussions with PCP and oncologist.  Hypothyroidism -TSH WNL. -Continue home dose of synthroid.  Dysphagia -With thrush. -Continue diflucan for at least 7 days (may have candidal esophagitis given immunesuppresion on active chemo).  DM II -Fair control. -Continue current regimen.    Procedures:  None   Consultations:  None  Discharge Instructions  Discharge Instructions    Diet - low sodium heart healthy    Complete by:  As directed    Increase activity slowly    Complete by:  As directed      Allergies as of 06/16/2016      Reactions   Bactrim [sulfamethoxazole-trimethoprim] Shortness Of Breath, Other (See Comments)   Hurting all over   Codeine Palpitations, Other (See Comments)   Heart races   Demerol Palpitations, Other (See Comments)   tachycardia   Lidocaine Hcl Other (See Comments)   tachycardia  Morphine And Related Palpitations   Tachycardia   Dilaudid [hydromorphone Hcl] Other (See Comments)   Hallucinations   Nitrofurantoin Monohyd Macro Other (See Comments)   Unknown   Lisinopril Cough   Ramipril Cough      Medication List    STOP taking these  medications   IRINOTECAN HCL IV     TAKE these medications   ALPRAZolam 0.5 MG tablet Commonly known as:  XANAX Take 0.5 mg by mouth at bedtime.   aspirin EC 325 MG tablet Take 1 tablet (325 mg total) by mouth daily.   carvedilol 12.5 MG tablet Commonly known as:  COREG TAKE ONE & ONE-HALF TABLETS BY MOUTH TWICE DAILY WITH  A  MEAL   CENTRUM WOMEN PO Take 1 tablet by mouth daily.   dicyclomine 10 MG capsule Commonly known as:  BENTYL Take 1 capsule (10 mg total) by mouth 4 (four) times daily as needed for spasms.   diphenoxylate-atropine 2.5-0.025 MG tablet Commonly known as:  LOMOTIL Take 1 tablet by mouth as needed.   fentaNYL 25 MCG/HR patch Commonly known as:  DURAGESIC - dosed mcg/hr Place 1 patch (25 mcg total) onto the skin every 3 (three) days.   Fish Oil 1000 MG Caps Take 1 capsule by mouth daily. Reported on 02/19/2015   fluconazole 100 MG tablet Commonly known as:  DIFLUCAN Take 1 tablet (100 mg total) by mouth daily. Start taking on:  06/17/2016   HYDROcodone-acetaminophen 10-325 MG tablet Commonly known as:  NORCO   hyoscyamine 0.125 MG SL tablet Commonly known as:  LEVSIN SL Place 1 tablet (0.125 mg total) under the tongue 3 (three) times daily as needed (lower abdominal pain).   isosorbide mononitrate 30 MG 24 hr tablet Commonly known as:  IMDUR Take 15 mg by mouth daily.   levothyroxine 75 MCG tablet Commonly known as:  SYNTHROID, LEVOTHROID Take 75 mcg by mouth daily.   lidocaine-prilocaine cream Commonly known as:  EMLA Apply a quarter size amount to affected area 1 hour prior to coming to chemotherapy.   magnesium oxide 400 (241.3 Mg) MG tablet Commonly known as:  MAG-OX Take 1 tablet (400 mg total) by mouth 3 (three) times daily. What changed:  when to take this   NITROSTAT 0.4 MG SL tablet Generic drug:  nitroGLYCERIN DISSOLVE ONE TABLET UNDER THE TONGUE EVERY 5 MINUTES AS NEEDED FOR CHEST PAIN.  DO NOT EXCEED A TOTAL OF 3 DOSES IN 15  MINUTES   ondansetron 8 MG disintegrating tablet Commonly known as:  ZOFRAN-ODT Take 1 tablet (8 mg total) by mouth every 8 (eight) hours as needed for nausea or vomiting.   pantoprazole 40 MG tablet Commonly known as:  PROTONIX Take 40 mg by mouth daily.      Allergies  Allergen Reactions  . Bactrim [Sulfamethoxazole-Trimethoprim] Shortness Of Breath and Other (See Comments)    Hurting all over  . Codeine Palpitations and Other (See Comments)    Heart races  . Demerol Palpitations and Other (See Comments)    tachycardia  . Lidocaine Hcl Other (See Comments)    tachycardia  . Morphine And Related Palpitations    Tachycardia   . Dilaudid [Hydromorphone Hcl] Other (See Comments)    Hallucinations  . Nitrofurantoin Monohyd Macro Other (See Comments)    Unknown   . Lisinopril Cough  . Ramipril Cough   Follow-up Information    Sasser, Silvestre Moment, MD. Schedule an appointment as soon as possible for a visit in 2 week(s).  Specialty:  Family Medicine Contact information: [address] [place] [address] [phone]            The results of significant diagnostics from this hospitalization (including imaging, microbiology, ancillary and laboratory) are listed below for reference.    Significant Diagnostic Studies: Dg Chest 2 View  Result Date: 06/13/2016 CLINICAL DATA:  83 y/o F; colon cancer patient on chemotherapy presenting with fevers and weakness. EXAM: CHEST  2 VIEW COMPARISON:  02/08/2016 chest radiograph. 03/28/2016 CT of the chest. FINDINGS: Port catheter with tip projecting over mid SVC is stable. Pulmonary nodule is better characterized on prior CT. Stable cardiac silhouette given projection and technique. No consolidation, effusion, or pneumothorax is identified. Multilevel degenerative changes of the spine. IMPRESSION: No acute pulmonary process identified. Electronically Signed   By: Kristine Garbe M.D.   On: 06/13/2016 19:15   Ct Abdomen Pelvis W  Contrast  Result Date: 06/03/2016 CLINICAL DATA:  Colon cancer restaging with history of liver ablation. EXAM: CT ABDOMEN AND PELVIS WITH CONTRAST TECHNIQUE: Multidetector CT imaging of the abdomen and pelvis was performed using the standard protocol following bolus administration of intravenous contrast. CONTRAST:  36mL ISOVUE-300 IOPAMIDOL (ISOVUE-300) INJECTION 61% COMPARISON:  01/09/2016 FINDINGS: Lower chest:  Heart is enlarged. Hepatobiliary: Interval progression of metastatic lesion lateral segment left liver measuring 4.4 x 6.7 cm today compared to 2.6 x 3.3 cm previously. 2.7 cm lesion in the posterior right liver was 2.0 cm previously. 9 mm lesion posterior right hepatic dome is new in the interval. Other small liver metastases seen previously have progressed in the interval. Gallbladder surgically absent. No intrahepatic or extrahepatic biliary dilation. Pancreas: No focal mass lesion. No dilatation of the main duct. No intraparenchymal cyst. No peripancreatic edema. Spleen: No splenomegaly. No focal mass lesion. Adrenals/Urinary Tract: No adrenal nodule or mass. 3.3 cm well-defined water density lesion interpolar right kidney slightly progressed from 2.9 cm previously. Left kidney unremarkable. No evidence for hydroureter. The urinary bladder appears normal for the degree of distention. Stomach/Bowel: Stomach is nondistended. No gastric wall thickening. No evidence of outlet obstruction. Duodenum is normally positioned as is the ligament of Treitz. No small bowel wall thickening. No small bowel dilatation. Patient is status post right hemicolectomy. Right lower quadrant ileostomy noted. Hartmann's pouch identified with no substantial change in the masslike lesion associated with the descending segment and diverticular changes in the sigmoid portion. Vascular/Lymphatic: There is abdominal aortic atherosclerosis without aneurysm. There is no gastrohepatic or hepatoduodenal ligament lymphadenopathy. No  intraperitoneal or retroperitoneal lymphadenopathy. No pelvic sidewall lymphadenopathy. Reproductive: Uterus is surgically absent. There is no adnexal mass. Other: No intraperitoneal free fluid. Musculoskeletal: Patient is status post left total hip replacement. Degenerative changes noted in the right hip. Tiny sclerotic foci lower lumbar spine similar to prior and likely bone islands. IMPRESSION: 1. Continued interval progression of liver metastases with enlargement of previous existing lesions and interval development of a new lesion. 2. Stable mass lesion in the descending colon segment of the Hartmann's pouch in this patient with right lower quadrant ileostomy. 3. Abdominal aortic atherosclerosis. Electronically Signed   By: Misty Stanley M.D.   On: 06/03/2016 14:14   US Abdomen Limited Ruq  Result Date: 06/14/2016 CLINICAL DATA:  Right upper quadrant abdominal pain. Personal history of colon cancer. EXAM: US ABDOMEN LIMITED - RIGHT UPPER QUADRANT COMPARISON:  CT scan of June 03, 2016. FINDINGS: Gallbladder: Status post cholecystectomy. Common bile duct: Diameter: 3 mm which is within normal limits. Liver: Two  complex masses are noted consistent with metastatic disease. 2.7 cm mass is noted in right hepatic lobe, with 2.1 cm mass seen in left hepatic lobe. IMPRESSION: Status post cholecystectomy.  Hepatic metastatic lesions are noted. Electronically Signed   By: Marijo Conception, M.D.   On: 06/14/2016 13:43    Microbiology: Recent Results (from the past 240 hour(s))  Urine culture     Status: None   Collection Time: 06/13/16  2:04 PM  Result Value Ref Range Status   Specimen Description URINE, CLEAN CATCH  Final   Special Requests NONE  Final   Culture   Final    NO GROWTH Performed at [facility], [address], [place], [place] [address]    Report Status 06/15/2016 FINAL  Final  Gastrointestinal Panel by PCR , Stool     Status: None   Collection Time: 06/13/16  2:04 PM  Result Value  Ref Range Status   Campylobacter species NOT DETECTED NOT DETECTED Final   Plesimonas shigelloides NOT DETECTED NOT DETECTED Final   Salmonella species NOT DETECTED NOT DETECTED Final   Yersinia enterocolitica NOT DETECTED NOT DETECTED Final   Vibrio species NOT DETECTED NOT DETECTED Final   Vibrio cholerae NOT DETECTED NOT DETECTED Final   Enteroaggregative E coli (EAEC) NOT DETECTED NOT DETECTED Final   Enteropathogenic E coli (EPEC) NOT DETECTED NOT DETECTED Final   Enterotoxigenic E coli (ETEC) NOT DETECTED NOT DETECTED Final   Shiga like toxin producing E coli (STEC) NOT DETECTED NOT DETECTED Final   Shigella/Enteroinvasive E coli (EIEC) NOT DETECTED NOT DETECTED Final   Cryptosporidium NOT DETECTED NOT DETECTED Final   Cyclospora cayetanensis NOT DETECTED NOT DETECTED Final   Entamoeba histolytica NOT DETECTED NOT DETECTED Final   Giardia lamblia NOT DETECTED NOT DETECTED Final   Adenovirus F40/41 NOT DETECTED NOT DETECTED Final   Astrovirus NOT DETECTED NOT DETECTED Final   Norovirus GI/GII NOT DETECTED NOT DETECTED Final   Rotavirus A NOT DETECTED NOT DETECTED Final   Sapovirus (I, II, IV, and V) NOT DETECTED NOT DETECTED Final  Culture, blood (routine x 2)     Status: None (Preliminary result)   Collection Time: 06/13/16  5:20 PM  Result Value Ref Range Status   Specimen Description PORTA CATH  Final   Special Requests   Final    BOTTLES DRAWN AEROBIC AND ANAEROBIC Blood Culture adequate volume   Culture NO GROWTH 3 DAYS  Final   Report Status PENDING  Incomplete  Culture, blood (routine x 2)     Status: None (Preliminary result)   Collection Time: 06/13/16  5:32 PM  Result Value Ref Range Status   Specimen Description RIGHT ANTECUBITAL  Final   Special Requests   Final    BOTTLES DRAWN AEROBIC AND ANAEROBIC Blood Culture adequate volume   Culture NO GROWTH 3 DAYS  Final   Report Status PENDING  Incomplete     Labs: Basic Metabolic Panel:  Recent Labs Lab  06/13/16 1011 06/13/16 1732 06/14/16 0657 06/15/16 0654  NA 133* 135 139 140  K 4.9 4.3 3.7 3.7  CL 109 113* 114* 116*  CO2 15* 16* 16* 19*  GLUCOSE 151* 111* 98 108*  BUN 61* 54* 37* 24*  CREATININE 2.27* 1.83* 1.26* 0.91  CALCIUM 8.8* 8.3* 8.4* 8.4*  MG 2.1 2.0  --   --   PHOS 4.4 4.2  --   --    Liver Function Tests:  Recent Labs Lab 06/13/16 1011 06/15/16  0654  AST 21 16  ALT 16 12*  ALKPHOS 113 103  BILITOT 0.2* 0.4  PROT 6.6 5.6*  ALBUMIN 2.7* 2.0*   No results for input(s): LIPASE, AMYLASE in the last 168 hours. No results for input(s): AMMONIA in the last 168 hours. CBC:  Recent Labs Lab 06/13/16 1011 06/13/16 1732 06/14/16 0657 06/15/16 0654  WBC 7.9 7.1 6.4 7.7  NEUTROABS 5.8 5.2 4.3  --   HGB 8.1* 8.0* 7.7* 8.1*  HCT 24.5* 24.3* 23.7* 24.3*  MCV 86.6 87.4 86.8 86.8  PLT 213 230 236 270   Cardiac Enzymes: No results for input(s): CKTOTAL, CKMB, CKMBINDEX, TROPONINI in the last 168 hours. BNP: BNP (last 3 results) No results for input(s): BNP in the last 8760 hours.  ProBNP (last 3 results) No results for input(s): PROBNP in the last 8760 hours.  CBG:  Recent Labs Lab 06/15/16 1114 06/15/16 1635 06/15/16 2132 06/16/16 0818 06/16/16 1220  GLUCAP 142* 74 89 101* 116*       Signed:  HERNANDEZ ACOSTA,ESTELA  [facility] Pager: [phone] 06/16/2016, 6:39 PM

## 2016-06-16 NOTE — Care Management Note (Signed)
Case Management Note  Patient Details  Name: Karen [name] MRN: [id number] Date of Birth: 30-Apr-1929  Subjective/Objective:                  Pt admitted with AKI. She is from home, lives with her spouse and is ind with ADL's at baseline. Her daughter assist when needed and is at the bedside. Pt has PCP, transportation and insurance with drug coverage. She is receiving chemo pta. Pt is on the [place] registry. Pt uses a cane with ambulation. PT has recommended HH PT and pt requests [facility] as she has used them in the past. She is aware that [facility] has 48hrs to make first visit.   Action/Plan: Pt will be automatically referred for [facility] transition calls at DC. Brad, [facility] rep, is aware of referral and will obtain pt info from chart. Pt discharging home today with HH. Pt's daughter at bedside to provide transport home.   Expected Discharge Date:  06/16/16               Expected Discharge Plan:  [place]  In-House Referral:  Hospice / Palliative Care  Discharge planning Services  CM Consult  Post Acute Care Choice:  Home Health Choice offered to:  Patient  HH Arranged:  PT [place]:  [place]  Status of Service:  Completed, signed off  Sherald Barge, RN 06/16/2016, 1:30 PM

## 2016-06-16 NOTE — Progress Notes (Signed)
Patient is to be discharged home and in stable condition. Patient and daughter present for discharge instructions and both verbalize understanding. Port flushed and deaccessed. Patient escorted out by staff.  Celestia Khat, RN

## 2016-06-17 DIAGNOSIS — Z932 Ileostomy status: Secondary | ICD-10-CM | POA: Diagnosis not present

## 2016-06-17 DIAGNOSIS — M797 Fibromyalgia: Secondary | ICD-10-CM | POA: Diagnosis not present

## 2016-06-17 DIAGNOSIS — J45909 Unspecified asthma, uncomplicated: Secondary | ICD-10-CM | POA: Diagnosis not present

## 2016-06-17 DIAGNOSIS — I129 Hypertensive chronic kidney disease with stage 1 through stage 4 chronic kidney disease, or unspecified chronic kidney disease: Secondary | ICD-10-CM | POA: Diagnosis not present

## 2016-06-17 DIAGNOSIS — C189 Malignant neoplasm of colon, unspecified: Secondary | ICD-10-CM | POA: Diagnosis not present

## 2016-06-17 DIAGNOSIS — D500 Iron deficiency anemia secondary to blood loss (chronic): Secondary | ICD-10-CM | POA: Diagnosis not present

## 2016-06-17 DIAGNOSIS — C787 Secondary malignant neoplasm of liver and intrahepatic bile duct: Secondary | ICD-10-CM | POA: Diagnosis not present

## 2016-06-17 DIAGNOSIS — F329 Major depressive disorder, single episode, unspecified: Secondary | ICD-10-CM | POA: Diagnosis not present

## 2016-06-17 DIAGNOSIS — R530 Neoplastic (malignant) related fatigue: Secondary | ICD-10-CM | POA: Diagnosis not present

## 2016-06-17 DIAGNOSIS — N183 Chronic kidney disease, stage 3 (moderate): Secondary | ICD-10-CM | POA: Diagnosis not present

## 2016-06-17 DIAGNOSIS — Z955 Presence of coronary angioplasty implant and graft: Secondary | ICD-10-CM | POA: Diagnosis not present

## 2016-06-17 DIAGNOSIS — E1122 Type 2 diabetes mellitus with diabetic chronic kidney disease: Secondary | ICD-10-CM | POA: Diagnosis not present

## 2016-06-17 DIAGNOSIS — R1310 Dysphagia, unspecified: Secondary | ICD-10-CM | POA: Diagnosis not present

## 2016-06-18 LAB — CULTURE, BLOOD (ROUTINE X 2)
Culture: NO GROWTH
Culture: NO GROWTH
SPECIAL REQUESTS: ADEQUATE
Special Requests: ADEQUATE

## 2016-06-19 DIAGNOSIS — I129 Hypertensive chronic kidney disease with stage 1 through stage 4 chronic kidney disease, or unspecified chronic kidney disease: Secondary | ICD-10-CM | POA: Diagnosis not present

## 2016-06-19 DIAGNOSIS — R530 Neoplastic (malignant) related fatigue: Secondary | ICD-10-CM | POA: Diagnosis not present

## 2016-06-19 DIAGNOSIS — E1122 Type 2 diabetes mellitus with diabetic chronic kidney disease: Secondary | ICD-10-CM | POA: Diagnosis not present

## 2016-06-19 DIAGNOSIS — C787 Secondary malignant neoplasm of liver and intrahepatic bile duct: Secondary | ICD-10-CM | POA: Diagnosis not present

## 2016-06-19 DIAGNOSIS — C189 Malignant neoplasm of colon, unspecified: Secondary | ICD-10-CM | POA: Diagnosis not present

## 2016-06-19 DIAGNOSIS — N183 Chronic kidney disease, stage 3 (moderate): Secondary | ICD-10-CM | POA: Diagnosis not present

## 2016-06-20 ENCOUNTER — Encounter (HOSPITAL_BASED_OUTPATIENT_CLINIC_OR_DEPARTMENT_OTHER): Payer: Medicare Other | Admitting: Oncology

## 2016-06-20 ENCOUNTER — Encounter (HOSPITAL_COMMUNITY): Payer: Medicare Other

## 2016-06-20 ENCOUNTER — Encounter (HOSPITAL_COMMUNITY): Payer: Self-pay

## 2016-06-20 DIAGNOSIS — R627 Adult failure to thrive: Secondary | ICD-10-CM

## 2016-06-20 DIAGNOSIS — E8809 Other disorders of plasma-protein metabolism, not elsewhere classified: Secondary | ICD-10-CM

## 2016-06-20 DIAGNOSIS — I129 Hypertensive chronic kidney disease with stage 1 through stage 4 chronic kidney disease, or unspecified chronic kidney disease: Secondary | ICD-10-CM | POA: Diagnosis not present

## 2016-06-20 DIAGNOSIS — C189 Malignant neoplasm of colon, unspecified: Secondary | ICD-10-CM

## 2016-06-20 DIAGNOSIS — R609 Edema, unspecified: Secondary | ICD-10-CM

## 2016-06-20 DIAGNOSIS — N183 Chronic kidney disease, stage 3 (moderate): Secondary | ICD-10-CM | POA: Diagnosis not present

## 2016-06-20 DIAGNOSIS — R5383 Other fatigue: Secondary | ICD-10-CM | POA: Diagnosis not present

## 2016-06-20 DIAGNOSIS — R530 Neoplastic (malignant) related fatigue: Secondary | ICD-10-CM | POA: Diagnosis not present

## 2016-06-20 DIAGNOSIS — R52 Pain, unspecified: Secondary | ICD-10-CM

## 2016-06-20 DIAGNOSIS — E1122 Type 2 diabetes mellitus with diabetic chronic kidney disease: Secondary | ICD-10-CM | POA: Diagnosis not present

## 2016-06-20 DIAGNOSIS — D649 Anemia, unspecified: Secondary | ICD-10-CM

## 2016-06-20 DIAGNOSIS — R630 Anorexia: Secondary | ICD-10-CM

## 2016-06-20 DIAGNOSIS — C787 Secondary malignant neoplasm of liver and intrahepatic bile duct: Secondary | ICD-10-CM

## 2016-06-20 MED ORDER — FUROSEMIDE 20 MG PO TABS: 40.0000 mg | ORAL_TABLET | Freq: Every day | ORAL | 0 refills | Status: DC | PRN

## 2016-06-20 NOTE — Patient Instructions (Signed)
[facility] at [facility] Discharge Instructions  RECOMMENDATIONS MADE BY THE CONSULTANT AND ANY TEST RESULTS WILL BE SENT TO YOUR REFERRING PHYSICIAN.  You were seen today by Dr. Louise Zhou    Thank you for choosing [facility] at [facility] to provide your oncology and hematology care.  To afford each patient quality time with our provider, please arrive at least 15 minutes before your scheduled appointment time.    If you have a lab appointment with the Cancer Center please come in thru the  Main Entrance and check in at the main information desk  You need to re-schedule your appointment should you arrive 10 or more minutes late.  We strive to give you quality time with our providers, and arriving late affects you and other patients whose appointments are after yours.  Also, if you no show three or more times for appointments you may be dismissed from the clinic at the providers discretion.     Again, thank you for choosing [facility].  Our hope is that these requests will decrease the amount of time that you wait before being seen by our physicians.       _____________________________________________________________  Should you have questions after your visit to  Cancer Center, please contact our office at [phone] between the hours of 8:30 a.m. and 4:30 p.m.  Voicemails left after 4:30 p.m. will not be returned until the following business day.  For prescription refill requests, have your pharmacy contact our office.       Resources For Cancer Patients and their Caregivers ? American Cancer Society: Can assist with transportation, wigs, general needs, runs Look Good Feel Better.        1-888-227-6333 ? Cancer Care: Provides financial assistance, online support groups, medication/co-pay assistance.  1-800-813-HOPE (4673) ? [facility] Assists [place] cancer patients and their  families through emotional , educational and financial support.  336-427-4357 ? [place] DSS Where to apply for food stamps, Medicaid and utility assistance. 336-342-1394 ? [facility]: Transportation to medical appointments. 336-347-2287 ? Social Security Administration: May apply for disability if have a Stage IV cancer. 336-342-7796 1-800-772-1213 ? [place] Aging, Disability and Transit Services: Assists with nutrition, care and transit needs. 336-349-2343  Cancer Center Support Programs: @10RELATIVEDAYS@ > Cancer Support Group  2nd Tuesday of the month 1pm-2pm, [name]  > Creative Journey  3rd Tuesday of the month 1130am-1pm, [name]  > Look Good Feel Better  1st Wednesday of the month 10am-12 noon, [name] (Call American Cancer Society to register 1-800-395-5775)    

## 2016-06-20 NOTE — Progress Notes (Signed)
No treatment today per MD.  

## 2016-06-20 NOTE — Progress Notes (Signed)
[name], MD [place] / [place] [id number]   DIAGNOSIS: Colon carcinoma with permanent ostomy Diversion Colitis Achalasia Iron deficiency, anemia secondary to GI related blood loss Hemeoccult positive stools Rising CEA with CT imaging on 07/13/2014 1.3 cm mass in L hepatic lobe Subcapsular mass in anterior pole of L kidney suspicious for RCC Cutaneous microwave ablation of her now biopsy-proven metastatic colonic adenocarcinoma to the left liver 09/01/2014    Colon cancer metastasized to liver [facility])   04/12/1987 Definitive Surgery    Dr. Lindalou Hose at [facility]      09/01/2014 Pathology Results    Liver, needle/core biopsy - METASTATIC ADENOCARCINOMA.      09/01/2014 Miscellaneous    MWA left liver lesion, [name] (IR)      01/18/2015 Progression    PET scan      01/18/2015 PET scan    Recurrence of  tumor within segment 2 of the liver. There are 2 new lesions identified within segment 5 and segment 6 of the liver worrisome for additional sites of metastasis. Evidence of tumor recurrence at the intra colonic anastomosis is noted...      01/28/2015 - 04/16/2015 Chemotherapy    Xeloda 1500 mg BID 7 days on and 7 days off beginning in December 2016.      04/12/2015 Imaging    CT C/A/P progression of disease, enlargement of metastatic lesion in segments 2/3 of liver, development of mass with colon at junction of descending colon and sigmoid, indeterminate lesion in lower pole of the left kidney      04/20/2015 - 01/07/2016 Antibody Plan    Vectibix every 2 weeks, single-agent.      06/12/2015 - 06/13/2015 Hospital Admission    Hypotension, diarrhea, ARF, UTI      06/15/2015 Adverse Reaction    Vectibix rash      06/15/2015 Treatment Plan Change    Vectibix reduced to 5 mg/m2      07/13/2015 Treatment Plan Change    Treatment deferred due to hypomagnesemia and palpitations.  Escorted to ED.      09/24/2015 Imaging    CT abd/pelvis-  Liver lesions as follows:  Segment 2 left liver lobe hypodense 3.5 x 2.2 cm liver mass, previously 3.1 x 2.0 cm, mildly increased. Medial segment 6 right liver lobe 1.7 x 0.9 cm hypodense liver mass, new. Peripheral segment 6 right liver lobe 1.0 x 0.7 cm hypodense liver mass, new. Serosal 0.7 cm segment 5 right liver lobe mass, previously 0.6 cm, not appreciably changed. Hypodense 0.6 cm liver lesion adjacent to the IVC, previously 0.6 cm, unchanged.      09/24/2015 Imaging    CT chest- New 3 mm right upper lobe pulmonary nodule, indeterminate, pulmonary metastasis not excluded.      11/04/2015 Imaging    CT abd/pelvis- Mechanical small-bowel obstruction at the level of the mid small bowel, probably due to adhesions. No free air. No fluid collections. 2. Liver metastases appear stable. No new or progressive metastatic disease in the abdomen or pelvis.      01/09/2016 Imaging    CT CAP- Interval increase in size and prominence of RIGHT upper lobe pulmonary nodule measuring 4 mm is highly concerning for pulmonary metastasis. New enhancing lesion in the LEFT lateral hepatic lobe consistent with progression of hepatic metastasis. Stable mass along the descending colon was hypermetabolic on comparison PET-CT scan.  01/09/2016 Progression    CT imaging demonstrates new hepatic lobe lesion      02/08/2016 -  Chemotherapy    The patient had palonosetron (ALOXI) injection 0.25 mg, 0.25 mg, Intravenous,  Once, 1 of 4 cycles  irinotecan (CAMPTOSAR) 100 mg in dextrose 5 % 500 mL chemo infusion, 65 mg/m2 = 100 mg (100 % of original dose 65 mg/m2), Intravenous,  Once, 1 of 4 cycles Dose modification: 100 mg/m2 (original dose 65 mg/m2, Cycle 1, Reason: Provider Judgment), 75 mg/m2 (original dose 65 mg/m2, Cycle 1, Reason: Provider Judgment), 65 mg/m2 (original dose 65 mg/m2, Cycle 1, Reason: Provider Judgment), 75 mg/m2 (original dose 65 mg/m2, Cycle 1,  Reason: Provider Judgment)  for chemotherapy treatment.        03/28/2016 Imaging    CT angio chest- No definite evidence of pulmonary embolus.  7 mm right upper lobe pulmonary nodule is noted consistent with metastatic lesion.  Probable 6 cm left hepatic metastatic lesion is noted as well.      06/03/2016 Imaging    CT abd/pelvis- 1. Continued interval progression of liver metastases with enlargement of previous existing lesions and interval development of a new lesion. 2. Stable mass lesion in the descending colon segment of the Hartmann's pouch in this patient with right lower quadrant ileostomy. 3. Abdominal aortic atherosclerosis.      06/03/2016 Progression    CT scan demonstrates progression of disease with new liver lesion       History of Present Illness: Darah returns today for further follow-up of stage IV CRC s/p microwave ablation of a solitary liver lesion. Unfortunately repeat imaging in December revealed other liver disease and recurrence at the prior anastomotic site.  She also has a L renal lesion that is under observation. She has other health issues including chronic LBP. She has progressed through XELODA and Vectibux. She will not consider hospice. She is on single agent irinotecan, dose reduced.   Patient returns today for follow up. She was recently hospitalized from 5/4 - 06/16/16 with fever and RUQ pain.  She is accompanied by her daughter Katharine Look today.   Today I discussed the patient's recent CT scan with the patient and her daughter. This showed continued progression of liver metastases. We also discussed her recent labs. The patient is anemic and has decreased albumin levels. I advised the patient that I don't believe she is a candidate for additional chemotherapy based on her current health.  I encouraged the patient to focus on quality of life to enjoy her remaining time with her loved ones. The patient states "I just hate to give up." Her daughter  voices support for focusing on quality of life rather than pursuing additional chemotherapy given the patient's condition. She states, "This isn't giving up, mom."  The patient's daughter reports the patient has swelling to her bilateral feet. The patient reports she has soreness to her  epigastrium which radiates across her right abdomen, reporting "I hurt." She is trying to stay hydrated, and reports she is "trying to eat," but taste changes make food less appetizing and she is not eating a lot.    MEDICAL HISTORY: Past Medical History:  Diagnosis Date  . Abdominal adhesions   . Achalasia    a. s/p botox injection for achalasia.  . Anemia   . Arthritis   . Blood transfusion   . Breast lump   . Chest pain    a. 2002 Cath: nl cors;  b. 2009 aden mv: nl;  c. 05/2009 Echo: nl. d. 2014: normal nuclear stress test, EF 69%.  . CKD (chronic kidney disease), stage III   . Colon cancer ([place])    a. recurrence 2016 with liver mets -  percutaneous liver biopsy and thermal ablation of a liver metastasis by interventional radiology..  . Colon polyp   . Depression   . DVT of deep femoral vein ([place])   . Essential hypertension   . Fibromyalgia   . GERD (gastroesophageal reflux disease)   . Headache   . Hearing loss   . Hernia   . History of asthma   . History of bladder cancer   . History of PSVT (paroxysmal supraventricular tachycardia)   . Hyperlipidemia   . Hypothyroidism   . Ileostomy present ([place])   . Iron deficiency anemia due to chronic blood loss   . LBBB (left bundle branch block)   . Obstruction of intestine or colon ([place]) 06/2014  . Sinus bradycardia    a. HR 30s in 08/2014 requiring holding of digoxin.  . Type 2 diabetes mellitus ([place])   . Vision abnormalities   . Wears dentures   has Left bundle branch block; Chest pain, exertional; Benign hypertensive heart disease without heart failure; Hypothyroid; Small bowel obstruction, partial ([place]); Dysphagia; Paroxysmal SVT  (supraventricular tachycardia) ([place]); Ileostomy status ([place]); Unstable angina ([place]); Parastomal hernia; Iron deficiency anemia due to chronic blood loss; Malabsorption of iron; Acute abdominal pain; Acute renal failure ([place]); Hyperglycemia; UTI (urinary tract infection); Small bowel obstruction ([place]); Abdominal pain, acute; Liver mass, left lobe; Colon cancer metastasized to liver [facility]); Liver lesion; Sinus bradycardia; Family history of colon cancer; Family history of bladder cancer; Family history of breast cancer; Genetic testing; Counseling regarding end of life decision making; Neoplasm related pain; AKI (acute kidney injury) ([place]); Urinary tract infection; Hypotension; Diarrhea; Elevated troponin; CKD (chronic kidney disease) stage 3, GFR 30-59 ml/min; Hypomagnesemia; SBO (small bowel obstruction) ([facility]); GERD (gastroesophageal reflux disease); and HTN (hypertension) on her problem list.     is allergic to bactrim [sulfamethoxazole-trimethoprim]; codeine; demerol; lidocaine hcl; morphine and related; dilaudid [hydromorphone hcl]; nitrofurantoin monohyd macro; lisinopril; and ramipril.   Iron Infusion in March 2016. Mammogram performed in 2015. Reported multiple hernias.   SURGICAL HISTORY: Past Surgical History:  Procedure Laterality Date  . ABDOMINAL HYSTERECTOMY    . APPENDECTOMY    . BOTOX INJECTION N/A 09/01/2013   Procedure: BOTOX INJECTION;  Surgeon: Rogene Houston, MD;  Location: [facility];  Service: Endoscopy;  Laterality: N/A;  . BREAST SURGERY     right breast biopsy  . CARDIAC CATHETERIZATION  12/10/2000   THE LEFT VENTRICLE IS MILDY DILATED. THERE IS MILD TO MODERATE DIFFUSE HYPOKINESIS WITH EF 35%  . CATARACT EXTRACTION W/PHACO  11/13/2011   Procedure: CATARACT EXTRACTION PHACO AND INTRAOCULAR LENS PLACEMENT (IOC);  Surgeon: Tonny Branch, MD;  Location: [facility] ORS;  Service: Ophthalmology;  Laterality: Right;  CDE 12.26  . CATARACT EXTRACTION W/PHACO  12/08/2011   Procedure:  CATARACT EXTRACTION PHACO AND INTRAOCULAR LENS PLACEMENT (IOC);  Surgeon: Tonny Branch, MD;  Location: [facility] ORS;  Service: Ophthalmology;  Laterality: Left;  CDE 15.11  . CHOLECYSTECTOMY    . COLON CANCER SURGERY    . COLON SURGERY    . COLONOSCOPY  09/26/2011   Procedure: COLONOSCOPY;  Surgeon: Rogene Houston, MD;  Location: [facility];  Service: Endoscopy;  Laterality: N/A;  215  . CORONARY ANGIOPLASTY    . ESOPHAGEAL DILATION N/A 01/18/2016   Procedure: ESOPHAGEAL  DILATION;  Surgeon: Rogene Houston, MD;  [facility]: [facility];  Service: Endoscopy;  Laterality: N/A;  . ESOPHAGOGASTRODUODENOSCOPY  02/13/2011   Procedure: ESOPHAGOGASTRODUODENOSCOPY (EGD);  Surgeon: Rogene Houston, MD;  [facility]: [facility];  Service: Endoscopy;  Laterality: N/A;  1030  . ESOPHAGOGASTRODUODENOSCOPY N/A 09/01/2013   Procedure: ESOPHAGOGASTRODUODENOSCOPY (EGD);  Surgeon: Rogene Houston, MD;  [facility];  Service: Endoscopy;  Laterality: N/A;  830  . ESOPHAGOGASTRODUODENOSCOPY N/A 01/18/2016   Procedure: ESOPHAGOGASTRODUODENOSCOPY (EGD);  Surgeon: Rogene Houston, MD;  [facility];  Service: Endoscopy;  Laterality: N/A;  1120  . EYE SURGERY  2014   catarac surgery  . ILEOSTOMY    . ILEOSTOMY    . JOINT REPLACEMENT Left 2008  . liver biopsy and ablation  09/01/14  . ovarian cystectomy 1955    . PORTACATH PLACEMENT Left 02/08/2016   Procedure: INSERTION PORT-A-CATH;  Surgeon: Aviva Signs, MD;  Location: [facility];  Service: General;  Laterality: Left;  . ROTATOR CUFF REPAIR    . TONSILLECTOMY      SOCIAL HISTORY: Social History   Social History  . Marital status: Married    Spouse name: N/A  . Number of children: 4  . Years of education: N/A   Social History Main Topics  . Smoking status: Former Smoker    Packs/day: 0.25    Years: 13.00    Types: Cigarettes    Quit date: 02/11/1963  . Smokeless tobacco: Never Used  . Alcohol use No  . Drug use: No  . Sexual activity: No     Other Topics Concern  . None   Social History Narrative  . None  Reads inspirational books Has 33 Great-Grandchildren and 57 Grandchildren  Father died 55 Mother died 47 Oldest brother died 71  FAMILY HISTORY: Family History  Problem Relation Age of Onset  . Heart disease Mother   . Stroke Mother        deceased  . Arthritis Mother   . Bladder Cancer Mother        dx in her 75s  . Colon cancer Mother   . Arthritis Father   . Heart disease Father        decesaed  . Leukemia Father   . Stroke Sister        alive/debilitated  . Hypertension Sister   . Other Sister        paralysis  . Heart disease Brother        bypass surgery  . Arthritis Brother   . Colon cancer Brother   . Bladder Cancer Brother   . Stroke Sister        alive/debilitated  . Diabetes Sister   . Other Brother        stomach problems  . Other Brother        bladder  . Colon cancer Paternal Aunt   . Bladder Cancer Other        dx twice in her 30s-40s  . Breast cancer Other        great niece dx in her early 68s  . Prostate cancer Other        newphew  . Colon polyps Daughter   . Stroke Unknown   . Hypertension Unknown     Review of Systems  Constitutional: Positive for malaise/fatigue.  HENT: Negative.        Taste changes  Eyes: Negative.   Respiratory: Negative.  Negative for shortness of breath.  Cardiovascular: Positive for chest pain and leg swelling (BIlateral).  Gastrointestinal: Positive for abdominal pain (Upper quadrant, radiating). Negative for diarrhea.  Genitourinary: Negative.   Musculoskeletal: Positive for joint pain.  Skin: Negative.   Neurological: Positive for weakness.  Endo/Heme/Allergies: Negative.   Psychiatric/Behavioral: Negative.   All other systems reviewed and are negative. 14 point review of systems was performed and is negative except as detailed under history of present illness and above   PHYSICAL EXAMINATION Vitals with BMI 06/20/2016  Height    Weight 122 lbs 5 oz  BMI   Systolic 94  Diastolic 44  Pulse 66  Respirations 18  ECOG PERFORMANCE STATUS: 2 - Symptomatic, <50% confined to bed   Physical Exam  Constitutional: She is oriented to person, place, and time and well-developed, well-nourished, and in no distress.  In wheelchair  HENT:  Head: Normocephalic and atraumatic.  Eyes: EOM are normal. Pupils are equal, round, and reactive to light. No scleral icterus.  Neck: Normal range of motion. Neck supple.  Cardiovascular: Normal rate, regular rhythm and normal heart sounds.   Pulmonary/Chest: Effort normal and breath sounds normal.  Abdominal: Soft. Bowel sounds are normal. She exhibits no distension. There is no tenderness. There is no rebound.  RLQ ostomy bag with brown stool  Musculoskeletal: Normal range of motion. She exhibits edema.  Bilateral 2+ ankle edema  Lymphadenopathy:    She has no cervical adenopathy.  Neurological: She is alert and oriented to person, place, and time. Gait normal.  Skin: Skin is warm and dry.  Nursing note and vitals reviewed.   LABORATORY DATA: I have reviewed the data as listed.  Results for MAYMUNAH, STEGEMANN (MRN [id number]) as of 06/20/2016 11:33  Ref. Range 06/15/2016 06:54 06/16/2016 10:13  COMPREHENSIVE METABOLIC PANEL Unknown Rpt (A)   Sodium Latest Ref Range: 135 - 145 mmol/L 140   Potassium Latest Ref Range: 3.5 - 5.1 mmol/L 3.7   Chloride Latest Ref Range: 101 - 111 mmol/L 116 (H)   CO2 Latest Ref Range: 22 - 32 mmol/L 19 (L)   Glucose Latest Ref Range: 65 - 99 mg/dL 108 (H)   BUN Latest Ref Range: 6 - 20 mg/dL 24 (H)   Creatinine Latest Ref Range: 0.44 - 1.00 mg/dL 0.91   Calcium Latest Ref Range: 8.9 - 10.3 mg/dL 8.4 (L)   Anion gap Latest Ref Range: 5 - 15  5   Alkaline Phosphatase Latest Ref Range: 38 - 126 U/L 103   Albumin Latest Ref Range: 3.5 - 5.0 g/dL 2.0 (L)   AST Latest Ref Range: 15 - 41 U/L 16   ALT Latest Ref Range: 14 - 54 U/L 12 (L)   Total Protein Latest  Ref Range: 6.5 - 8.1 g/dL 5.6 (L)   Total Bilirubin Latest Ref Range: 0.3 - 1.2 mg/dL 0.4   EGFR (African American) Latest Ref Range: >60 mL/min >60   EGFR (Non-African Amer.) Latest Ref Range: >60 mL/min 56 (L)   Vitamin B12 Latest Ref Range: 180 - 914 pg/mL  116 (L)  WBC Latest Ref Range: 4.0 - 10.5 K/uL 7.7   RBC Latest Ref Range: 3.87 - 5.11 MIL/uL 2.80 (L)   Hemoglobin Latest Ref Range: 12.0 - 15.0 g/dL 8.1 (L)   HCT Latest Ref Range: 36.0 - 46.0 % 24.3 (L)   MCV Latest Ref Range: 78.0 - 100.0 fL 86.8   MCH Latest Ref Range: 26.0 - 34.0 pg 28.9   MCHC Latest Ref Range: 30.0 - 36.0  g/dL 33.3   RDW Latest Ref Range: 11.5 - 15.5 % 17.6 (H)   Platelets Latest Ref Range: 150 - 400 K/uL 270     Results for IDALYS, KONECNY (MRN [id number]) as of 06/20/2016 11:33  Ref. Range 05/12/2016 11:27 05/19/2016 09:49 06/13/2016 10:11  CEA Latest Ref Range: 0.0 - 4.7 ng/mL 768.3 (H) 585.4 (H) 615.6 (H)    RADIOLOGY: I have reviewed the images detailed below and agree with the results:  CT chest, abdomen, pelvis with contrast 01/09/2016 IMPRESSION: Chest Impression: 1. Interval increase in size and prominence of RIGHT upper lobe pulmonary nodule measuring 4 mm is highly concerning for pulmonary metastasis. 2. No mediastinal lymphadenopathy  Abdomen / Pelvis Impression: 1. New enhancing lesion in the LEFT lateral hepatic lobe consistent with progression of hepatic metastasis. 2. Stable mass along the descending colon was hypermetabolic on comparison PET-CT scan. 3. RIGHT lower quadrant ileostomy without complication.  ASSESSMENT and THERAPY PLAN:  Colon carcinoma with liver mets currently on single agent palliative irinotecan. Evidence of progression of liver mets on recent restaging scans in April 2018. Hypoalbuminemia Failure to thrive Declining performance status   PLAN: Today I discussed the patient's recent restaging CT scans with the patient and her daughter in detail. This showed  continued progression of liver metastases with enlargement of her known liver mets and development of new ones. We also discussed her recent labs. The patient is anemic and has decreased albumin levels which has significantly decreased in less than a month. She also has decreased appetite and worsening pain and fatigue.  Given her overall clinical picture, I have advised the patient that I do not believe she can tolerate any more chemotherapy and I have encouraged the patient to focus on quality of life to enjoy her remaining time with her loved ones. I will discontinue the rest of her sheduled chemotherapy with irinotecan.  I offered to refer the patient for a second opinion. She has declined at this time.  I recommended the patient consider home Hospice care to more closely manage her symptoms and pain as needed. The patient is in agreement. I will refer her to these services.   The patient declined IV fluids today.  I will prescribe Lasix PRN today for the patient's lower extremity edema.  I encouraged the patient and her daughter that I will be available for follow up as needed.   All questions were answered. The patient knows to call the clinic with any problems, questions or concerns. We can certainly see the patient much sooner if necessary.  This document serves as a record of services personally performed by Twana First, MD. It was created on her behalf by Maryla Morrow, a trained medical scribe. The creation of this record is based on the scribe's personal observations and the provider's statements to them. This document has been checked and approved by the attending provider.  I have reviewed the above documentation for accuracy and completeness, and I agree with the above.  This document was electronically signed by:  Twana First, MD 06/20/16

## 2016-06-23 ENCOUNTER — Encounter (HOSPITAL_COMMUNITY): Payer: Self-pay | Admitting: Oncology

## 2016-06-23 DIAGNOSIS — C787 Secondary malignant neoplasm of liver and intrahepatic bile duct: Secondary | ICD-10-CM | POA: Diagnosis not present

## 2016-06-23 DIAGNOSIS — N183 Chronic kidney disease, stage 3 (moderate): Secondary | ICD-10-CM | POA: Diagnosis not present

## 2016-06-23 DIAGNOSIS — E1122 Type 2 diabetes mellitus with diabetic chronic kidney disease: Secondary | ICD-10-CM | POA: Diagnosis not present

## 2016-06-23 DIAGNOSIS — C189 Malignant neoplasm of colon, unspecified: Secondary | ICD-10-CM | POA: Diagnosis not present

## 2016-06-23 DIAGNOSIS — R530 Neoplastic (malignant) related fatigue: Secondary | ICD-10-CM | POA: Diagnosis not present

## 2016-06-23 DIAGNOSIS — I129 Hypertensive chronic kidney disease with stage 1 through stage 4 chronic kidney disease, or unspecified chronic kidney disease: Secondary | ICD-10-CM | POA: Diagnosis not present

## 2016-06-23 NOTE — Progress Notes (Unsigned)
06/20/16-Faxed referral/pt records to [facility].

## 2016-06-25 DIAGNOSIS — N183 Chronic kidney disease, stage 3 (moderate): Secondary | ICD-10-CM | POA: Diagnosis not present

## 2016-06-25 DIAGNOSIS — C189 Malignant neoplasm of colon, unspecified: Secondary | ICD-10-CM | POA: Diagnosis not present

## 2016-06-25 DIAGNOSIS — E1122 Type 2 diabetes mellitus with diabetic chronic kidney disease: Secondary | ICD-10-CM | POA: Diagnosis not present

## 2016-06-25 DIAGNOSIS — C787 Secondary malignant neoplasm of liver and intrahepatic bile duct: Secondary | ICD-10-CM | POA: Diagnosis not present

## 2016-06-25 DIAGNOSIS — I129 Hypertensive chronic kidney disease with stage 1 through stage 4 chronic kidney disease, or unspecified chronic kidney disease: Secondary | ICD-10-CM | POA: Diagnosis not present

## 2016-06-25 DIAGNOSIS — R530 Neoplastic (malignant) related fatigue: Secondary | ICD-10-CM | POA: Diagnosis not present

## 2016-06-27 ENCOUNTER — Ambulatory Visit (HOSPITAL_COMMUNITY): Payer: Medicare Other

## 2016-06-30 DIAGNOSIS — C787 Secondary malignant neoplasm of liver and intrahepatic bile duct: Secondary | ICD-10-CM | POA: Diagnosis not present

## 2016-06-30 DIAGNOSIS — I129 Hypertensive chronic kidney disease with stage 1 through stage 4 chronic kidney disease, or unspecified chronic kidney disease: Secondary | ICD-10-CM | POA: Diagnosis not present

## 2016-06-30 DIAGNOSIS — E1122 Type 2 diabetes mellitus with diabetic chronic kidney disease: Secondary | ICD-10-CM | POA: Diagnosis not present

## 2016-06-30 DIAGNOSIS — R530 Neoplastic (malignant) related fatigue: Secondary | ICD-10-CM | POA: Diagnosis not present

## 2016-06-30 DIAGNOSIS — C189 Malignant neoplasm of colon, unspecified: Secondary | ICD-10-CM | POA: Diagnosis not present

## 2016-06-30 DIAGNOSIS — N183 Chronic kidney disease, stage 3 (moderate): Secondary | ICD-10-CM | POA: Diagnosis not present

## 2016-07-01 DIAGNOSIS — E079 Disorder of thyroid, unspecified: Secondary | ICD-10-CM | POA: Diagnosis not present

## 2016-07-01 DIAGNOSIS — N183 Chronic kidney disease, stage 3 (moderate): Secondary | ICD-10-CM | POA: Diagnosis not present

## 2016-07-01 DIAGNOSIS — C787 Secondary malignant neoplasm of liver and intrahepatic bile duct: Secondary | ICD-10-CM | POA: Diagnosis not present

## 2016-07-01 DIAGNOSIS — I447 Left bundle-branch block, unspecified: Secondary | ICD-10-CM | POA: Diagnosis not present

## 2016-07-01 DIAGNOSIS — I509 Heart failure, unspecified: Secondary | ICD-10-CM | POA: Diagnosis not present

## 2016-07-01 DIAGNOSIS — D6481 Anemia due to antineoplastic chemotherapy: Secondary | ICD-10-CM | POA: Diagnosis not present

## 2016-07-01 DIAGNOSIS — C189 Malignant neoplasm of colon, unspecified: Secondary | ICD-10-CM | POA: Diagnosis not present

## 2016-07-01 DIAGNOSIS — T451X5A Adverse effect of antineoplastic and immunosuppressive drugs, initial encounter: Secondary | ICD-10-CM | POA: Diagnosis not present

## 2016-07-03 DIAGNOSIS — N183 Chronic kidney disease, stage 3 (moderate): Secondary | ICD-10-CM | POA: Diagnosis not present

## 2016-07-03 DIAGNOSIS — R530 Neoplastic (malignant) related fatigue: Secondary | ICD-10-CM | POA: Diagnosis not present

## 2016-07-03 DIAGNOSIS — I129 Hypertensive chronic kidney disease with stage 1 through stage 4 chronic kidney disease, or unspecified chronic kidney disease: Secondary | ICD-10-CM | POA: Diagnosis not present

## 2016-07-03 DIAGNOSIS — C787 Secondary malignant neoplasm of liver and intrahepatic bile duct: Secondary | ICD-10-CM | POA: Diagnosis not present

## 2016-07-03 DIAGNOSIS — C189 Malignant neoplasm of colon, unspecified: Secondary | ICD-10-CM | POA: Diagnosis not present

## 2016-07-03 DIAGNOSIS — E1122 Type 2 diabetes mellitus with diabetic chronic kidney disease: Secondary | ICD-10-CM | POA: Diagnosis not present

## 2016-07-04 ENCOUNTER — Ambulatory Visit (HOSPITAL_COMMUNITY): Payer: Medicare Other

## 2016-07-15 DIAGNOSIS — C189 Malignant neoplasm of colon, unspecified: Secondary | ICD-10-CM | POA: Diagnosis not present

## 2016-07-16 ENCOUNTER — Other Ambulatory Visit (HOSPITAL_COMMUNITY): Payer: Self-pay | Admitting: Oncology

## 2016-07-16 DIAGNOSIS — Z6822 Body mass index (BMI) 22.0-22.9, adult: Secondary | ICD-10-CM | POA: Diagnosis not present

## 2016-07-16 DIAGNOSIS — C787 Secondary malignant neoplasm of liver and intrahepatic bile duct: Secondary | ICD-10-CM | POA: Diagnosis not present

## 2016-07-16 DIAGNOSIS — Z8505 Personal history of malignant neoplasm of liver: Secondary | ICD-10-CM | POA: Diagnosis not present

## 2016-07-16 DIAGNOSIS — R531 Weakness: Secondary | ICD-10-CM | POA: Diagnosis not present

## 2016-07-16 DIAGNOSIS — N179 Acute kidney failure, unspecified: Secondary | ICD-10-CM | POA: Diagnosis not present

## 2016-07-16 DIAGNOSIS — C189 Malignant neoplasm of colon, unspecified: Secondary | ICD-10-CM | POA: Diagnosis not present

## 2016-08-12 DIAGNOSIS — R531 Weakness: Secondary | ICD-10-CM | POA: Diagnosis not present

## 2016-08-12 DIAGNOSIS — C787 Secondary malignant neoplasm of liver and intrahepatic bile duct: Secondary | ICD-10-CM | POA: Diagnosis not present

## 2016-08-12 DIAGNOSIS — C189 Malignant neoplasm of colon, unspecified: Secondary | ICD-10-CM | POA: Diagnosis not present

## 2016-08-15 DIAGNOSIS — C189 Malignant neoplasm of colon, unspecified: Secondary | ICD-10-CM | POA: Diagnosis not present

## 2016-08-15 DIAGNOSIS — C787 Secondary malignant neoplasm of liver and intrahepatic bile duct: Secondary | ICD-10-CM | POA: Diagnosis not present

## 2016-08-15 DIAGNOSIS — R197 Diarrhea, unspecified: Secondary | ICD-10-CM | POA: Diagnosis not present

## 2016-08-15 DIAGNOSIS — M150 Primary generalized (osteo)arthritis: Secondary | ICD-10-CM | POA: Diagnosis not present

## 2016-08-15 DIAGNOSIS — E079 Disorder of thyroid, unspecified: Secondary | ICD-10-CM | POA: Diagnosis not present

## 2016-08-15 DIAGNOSIS — K220 Achalasia of cardia: Secondary | ICD-10-CM | POA: Diagnosis not present

## 2016-08-18 ENCOUNTER — Encounter (INDEPENDENT_AMBULATORY_CARE_PROVIDER_SITE_OTHER): Payer: Self-pay | Admitting: Internal Medicine

## 2016-08-19 DIAGNOSIS — I10 Essential (primary) hypertension: Secondary | ICD-10-CM | POA: Diagnosis not present

## 2016-08-19 DIAGNOSIS — E039 Hypothyroidism, unspecified: Secondary | ICD-10-CM | POA: Diagnosis not present

## 2016-08-19 DIAGNOSIS — E119 Type 2 diabetes mellitus without complications: Secondary | ICD-10-CM | POA: Diagnosis not present

## 2016-08-19 DIAGNOSIS — M545 Low back pain: Secondary | ICD-10-CM | POA: Diagnosis not present

## 2016-08-19 DIAGNOSIS — Z8505 Personal history of malignant neoplasm of liver: Secondary | ICD-10-CM | POA: Diagnosis not present

## 2016-08-19 DIAGNOSIS — R634 Abnormal weight loss: Secondary | ICD-10-CM | POA: Diagnosis not present

## 2016-08-19 DIAGNOSIS — I200 Unstable angina: Secondary | ICD-10-CM | POA: Diagnosis not present

## 2016-08-19 DIAGNOSIS — Z0000 Encounter for general adult medical examination without abnormal findings: Secondary | ICD-10-CM | POA: Diagnosis not present

## 2016-08-20 ENCOUNTER — Ambulatory Visit (INDEPENDENT_AMBULATORY_CARE_PROVIDER_SITE_OTHER): Payer: Medicare Other | Admitting: Internal Medicine

## 2016-08-20 ENCOUNTER — Encounter (INDEPENDENT_AMBULATORY_CARE_PROVIDER_SITE_OTHER): Payer: Self-pay | Admitting: Internal Medicine

## 2016-08-20 VITALS — BP 110/52 | HR 60 | Temp 98.3°F | Ht 60.0 in | Wt 116.3 lb

## 2016-08-20 DIAGNOSIS — C189 Malignant neoplasm of colon, unspecified: Secondary | ICD-10-CM

## 2016-08-20 DIAGNOSIS — R1319 Other dysphagia: Secondary | ICD-10-CM

## 2016-08-20 DIAGNOSIS — R1310 Dysphagia, unspecified: Secondary | ICD-10-CM | POA: Diagnosis not present

## 2016-08-20 DIAGNOSIS — C220 Liver cell carcinoma: Secondary | ICD-10-CM | POA: Diagnosis not present

## 2016-08-20 NOTE — Patient Instructions (Signed)
Family will decide if she wants a G tube

## 2016-08-20 NOTE — Progress Notes (Signed)
Subjective:    Patient ID: Karen [name], female    DOB: 11-18-1929, 83 y.o.   MRN: [id number]  HPI Presents today with c/o dysphagia. She is choking on pills. She has to crush her pills up. Symptoms for several weeks. She says nothing taste right.  She take L[place]f two pills twice a day. Receives chemo at [facility].  Daughter states chemo is palliative care.  She has weight loss.  Hx of colon cancer with mets to the liver.  01/18/2016 EGD injection of botulism for achalasia: Impression:               - Achalasia. Dilated. Injected with botulinum toxin.                           - Z-line regular, 34 cm from the incisors.                           - 2 cm hiatal hernia.                           - Normal stomach.                           - Normal duodenal bulb and second portion of the                            duodenum.                           - No specimens collected. Review of Systems Past Medical History:  Diagnosis Date  . Abdominal adhesions   . Achalasia    a. s/p botox injection for achalasia.  . Anemia   . Arthritis   . Blood transfusion   . Breast lump   . Chest pain    a. 2002 Cath: nl cors;  b. 2009 aden mv: nl;  c. 05/2009 Echo: nl. d. 2014: normal nuclear stress test, EF 69%.  . CKD (chronic kidney disease), stage III   . Colon cancer ([place])    a. recurrence 2016 with liver mets -  percutaneous liver biopsy and thermal ablation of a liver metastasis by interventional radiology..  . Colon polyp   . Depression   . DVT of deep femoral vein ([place])   . Essential hypertension   . Fibromyalgia   . GERD (gastroesophageal reflux disease)   . Headache   . Hearing loss   . Hernia   . History of asthma   . History of bladder cancer   . History of PSVT (paroxysmal supraventricular tachycardia)   . Hyperlipidemia   . Hypothyroidism   . Ileostomy present ([place])   . Iron deficiency anemia due to chronic blood loss   . LBBB (left bundle branch block)   . Obstruction of  intestine or colon ([place]) 06/2014  . Sinus bradycardia    a. HR 30s in 08/2014 requiring holding of digoxin.  . Type 2 diabetes mellitus ([place])   . Vision abnormalities   . Wears dentures     Past Surgical History:  Procedure Laterality Date  . ABDOMINAL HYSTERECTOMY    . APPENDECTOMY    . BOTOX INJECTION N/A 09/01/2013   Procedure: BOTOX INJECTION;  Surgeon: Rogene Houston, MD;  Location: [facility];  Service: Endoscopy;  Laterality: N/A;  . BREAST SURGERY     right breast biopsy  . CARDIAC CATHETERIZATION  12/10/2000   THE LEFT VENTRICLE IS MILDY DILATED. THERE IS MILD TO MODERATE DIFFUSE HYPOKINESIS WITH EF 35%  . CATARACT EXTRACTION W/PHACO  11/13/2011   Procedure: CATARACT EXTRACTION PHACO AND INTRAOCULAR LENS PLACEMENT (IOC);  Surgeon: Tonny Branch, MD;  Location: [facility];  Service: Ophthalmology;  Laterality: Right;  CDE 12.26  . CATARACT EXTRACTION W/PHACO  12/08/2011   Procedure: CATARACT EXTRACTION PHACO AND INTRAOCULAR LENS PLACEMENT (IOC);  Surgeon: Tonny Branch, MD;  Location: [facility];  Service: Ophthalmology;  Laterality: Left;  CDE 15.11  . CHOLECYSTECTOMY    . COLON CANCER SURGERY    . COLON SURGERY    . COLONOSCOPY  09/26/2011   Procedure: COLONOSCOPY;  Surgeon: Rogene Houston, MD;  Location: [facility];  Service: Endoscopy;  Laterality: N/A;  215  . CORONARY ANGIOPLASTY    . ESOPHAGEAL DILATION N/A 01/18/2016   Procedure: ESOPHAGEAL DILATION;  Surgeon: Rogene Houston, MD;  [facility]: [facility];  Service: Endoscopy;  Laterality: N/A;  . ESOPHAGOGASTRODUODENOSCOPY  02/13/2011   Procedure: ESOPHAGOGASTRODUODENOSCOPY (EGD);  Surgeon: Rogene Houston, MD;  [facility];  Service: Endoscopy;  Laterality: N/A;  1030  . ESOPHAGOGASTRODUODENOSCOPY N/A 09/01/2013   Procedure: ESOPHAGOGASTRODUODENOSCOPY (EGD);  Surgeon: Rogene Houston, MD;  [facility];  Service: Endoscopy;  Laterality: N/A;  830  . ESOPHAGOGASTRODUODENOSCOPY N/A 01/18/2016   Procedure:  ESOPHAGOGASTRODUODENOSCOPY (EGD);  Surgeon: Rogene Houston, MD;  [facility];  Service: Endoscopy;  Laterality: N/A;  1120  . EYE SURGERY  2014   catarac surgery  . ILEOSTOMY    . ILEOSTOMY    . JOINT REPLACEMENT Left 2008  . liver biopsy and ablation  09/01/14  . ovarian cystectomy 1955    . PORTACATH PLACEMENT Left 02/08/2016   Procedure: INSERTION PORT-A-CATH;  Surgeon: Aviva Signs, MD;  Location: [facility];  Service: General;  Laterality: Left;  . ROTATOR CUFF REPAIR    . TONSILLECTOMY      Allergies  Allergen Reactions  . Bactrim [Sulfamethoxazole-Trimethoprim] Shortness Of Breath and Other (See Comments)    Hurting all over  . Codeine Palpitations and Other (See Comments)    Heart races  . Demerol Palpitations and Other (See Comments)    tachycardia  . Lidocaine Hcl Other (See Comments)    tachycardia  . Morphine And Related Palpitations    Tachycardia   . Dilaudid [Hydromorphone Hcl] Other (See Comments)    Hallucinations  . Nitrofurantoin Monohyd Macro Other (See Comments)    Unknown   . Lisinopril Cough  . Ramipril Cough    Current Outpatient Prescriptions on File Prior to Visit  Medication Sig Dispense Refill  . ALPRAZolam (XANAX) 0.5 MG tablet Take 0.5 mg by mouth at bedtime.     Marland Kitchen aspirin EC 325 MG tablet Take 1 tablet (325 mg total) by mouth daily. 30 tablet 0  . carvedilol (COREG) 12.5 MG tablet TAKE ONE & ONE-HALF TABLETS BY MOUTH TWICE DAILY WITH  A  MEAL 90 tablet 6  . dicyclomine (BENTYL) 10 MG capsule Take 1 capsule (10 mg total) by mouth 4 (four) times daily as needed for spasms. 60 capsule 1  . diphenoxylate-atropine (LOMOTIL) 2.5-0.025 MG tablet Take 1 tablet by mouth as needed.     . fentaNYL (DURAGESIC - DOSED MCG/HR) 25 MCG/HR patch Place 1 patch (  25 mcg total) onto the skin every 3 (three) days. 10 patch 0  . HYDROcodone-acetaminophen (NORCO) 10-325 MG tablet     . hyoscyamine (LEVSIN SL) 0.125 MG SL tablet Place 1 tablet (0.125 mg  total) under the tongue 3 (three) times daily as needed (lower abdominal pain). 90 tablet 2  . isosorbide mononitrate (IMDUR) 30 MG 24 hr tablet Take 15 mg by mouth daily.    Marland Kitchen levothyroxine (SYNTHROID, LEVOTHROID) 75 MCG tablet Take 75 mcg by mouth daily.     Marland Kitchen lidocaine-prilocaine (EMLA) cream Apply a quarter size amount to affected area 1 hour prior to coming to chemotherapy. (Patient taking differently: Apply a quarter size amount to affected area 1 hour prior to coming to chemotherapy as needed) 30 g 2  . magnesium oxide (MAG-OX) 400 (241.3 Mg) MG tablet Take 1 tablet (400 mg total) by mouth 3 (three) times daily. (Patient taking differently: Take 1 tablet by mouth 2 (two) times daily. ) 120 tablet 1  . Multiple Vitamins-Minerals (CENTRUM WOMEN PO) Take 1 tablet by mouth daily.     Marland Kitchen NITROSTAT 0.4 MG SL tablet DISSOLVE ONE TABLET UNDER THE TONGUE EVERY 5 MINUTES AS NEEDED FOR CHEST PAIN.  DO NOT EXCEED A TOTAL OF 3 DOSES IN 15 MINUTES 25 tablet 0  . ondansetron (ZOFRAN-ODT) 8 MG disintegrating tablet Take 1 tablet (8 mg total) by mouth every 8 (eight) hours as needed for nausea or vomiting. 60 tablet 3  . pantoprazole (PROTONIX) 40 MG tablet Take 40 mg by mouth daily.      No current facility-administered medications on file prior to visit.         Objective:   Physical Exam Blood pressure (!) 110/52, pulse 60, temperature 98.3 F (36.8 C), height 5' (1.524 m), weight 116 lb 4.8 oz (52.8 kg).  Alert and oriented. Skin warm and dry. Oral mucosa is moist.   . Sclera anicteric, conjunctivae is pink. Thyroid not enlarged. No cervical lymphadenopathy. Lungs clear. Heart regular rate and rhythm.  Abdomen is soft. Bowel sounds are positive. No hepatomegaly. Abdominal mass felt ruq  Slight   Tenderness to ruq.  No edema to lower extremities.   Ileostomy in place        Assessment & Plan:  Dysphagia. Hx of achalasia.  Hx of colon cancer with mets to the liver. Receives chemo (palliative care  at [facility]) I have asked family to go home and discuss if they    want to proceed with G tube. (This is per Dr. Olevia Perches notes).  May try using a blender to pureed food. Family will et me know.

## 2016-08-21 DIAGNOSIS — C787 Secondary malignant neoplasm of liver and intrahepatic bile duct: Secondary | ICD-10-CM | POA: Diagnosis not present

## 2016-08-21 DIAGNOSIS — E1122 Type 2 diabetes mellitus with diabetic chronic kidney disease: Secondary | ICD-10-CM | POA: Diagnosis not present

## 2016-08-21 DIAGNOSIS — R530 Neoplastic (malignant) related fatigue: Secondary | ICD-10-CM | POA: Diagnosis not present

## 2016-08-21 DIAGNOSIS — C189 Malignant neoplasm of colon, unspecified: Secondary | ICD-10-CM | POA: Diagnosis not present

## 2016-08-22 DIAGNOSIS — M810 Age-related osteoporosis without current pathological fracture: Secondary | ICD-10-CM | POA: Diagnosis not present

## 2016-08-22 DIAGNOSIS — M5136 Other intervertebral disc degeneration, lumbar region: Secondary | ICD-10-CM | POA: Diagnosis not present

## 2016-08-22 DIAGNOSIS — M47816 Spondylosis without myelopathy or radiculopathy, lumbar region: Secondary | ICD-10-CM | POA: Diagnosis not present

## 2016-08-22 DIAGNOSIS — C189 Malignant neoplasm of colon, unspecified: Secondary | ICD-10-CM | POA: Diagnosis not present

## 2016-08-22 DIAGNOSIS — C787 Secondary malignant neoplasm of liver and intrahepatic bile duct: Secondary | ICD-10-CM | POA: Diagnosis not present

## 2016-08-22 DIAGNOSIS — Z96642 Presence of left artificial hip joint: Secondary | ICD-10-CM | POA: Diagnosis not present

## 2016-08-22 DIAGNOSIS — M1611 Unilateral primary osteoarthritis, right hip: Secondary | ICD-10-CM | POA: Diagnosis not present

## 2016-08-27 DIAGNOSIS — R110 Nausea: Secondary | ICD-10-CM | POA: Diagnosis not present

## 2016-08-27 DIAGNOSIS — D6481 Anemia due to antineoplastic chemotherapy: Secondary | ICD-10-CM | POA: Diagnosis not present

## 2016-08-27 DIAGNOSIS — C787 Secondary malignant neoplasm of liver and intrahepatic bile duct: Secondary | ICD-10-CM | POA: Diagnosis not present

## 2016-08-27 DIAGNOSIS — R197 Diarrhea, unspecified: Secondary | ICD-10-CM | POA: Diagnosis not present

## 2016-08-27 DIAGNOSIS — T451X5A Adverse effect of antineoplastic and immunosuppressive drugs, initial encounter: Secondary | ICD-10-CM | POA: Diagnosis not present

## 2016-08-27 DIAGNOSIS — C189 Malignant neoplasm of colon, unspecified: Secondary | ICD-10-CM | POA: Diagnosis not present

## 2016-08-28 DIAGNOSIS — R197 Diarrhea, unspecified: Secondary | ICD-10-CM | POA: Diagnosis not present

## 2016-08-28 DIAGNOSIS — D6481 Anemia due to antineoplastic chemotherapy: Secondary | ICD-10-CM | POA: Diagnosis not present

## 2016-08-28 DIAGNOSIS — C787 Secondary malignant neoplasm of liver and intrahepatic bile duct: Secondary | ICD-10-CM | POA: Diagnosis not present

## 2016-08-28 DIAGNOSIS — C189 Malignant neoplasm of colon, unspecified: Secondary | ICD-10-CM | POA: Diagnosis not present

## 2016-08-28 DIAGNOSIS — Z95828 Presence of other vascular implants and grafts: Secondary | ICD-10-CM | POA: Diagnosis not present

## 2016-08-28 DIAGNOSIS — T451X5A Adverse effect of antineoplastic and immunosuppressive drugs, initial encounter: Secondary | ICD-10-CM | POA: Diagnosis not present

## 2016-08-29 DIAGNOSIS — C787 Secondary malignant neoplasm of liver and intrahepatic bile duct: Secondary | ICD-10-CM | POA: Diagnosis not present

## 2016-08-29 DIAGNOSIS — R197 Diarrhea, unspecified: Secondary | ICD-10-CM | POA: Diagnosis not present

## 2016-08-29 DIAGNOSIS — M150 Primary generalized (osteo)arthritis: Secondary | ICD-10-CM | POA: Diagnosis not present

## 2016-08-29 DIAGNOSIS — C189 Malignant neoplasm of colon, unspecified: Secondary | ICD-10-CM | POA: Diagnosis not present

## 2016-08-29 DIAGNOSIS — K220 Achalasia of cardia: Secondary | ICD-10-CM | POA: Diagnosis not present

## 2016-09-01 DIAGNOSIS — C787 Secondary malignant neoplasm of liver and intrahepatic bile duct: Secondary | ICD-10-CM | POA: Diagnosis not present

## 2016-09-01 DIAGNOSIS — N183 Chronic kidney disease, stage 3 (moderate): Secondary | ICD-10-CM | POA: Diagnosis not present

## 2016-09-01 DIAGNOSIS — Z87891 Personal history of nicotine dependence: Secondary | ICD-10-CM | POA: Diagnosis not present

## 2016-09-01 DIAGNOSIS — Z7982 Long term (current) use of aspirin: Secondary | ICD-10-CM | POA: Diagnosis not present

## 2016-09-01 DIAGNOSIS — I509 Heart failure, unspecified: Secondary | ICD-10-CM | POA: Diagnosis not present

## 2016-09-01 DIAGNOSIS — Z66 Do not resuscitate: Secondary | ICD-10-CM | POA: Diagnosis not present

## 2016-09-01 DIAGNOSIS — R109 Unspecified abdominal pain: Secondary | ICD-10-CM | POA: Diagnosis not present

## 2016-09-01 DIAGNOSIS — M79604 Pain in right leg: Secondary | ICD-10-CM | POA: Diagnosis not present

## 2016-09-01 DIAGNOSIS — R102 Pelvic and perineal pain: Secondary | ICD-10-CM | POA: Diagnosis not present

## 2016-09-01 DIAGNOSIS — Z79899 Other long term (current) drug therapy: Secondary | ICD-10-CM | POA: Diagnosis not present

## 2016-09-01 DIAGNOSIS — M150 Primary generalized (osteo)arthritis: Secondary | ICD-10-CM | POA: Diagnosis not present

## 2016-09-01 DIAGNOSIS — M25551 Pain in right hip: Secondary | ICD-10-CM | POA: Diagnosis not present

## 2016-09-01 DIAGNOSIS — E1122 Type 2 diabetes mellitus with diabetic chronic kidney disease: Secondary | ICD-10-CM | POA: Diagnosis not present

## 2016-09-01 DIAGNOSIS — M1991 Primary osteoarthritis, unspecified site: Secondary | ICD-10-CM | POA: Diagnosis not present

## 2016-09-01 DIAGNOSIS — K220 Achalasia of cardia: Secondary | ICD-10-CM | POA: Diagnosis not present

## 2016-09-01 DIAGNOSIS — I447 Left bundle-branch block, unspecified: Secondary | ICD-10-CM | POA: Diagnosis not present

## 2016-09-01 DIAGNOSIS — D6481 Anemia due to antineoplastic chemotherapy: Secondary | ICD-10-CM | POA: Diagnosis not present

## 2016-09-01 DIAGNOSIS — I471 Supraventricular tachycardia: Secondary | ICD-10-CM | POA: Diagnosis not present

## 2016-09-01 DIAGNOSIS — C189 Malignant neoplasm of colon, unspecified: Secondary | ICD-10-CM | POA: Diagnosis not present

## 2016-09-01 DIAGNOSIS — Z8052 Family history of malignant neoplasm of bladder: Secondary | ICD-10-CM | POA: Diagnosis not present

## 2016-09-01 DIAGNOSIS — E079 Disorder of thyroid, unspecified: Secondary | ICD-10-CM | POA: Diagnosis not present

## 2016-09-01 DIAGNOSIS — I130 Hypertensive heart and chronic kidney disease with heart failure and stage 1 through stage 4 chronic kidney disease, or unspecified chronic kidney disease: Secondary | ICD-10-CM | POA: Diagnosis not present

## 2016-09-01 DIAGNOSIS — R1310 Dysphagia, unspecified: Secondary | ICD-10-CM | POA: Diagnosis not present

## 2016-09-01 DIAGNOSIS — K219 Gastro-esophageal reflux disease without esophagitis: Secondary | ICD-10-CM | POA: Diagnosis not present

## 2016-09-01 DIAGNOSIS — Z803 Family history of malignant neoplasm of breast: Secondary | ICD-10-CM | POA: Diagnosis not present

## 2016-09-03 DIAGNOSIS — C787 Secondary malignant neoplasm of liver and intrahepatic bile duct: Secondary | ICD-10-CM | POA: Diagnosis not present

## 2016-09-03 DIAGNOSIS — C189 Malignant neoplasm of colon, unspecified: Secondary | ICD-10-CM | POA: Diagnosis not present

## 2016-09-03 DIAGNOSIS — R197 Diarrhea, unspecified: Secondary | ICD-10-CM | POA: Diagnosis not present

## 2016-09-05 DIAGNOSIS — R197 Diarrhea, unspecified: Secondary | ICD-10-CM | POA: Diagnosis not present

## 2016-09-05 DIAGNOSIS — C189 Malignant neoplasm of colon, unspecified: Secondary | ICD-10-CM | POA: Diagnosis not present

## 2016-09-05 DIAGNOSIS — C787 Secondary malignant neoplasm of liver and intrahepatic bile duct: Secondary | ICD-10-CM | POA: Diagnosis not present

## 2016-09-05 DIAGNOSIS — Z95828 Presence of other vascular implants and grafts: Secondary | ICD-10-CM | POA: Diagnosis not present

## 2016-09-08 DIAGNOSIS — I447 Left bundle-branch block, unspecified: Secondary | ICD-10-CM | POA: Diagnosis not present

## 2016-09-08 DIAGNOSIS — I471 Supraventricular tachycardia: Secondary | ICD-10-CM | POA: Diagnosis not present

## 2016-09-08 DIAGNOSIS — E039 Hypothyroidism, unspecified: Secondary | ICD-10-CM | POA: Diagnosis not present

## 2016-09-08 DIAGNOSIS — R1310 Dysphagia, unspecified: Secondary | ICD-10-CM | POA: Diagnosis not present

## 2016-09-08 DIAGNOSIS — Z8719 Personal history of other diseases of the digestive system: Secondary | ICD-10-CM | POA: Diagnosis not present

## 2016-09-08 DIAGNOSIS — K219 Gastro-esophageal reflux disease without esophagitis: Secondary | ICD-10-CM | POA: Diagnosis not present

## 2016-09-08 DIAGNOSIS — I110 Hypertensive heart disease with heart failure: Secondary | ICD-10-CM | POA: Diagnosis not present

## 2016-09-08 DIAGNOSIS — I5030 Unspecified diastolic (congestive) heart failure: Secondary | ICD-10-CM | POA: Diagnosis not present

## 2016-09-08 DIAGNOSIS — Z8551 Personal history of malignant neoplasm of bladder: Secondary | ICD-10-CM | POA: Diagnosis not present

## 2016-09-08 DIAGNOSIS — M479 Spondylosis, unspecified: Secondary | ICD-10-CM | POA: Diagnosis not present

## 2016-09-08 DIAGNOSIS — I2510 Atherosclerotic heart disease of native coronary artery without angina pectoris: Secondary | ICD-10-CM | POA: Diagnosis not present

## 2016-09-08 DIAGNOSIS — Z9221 Personal history of antineoplastic chemotherapy: Secondary | ICD-10-CM | POA: Diagnosis not present

## 2016-09-08 DIAGNOSIS — C189 Malignant neoplasm of colon, unspecified: Secondary | ICD-10-CM | POA: Diagnosis not present

## 2016-09-08 DIAGNOSIS — Z86718 Personal history of other venous thrombosis and embolism: Secondary | ICD-10-CM | POA: Diagnosis not present

## 2016-09-08 DIAGNOSIS — Z7982 Long term (current) use of aspirin: Secondary | ICD-10-CM | POA: Diagnosis not present

## 2016-09-08 DIAGNOSIS — Z87891 Personal history of nicotine dependence: Secondary | ICD-10-CM | POA: Diagnosis not present

## 2016-09-08 DIAGNOSIS — C787 Secondary malignant neoplasm of liver and intrahepatic bile duct: Secondary | ICD-10-CM | POA: Diagnosis not present

## 2016-09-08 DIAGNOSIS — N6019 Diffuse cystic mastopathy of unspecified breast: Secondary | ICD-10-CM | POA: Diagnosis not present

## 2016-09-08 DIAGNOSIS — R011 Cardiac murmur, unspecified: Secondary | ICD-10-CM | POA: Diagnosis not present

## 2016-09-08 DIAGNOSIS — Z515 Encounter for palliative care: Secondary | ICD-10-CM | POA: Diagnosis not present

## 2016-09-08 DIAGNOSIS — E119 Type 2 diabetes mellitus without complications: Secondary | ICD-10-CM | POA: Diagnosis not present

## 2016-09-08 DIAGNOSIS — D649 Anemia, unspecified: Secondary | ICD-10-CM | POA: Diagnosis not present

## 2016-09-08 DIAGNOSIS — M1611 Unilateral primary osteoarthritis, right hip: Secondary | ICD-10-CM | POA: Diagnosis not present

## 2016-09-08 DIAGNOSIS — R10819 Abdominal tenderness, unspecified site: Secondary | ICD-10-CM | POA: Diagnosis not present

## 2016-09-09 DIAGNOSIS — I509 Heart failure, unspecified: Secondary | ICD-10-CM | POA: Diagnosis not present

## 2016-09-09 DIAGNOSIS — I200 Unstable angina: Secondary | ICD-10-CM | POA: Diagnosis not present

## 2016-09-09 DIAGNOSIS — K219 Gastro-esophageal reflux disease without esophagitis: Secondary | ICD-10-CM | POA: Diagnosis not present

## 2016-09-09 DIAGNOSIS — C787 Secondary malignant neoplasm of liver and intrahepatic bile duct: Secondary | ICD-10-CM | POA: Diagnosis not present

## 2016-09-09 DIAGNOSIS — C189 Malignant neoplasm of colon, unspecified: Secondary | ICD-10-CM | POA: Diagnosis not present

## 2016-09-10 DIAGNOSIS — C787 Secondary malignant neoplasm of liver and intrahepatic bile duct: Secondary | ICD-10-CM | POA: Diagnosis not present

## 2016-09-10 DIAGNOSIS — C189 Malignant neoplasm of colon, unspecified: Secondary | ICD-10-CM | POA: Diagnosis not present

## 2016-09-12 DIAGNOSIS — Z95828 Presence of other vascular implants and grafts: Secondary | ICD-10-CM | POA: Diagnosis not present

## 2016-09-12 DIAGNOSIS — R197 Diarrhea, unspecified: Secondary | ICD-10-CM | POA: Diagnosis not present

## 2016-09-12 DIAGNOSIS — C189 Malignant neoplasm of colon, unspecified: Secondary | ICD-10-CM | POA: Diagnosis not present

## 2016-09-12 DIAGNOSIS — C787 Secondary malignant neoplasm of liver and intrahepatic bile duct: Secondary | ICD-10-CM | POA: Diagnosis not present

## 2016-09-15 DIAGNOSIS — C189 Malignant neoplasm of colon, unspecified: Secondary | ICD-10-CM | POA: Diagnosis not present

## 2016-09-15 DIAGNOSIS — Z95828 Presence of other vascular implants and grafts: Secondary | ICD-10-CM | POA: Diagnosis not present

## 2016-09-15 DIAGNOSIS — C787 Secondary malignant neoplasm of liver and intrahepatic bile duct: Secondary | ICD-10-CM | POA: Diagnosis not present

## 2016-09-17 DIAGNOSIS — C787 Secondary malignant neoplasm of liver and intrahepatic bile duct: Secondary | ICD-10-CM | POA: Diagnosis not present

## 2016-09-17 DIAGNOSIS — C189 Malignant neoplasm of colon, unspecified: Secondary | ICD-10-CM | POA: Diagnosis not present

## 2016-09-17 DIAGNOSIS — Z95828 Presence of other vascular implants and grafts: Secondary | ICD-10-CM | POA: Diagnosis not present

## 2016-09-17 DIAGNOSIS — R197 Diarrhea, unspecified: Secondary | ICD-10-CM | POA: Diagnosis not present

## 2016-09-19 DIAGNOSIS — C189 Malignant neoplasm of colon, unspecified: Secondary | ICD-10-CM | POA: Diagnosis not present

## 2016-09-19 DIAGNOSIS — C787 Secondary malignant neoplasm of liver and intrahepatic bile duct: Secondary | ICD-10-CM | POA: Diagnosis not present

## 2016-09-19 DIAGNOSIS — R197 Diarrhea, unspecified: Secondary | ICD-10-CM | POA: Diagnosis not present

## 2016-09-23 DIAGNOSIS — M25551 Pain in right hip: Secondary | ICD-10-CM | POA: Diagnosis not present

## 2016-09-23 DIAGNOSIS — Z79891 Long term (current) use of opiate analgesic: Secondary | ICD-10-CM | POA: Diagnosis not present

## 2016-09-23 DIAGNOSIS — Z95828 Presence of other vascular implants and grafts: Secondary | ICD-10-CM | POA: Diagnosis not present

## 2016-09-23 DIAGNOSIS — E1122 Type 2 diabetes mellitus with diabetic chronic kidney disease: Secondary | ICD-10-CM | POA: Diagnosis not present

## 2016-09-23 DIAGNOSIS — Z79890 Hormone replacement therapy (postmenopausal): Secondary | ICD-10-CM | POA: Diagnosis not present

## 2016-09-23 DIAGNOSIS — R531 Weakness: Secondary | ICD-10-CM | POA: Diagnosis not present

## 2016-09-23 DIAGNOSIS — C787 Secondary malignant neoplasm of liver and intrahepatic bile duct: Secondary | ICD-10-CM | POA: Diagnosis not present

## 2016-09-23 DIAGNOSIS — G8929 Other chronic pain: Secondary | ICD-10-CM | POA: Diagnosis not present

## 2016-09-23 DIAGNOSIS — Z9289 Personal history of other medical treatment: Secondary | ICD-10-CM | POA: Diagnosis not present

## 2016-09-23 DIAGNOSIS — E039 Hypothyroidism, unspecified: Secondary | ICD-10-CM | POA: Diagnosis not present

## 2016-09-23 DIAGNOSIS — I2510 Atherosclerotic heart disease of native coronary artery without angina pectoris: Secondary | ICD-10-CM | POA: Diagnosis not present

## 2016-09-23 DIAGNOSIS — I447 Left bundle-branch block, unspecified: Secondary | ICD-10-CM | POA: Diagnosis not present

## 2016-09-23 DIAGNOSIS — Z86718 Personal history of other venous thrombosis and embolism: Secondary | ICD-10-CM | POA: Diagnosis not present

## 2016-09-23 DIAGNOSIS — I471 Supraventricular tachycardia: Secondary | ICD-10-CM | POA: Diagnosis not present

## 2016-09-23 DIAGNOSIS — R197 Diarrhea, unspecified: Secondary | ICD-10-CM | POA: Diagnosis not present

## 2016-09-23 DIAGNOSIS — M1611 Unilateral primary osteoarthritis, right hip: Secondary | ICD-10-CM | POA: Diagnosis not present

## 2016-09-23 DIAGNOSIS — K219 Gastro-esophageal reflux disease without esophagitis: Secondary | ICD-10-CM | POA: Diagnosis not present

## 2016-09-23 DIAGNOSIS — Z87891 Personal history of nicotine dependence: Secondary | ICD-10-CM | POA: Diagnosis not present

## 2016-09-23 DIAGNOSIS — D631 Anemia in chronic kidney disease: Secondary | ICD-10-CM | POA: Diagnosis not present

## 2016-09-23 DIAGNOSIS — R112 Nausea with vomiting, unspecified: Secondary | ICD-10-CM | POA: Diagnosis not present

## 2016-09-23 DIAGNOSIS — C189 Malignant neoplasm of colon, unspecified: Secondary | ICD-10-CM | POA: Diagnosis not present

## 2016-09-23 DIAGNOSIS — N183 Chronic kidney disease, stage 3 (moderate): Secondary | ICD-10-CM | POA: Diagnosis not present

## 2016-09-23 DIAGNOSIS — R110 Nausea: Secondary | ICD-10-CM | POA: Diagnosis not present

## 2016-09-23 DIAGNOSIS — T451X5A Adverse effect of antineoplastic and immunosuppressive drugs, initial encounter: Secondary | ICD-10-CM | POA: Diagnosis not present

## 2016-09-23 DIAGNOSIS — I130 Hypertensive heart and chronic kidney disease with heart failure and stage 1 through stage 4 chronic kidney disease, or unspecified chronic kidney disease: Secondary | ICD-10-CM | POA: Diagnosis not present

## 2016-09-23 DIAGNOSIS — I509 Heart failure, unspecified: Secondary | ICD-10-CM | POA: Diagnosis not present

## 2016-09-29 DIAGNOSIS — C189 Malignant neoplasm of colon, unspecified: Secondary | ICD-10-CM | POA: Diagnosis not present

## 2016-09-29 DIAGNOSIS — C787 Secondary malignant neoplasm of liver and intrahepatic bile duct: Secondary | ICD-10-CM | POA: Diagnosis not present

## 2016-09-29 DIAGNOSIS — Z95828 Presence of other vascular implants and grafts: Secondary | ICD-10-CM | POA: Diagnosis not present

## 2016-09-29 DIAGNOSIS — R197 Diarrhea, unspecified: Secondary | ICD-10-CM | POA: Diagnosis not present

## 2016-09-30 ENCOUNTER — Other Ambulatory Visit (INDEPENDENT_AMBULATORY_CARE_PROVIDER_SITE_OTHER): Payer: Self-pay | Admitting: Internal Medicine

## 2016-10-01 DIAGNOSIS — Z95828 Presence of other vascular implants and grafts: Secondary | ICD-10-CM | POA: Diagnosis not present

## 2016-10-01 DIAGNOSIS — C189 Malignant neoplasm of colon, unspecified: Secondary | ICD-10-CM | POA: Diagnosis not present

## 2016-10-01 DIAGNOSIS — C787 Secondary malignant neoplasm of liver and intrahepatic bile duct: Secondary | ICD-10-CM | POA: Diagnosis not present

## 2016-10-01 DIAGNOSIS — R197 Diarrhea, unspecified: Secondary | ICD-10-CM | POA: Diagnosis not present

## 2016-10-03 DIAGNOSIS — Z95828 Presence of other vascular implants and grafts: Secondary | ICD-10-CM | POA: Diagnosis not present

## 2016-10-03 DIAGNOSIS — R197 Diarrhea, unspecified: Secondary | ICD-10-CM | POA: Diagnosis not present

## 2016-10-03 DIAGNOSIS — C189 Malignant neoplasm of colon, unspecified: Secondary | ICD-10-CM | POA: Diagnosis not present

## 2016-10-03 DIAGNOSIS — C787 Secondary malignant neoplasm of liver and intrahepatic bile duct: Secondary | ICD-10-CM | POA: Diagnosis not present

## 2016-10-08 DIAGNOSIS — C787 Secondary malignant neoplasm of liver and intrahepatic bile duct: Secondary | ICD-10-CM | POA: Diagnosis not present

## 2016-10-08 DIAGNOSIS — Z87891 Personal history of nicotine dependence: Secondary | ICD-10-CM | POA: Diagnosis not present

## 2016-10-08 DIAGNOSIS — Z809 Family history of malignant neoplasm, unspecified: Secondary | ICD-10-CM | POA: Diagnosis not present

## 2016-10-08 DIAGNOSIS — Z95828 Presence of other vascular implants and grafts: Secondary | ICD-10-CM | POA: Diagnosis not present

## 2016-10-08 DIAGNOSIS — C189 Malignant neoplasm of colon, unspecified: Secondary | ICD-10-CM | POA: Diagnosis not present

## 2016-10-09 DIAGNOSIS — I82811 Embolism and thrombosis of superficial veins of right lower extremities: Secondary | ICD-10-CM | POA: Diagnosis not present

## 2016-10-09 DIAGNOSIS — Z6820 Body mass index (BMI) 20.0-20.9, adult: Secondary | ICD-10-CM | POA: Diagnosis not present

## 2016-10-10 DIAGNOSIS — M479 Spondylosis, unspecified: Secondary | ICD-10-CM | POA: Diagnosis not present

## 2016-10-10 DIAGNOSIS — Z515 Encounter for palliative care: Secondary | ICD-10-CM | POA: Diagnosis not present

## 2016-10-10 DIAGNOSIS — C787 Secondary malignant neoplasm of liver and intrahepatic bile duct: Secondary | ICD-10-CM | POA: Diagnosis not present

## 2016-10-10 DIAGNOSIS — T451X5A Adverse effect of antineoplastic and immunosuppressive drugs, initial encounter: Secondary | ICD-10-CM | POA: Diagnosis not present

## 2016-10-10 DIAGNOSIS — I82811 Embolism and thrombosis of superficial veins of right lower extremities: Secondary | ICD-10-CM | POA: Diagnosis not present

## 2016-10-10 DIAGNOSIS — D519 Vitamin B12 deficiency anemia, unspecified: Secondary | ICD-10-CM | POA: Diagnosis not present

## 2016-10-10 DIAGNOSIS — K219 Gastro-esophageal reflux disease without esophagitis: Secondary | ICD-10-CM | POA: Diagnosis not present

## 2016-10-10 DIAGNOSIS — D6481 Anemia due to antineoplastic chemotherapy: Secondary | ICD-10-CM | POA: Diagnosis not present

## 2016-10-10 DIAGNOSIS — I447 Left bundle-branch block, unspecified: Secondary | ICD-10-CM | POA: Diagnosis not present

## 2016-10-10 DIAGNOSIS — R509 Fever, unspecified: Secondary | ICD-10-CM | POA: Diagnosis not present

## 2016-10-10 DIAGNOSIS — I82511 Chronic embolism and thrombosis of right femoral vein: Secondary | ICD-10-CM | POA: Diagnosis not present

## 2016-10-10 DIAGNOSIS — E1122 Type 2 diabetes mellitus with diabetic chronic kidney disease: Secondary | ICD-10-CM | POA: Diagnosis not present

## 2016-10-10 DIAGNOSIS — E039 Hypothyroidism, unspecified: Secondary | ICD-10-CM | POA: Diagnosis not present

## 2016-10-10 DIAGNOSIS — N189 Chronic kidney disease, unspecified: Secondary | ICD-10-CM | POA: Diagnosis not present

## 2016-10-10 DIAGNOSIS — M1611 Unilateral primary osteoarthritis, right hip: Secondary | ICD-10-CM | POA: Diagnosis not present

## 2016-10-10 DIAGNOSIS — R64 Cachexia: Secondary | ICD-10-CM | POA: Diagnosis not present

## 2016-10-10 DIAGNOSIS — F329 Major depressive disorder, single episode, unspecified: Secondary | ICD-10-CM | POA: Diagnosis not present

## 2016-10-10 DIAGNOSIS — G8929 Other chronic pain: Secondary | ICD-10-CM | POA: Diagnosis not present

## 2016-10-10 DIAGNOSIS — N2889 Other specified disorders of kidney and ureter: Secondary | ICD-10-CM | POA: Diagnosis not present

## 2016-10-10 DIAGNOSIS — I130 Hypertensive heart and chronic kidney disease with heart failure and stage 1 through stage 4 chronic kidney disease, or unspecified chronic kidney disease: Secondary | ICD-10-CM | POA: Diagnosis not present

## 2016-10-10 DIAGNOSIS — D509 Iron deficiency anemia, unspecified: Secondary | ICD-10-CM | POA: Diagnosis not present

## 2016-10-10 DIAGNOSIS — C189 Malignant neoplasm of colon, unspecified: Secondary | ICD-10-CM | POA: Diagnosis not present

## 2016-10-10 DIAGNOSIS — M40209 Unspecified kyphosis, site unspecified: Secondary | ICD-10-CM | POA: Diagnosis not present

## 2016-10-10 DIAGNOSIS — N6019 Diffuse cystic mastopathy of unspecified breast: Secondary | ICD-10-CM | POA: Diagnosis not present

## 2016-10-10 DIAGNOSIS — Z6820 Body mass index (BMI) 20.0-20.9, adult: Secondary | ICD-10-CM | POA: Diagnosis not present

## 2016-10-10 DIAGNOSIS — I5030 Unspecified diastolic (congestive) heart failure: Secondary | ICD-10-CM | POA: Diagnosis not present

## 2016-10-10 DIAGNOSIS — M79661 Pain in right lower leg: Secondary | ICD-10-CM | POA: Diagnosis not present

## 2016-10-10 DIAGNOSIS — I2510 Atherosclerotic heart disease of native coronary artery without angina pectoris: Secondary | ICD-10-CM | POA: Diagnosis not present

## 2016-10-14 DIAGNOSIS — C787 Secondary malignant neoplasm of liver and intrahepatic bile duct: Secondary | ICD-10-CM | POA: Diagnosis not present

## 2016-10-14 DIAGNOSIS — Z95828 Presence of other vascular implants and grafts: Secondary | ICD-10-CM | POA: Diagnosis not present

## 2016-10-14 DIAGNOSIS — R197 Diarrhea, unspecified: Secondary | ICD-10-CM | POA: Diagnosis not present

## 2016-10-14 DIAGNOSIS — C189 Malignant neoplasm of colon, unspecified: Secondary | ICD-10-CM | POA: Diagnosis not present

## 2016-10-17 DIAGNOSIS — C189 Malignant neoplasm of colon, unspecified: Secondary | ICD-10-CM | POA: Diagnosis not present

## 2016-10-17 DIAGNOSIS — E079 Disorder of thyroid, unspecified: Secondary | ICD-10-CM | POA: Diagnosis not present

## 2016-10-17 DIAGNOSIS — Z79899 Other long term (current) drug therapy: Secondary | ICD-10-CM | POA: Diagnosis not present

## 2016-10-17 DIAGNOSIS — C787 Secondary malignant neoplasm of liver and intrahepatic bile duct: Secondary | ICD-10-CM | POA: Diagnosis not present

## 2016-10-17 DIAGNOSIS — Z87891 Personal history of nicotine dependence: Secondary | ICD-10-CM | POA: Diagnosis not present

## 2016-10-17 DIAGNOSIS — I509 Heart failure, unspecified: Secondary | ICD-10-CM | POA: Diagnosis not present

## 2016-10-17 DIAGNOSIS — I110 Hypertensive heart disease with heart failure: Secondary | ICD-10-CM | POA: Diagnosis not present

## 2016-10-20 DIAGNOSIS — C189 Malignant neoplasm of colon, unspecified: Secondary | ICD-10-CM | POA: Diagnosis not present

## 2016-10-20 DIAGNOSIS — Z95828 Presence of other vascular implants and grafts: Secondary | ICD-10-CM | POA: Diagnosis not present

## 2016-10-20 DIAGNOSIS — C787 Secondary malignant neoplasm of liver and intrahepatic bile duct: Secondary | ICD-10-CM | POA: Diagnosis not present

## 2016-10-20 DIAGNOSIS — R197 Diarrhea, unspecified: Secondary | ICD-10-CM | POA: Diagnosis not present

## 2016-10-23 DIAGNOSIS — Z95828 Presence of other vascular implants and grafts: Secondary | ICD-10-CM | POA: Diagnosis not present

## 2016-10-23 DIAGNOSIS — C189 Malignant neoplasm of colon, unspecified: Secondary | ICD-10-CM | POA: Diagnosis not present

## 2016-10-23 DIAGNOSIS — C787 Secondary malignant neoplasm of liver and intrahepatic bile duct: Secondary | ICD-10-CM | POA: Diagnosis not present

## 2016-10-23 DIAGNOSIS — R197 Diarrhea, unspecified: Secondary | ICD-10-CM | POA: Diagnosis not present

## 2016-10-27 DIAGNOSIS — C787 Secondary malignant neoplasm of liver and intrahepatic bile duct: Secondary | ICD-10-CM | POA: Diagnosis not present

## 2016-10-27 DIAGNOSIS — I130 Hypertensive heart and chronic kidney disease with heart failure and stage 1 through stage 4 chronic kidney disease, or unspecified chronic kidney disease: Secondary | ICD-10-CM | POA: Diagnosis not present

## 2016-10-27 DIAGNOSIS — I2510 Atherosclerotic heart disease of native coronary artery without angina pectoris: Secondary | ICD-10-CM | POA: Diagnosis not present

## 2016-10-27 DIAGNOSIS — I82812 Embolism and thrombosis of superficial veins of left lower extremities: Secondary | ICD-10-CM | POA: Diagnosis not present

## 2016-10-27 DIAGNOSIS — E1122 Type 2 diabetes mellitus with diabetic chronic kidney disease: Secondary | ICD-10-CM | POA: Diagnosis not present

## 2016-10-27 DIAGNOSIS — I447 Left bundle-branch block, unspecified: Secondary | ICD-10-CM | POA: Diagnosis not present

## 2016-10-27 DIAGNOSIS — N183 Chronic kidney disease, stage 3 (moderate): Secondary | ICD-10-CM | POA: Diagnosis not present

## 2016-10-27 DIAGNOSIS — G8929 Other chronic pain: Secondary | ICD-10-CM | POA: Diagnosis not present

## 2016-10-27 DIAGNOSIS — I509 Heart failure, unspecified: Secondary | ICD-10-CM | POA: Diagnosis not present

## 2016-10-27 DIAGNOSIS — R197 Diarrhea, unspecified: Secondary | ICD-10-CM | POA: Diagnosis not present

## 2016-10-27 DIAGNOSIS — D6489 Other specified anemias: Secondary | ICD-10-CM | POA: Diagnosis not present

## 2016-10-27 DIAGNOSIS — Z932 Ileostomy status: Secondary | ICD-10-CM | POA: Diagnosis not present

## 2016-10-27 DIAGNOSIS — Z79891 Long term (current) use of opiate analgesic: Secondary | ICD-10-CM | POA: Diagnosis not present

## 2016-10-27 DIAGNOSIS — C7801 Secondary malignant neoplasm of right lung: Secondary | ICD-10-CM | POA: Diagnosis not present

## 2016-10-27 DIAGNOSIS — C189 Malignant neoplasm of colon, unspecified: Secondary | ICD-10-CM | POA: Diagnosis not present

## 2016-10-27 DIAGNOSIS — Z7982 Long term (current) use of aspirin: Secondary | ICD-10-CM | POA: Diagnosis not present

## 2016-10-27 DIAGNOSIS — M1611 Unilateral primary osteoarthritis, right hip: Secondary | ICD-10-CM | POA: Diagnosis not present

## 2016-10-27 DIAGNOSIS — Z452 Encounter for adjustment and management of vascular access device: Secondary | ICD-10-CM | POA: Diagnosis not present

## 2016-10-27 DIAGNOSIS — Z87891 Personal history of nicotine dependence: Secondary | ICD-10-CM | POA: Diagnosis not present

## 2016-10-29 DIAGNOSIS — C787 Secondary malignant neoplasm of liver and intrahepatic bile duct: Secondary | ICD-10-CM | POA: Diagnosis not present

## 2016-10-29 DIAGNOSIS — C189 Malignant neoplasm of colon, unspecified: Secondary | ICD-10-CM | POA: Diagnosis not present

## 2016-10-29 DIAGNOSIS — R197 Diarrhea, unspecified: Secondary | ICD-10-CM | POA: Diagnosis not present

## 2016-10-29 DIAGNOSIS — Z452 Encounter for adjustment and management of vascular access device: Secondary | ICD-10-CM | POA: Diagnosis not present

## 2016-10-29 DIAGNOSIS — E1122 Type 2 diabetes mellitus with diabetic chronic kidney disease: Secondary | ICD-10-CM | POA: Diagnosis not present

## 2016-10-29 DIAGNOSIS — I82812 Embolism and thrombosis of superficial veins of left lower extremities: Secondary | ICD-10-CM | POA: Diagnosis not present

## 2016-10-30 DIAGNOSIS — Z86718 Personal history of other venous thrombosis and embolism: Secondary | ICD-10-CM | POA: Diagnosis not present

## 2016-10-30 DIAGNOSIS — Z8052 Family history of malignant neoplasm of bladder: Secondary | ICD-10-CM | POA: Diagnosis not present

## 2016-10-30 DIAGNOSIS — Z452 Encounter for adjustment and management of vascular access device: Secondary | ICD-10-CM | POA: Diagnosis not present

## 2016-10-30 DIAGNOSIS — E860 Dehydration: Secondary | ICD-10-CM | POA: Diagnosis not present

## 2016-10-30 DIAGNOSIS — Z515 Encounter for palliative care: Secondary | ICD-10-CM | POA: Diagnosis not present

## 2016-10-30 DIAGNOSIS — Z7982 Long term (current) use of aspirin: Secondary | ICD-10-CM | POA: Diagnosis not present

## 2016-10-30 DIAGNOSIS — I2510 Atherosclerotic heart disease of native coronary artery without angina pectoris: Secondary | ICD-10-CM | POA: Diagnosis not present

## 2016-10-30 DIAGNOSIS — I447 Left bundle-branch block, unspecified: Secondary | ICD-10-CM | POA: Diagnosis not present

## 2016-10-30 DIAGNOSIS — R109 Unspecified abdominal pain: Secondary | ICD-10-CM | POA: Diagnosis not present

## 2016-10-30 DIAGNOSIS — N183 Chronic kidney disease, stage 3 (moderate): Secondary | ICD-10-CM | POA: Diagnosis not present

## 2016-10-30 DIAGNOSIS — K219 Gastro-esophageal reflux disease without esophagitis: Secondary | ICD-10-CM | POA: Diagnosis not present

## 2016-10-30 DIAGNOSIS — E538 Deficiency of other specified B group vitamins: Secondary | ICD-10-CM | POA: Diagnosis not present

## 2016-10-30 DIAGNOSIS — Z7901 Long term (current) use of anticoagulants: Secondary | ICD-10-CM | POA: Diagnosis not present

## 2016-10-30 DIAGNOSIS — I82812 Embolism and thrombosis of superficial veins of left lower extremities: Secondary | ICD-10-CM | POA: Diagnosis not present

## 2016-10-30 DIAGNOSIS — I130 Hypertensive heart and chronic kidney disease with heart failure and stage 1 through stage 4 chronic kidney disease, or unspecified chronic kidney disease: Secondary | ICD-10-CM | POA: Diagnosis not present

## 2016-10-30 DIAGNOSIS — E079 Disorder of thyroid, unspecified: Secondary | ICD-10-CM | POA: Diagnosis not present

## 2016-10-30 DIAGNOSIS — R197 Diarrhea, unspecified: Secondary | ICD-10-CM | POA: Diagnosis not present

## 2016-10-30 DIAGNOSIS — R112 Nausea with vomiting, unspecified: Secondary | ICD-10-CM | POA: Diagnosis not present

## 2016-10-30 DIAGNOSIS — M25551 Pain in right hip: Secondary | ICD-10-CM | POA: Diagnosis not present

## 2016-10-30 DIAGNOSIS — Z87891 Personal history of nicotine dependence: Secondary | ICD-10-CM | POA: Diagnosis not present

## 2016-10-30 DIAGNOSIS — I5020 Unspecified systolic (congestive) heart failure: Secondary | ICD-10-CM | POA: Diagnosis not present

## 2016-10-30 DIAGNOSIS — D6481 Anemia due to antineoplastic chemotherapy: Secondary | ICD-10-CM | POA: Diagnosis not present

## 2016-10-30 DIAGNOSIS — I471 Supraventricular tachycardia: Secondary | ICD-10-CM | POA: Diagnosis not present

## 2016-10-30 DIAGNOSIS — C787 Secondary malignant neoplasm of liver and intrahepatic bile duct: Secondary | ICD-10-CM | POA: Diagnosis not present

## 2016-10-30 DIAGNOSIS — C189 Malignant neoplasm of colon, unspecified: Secondary | ICD-10-CM | POA: Diagnosis not present

## 2016-10-30 DIAGNOSIS — E1122 Type 2 diabetes mellitus with diabetic chronic kidney disease: Secondary | ICD-10-CM | POA: Diagnosis not present

## 2016-10-31 DIAGNOSIS — K219 Gastro-esophageal reflux disease without esophagitis: Secondary | ICD-10-CM | POA: Diagnosis not present

## 2016-10-31 DIAGNOSIS — C189 Malignant neoplasm of colon, unspecified: Secondary | ICD-10-CM | POA: Diagnosis not present

## 2016-10-31 DIAGNOSIS — Z79899 Other long term (current) drug therapy: Secondary | ICD-10-CM | POA: Diagnosis not present

## 2016-10-31 DIAGNOSIS — Z452 Encounter for adjustment and management of vascular access device: Secondary | ICD-10-CM | POA: Diagnosis not present

## 2016-10-31 DIAGNOSIS — I82812 Embolism and thrombosis of superficial veins of left lower extremities: Secondary | ICD-10-CM | POA: Diagnosis not present

## 2016-10-31 DIAGNOSIS — Z8551 Personal history of malignant neoplasm of bladder: Secondary | ICD-10-CM | POA: Diagnosis not present

## 2016-10-31 DIAGNOSIS — E1122 Type 2 diabetes mellitus with diabetic chronic kidney disease: Secondary | ICD-10-CM | POA: Diagnosis not present

## 2016-10-31 DIAGNOSIS — I10 Essential (primary) hypertension: Secondary | ICD-10-CM | POA: Diagnosis not present

## 2016-10-31 DIAGNOSIS — R1013 Epigastric pain: Secondary | ICD-10-CM | POA: Diagnosis not present

## 2016-10-31 DIAGNOSIS — Z85038 Personal history of other malignant neoplasm of large intestine: Secondary | ICD-10-CM | POA: Diagnosis not present

## 2016-10-31 DIAGNOSIS — D649 Anemia, unspecified: Secondary | ICD-10-CM | POA: Diagnosis not present

## 2016-10-31 DIAGNOSIS — E039 Hypothyroidism, unspecified: Secondary | ICD-10-CM | POA: Diagnosis not present

## 2016-10-31 DIAGNOSIS — R197 Diarrhea, unspecified: Secondary | ICD-10-CM | POA: Diagnosis not present

## 2016-10-31 DIAGNOSIS — R079 Chest pain, unspecified: Secondary | ICD-10-CM | POA: Diagnosis not present

## 2016-10-31 DIAGNOSIS — Z87891 Personal history of nicotine dependence: Secondary | ICD-10-CM | POA: Diagnosis not present

## 2016-10-31 DIAGNOSIS — R109 Unspecified abdominal pain: Secondary | ICD-10-CM | POA: Diagnosis not present

## 2016-10-31 DIAGNOSIS — E778 Other disorders of glycoprotein metabolism: Secondary | ICD-10-CM | POA: Diagnosis not present

## 2016-10-31 DIAGNOSIS — C787 Secondary malignant neoplasm of liver and intrahepatic bile duct: Secondary | ICD-10-CM | POA: Diagnosis not present

## 2016-10-31 DIAGNOSIS — E119 Type 2 diabetes mellitus without complications: Secondary | ICD-10-CM | POA: Diagnosis not present

## 2016-10-31 DIAGNOSIS — G8929 Other chronic pain: Secondary | ICD-10-CM | POA: Diagnosis not present

## 2016-11-03 DIAGNOSIS — C787 Secondary malignant neoplasm of liver and intrahepatic bile duct: Secondary | ICD-10-CM | POA: Diagnosis not present

## 2016-11-03 DIAGNOSIS — E1122 Type 2 diabetes mellitus with diabetic chronic kidney disease: Secondary | ICD-10-CM | POA: Diagnosis not present

## 2016-11-03 DIAGNOSIS — Z452 Encounter for adjustment and management of vascular access device: Secondary | ICD-10-CM | POA: Diagnosis not present

## 2016-11-03 DIAGNOSIS — I82812 Embolism and thrombosis of superficial veins of left lower extremities: Secondary | ICD-10-CM | POA: Diagnosis not present

## 2016-11-03 DIAGNOSIS — R197 Diarrhea, unspecified: Secondary | ICD-10-CM | POA: Diagnosis not present

## 2016-11-03 DIAGNOSIS — C189 Malignant neoplasm of colon, unspecified: Secondary | ICD-10-CM | POA: Diagnosis not present

## 2016-11-04 DIAGNOSIS — C189 Malignant neoplasm of colon, unspecified: Secondary | ICD-10-CM | POA: Diagnosis not present

## 2016-11-04 DIAGNOSIS — C787 Secondary malignant neoplasm of liver and intrahepatic bile duct: Secondary | ICD-10-CM | POA: Diagnosis not present

## 2016-11-05 DIAGNOSIS — C787 Secondary malignant neoplasm of liver and intrahepatic bile duct: Secondary | ICD-10-CM | POA: Diagnosis not present

## 2016-11-05 DIAGNOSIS — E1122 Type 2 diabetes mellitus with diabetic chronic kidney disease: Secondary | ICD-10-CM | POA: Diagnosis not present

## 2016-11-05 DIAGNOSIS — C189 Malignant neoplasm of colon, unspecified: Secondary | ICD-10-CM | POA: Diagnosis not present

## 2016-11-05 DIAGNOSIS — Z452 Encounter for adjustment and management of vascular access device: Secondary | ICD-10-CM | POA: Diagnosis not present

## 2016-11-05 DIAGNOSIS — I82812 Embolism and thrombosis of superficial veins of left lower extremities: Secondary | ICD-10-CM | POA: Diagnosis not present

## 2016-11-05 DIAGNOSIS — R197 Diarrhea, unspecified: Secondary | ICD-10-CM | POA: Diagnosis not present

## 2016-11-07 DIAGNOSIS — C189 Malignant neoplasm of colon, unspecified: Secondary | ICD-10-CM | POA: Diagnosis not present

## 2016-11-07 DIAGNOSIS — C787 Secondary malignant neoplasm of liver and intrahepatic bile duct: Secondary | ICD-10-CM | POA: Diagnosis not present

## 2016-11-07 DIAGNOSIS — C7802 Secondary malignant neoplasm of left lung: Secondary | ICD-10-CM | POA: Diagnosis not present

## 2016-11-07 DIAGNOSIS — C7801 Secondary malignant neoplasm of right lung: Secondary | ICD-10-CM | POA: Diagnosis not present

## 2016-11-07 DIAGNOSIS — R1909 Other intra-abdominal and pelvic swelling, mass and lump: Secondary | ICD-10-CM | POA: Diagnosis not present

## 2016-11-10 ENCOUNTER — Other Ambulatory Visit (HOSPITAL_COMMUNITY): Payer: Self-pay | Admitting: Oncology

## 2016-11-10 DIAGNOSIS — R197 Diarrhea, unspecified: Secondary | ICD-10-CM | POA: Diagnosis not present

## 2016-11-10 DIAGNOSIS — I82812 Embolism and thrombosis of superficial veins of left lower extremities: Secondary | ICD-10-CM | POA: Diagnosis not present

## 2016-11-10 DIAGNOSIS — C189 Malignant neoplasm of colon, unspecified: Secondary | ICD-10-CM | POA: Diagnosis not present

## 2016-11-10 DIAGNOSIS — Z452 Encounter for adjustment and management of vascular access device: Secondary | ICD-10-CM | POA: Diagnosis not present

## 2016-11-10 DIAGNOSIS — C787 Secondary malignant neoplasm of liver and intrahepatic bile duct: Secondary | ICD-10-CM | POA: Diagnosis not present

## 2016-11-10 DIAGNOSIS — E1122 Type 2 diabetes mellitus with diabetic chronic kidney disease: Secondary | ICD-10-CM | POA: Diagnosis not present

## 2016-11-12 DIAGNOSIS — C787 Secondary malignant neoplasm of liver and intrahepatic bile duct: Secondary | ICD-10-CM | POA: Diagnosis not present

## 2016-11-12 DIAGNOSIS — I82812 Embolism and thrombosis of superficial veins of left lower extremities: Secondary | ICD-10-CM | POA: Diagnosis not present

## 2016-11-12 DIAGNOSIS — C189 Malignant neoplasm of colon, unspecified: Secondary | ICD-10-CM | POA: Diagnosis not present

## 2016-11-12 DIAGNOSIS — E1122 Type 2 diabetes mellitus with diabetic chronic kidney disease: Secondary | ICD-10-CM | POA: Diagnosis not present

## 2016-11-12 DIAGNOSIS — Z452 Encounter for adjustment and management of vascular access device: Secondary | ICD-10-CM | POA: Diagnosis not present

## 2016-11-12 DIAGNOSIS — R197 Diarrhea, unspecified: Secondary | ICD-10-CM | POA: Diagnosis not present

## 2016-11-18 DIAGNOSIS — Z452 Encounter for adjustment and management of vascular access device: Secondary | ICD-10-CM | POA: Diagnosis not present

## 2016-11-18 DIAGNOSIS — E1122 Type 2 diabetes mellitus with diabetic chronic kidney disease: Secondary | ICD-10-CM | POA: Diagnosis not present

## 2016-11-18 DIAGNOSIS — R197 Diarrhea, unspecified: Secondary | ICD-10-CM | POA: Diagnosis not present

## 2016-11-18 DIAGNOSIS — I82812 Embolism and thrombosis of superficial veins of left lower extremities: Secondary | ICD-10-CM | POA: Diagnosis not present

## 2016-11-18 DIAGNOSIS — C787 Secondary malignant neoplasm of liver and intrahepatic bile duct: Secondary | ICD-10-CM | POA: Diagnosis not present

## 2016-11-18 DIAGNOSIS — C189 Malignant neoplasm of colon, unspecified: Secondary | ICD-10-CM | POA: Diagnosis not present

## 2016-11-19 DIAGNOSIS — R197 Diarrhea, unspecified: Secondary | ICD-10-CM | POA: Diagnosis not present

## 2016-11-19 DIAGNOSIS — Z452 Encounter for adjustment and management of vascular access device: Secondary | ICD-10-CM | POA: Diagnosis not present

## 2016-11-19 DIAGNOSIS — E1122 Type 2 diabetes mellitus with diabetic chronic kidney disease: Secondary | ICD-10-CM | POA: Diagnosis not present

## 2016-11-19 DIAGNOSIS — C189 Malignant neoplasm of colon, unspecified: Secondary | ICD-10-CM | POA: Diagnosis not present

## 2016-11-19 DIAGNOSIS — I82812 Embolism and thrombosis of superficial veins of left lower extremities: Secondary | ICD-10-CM | POA: Diagnosis not present

## 2016-11-19 DIAGNOSIS — C787 Secondary malignant neoplasm of liver and intrahepatic bile duct: Secondary | ICD-10-CM | POA: Diagnosis not present

## 2016-11-21 DIAGNOSIS — R197 Diarrhea, unspecified: Secondary | ICD-10-CM | POA: Diagnosis not present

## 2016-11-21 DIAGNOSIS — C787 Secondary malignant neoplasm of liver and intrahepatic bile duct: Secondary | ICD-10-CM | POA: Diagnosis not present

## 2016-11-21 DIAGNOSIS — C189 Malignant neoplasm of colon, unspecified: Secondary | ICD-10-CM | POA: Diagnosis not present

## 2016-11-21 DIAGNOSIS — Z452 Encounter for adjustment and management of vascular access device: Secondary | ICD-10-CM | POA: Diagnosis not present

## 2016-11-21 DIAGNOSIS — I82812 Embolism and thrombosis of superficial veins of left lower extremities: Secondary | ICD-10-CM | POA: Diagnosis not present

## 2016-11-21 DIAGNOSIS — E1122 Type 2 diabetes mellitus with diabetic chronic kidney disease: Secondary | ICD-10-CM | POA: Diagnosis not present

## 2016-11-24 DIAGNOSIS — E1122 Type 2 diabetes mellitus with diabetic chronic kidney disease: Secondary | ICD-10-CM | POA: Diagnosis not present

## 2016-11-24 DIAGNOSIS — C189 Malignant neoplasm of colon, unspecified: Secondary | ICD-10-CM | POA: Diagnosis not present

## 2016-11-24 DIAGNOSIS — R197 Diarrhea, unspecified: Secondary | ICD-10-CM | POA: Diagnosis not present

## 2016-11-24 DIAGNOSIS — Z452 Encounter for adjustment and management of vascular access device: Secondary | ICD-10-CM | POA: Diagnosis not present

## 2016-11-24 DIAGNOSIS — I82812 Embolism and thrombosis of superficial veins of left lower extremities: Secondary | ICD-10-CM | POA: Diagnosis not present

## 2016-11-24 DIAGNOSIS — C787 Secondary malignant neoplasm of liver and intrahepatic bile duct: Secondary | ICD-10-CM | POA: Diagnosis not present

## 2016-11-26 DIAGNOSIS — R531 Weakness: Secondary | ICD-10-CM | POA: Diagnosis not present

## 2016-11-26 DIAGNOSIS — R52 Pain, unspecified: Secondary | ICD-10-CM | POA: Diagnosis not present

## 2016-11-26 DIAGNOSIS — R110 Nausea: Secondary | ICD-10-CM | POA: Diagnosis not present

## 2016-11-26 DIAGNOSIS — C787 Secondary malignant neoplasm of liver and intrahepatic bile duct: Secondary | ICD-10-CM | POA: Diagnosis not present

## 2016-11-26 DIAGNOSIS — Z452 Encounter for adjustment and management of vascular access device: Secondary | ICD-10-CM | POA: Diagnosis not present

## 2016-11-26 DIAGNOSIS — C189 Malignant neoplasm of colon, unspecified: Secondary | ICD-10-CM | POA: Diagnosis not present

## 2016-11-26 DIAGNOSIS — I82812 Embolism and thrombosis of superficial veins of left lower extremities: Secondary | ICD-10-CM | POA: Diagnosis not present

## 2016-11-26 DIAGNOSIS — E1122 Type 2 diabetes mellitus with diabetic chronic kidney disease: Secondary | ICD-10-CM | POA: Diagnosis not present

## 2016-11-26 DIAGNOSIS — R197 Diarrhea, unspecified: Secondary | ICD-10-CM | POA: Diagnosis not present

## 2016-11-28 DIAGNOSIS — E1122 Type 2 diabetes mellitus with diabetic chronic kidney disease: Secondary | ICD-10-CM | POA: Diagnosis not present

## 2016-11-28 DIAGNOSIS — Z452 Encounter for adjustment and management of vascular access device: Secondary | ICD-10-CM | POA: Diagnosis not present

## 2016-11-28 DIAGNOSIS — C787 Secondary malignant neoplasm of liver and intrahepatic bile duct: Secondary | ICD-10-CM | POA: Diagnosis not present

## 2016-11-28 DIAGNOSIS — C189 Malignant neoplasm of colon, unspecified: Secondary | ICD-10-CM | POA: Diagnosis not present

## 2016-11-28 DIAGNOSIS — I82812 Embolism and thrombosis of superficial veins of left lower extremities: Secondary | ICD-10-CM | POA: Diagnosis not present

## 2016-11-28 DIAGNOSIS — R197 Diarrhea, unspecified: Secondary | ICD-10-CM | POA: Diagnosis not present

## 2016-12-01 DIAGNOSIS — E1122 Type 2 diabetes mellitus with diabetic chronic kidney disease: Secondary | ICD-10-CM | POA: Diagnosis not present

## 2016-12-01 DIAGNOSIS — R197 Diarrhea, unspecified: Secondary | ICD-10-CM | POA: Diagnosis not present

## 2016-12-01 DIAGNOSIS — I82812 Embolism and thrombosis of superficial veins of left lower extremities: Secondary | ICD-10-CM | POA: Diagnosis not present

## 2016-12-01 DIAGNOSIS — C787 Secondary malignant neoplasm of liver and intrahepatic bile duct: Secondary | ICD-10-CM | POA: Diagnosis not present

## 2016-12-01 DIAGNOSIS — C189 Malignant neoplasm of colon, unspecified: Secondary | ICD-10-CM | POA: Diagnosis not present

## 2016-12-01 DIAGNOSIS — Z452 Encounter for adjustment and management of vascular access device: Secondary | ICD-10-CM | POA: Diagnosis not present

## 2016-12-03 DIAGNOSIS — Z452 Encounter for adjustment and management of vascular access device: Secondary | ICD-10-CM | POA: Diagnosis not present

## 2016-12-03 DIAGNOSIS — E1122 Type 2 diabetes mellitus with diabetic chronic kidney disease: Secondary | ICD-10-CM | POA: Diagnosis not present

## 2016-12-03 DIAGNOSIS — I82812 Embolism and thrombosis of superficial veins of left lower extremities: Secondary | ICD-10-CM | POA: Diagnosis not present

## 2016-12-03 DIAGNOSIS — C787 Secondary malignant neoplasm of liver and intrahepatic bile duct: Secondary | ICD-10-CM | POA: Diagnosis not present

## 2016-12-03 DIAGNOSIS — R197 Diarrhea, unspecified: Secondary | ICD-10-CM | POA: Diagnosis not present

## 2016-12-03 DIAGNOSIS — C189 Malignant neoplasm of colon, unspecified: Secondary | ICD-10-CM | POA: Diagnosis not present

## 2016-12-05 DIAGNOSIS — C189 Malignant neoplasm of colon, unspecified: Secondary | ICD-10-CM | POA: Diagnosis not present

## 2016-12-05 DIAGNOSIS — I82812 Embolism and thrombosis of superficial veins of left lower extremities: Secondary | ICD-10-CM | POA: Diagnosis not present

## 2016-12-05 DIAGNOSIS — C787 Secondary malignant neoplasm of liver and intrahepatic bile duct: Secondary | ICD-10-CM | POA: Diagnosis not present

## 2016-12-05 DIAGNOSIS — E1122 Type 2 diabetes mellitus with diabetic chronic kidney disease: Secondary | ICD-10-CM | POA: Diagnosis not present

## 2016-12-05 DIAGNOSIS — R197 Diarrhea, unspecified: Secondary | ICD-10-CM | POA: Diagnosis not present

## 2016-12-05 DIAGNOSIS — Z452 Encounter for adjustment and management of vascular access device: Secondary | ICD-10-CM | POA: Diagnosis not present

## 2016-12-08 DIAGNOSIS — R197 Diarrhea, unspecified: Secondary | ICD-10-CM | POA: Diagnosis not present

## 2016-12-08 DIAGNOSIS — C787 Secondary malignant neoplasm of liver and intrahepatic bile duct: Secondary | ICD-10-CM | POA: Diagnosis not present

## 2016-12-08 DIAGNOSIS — Z452 Encounter for adjustment and management of vascular access device: Secondary | ICD-10-CM | POA: Diagnosis not present

## 2016-12-08 DIAGNOSIS — I82812 Embolism and thrombosis of superficial veins of left lower extremities: Secondary | ICD-10-CM | POA: Diagnosis not present

## 2016-12-08 DIAGNOSIS — C189 Malignant neoplasm of colon, unspecified: Secondary | ICD-10-CM | POA: Diagnosis not present

## 2016-12-08 DIAGNOSIS — E1122 Type 2 diabetes mellitus with diabetic chronic kidney disease: Secondary | ICD-10-CM | POA: Diagnosis not present

## 2016-12-09 DIAGNOSIS — E118 Type 2 diabetes mellitus with unspecified complications: Secondary | ICD-10-CM | POA: Diagnosis not present

## 2016-12-09 DIAGNOSIS — R531 Weakness: Secondary | ICD-10-CM | POA: Diagnosis not present

## 2016-12-09 DIAGNOSIS — C189 Malignant neoplasm of colon, unspecified: Secondary | ICD-10-CM | POA: Diagnosis not present

## 2016-12-09 DIAGNOSIS — R630 Anorexia: Secondary | ICD-10-CM | POA: Diagnosis not present

## 2016-12-09 DIAGNOSIS — R112 Nausea with vomiting, unspecified: Secondary | ICD-10-CM | POA: Diagnosis not present

## 2016-12-09 DIAGNOSIS — I509 Heart failure, unspecified: Secondary | ICD-10-CM | POA: Diagnosis not present

## 2016-12-09 DIAGNOSIS — I10 Essential (primary) hypertension: Secondary | ICD-10-CM | POA: Diagnosis not present

## 2016-12-09 DIAGNOSIS — K219 Gastro-esophageal reflux disease without esophagitis: Secondary | ICD-10-CM | POA: Diagnosis not present

## 2016-12-09 DIAGNOSIS — N183 Chronic kidney disease, stage 3 (moderate): Secondary | ICD-10-CM | POA: Diagnosis not present

## 2016-12-09 DIAGNOSIS — R1310 Dysphagia, unspecified: Secondary | ICD-10-CM | POA: Diagnosis not present

## 2016-12-09 DIAGNOSIS — E039 Hypothyroidism, unspecified: Secondary | ICD-10-CM | POA: Diagnosis not present

## 2016-12-09 DIAGNOSIS — R0602 Shortness of breath: Secondary | ICD-10-CM | POA: Diagnosis not present

## 2016-12-10 DIAGNOSIS — R1310 Dysphagia, unspecified: Secondary | ICD-10-CM | POA: Diagnosis not present

## 2016-12-10 DIAGNOSIS — R531 Weakness: Secondary | ICD-10-CM | POA: Diagnosis not present

## 2016-12-10 DIAGNOSIS — R112 Nausea with vomiting, unspecified: Secondary | ICD-10-CM | POA: Diagnosis not present

## 2016-12-10 DIAGNOSIS — I10 Essential (primary) hypertension: Secondary | ICD-10-CM | POA: Diagnosis not present

## 2016-12-10 DIAGNOSIS — C189 Malignant neoplasm of colon, unspecified: Secondary | ICD-10-CM | POA: Diagnosis not present

## 2016-12-10 DIAGNOSIS — E118 Type 2 diabetes mellitus with unspecified complications: Secondary | ICD-10-CM | POA: Diagnosis not present

## 2016-12-11 DIAGNOSIS — I509 Heart failure, unspecified: Secondary | ICD-10-CM | POA: Diagnosis not present

## 2016-12-11 DIAGNOSIS — K219 Gastro-esophageal reflux disease without esophagitis: Secondary | ICD-10-CM | POA: Diagnosis not present

## 2016-12-11 DIAGNOSIS — N183 Chronic kidney disease, stage 3 (moderate): Secondary | ICD-10-CM | POA: Diagnosis not present

## 2016-12-11 DIAGNOSIS — I10 Essential (primary) hypertension: Secondary | ICD-10-CM | POA: Diagnosis not present

## 2016-12-11 DIAGNOSIS — R1310 Dysphagia, unspecified: Secondary | ICD-10-CM | POA: Diagnosis not present

## 2016-12-11 DIAGNOSIS — E118 Type 2 diabetes mellitus with unspecified complications: Secondary | ICD-10-CM | POA: Diagnosis not present

## 2016-12-11 DIAGNOSIS — C189 Malignant neoplasm of colon, unspecified: Secondary | ICD-10-CM | POA: Diagnosis not present

## 2016-12-11 DIAGNOSIS — R0602 Shortness of breath: Secondary | ICD-10-CM | POA: Diagnosis not present

## 2016-12-11 DIAGNOSIS — R531 Weakness: Secondary | ICD-10-CM | POA: Diagnosis not present

## 2016-12-11 DIAGNOSIS — E039 Hypothyroidism, unspecified: Secondary | ICD-10-CM | POA: Diagnosis not present

## 2016-12-11 DIAGNOSIS — R630 Anorexia: Secondary | ICD-10-CM | POA: Diagnosis not present

## 2016-12-11 DIAGNOSIS — R112 Nausea with vomiting, unspecified: Secondary | ICD-10-CM | POA: Diagnosis not present

## 2016-12-15 DIAGNOSIS — R531 Weakness: Secondary | ICD-10-CM | POA: Diagnosis not present

## 2016-12-15 DIAGNOSIS — C189 Malignant neoplasm of colon, unspecified: Secondary | ICD-10-CM | POA: Diagnosis not present

## 2016-12-15 DIAGNOSIS — R1310 Dysphagia, unspecified: Secondary | ICD-10-CM | POA: Diagnosis not present

## 2016-12-15 DIAGNOSIS — E118 Type 2 diabetes mellitus with unspecified complications: Secondary | ICD-10-CM | POA: Diagnosis not present

## 2016-12-15 DIAGNOSIS — R112 Nausea with vomiting, unspecified: Secondary | ICD-10-CM | POA: Diagnosis not present

## 2016-12-15 DIAGNOSIS — I10 Essential (primary) hypertension: Secondary | ICD-10-CM | POA: Diagnosis not present

## 2016-12-17 ENCOUNTER — Telehealth (INDEPENDENT_AMBULATORY_CARE_PROVIDER_SITE_OTHER): Payer: Self-pay | Admitting: Internal Medicine

## 2016-12-17 DIAGNOSIS — C189 Malignant neoplasm of colon, unspecified: Secondary | ICD-10-CM | POA: Diagnosis not present

## 2016-12-17 DIAGNOSIS — R1310 Dysphagia, unspecified: Secondary | ICD-10-CM | POA: Diagnosis not present

## 2016-12-17 DIAGNOSIS — E118 Type 2 diabetes mellitus with unspecified complications: Secondary | ICD-10-CM | POA: Diagnosis not present

## 2016-12-17 DIAGNOSIS — I10 Essential (primary) hypertension: Secondary | ICD-10-CM | POA: Diagnosis not present

## 2016-12-17 DIAGNOSIS — R531 Weakness: Secondary | ICD-10-CM | POA: Diagnosis not present

## 2016-12-17 DIAGNOSIS — R112 Nausea with vomiting, unspecified: Secondary | ICD-10-CM | POA: Diagnosis not present

## 2016-12-17 NOTE — Telephone Encounter (Signed)
The patient's daughter Altamese Dilling called to cancel her mothers appointment, stated she is in Hospice care and she wanted to let him know.

## 2016-12-17 NOTE — Telephone Encounter (Signed)
Noted , and will make D.Rehman aware.

## 2016-12-22 DIAGNOSIS — R1310 Dysphagia, unspecified: Secondary | ICD-10-CM | POA: Diagnosis not present

## 2016-12-22 DIAGNOSIS — C189 Malignant neoplasm of colon, unspecified: Secondary | ICD-10-CM | POA: Diagnosis not present

## 2016-12-22 DIAGNOSIS — R112 Nausea with vomiting, unspecified: Secondary | ICD-10-CM | POA: Diagnosis not present

## 2016-12-22 DIAGNOSIS — E118 Type 2 diabetes mellitus with unspecified complications: Secondary | ICD-10-CM | POA: Diagnosis not present

## 2016-12-22 DIAGNOSIS — I10 Essential (primary) hypertension: Secondary | ICD-10-CM | POA: Diagnosis not present

## 2016-12-22 DIAGNOSIS — R531 Weakness: Secondary | ICD-10-CM | POA: Diagnosis not present

## 2016-12-23 ENCOUNTER — Ambulatory Visit (INDEPENDENT_AMBULATORY_CARE_PROVIDER_SITE_OTHER): Payer: Medicare Other | Admitting: Internal Medicine

## 2016-12-24 DIAGNOSIS — C189 Malignant neoplasm of colon, unspecified: Secondary | ICD-10-CM | POA: Diagnosis not present

## 2016-12-24 DIAGNOSIS — E118 Type 2 diabetes mellitus with unspecified complications: Secondary | ICD-10-CM | POA: Diagnosis not present

## 2016-12-24 DIAGNOSIS — R531 Weakness: Secondary | ICD-10-CM | POA: Diagnosis not present

## 2016-12-24 DIAGNOSIS — R1310 Dysphagia, unspecified: Secondary | ICD-10-CM | POA: Diagnosis not present

## 2016-12-24 DIAGNOSIS — R112 Nausea with vomiting, unspecified: Secondary | ICD-10-CM | POA: Diagnosis not present

## 2016-12-24 DIAGNOSIS — I10 Essential (primary) hypertension: Secondary | ICD-10-CM | POA: Diagnosis not present

## 2016-12-25 DIAGNOSIS — I10 Essential (primary) hypertension: Secondary | ICD-10-CM | POA: Diagnosis not present

## 2016-12-25 DIAGNOSIS — R1310 Dysphagia, unspecified: Secondary | ICD-10-CM | POA: Diagnosis not present

## 2016-12-25 DIAGNOSIS — E118 Type 2 diabetes mellitus with unspecified complications: Secondary | ICD-10-CM | POA: Diagnosis not present

## 2016-12-25 DIAGNOSIS — R112 Nausea with vomiting, unspecified: Secondary | ICD-10-CM | POA: Diagnosis not present

## 2016-12-25 DIAGNOSIS — C189 Malignant neoplasm of colon, unspecified: Secondary | ICD-10-CM | POA: Diagnosis not present

## 2016-12-25 DIAGNOSIS — R531 Weakness: Secondary | ICD-10-CM | POA: Diagnosis not present

## 2016-12-29 DIAGNOSIS — R1310 Dysphagia, unspecified: Secondary | ICD-10-CM | POA: Diagnosis not present

## 2016-12-29 DIAGNOSIS — C189 Malignant neoplasm of colon, unspecified: Secondary | ICD-10-CM | POA: Diagnosis not present

## 2016-12-29 DIAGNOSIS — I10 Essential (primary) hypertension: Secondary | ICD-10-CM | POA: Diagnosis not present

## 2016-12-29 DIAGNOSIS — R112 Nausea with vomiting, unspecified: Secondary | ICD-10-CM | POA: Diagnosis not present

## 2016-12-29 DIAGNOSIS — R531 Weakness: Secondary | ICD-10-CM | POA: Diagnosis not present

## 2016-12-29 DIAGNOSIS — E118 Type 2 diabetes mellitus with unspecified complications: Secondary | ICD-10-CM | POA: Diagnosis not present

## 2016-12-31 DIAGNOSIS — I10 Essential (primary) hypertension: Secondary | ICD-10-CM | POA: Diagnosis not present

## 2016-12-31 DIAGNOSIS — E118 Type 2 diabetes mellitus with unspecified complications: Secondary | ICD-10-CM | POA: Diagnosis not present

## 2016-12-31 DIAGNOSIS — C189 Malignant neoplasm of colon, unspecified: Secondary | ICD-10-CM | POA: Diagnosis not present

## 2016-12-31 DIAGNOSIS — R112 Nausea with vomiting, unspecified: Secondary | ICD-10-CM | POA: Diagnosis not present

## 2016-12-31 DIAGNOSIS — R531 Weakness: Secondary | ICD-10-CM | POA: Diagnosis not present

## 2016-12-31 DIAGNOSIS — R1310 Dysphagia, unspecified: Secondary | ICD-10-CM | POA: Diagnosis not present

## 2017-01-05 DIAGNOSIS — E118 Type 2 diabetes mellitus with unspecified complications: Secondary | ICD-10-CM | POA: Diagnosis not present

## 2017-01-05 DIAGNOSIS — R1310 Dysphagia, unspecified: Secondary | ICD-10-CM | POA: Diagnosis not present

## 2017-01-05 DIAGNOSIS — R112 Nausea with vomiting, unspecified: Secondary | ICD-10-CM | POA: Diagnosis not present

## 2017-01-05 DIAGNOSIS — I10 Essential (primary) hypertension: Secondary | ICD-10-CM | POA: Diagnosis not present

## 2017-01-05 DIAGNOSIS — R531 Weakness: Secondary | ICD-10-CM | POA: Diagnosis not present

## 2017-01-05 DIAGNOSIS — C189 Malignant neoplasm of colon, unspecified: Secondary | ICD-10-CM | POA: Diagnosis not present

## 2017-01-06 DIAGNOSIS — R112 Nausea with vomiting, unspecified: Secondary | ICD-10-CM | POA: Diagnosis not present

## 2017-01-06 DIAGNOSIS — R1310 Dysphagia, unspecified: Secondary | ICD-10-CM | POA: Diagnosis not present

## 2017-01-06 DIAGNOSIS — R531 Weakness: Secondary | ICD-10-CM | POA: Diagnosis not present

## 2017-01-06 DIAGNOSIS — I10 Essential (primary) hypertension: Secondary | ICD-10-CM | POA: Diagnosis not present

## 2017-01-06 DIAGNOSIS — E118 Type 2 diabetes mellitus with unspecified complications: Secondary | ICD-10-CM | POA: Diagnosis not present

## 2017-01-06 DIAGNOSIS — C189 Malignant neoplasm of colon, unspecified: Secondary | ICD-10-CM | POA: Diagnosis not present

## 2017-01-07 DIAGNOSIS — I10 Essential (primary) hypertension: Secondary | ICD-10-CM | POA: Diagnosis not present

## 2017-01-07 DIAGNOSIS — R112 Nausea with vomiting, unspecified: Secondary | ICD-10-CM | POA: Diagnosis not present

## 2017-01-07 DIAGNOSIS — R531 Weakness: Secondary | ICD-10-CM | POA: Diagnosis not present

## 2017-01-07 DIAGNOSIS — C189 Malignant neoplasm of colon, unspecified: Secondary | ICD-10-CM | POA: Diagnosis not present

## 2017-01-07 DIAGNOSIS — E118 Type 2 diabetes mellitus with unspecified complications: Secondary | ICD-10-CM | POA: Diagnosis not present

## 2017-01-07 DIAGNOSIS — R1310 Dysphagia, unspecified: Secondary | ICD-10-CM | POA: Diagnosis not present

## 2017-01-08 DIAGNOSIS — I10 Essential (primary) hypertension: Secondary | ICD-10-CM | POA: Diagnosis not present

## 2017-01-08 DIAGNOSIS — R112 Nausea with vomiting, unspecified: Secondary | ICD-10-CM | POA: Diagnosis not present

## 2017-01-08 DIAGNOSIS — E118 Type 2 diabetes mellitus with unspecified complications: Secondary | ICD-10-CM | POA: Diagnosis not present

## 2017-01-08 DIAGNOSIS — R1310 Dysphagia, unspecified: Secondary | ICD-10-CM | POA: Diagnosis not present

## 2017-01-08 DIAGNOSIS — C189 Malignant neoplasm of colon, unspecified: Secondary | ICD-10-CM | POA: Diagnosis not present

## 2017-01-08 DIAGNOSIS — R531 Weakness: Secondary | ICD-10-CM | POA: Diagnosis not present

## 2017-01-09 DIAGNOSIS — I10 Essential (primary) hypertension: Secondary | ICD-10-CM | POA: Diagnosis not present

## 2017-01-09 DIAGNOSIS — C189 Malignant neoplasm of colon, unspecified: Secondary | ICD-10-CM | POA: Diagnosis not present

## 2017-01-09 DIAGNOSIS — R531 Weakness: Secondary | ICD-10-CM | POA: Diagnosis not present

## 2017-01-09 DIAGNOSIS — E118 Type 2 diabetes mellitus with unspecified complications: Secondary | ICD-10-CM | POA: Diagnosis not present

## 2017-01-09 DIAGNOSIS — R1310 Dysphagia, unspecified: Secondary | ICD-10-CM | POA: Diagnosis not present

## 2017-01-09 DIAGNOSIS — R112 Nausea with vomiting, unspecified: Secondary | ICD-10-CM | POA: Diagnosis not present

## 2017-01-10 DIAGNOSIS — E039 Hypothyroidism, unspecified: Secondary | ICD-10-CM | POA: Diagnosis not present

## 2017-01-10 DIAGNOSIS — N183 Chronic kidney disease, stage 3 (moderate): Secondary | ICD-10-CM | POA: Diagnosis not present

## 2017-01-10 DIAGNOSIS — I10 Essential (primary) hypertension: Secondary | ICD-10-CM | POA: Diagnosis not present

## 2017-01-10 DIAGNOSIS — C189 Malignant neoplasm of colon, unspecified: Secondary | ICD-10-CM | POA: Diagnosis not present

## 2017-01-10 DIAGNOSIS — R1310 Dysphagia, unspecified: Secondary | ICD-10-CM | POA: Diagnosis not present

## 2017-01-10 DIAGNOSIS — R0602 Shortness of breath: Secondary | ICD-10-CM | POA: Diagnosis not present

## 2017-01-10 DIAGNOSIS — R630 Anorexia: Secondary | ICD-10-CM | POA: Diagnosis not present

## 2017-01-10 DIAGNOSIS — R531 Weakness: Secondary | ICD-10-CM | POA: Diagnosis not present

## 2017-01-10 DIAGNOSIS — E118 Type 2 diabetes mellitus with unspecified complications: Secondary | ICD-10-CM | POA: Diagnosis not present

## 2017-01-10 DIAGNOSIS — I509 Heart failure, unspecified: Secondary | ICD-10-CM | POA: Diagnosis not present

## 2017-01-10 DIAGNOSIS — K219 Gastro-esophageal reflux disease without esophagitis: Secondary | ICD-10-CM | POA: Diagnosis not present

## 2017-01-10 DIAGNOSIS — R112 Nausea with vomiting, unspecified: Secondary | ICD-10-CM | POA: Diagnosis not present

## 2017-01-11 DIAGNOSIS — R531 Weakness: Secondary | ICD-10-CM | POA: Diagnosis not present

## 2017-01-11 DIAGNOSIS — R1310 Dysphagia, unspecified: Secondary | ICD-10-CM | POA: Diagnosis not present

## 2017-01-11 DIAGNOSIS — I10 Essential (primary) hypertension: Secondary | ICD-10-CM | POA: Diagnosis not present

## 2017-01-11 DIAGNOSIS — R112 Nausea with vomiting, unspecified: Secondary | ICD-10-CM | POA: Diagnosis not present

## 2017-01-11 DIAGNOSIS — E118 Type 2 diabetes mellitus with unspecified complications: Secondary | ICD-10-CM | POA: Diagnosis not present

## 2017-01-11 DIAGNOSIS — C189 Malignant neoplasm of colon, unspecified: Secondary | ICD-10-CM | POA: Diagnosis not present

## 2017-01-12 DIAGNOSIS — R112 Nausea with vomiting, unspecified: Secondary | ICD-10-CM | POA: Diagnosis not present

## 2017-01-12 DIAGNOSIS — C189 Malignant neoplasm of colon, unspecified: Secondary | ICD-10-CM | POA: Diagnosis not present

## 2017-01-12 DIAGNOSIS — E118 Type 2 diabetes mellitus with unspecified complications: Secondary | ICD-10-CM | POA: Diagnosis not present

## 2017-01-12 DIAGNOSIS — R1310 Dysphagia, unspecified: Secondary | ICD-10-CM | POA: Diagnosis not present

## 2017-01-12 DIAGNOSIS — R531 Weakness: Secondary | ICD-10-CM | POA: Diagnosis not present

## 2017-01-12 DIAGNOSIS — I10 Essential (primary) hypertension: Secondary | ICD-10-CM | POA: Diagnosis not present

## 2017-02-10 DEATH — deceased

## 2017-07-04 ENCOUNTER — Encounter (HOSPITAL_COMMUNITY): Payer: Self-pay | Admitting: Hematology & Oncology
